# Patient Record
Sex: Female | Born: 1965
Health system: Southern US, Community
[De-identification: ages and names within clinical notes are randomized; demographics above are authoritative.]

## PROBLEM LIST (undated history)

## (undated) DIAGNOSIS — Z8719 Personal history of other diseases of the digestive system: Secondary | ICD-10-CM

## (undated) DIAGNOSIS — Z79891 Long term (current) use of opiate analgesic: Secondary | ICD-10-CM

## (undated) DIAGNOSIS — R490 Dysphonia: Secondary | ICD-10-CM

## (undated) DIAGNOSIS — N390 Urinary tract infection, site not specified: Secondary | ICD-10-CM

## (undated) DIAGNOSIS — F1090 Alcohol use, unspecified, uncomplicated: Secondary | ICD-10-CM

## (undated) DIAGNOSIS — N179 Acute kidney failure, unspecified: Secondary | ICD-10-CM

## (undated) DIAGNOSIS — J9601 Acute respiratory failure with hypoxia: Secondary | ICD-10-CM

## (undated) DIAGNOSIS — N952 Postmenopausal atrophic vaginitis: Secondary | ICD-10-CM

## (undated) DIAGNOSIS — Z789 Other specified health status: Secondary | ICD-10-CM

## (undated) DIAGNOSIS — M542 Cervicalgia: Secondary | ICD-10-CM

## (undated) DIAGNOSIS — M19011 Primary osteoarthritis, right shoulder: Secondary | ICD-10-CM

## (undated) DIAGNOSIS — Z7289 Other problems related to lifestyle: Secondary | ICD-10-CM

## (undated) DIAGNOSIS — F329 Major depressive disorder, single episode, unspecified: Secondary | ICD-10-CM

## (undated) DIAGNOSIS — F1920 Other psychoactive substance dependence, uncomplicated: Secondary | ICD-10-CM

## (undated) DIAGNOSIS — M25521 Pain in right elbow: Secondary | ICD-10-CM

## (undated) DIAGNOSIS — G8929 Other chronic pain: Secondary | ICD-10-CM

## (undated) DIAGNOSIS — M25511 Pain in right shoulder: Secondary | ICD-10-CM

## (undated) DIAGNOSIS — Z981 Arthrodesis status: Secondary | ICD-10-CM

## (undated) DIAGNOSIS — B373 Candidiasis of vulva and vagina: Secondary | ICD-10-CM

## (undated) DIAGNOSIS — R451 Restlessness and agitation: Secondary | ICD-10-CM

## (undated) DIAGNOSIS — I10 Essential (primary) hypertension: Secondary | ICD-10-CM

## (undated) DIAGNOSIS — K631 Perforation of intestine (nontraumatic): Secondary | ICD-10-CM

## (undated) DIAGNOSIS — D62 Acute posthemorrhagic anemia: Secondary | ICD-10-CM

## (undated) DIAGNOSIS — Z9889 Other specified postprocedural states: Secondary | ICD-10-CM

## (undated) DIAGNOSIS — K658 Other peritonitis: Secondary | ICD-10-CM

## (undated) DIAGNOSIS — K750 Abscess of liver: Secondary | ICD-10-CM

## (undated) DIAGNOSIS — Z8619 Personal history of other infectious and parasitic diseases: Secondary | ICD-10-CM

## (undated) DIAGNOSIS — M75121 Complete rotator cuff tear or rupture of right shoulder, not specified as traumatic: Secondary | ICD-10-CM

## (undated) DIAGNOSIS — J690 Pneumonitis due to inhalation of food and vomit: Secondary | ICD-10-CM

## (undated) DIAGNOSIS — R070 Pain in throat: Secondary | ICD-10-CM

## (undated) DIAGNOSIS — Z8709 Personal history of other diseases of the respiratory system: Secondary | ICD-10-CM

## (undated) DIAGNOSIS — M75101 Unspecified rotator cuff tear or rupture of right shoulder, not specified as traumatic: Secondary | ICD-10-CM

## (undated) DIAGNOSIS — R0989 Other specified symptoms and signs involving the circulatory and respiratory systems: Secondary | ICD-10-CM

## (undated) DIAGNOSIS — K219 Gastro-esophageal reflux disease without esophagitis: Secondary | ICD-10-CM

## (undated) DIAGNOSIS — M47812 Spondylosis without myelopathy or radiculopathy, cervical region: Secondary | ICD-10-CM

## (undated) DIAGNOSIS — D649 Anemia, unspecified: Secondary | ICD-10-CM

## (undated) DIAGNOSIS — Z86010 Personal history of colonic polyps: Secondary | ICD-10-CM

## (undated) DIAGNOSIS — R079 Chest pain, unspecified: Secondary | ICD-10-CM

## (undated) DIAGNOSIS — K5790 Diverticulosis of intestine, part unspecified, without perforation or abscess without bleeding: Secondary | ICD-10-CM

## (undated) DIAGNOSIS — M19019 Primary osteoarthritis, unspecified shoulder: Secondary | ICD-10-CM

## (undated) DIAGNOSIS — Z0000 Encounter for general adult medical examination without abnormal findings: Secondary | ICD-10-CM

## (undated) DIAGNOSIS — K626 Ulcer of anus and rectum: Secondary | ICD-10-CM

## (undated) DIAGNOSIS — F32A Depression, unspecified: Secondary | ICD-10-CM

## (undated) DIAGNOSIS — F17200 Nicotine dependence, unspecified, uncomplicated: Secondary | ICD-10-CM

## (undated) DIAGNOSIS — H9190 Unspecified hearing loss, unspecified ear: Secondary | ICD-10-CM

## (undated) HISTORY — DX: Unspecified hearing loss, unspecified ear: H91.90

## (undated) HISTORY — DX: Arthrodesis status: Z98.1

## (undated) HISTORY — DX: Nicotine dependence, unspecified, uncomplicated: F17.200

## (undated) HISTORY — DX: Alcohol use, unspecified, uncomplicated: F10.90

## (undated) HISTORY — DX: Other problems related to lifestyle: Z72.89

## (undated) HISTORY — DX: Complete rotator cuff tear or rupture of right shoulder, not specified as traumatic: M75.121

## (undated) HISTORY — DX: Essential (primary) hypertension: I10

## (undated) HISTORY — DX: Primary osteoarthritis, unspecified shoulder: M19.019

## (undated) HISTORY — DX: Depression, unspecified: F32.A

## (undated) HISTORY — DX: Cervicalgia: M54.2

## (undated) HISTORY — DX: Major depressive disorder, single episode, unspecified: F32.9

## (undated) HISTORY — PX: SHOULDER ARTHROSCOPY: SHX128

## (undated) HISTORY — DX: Unspecified rotator cuff tear or rupture of right shoulder, not specified as traumatic: M75.101

## (undated) HISTORY — DX: Restlessness and agitation: R45.1

## (undated) HISTORY — DX: Chest pain, unspecified: R07.9

## (undated) HISTORY — DX: Other psychoactive substance dependence, uncomplicated: F19.20

## (undated) HISTORY — DX: Gastro-esophageal reflux disease without esophagitis: K21.9

## (undated) HISTORY — DX: Other specified symptoms and signs involving the circulatory and respiratory systems: R09.89

## (undated) HISTORY — DX: Candidiasis of vulva and vagina: B37.3

## (undated) HISTORY — DX: Dysphonia: R49.0

## (undated) HISTORY — DX: Pain in throat: R07.0

## (undated) HISTORY — DX: Primary osteoarthritis, right shoulder: M19.011

## (undated) HISTORY — PX: TUBAL LIGATION: SHX77

## (undated) HISTORY — DX: Encounter for general adult medical examination without abnormal findings: Z00.00

## (undated) HISTORY — DX: Spondylosis without myelopathy or radiculopathy, cervical region: M47.812

## (undated) HISTORY — DX: Long term (current) use of opiate analgesic: Z79.891

## (undated) HISTORY — DX: Pain in right elbow: M25.521

## (undated) HISTORY — DX: Other specified health status: Z78.9

## (undated) HISTORY — DX: Personal history of colonic polyps: Z86.010

## (undated) HISTORY — DX: Urinary tract infection, site not specified: N39.0

## (undated) HISTORY — DX: Postmenopausal atrophic vaginitis: N95.2

## (undated) HISTORY — DX: Other specified postprocedural states: Z98.890

## (undated) HISTORY — DX: Other chronic pain: G89.29

## (undated) HISTORY — DX: Pain in right shoulder: M25.511

---
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
## 2002-04-01 ENCOUNTER — Encounter (INDEPENDENT_AMBULATORY_CARE_PROVIDER_SITE_OTHER): Payer: Self-pay | Admitting: *Deleted

## 2002-04-01 ENCOUNTER — Encounter: Admission: RE | Admit: 2002-04-01 | Discharge: 2002-04-01 | Payer: Self-pay | Admitting: Obstetrics and Gynecology

## 2005-01-24 ENCOUNTER — Encounter (INDEPENDENT_AMBULATORY_CARE_PROVIDER_SITE_OTHER): Payer: Self-pay | Admitting: *Deleted

## 2005-01-24 ENCOUNTER — Ambulatory Visit: Payer: Self-pay | Admitting: Family Medicine

## 2005-01-30 ENCOUNTER — Encounter (INDEPENDENT_AMBULATORY_CARE_PROVIDER_SITE_OTHER): Payer: Self-pay | Admitting: Internal Medicine

## 2005-01-30 LAB — CONVERTED CEMR LAB: Pap Smear: NORMAL

## 2005-05-30 ENCOUNTER — Ambulatory Visit: Payer: Self-pay | Admitting: Obstetrics and Gynecology

## 2005-07-22 ENCOUNTER — Ambulatory Visit: Payer: Self-pay | Admitting: Internal Medicine

## 2005-07-26 ENCOUNTER — Ambulatory Visit (HOSPITAL_COMMUNITY): Admission: RE | Admit: 2005-07-26 | Discharge: 2005-07-26 | Payer: Self-pay

## 2005-08-12 ENCOUNTER — Ambulatory Visit: Payer: Self-pay | Admitting: Internal Medicine

## 2005-10-03 ENCOUNTER — Encounter: Admission: RE | Admit: 2005-10-03 | Discharge: 2005-10-03 | Payer: Self-pay | Admitting: Urology

## 2005-12-16 ENCOUNTER — Ambulatory Visit: Payer: Self-pay | Admitting: Hospitalist

## 2005-12-16 ENCOUNTER — Ambulatory Visit (HOSPITAL_COMMUNITY): Admission: RE | Admit: 2005-12-16 | Discharge: 2005-12-16 | Payer: Self-pay | Admitting: Hospitalist

## 2005-12-27 ENCOUNTER — Ambulatory Visit: Payer: Self-pay | Admitting: Internal Medicine

## 2005-12-30 ENCOUNTER — Ambulatory Visit: Payer: Self-pay | Admitting: Internal Medicine

## 2006-01-02 ENCOUNTER — Ambulatory Visit: Payer: Self-pay | Admitting: Family Medicine

## 2006-01-02 ENCOUNTER — Encounter (INDEPENDENT_AMBULATORY_CARE_PROVIDER_SITE_OTHER): Payer: Self-pay | Admitting: Internal Medicine

## 2006-01-02 ENCOUNTER — Ambulatory Visit (HOSPITAL_COMMUNITY): Admission: RE | Admit: 2006-01-02 | Discharge: 2006-01-02 | Payer: Self-pay | Admitting: Internal Medicine

## 2006-05-30 LAB — HM MAMMOGRAPHY: HM Mammogram: NEGATIVE

## 2006-06-06 ENCOUNTER — Ambulatory Visit: Payer: Self-pay | Admitting: Gynecology

## 2006-06-06 ENCOUNTER — Encounter (INDEPENDENT_AMBULATORY_CARE_PROVIDER_SITE_OTHER): Payer: Self-pay | Admitting: Internal Medicine

## 2006-06-06 ENCOUNTER — Encounter (INDEPENDENT_AMBULATORY_CARE_PROVIDER_SITE_OTHER): Payer: Self-pay | Admitting: Specialist

## 2006-06-06 LAB — CONVERTED CEMR LAB: Pap Smear: NORMAL

## 2006-07-02 ENCOUNTER — Encounter (INDEPENDENT_AMBULATORY_CARE_PROVIDER_SITE_OTHER): Payer: Self-pay | Admitting: Internal Medicine

## 2006-07-02 DIAGNOSIS — F17200 Nicotine dependence, unspecified, uncomplicated: Secondary | ICD-10-CM

## 2008-08-30 ENCOUNTER — Emergency Department (HOSPITAL_COMMUNITY): Admission: EM | Admit: 2008-08-30 | Discharge: 2008-08-30 | Payer: Self-pay | Admitting: Emergency Medicine

## 2009-04-21 ENCOUNTER — Emergency Department (HOSPITAL_COMMUNITY): Admission: EM | Admit: 2009-04-21 | Discharge: 2009-04-21 | Payer: Self-pay | Admitting: Emergency Medicine

## 2009-04-24 ENCOUNTER — Emergency Department (HOSPITAL_COMMUNITY): Admission: EM | Admit: 2009-04-24 | Discharge: 2009-04-24 | Payer: Self-pay | Admitting: Emergency Medicine

## 2009-05-02 ENCOUNTER — Encounter: Payer: Self-pay | Admitting: Internal Medicine

## 2009-05-02 ENCOUNTER — Ambulatory Visit: Payer: Self-pay | Admitting: Infectious Diseases

## 2009-05-02 ENCOUNTER — Ambulatory Visit (HOSPITAL_COMMUNITY): Admission: RE | Admit: 2009-05-02 | Discharge: 2009-05-02 | Payer: Self-pay | Admitting: Infectious Diseases

## 2009-05-02 LAB — CONVERTED CEMR LAB
BUN: 15 mg/dL (ref 6–23)
CO2: 26 meq/L (ref 19–32)
Calcium: 9.5 mg/dL (ref 8.4–10.5)
Chloride: 107 meq/L (ref 96–112)
Hemoglobin: 14.8 g/dL (ref 12.0–15.0)
MCHC: 32.8 g/dL (ref 30.0–36.0)
MCV: 99.3 fL (ref >=78.0)
RBC: 4.54 M/uL (ref 3.87–5.11)
Sodium: 142 meq/L (ref 135–145)
WBC: 5.9 10*3/uL (ref 4.0–10.5)

## 2009-05-04 ENCOUNTER — Ambulatory Visit: Payer: Self-pay | Admitting: Sports Medicine

## 2009-05-09 ENCOUNTER — Telehealth: Payer: Self-pay | Admitting: Sports Medicine

## 2009-05-18 ENCOUNTER — Ambulatory Visit: Payer: Self-pay | Admitting: Internal Medicine

## 2009-05-18 ENCOUNTER — Encounter: Payer: Self-pay | Admitting: Sports Medicine

## 2009-05-31 ENCOUNTER — Encounter: Admission: RE | Admit: 2009-05-31 | Discharge: 2009-06-28 | Payer: Self-pay | Admitting: Sports Medicine

## 2009-06-14 ENCOUNTER — Encounter: Payer: Self-pay | Admitting: Sports Medicine

## 2009-06-28 ENCOUNTER — Encounter: Payer: Self-pay | Admitting: Sports Medicine

## 2009-06-28 ENCOUNTER — Ambulatory Visit: Payer: Self-pay | Admitting: Internal Medicine

## 2009-06-29 ENCOUNTER — Ambulatory Visit: Payer: Self-pay | Admitting: Sports Medicine

## 2009-06-29 DIAGNOSIS — M75101 Unspecified rotator cuff tear or rupture of right shoulder, not specified as traumatic: Secondary | ICD-10-CM | POA: Insufficient documentation

## 2009-06-29 HISTORY — DX: Unspecified rotator cuff tear or rupture of right shoulder, not specified as traumatic: M75.101

## 2009-07-17 ENCOUNTER — Ambulatory Visit: Payer: Self-pay | Admitting: Sports Medicine

## 2009-07-17 DIAGNOSIS — F329 Major depressive disorder, single episode, unspecified: Secondary | ICD-10-CM

## 2009-08-04 ENCOUNTER — Encounter: Payer: Self-pay | Admitting: Sports Medicine

## 2009-09-01 ENCOUNTER — Encounter: Payer: Self-pay | Admitting: Sports Medicine

## 2009-09-14 ENCOUNTER — Encounter
Admission: RE | Admit: 2009-09-14 | Discharge: 2009-09-18 | Payer: Self-pay | Admitting: Physical Medicine & Rehabilitation

## 2009-09-18 ENCOUNTER — Ambulatory Visit: Payer: Self-pay | Admitting: Physical Medicine & Rehabilitation

## 2009-09-26 ENCOUNTER — Ambulatory Visit: Payer: Self-pay | Admitting: Sports Medicine

## 2009-09-26 ENCOUNTER — Encounter (INDEPENDENT_AMBULATORY_CARE_PROVIDER_SITE_OTHER): Payer: Self-pay | Admitting: *Deleted

## 2009-10-23 ENCOUNTER — Ambulatory Visit: Payer: Self-pay | Admitting: Family Medicine

## 2009-11-07 ENCOUNTER — Ambulatory Visit: Payer: Self-pay | Admitting: Sports Medicine

## 2009-12-06 ENCOUNTER — Ambulatory Visit: Payer: Self-pay | Admitting: Sports Medicine

## 2010-01-09 ENCOUNTER — Ambulatory Visit: Payer: Self-pay | Admitting: Sports Medicine

## 2010-01-30 ENCOUNTER — Emergency Department (HOSPITAL_COMMUNITY): Admission: EM | Admit: 2010-01-30 | Discharge: 2010-01-30 | Payer: Self-pay | Admitting: Emergency Medicine

## 2010-02-20 ENCOUNTER — Ambulatory Visit: Payer: Self-pay | Admitting: Sports Medicine

## 2010-03-09 ENCOUNTER — Ambulatory Visit: Payer: Self-pay | Admitting: Internal Medicine

## 2010-03-12 ENCOUNTER — Ambulatory Visit: Payer: Self-pay | Admitting: Sports Medicine

## 2010-03-14 ENCOUNTER — Ambulatory Visit (HOSPITAL_COMMUNITY): Admission: RE | Admit: 2010-03-14 | Discharge: 2010-03-14 | Payer: Self-pay | Admitting: Internal Medicine

## 2010-03-16 ENCOUNTER — Telehealth: Payer: Self-pay | Admitting: *Deleted

## 2010-03-21 ENCOUNTER — Ambulatory Visit: Payer: Self-pay | Admitting: Internal Medicine

## 2010-03-23 ENCOUNTER — Telehealth: Payer: Self-pay | Admitting: *Deleted

## 2010-03-26 ENCOUNTER — Telehealth: Payer: Self-pay | Admitting: Internal Medicine

## 2010-04-03 ENCOUNTER — Telehealth: Payer: Self-pay | Admitting: *Deleted

## 2010-04-05 ENCOUNTER — Telehealth: Payer: Self-pay | Admitting: Internal Medicine

## 2010-04-09 ENCOUNTER — Ambulatory Visit: Payer: Self-pay | Admitting: Internal Medicine

## 2010-04-09 ENCOUNTER — Encounter: Payer: Self-pay | Admitting: Internal Medicine

## 2010-04-17 ENCOUNTER — Telehealth: Payer: Self-pay | Admitting: Internal Medicine

## 2010-04-18 ENCOUNTER — Ambulatory Visit: Payer: Self-pay | Admitting: Internal Medicine

## 2010-04-27 ENCOUNTER — Telehealth: Payer: Self-pay | Admitting: Internal Medicine

## 2010-05-01 ENCOUNTER — Ambulatory Visit: Payer: Self-pay | Admitting: Sports Medicine

## 2010-05-07 ENCOUNTER — Ambulatory Visit: Payer: Self-pay | Admitting: Internal Medicine

## 2010-05-11 ENCOUNTER — Ambulatory Visit: Payer: Self-pay | Admitting: Sports Medicine

## 2010-05-23 ENCOUNTER — Encounter (INDEPENDENT_AMBULATORY_CARE_PROVIDER_SITE_OTHER): Payer: Self-pay | Admitting: *Deleted

## 2010-05-28 ENCOUNTER — Encounter: Payer: Self-pay | Admitting: Internal Medicine

## 2010-05-31 ENCOUNTER — Ambulatory Visit: Payer: Self-pay | Admitting: Sports Medicine

## 2010-06-06 ENCOUNTER — Telehealth: Payer: Self-pay | Admitting: Internal Medicine

## 2010-06-11 ENCOUNTER — Ambulatory Visit: Payer: Self-pay | Admitting: Internal Medicine

## 2010-06-15 ENCOUNTER — Telehealth: Payer: Self-pay | Admitting: Licensed Clinical Social Worker

## 2010-06-26 ENCOUNTER — Encounter: Payer: Self-pay | Admitting: Internal Medicine

## 2010-07-04 ENCOUNTER — Telehealth: Payer: Self-pay | Admitting: Licensed Clinical Social Worker

## 2010-07-13 ENCOUNTER — Ambulatory Visit: Payer: Self-pay | Admitting: Internal Medicine

## 2010-07-13 ENCOUNTER — Encounter: Payer: Self-pay | Admitting: Sports Medicine

## 2010-07-13 ENCOUNTER — Encounter: Payer: Self-pay | Admitting: Internal Medicine

## 2010-07-13 LAB — CONVERTED CEMR LAB
AST: 21 units/L (ref 0–37)
BUN: 20 mg/dL (ref 6–23)
Basophils Relative: 0 % (ref 0–1)
CO2: 26 meq/L (ref 19–32)
Chloride: 105 meq/L (ref 96–112)
Cholesterol: 191 mg/dL (ref 0–200)
Creatinine, Ser: 1.04 mg/dL (ref 0.40–1.20)
HDL: 88 mg/dL (ref >39)
Hemoglobin: 13.2 g/dL (ref 12.0–15.0)
Lymphocytes Relative: 39 % (ref 12–46)
Lymphs Abs: 2.6 10*3/uL (ref 0.7–4.0)
Monocytes Absolute: 0.5 10*3/uL (ref 0.1–1.0)
Monocytes Relative: 8 % (ref 3–12)
Neutro Abs: 3.5 10*3/uL (ref 1.7–7.7)
Potassium: 4.6 meq/L (ref 3.5–5.3)
RBC: 3.94 M/uL (ref 3.87–5.11)
RDW: 14.5 % (ref 11.5–15.5)
TSH: 1.84 microintl units/mL (ref 0.350–4.5)
Total CHOL/HDL Ratio: 2.2
Triglycerides: 47 mg/dL (ref <150)

## 2010-07-16 ENCOUNTER — Encounter: Payer: Self-pay | Admitting: Internal Medicine

## 2010-07-20 ENCOUNTER — Emergency Department (HOSPITAL_COMMUNITY): Admission: EM | Admit: 2010-07-20 | Discharge: 2010-07-20 | Payer: Self-pay | Admitting: Emergency Medicine

## 2010-07-24 ENCOUNTER — Encounter: Payer: Self-pay | Admitting: Internal Medicine

## 2010-07-31 ENCOUNTER — Telehealth: Payer: Self-pay | Admitting: Internal Medicine

## 2010-08-02 ENCOUNTER — Encounter: Payer: Self-pay | Admitting: Internal Medicine

## 2010-08-06 ENCOUNTER — Encounter: Payer: Self-pay | Admitting: Internal Medicine

## 2010-08-06 ENCOUNTER — Ambulatory Visit: Payer: Self-pay | Admitting: Internal Medicine

## 2010-08-09 ENCOUNTER — Encounter: Payer: Self-pay | Admitting: Internal Medicine

## 2010-08-13 ENCOUNTER — Encounter: Payer: Self-pay | Admitting: Internal Medicine

## 2010-08-21 ENCOUNTER — Telehealth: Payer: Self-pay | Admitting: Internal Medicine

## 2010-08-26 HISTORY — PX: CERVICAL DISCECTOMY: SHX98

## 2010-08-27 ENCOUNTER — Ambulatory Visit: Payer: Self-pay | Admitting: Internal Medicine

## 2010-09-04 ENCOUNTER — Encounter: Payer: Self-pay | Admitting: Internal Medicine

## 2010-09-12 ENCOUNTER — Encounter: Payer: Self-pay | Admitting: Internal Medicine

## 2010-09-15 ENCOUNTER — Encounter: Payer: Self-pay | Admitting: Gastroenterology

## 2010-09-16 ENCOUNTER — Encounter: Payer: Self-pay | Admitting: Infectious Diseases

## 2010-09-16 ENCOUNTER — Encounter: Payer: Self-pay | Admitting: Internal Medicine

## 2010-09-16 ENCOUNTER — Encounter: Payer: Self-pay | Admitting: Urology

## 2010-09-25 NOTE — Assessment & Plan Note (Signed)
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  note pain down to mid portion of periscapulr is now absent and RT arm not really painful  Allergies: No Known Drug Allergies  Physical Exam  General:  looks stressed and somewhat depressed not comfortable in collar Msk:  hands are both slt puffy w mild chronic djd changes in DIP jts pressure on left carpal tunnel does cause tingling in mid fingers  MSK [place] LT median nerve is 13 cm2 area whereas that on RT is about 10 cm2 in same area  trans and long scan both suggest changes in left median nerve   Impression & Recommendations:  Problem # 1:  WRIST PAIN, LEFT (ICD-719.43)  Orders: Thumbkeeper/wrist brace (W1191) [place] LIMITED ([id number]) Trigger Point Injection Single Tendon Origin/Insertion (56213)  will start using night splint to lessen carp tunn sxs  Problem # 2:  CARPAL TUNNEL SYNDROME, LEFT (ICD-354.0)  cleanse with alcohol Topical analgesic spray : Ethyl choride Joint left wrist Approached in typical fashion with: median wrist crese and directed under palmar ligament Completed without difficulty Meds:0.4cc kenalog 10 and 0.4 cc lidocaine Needle: 25 g and 1.5 in Aftercare instructions and Red flags advised  she meets [place] criteria for diagnosis of carpal tunnel based on median n volume wil ck to see if this approach has benefit and provide pain relief  if so may wish to keep using nite splints for  several mos  reck pending MRI findings  Orders: [place] LIMITED ([id number]) Trigger Point Injection Single Tendon Origin/Insertion (84696) Kenalog 10 mg inj (J3301)  Complete Medication List: 1)  Tramadol Hcl 50 Mg Tabs (Tramadol hcl) .... Take 1 tablet every 6 hour as needed. 2)  Gabapentin 800 Mg Tabs (Gabapentin) .Marland Kitchen.. 1 tab by mouth three times a day 3)  Amitriptyline Hcl 50 Mg Tabs (Amitriptyline hcl) .... 2 by mouth at bedtime 4)  Tramadol Hcl 50 Mg Tabs (Tramadol hcl) .... One to two po qid 5)  Doxycycline Hyclate 100 Mg Caps (Doxycycline hyclate) .... Take 1 tablet by mouth two times a day 6)  Bupropion Hcl 100 Mg Tabs (Bupropion hcl) .... Start with 1 tab two times a day and after 3 days take 1 tab three times a day
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
## 2010-09-25 NOTE — Assessment & Plan Note (Signed)
Summary: F/U,MC   Vital Signs:  Patient profile:   45 year old female BP sitting:   130 / 91  Vitals Entered By: [name] CMA (December 06, 2009 8:48 AM)  Primary Provider:  Lars Mage MD   History of Present Illness: Pain pattern starts at Trap and moves around shoulder blade Tens unit does help wears most days and uses as needed  Morning takes tramadol and gabapentin Takes amitriptyline and diazepam at night during day you use tramadol and gabapentin about every 4 hours total of 3 gabapentin  feels strength difference to RT arm anything more than 10 lbs causes significant pain  Allergies: No Known Drug Allergies  Physical Exam  General:  Well-developed,well-nourished,in no acute distress; alert,appropriate and cooperative throughout examination  seems in less pain than usual Msk:  RT shoulder only mild trap spasm today good ROM in abductiona nd elevation although she feels this at extremes this is normal ER is normal but uncomfortable with elevation  IR is limited onbackscratch and cross over  strenth is actually good in all planes but limited in IR 2/2 creating pain  neg impingement   Impression & Recommendations:  Problem # 1:  SHOULDER PAIN, RIGHT (ICD-719.41) Assessment Improved  The following medications were removed from the medication list:    Mobic 15 Mg Tabs (Meloxicam) .Marland Kitchen... Take one qam with food Her updated medication list for this problem includes:    Tramadol Hcl 50 Mg Tabs (Tramadol hcl) .Marland Kitchen... Take 1 tablet every 6 hour as needed.    Tramadol Hcl 50 Mg Tabs (Tramadol hcl) ..... One to two po qid   pain still gets to 7/10 at times but overall level is 3 to 4 most time this is better but still problematic  Problem # 2:  INJURY TO BRACHIAL PLEXUS (ICD-953.4) Assessment: Unchanged no nerve deficits noted x scapular dysfunction on RT  cont to follow but wean gabapentin  Problem # 3:  DEPRESSIVE DISORDER NOT ELSEWHERE CLASSIFIED  (ICD-311)  Her updated medication list for this problem includes:    Amitriptyline Hcl 50 Mg Tabs (Amitriptyline hcl) .Marland Kitchen... 2 by mouth at bedtime    Diazepam 5 Mg Tabs (Diazepam) ..... One tab three times a day po  I think meds are helping make this some better as sleep pattern has improved  will use diazepam but primarily as muscle relaxant  Complete Medication List: 1)  Tramadol Hcl 50 Mg Tabs (Tramadol hcl) .... Take 1 tablet every 6 hour as needed. 2)  Gabapentin 600 Mg Tabs (Gabapentin) .... One by mouth three times a day 3)  Amitriptyline Hcl 50 Mg Tabs (Amitriptyline hcl) .... 2 by mouth at bedtime 4)  Tramadol Hcl 50 Mg Tabs (Tramadol hcl) .... One to two po qid 5)  Diazepam 5 Mg Tabs (Diazepam) .... One tab three times a day po  Patient Instructions: 1)  Week one take: 2)  Diazepam 5 mgm morning and night 3)  Gabapentin one in mid day and night 4)  Week two 5)  Diazepam three times a day 6)  gabapentin just at night 7)  Weeks 3 and 4 8)  Diazepam three times a day 9)  Gabapentin every other day for 3 doses then stop 10)  stay on this and see me in 6 weeks Prescriptions: DIAZEPAM 5 MG TABS (DIAZEPAM) One tab three times a day po  #90 x 2   Entered and Authorized by:   Enid Baas MD   Signed by:  Enid Baas MD on 12/06/2009   Method used:   Print then Give to Patient   RxID:   [id number]

## 2010-09-25 NOTE — Assessment & Plan Note (Signed)
Summary: F/U STUDY,MC   Vital Signs:  Patient profile:   45 year old female Pulse rate:   64 / minute BP sitting:   141 / 84  Vitals Entered By: [name] CMA (September 26, 2009 10:22 AM)  Primary Provider:  Lars Mage MD   History of Present Illness: Patient comes in with gradual reduction in pain For a long time pain severity was 8 to 9 of 10 now after therapy and medicine averages 6 of 10 sleepiong thru nite since increased dose of amitriptyline tramdol usually helpful but often needs 2 of 50 mg tabs gabapentin helpful but very expensive for her  now getting better movement of RT arm Evaluation by PMR did not show any nerve damage to brachial plexus However, mechanism of injury and persistence of sxs still make this a possibility with secondary mm spasm the result interstingly had bilat carpal tunnel suggested by median nerve results but does not have sxs of hand pain or numbness PMR suggested a myofascial origin of pain  Continues to work exercises given by PT and the motion exercises that I added  Allergies: No Known Drug Allergies  Physical Exam  General:  Well-developed,well-nourished,in no acute distress; alert,appropriate and cooperative throughout examination Msk:  Moderate trapezius and infraspinatus spasm on RT that is not as prominent RT shoulder abduciton and elevation now to 150 deg and I can passively get 165 vs 180 on left  RT backscratch now to about T8 level was at T 10   no atrophy good overall strength  augmented phalen's test on both wrists does not produce any numbness or tingling in hands   Impression & Recommendations:  Problem # 1:  SHOULDER PAIN, RIGHT (ICD-719.41)  The following medications were removed from the medication list:    Tramadol Hcl 100 Mg Xr24h-tab (Tramadol hcl) .Marland Kitchen... Take one tab three times a day Her updated medication list for this problem includes:        Tramadol Hcl 50 Mg Tabs (Tramadol hcl) ..... One to two po  qid   cleanse with alcohol Topical analgesic spray : Ethyl choride Local injection along infraspinatus MM and along lower trapezius Approached in typical fashion and injected 5 separate trigger areas Completed without difficulty Meds:1% xylocaine 5 ccs Needle:25 G and 3/4 in Aftercare instructions and Red flags advised   now able to lift 8 to 10 lbs  reck in 6 wks  Orders: Trigger Point Injection (1 or 2 muscles) (16109)  Problem # 2:  INJURY TO BRACHIAL PLEXUS (ICD-953.4) suspect this was a neurapraxia that has resolved by time of NCV Muscle spasm may be residual  keep up gabapentin 300 to 600 tid  Problem # 3:  DEPRESSIVE DISORDER NOT ELSEWHERE CLASSIFIED (ICD-311)  Her updated medication list for this problem includes:    Amitriptyline Hcl 50 Mg Tabs (Amitriptyline hcl) .Marland Kitchen... 1 by mouth at bedtime x 1 week then 2 by mouth at bedtime  cont amitriptyline she will prob feel better if she can return to light duty work and letter given to this effect  Complete Medication List: 1)  Tramadol Hcl 50 Mg Tabs (Tramadol hcl) .... Take 1 tablet every 6 hour as needed. 2)  Gabapentin 600 Mg Tabs (Gabapentin) .... One by mouth three times a day 3)  Mobic 15 Mg Tabs (Meloxicam) .... Take one qam with food 4)  Amitriptyline Hcl 50 Mg Tabs (Amitriptyline hcl) .Marland Kitchen.. 1 by mouth at bedtime x 1 week then 2 by mouth qhs  5)  Tramadol Hcl 50 Mg Tabs (Tramadol hcl) .... One to two po qid Prescriptions: GABAPENTIN 600 MG TABS (GABAPENTIN) ONE by mouth three times a day  #90 x 3   Entered by:   [name] CMA   Authorized by:   Enid Baas MD   Signed by:   [name] CMA on 09/26/2009   Method used:   Print then Give to Patient   RxID:   [id number] TRAMADOL HCL 50 MG TABS (TRAMADOL HCL) ONE TO TWO PO QID  #100 x 3   Entered by:   [name] CMA   Authorized by:   Enid Baas MD   Signed by:   [name] CMA on 09/26/2009   Method used:   Print then Give to Patient   RxID:    [id number] GABAPENTIN 600 MG TABS (GABAPENTIN) ONE by mouth three times a day  #90 x 3   Entered by:   [name] CMA   Authorized by:   Enid Baas MD   Signed by:   [name] CMA on 09/26/2009   Method used:   Electronically to        [facility] (retail)       [address]       [place], [place]  [address]       Ph: [phone]       Fax: [phone]   RxID:   [id number]
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
## 2010-09-25 NOTE — Progress Notes (Signed)
Summary: med refil/gp  Phone Note Refill Request Message from:  Fax from Pharmacy on June 06, 2010 9:58 AM  Refills Requested: Medication #1:  SERTRALINE HCL 25 MG TABS Take 1 tablet by mouth once a day at night.   Dosage confirmed as above?Dosage Confirmed   Brand Name Necessary? No   Supply Requested: 3 months   Last Refilled: 05/07/2010  Method Requested: Electronic Initial call taken by: Chinita Pester RN,  June 06, 2010 9:59 AM  Follow-up for Phone Call        Refill approved and sent electronically to her pharmacy.  Thank you Follow-up by: Lars Mage MD,  June 06, 2010 1:49 PM    Prescriptions: SERTRALINE HCL 25 MG TABS (SERTRALINE HCL) Take 1 tablet by mouth once a day at night  #30 x 3   Entered and Authorized by:   Lars Mage MD   Signed by:   Lars Mage MD on 06/06/2010   Method used:   Electronically to        [facility] (retail)       [address]       [place], [place]  [address]       Ph: [phone]       Fax: [phone]   RxID:   [id number]
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
## 2010-09-25 NOTE — Miscellaneous (Signed)
Summary: PROTECTED HEALTH INFORMATION  PROTECTED HEALTH INFORMATION   Imported By: Margie Billet 05/10/2010 12:28:51  _____________________________________________________________________  External Attachment:    Type:   Image     Comment:   External Document

## 2010-09-25 NOTE — Assessment & Plan Note (Signed)
Summary: FU APPT/MJD   Vital Signs:  Patient profile:   45 year old female BP sitting:   120 / 70  Vitals Entered By: [name] CMA (Jan 09, 2010 8:52 AM)  Primary Provider:  Lars Mage MD   History of Present Illness: MEDS We have adjusted to following and these have taken burning our of shoulder area Gabapentin and tramadol in morning and takes this every 4 hours during day diazepam 5 on same schedule  amitriptyline 1 at nite adn 2 on weekend 2 makes her too drowsy  keep problem is contintued problems into left arm sometimes just to thumb dangling forward or inversion table relieves this for about 1 hour  Allergies: No Known Drug Allergies  Physical Exam  General:  Well-developed,well-nourished,in no acute distress; alert,appropriate and cooperative throughout examination  note she has good sitting and standing posture Neck:  full ROM wiothout pain Msk:  has left trap spasm  Bilat Shoulder Inspection reveals no abnormalities or assymetry; no atrophy noted; palpation is unremarkable;  ROM is full in all planes. specific strength testing of Rotator cuff mm reveals good strength throughout; no signs of impingement; speeds and yergason's tests normal;  no labral pathology noted; norm scapular function observed.  negative painful arc and no drop arm sign.  Neurologic:  no neuro deficits noted although she feels tingling into left arm on testing   Impression & Recommendations:  Problem # 1:  INJURY TO BRACHIAL PLEXUS (ICD-953.4) Still feel that this is primary trigger for her sxs as we do not identify any cervical spine or intrinsic shoulder injury on exam  see isntrucitons  will work with her to find ways to relieve nerve impingement sxs  Problem # 2:  BACK PAIN, THORACIC REGION (ICD-724.1)  Her updated medication list for this problem includes:    Tramadol Hcl 50 Mg Tabs (Tramadol hcl) .Marland Kitchen... Take 1 tablet every 6 hour as needed.    Tramadol Hcl 50 Mg Tabs  (Tramadol hcl) ..... One to two po qid  This is improving and will cont on meds as now  Complete Medication List: 1)  Tramadol Hcl 50 Mg Tabs (Tramadol hcl) .... Take 1 tablet every 6 hour as needed. 2)  Gabapentin 600 Mg Tabs (Gabapentin) .... One by mouth three times a day 3)  Amitriptyline Hcl 50 Mg Tabs (Amitriptyline hcl) .... 2 by mouth at bedtime 4)  Tramadol Hcl 50 Mg Tabs (Tramadol hcl) .... One to two po qid 5)  Diazepam 5 Mg Tabs (Diazepam) .... One tab three times a day po  Other Orders: Cervical Foam Collar Non Adjustable ([id number])  Patient Instructions: 1)  Let's try putting collar on for 1 hour at a time when you first feel tingling starting in left arm 2)  every 3 to four hours try this exercise - lean forward and move arm quickly back and forth 3)  Rt arm keep pushing up behind back 4)  leave meds as is for now 5)  reck with me in 2 mos or sooner if needed
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
## 2010-09-25 NOTE — Letter (Signed)
Summary: Out of Work  [facility]  [address]   [place], [place] [address]   Phone: [phone]  Fax: [phone]    September 26, 2009   Employee:  Janann Colonel    To Whom It May Concern:   For Medical reasons, please excuse the above named employee from lifting over 10 lbs while at work. If you need additional information, please feel free to contact our office.         Sincerely,    Sibyl Parr. Darrick Penna, M.D.
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
## 2010-09-25 NOTE — Letter (Signed)
Summary: [facility] [employer]  [facility] [employer]   Imported By: Margie Billet 07/27/2010 15:43:41  _____________________________________________________________________  External Attachment:    Type:   Image     Comment:   External Document
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
## 2010-09-25 NOTE — Progress Notes (Signed)
  Phone Note Outgoing Call   Call placed by: Theotis Barrio NT II,  April 03, 2010 4:54 PM Call placed to: Specialist Details for Reason: PT REFERRAL Summary of Call: SPOKE WITH Bradly Bienenstock AT PT OFFICE, TO FOLLOW UP ON THE REFERRAL FAXED ON 7/20/011 . KISHA SAID THE OFFICE LEFT A VOICE MESSAGE FOR PATIENT TO CALL THE OFFICE TO SET UP THIS APPT. PATIEINT NEVER CALLED THEIR OFFICE TO MAKE THIS APPT.
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
## 2010-09-25 NOTE — Progress Notes (Signed)
Summary: referral/ hla  Phone Note Call from Patient   Summary of Call: pt called to check on progress of her referral, she states she was told she would hear from "them" today and has not, i spoke w/ spowers and [place] and informed pt again that it would be monday at the earliest Initial call taken by: Marin Roberts RN,  March 23, 2010 4:33 PM

## 2010-09-25 NOTE — Progress Notes (Signed)
Summary: call for results/ hla  Phone Note Call from Patient   Summary of Call: pt called for mri results, transferred to kaye/ dr garg Initial call taken by: Marin Roberts RN,  March 16, 2010 3:55 PM     Appended Document: call for results/ hla Talked to Julie Williams about her MRI results. Told her that we have already started working on the Neurosurgery referral and sent all her information to [facility] and are waiting to hear back from them. She has an appointment with [place] on coming wednesday and further plan of action will be discussed that day.  Appended Document: call for results/ hla I left message noting she may have to call you for some adjustment in pain medications and to help with the disk.  she may want to try using the cervical collar I gave her again to see if this gives her some pain relief.       KBF

## 2010-09-25 NOTE — Consult Note (Signed)
Summary: [facility]   Imported By: Louretta Parma 07/02/2010 14:17:55  _____________________________________________________________________  External Attachment:    Type:   Image     Comment:   External Document
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
## 2010-09-25 NOTE — Progress Notes (Signed)
Summary: Shoulder pain  Phone Note Call from Patient   Caller: Patient Call For: Lars Mage MD Summary of Call: Call from pt said that she had a different type of pain in her shoulder.  York Spaniel that it felt like a shocking pain that went from her left shoulder tot her left arm.  The pain lasted about 15 minutes.   Wanted Dr. Eben Burow to know about this.  RTC to pt message left that the Clinics had returned her call. Initial call taken by: Angelina Ok RN,  March 26, 2010 11:02 AM  Follow-up for Phone Call        RTC from pt still having numbness in her hand.  Was referred to [facility] for this.  Wants to know status of.  Ongoing numbness and tingling.  Coming from bolts in her neck.    Additional Follow-up for Phone Call Additional follow up Details #1::        Patient was seen on Thursday last week and di not have any danger signs s/o nerve compression. We are still waiting for reply from [facility]. I have asked Purnell Shoemaker to look into the referral and call Ms Bernard to update. If the pain is unbearable, we should make an appointment for assesment for pain control Vs Admission. Additional Follow-up by: Lars Mage MD,  March 26, 2010 1:39 PM    Additional Follow-up for Phone Call Additional follow up Details #2::    F/u call made to [facility]  [phone], no appt has been scheduled at time.  Pt's information is on Michelle's desk who is in the process of making pt an appt. Message left on pt's recorder.Cynda Familia [facility])  March 26, 2010 3:41 PM  Follow-up by: Lars Mage MD,  March 26, 2010 4:24 PM  Additional Follow-up for Phone Call Additional follow up Details #3:: Details for Additional Follow-up Action Taken: Thank you Purnell Shoemaker. Additional Follow-up by: Lars Mage MD,  March 26, 2010 4:24 PM

## 2010-09-25 NOTE — Miscellaneous (Signed)
Summary: Phone conversation with [facility] Neurosurgery  Called up Dr Cochrane who is chief neurosurgical resident at [facility]. He took care of Raysa Bosak in the clinic when she had her appointment with them. Discussing about the mental health of Ms Schlichting with him he mentioned several instances where ms Gupta would intensionally interfere with the medical work flow. He mentioned about she giving them hard time about her images from [facility] hospital even after being told several times that they would be required fro planning her surgery. He also mentioned about the fact that she emailed him a letter full of inappropriate commenst about the medical staff working at neurosurgery clinic. Finally he mentioned that she has this magical thinking that she is going to be compltely pain free and asymptomatic after the surgery which is complately untrue even after several discussions with her. Dr Luz Brazen and Dr Cochrane are both in agreement that patinet requires a psychiatric consult before they can decide whether to go ahead with the surgery. They fear that her magical expectations from the surgery are not going to be met and fear putting her into a risk of further psychiatric and medical complications if they proceed with it. He added that he would happy to see her back once psych consult is done. He also added that inpatient admission with neurosurgical consult would not be unreasonable.

## 2010-09-25 NOTE — Progress Notes (Signed)
  Phone Note Outgoing Call   Summary of Call: Left message for Ms. Barrantes to contact social work.   Follow-up for Phone Call        Thank you  Ankit  Left another message for Ms. Yetta Barre today to call SW Dorothe Pea  July 03, 2010 11:33 AM  Follow-up by: Lars Mage MD,  July 02, 2010 5:19 PM

## 2010-09-25 NOTE — Assessment & Plan Note (Signed)
Summary: ACUTE-LEG CRAMP/CFB(GARG)   Vital Signs:  Patient profile:   45 year old female Height:      66 inches (167.64 cm) Weight:      120.7 pounds (54.86 kg) BMI:     19.55 Temp:     97.9 degrees F (36.61 degrees C) oral Pulse rate:   65 / minute BP sitting:   134 / 94  (left arm)  Vitals Entered By: Stanton Kidney Ditzler RN (July 13, 2010 9:41 AM) Is Patient Diabetic? No Pain Assessment Patient in pain? yes     Location: right calf and foot Intensity: 8.5 Type: sharp Onset of pain  since 06/26/10 Nutritional Status BMI of 19 -24 = normal Nutritional Status Detail appetite better  Have you ever been in a relationship where you felt threatened, hurt or afraid?denies   Does patient need assistance? Functional Status Self care Ambulation Normal Comments Neck in soft brace. Since being seen at [facility] - meds changed severe pain right calf and foot.   Primary Care Provider:  Lars Mage MD   History of Present Illness: 45 y/o woman with PMH significant for depression,  cervical spondylosis comes to the clinic today for f/u visit.  She reports having cramps in her legs after she was started on new med( Baclofen) by the neurosurgeon at the [facility] , whom she saw on 1st Nov. She describes them occuring every 2-3 nights, lasts for about 30-40 mins. She sometimes takes aleve which makkes them better.  She is very frustated from our care. She appreciates Dr. Sumner Boast care and  referring her to Neurosurgeon but at the same time she is upset from [place], as it took [place] so long for that referral.   She also complains of right scapular pain for 15 months for which she has tried PT, hot/ cold compresses and is being f/b Dr. Darrick Penna for that, but nothing is helping her.  She did not allow me to examine her today.  Depression History:      The patient denies a depressed mood most of the day and a diminished interest in her usual daily activities.         Preventive Screening-Counseling  & Management  Alcohol-Tobacco     Smoking Status: current     Smoking Cessation Counseling: yes     Packs/Day: 1 pp week  Caffeine-Diet-Exercise     Does Patient Exercise: yes     Type of exercise: TREDMILL     Exercise (avg: min/session): 1 HOUR     Times/week:   7  Current Medications (verified): 1)  Tramadol Hcl 50 Mg Tabs (Tramadol Hcl) .... One To Two Po Qid 2)  Trazodone Hcl 150 Mg Tabs (Trazodone Hcl) .... Take 1/3 Tab By Mouth Q Hs X 1wk, Then 2/3 Tab Q Hs X 1 Wk, Then 1 Tab Q Hs Thereafter 3)  Sertraline Hcl 50 Mg Tabs (Sertraline Hcl) .... Take 1 Tablet By Mouth Once A Day At Night  Allergies (verified): No Known Drug Allergies  Social History: Packs/Day:  1 pp week  Review of Systems      See HPI  The patient denies anorexia, fever, chest pain, syncope, dyspnea on exertion, peripheral edema, prolonged cough, and abdominal pain.    Physical Exam  General:  alert, well-developed, and well-nourished, wearing a neck collar.  She did not let me examine her today.   Impression & Recommendations:  Problem # 1:  LEG CRAMPS, NOCTURNAL (ICD-729.82) Assessment New She reports leg cramps  occuring every 2nd-3 rd night lasting for about 30 -40 mins. She apparantly relates their onset to the new med( Baclofen) that she was started on. She was explained that they are less likely to be related to that med advere effect. Will rule out other possible causes like , hypokalemia, hypocalcemia, hypothyroidism and  low ferritin levels. Orders: T-Comprehensive Metabolic Panel [phone]) T-Lipid Profile [phone]) T-Ferritin [id number]) T-CBC w/Diff [id number]) T-TSH [phone])  Problem # 2:  Preventive Health Care (ICD-V70.0) She request her lipids to be checked as it has been a while she got them checked. Wil check her Lipid panel today.  Problem # 3:  HYPERTENSION, BENIGN ESSENTIAL (ICD-401.1) Assessment: Deteriorated Her BP today is 134/91, which is high from  previous visits. Could be related to her anxiety, apprehension and frustation. Will reasses with the next visit before making any changes in her regimen. Continue current regimen.  Complete Medication List: 1)  Tramadol Hcl 50 Mg Tabs (Tramadol hcl) .... One to two po qid 2)  Trazodone Hcl 150 Mg Tabs (Trazodone hcl) .... Take 1/3 tab by mouth q hs x 1wk, then 2/3 tab q hs x 1 wk, then 1 tab q hs thereafter 3)  Sertraline Hcl 50 Mg Tabs (Sertraline hcl) .... Take 1 tablet by mouth once a day at night  Patient Instructions: 1)  Please schedule a follow-up appointment in 2 months. 2)  Please take your medicines as prescribed. Prescriptions: SERTRALINE HCL 50 MG TABS (SERTRALINE HCL) Take 1 tablet by mouth once a day at night  #30 x 1   Entered and Authorized by:   Elyse Jarvis   Signed by:   Elyse Jarvis on 07/13/2010   Method used:   Print then Give to Patient   RxID:   [id number] TRAZODONE HCL 150 MG TABS (TRAZODONE HCL) take 1/3 tab by mouth q hs x 1wk, then 2/3 tab q hs x 1 wk, then 1 tab q hs thereafter  #30 x 3   Entered and Authorized by:   Elyse Jarvis   Signed by:   Elyse Jarvis on 07/13/2010   Method used:   Print then Give to Patient   RxID:   [id number] TRAMADOL HCL 50 MG TABS (TRAMADOL HCL) ONE TO TWO PO QID  #100 Each x 1   Entered and Authorized by:   Elyse Jarvis   Signed by:   Elyse Jarvis on 07/13/2010   Method used:   Print then Give to Patient   RxID:   [id number]    Orders Added: 1)  T-Comprehensive Metabolic Panel [[id number]] 2)  T-Lipid Profile [[id number]] 3)  T-Ferritin [[id number]] 4)  T-CBC w/Diff [[id number]] 5)  T-TSH [[id number]]    Prevention & Chronic Care Immunizations   Influenza vaccine: Fluvax Non-MCR  (06/11/2010)   Influenza vaccine deferral: Deferred  (03/09/2010)    Tetanus booster: Not documented   Td booster deferral: Deferred  (03/09/2010)    Pneumococcal vaccine: Not documented  Other Screening    Pap smear: Normal  (06/06/2006)   Pap smear action/deferral: Deferred  (06/11/2010)    Mammogram: Normal  (05/30/2006)   Mammogram action/deferral: Deferred  (06/11/2010)   Smoking status: current  (07/13/2010)   Smoking cessation counseling: yes  (07/13/2010)  Lipids   Total Cholesterol: Not documented   Lipid panel action/deferral: Deferred   LDL: Not documented   LDL Direct: Not documented   HDL: Not documented   Triglycerides: Not documented  Hypertension   Last Blood Pressure: 134 / 94  (07/13/2010)  Serum creatinine: 0.79  (05/02/2009)   BMP action: Deferred   Serum potassium 4.4  (05/02/2009) CMP ordered     Hypertension flowsheet reviewed?: Yes   Progress toward BP goal: Deteriorated  Self-Management Support :   Personal Goals (by the next clinic visit) :      Personal blood pressure goal: 130/80  (03/09/2010)   Patient will work on the following items until the next clinic visit to reach self-care goals:     Medications and monitoring: take my medicines every day, check my blood pressure, bring all of my medications to every visit, weigh myself weekly  (07/13/2010)     Eating: drink diet soda or water instead of juice or soda, eat more vegetables, use fresh or frozen vegetables, eat foods that are low in salt, eat baked foods instead of fried foods, eat fruit for snacks and desserts  (07/13/2010)     Activity: take a 30 minute walk every day, park at the far end of the parking lot  (07/13/2010)    Hypertension self-management support: Written self-care plan, Education handout, Resources for patients handout  (07/13/2010)   Hypertension self-care plan printed.   Hypertension education handout printed      Resource handout printed.  Process Orders Check Orders Results:     [facility]: ABN not required for this insurance Tests Sent for requisitioning (July 16, 2010 9:18 AM):     07/13/2010: [facility] -- T-Comprehensive  Metabolic Panel [[id number]] (signed)     07/13/2010: [facility] -- T-Lipid Profile [phone] (signed)     07/13/2010: [facility] -- T-Ferritin [phone] (signed)     07/13/2010: [facility] -- T-CBC w/Diff [[id number]] (signed)     07/13/2010: [facility] -- T-TSH [phone] (signed)

## 2010-09-25 NOTE — Miscellaneous (Signed)
  Clinical Lists Changes Attempted to return patient's call 05/22/10, no answer @ either phone number. Rochele Pages, RN.

## 2010-09-25 NOTE — Assessment & Plan Note (Signed)
Summary: ACUTE-REFILLS PER GLAYDS/CFB   Vital Signs:  Patient profile:   45 year old female Height:      66.5 inches (168.91 cm) Weight:      130.0 pounds (59.09 kg) BMI:     20.74 Temp:     97.0 degrees F (36.11 degrees C) oral Pulse rate:   73 / minute BP sitting:   98 / 67  (right arm) Cuff size:   regular  Vitals Entered By: Theotis Barrio NT II (March 09, 2010 3:27 PM) CC: PATIENT LAST SEEN SEPT.  2010 / BILATERAL ARM PAIN-PATIENT SEES DR FILEDS./ HAS SPOT ON TOP OF RIGHT FOOT Is Patient Diabetic? No Pain Assessment Patient in pain? yes     Location: ARMS Intensity:   10+ Type: sharp Onset of pain  SINCE WRECK  Have you ever been in a relationship where you felt threatened, hurt or afraid?No   Does patient need assistance? Functional Status Self care Ambulation Normal Comments PATIENT LAST SEEN SEPT 2010. / BILATERAL ARM PAIN - SEES DR HYQMVH   Primary Care Provider:  Lars Mage MD  CC:  PATIENT LAST SEEN SEPT.  2010 / BILATERAL ARM PAIN-PATIENT SEES DR FILEDS./ HAS SPOT ON TOP OF RIGHT FOOT.  History of Present Illness: Patient is 22 year ols women with PMH as described in EMR most notable for cervical pain and tigling in her left hand and arm.  She has had nerve conduction studies, xrays and mutiple evaluations in the past by Dr Darrick Penna but continues to have the symptoms are very disabling.  She asks me to refer her to a neurologist which is something which has also been suggested by Dr Pearletha Forge in his last note.  Discussed with Dr Aundria Rud and we will order an MRI of her neck before we will refer her to Neurology for further evaluation.  No other complaints at this time.  Still smoking 1ppd and wants to quit. Her sister quit smoking a while ago while she was on Wellbutrin and Ms Voisin also wants me to prescribe her the same.  Preventive Screening-Counseling & Management  Alcohol-Tobacco     Smoking Status: current     Smoking Cessation Counseling: yes  Packs/Day: 1 ppd  Caffeine-Diet-Exercise     Does Patient Exercise: yes     Type of exercise: TREDMILL     Exercise (avg: min/session): 1 HOUR     Times/week:   7  Problems Prior to Update: 1)  Wrist Pain, Left  (ICD-719.43) 2)  Depressive Disorder Not Elsewhere Classified  (ICD-311) 3)  Shoulder Pain, Right  (ICD-719.41) 4)  Injury To Brachial Plexus  (ICD-953.4) 5)  Arm Pain, Right  (ICD-729.5) 6)  Back Pain, Thoracic Region  (ICD-724.1) 7)  Neck Sprain and Strain  (ICD-847.0) 8)  Preventive Health Care  (ICD-V70.0) 9)  Hypertension, Benign Essential  (ICD-401.1) 10)  Colonic Polyps  (ICD-211.3) 11)  Tobacco User  (ICD-305.1) 12)  Alcohol Use  (ICD-305.00)  Medications Prior to Update: 1)  Tramadol Hcl 50 Mg Tabs (Tramadol Hcl) .... Take 1 Tablet Every 6 Hour As Needed. 2)  Gabapentin 800 Mg Tabs (Gabapentin) .Marland Kitchen.. 1 Tab By Mouth Three Times A Day 3)  Amitriptyline Hcl 50 Mg Tabs (Amitriptyline Hcl) .... 2 By Mouth At Bedtime 4)  Tramadol Hcl 50 Mg Tabs (Tramadol Hcl) .... One To Two Po Qid  Current Medications (verified): 1)  Gabapentin 800 Mg Tabs (Gabapentin) .Marland Kitchen.. 1 Tab By Mouth Three Times A Day 2)  Amitriptyline Hcl  50 Mg Tabs (Amitriptyline Hcl) .... 2 By Mouth At Bedtime 3)  Tramadol Hcl 50 Mg Tabs (Tramadol Hcl) .... One To Two Po Qid 4)  Doxycycline Hyclate 100 Mg Caps (Doxycycline Hyclate) .... Take 1 Tablet By Mouth Two Times A Day 5)  Bupropion Hcl 100 Mg Tabs (Bupropion Hcl) .... Start With 1 Tab Two Times A Day and After 3 Days Take 1 Tab Three Times A Day  Allergies (verified): No Known Drug Allergies  Past History:  Past Medical History: Last updated: 07/02/2006 Elbow pain,right Urinary tract infection (K. Pneumonia 04'07) Alcohol use  Past Surgical History: Last updated: 07/02/2006 Tubal ligation  Family History: Last updated: 07/02/2006 Family History Ovarian cancer (Sister) Family History Hypertension  Risk Factors: Exercise: yes  (03/09/2010)  Risk Factors: Smoking Status: current (03/09/2010) Packs/Day: 1 ppd (03/09/2010)  Review of Systems      See HPI  Physical Exam  Additional Exam:  Gen: AOx3, in some distress due to pain,  Eyes: PERRL, EOMI ENT:MMM, No erythema noted in posterior pharynx Neck: No JVD, No LAP Chest: CTAB with  good respiratory effort CVS: regular rhythmic rate, NO M/R/G, S1 S2 normal Abdo: soft,ND, BS+x4, Non tender and No hepatosplenomegaly EXT: No odema noted Neuro: Non focal, gait is normal Skin: no rashes noted.    Impression & Recommendations:  Problem # 1:  BACK PAIN, THORACIC REGION (ICD-724.1) Assessment Unchanged I discussed the case with Dr Aundria Rud and since we do not have an evaluation of her spine with an MRI, we agreed that before sending her to a specialist we should get an MRI done for her radiculopathy.   She does not have any danger signs like gait problems, incontinence, sensory loss or weakness. She also does not complain of fever, chills, nausea or vomiting which should rule out any impending cord compression or abscess. Will follow up MRI and refer accordingly. We will also refer her to PT.  The following medications were removed from the medication list:    Tramadol Hcl 50 Mg Tabs (Tramadol hcl) .Marland Kitchen... Take 1 tablet every 6 hour as needed. Her updated medication list for this problem includes:    Tramadol Hcl 50 Mg Tabs (Tramadol hcl) ..... One to two po qid  Discussed use of moist heat or ice, modified activities, medications, and stretching/strengthening exercises. Back care instructions given. To be seen in 2 weeks if no improvement; sooner if worsening of symptoms.   Problem # 2:  HYPERTENSION, BENIGN ESSENTIAL (ICD-401.1) Assessment: Comment Only No meds at this time required. BP today: 98/67 Prior BP: 103/73 (02/20/2010)  Labs Reviewed: K+: 4.4 (05/02/2009) Creat: : 0.79 (05/02/2009)     Problem # 3:  TOBACCO USER (ICD-305.1) Assessment:  Comment Only Patient was counseled on smoking cessation strategies including medications and behavior modification options. Patient agreed to try wellbutrin. Prescribed for 2 weeks and plan to see her back in 2 weeks for follow uip. Encouraged smoking cessation and discussed different methods for smoking cessation.   Problem # 4:  Preventive Health Care (ICD-V70.0) Assessment: Comment Only Mammogram and Gyn referral given today.  Complete Medication List: 1)  Gabapentin 800 Mg Tabs (Gabapentin) .Marland Kitchen.. 1 tab by mouth three times a day 2)  Amitriptyline Hcl 50 Mg Tabs (Amitriptyline hcl) .... 2 by mouth at bedtime 3)  Tramadol Hcl 50 Mg Tabs (Tramadol hcl) .... One to two po qid 4)  Doxycycline Hyclate 100 Mg Caps (Doxycycline hyclate) .... Take 1 tablet by mouth two times a day 5)  Bupropion Hcl 100 Mg Tabs (Bupropion hcl) .... Start with 1 tab two times a day and after 3 days take 1 tab three times a day  Other Orders: MRI without Contrast (MRI w/o Contrast) Physical Therapy Referral (PT) Gynecologic Referral (Gyn) Mammogram (Screening) (Mammo)  Patient Instructions: 1)  Please schedule a follow-up appointment in 2 weeks. 2)  Tobacco is very bad for your health and your loved ones! You Should stop smoking!. 3)  Stop Smoking Tips: Choose a Quit date. Cut down before the Quit date. decide what you will do as a substitute when you feel the urge to smoke(gum,toothpick,exercise). 4)  Check your Blood Pressure regularly. If it is above:160/100 you should make an appointment. Prescriptions: BUPROPION HCL 100 MG TABS (BUPROPION HCL) start with 1 tab two times a day and after 3 days take 1 tab three times a day  #90 x 0   Entered and Authorized by:   Lars Mage MD   Signed by:   Lars Mage MD on 03/09/2010   Method used:   Print then Give to Patient   RxID:   [id number] DOXYCYCLINE HYCLATE 100 MG CAPS (DOXYCYCLINE HYCLATE) Take 1 tablet by mouth two times a day  #14 x 0   Entered and  Authorized by:   Lars Mage MD   Signed by:   Lars Mage MD on 03/09/2010   Method used:   Print then Give to Patient   RxID:   [id number]    Prevention & Chronic Care Immunizations   Influenza vaccine: Fluvax Non-MCR  (05/18/2009)   Influenza vaccine deferral: Deferred  (03/09/2010)    Tetanus booster: Not documented   Td booster deferral: Deferred  (03/09/2010)    Pneumococcal vaccine: Not documented  Other Screening   Pap smear: Normal  (06/06/2006)   Pap smear action/deferral: GYN Referral  (03/09/2010)    Mammogram: Normal  (05/30/2006)   Mammogram action/deferral: Ordered  (03/09/2010)   Smoking status: current  (03/09/2010)   Smoking cessation counseling: yes  (03/09/2010)  Lipids   Total Cholesterol: Not documented   LDL: Not documented   LDL Direct: Not documented   HDL: Not documented   Triglycerides: Not documented  Hypertension   Last Blood Pressure: 98 / 67  (03/09/2010)   Serum creatinine: 0.79  (05/02/2009)   Serum potassium 4.4  (05/02/2009)  Self-Management Support :   Personal Goals (by the next clinic visit) :      Personal blood pressure goal: 130/80  (03/09/2010)   Patient will work on the following items until the next clinic visit to reach self-care goals:     Medications and monitoring: take my medicines every day, bring all of my medications to every visit  (03/09/2010)     Eating: drink diet soda or water instead of juice or soda, eat more vegetables, use fresh or frozen vegetables, eat foods that are low in salt, eat baked foods instead of fried foods, eat fruit for snacks and desserts, limit or avoid alcohol  (03/09/2010)     Activity: take a 30 minute walk every day  (03/09/2010)    Hypertension self-management support: Resources for patients handout  (03/09/2010)    Self-management comments: PATIENT HAS A TREAD MILL IN [employer] printed.   Nursing Instructions: Schedule screening mammogram (see order) Gyn  referral for screening Pap (see order)

## 2010-09-25 NOTE — Progress Notes (Signed)
Summary: Refill/gh  Phone Note Refill Request Message from:  Fax from Pharmacy on April 27, 2010 2:33 PM  Refills Requested: Medication #1:  BUPROPION HCL 100 MG TABS start with 1 tab two times a day and after 3 days take 1 tab three times a day   Last Refilled: 03/12/2010  Method Requested: Electronic Initial call taken by: Angelina Ok RN,  April 27, 2010 2:33 PM  Follow-up for Phone Call       Follow-up by: Blanch Media MD,  April 27, 2010 5:05 PM    New/Updated Medications: BUPROPION HCL 100 MG TABS (BUPROPION HCL) Take 1 tab three times a day Prescriptions: BUPROPION HCL 100 MG TABS (BUPROPION HCL) Take 1 tab three times a day  #90 x 1   Entered and Authorized by:   Blanch Media MD   Signed by:   Blanch Media MD on 04/27/2010   Method used:   Electronically to        [facility] (retail)       [address]       [place], [place]  [address]       Ph: [phone]       Fax: [phone]   RxID:   [id number]
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
## 2010-09-25 NOTE — Letter (Signed)
Summary: [facility] Refferal form  [facility] Refferal form   Imported By: Marily Memos 12/20/2009 15:33:05  _____________________________________________________________________  External Attachment:    Type:   Image     Comment:   External Document
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
## 2010-09-25 NOTE — Letter (Signed)
Summary: [facility] Missed Evaluation Notification  [facility] Missed Evaluation Notification   Imported By: Marily Memos 09/05/2009 11:00:42  _____________________________________________________________________  External Attachment:    Type:   Image     Comment:   External Document
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
## 2010-09-25 NOTE — Assessment & Plan Note (Signed)
Summary: Walk-in  Pt came to clinic thinking she had appointment today.  She has SM appointment this Thursday and appointment  with [place] on 10/17.  Pt is tearful and asked for refill on meds.  I checked and valium is  the med that needs refills.  Pt does not want valium as it does not help.    She is unhappy with Dr Darrick Penna as he did not order an MRI one year ago and she is wondering if the Doctor at [facility] will help her. I tried to reassure pt and told her the Doctors at [facility] will be able to address her neuro problems. I asked pt to keep appointments with Dr Darrick Penna and Dr Eben Burow.  Thank you for seeing the patient. Lars Mage MD  May 28, 2010 4:25 PM

## 2010-09-25 NOTE — Progress Notes (Signed)
Summary: Return call  Phone Note Call from Patient   Caller: Patient Call For: Lars Mage MD Summary of Call: Call from pt returning a call from Dr. Eben Burow.  Pt can be reached at [phone].Angelina Ok RN  July 31, 2010 9:11 AM  Initial call taken by: Angelina Ok RN,  July 31, 2010 9:11 AM  Follow-up for Phone Call        Talked to the patient. Explained her about the NSG plan to reschedule her. She will come and talk to me on Monday about everything. Please schedule an appointment with me on Monday afternooc. Overbook me if I am already full. Thanks. Follow-up by: Lars Mage MD,  August 01, 2010 11:54 AM
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
## 2010-09-27 NOTE — Consult Note (Signed)
Summary: Julie Williams/  Julie Williams/   Imported By: Margie Billet 08/15/2010 11:13:04  _____________________________________________________________________  External Attachment:    Type:   Image     Comment:   External Document
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
## 2010-09-27 NOTE — Consult Note (Signed)
[facility]   Imported By: Margie Billet 08/13/2010 14:43:32  _____________________________________________________________________  External Attachment:    Type:   Image     Comment:   External Document

## 2010-09-27 NOTE — Consult Note (Signed)
[facility]   Imported By: Margie Billet 08/31/2010 11:18:55  _____________________________________________________________________  External Attachment:    Type:   Image     Comment:   External Document
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
## 2010-09-27 NOTE — Miscellaneous (Signed)
Summary: HEALTH INFORMATION  HEALTH INFORMATION   Imported By: Margie Billet 08/14/2010 12:28:49  _____________________________________________________________________  External Attachment:    Type:   Image     Comment:   External Document

## 2010-09-27 NOTE — Progress Notes (Signed)
Summary: phone/gg   *  Phone Note Call from Patient   Caller: Patient Summary of Call: Pt is asking if you have received the phych. evaluation from 12/15 and if she needs to keep appointment with you on January 2.  If you have the report why does she need to come back in? Has her surgery been cancelled at [facility]?  She did not recieve notification from them about the cancelation and thinks it's scheduled for Jan. 20.  Please clarify. Initial call taken by: Merrie Roof RN,  August 21, 2010 11:24 AM  Follow-up for Phone Call        tried to call her up but the phone was on voicemail. The surgery has been cancelled at [facility] and we are trying to reschedule her surgery by sending them the psych evaluation back. I would encourage to keep up the appointment so that we can further work on all these issues.  thanks Follow-up by: Lars Mage MD,  August 22, 2010 11:23 AM  Additional Follow-up for Phone Call Additional follow up Details #1::        Tried to call pt, no answer Merrie Roof RN  August 22, 2010 12:43 PM     Additional Follow-up for Phone Call Additional follow up Details #2::    Received call from Crystal @ [facility]. nurse [phone]) from Dr Berna Bue, Neurosurgeon Pt is scheduled for surgery on January 19th and office visit on on January 12.  Dr Berna Bue  asked Dr Eben Burow to refer pt to ENT doctor.  Has this been done?  ( Nov 1 letter request this ) Follow-up by: Merrie Roof RN,  August 22, 2010 4:53 PM  Additional Follow-up for Phone Call Additional follow up Details #3:: Details for Additional Follow-up Action Taken: Patient hasnt had an ENT evaluation as yet but we will put in request on my next appointment.  thanks Additional Follow-up by: Lars Mage MD,  August 24, 2010 1:34 PM  Contacted [facility] ENT @ [phone] (per pt's request) appt scheduled for Feb 1st 10am for hearing test and   Appended Document: ENT REFERRAL  F/U//KG    Phone Note Outgoing Call   Call placed by: Cynda Familia Duncan Dull),  August 28, 2010 4:49 PM Summary of Call: [facility] ENT @ [phone] (per pt's request) appt scheduled for Feb 1st 10am for hearing test and to see the MD (DR Lyn Hollingshead) at 10am.  I contacted Cystal at [facility] Neurosurgical dept to inform her of pt's appt and she wanted to try to see if their department could get an earlier appt.  Crystal will contact me back tomorrow.Cynda Familia [facility])  August 29, 2010 4:55 PM   Follow-up for Phone Call        Call back from [facility] appt has been r/s'd to Jan 9th at 8:30am for hearing test and 10am to see the MD (Dr Lyn Hollingshead).  Appt info left on pt's recorder.Cynda Familia [facility])  August 30, 2010 2:51 PM  Follow-up by: Cynda Familia Duncan Dull),  August 30, 2010 2:51 PM  Additional Follow-up for Phone Call Additional follow up Details #1::        F/u call made to [facility] ENT to see if pt kept appt and it was confirmed that pt did make appt and they will send the note to our office once completed. Additional Follow-up by: Cynda Familia [facility]),  September 04, 2010 2:42 PM
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
## 2010-09-28 NOTE — Letter (Signed)
Summary: [facility] Orthopedic Referral  [facility] Orthopedic Referral   Imported By: Marily Memos 07/16/2010 09:44:49  _____________________________________________________________________  External Attachment:    Type:   Image     Comment:   External Document

## 2010-09-28 NOTE — Miscellaneous (Signed)
Summary: HEALTH INFORMATION  HEALTH INFORMATION   Imported By: Margie Billet 07/23/2010 09:14:47  _____________________________________________________________________  External Attachment:    Type:   Image     Comment:   External Document

## 2010-10-01 ENCOUNTER — Other Ambulatory Visit: Payer: Self-pay | Admitting: *Deleted

## 2010-10-01 MED ORDER — TRAMADOL HCL 50 MG PO TABS: 50.0000 mg | ORAL_TABLET | Freq: Four times a day (QID) | ORAL | Status: AC | PRN

## 2010-10-03 NOTE — Consult Note (Signed)
[facility]   Imported By: Margie Billet 09/14/2010 10:35:43  _____________________________________________________________________  External Attachment:    Type:   Image     Comment:   External Document

## 2010-10-03 NOTE — Consult Note (Signed)
[facility]   Imported By: Margie Billet 09/12/2010 11:21:38  _____________________________________________________________________  External Attachment:    Type:   Image     Comment:   External Document

## 2010-11-11 ENCOUNTER — Encounter: Payer: Self-pay | Admitting: Internal Medicine

## 2010-12-10 LAB — CBC
HCT: 44.9 % (ref 36.0–46.0)
MCV: 98.1 fL (ref 78.0–100.0)
RBC: 4.57 MIL/uL (ref 3.87–5.11)
WBC: 6.6 10*3/uL (ref 4.0–10.5)

## 2010-12-10 LAB — BASIC METABOLIC PANEL
CO2: 24 mEq/L (ref 19–32)
Calcium: 9.6 mg/dL (ref 8.4–10.5)
Chloride: 107 mEq/L (ref 96–112)
Potassium: 4 mEq/L (ref 3.5–5.1)

## 2010-12-10 LAB — POCT I-STAT, CHEM 8
Chloride: 109 mEq/L (ref 96–112)
Creatinine, Ser: 0.8 mg/dL (ref 0.4–1.2)
Hemoglobin: 15.6 g/dL — ABNORMAL HIGH (ref 12.0–15.0)
TCO2: 23 mmol/L (ref 0–100)

## 2010-12-31 ENCOUNTER — Ambulatory Visit: Payer: Self-pay | Admitting: Rehabilitative and Restorative Service Providers"

## 2011-01-11 NOTE — Group Therapy Note (Signed)
NAME:  Julie Williams, Julie Williams               ACCOUNT NO.:  [id number]   MEDICAL RECORD NO.:  [id number]          PATIENT TYPE:  WOC   LOCATION:  [facility]                   FACILITY:  [facility]   PHYSICIAN:  Kathlyn Sacramento, M.D.   DATE OF BIRTH:  08-17-1966   DATE OF SERVICE:  01/02/2006                                    CLINIC NOTE   CHIEF COMPLAINT:  Here for yearly exam.   HISTORY OF PRESENT ILLNESS:  The patient is a 45 year old white female, who  recently had a negative HPV done in October 2006 as well as a normal Pap  smear done in June 2006.  She has a remote history of abnormal Pap smears  with cryo in the past.  She had a mammogram done today.  She does state that  she is with a new partner and has noticed some discomfort with intercourse  secondary to penile size.  This is the only partner she has had since  October.  The patient recently quit smoking.   PHYSICAL EXAMINATION:  VITAL SIGNS:  Temp 97.8, pulse 51, blood pressure  124/79, weight 125.7.  GENERAL:  Well-developed, well-nourished female in no acute distress.  BREAST EXAM:  Normal skin.  No supraclavicular or axillary lymphadenopathy.  No masses found on breast exam.  No discharge expressed.  GU EXAM:  Deferred.   IMPRESSION:  Well female.   PLAN:  1.  Clinical breast exam and Pap smear done today.  2.  As the patient had a negative HPV done in October, she is not due for a      Pap smear until October.  Plan discussed with Dr. Shawnie Pons.           ______________________________  Kathlyn Sacramento, M.D.     AC/MEDQ  D:  01/02/2006  T:  01/03/2006  Job:  [id number]

## 2011-01-11 NOTE — Group Therapy Note (Signed)
NAME:  Julie Williams, PANOS NO.:  [id number]   MEDICAL RECORD NO.:  [id number]          PATIENT TYPE:  WOC   LOCATION:  [facility]                   FACILITY:  [facility]   PHYSICIAN:  Argentina Donovan, MD        DATE OF BIRTH:  [birth year]   DATE OF SERVICE:                                    CLINIC NOTE   HISTORY:  The patient is a 45 year old white female, gravida 1, para 1,  ___________ 1, who came in following a normal Pap smear because she had a  recent relationship with someone with condylomata acuminata.  She wanted to  be checked for HPV, so an HPV test was run today.  Mostly we had a long  consultation with the patient.  Her other chief complaint is painful rectal  bleeding for the last seven or eight months.   FAMILY HISTORY:  She has a family history on both sides of the family of  rectal colon polyps and cancer.   DISPOSITION:  We are going to refer her to a gastroenterologist.  We have  discussed in detail HPV, the process of immunization and the risks involved.   IMPRESSION:  1.  Human papilloma virus contact.  2.  Long-term rectal bleeding.           ______________________________  Argentina Donovan, MD     PR/MEDQ  D:  05/30/2005  T:  05/31/2005  Job:  [id number]

## 2011-01-11 NOTE — Group Therapy Note (Signed)
NAME:  Julie Williams, Julie Williams               ACCOUNT NO.:  [id number]   MEDICAL RECORD NO.:  [id number]          PATIENT TYPE:  WOC   LOCATION:  [facility]                   FACILITY[facility]  [facility]   PHYSICIAN:  Tinnie Gens, MD        DATE OF BIRTH:  23-Sep-1965   DATE OF SERVICE:  01/24/2005                                    CLINIC NOTE   CHIEF COMPLAINT:  Having trouble with urination.   HISTORY OF PRESENT ILLNESS:  The patient is a 45 year old gravida 1 para 1  who is self-referred in and it has been a long time since she had her last  Pap. She does have a history of abnormal Paps and received cryo previously.  She was last seen in our office in 2003 but she states that she is having  irregular cycle, hot flashes, mood swings, and weight gain. She has gained  26 pounds in the last 2 years. She is also having trouble with urination.  She states that she has a lack of urge to pee. She has no dysuria and  sometimes notices an odor but she had terrible abdominal pain and she has to  actually sit and finally once she urinates her abdominal pain subsides.   PAST MEDICAL HISTORY:  Negative.   PAST SURGICAL HISTORY:  C-section, BTL, arthroscopic knee.   MEDICATIONS:  None.   ALLERGIES:  None known.   OBSTETRICAL HISTORY:  She is a G1 P1, one C-section.   GYNECOLOGICAL HISTORY:  History of abnormal Paps with cryo.   FAMILY HISTORY:  Hypertension, colon cancer, ovarian cancer.   SOCIAL HISTORY:  Positive tobacco, one pack per day. Positive alcohol  socially. No drug use. She does work. She is self-employed, [employer]  and [employer].   REVIEW OF SYSTEMS:  A 14-point review of systems is reviewed. It is positive  for constipation with a change in the caliber of her stools, occasionally  blood noted in her stool, as well as her irregular cycles and this urinary  problem as described in the HPI. The patient also has significant  depression.   PHYSICAL EXAMINATION TODAY:  GENERAL:  She is a  well-developed, well-  nourished white female in no acute distress. Her vital signs are as noted in  the chart. She is tearful, sobbing, thinks that she is ready for death but  has no suicidal plan.  NECK:  Supple with normal thyroid.  BREASTS:  Symmetric with everted nipples. There is no supraclavicular or  axillary adenopathy.  ABDOMEN:  Soft, nontender, nondistended.  GENITOURINARY:  Normal external female genitalia. The vagina is pink and  rugated. The cervix is nulliparous and without lesions. The uterus is small,  anteverted, no adnexal mass or tenderness.  EXTREMITIES:  No cyanosis, clubbing, or edema.   IMPRESSION:  1.  Gynecologic examination with Pap smear.  2.  Irregular cycles.  3.  Depression.  4.  Urinary dysfunction.  5.  Change in the caliber of stool.   PLAN:  1.  Pap smear today.  2.  Check TSH and FSH.  3.  UA.  4.  Have discussed referral to GI. However, the patient is very scared about      how much money it will cost her to go there. She is instructed to call      them. I have given her there number so that she can find out how much      payment will be. I do think the patient would be a good candidate for      colonoscopy at this point.  5.  Also will given her mental health number referral so that she can at      least talk to someone and possibly get on medication for depression,      which sounds like it has been a longterm problem and she clearly needs      help in that department.  6.  The patient will follow up in 4 weeks for test results and revisiting      and discussion of these issues.      TP/MEDQ  D:  01/24/2005  T:  01/24/2005  Job:  [id number]

## 2011-06-04 DIAGNOSIS — F1920 Other psychoactive substance dependence, uncomplicated: Secondary | ICD-10-CM

## 2011-06-04 DIAGNOSIS — M542 Cervicalgia: Secondary | ICD-10-CM | POA: Insufficient documentation

## 2011-06-04 DIAGNOSIS — Z79891 Long term (current) use of opiate analgesic: Secondary | ICD-10-CM

## 2011-06-04 DIAGNOSIS — G8929 Other chronic pain: Secondary | ICD-10-CM | POA: Insufficient documentation

## 2011-06-04 DIAGNOSIS — M25511 Pain in right shoulder: Secondary | ICD-10-CM

## 2011-06-04 HISTORY — DX: Other psychoactive substance dependence, uncomplicated: F19.20

## 2011-06-04 HISTORY — DX: Long term (current) use of opiate analgesic: Z79.891

## 2011-06-04 HISTORY — DX: Other chronic pain: G89.29

## 2011-06-04 HISTORY — DX: Pain in right shoulder: M25.511

## 2011-07-10 DIAGNOSIS — F17200 Nicotine dependence, unspecified, uncomplicated: Secondary | ICD-10-CM

## 2011-07-10 HISTORY — DX: Nicotine dependence, unspecified, uncomplicated: F17.200

## 2011-07-29 ENCOUNTER — Other Ambulatory Visit: Payer: Self-pay | Admitting: Obstetrics and Gynecology

## 2011-07-29 DIAGNOSIS — Z1231 Encounter for screening mammogram for malignant neoplasm of breast: Secondary | ICD-10-CM

## 2011-07-30 ENCOUNTER — Ambulatory Visit (INDEPENDENT_AMBULATORY_CARE_PROVIDER_SITE_OTHER): Payer: Self-pay | Admitting: *Deleted

## 2011-07-30 ENCOUNTER — Ambulatory Visit (INDEPENDENT_AMBULATORY_CARE_PROVIDER_SITE_OTHER): Payer: Self-pay

## 2011-07-30 ENCOUNTER — Ambulatory Visit (HOSPITAL_COMMUNITY)
Admission: RE | Admit: 2011-07-30 | Discharge: 2011-07-30 | Disposition: A | Discharge status: Home / Self Care | Payer: Self-pay | Source: Ambulatory Visit | Attending: Obstetrics and Gynecology | Admitting: Obstetrics and Gynecology

## 2011-07-30 VITALS — BP 115/80 | HR 81 | Temp 98.6°F | Resp 16 | Ht 65.0 in | Wt 121.9 lb

## 2011-07-30 DIAGNOSIS — Z01419 Encounter for gynecological examination (general) (routine) without abnormal findings: Secondary | ICD-10-CM

## 2011-07-30 DIAGNOSIS — Z1231 Encounter for screening mammogram for malignant neoplasm of breast: Secondary | ICD-10-CM

## 2011-07-30 DIAGNOSIS — Z23 Encounter for immunization: Secondary | ICD-10-CM

## 2011-07-30 NOTE — Progress Notes (Signed)
No complaints today.  Pap Smear:    Pap smear completed today. Patients last Pap smear was 09/26/08 and was normal. Patient had a Pap in 06/06/06 and 01/24/05 and were both normal. Patient had an abnormal Pap smear 04/01/02 that was ASCUS and per patient a repeat Pap smear was completed and normal. If today's Pap smear is normal patient will need her next Pap smear 07/29/14. Pap smear results above are in EPIC.  Physical exam: Breasts Breasts symmetrical. No skin abnormalities bilateral breasts. No nipple retraction bilateral breasts. No nipple discharge bilateral breasts. No lymphadenopathy. No lumps palpated bilateral breasts.          Pelvic/Bimanual   Ext Genitalia No lesions, no swelling and no discharge observed on external genitalia.         Vagina Vagina pink and normal texture. No lesions or discharge observed in vagina.          Cervix Cervix is present. Cervix pink and of normal texture. No discharge observed.     Uterus Uterus is present and palpable. Uterus in normal position and normal size.        Adnexae Bilateral ovaries present and palpable. No tenderness on palpation.          Rectovaginal No rectal exam completed today since patient had no rectal complaints. No skin abnormalities observed on exam.

## 2011-07-30 NOTE — Patient Instructions (Signed)
Taught patient how to perform BSE and gave educational materials to take home. Informed patient that she will not need another Pap smear for 3 years if this Pap smear is normal since patient's last 3 Pap smears have been normal.  Let her know [facility] will cover Pap smears every 3 years unless has a history of abnormal Pap smears. Patient escorted to mammography for a mammogram. Let patient know will follow up with her within the next couple weeks with results by either letter or phone. Patient verbalized understanding.

## 2011-08-02 ENCOUNTER — Encounter: Payer: Self-pay | Admitting: Obstetrics and Gynecology

## 2011-08-26 DIAGNOSIS — M67919 Unspecified disorder of synovium and tendon, unspecified shoulder: Secondary | ICD-10-CM | POA: Insufficient documentation

## 2011-09-13 DIAGNOSIS — Z860100 Personal history of colon polyps, unspecified: Secondary | ICD-10-CM

## 2011-09-13 DIAGNOSIS — I10 Essential (primary) hypertension: Secondary | ICD-10-CM

## 2011-09-13 HISTORY — DX: Essential (primary) hypertension: I10

## 2011-09-13 HISTORY — DX: Personal history of colon polyps, unspecified: Z86.0100

## 2011-09-13 HISTORY — DX: Personal history of colonic polyps: Z86.010

## 2011-10-03 ENCOUNTER — Telehealth: Payer: Self-pay | Admitting: *Deleted

## 2011-10-03 ENCOUNTER — Other Ambulatory Visit: Payer: Self-pay | Admitting: Internal Medicine

## 2011-10-03 MED ORDER — BUPROPION HCL ER (SR) 150 MG PO TB12: 150.0000 mg | ORAL_TABLET | Freq: Two times a day (BID) | ORAL | Status: DC

## 2011-10-03 NOTE — Telephone Encounter (Signed)
Pt calls and states she would like zyban to help her quit smoking, she has not been seen in well over a year

## 2011-10-07 ENCOUNTER — Encounter: Payer: Self-pay | Admitting: Family

## 2011-10-07 ENCOUNTER — Ambulatory Visit (INDEPENDENT_AMBULATORY_CARE_PROVIDER_SITE_OTHER): Payer: Self-pay | Admitting: Family

## 2011-10-07 VITALS — BP 116/77 | HR 83 | Temp 98.4°F | Ht 65.0 in | Wt 128.4 lb

## 2011-10-07 DIAGNOSIS — N760 Acute vaginitis: Secondary | ICD-10-CM

## 2011-10-07 LAB — WET PREP, GENITAL: Yeast Wet Prep HPF POC: NONE SEEN

## 2011-10-07 MED ORDER — FLUCONAZOLE 150 MG PO TABS: 150.0000 mg | ORAL_TABLET | Freq: Once | ORAL | Status: AC

## 2011-10-07 NOTE — Patient Instructions (Signed)
Atrophic Vaginitis Atrophic vaginitis is a problem of low levels of estrogen in women. This problem can happen at any age. It is most common in women who have gone through menopause ("the change").  HOW WILL I KNOW IF I HAVE THIS PROBLEM? You may have:  Trouble with peeing (urinating), such as:   Going to the bathroom often.   A hard time holding your pee until you reach a bathroom.   Leaking pee.   Having pain when you pee.   Itching or a burning feeling.   Vaginal bleeding and spotting.   Pain during sex.   Dryness of the vagina.   A yellow, bad-smelling fluid (discharge) coming from the vagina.  HOW WILL MY DOCTOR CHECK FOR THIS PROBLEM?  During your exam, your doctor will likely find the problem.   If there is a vaginal fluid, it may be checked for infection.  HOW WILL THIS PROBLEM BE TREATED? Keep the vulvar skin as clean as possible. Moisturizers and lubricants can help with some of the symptoms. Estrogen replacement can help. There are 2 ways to take estrogen:  Systemic estrogen gets estrogen to your whole body. It takes many weeks or months before the symptoms get better.   You take an estrogen pill.   You use a skin patch. This is a patch that you put on your skin.   If you still have your uterus, your doctor may ask you to take a hormone. Talk to your doctor about the right medicine for you.   Estrogen cream.  This puts estrogen only at the part of your body where you apply it. The cream is put into the vagina or put on the vulvar skin. For some women, estrogen cream works faster than pills or the patch.  CAN ALL WOMEN WITH THIS PROBLEM USE ESTROGEN? No. Women with certain types of cancer, liver problems, or problems with blood clots should not take estrogen. Your doctor can help you decide the best treatment for your symptoms. Document Released: 01/29/2008 Document Revised: 04/25/2011 Document Reviewed: 01/29/2008 [employer] Patient Information 2012 [employer]. 

## 2011-10-07 NOTE — Progress Notes (Signed)
  Subjective:    Julie Williams is a 46 y.o. female who presents for clear, copious vaginal discharge since pap smear on 07/30/11. Sexual history reviewed with the patient. STI Exposure: denies knowledge of risky exposure. Previous history of STI n/a. Current symptoms vaginal irritation: copious, itching. Contraception: tubal ligation Menstrual History: OB History    Grav Para Term Preterm Abortions TAB SAB Ect Mult Living   1         1       No LMP recorded. Patient is postmenopausal.    The following portions of the patient's history were reviewed and updated as appropriate: allergies, current medications, past family history, past medical history, past social history, past surgical history and problem list.  Review of Systems Pertinent items are noted in HPI.    Objective:    BP 116/77  Pulse 83  Temp(Src) 98.4 F (36.9 C) (Oral)  Ht 5\' 5"  (1.651 m)  Wt 128 lb 6.4 oz (58.242 kg)  BMI 21.37 kg/m2 General:   alert, cooperative and appears older than stated age  Lymph Nodes:   Cervical, supraclavicular, and axillary nodes normal.  Pelvis:  External genitalia: slightly red Vaginal: discharge, clear Cervix: normal appearance  Cultures:  wet prep     Assessment:    Very low risk of STD exposure   Vaginitis Plan:    Will call pt with results RX for Diflucan 150mg  PO  F/U prn [facility]

## 2011-10-11 ENCOUNTER — Telehealth: Payer: Self-pay | Admitting: *Deleted

## 2011-10-11 ENCOUNTER — Other Ambulatory Visit: Payer: Self-pay | Admitting: Family

## 2011-10-11 MED ORDER — FLUCONAZOLE 150 MG PO TABS: 150.0000 mg | ORAL_TABLET | Freq: Once | ORAL | Status: AC

## 2011-10-11 NOTE — Telephone Encounter (Signed)
Pt left message that she still has the d/c after taking the prescribed medication. Rx refill given by Sid Falcon CNM. I called and left message for pt that she may pick up refill later today.

## 2011-10-28 ENCOUNTER — Telehealth: Payer: Self-pay | Admitting: *Deleted

## 2011-10-28 DIAGNOSIS — N898 Other specified noninflammatory disorders of vagina: Secondary | ICD-10-CM

## 2011-10-28 MED ORDER — METRONIDAZOLE 500 MG PO TABS: 500.0000 mg | ORAL_TABLET | Freq: Two times a day (BID) | ORAL | Status: AC

## 2011-10-28 NOTE — Telephone Encounter (Signed)
Patient left a message stating that she is still having vaginal discharge with odor. She has used diflucan twice and is getting no relief. Please call me back and let me know what else I can do.

## 2011-10-28 NOTE — Telephone Encounter (Signed)
Called patient back she states that she is having a thin discharge with odor. Advised that based on her symptoms it sounds like she has bacterial vaginosis and that I could call in medicine per standing orders. Pt will try this medicine and if she gets no relief she will call back and make appt.

## 2011-11-29 ENCOUNTER — Encounter: Payer: Self-pay | Admitting: Physician Assistant

## 2011-11-29 ENCOUNTER — Ambulatory Visit (INDEPENDENT_AMBULATORY_CARE_PROVIDER_SITE_OTHER): Payer: Self-pay | Admitting: Physician Assistant

## 2011-11-29 VITALS — BP 119/85 | HR 77 | Temp 97.4°F | Ht 65.0 in | Wt 129.1 lb

## 2011-11-29 DIAGNOSIS — N898 Other specified noninflammatory disorders of vagina: Secondary | ICD-10-CM

## 2011-11-29 DIAGNOSIS — N952 Postmenopausal atrophic vaginitis: Secondary | ICD-10-CM
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
## 2011-11-29 NOTE — Patient Instructions (Signed)
Patient information: Atrophic vaginitis (The Basics)View in SpanishWritten by the doctors and editors at UpToDate  What is atrophic vaginitis? -- Atrophic vaginitis is a condition that causes the vagina and tissues near the vagina to get dry, thin, and inflamed. This can be uncomfortable or make sex painful. Atrophic vaginitis happens when a woman does not make enough of a hormone called estrogen. This condition mainly affects women who have been through menopause (meaning they have stopped having a monthly period). It can also happen to women whose ovaries were removed, who are taking certain medicines, or who are nursing.  What are the symptoms of atrophic vaginitis? -- The symptoms include: Vaginal dryness  Vaginal burning or irritation  Making less lubrication during sex  Pain during sex  Bleeding from the vagina, for example after sex. (If you have this symptom, be sure to see a doctor.)  Vaginal discharge (leaking fluid from the vagina)  Urinary problems, such as having to urinate often, having pain with urination, or having blood in the urine. (If you have these symptoms, be sure to see a doctor.)  Some women never tell their doctor they are having symptoms of atrophic vaginitis. Often they are embarrassed or think the symptoms are a normal part of aging. If you have symptoms of this condition, and they bother you, mention it to your doctor or nurse. There are treatments that can help.  Is there a test for atrophic vaginitis? -- No. There is no test. But your doctor or nurse should be able to tell if you have it by learning about your symptoms and doing an exam.  Is there anything I can do on my own to feel better? -- Yes. Some women feel better if they use lubricants before sex and use a vaginal moisturizer, such as Replens or Lubrin, several times a week. Vaginal moisturizers are not the same as lubricants. They help keep the vagina moist all the time, not just during sex. Also, keep in mind  that having sex can help keep the vagina healthy. Should I see a doctor or nurse? -- See your doctor or nurse if you have symptoms of atrophic vaginitis and they bother you.  How is atrophic vaginitis treated? -- The most effective treatment for atrophic vaginitis is the hormone estrogen. When using estrogen to treat atrophic vaginitis, doctors recommend "vaginal estrogen." Vaginal estrogen any form of estrogen that goes directly into the vagina. It comes in creams, tablets, or a flexible ring. Vaginal estrogen comes in small doses that don't increase the levels of estrogen in other parts of the body very much. Estrogen also comes in a pill that you swallow or a skin patch, but vaginal estrogen is better for treating symptoms of atrophic vaginitis. Some women who take vaginal estrogen must also take another hormone, called progesterone.  If you want to take estrogen, ask your doctor or nurse what the possible risks and benefits are for you. If you have a history of breast cancer, ask whether hormones are safe for you. Atrophic Vaginitis Atrophic vaginitis is a problem of low levels of estrogen in women. This problem can happen at any age. It is most common in women who have gone through menopause ("the change").  HOW WILL I KNOW IF I HAVE THIS PROBLEM? You may have:  Trouble with peeing (urinating), such as:   Going to the bathroom often.   A hard time holding your pee until you reach a bathroom.   Leaking pee.   Having  pain when you pee.   Itching or a burning feeling.   Vaginal bleeding and spotting.   Pain during sex.   Dryness of the vagina.   A yellow, bad-smelling fluid (discharge) coming from the vagina.  HOW WILL MY DOCTOR CHECK FOR THIS PROBLEM?  During your exam, your doctor will likely find the problem.   If there is a vaginal fluid, it may be checked for infection.  HOW WILL THIS PROBLEM BE TREATED? Keep the vulvar skin as clean as possible. Moisturizers and lubricants  can help with some of the symptoms. Estrogen replacement can help. There are 2 ways to take estrogen:  Systemic estrogen gets estrogen to your whole body. It takes many weeks or months before the symptoms get better.   You take an estrogen pill.   You use a skin patch. This is a patch that you put on your skin.   If you still have your uterus, your doctor may ask you to take a hormone. Talk to your doctor about the right medicine for you.   Estrogen cream.  This puts estrogen only at the part of your body where you apply it. The cream is put into the vagina or put on the vulvar skin. For some women, estrogen cream works faster than pills or the patch.  CAN ALL WOMEN WITH THIS PROBLEM USE ESTROGEN? No. Women with certain types of cancer, liver problems, or problems with blood clots should not take estrogen. Your doctor can help you decide the best treatment for your symptoms. Document Released: 01/29/2008 Document Revised: 08/01/2011 Document Reviewed: 01/29/2008 [facility] Patient Information 2012 [place], [place].

## 2011-11-29 NOTE — Progress Notes (Signed)
Addended by: August Luz on: 11/29/2011 09:19 AM   Modules accepted: Orders

## 2011-11-30 LAB — GC/CHLAMYDIA PROBE AMP, GENITAL: Chlamydia, DNA Probe: NEGATIVE

## 2011-11-30 LAB — WET PREP, GENITAL: Clue Cells Wet Prep HPF POC: NONE SEEN

## 2012-03-11 ENCOUNTER — Encounter (HOSPITAL_COMMUNITY): Payer: Self-pay | Admitting: Emergency Medicine

## 2012-03-11 ENCOUNTER — Emergency Department (INDEPENDENT_AMBULATORY_CARE_PROVIDER_SITE_OTHER)
Admission: EM | Admit: 2012-03-11 | Discharge: 2012-03-11 | Disposition: A | Discharge status: Home / Self Care | Payer: Self-pay | Source: Home / Self Care | Attending: Emergency Medicine | Admitting: Emergency Medicine

## 2012-03-11 DIAGNOSIS — T148XXA Other injury of unspecified body region, initial encounter: Secondary | ICD-10-CM

## 2012-03-11 DIAGNOSIS — S80869A Insect bite (nonvenomous), unspecified lower leg, initial encounter: Secondary | ICD-10-CM

## 2012-03-11 DIAGNOSIS — S90569A Insect bite (nonvenomous), unspecified ankle, initial encounter: Secondary | ICD-10-CM

## 2012-03-11 DIAGNOSIS — L089 Local infection of the skin and subcutaneous tissue, unspecified: Secondary | ICD-10-CM

## 2012-03-11 DIAGNOSIS — T148 Other injury of unspecified body region: Secondary | ICD-10-CM

## 2012-03-11 MED ORDER — CEPHALEXIN 500 MG PO CAPS: 500.0000 mg | ORAL_CAPSULE | Freq: Four times a day (QID) | ORAL | Status: AC

## 2012-03-11 MED ORDER — EPINEPHRINE 0.3 MG/0.3ML IJ DEVI: 0.3000 mg | Freq: Once | INTRAMUSCULAR | Status: DC

## 2012-03-11 MED ORDER — DIPHENHYDRAMINE HCL 25 MG PO CAPS
ORAL_CAPSULE | ORAL | Status: AC
  Filled 2012-03-11: qty 2

## 2012-03-11 MED ORDER — FAMOTIDINE 20 MG PO TABS: 20.0000 mg | ORAL_TABLET | Freq: Two times a day (BID) | ORAL | Status: DC

## 2012-03-11 MED ORDER — METHYLPREDNISOLONE SODIUM SUCC 125 MG IJ SOLR
INTRAMUSCULAR | Status: AC
  Filled 2012-03-11: qty 2

## 2012-03-11 MED ORDER — LORATADINE 10 MG PO TABS: 10.0000 mg | ORAL_TABLET | Freq: Every day | ORAL | Status: DC

## 2012-03-11 MED ORDER — TETANUS-DIPHTH-ACELL PERTUSSIS 5-2.5-18.5 LF-MCG/0.5 IM SUSP
0.5000 mL | Freq: Once | INTRAMUSCULAR | Status: AC
  Administered 2012-03-11: 0.5 mL via INTRAMUSCULAR

## 2012-03-11 MED ORDER — PREDNISONE 20 MG PO TABS: 60.0000 mg | ORAL_TABLET | Freq: Every day | ORAL | Status: AC

## 2012-03-11 MED ORDER — DIPHENHYDRAMINE HCL 25 MG PO CAPS: 50.0000 mg | ORAL_CAPSULE | Freq: Once | ORAL | Status: DC

## 2012-03-11 MED ORDER — TETANUS-DIPHTH-ACELL PERTUSSIS 5-2.5-18.5 LF-MCG/0.5 IM SUSP
INTRAMUSCULAR | Status: AC
  Filled 2012-03-11: qty 0.5

## 2012-03-11 MED ORDER — METHYLPREDNISOLONE SODIUM SUCC 125 MG IJ SOLR
125.0000 mg | Freq: Once | INTRAMUSCULAR | Status: AC
  Administered 2012-03-11: 125 mg via INTRAMUSCULAR

## 2012-03-11 NOTE — ED Notes (Signed)
RN Selena Batten informed of Pt per EMT Clyda Hurdle

## 2012-03-11 NOTE — ED Notes (Signed)
Applied ice packs to left forearm
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
## 2012-03-11 NOTE — ED Notes (Signed)
Reports insect stings last night.  Reports one went up pant leg.  Site right lower leg.  This area is red, itching.  Patient then said several stung the back of left hand.  Today left forearm and hand is very swollen, red, painful to move.  Patient thinks insects were wasp, maybe.  Redness and swelling around fingers and 3/4 the distance around forearm and up to elbow.  Patient reports getting in the heat made her feel short of breath, but getting in cool air help breathing

## 2012-05-11 ENCOUNTER — Ambulatory Visit (INDEPENDENT_AMBULATORY_CARE_PROVIDER_SITE_OTHER): Payer: Self-pay | Admitting: Internal Medicine

## 2012-05-11 ENCOUNTER — Encounter: Payer: Self-pay | Admitting: Internal Medicine

## 2012-05-11 VITALS — BP 113/82 | HR 66 | Temp 96.8°F | Ht 65.0 in | Wt 129.8 lb

## 2012-05-11 DIAGNOSIS — M25519 Pain in unspecified shoulder: Secondary | ICD-10-CM

## 2012-05-11 NOTE — Patient Instructions (Signed)
1.  We will work to get you set up with sports medicine to help inject your shoulder.  - Please bring any copy of any images that you have from [facility] to your doctor's appointment at Sports medicine.  2.  In the meantime, Ice, and tylenol can help with the pain and any swelling that you are having.   3.  Follow up with [place] in about 1-2 months to see if there are anything else we can help you with.
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
## 2012-05-25 ENCOUNTER — Ambulatory Visit (INDEPENDENT_AMBULATORY_CARE_PROVIDER_SITE_OTHER): Payer: Self-pay | Admitting: Family Medicine

## 2012-05-25 VITALS — BP 128/80 | Ht 65.0 in | Wt 131.0 lb

## 2012-05-25 DIAGNOSIS — M25519 Pain in unspecified shoulder: Secondary | ICD-10-CM
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
## 2012-06-01 ENCOUNTER — Encounter: Payer: Self-pay | Admitting: Internal Medicine

## 2012-06-02 ENCOUNTER — Telehealth: Payer: Self-pay | Admitting: *Deleted

## 2012-06-02 NOTE — Telephone Encounter (Signed)
Pt calls and would like pain medicine called in for her shoulder pain. Please advise

## 2012-06-03 NOTE — Telephone Encounter (Signed)
Called and informed pt, she will call for appt

## 2012-06-03 NOTE — Telephone Encounter (Signed)
She has not been seen in clinic recently. She will need to be seen and evaluated before prescribing an meds. She can continue to take OTC pain meds in the meanwhile.

## 2012-06-10 ENCOUNTER — Encounter: Payer: Self-pay | Admitting: Internal Medicine

## 2012-06-10 ENCOUNTER — Ambulatory Visit (INDEPENDENT_AMBULATORY_CARE_PROVIDER_SITE_OTHER): Payer: Self-pay | Admitting: Internal Medicine

## 2012-06-10 VITALS — BP 127/89 | HR 92 | Temp 98.3°F | Wt 127.0 lb

## 2012-06-10 DIAGNOSIS — M67919 Unspecified disorder of synovium and tendon, unspecified shoulder: Secondary | ICD-10-CM

## 2012-06-10 DIAGNOSIS — M25519 Pain in unspecified shoulder: Secondary | ICD-10-CM

## 2012-06-10 MED ORDER — HYDROCODONE-ACETAMINOPHEN 5-325 MG PO TABS: 1.0000 | ORAL_TABLET | Freq: Every day | ORAL | Status: DC | PRN

## 2012-06-10 NOTE — Patient Instructions (Signed)
-I am prescribing Norco 5-325, 1-2 tablets daily before physical therapy.  It is extremely important that you do daily exercises to avoid a frozen shoulder (adhesive capsulitis, information below).  Therapy will be painful, and this medication is to help get you through it, because a frozen shoulder will be more painful.    Please be sure to bring all of your medications with you to every visit.  Should you have any new or worsening symptoms, please be sure to call the clinic at [phone].   Adhesive Capsulitis Sometimes the shoulder becomes stiff and is painful to move. Some people say it feels as if the shoulder is frozen in place. Because of this, the condition is called "frozen shoulder." Its medical name is adhesive capsulitis.  The shoulder joint is made up of strong connective tissue that attaches the ball of the humerus to the shallow shoulder socket. This strong connective tissue is called the joint capsule. This tissue can become stiff and swollen. That is when adhesive capsulitis sets in. CAUSES  It is not always clear just what the cause adhesive capsulitis. Possibilities include:  Injury to the shoulder joint.  Strain. This is a repetitive injury brought about by overuse.  Lack of use. Perhaps your arm or hand was otherwise injured. It might have been in a sling for awhile. Or perhaps you were not using it to avoid pain.  Referred pain. This is a sort of trick the body plays. You feel pain in the shoulder. But, the pain actually comes from an injury somewhere else in the body.  Long-standing health problems. Several diseases can cause adhesive capsulitis. They include diabetes, heart disease, stroke, thyroid problems, rheumatoid arthritis and lung disease.  Being a women older than 40. Anyone can develop adhesive capsulitis but it is most common in women in this age group. SYMPTOMS   Pain.  It occurs when the arm is moved.  Parts of the shoulder might hurt if they are  touched.  Pain is worse at night or when resting.  Soreness. It might not be strong enough to be called pain. But, the shoulder aches.  The shoulder does not move freely.  Muscle spasms.  Trouble sleeping because of shoulder ache or pain. DIAGNOSIS  To decide if you have adhesive capsulitis, your healthcare provider will probably:  Ask about symptoms you have noticed.  Ask about your history of joint pain and anything that might have caused the pain.  Ask about your overall health.  Use hands to feel your shoulder and neck.  Ask you to move your shoulder in specific directions. This may indicate the origin of the pain.  Order imaging tests; pictures of the shoulder. They help pinpoint the source of the problem. An X-ray might be used. For more detail, an MRI is often used. An MRI details the tendons, muscles and ligaments as well as the joint. TREATMENT  Adhesive capsulitis can be treated several ways. Most treatments can be done in a clinic or in your healthcare provider's office. Be sure to discuss the different options with your caregiver. They include:  Physical therapy. You will work on specific exercises to get your shoulder moving again. The exercises usually involve stretching. A physical therapist (a caregiver with special training) can show you what to do and what not to do. The exercises will need to be done daily.  Medication.  Over-the-counter medicines may relieve pain and inflammation (the body's way of reacting to injury or infection).  Corticosteroids. These  are stronger drugs to reduce pain and inflammation. They are given by injection (shots) into the shoulder joint. Frequent treatment is not recommended.  Muscle relaxants. Medication may be prescribed to ease muscle spasms.  Treatment of underlying conditions. This means treating another condition that is causing your shoulder problem. This might be a rotator cuff (tendon) problem  Shoulder manipulation.  The shoulder will be moved by your healthcare provider. You would be under general anesthesia (given a drug that puts you to sleep). You would not feel anything. Sometimes the joint will be injected with salt water (saline) at high pressure to break down internal scarring in the joint capsule.  Surgery. This is rarely needed. It may be suggested in advanced cases after all other treatment has failed. PROGNOSIS  In time, most people recover from adhesive capsulitis. Sometimes, however, the pain goes away but full movement of the shoulder does not return.  HOME CARE INSTRUCTIONS   Take any pain medications recommended by your healthcare provider. Follow the directions carefully.  If you have physical therapy, follow through with the therapist's suggestions. Be sure you understand the exercises you will be doing. You should understand:  How often the exercises should be done.  How many times each exercise should be repeated.  How long they should be done.  What other activities you should do, or not do.  That you should warm up before doing any exercise. Just 5 to 10 minutes will help. Small, gentle movements should get your shoulder ready for more.  Avoid high-demand exercise that involves your shoulder such as throwing. This type of exercise can make pain worse.  Consider using cold packs. Cold may ease swelling and pain. Ask your healthcare provider if a cold pack might help you. If so, get directions on how and when to use them. SEEK MEDICAL CARE IF:   You have any questions about your medications.  Your pain continues to increase. Document Released: 06/09/2009 Document Revised: 11/04/2011 Document Reviewed: 06/09/2009 [facility] Patient Information 2013 [place], [place].
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
## 2012-06-10 NOTE — Assessment & Plan Note (Addendum)
-  Norco 5-325, 1-2 tabs 1 hour prior to exercises, #60 for 1 month to be taken specifically before physical therapy daily.  PT (Rotator cuff program by sports medicine) can be painful and the pain medication should help her get through it.  I do not want her to be de-motivated by the pain, as then she may be at risk for adhesive capsulitis. This pain medication is specifically for use prior to home exercises.  At follow up, please try to perform full repeat shoulder exam, because after chart review, I am not certain that her limited abduction is as severe as  I initially suspected, and she did not cooperate for a full shoulder exam.  She will return to clinic in 1 month.

## 2012-07-03 ENCOUNTER — Ambulatory Visit (INDEPENDENT_AMBULATORY_CARE_PROVIDER_SITE_OTHER): Payer: Self-pay | Admitting: Family Medicine

## 2012-07-03 ENCOUNTER — Encounter: Payer: Self-pay | Admitting: Family Medicine

## 2012-07-03 VITALS — BP 136/89 | HR 71 | Ht 65.0 in | Wt 125.0 lb

## 2012-07-03 DIAGNOSIS — M25511 Pain in right shoulder: Secondary | ICD-10-CM

## 2012-07-03 DIAGNOSIS — M25519 Pain in unspecified shoulder: Secondary | ICD-10-CM

## 2012-07-03 DIAGNOSIS — M75101 Unspecified rotator cuff tear or rupture of right shoulder, not specified as traumatic: Secondary | ICD-10-CM

## 2012-07-03 DIAGNOSIS — M719 Bursopathy, unspecified: Secondary | ICD-10-CM

## 2012-07-03 NOTE — Assessment & Plan Note (Signed)
At this time and through patient's chart review it appears that patient has had significant amount of conservative therapy. Patient though has not had corticosteroid injections likely secondary to the partial thickness tear that was seen previously. At this point I think he would be good for patient to have a consult with an orthopedic surgeon. We will put the referral in the patient understands that this may take a while secondary to her not having any health insurance at the time. Patient was again asked for pain medications which we declined to give her. Patient can followup here as needed.

## 2012-07-03 NOTE — Patient Instructions (Signed)
I am sorry you are having so much pain still.  We will get you into an orthopedist here to see if they have anything else to offer.  Continue physical therapy.
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
## 2012-07-13 ENCOUNTER — Ambulatory Visit (INDEPENDENT_AMBULATORY_CARE_PROVIDER_SITE_OTHER): Payer: Self-pay | Admitting: Internal Medicine

## 2012-07-13 ENCOUNTER — Encounter: Payer: Self-pay | Admitting: Internal Medicine

## 2012-07-13 VITALS — BP 124/80 | HR 80 | Temp 98.4°F | Wt 124.3 lb

## 2012-07-13 DIAGNOSIS — M5412 Radiculopathy, cervical region: Secondary | ICD-10-CM

## 2012-07-13 DIAGNOSIS — M47812 Spondylosis without myelopathy or radiculopathy, cervical region: Secondary | ICD-10-CM

## 2012-07-13 DIAGNOSIS — M75101 Unspecified rotator cuff tear or rupture of right shoulder, not specified as traumatic: Secondary | ICD-10-CM

## 2012-07-13 DIAGNOSIS — T148XXA Other injury of unspecified body region, initial encounter: Secondary | ICD-10-CM

## 2012-07-13 DIAGNOSIS — M67919 Unspecified disorder of synovium and tendon, unspecified shoulder: Secondary | ICD-10-CM

## 2012-07-13 DIAGNOSIS — S90569A Insect bite (nonvenomous), unspecified ankle, initial encounter: Secondary | ICD-10-CM

## 2012-07-13 DIAGNOSIS — F329 Major depressive disorder, single episode, unspecified: Secondary | ICD-10-CM

## 2012-07-13 MED ORDER — AMITRIPTYLINE HCL 50 MG PO TABS: 50.0000 mg | ORAL_TABLET | Freq: Every day | ORAL | Status: DC

## 2012-07-13 MED ORDER — HYDROCODONE-ACETAMINOPHEN 10-500 MG PO TABS: 1.0000 | ORAL_TABLET | Freq: Four times a day (QID) | ORAL | Status: DC | PRN

## 2012-07-13 NOTE — Progress Notes (Signed)
  Subjective:    Patient ID: Julie Williams, female    DOB: 1966/04/28, 46 y.o.   MRN: [id number]  HPI Julie Williams is a 46 year old female who I have not seen since last >1 year. Patient is here today for a follow up of her right shoulder pain. She describes the pain is persistent, present with any movement of her shoulder, 10/10 at its worse, better with Vicodin and worse while doing therapy/any movement. She is unable to sleep at night due to pain. Patient has been seen by orthopedics Dr Darrick Penna in the past and found to be not a candidate for surgical correction. Patient was prescribed vicodin 5-325 at last office visit which relieved the pain a bit. She feels breakthrough pain and also pain is not controlled when she tries to exercise. She requests MRI of her shoulder.  Still has tingling and numbness in her left hand. She had a nerve conduction study done at [facility] but has not had a follow up.   Complains of poor sleep and feels depressed most of the time. No suicidal or homicidal ideation.  Still smoking but alternates with e cigs.  Inquires about DEXA scan and Pneumovax shot(refuses despite being eligible for it).    Review of Systems  Constitutional: Negative for fever, activity change and appetite change.  HENT: Negative for sore throat.   Respiratory: Negative for cough and shortness of breath.   Cardiovascular: Negative for chest pain and leg swelling.  Gastrointestinal: Negative for nausea, abdominal pain, diarrhea, constipation and abdominal distention.  Genitourinary: Negative for frequency, hematuria and difficulty urinating.  Musculoskeletal: Positive for arthralgias. Negative for myalgias, back pain, joint swelling and gait problem.  Neurological: Positive for weakness (left thumb) and numbness (left thumb). Negative for dizziness and headaches.  Psychiatric/Behavioral: Negative for suicidal ideas and behavioral problems.       Objective:   Physical Exam  Constitutional:  She is oriented to person, place, and time. She appears well-developed and well-nourished.  HENT:  Head: Normocephalic and atraumatic.  Eyes: Conjunctivae normal and EOM are normal. Pupils are equal, round, and reactive to light. No scleral icterus.  Neck: Normal range of motion. Neck supple. No JVD present. No thyromegaly present.  Cardiovascular: Normal rate, regular rhythm, normal heart sounds and intact distal pulses.  Exam reveals no gallop and no friction rub.   No murmur heard. Pulmonary/Chest: Effort normal and breath sounds normal. No respiratory distress. She has no wheezes. She has no rales.  Abdominal: Soft. Bowel sounds are normal. She exhibits no distension and no mass. There is no tenderness. There is no rebound and no guarding.  Musculoskeletal: She exhibits no edema and no tenderness.       Unable to examine right shoulder due to extreme pain with any movement. Left hand fingers including thumb with decreased sensations and restricted mobility.  Lymphadenopathy:    She has no cervical adenopathy.  Neurological: She is alert and oriented to person, place, and time.  Psychiatric: She has a normal mood and affect. Her behavior is normal.          Assessment & Plan:

## 2012-07-13 NOTE — Patient Instructions (Signed)

## 2012-07-13 NOTE — Assessment & Plan Note (Signed)
No suicidal or homicidal ideation at this time. Prescribed amitriptyline for help with sleep, nerve pain and depression. Start with 50 mg daily and go up at next office visit. Patient is agreeable to the plan.

## 2012-07-13 NOTE — Assessment & Plan Note (Signed)
Prescribed vicodin for pain control. Follow up in 1 month for review of meds and pain contract if current dose is effective. Amitriptyline for help with sleep, nerve pain and depression. Start with 50 mg daily and go up at next office visit.

## 2012-07-13 NOTE — Assessment & Plan Note (Signed)
Patient was advised to follow up at [facility] as they have been following up on it. Prescribed amitriptyline for help with sleep, nerve pain and depression. Start with 50 mg daily and go up at next office visit.

## 2012-08-13 ENCOUNTER — Encounter: Payer: Self-pay | Admitting: Internal Medicine

## 2012-08-13 ENCOUNTER — Ambulatory Visit (INDEPENDENT_AMBULATORY_CARE_PROVIDER_SITE_OTHER): Payer: Self-pay | Admitting: Internal Medicine

## 2012-08-13 VITALS — BP 131/87 | HR 75 | Temp 98.3°F | Wt 126.8 lb

## 2012-08-13 DIAGNOSIS — M75101 Unspecified rotator cuff tear or rupture of right shoulder, not specified as traumatic: Secondary | ICD-10-CM

## 2012-08-13 DIAGNOSIS — F329 Major depressive disorder, single episode, unspecified: Secondary | ICD-10-CM

## 2012-08-13 DIAGNOSIS — M5126 Other intervertebral disc displacement, lumbar region: Secondary | ICD-10-CM

## 2012-08-13 DIAGNOSIS — F17200 Nicotine dependence, unspecified, uncomplicated: Secondary | ICD-10-CM

## 2012-08-13 DIAGNOSIS — M719 Bursopathy, unspecified: Secondary | ICD-10-CM

## 2012-08-13 DIAGNOSIS — M5412 Radiculopathy, cervical region: Secondary | ICD-10-CM

## 2012-08-13 MED ORDER — AMITRIPTYLINE HCL 100 MG PO TABS: 100.0000 mg | ORAL_TABLET | Freq: Every day | ORAL | Status: DC

## 2012-08-13 MED ORDER — MORPHINE SULFATE 15 MG PO TABS: 15.0000 mg | ORAL_TABLET | ORAL | Status: DC | PRN

## 2012-08-13 NOTE — Assessment & Plan Note (Signed)
Counseled for smoking cessation 

## 2012-08-13 NOTE — Assessment & Plan Note (Signed)
Chronic and stable. Imaging reviewed. Patient does not want to go to her old neurosurgeon.

## 2012-08-13 NOTE — Assessment & Plan Note (Addendum)
Increased pain medicine today by changing to morphine 15 mg. I have given her 60 tabs today and will refill if she feels this dose is helping. She was on vicodin 10-325 which I discontinued today.  I gave her the options between 1. Pain medicine physician 2. Orthopedics for surgical correction of shoulder injury 3. Continued medications for pain with our clinic.  She will think about these options and come up with an answer for [place]. I also increased her amitriptyline to 100 mg at night for pain control, depression and sleep problems.

## 2012-08-13 NOTE — Progress Notes (Signed)
  Subjective:    Patient ID: Julie Williams, female    DOB: 1966/07/15, 46 y.o.   MRN: [id number]  HPI  Julie Williams is here today for follow up of her shoulder pain.  Pain is not better with increased dose of Vicodin 10-325 and she requests better pain medicine.  Her sleep is better but interrupted by right shoulder pain. She has been taking amitriptyline regularly.  No other complaints.  Review of Systems  All other systems reviewed and are negative.       Objective:   Physical Exam  Vitals reviewed. Constitutional: She is oriented to person, place, and time. She appears well-developed and well-nourished.  HENT:  Head: Normocephalic and atraumatic.  Eyes: Conjunctivae normal and EOM are normal. Pupils are equal, round, and reactive to light. No scleral icterus.  Neck: Neck supple. No JVD present. No thyromegaly present.       Unable to examine completely due to pain  Cardiovascular: Normal rate, regular rhythm, normal heart sounds and intact distal pulses.  Exam reveals no gallop and no friction rub.   No murmur heard. Pulmonary/Chest: Effort normal and breath sounds normal. No respiratory distress. She has no wheezes. She has no rales.  Abdominal: Soft. Bowel sounds are normal. She exhibits no distension and no mass. There is no tenderness. There is no rebound and no guarding.  Musculoskeletal: She exhibits tenderness. She exhibits no edema.       Right shoulder painful to move in all directions  Lymphadenopathy:    She has no cervical adenopathy.  Neurological: She is alert and oriented to person, place, and time.  Psychiatric: She has a normal mood and affect. Her behavior is normal.          Assessment & Plan:

## 2012-08-13 NOTE — Patient Instructions (Signed)

## 2012-08-13 NOTE — Assessment & Plan Note (Signed)
Increased amitriptyline.

## 2012-08-17 ENCOUNTER — Other Ambulatory Visit: Payer: Self-pay | Admitting: *Deleted

## 2012-08-17 NOTE — Telephone Encounter (Signed)
Pt called stating she took 2 MSIR 15 mg pills and it worked very well for her. She is asking you to  increase this med for her to MSIR 30 mg. Pt # [phone]

## 2012-08-21 ENCOUNTER — Telehealth: Payer: Self-pay | Admitting: *Deleted

## 2012-08-21 NOTE — Telephone Encounter (Signed)
Patient has enough until Monday 12/30. I will refill the new prescriptions on Monday. Ask her to come and pick it up that day as I am ii clinic all day.

## 2012-08-24 ENCOUNTER — Other Ambulatory Visit: Payer: Self-pay | Admitting: Internal Medicine

## 2012-08-24 MED ORDER — MORPHINE SULFATE 30 MG PO TABS: ORAL_TABLET | ORAL | Status: DC

## 2012-08-24 NOTE — Telephone Encounter (Signed)
review 

## 2012-08-24 NOTE — Progress Notes (Signed)
Patient has tried several different pain medications since last 4 years including ibuprofen, sulindac, tylenol, tramadol, naproxen, vicodin, percocet which are unable to control her shoulder pain and neck pain. Patient got operated at [facility] in 2012 for her cervical compression. She is in the process of deciding which way she want to move with her right shoulder. The options given to her at last appointment with me were 1. Pain clinic with Dr. Wynn Banker who can do nerve blocks. 2. Orthopedics surgical referral and get operated. 3. Continue pain medications and conservative therapy including joint injections with physical therapy and sports medicine. I will continue her on MSIR 30 mg 120 pills for a month for now as this seems to have helped her pain. Please sign a pain contract and follow up on above options at next office visit.  Sharaine Delange Eben Burow

## 2012-08-28 NOTE — Telephone Encounter (Signed)
meds refilled 

## 2012-09-16 ENCOUNTER — Other Ambulatory Visit: Payer: Self-pay | Admitting: *Deleted

## 2012-09-16 NOTE — Telephone Encounter (Signed)
Last filled 12/30

## 2012-09-21 MED ORDER — MORPHINE SULFATE 30 MG PO TABS: ORAL_TABLET | ORAL | Status: DC

## 2012-09-21 MED ORDER — MORPHINE SULFATE 30 MG PO TABS
ORAL_TABLET | ORAL | Status: DC
Start: 2012-09-16 — End: 2012-09-21

## 2012-09-21 NOTE — Telephone Encounter (Signed)
Saw Dr Eben Burow Dec 2013. March F/U. Will refill until then

## 2012-10-19 ENCOUNTER — Encounter: Payer: Self-pay | Admitting: Internal Medicine

## 2012-10-19 ENCOUNTER — Ambulatory Visit (INDEPENDENT_AMBULATORY_CARE_PROVIDER_SITE_OTHER): Payer: Self-pay | Admitting: Internal Medicine

## 2012-10-19 ENCOUNTER — Other Ambulatory Visit: Payer: Self-pay | Admitting: Internal Medicine

## 2012-10-19 VITALS — BP 132/82 | HR 70 | Temp 97.9°F | Ht 65.5 in | Wt 127.3 lb

## 2012-10-19 DIAGNOSIS — I10 Essential (primary) hypertension: Secondary | ICD-10-CM

## 2012-10-19 DIAGNOSIS — F3289 Other specified depressive episodes: Secondary | ICD-10-CM

## 2012-10-19 DIAGNOSIS — G894 Chronic pain syndrome: Secondary | ICD-10-CM

## 2012-10-19 DIAGNOSIS — Z860100 Personal history of colon polyps, unspecified: Secondary | ICD-10-CM

## 2012-10-19 DIAGNOSIS — F17200 Nicotine dependence, unspecified, uncomplicated: Secondary | ICD-10-CM

## 2012-10-19 MED ORDER — AMITRIPTYLINE HCL 100 MG PO TABS: 100.0000 mg | ORAL_TABLET | Freq: Every day | ORAL | Status: DC

## 2012-10-19 MED ORDER — MORPHINE SULFATE 30 MG PO TABS: ORAL_TABLET | ORAL | Status: DC

## 2012-10-19 MED ORDER — AMOXICILLIN 500 MG PO TABS: 500.0000 mg | ORAL_TABLET | Freq: Two times a day (BID) | ORAL | Status: DC

## 2012-10-19 NOTE — Progress Notes (Signed)
  Subjective:    Patient ID: Julie Williams, female    DOB: Jul 19, 1966, 47 y.o.   MRN: [id number]  HPI  Patient comes in today for follow up of her chronic neck pain and depression.  She complains that ever since she started the amitryptaline at 100 mg a night dose, she has been sleeping very well. She said that she probably sleeps for 12 hours which is too much and has to really force her to get up.  Her depression is much better now. The pain is well controlled.  No other complaints at this time.  Review of Systems  All other systems reviewed and are negative.       Objective:   Physical Exam  Vitals reviewed. Constitutional: She is oriented to person, place, and time. She appears well-developed and well-nourished.  HENT:  Head: Normocephalic and atraumatic.  Eyes: Conjunctivae and EOM are normal. Pupils are equal, round, and reactive to light. No scleral icterus.  Neck: Neck supple. No JVD present. No thyromegaly present.  Unable to examine completely due to pain  Cardiovascular: Normal rate, regular rhythm, normal heart sounds and intact distal pulses.  Exam reveals no gallop and no friction rub.   No murmur heard. Pulmonary/Chest: Effort normal and breath sounds normal. No respiratory distress. She has no wheezes. She has no rales.  Abdominal: Soft. Bowel sounds are normal. She exhibits no distension and no mass. There is no tenderness. There is no rebound and no guarding.  Musculoskeletal: She exhibits tenderness. She exhibits no edema.  Right shoulder painful to move in all directions  Lymphadenopathy:    She has no cervical adenopathy.  Neurological: She is alert and oriented to person, place, and time.  Psychiatric: She has a normal mood and affect. Her behavior is normal.          Assessment & Plan:

## 2012-10-19 NOTE — Assessment & Plan Note (Signed)
Patient inquired about virtual colonoscopy at this time.  To follow up with GI as recommended.

## 2012-10-19 NOTE — Assessment & Plan Note (Signed)
Well-controlled  at this time 

## 2012-10-19 NOTE — Assessment & Plan Note (Signed)
We signed a pain contract today for morphine 120 tabs of 30 mg a month. UDS today. Continue amitryptaline.

## 2012-10-19 NOTE — Assessment & Plan Note (Signed)
Patient trying to quit.

## 2012-10-19 NOTE — Patient Instructions (Addendum)
Steam inhalation twice a day. Warm water gargles 2-3 times a day. Please take your medications as advised.

## 2012-10-19 NOTE — Assessment & Plan Note (Signed)
I will refill amitriptyline for 3 months at this time as this seems to be working.

## 2012-10-27 ENCOUNTER — Other Ambulatory Visit: Payer: Self-pay | Admitting: *Deleted

## 2012-10-27 MED ORDER — AMITRIPTYLINE HCL 100 MG PO TABS: 100.0000 mg | ORAL_TABLET | Freq: Every day | ORAL | Status: DC

## 2012-11-02 ENCOUNTER — Ambulatory Visit: Payer: Self-pay

## 2012-11-12 ENCOUNTER — Encounter (HOSPITAL_COMMUNITY): Payer: Self-pay | Admitting: *Deleted

## 2012-11-12 ENCOUNTER — Emergency Department (INDEPENDENT_AMBULATORY_CARE_PROVIDER_SITE_OTHER): Payer: Self-pay

## 2012-11-12 ENCOUNTER — Emergency Department (INDEPENDENT_AMBULATORY_CARE_PROVIDER_SITE_OTHER)
Admission: EM | Admit: 2012-11-12 | Discharge: 2012-11-12 | Disposition: A | Discharge status: Home / Self Care | Payer: Self-pay | Source: Home / Self Care | Attending: Emergency Medicine | Admitting: Emergency Medicine

## 2012-11-12 DIAGNOSIS — J209 Acute bronchitis, unspecified: Secondary | ICD-10-CM

## 2012-11-12 MED ORDER — BENZONATATE 200 MG PO CAPS: 200.0000 mg | ORAL_CAPSULE | Freq: Three times a day (TID) | ORAL | Status: DC | PRN

## 2012-11-12 MED ORDER — AMOXICILLIN 500 MG PO CAPS: 1000.0000 mg | ORAL_CAPSULE | Freq: Three times a day (TID) | ORAL | Status: DC

## 2012-11-12 MED ORDER — PREDNISONE 20 MG PO TABS: 20.0000 mg | ORAL_TABLET | Freq: Two times a day (BID) | ORAL | Status: DC

## 2012-11-12 MED ORDER — ALBUTEROL SULFATE HFA 108 (90 BASE) MCG/ACT IN AERS: 2.0000 | INHALATION_SPRAY | Freq: Four times a day (QID) | RESPIRATORY_TRACT | Status: DC

## 2012-11-12 NOTE — ED Provider Notes (Signed)
Chief Complaint:   Chief Complaint  Patient presents with  . Cough    History of Present Illness:   Julie Williams is a 47 year old female with a 2-1/2 week history of fever of up to 102, chills, fatigue, cough productive yellow-green, bloody drainage, wheezing, nasal congestion with yellow-green drainage, sinus pressure, ear drainage, sore throat, and postnasal drainage. She smokes about 6 cigarettes per day.  Review of Systems:  Other than noted above, the patient denies any of the following symptoms: Systemic:  No fevers, chills, sweats, weight loss or gain, fatigue, or tiredness. Eye:  No redness or discharge. ENT:  No ear pain, drainage, headache, nasal congestion, drainage, sinus pressure, difficulty swallowing, or sore throat. Neck:  No neck pain or swollen glands. Lungs:  No cough, sputum production, hemoptysis, wheezing, chest tightness, shortness of breath or chest pain. GI:  No abdominal pain, nausea, vomiting or diarrhea.  PMFSH:  Past medical history, family history, social history, meds, and allergies were reviewed.  Physical Exam:   Vital signs:  BP 99/67  Pulse 65  Temp(Src) 98.4 F (36.9 C) (Oral)  Resp 16  SpO2 100% General:  Alert and oriented.  In no distress.  Skin warm and dry. Eye:  No conjunctival injection or drainage. Lids were normal. ENT:  TMs and canals were normal, without erythema or inflammation.  Nasal mucosa was clear and uncongested, without drainage.  Mucous membranes were moist.  Pharynx was clear with no exudate or drainage.  There were no oral ulcerations or lesions. Neck:  Supple, no adenopathy, tenderness or mass. Lungs:  No respiratory distress.  Lungs were clear to auscultation, without wheezes, rales or rhonchi.  Breath sounds were clear and equal bilaterally.  Heart:  Regular rhythm, without gallops, murmers or rubs. Skin:  Clear, warm, and dry, without rash or lesions.  Radiology:  Dg Chest 2 View  11/12/2012  *RADIOLOGY REPORT*   Clinical Data: Productive cough.  CHEST - 2 VIEW  Comparison: 05/02/2009.  Findings: The cardiac silhouette, mediastinal and hilar contours are normal.  The lungs are clear.  Mild hyper inflation.  No pleural effusion.  The bony thorax is intact.  IMPRESSION: Mild hyperinflation but no infiltrates, edema or effusions.   Original Report Authenticated By: Rudie Meyer, M.D.    Assessment:  The encounter diagnosis was Acute bronchitis.  Plan:   1.  The following meds were prescribed:   Discharge Medication List as of 11/12/2012  5:06 PM    START taking these medications   Details  albuterol (PROVENTIL HFA;VENTOLIN HFA) 108 (90 BASE) MCG/ACT inhaler Inhale 2 puffs into the lungs 4 (four) times daily., Starting 11/12/2012, Until Discontinued, Normal    amoxicillin (AMOXIL) 500 MG capsule Take 2 capsules (1,000 mg total) by mouth 3 (three) times daily., Starting 11/12/2012, Until Discontinued, Normal    benzonatate (TESSALON) 200 MG capsule Take 1 capsule (200 mg total) by mouth 3 (three) times daily as needed for cough., Starting 11/12/2012, Until Discontinued, Normal    predniSONE (DELTASONE) 20 MG tablet Take 1 tablet (20 mg total) by mouth 2 (two) times daily., Starting 11/12/2012, Until Discontinued, Normal       2.  The patient was instructed in symptomatic care and handouts were given. She was strongly encouraged to quit smoking. 3.  The patient was told to return if becoming worse in any way, if no better in 3 or 4 days, and given some red flag symptoms such as worsening fever, difficulty breathing, chest pain, or intractable  vomiting that would indicate earlier return.      Reuben Likes, MD 11/12/12 2104

## 2012-11-12 NOTE — ED Notes (Signed)
Pt  Reports    Productive      Cough           X  2  1/2  Weeks          With        Congestion    And  Fever  Earlier        She  Is  Masked           And      Is  In a  Private  Room           She  Is  Speaking  In   Complete  sentances

## 2012-12-16 NOTE — Addendum Note (Signed)
Addended by: Remus Blake on: 12/16/2012 11:34 AM   Modules accepted: Orders

## 2012-12-21 ENCOUNTER — Ambulatory Visit (INDEPENDENT_AMBULATORY_CARE_PROVIDER_SITE_OTHER): Payer: Self-pay | Admitting: Internal Medicine

## 2012-12-21 ENCOUNTER — Encounter: Payer: Self-pay | Admitting: Internal Medicine

## 2012-12-21 VITALS — BP 109/70 | HR 66 | Temp 97.0°F | Ht 64.75 in | Wt 131.9 lb

## 2012-12-21 DIAGNOSIS — G894 Chronic pain syndrome: Secondary | ICD-10-CM

## 2012-12-21 DIAGNOSIS — F17200 Nicotine dependence, unspecified, uncomplicated: Secondary | ICD-10-CM

## 2012-12-21 DIAGNOSIS — M5412 Radiculopathy, cervical region: Secondary | ICD-10-CM

## 2012-12-21 DIAGNOSIS — I10 Essential (primary) hypertension: Secondary | ICD-10-CM

## 2012-12-21 DIAGNOSIS — F3289 Other specified depressive episodes: Secondary | ICD-10-CM

## 2012-12-21 DIAGNOSIS — F329 Major depressive disorder, single episode, unspecified: Secondary | ICD-10-CM

## 2012-12-21 MED ORDER — MORPHINE SULFATE 30 MG PO TABS: ORAL_TABLET | ORAL | Status: DC

## 2012-12-21 MED ORDER — SENNA-DOCUSATE SODIUM 8.6-50 MG PO TABS: 1.0000 | ORAL_TABLET | Freq: Two times a day (BID) | ORAL | Status: DC | PRN

## 2012-12-21 NOTE — Patient Instructions (Addendum)

## 2012-12-21 NOTE — Progress Notes (Signed)
  Subjective:    Patient ID: Julie Williams, female    DOB: 1966/08/20, 46 y.o.   MRN: [id number]  HPI Patient is here today for a regular follow up.  She is complaining of constipation. Patient is having BM every 2-3 days and hard and painful. Patient has tried OTC stool softeners and fiber therapy which has not helped.  Depression is still the same. She does not want to be referred to a therapist as she feels that "nothing is going to change my situation". She refused to increase the dose of amitriptyline any further.  Patient refuses mammogram and is up to date on her other tests for screening.  She wants to have 3 refill prescriptions of morphine so that she does not have to call back the clinic and wait for the refills to be approved which usually delays the refills.     Review of Systems  Constitutional: Negative for fever, activity change and appetite change.  HENT: Negative for sore throat.   Respiratory: Negative for cough and shortness of breath.   Cardiovascular: Negative for chest pain and leg swelling.  Gastrointestinal: Negative for nausea, abdominal pain, diarrhea, constipation and abdominal distention.  Genitourinary: Negative for frequency, hematuria and difficulty urinating.  Musculoskeletal: Positive for arthralgias. Negative for myalgias, back pain, joint swelling and gait problem.  Neurological: Positive for weakness (left thumb) and numbness (left thumb). Negative for dizziness and headaches.  Psychiatric/Behavioral: Negative for suicidal ideas and behavioral problems.       Objective:   Physical Exam  Vitals reviewed. Constitutional: She is oriented to person, place, and time. She appears well-developed and well-nourished.  HENT:  Head: Normocephalic and atraumatic.  Eyes: Conjunctivae and EOM are normal. Pupils are equal, round, and reactive to light. No scleral icterus.  Neck: Neck supple. No JVD present. No thyromegaly present.  Unable to examine  completely due to pain  Cardiovascular: Normal rate, regular rhythm, normal heart sounds and intact distal pulses.  Exam reveals no gallop and no friction rub.   No murmur heard. Pulmonary/Chest: Effort normal and breath sounds normal. No respiratory distress. She has no wheezes. She has no rales.  Abdominal: Soft. Bowel sounds are normal. She exhibits no distension and no mass. There is no tenderness. There is no rebound and no guarding.  Musculoskeletal: She exhibits tenderness. She exhibits no edema.  Right shoulder painful to move in all directions  Lymphadenopathy:    She has no cervical adenopathy.  Neurological: She is alert and oriented to person, place, and time.  Psychiatric: She has a normal mood and affect. Her behavior is normal.          Assessment & Plan:

## 2012-12-22 NOTE — Assessment & Plan Note (Signed)
Patient is sleeping better than in the past. Still taking 100 mg amitryptaline daily. Refuses to see a therapist and refuses to increase the dose of medications.

## 2012-12-22 NOTE — Assessment & Plan Note (Signed)
Given 3 prescriptions of 120 tabs of morphine 30 mg as per pain contract good until July 30th 2014.

## 2012-12-22 NOTE — Assessment & Plan Note (Signed)
Patient on chronic pain regimen. Pain contract in chart. Low abuse potential.

## 2012-12-22 NOTE — Assessment & Plan Note (Signed)
Well controlled today.

## 2012-12-22 NOTE — Assessment & Plan Note (Signed)
Still smoking but electronic cigarettes only.  Counseled about the unknown side effects of e cigs.

## 2013-01-04 ENCOUNTER — Emergency Department (INDEPENDENT_AMBULATORY_CARE_PROVIDER_SITE_OTHER)
Admission: EM | Admit: 2013-01-04 | Discharge: 2013-01-04 | Disposition: A | Discharge status: Home / Self Care | Payer: Self-pay | Source: Home / Self Care

## 2013-01-04 ENCOUNTER — Encounter (HOSPITAL_COMMUNITY): Payer: Self-pay | Admitting: *Deleted

## 2013-01-04 DIAGNOSIS — J029 Acute pharyngitis, unspecified: Secondary | ICD-10-CM

## 2013-01-04 LAB — POCT RAPID STREP A: Streptococcus, Group A Screen (Direct): NEGATIVE

## 2013-01-04 MED ORDER — AMOXICILLIN 500 MG PO CAPS: 500.0000 mg | ORAL_CAPSULE | Freq: Three times a day (TID) | ORAL | Status: DC

## 2013-01-04 NOTE — ED Notes (Signed)
Pt  Reports  Symptoms  Of  sorethroat  With white   Spots  In    Mouth as  Well  As  A  Rash  On    l  Leg  And   l  Arm   Which  She   Reports  It is  Itching  -  She  Is  Sitting  Upright on exam table  Speaking in  Complete  sentances  And  Is  In no  Severe    Distress
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
## 2013-01-04 NOTE — ED Provider Notes (Signed)
Medical screening examination/treatment/procedure(s) were performed by non-physician practitioner and as supervising physician I was immediately available for consultation/collaboration.  Raynald Blend, MD 01/04/13 1645

## 2013-01-11 ENCOUNTER — Encounter: Payer: Self-pay | Admitting: Internal Medicine

## 2013-02-08 ENCOUNTER — Ambulatory Visit (INDEPENDENT_AMBULATORY_CARE_PROVIDER_SITE_OTHER): Payer: Self-pay | Admitting: Internal Medicine

## 2013-02-08 VITALS — BP 121/79 | HR 71 | Temp 99.3°F | Ht 65.5 in | Wt 127.9 lb

## 2013-02-08 DIAGNOSIS — R109 Unspecified abdominal pain: Secondary | ICD-10-CM | POA: Insufficient documentation

## 2013-02-08 LAB — CBC WITH DIFFERENTIAL/PLATELET
Hemoglobin: 14.6 g/dL (ref 12.0–15.0)
Lymphocytes Relative: 39 % (ref 12–46)
Lymphs Abs: 2.4 10*3/uL (ref 0.7–4.0)
Monocytes Relative: 8 % (ref 3–12)
Neutro Abs: 3.2 10*3/uL (ref 1.7–7.7)
Neutrophils Relative %: 52 % (ref 43–77)
Platelets: 228 10*3/uL (ref 150–400)
RBC: 4.65 MIL/uL (ref 3.87–5.11)
WBC: 6.1 10*3/uL (ref 4.0–10.5)
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
## 2013-02-08 NOTE — Progress Notes (Signed)
  Subjective:    Patient ID: Julie Williams, female    DOB: 06/25/1966, 47 y.o.   MRN: [id number]  HPI  Julie Williams is here for eval of her ABD pain and altered bowel movements. About 2 weeks ago on a Sat, she woke up early and had 8 hours of sever ABD pain that was squeezing / spasms. It was assoc with vomiting without blood. Her stools were white / clay coloured and watery. The pain was located epigastric and RUQ / LUQ. After 8 hours, her sxs resolved except for some aching in the ABD. Her stools have been solid except for the two Sat but remain pale coloured.   The next Sat (2 days ago) the same sxs returned and again lasted most of the day, resolved except some soreness.   She has decreased appetite bc fear of having the pain return but was able to eat a regular meal yesterday.   She has a h/o opioid induced constipation and was on Senna but she increased her intake of greens and was able to get off the Senna.  Of note, she stopped her Elavil about two months ago on her own.   Of note, she has had ABX monthly for the past 3 months. Most recently Amoxil in May.   She is on well water and recently had the piping worked on and her water was rust coloured for a while. She lives alone.   Review of Systems  Constitutional: Positive for appetite change. Negative for activity change and unexpected weight change.  HENT: Positive for sore throat.   Respiratory: Negative for chest tightness.   Cardiovascular: Negative for palpitations.  Gastrointestinal: Positive for vomiting, abdominal pain and diarrhea. Negative for constipation and blood in stool.  Endocrine: Negative for polyphagia.  Musculoskeletal: Positive for back pain.  Skin: Negative for rash.       Objective:   Physical Exam  Constitutional: She appears well-developed and well-nourished. She is active.  Non-toxic appearance. She does not have a sickly appearance. She appears ill. No distress.  Mild distress  HENT:  Head:  Normocephalic and atraumatic.  Right Ear: External ear normal.  Left Ear: External ear normal.  Eyes: Conjunctivae and EOM are normal. Right eye exhibits no discharge.  Neck: Normal range of motion. Neck supple. No thyromegaly present.  Cardiovascular: Normal rate, regular rhythm and normal heart sounds.   Pulmonary/Chest: Effort normal and breath sounds normal.  Abdominal: Soft. Bowel sounds are normal. She exhibits no distension. There is tenderness. There is no rebound.  Throughout but I examined after Julie Williams  Musculoskeletal: Normal range of motion.  Lymphadenopathy:    She has no cervical adenopathy.  Neurological: She is alert.  Skin: Skin is warm and dry. No rash noted. She is not diaphoretic. No erythema. No pallor.  Psychiatric: Her behavior is normal. Judgment and thought content normal.          Assessment & Plan:

## 2013-02-08 NOTE — Assessment & Plan Note (Addendum)
2 acute episodes of upper abdominal pain with nonbloody-nonbilious vomiting and loose stools over the past 2 weeks. Clay-colored stools present throughout the past 2 weeks. Increased belching and flatus. She has exposures to multiple courses of antibiotics over the past few months, well water with recent disturbance, and increased intake of green leafy vegetables. Abrupt discontinuation of amitriptyline appears to be 2 months ago. Discontinuation of senna is more recent. Diarrhea with belching and flatus in conjunction with well water concern for giardia or other parasites. Acholic stools with abdominal pain and diarrhea concerning for hepatitis. - Work up with labs to include CBC with diff, CMP, Lipase, and Hepatitis Acute Panel - Stool culture   Attending H&P: Dx not clear at this point but she is able to maintain hydration so can be addressed as an outpt. Differential includes 1. INFXN - C diff 2/2 ABX exposures but stools are formed so no C diff toxin indicated. Giardia / other parasite 2/2 well water and recent well manipulation so will check stool for cx and O&P. Increased greens so check acute hep panel. 2. Elavil withdrawal - but timing is not accurate 3. Gall bladder / liver abnl - no fever and not toxic so doubt acute cholecystitis. Pancreatitis is possible. Check LFT's, lipase, CBC. 4. Obstruction - less likely as having formed BM, just altered colour.   We will call her with results and arrange appropriate F/U.

## 2013-02-08 NOTE — Patient Instructions (Signed)
You were seen in clinic today for abdominal pain, vomiting, and diarrhea. We did some blood work in clinic today and sent you home with a stool sample kit. Please follow the instructions given to you in our lab for the stool sample, and we will contact you with the results of these labs when they become available. If you do not hear back from [place] in 3 days, please contact us.  If you are feeling worse or develop worsening pain, fever, or dark or bloody stools, please seek medical care immediately. Please keep your other scheduled appointments.    Abdominal Pain Abdominal pain can be caused by many things. Your caregiver decides the seriousness of your pain by an examination and possibly blood tests and X-rays. Many cases can be observed and treated at home. Most abdominal pain is not caused by a disease and will probably improve without treatment. However, in many cases, more time must pass before a clear cause of the pain can be found. Before that point, it may not be known if you need more testing, or if hospitalization or surgery is needed. HOME CARE INSTRUCTIONS   Do not take laxatives unless directed by your caregiver.  Take pain medicine only as directed by your caregiver.  Only take over-the-counter or prescription medicines for pain, discomfort, or fever as directed by your caregiver.  Try a clear liquid diet (broth, tea, or water) for as long as directed by your caregiver. Slowly move to a bland diet as tolerated. SEEK IMMEDIATE MEDICAL CARE IF:   The pain does not go away.  You have a fever.  You keep throwing up (vomiting).  The pain is felt only in portions of the abdomen. Pain in the right side could possibly be appendicitis. In an adult, pain in the left lower portion of the abdomen could be colitis or diverticulitis.  You pass bloody or black tarry stools. MAKE SURE YOU:   Understand these instructions.  Will watch your condition.  Will get help right away if you are  not doing well or get worse. Document Released: 05/22/2005 Document Revised: 11/04/2011 Document Reviewed: 03/30/2008 [facility] Patient Information 2014 [place], [place].

## 2013-02-09 LAB — COMPLETE METABOLIC PANEL WITH GFR
ALT: 12 U/L (ref 0–35)
Albumin: 4.6 g/dL (ref 3.5–5.2)
Alkaline Phosphatase: 81 U/L (ref 39–117)
CO2: 26 mEq/L (ref 19–32)
Chloride: 103 mEq/L (ref 96–112)
GFR, Est African American: >89 mL/min
GFR, Est Non African American: 78 mL/min
Glucose, Bld: 101 mg/dL — ABNORMAL HIGH (ref 70–99)
Potassium: 5.3 mEq/L (ref 3.5–5.3)
Sodium: 142 mEq/L (ref 135–145)
Total Bilirubin: 0.4 mg/dL (ref 0.3–1.2)
Total Protein: 6.8 g/dL (ref 6.0–8.3)

## 2013-02-09 LAB — HEPATITIS PANEL, ACUTE
HCV Ab: NEGATIVE
Hep A IgM: NEGATIVE
Hep B C IgM: NEGATIVE

## 2013-02-10 ENCOUNTER — Other Ambulatory Visit: Payer: Self-pay

## 2013-02-10 DIAGNOSIS — R109 Unspecified abdominal pain: Secondary | ICD-10-CM

## 2013-02-14 LAB — STOOL CULTURE

## 2013-02-17 NOTE — Assessment & Plan Note (Signed)
She has a rotator cuff syndrome of the right shoulder with a previous MRI showing a partial tear.  She has not been doing her home exercises.  We discussed shoulder injection today vs possible imaging and then shoulder injection and she states that she would rather go to sports medicine to get it done.  WE will make the referral today.

## 2013-02-22 ENCOUNTER — Ambulatory Visit: Payer: Self-pay | Admitting: Internal Medicine

## 2013-02-24 ENCOUNTER — Other Ambulatory Visit: Payer: Self-pay | Admitting: Internal Medicine

## 2013-02-25 ENCOUNTER — Ambulatory Visit (INDEPENDENT_AMBULATORY_CARE_PROVIDER_SITE_OTHER): Payer: Self-pay | Admitting: Internal Medicine

## 2013-02-25 ENCOUNTER — Encounter: Payer: Self-pay | Admitting: Internal Medicine

## 2013-02-25 VITALS — BP 101/65 | HR 62 | Temp 97.3°F | Ht 65.5 in | Wt 130.0 lb

## 2013-02-25 DIAGNOSIS — F17200 Nicotine dependence, unspecified, uncomplicated: Secondary | ICD-10-CM

## 2013-02-25 DIAGNOSIS — J392 Other diseases of pharynx: Secondary | ICD-10-CM

## 2013-02-25 DIAGNOSIS — R0989 Other specified symptoms and signs involving the circulatory and respiratory systems: Secondary | ICD-10-CM | POA: Insufficient documentation

## 2013-02-25 MED ORDER — MORPHINE SULFATE 30 MG PO TABS: ORAL_TABLET | ORAL | Status: DC

## 2013-02-25 NOTE — Patient Instructions (Addendum)
-   We will arrange a referral to [facility] for ENT - Please see me in 3 months for follow up - If you have problems or symptoms before, call the clinic for an appointment  Thank you for your visit.

## 2013-02-25 NOTE — Progress Notes (Signed)
Case discussed with Dr. Cater (at time of visit, soon after the resident saw the patient).  We reviewed the resident's history and exam and pertinent patient test results.  I agree with the assessment, diagnosis, and plan of care documented in the resident's note. 

## 2013-02-25 NOTE — Progress Notes (Signed)
  Subjective:    Patient ID: Julie Williams, female    DOB: 22-Jul-1966, 47 y.o.   MRN: [id number]  HPI [name] is a 47 y.o. female here for evaluation of throat pain.  Since February she has experienced pain in her right tonsil when she swallows. Sometimes she notes "pus pockets" and mucus. She has seen multiple urgent care providers who have prescribed amoxicillin (3 courses since February) which leads to temporary relief. She has tried gargling and neti pots which have only slightly helped. There are no associated symptoms such as rhinorrhea, cough, swollen lymph nodes.  She was advised to see an ENT, but could not find one in [place] that would see her without insurance.  She smokes every day, 6 tobacco cigarettes + an e-cigarette.   Review of Systems  Constitutional: Negative for fever and chills.  HENT: Positive for sore throat and postnasal drip. Negative for congestion and rhinorrhea.   Eyes: Negative for pain and redness.  Respiratory: Negative for cough and shortness of breath.   Cardiovascular: Negative for chest pain.  Gastrointestinal: Negative for nausea, vomiting, diarrhea, constipation and blood in stool.  Genitourinary: Negative for dysuria and hematuria.  Skin: Negative for rash.  Neurological: Negative for dizziness and headaches.        Medication List       This list is accurate as of: 02/25/13  4:25 PM.  Always use your most recent med list.               albuterol 108 (90 BASE) MCG/ACT inhaler  Commonly known as:  PROVENTIL HFA;VENTOLIN HFA  Inhale 2 puffs into the lungs 4 (four) times daily.     amitriptyline 100 MG tablet  Commonly known as:  ELAVIL  Take 1 tablet (100 mg total) by mouth at bedtime.     BIOTENE/CALCIUM MT  Use as directed in the mouth or throat.     morphine 30 MG tablet  Commonly known as:  MSIR  Take 1/2 to 1 tab every 4-6 hours for pain as needed.     sennosides-docusate sodium 8.6-50 MG tablet  Commonly known as:   SENOKOT-S  Take 1 tablet by mouth 2 (two) times daily as needed for constipation.         Objective:   Physical Exam  HENT:  Head: Normocephalic and atraumatic.  Mouth/Throat: Uvula is midline, oropharynx is clear and moist and mucous membranes are normal. No oropharyngeal exudate, posterior oropharyngeal edema, posterior oropharyngeal erythema or tonsillar abscesses.  Right tonsil slightly larger than left. No erythema, exudate, hemorrhage.  Neck: Neck supple.  Cardiovascular: Normal rate, regular rhythm and normal heart sounds.   Pulmonary/Chest: Effort normal and breath sounds normal.  Lymphadenopathy:    She has no cervical adenopathy.          Assessment & Plan:

## 2013-02-25 NOTE — Assessment & Plan Note (Signed)
  Assessment: Progress toward smoking cessation:  smoking less Barriers to progress toward smoking cessation:  lack of motivation to quit  Plan: Instruction/counseling given:  I counseled patient on the dangers of tobacco use, advised patient to stop smoking, and reviewed strategies to maximize success. Educational resources provided:  other (see comments) ("Substitutes & Distractions") Self management tools provided:   ("Substitutes & Distractions" worksheet) Medications to assist with smoking cessation:  None Patient agreed to the following self-care plans for smoking cessation: cut down the number of cigarettes smoked  Other plans: Patient is not ready to fully quit cigarettes. She quit before for 1 year using Chantix, but relapsed due to weight gain. Not interested in fully stopping today. Will continue to reassess willingness at future visits.

## 2013-02-25 NOTE — Assessment & Plan Note (Addendum)
Given the chronicity of her symptoms and her benign throat exam, I doubt this represents a bacterial infection (though she may have had one earlier in the course). Antibiotics are not indicated. Other possibilities are oral cancer (she is a tobacco user) or lymphoma of the tonsil, though she had no lymphadenopathy and a normal CBC last visit. - Given her insurance status she will likely not be able to see an ENT in private practice here in [place]. Referred her to [facility] ENT teaching service for evaluation, and she is amenable. - Encouraged to reduce tobacco use

## 2013-03-04 DIAGNOSIS — H9190 Unspecified hearing loss, unspecified ear: Secondary | ICD-10-CM | POA: Insufficient documentation

## 2013-03-04 DIAGNOSIS — R0989 Other specified symptoms and signs involving the circulatory and respiratory systems: Secondary | ICD-10-CM

## 2013-03-04 HISTORY — DX: Unspecified hearing loss, unspecified ear: H91.90

## 2013-03-04 HISTORY — DX: Other specified symptoms and signs involving the circulatory and respiratory systems: R09.89

## 2013-04-08 DIAGNOSIS — R490 Dysphonia: Secondary | ICD-10-CM

## 2013-04-08 HISTORY — DX: Dysphonia: R49.0

## 2013-04-09 ENCOUNTER — Emergency Department (INDEPENDENT_AMBULATORY_CARE_PROVIDER_SITE_OTHER)
Admission: EM | Admit: 2013-04-09 | Discharge: 2013-04-09 | Disposition: A | Discharge status: Home / Self Care | Payer: Self-pay | Source: Home / Self Care

## 2013-04-09 ENCOUNTER — Encounter (HOSPITAL_COMMUNITY): Payer: Self-pay | Admitting: Emergency Medicine

## 2013-04-09 DIAGNOSIS — T6391XA Toxic effect of contact with unspecified venomous animal, accidental (unintentional), initial encounter: Secondary | ICD-10-CM

## 2013-04-09 DIAGNOSIS — L0390 Cellulitis, unspecified: Secondary | ICD-10-CM

## 2013-04-09 DIAGNOSIS — T63461A Toxic effect of venom of wasps, accidental (unintentional), initial encounter: Secondary | ICD-10-CM

## 2013-04-09 DIAGNOSIS — L0291 Cutaneous abscess, unspecified: Secondary | ICD-10-CM

## 2013-04-09 DIAGNOSIS — B3781 Candidal esophagitis: Secondary | ICD-10-CM

## 2013-04-09 MED ORDER — DOXYCYCLINE HYCLATE 100 MG PO CAPS: 100.0000 mg | ORAL_CAPSULE | Freq: Two times a day (BID) | ORAL | Status: DC

## 2013-04-09 MED ORDER — FLUCONAZOLE 100 MG PO TABS: ORAL_TABLET | ORAL | Status: DC

## 2013-04-09 MED ORDER — CEFTRIAXONE SODIUM 1 G IJ SOLR
INTRAMUSCULAR | Status: AC
  Filled 2013-04-09: qty 10

## 2013-04-09 MED ORDER — CEFTRIAXONE SODIUM 1 G IJ SOLR
1.0000 g | Freq: Once | INTRAMUSCULAR | Status: AC
  Administered 2013-04-09: 1 g via INTRAMUSCULAR

## 2013-04-09 MED ORDER — LIDOCAINE HCL (PF) 1 % IJ SOLN
INTRAMUSCULAR | Status: AC
  Filled 2013-04-09: qty 5

## 2013-04-09 NOTE — ED Provider Notes (Signed)
Julie Williams is a 47 y.o. female who presents to Urgent Care today bee sting. Patient was stung on her right upper arm by a yellow jacket last night. She notes significant erythema and pain that has worsened over the last several hours. She denies any fevers or chills and feels well otherwise. She has not tried any medications yet.   Additionally she notes that she was recently diagnosed with esophageal candidiasis by her ear nose and throat doctor. She has just started a one-week fluconazole course. She is worried that her symptoms may return after the antibiotics. She has not yet started feeling better.   PMH reviewed.  Hypertension History  Substance Use Topics  . Smoking status: Current Every Day Smoker    Types: Cigarettes  . Smokeless tobacco: Not on file     Comment: trying to quit-using electronic cigarette .  cutting back  . Alcohol Use: Yes     Comment: Occasional, 1-2 drinks per week   ROS as above Medications reviewed. Current Facility-Administered Medications  Medication Dose Route Frequency Provider Last Rate Last Dose  . cefTRIAXone (ROCEPHIN) injection 1 g  1 g Intramuscular Once Rodolph Bong, MD       Current Outpatient Prescriptions  Medication Sig Dispense Refill  . albuterol (PROVENTIL HFA;VENTOLIN HFA) 108 (90 BASE) MCG/ACT inhaler Inhale 2 puffs into the lungs 4 (four) times daily.  1 Inhaler  0  . amitriptyline (ELAVIL) 100 MG tablet Take 1 tablet (100 mg total) by mouth at bedtime.  30 tablet  2  . doxycycline (VIBRAMYCIN) 100 MG capsule Take 1 capsule (100 mg total) by mouth 2 (two) times daily.  20 capsule  0  . fluconazole (DIFLUCAN) 100 MG tablet 2 pills by mouth day one. One pill by mouth days 2-14  15 tablet  0  . morphine (MSIR) 30 MG tablet Take 1/2 to 1 tab every 4-6 hours for pain as needed.  120 tablet  0  . Mouthwashes (BIOTENE/CALCIUM MT) Use as directed in the mouth or throat.      . sennosides-docusate sodium (SENOKOT-S) 8.6-50 MG tablet Take 1  tablet by mouth 2 (two) times daily as needed for constipation.  60 tablet  1  . [DISCONTINUED] buPROPion (WELLBUTRIN SR) 150 MG 12 hr tablet Take 1 tablet (150 mg total) by mouth 2 (two) times daily.  60 tablet  2  . [DISCONTINUED] diphenhydrAMINE (BENADRYL) 50 MG tablet Take 50 mg by mouth at bedtime as needed.      . [DISCONTINUED] topiramate (TOPAMAX) 25 MG tablet Take 25 mg by mouth 2 (two) times daily.          Exam:  BP 132/93  Pulse 70  Temp(Src) 98.3 F (36.8 C) (Oral)  Resp 18  SpO2 100% Gen: Well NAD HEENT: EOMI,  MMM Lungs: CTABL Nl WOB Heart: RRR no MRG Abd: NABS, NT, ND Exts: Non edematous BL  LE, warm and well perfused.  Skin: Large erythematous patch on the right upper arm. Approximately 20 x 15 cm.  No papules.  Distal capillary refill and sensation are intact.   No results found for this or any previous visit (from the past 24 hour(s)). No results found.  Assessment and Plan: 47 y.o. female with  1) yellow jacket sting. Possibly now with cellulitis.  Plan to treat with ceftriaxone injection and doxycycline.  I have drawn borders around the erythema. Patient will present to the emergency room if her erythema is rapidly spreading.  Additionally she'll  take Benadryl at home.  2) esophageal candidiasis: Printed a refill for fluconazole. If following her doxycycline her symptoms return she will fill and start taking the fluconazole and followup with ear nose and throat.   Discussed warning signs or symptoms. Please see discharge instructions. Patient expresses understanding.      Rodolph Bong, MD 04/09/13 [id number]

## 2013-04-09 NOTE — ED Notes (Signed)
Pt c/o bee sting last night Sting to right triceps... sxs include: redness, itchiness, swelling... Radiating to elbow Denies: difficulty breathing... Alert w/no signs of acute distress.

## 2013-04-22 DIAGNOSIS — J3489 Other specified disorders of nose and nasal sinuses: Secondary | ICD-10-CM | POA: Insufficient documentation

## 2013-04-22 DIAGNOSIS — R0981 Nasal congestion: Secondary | ICD-10-CM | POA: Insufficient documentation

## 2013-05-07 ENCOUNTER — Ambulatory Visit (INDEPENDENT_AMBULATORY_CARE_PROVIDER_SITE_OTHER): Payer: Self-pay | Admitting: Internal Medicine

## 2013-05-07 ENCOUNTER — Ambulatory Visit: Payer: Self-pay

## 2013-05-07 ENCOUNTER — Encounter: Payer: Self-pay | Admitting: Internal Medicine

## 2013-05-07 VITALS — BP 114/77 | HR 69 | Temp 98.3°F | Ht 65.5 in | Wt 125.2 lb

## 2013-05-07 DIAGNOSIS — G894 Chronic pain syndrome: Secondary | ICD-10-CM

## 2013-05-07 DIAGNOSIS — I10 Essential (primary) hypertension: Secondary | ICD-10-CM

## 2013-05-07 DIAGNOSIS — Z23 Encounter for immunization: Secondary | ICD-10-CM

## 2013-05-07 DIAGNOSIS — F17200 Nicotine dependence, unspecified, uncomplicated: Secondary | ICD-10-CM

## 2013-05-07 DIAGNOSIS — F329 Major depressive disorder, single episode, unspecified: Secondary | ICD-10-CM

## 2013-05-07 MED ORDER — MORPHINE SULFATE 30 MG PO TABS: ORAL_TABLET | ORAL | Status: DC

## 2013-05-07 MED ORDER — BUPROPION HCL ER (SR) 150 MG PO TB12: ORAL_TABLET | ORAL | Status: DC

## 2013-05-07 NOTE — Progress Notes (Signed)
  Subjective:    Patient ID: Julie Williams, female    DOB: Mar 31, 1966, 47 y.o.   MRN: [id number]  HPI Comments: Julie Williams is a 47 year old woman with lifestyle controlled HTN, chronic pain and tobacco use disorder.  She presents for medication refill.  She has been prescribed 30mg  morphine 0.5-1 pill q4-6h daily.  She uses four 0.5 pills (60mg ) total daily on an average day but sometimes needs to use a full pill up to 6 times on a bad day.  She is unable to quantify how many good and bad days she has but says she probably uses a full pill half of the time.  She says the pain medication is helping her pain.    She is also interested in smoking cessation.  She has quit before (7 years ago) and was able to do so with the help of Chantix.  She quit for 1 year.  She would like to try Chantix again however she is uninsured and worried about cost.     Review of Systems  Respiratory: Negative for shortness of breath.   Gastrointestinal: Positive for constipation.       Objective:   Physical Exam  Constitutional: She is oriented to person, place, and time. She appears well-developed and well-nourished. No distress.  HENT:  Mouth/Throat: Oropharynx is clear and moist. No oropharyngeal exudate.  Eyes: Pupils are equal, round, and reactive to light. No scleral icterus.  Cardiovascular: Normal rate, regular rhythm and normal heart sounds.   No murmur heard. Pulmonary/Chest: Breath sounds normal. No respiratory distress. She has no wheezes. She has no rales.  Abdominal: Soft. Bowel sounds are normal. She exhibits no distension. There is no tenderness.  Neurological: She is alert and oriented to person, place, and time.  Skin: She is not diaphoretic.  Psychiatric: She has a normal mood and affect. Her behavior is normal.          Assessment & Plan:  Please see problem based assessment and plan.

## 2013-05-07 NOTE — Assessment & Plan Note (Signed)
Well controlled.  No need for medication at this time.

## 2013-05-07 NOTE — Assessment & Plan Note (Addendum)
Assessment: morphine is helping her pain and she appears to be using appropriately.  Plan:  Two rx provided for 120 tabs of morpine 30mg  (0.5-1 tab q4-6h prn); 2nd rx with written instruction to be filled 30 days after printed date.

## 2013-05-07 NOTE — Assessment & Plan Note (Signed)
Assessment:  Patient has smoked for 34 years and has been successful at quitting in the past.   She is interested in quitting again and would like to try Chantix.  Since she is uninsured she is unable to afford Chantix.  She has a history of depression and has tried Wellbutrin in the past which will be more affordable.  Plan:   1) Wellbutrin 150mg  daily x 3 days; then 150mg  twice daily.  2) Given 1-800-Quit Now information  3) Will follow-up at clinic visit in November

## 2013-05-07 NOTE — Patient Instructions (Addendum)
1. You have been prescribed Wellbutrin.  Please take this medication as prescribed.  Return to clinic in 2 months for routine health exam and follow-up.   2. Please take all medications as prescribed.    3. If you have worsening of your symptoms or new symptoms arise, please call the clinic ([phone]), or go to the ER immediately if symptoms are severe.   Bupropion sustained-release tablets (smoking cessation) What is this medicine? BUPROPION (byoo PROE pee on) is used to help people quit smoking. This medicine may be used for other purposes; ask your health care provider or pharmacist if you have questions. What should I tell my health care provider before I take this medicine? They need to know if you have any of these conditions: -an eating disorder, such as anorexia or bulimia -bipolar disorder or psychosis -diabetes or high blood sugar, treated with medication -head injury or brain tumor -heart disease, previous heart attack, or irregular heart beat -high blood pressure -kidney or liver disease -seizures -suicidal thoughts or a previous suicide attempt -Tourette's syndrome -weight loss -an unusual or allergic reaction to bupropion, other medicines, foods, dyes, or preservatives -breast-feeding -pregnant or trying to become pregnant How should I use this medicine? Take this medicine by mouth with a glass of water. Follow the directions on the prescription label. You can take it with or without food. If it upsets your stomach, take it with food. Do not cut, crush or chew this medicine. Take your medicine at regular intervals. If you take this medicine more than once a day, take your second dose at least 8 hours after you take your first dose. To limit difficulty in sleeping, avoid taking this medicine at bedtime. Do not take your medicine more often than directed. If you have been taking this medicine for some time, do not suddenly stop taking it. If your doctor wants you to stop the  medicine, the dose will be slowly lowered over time to avoid any side effects. A special MedGuide will be given to you by the pharmacist with each prescription and refill. Be sure to read this information carefully each time. Talk to your pediatrician regarding the use of this medicine in children. Special care may be needed. Overdosage: If you think you have taken too much of this medicine contact a poison control center or emergency room at once. NOTE: This medicine is only for you. Do not share this medicine with others. What if I miss a dose? If you miss a dose, skip the missed dose and take your next tablet at the regular time. There should be at least 8 hours between doses. Do not take double or extra doses. What may interact with this medicine? Do not take this medicine with any of the following medications: -linezolid -medicines called MAO Inhibitors like Nardil, Parnate, Marplan, Eldepryl -methylene blue -other medicines that contain bupropion like Zyban -procarbazine This medicine may also interact with the following medications: -amantadine -carbamazepine -cimetidine -corticosteroids -cyclophosphamide -efavirenz -levodopa or combination drugs containing levodopa -medicines or herbal products for weight control or appetite -medicines for mental depression, emotional, or psychotic disturbances -nelfinavir -nicotine -orphenadrine -phenobarbital -phenytoin -ritonavir -some medicines for heart rhythm or blood pressure -theophylline -thiotepa -tramadol -warfarin This list may not describe all possible interactions. Give your health care provider a list of all the medicines, herbs, non-prescription drugs, or dietary supplements you use. Also tell them if you smoke, drink alcohol, or use illegal drugs. Some items may interact with your medicine. What should  I watch for while using this medicine? Visit your doctor or health care professional for regular checks on your progress.  You may have to take this medicine for several days before you start to feel better. Patients and their families should watch out for depression or thoughts of suicide that get worse. Also watch out for sudden or severe changes in feelings such as feeling anxious, agitated, panicky, irritable, hostile, aggressive, impulsive, severely restless, overly excited and hyperactive, or not being able to sleep. If this happens, especially at the beginning of treatment or after a change in dose, call your doctor. Alcohol may increase dizziness or drowsiness. Avoid alcoholic drinks while taking this medicine. Drinking excessive alcoholic beverages, using sleeping or anxiety medicines, or quickly stopping the use of these agents while taking this medicine may increase your risk for a seizure. You may get dizzy or have blurred vision. Do not drive, use machinery, or do anything that needs mental alertness until you know how this medicine affects you. Do not stand or sit up quickly, especially if you are an older patient. This reduces the risk of dizzy or fainting spells. Your mouth may get dry. Chewing sugarless gum or sucking hard candy, and drinking plenty of water may help. Contact your doctor if the problem does not go away or is severe. Do not treat yourself for coughs, colds, or allergies without asking your doctor or health care professional. Also do not take any herbal or non-prescription medicines for weight loss without the advice of your doctor or health care professional. Some ingredients may increase possible side effects. Do not use nicotine patches or chewing gum without the advice of your doctor or health care professional while taking this medicine. You may need to have your blood pressure taken regularly if your doctor recommends that you use both nicotine and this medicine together. If you take a urine drug screening test while receiving this medicine, it may make the test result positive for  amphetamines. Be sure to tell the person giving you the drug screening test that you are taking this medicine. What side effects may I notice from receiving this medicine? Side effects that you should report to your doctor or health care professional as soon as possible: -allergic reactions like skin rash, itching or hives, swelling of the face, lips, or tongue -breathing problems -changes in vision -confusion -fast or irregular heartbeat -hallucinations -increased blood pressure -redness, blistering, peeling or loosening of the skin, including inside the mouth -seizures -suicidal thoughts or other mood changes -unusually weak or tired -vomiting Side effects that usually do not require medical attention (report to your doctor or health care professional if they continue or are bothersome): -change in sex drive or performance -constipation -headache -loss of appetite -nausea -tremors -weight loss This list may not describe all possible side effects. Call your doctor for medical advice about side effects. You may report side effects to FDA at 1-800-FDA-1088. Where should I keep my medicine? Keep out of the reach of children. Store at room temperature between 20 and 25 degrees C (68 and 77 degrees F). Protect from light. Keep container tightly closed. Throw away any unused medicine after the expiration date. NOTE: This sheet is a summary. It may not cover all possible information. If you have questions about this medicine, talk to your doctor, pharmacist, or health care provider.  2012, [employer]vier/[employer] Standard. (04/02/2010 8:21:25 PM)

## 2013-05-07 NOTE — Addendum Note (Signed)
Addended by: Maura Crandall on: 05/07/2013 04:13 PM   Modules accepted: Orders

## 2013-05-07 NOTE — Assessment & Plan Note (Signed)
Patient discontinued her amitriptyline a few months ago.  She declines referral to social work for behavioral health referral.  Will start Wellbutrin for tobacco cessation and depression.

## 2013-05-09 NOTE — Progress Notes (Signed)
I saw and evaluated the patient.  I personally confirmed the key portions of the history and exam documented by Dr. Wilson and I reviewed pertinent patient test results.  The assessment, diagnosis, and plan were formulated together and I agree with the documentation in the resident's note. 

## 2013-07-07 ENCOUNTER — Encounter: Payer: Self-pay | Admitting: Internal Medicine

## 2013-07-07 ENCOUNTER — Ambulatory Visit (INDEPENDENT_AMBULATORY_CARE_PROVIDER_SITE_OTHER): Payer: Self-pay | Admitting: Internal Medicine

## 2013-07-07 ENCOUNTER — Other Ambulatory Visit: Payer: Self-pay | Admitting: Internal Medicine

## 2013-07-07 VITALS — BP 104/79 | HR 88 | Temp 99.4°F | Ht 65.5 in | Wt 126.6 lb

## 2013-07-07 DIAGNOSIS — J392 Other diseases of pharynx: Secondary | ICD-10-CM

## 2013-07-07 DIAGNOSIS — R0989 Other specified symptoms and signs involving the circulatory and respiratory systems: Secondary | ICD-10-CM

## 2013-07-07 DIAGNOSIS — F17200 Nicotine dependence, unspecified, uncomplicated: Secondary | ICD-10-CM

## 2013-07-07 DIAGNOSIS — F329 Major depressive disorder, single episode, unspecified: Secondary | ICD-10-CM

## 2013-07-07 DIAGNOSIS — G894 Chronic pain syndrome: Secondary | ICD-10-CM

## 2013-07-07 MED ORDER — MORPHINE SULFATE 30 MG PO TABS: ORAL_TABLET | ORAL | Status: DC

## 2013-07-07 MED ORDER — VARENICLINE TARTRATE 0.5 MG X 11 & 1 MG X 42 PO MISC: ORAL | Status: DC

## 2013-07-07 MED ORDER — BUPROPION HCL ER (SR) 200 MG PO TB12: 200.0000 mg | ORAL_TABLET | Freq: Two times a day (BID) | ORAL | Status: DC
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
## 2013-07-07 NOTE — Patient Instructions (Signed)
Thank you for your visit. - I have increased the dose of your Wellbutrin to 200 mg twice a day. The prescription has been sent to your pharmacy electronically. - I provided you with a paper prescription for Chantix for smoking cessation. Please talk your pharmacist at [facility] about any discount programs for this drug.  - You may also try to call the quit line again, though I hear and understand your concerns about them collecting too much personal information. - I provided you with 3 months worth of your morphine prescription. - Please return to see me in 3 months.

## 2013-07-07 NOTE — Assessment & Plan Note (Signed)
Her pain is well controlled with morphine, and there are no indications that she is using it inappropriately. - Three month's worth of prescriptions provided for morphine 30 mg #120

## 2013-07-07 NOTE — Assessment & Plan Note (Signed)
Patient is still contemplating surgery. She will followup with [facility] as needed.

## 2013-07-07 NOTE — Progress Notes (Signed)
I saw and evaluated the patient.  I personally confirmed the key portions of Dr. Cater's history and exam and reviewed pertinent patient test results.  The assessment, diagnosis, and plan were formulated together and I agree with the documentation in the resident's note. 

## 2013-07-07 NOTE — Assessment & Plan Note (Signed)
Patient reports improvement in mood with Wellbutrin 150 mg twice a day. She requests a higher dose to see if this will help with her smoking cessation as well. I don't see a problem with this as she reports no history of seizures.  - Increased Wellbutrin ER to 200 mg twice a day

## 2013-07-07 NOTE — Assessment & Plan Note (Signed)
Patient continues to smoke several cigarettes per day as well as use an e-cigarette. She has had success with quitting using Chantix in the past and would like to try this again, but she is uninsured and unable to afford it at this time. Last visit she was told to call 1-800-quit-now for information on any discounts on Chantix. She tried twice, but the system forced her to give out too much personal information prior to speaking with a representative, so she hung up. - Provided the patient with a prescription for Chantix, with the plan being to show this to her pharmacist and ask about any discounts programs they may know about. She always has the option to not fill it if it's too expensive.

## 2013-07-12 ENCOUNTER — Telehealth: Payer: Self-pay | Admitting: *Deleted

## 2013-07-12 NOTE — Telephone Encounter (Signed)
Call from pt - states Wellbutrin 200mg  BID is too expensive; the price has tripled. Wants to know go back to 150mg  which she states she can afford. Thanks

## 2013-07-14 ENCOUNTER — Other Ambulatory Visit: Payer: Self-pay | Admitting: Internal Medicine

## 2013-07-14 DIAGNOSIS — F17200 Nicotine dependence, unspecified, uncomplicated: Secondary | ICD-10-CM

## 2013-07-14 DIAGNOSIS — F329 Major depressive disorder, single episode, unspecified: Secondary | ICD-10-CM

## 2013-07-14 MED ORDER — BUPROPION HCL ER (SR) 150 MG PO TB12: 150.0000 mg | ORAL_TABLET | Freq: Two times a day (BID) | ORAL | Status: DC

## 2013-07-14 NOTE — Telephone Encounter (Signed)
Called pt - left message; rx for Wellbutrin 150mg  has been sent to the pharmacy.

## 2013-07-14 NOTE — Telephone Encounter (Signed)
Thanks, that's too bad, we did talk about the possibility that the new dose would be more expensive. I will re-order her old prescription Wellbutrin 150mg .  Vivi Barrack, MD  [email or web address] Pager # [phone] Office # [phone]

## 2013-09-07 ENCOUNTER — Telehealth: Payer: Self-pay | Admitting: *Deleted

## 2013-09-07 NOTE — Telephone Encounter (Signed)
Pt called has gotten assist on meds through [employer] out of [place] - discuss assist on morphine. Suggest to talk with [employer]  And [facility] in [facility]. Hilda Blades Jazzlyn Huizenga RN 09/07/13 12:45PM

## 2013-09-23 ENCOUNTER — Telehealth: Payer: Self-pay | Admitting: *Deleted

## 2013-09-23 NOTE — Telephone Encounter (Signed)
Pt needs a letter asking to have a hearing test at [facility] for hearing aid placement for both ears, needs to be on [place] letterhead addressed to Kirby Funk, audiologist This will need to be faxed ASAP to [phone]

## 2013-09-24 ENCOUNTER — Encounter: Payer: Self-pay | Admitting: Internal Medicine

## 2013-09-24 NOTE — Telephone Encounter (Signed)
  Reason for call:   I placed an outgoing call to [name] at 11:12  AM regarding her need for a hearing test.   Assessment/ Plan:   Julie Williams tells me she is scheduled to see an audiologist next Thursday. She would like me to send their office a letter on [place] requesting a hearing test for both ears. The audiologist told her she needs this in order to perform the test. She has hearing loss and a hearing aid in her left ear already, but needs reassessment of this ear and also has noted some hearing loss on the right so would like that ear to be evaluated as well.  I wrote a letter to Rance Muir, Au.D., at [facility], requesting a hearing test for both ears for Julie Williams and faxed it to the number she provided: [phone].  As always, pt is advised that if symptoms worsen or new symptoms arise, they should go to an urgent care facility or to to ER for further evaluation.   Lesly Dukes, MD   09/24/2013, 11:12 AM

## 2013-09-29 ENCOUNTER — Ambulatory Visit (INDEPENDENT_AMBULATORY_CARE_PROVIDER_SITE_OTHER): Payer: Self-pay | Admitting: Internal Medicine

## 2013-09-29 ENCOUNTER — Encounter: Payer: Self-pay | Admitting: Internal Medicine

## 2013-09-29 VITALS — BP 123/86 | HR 70 | Temp 97.0°F | Ht 65.5 in | Wt 134.5 lb

## 2013-09-29 DIAGNOSIS — F329 Major depressive disorder, single episode, unspecified: Secondary | ICD-10-CM

## 2013-09-29 DIAGNOSIS — F17200 Nicotine dependence, unspecified, uncomplicated: Secondary | ICD-10-CM

## 2013-09-29 DIAGNOSIS — R0989 Other specified symptoms and signs involving the circulatory and respiratory systems: Secondary | ICD-10-CM

## 2013-09-29 DIAGNOSIS — F3289 Other specified depressive episodes: Secondary | ICD-10-CM

## 2013-09-29 DIAGNOSIS — G894 Chronic pain syndrome: Secondary | ICD-10-CM

## 2013-09-29 DIAGNOSIS — J392 Other diseases of pharynx: Secondary | ICD-10-CM

## 2013-09-29 DIAGNOSIS — Z0000 Encounter for general adult medical examination without abnormal findings: Secondary | ICD-10-CM

## 2013-09-29 DIAGNOSIS — Z01419 Encounter for gynecological examination (general) (routine) without abnormal findings: Secondary | ICD-10-CM | POA: Insufficient documentation

## 2013-09-29 DIAGNOSIS — H9190 Unspecified hearing loss, unspecified ear: Secondary | ICD-10-CM

## 2013-09-29 DIAGNOSIS — I10 Essential (primary) hypertension: Secondary | ICD-10-CM

## 2013-09-29 MED ORDER — MORPHINE SULFATE 30 MG PO TABS: ORAL_TABLET | ORAL | Status: DC

## 2013-09-29 NOTE — Assessment & Plan Note (Signed)
Well controlled on Wellbutrin 150 mg twice daily. Denies mood issues.

## 2013-09-29 NOTE — Assessment & Plan Note (Addendum)
Patient would benefit from a hearing test. Letter requesting bilateral hearing test has been faxed to [facility]. She will follow up with her audiologist [facility] tomorrow, 09/30/13.  - I have provided her with a copy of the letter
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
## 2013-09-29 NOTE — Assessment & Plan Note (Signed)
Still present. She is still contemplating surgery and will followup with [facility] as needed. I am hopeful her symptoms will improve now that she has quit cigarettes.

## 2013-09-29 NOTE — Assessment & Plan Note (Signed)
Patient quit smoking on 08/26/2013 with the help of Chantix, which she was able to get through [facility]. - Patient congratulated on this big step and encouraged to keep up the good work - I told her to reach out to the office if she needs any help from [place] or any refills for her Chantix

## 2013-09-29 NOTE — Patient Instructions (Signed)
Thanks for your visit. - I have given you 3 month's worth of your morphine, to be refilled in March, April, May. - As you know, if you lose these prescriptions we cannot re place them. - Please return in 3 months for a refill. - I really congratulate you on quitting smoking and encourage you to keep up the good work. - We have placed a referral for you to get your yearly screening mammogram. - I provided you with the letter I sent to your audiologist just in case there is an issue tomorrow. - Please see me again in 3 months.

## 2013-09-29 NOTE — Assessment & Plan Note (Addendum)
Pain remains well controlled on 30mg  morphine 0.5-1 pill q4-6h daily. - Prescriptions provided with fill dates of: 10/24/13, 11/24/13, 12/24/13 - Patient reminded that if she loses these prescriptions, they cannot be replaced - She will return for refills in 3 months

## 2013-09-29 NOTE — Assessment & Plan Note (Signed)
Well controlled on no maintenance medications.

## 2013-09-29 NOTE — Assessment & Plan Note (Addendum)
Referral placed for screening mammogram. Apparently she needs to apply for a scholarship to cover this, as she has no insurance. Glenda did provide her with the phone number and information about this assistance program. We'll check in with her about it next visit. Her last mammogram in 2012 was BI-RADS 1.

## 2013-10-01 NOTE — Progress Notes (Signed)
Case discussed with Dr. Cater at time of visit.  We reviewed the resident's history and exam and pertinent patient test results.  I agree with the assessment, diagnosis, and plan of care documented in the resident's note. 

## 2013-10-13 ENCOUNTER — Encounter: Payer: Self-pay | Admitting: Internal Medicine

## 2013-11-02 ENCOUNTER — Ambulatory Visit: Payer: Self-pay

## 2013-11-23 ENCOUNTER — Ambulatory Visit (HOSPITAL_COMMUNITY): Payer: Self-pay

## 2013-12-23 ENCOUNTER — Other Ambulatory Visit: Payer: Self-pay | Admitting: Internal Medicine

## 2013-12-23 ENCOUNTER — Ambulatory Visit (HOSPITAL_COMMUNITY)
Admission: RE | Admit: 2013-12-23 | Discharge: 2013-12-23 | Disposition: A | Discharge status: Home / Self Care | Payer: Self-pay | Source: Ambulatory Visit | Attending: Internal Medicine | Admitting: Internal Medicine

## 2013-12-23 DIAGNOSIS — Z0000 Encounter for general adult medical examination without abnormal findings: Secondary | ICD-10-CM

## 2013-12-23 DIAGNOSIS — Z1231 Encounter for screening mammogram for malignant neoplasm of breast: Secondary | ICD-10-CM

## 2014-01-05 ENCOUNTER — Ambulatory Visit (HOSPITAL_COMMUNITY)
Admission: RE | Admit: 2014-01-05 | Discharge: 2014-01-05 | Disposition: A | Discharge status: Home / Self Care | Payer: Self-pay | Source: Ambulatory Visit | Attending: Oncology | Admitting: Oncology

## 2014-01-05 ENCOUNTER — Encounter: Payer: Self-pay | Admitting: Internal Medicine

## 2014-01-05 ENCOUNTER — Ambulatory Visit (INDEPENDENT_AMBULATORY_CARE_PROVIDER_SITE_OTHER): Payer: Self-pay | Admitting: Internal Medicine

## 2014-01-05 VITALS — BP 127/79 | HR 68 | Temp 97.0°F | Ht 65.5 in | Wt 138.0 lb

## 2014-01-05 DIAGNOSIS — F3289 Other specified depressive episodes: Secondary | ICD-10-CM

## 2014-01-05 DIAGNOSIS — J392 Other diseases of pharynx: Secondary | ICD-10-CM

## 2014-01-05 DIAGNOSIS — M25562 Pain in left knee: Secondary | ICD-10-CM

## 2014-01-05 DIAGNOSIS — M25569 Pain in unspecified knee: Secondary | ICD-10-CM

## 2014-01-05 DIAGNOSIS — G894 Chronic pain syndrome: Secondary | ICD-10-CM

## 2014-01-05 DIAGNOSIS — Z86010 Personal history of colonic polyps: Secondary | ICD-10-CM

## 2014-01-05 DIAGNOSIS — Z0000 Encounter for general adult medical examination without abnormal findings: Secondary | ICD-10-CM

## 2014-01-05 DIAGNOSIS — F329 Major depressive disorder, single episode, unspecified: Secondary | ICD-10-CM

## 2014-01-05 DIAGNOSIS — R0989 Other specified symptoms and signs involving the circulatory and respiratory systems: Secondary | ICD-10-CM

## 2014-01-05 DIAGNOSIS — I10 Essential (primary) hypertension: Secondary | ICD-10-CM

## 2014-01-05 DIAGNOSIS — F17200 Nicotine dependence, unspecified, uncomplicated: Secondary | ICD-10-CM

## 2014-01-05 MED ORDER — MORPHINE SULFATE 30 MG PO TABS: ORAL_TABLET | ORAL | Status: DC

## 2014-01-05 NOTE — Assessment & Plan Note (Addendum)
Exam is notable for crepitus. There is no bony tenderness or effusion. No pain on range of motion exam. I suspect osteoarthritis. She has seen Dr. Nori Riis with sports medicine in the past for shoulder pain, but at that time she had a yellow card. Currently she has no insurance and is not eligible for an orange card as she does not living [facility]  - Obtain plain film of the left knee  - I will ask Julie Williams, Education officer, museum, about options for referral to sports medicine  - Recommended continuing judicious NSAIDs, heat pads, massage   ADDENDUM: Plain film is normal. No joint space narrowing. Per Julie Williams she has 100% [place] discount, so if pain continues we can can proceed with the referral for Sports Medicine next visit.

## 2014-01-05 NOTE — Assessment & Plan Note (Signed)
BP Readings from Last 3 Encounters:  01/05/14 127/79  09/29/13 123/86  07/07/13 104/79    Lab Results  Component Value Date   NA 142 02/08/2013   K 5.3 02/08/2013   CREATININE 0.87 02/08/2013    Assessment: Blood pressure control: controlled Progress toward BP goal:  at goal  Plan: Medications:  Diet controlled

## 2014-01-05 NOTE — Patient Instructions (Signed)
Thank you for your visit. - I will talk to our social worker, Julie Williams, about [facility] and have her give you a call. - I have refilled your morphine for the next 3 months.  - Continue massage, heat pads, and Aleve as needed for your knee pain. If it continues, we can offer an x-ray to determine if there is significant arthritis, and try to refer you back to Sports medicine, though this may be a challenge due to your insurance status. I will also ask Julie Williams about this to see if there is anything we can do in terms of referral. - Continue the excellent work with your smoking cessation. I know it's frustrating to have gained weight, but it is a great thing for your health to have quit smoking and I really commend you. - I will get your colonoscopy report from [facility]. - Please return in 3 months.

## 2014-01-05 NOTE — Assessment & Plan Note (Signed)
She tells me her depression is well controlled on Wellbutrin 150mg  twice daily.

## 2014-01-05 NOTE — Assessment & Plan Note (Addendum)
Mammogram 12/24/13 was BI-RADS 1. I have consulted Shana about the [facility] issue.  ADDENDUM: Per Edwena Blow Wellbutrin SR is now available in generic form so Medication Assistance Programs are not available to assist.
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
## 2014-01-05 NOTE — Assessment & Plan Note (Addendum)
Patient has maintained smoking cessation. However she is quite distressed by her weight gain. Weight gain has been a barrier to her maintaining smoking cessation in the past.  - I encouraged her to keep it up as it is the best thing for her health. I told her to reach out to the office if she needs any help from [place].

## 2014-01-05 NOTE — Assessment & Plan Note (Signed)
Still with tonsil pain. Exam is benign. Denies fevers or chills. She is still contemplating surgery at [facility].

## 2014-01-05 NOTE — Assessment & Plan Note (Signed)
Pain remains well controlled on 30mg  morphine 0.5-1 pill q4-6h daily.  - Prescriptions provided with fill dates of: 01/24/14, 02/23/14, 03/26/14  - Patient reminded that if she loses these prescriptions, they cannot be replaced  - She will return for refills in 3 months

## 2014-01-05 NOTE — Assessment & Plan Note (Addendum)
Last colonoscopy was 02/03/2012 at [facility]. A single polyp was found in the sigmoid colon. The polyp measured 8 mm diameter. A polypectomy was performed. Pathology showed tubular adenoma. Recommended return in 5 years for repeat colonoscopy.  - I have updated her health maintenance to postpone the study until 01/2017

## 2014-01-06 ENCOUNTER — Telehealth: Payer: Self-pay | Admitting: Licensed Clinical Social Worker

## 2014-01-06 NOTE — Progress Notes (Signed)
Case discussed with Dr. Lucila Maine soon after the resident saw the patient.  We reviewed the resident's history and exam and pertinent patient test results.  I agree with the assessment, diagnosis, and plan of care documented in the resident's note.

## 2014-01-06 NOTE — Telephone Encounter (Signed)
Julie Williams is currently uninsured and has the [place] discount at 100%.  Pt is enrolled with [facility] and inquired with PCP regarding Wellbutrin SR not being covered through [place] currently has a generic available thus there is no manufacturer program available.  CSW placed called to pt.  CSW left message requesting return call. CSW provided contact hours and phone number.    CSW informed PCP pt's has [place] discount 100% and is able to be referred to a [place] provider accepting the discount program.

## 2014-01-07 ENCOUNTER — Telehealth: Payer: Self-pay | Admitting: *Deleted

## 2014-01-07 NOTE — Telephone Encounter (Signed)
Returned pt's call - pt requesting result of left knee x-ray result which was "Normal radiographs of the left knee". Result given to pt.

## 2014-01-07 NOTE — Telephone Encounter (Signed)
Ms. Lamy returned call to [place].  Discussed as to why some medication are not on the [place] formulary.  CSW explained [facility] [place] [facility] works along with manufacturers for discounts and availability.  Ms. Romano requesting if Wellbutrin can be on [facility] formulary.  At this time, Wellbutrin SR manufacturer does not offer a prescription assistance program.  Pt requesting anti-depressant that is on [place] [facility] formulary.  CSW will send to pt's PCP to review.  In addition, pt inquiring if there are any prescription assistance programs for pain medications.  Currently, cost of generic morphine is approx $50/month to fill pt's prescription.  Pt aware CSW will forward this information to PCP for review of appropriate changes.

## 2014-01-14 ENCOUNTER — Telehealth: Payer: Self-pay | Admitting: Licensed Clinical Social Worker

## 2014-01-14 ENCOUNTER — Other Ambulatory Visit: Payer: Self-pay | Admitting: Internal Medicine

## 2014-01-14 DIAGNOSIS — F32A Depression, unspecified: Secondary | ICD-10-CM

## 2014-01-14 DIAGNOSIS — F329 Major depressive disorder, single episode, unspecified: Secondary | ICD-10-CM

## 2014-01-14 MED ORDER — FLUOXETINE HCL 20 MG PO CAPS: 20.0000 mg | ORAL_CAPSULE | Freq: Every day | ORAL | Status: DC

## 2014-01-14 NOTE — Progress Notes (Signed)
  Reason for call:   I placed an outgoing call to [name] at 3:00  PM regarding changing her depression medication to a different drug on the [facility] formulary.   Assessment/ Plan:  Julie Williams is a 48yo F with depression. She has been on Wellbutrin for several years, and this has kept her depression under decent control. It was prescribed also for smoking cessation benefits.  She quit smoking on 08/26/13 with the help of Chantix and remains tobacco free. Unfortunately, the Wellbutrin costs her about $50/month which is becoming financially untenable.  She gets her Chantix through the [facility] program and wanted to know if we could prescribe her a depression medicine from their formulary.  Edwena Blow provided me with the formulary. Wellbutrin is not on it. Prozac and Cymbalta are. She tried Prozac a long time ago and remembers tolerating it. She wants to save the money but is hesitant to switch to a new depression medication if it means her mood issues with un surface. I counseled her that Wellbutrin and Prozac do work differently, and switching may cause mood issues, but we can work together to monitor her for this and up titrate the Prozac if needed. We usually start with a low dose, 20mg , and work up from there. She is amenable to starting Prozac 20mg  daily and following up in the clinic several weeks after starting it to determine if the dose needs to be increased. She will call to make the appointment. She plans to finish her supply of Wellbutrin this month before switching. Patient was counseled on the side effects including GI upset and decreased libido. Prescription has been sent electronically to [facility].  As always, pt is advised that if symptoms worsen or new symptoms arise, they should go to an urgent care facility or to to ER for further evaluation.   Lesly Dukes, MD   01/14/2014, 3:01 PM

## 2014-01-14 NOTE — Telephone Encounter (Signed)
CSW received call from Ms. Daum regarding response for medications changes to medications that have PAP or on the [facility] formulary.  CSW informed Ms. Nordahl, will forward to PCP and triage nurse for response.  PCP has [facility] formulary in [place].  CSW will sign off.

## 2014-04-28 ENCOUNTER — Encounter: Payer: Self-pay | Admitting: Internal Medicine

## 2014-04-28 ENCOUNTER — Ambulatory Visit (INDEPENDENT_AMBULATORY_CARE_PROVIDER_SITE_OTHER): Payer: Self-pay | Admitting: Internal Medicine

## 2014-04-28 VITALS — BP 137/91 | HR 62 | Temp 97.7°F | Ht 65.0 in | Wt 139.4 lb

## 2014-04-28 DIAGNOSIS — M25511 Pain in right shoulder: Secondary | ICD-10-CM | POA: Insufficient documentation

## 2014-04-28 DIAGNOSIS — G894 Chronic pain syndrome: Secondary | ICD-10-CM

## 2014-04-28 DIAGNOSIS — F329 Major depressive disorder, single episode, unspecified: Secondary | ICD-10-CM

## 2014-04-28 DIAGNOSIS — Z23 Encounter for immunization: Secondary | ICD-10-CM

## 2014-04-28 DIAGNOSIS — M75101 Unspecified rotator cuff tear or rupture of right shoulder, not specified as traumatic: Secondary | ICD-10-CM

## 2014-04-28 DIAGNOSIS — M719 Bursopathy, unspecified: Secondary | ICD-10-CM

## 2014-04-28 DIAGNOSIS — F3289 Other specified depressive episodes: Secondary | ICD-10-CM

## 2014-04-28 DIAGNOSIS — M67919 Unspecified disorder of synovium and tendon, unspecified shoulder: Secondary | ICD-10-CM

## 2014-04-28 DIAGNOSIS — K625 Hemorrhage of anus and rectum: Secondary | ICD-10-CM | POA: Insufficient documentation

## 2014-04-28 DIAGNOSIS — I10 Essential (primary) hypertension: Secondary | ICD-10-CM

## 2014-04-28 DIAGNOSIS — Z0000 Encounter for general adult medical examination without abnormal findings: Secondary | ICD-10-CM

## 2014-04-28 LAB — CBC WITH DIFFERENTIAL/PLATELET
BASOS ABS: 0 10*3/uL (ref 0.0–0.1)
Basophils Relative: 0 % (ref 0–1)
Eosinophils Absolute: 0.1 10*3/uL (ref 0.0–0.7)
Eosinophils Relative: 1 % (ref 0–5)
HEMATOCRIT: 40 % (ref 36.0–46.0)
HEMOGLOBIN: 13.3 g/dL (ref 12.0–15.0)
LYMPHS PCT: 41 % (ref 12–46)
Lymphs Abs: 2.1 10*3/uL (ref 0.7–4.0)
MCH: 31.7 pg (ref 26.0–34.0)
MCHC: 33.3 g/dL (ref 30.0–36.0)
MCV: 95.2 fL (ref 78.0–100.0)
MONO ABS: 0.5 10*3/uL (ref 0.1–1.0)
MONOS PCT: 9 % (ref 3–12)
Neutro Abs: 2.5 10*3/uL (ref 1.7–7.7)
Neutrophils Relative %: 49 % (ref 43–77)
Platelets: 242 10*3/uL (ref 150–400)
RBC: 4.2 MIL/uL (ref 3.87–5.11)
RDW: 14 % (ref 11.5–15.5)
WBC: 5.1 10*3/uL (ref 4.0–10.5)

## 2014-04-28 LAB — BASIC METABOLIC PANEL WITH GFR
BUN: 17 mg/dL (ref 6–23)
CO2: 28 mEq/L (ref 19–32)
CREATININE: 0.72 mg/dL (ref 0.50–1.10)
Calcium: 9.8 mg/dL (ref 8.4–10.5)
Chloride: 105 mEq/L (ref 96–112)
GFR, Est African American: >89 mL/min
Glucose, Bld: 100 mg/dL — ABNORMAL HIGH (ref 70–99)
Potassium: 5.3 mEq/L (ref 3.5–5.3)
Sodium: 140 mEq/L (ref 135–145)

## 2014-04-28 LAB — LIPID PANEL
Cholesterol: 197 mg/dL (ref 0–200)
HDL: 76 mg/dL (ref >39)
LDL CALC: 101 mg/dL — AB (ref 0–99)
Total CHOL/HDL Ratio: 2.6 Ratio
Triglycerides: 102 mg/dL (ref <150)
VLDL: 20 mg/dL (ref 0–40)

## 2014-04-28 MED ORDER — FLUOXETINE HCL 40 MG PO CAPS
40.0000 mg | ORAL_CAPSULE | Freq: Every day | ORAL | Status: DC
Start: 2014-04-28 — End: 2014-05-23

## 2014-04-28 MED ORDER — MORPHINE SULFATE 30 MG PO TABS: ORAL_TABLET | ORAL | Status: DC

## 2014-04-28 NOTE — Assessment & Plan Note (Signed)
Under good control with lifestyle control.  Filed Vitals:   04/28/14 0957  BP: 137/91  Pulse: 62  Temp: 97.7 F (36.5 C)

## 2014-04-28 NOTE — Patient Instructions (Signed)
Please call the office in 2 weeks and ask for Dr. Genene Churn. We need to you tell us whether the prozac is helping you.   We filled your pain meds for 3 months. You should give all the prescriptions to the pharmacist. We will not give you new one if you lose any or it gets stolen.  You should follow up with the gastroenterologist about your rectal bleeding.  We will check your labs today.

## 2014-04-28 NOTE — Assessment & Plan Note (Signed)
Has hx of polyps, last colonoscopy and polypectomy on 2013. Has painless rectal bleeding.She states she did self-exam and didn't see anything. Refusing rectal exam. Seen GI doctor recently and they offered stool softener and patient has tried preparation H without any relief. Explained to patient that we cannot do anything unless we can examine her. Only thing we offered was preparation H supp. since it could be internal hemorrhoid.   Patient reminded that this could be malignancy given her family hx of colon cancer (grand father). Asked to f/up with GI.

## 2014-04-28 NOTE — Assessment & Plan Note (Addendum)
Will give her flu shot today 04/28/2014.  Will also check lipid panel, BMP, CBC.

## 2014-04-28 NOTE — Assessment & Plan Note (Addendum)
Did lateral right shoulder 26ml steroid + 1 ml xylocaine injection 04/28/2014 with Dr. Eppie Gibson. Patient asked to come back every 3 months for repeat injections for this if it helps her pain.
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
## 2014-04-28 NOTE — Assessment & Plan Note (Signed)
Can't afford wellbutrin. Has been on prozac 20mg  for last 3 months without any effect.  Go up on prozac to 40mg  daily. Asked to call in 2 weeks to report how she is doing with it. May go up to max 80mg /daily on this or switch to another SSRI in the future if it doesn't help.

## 2014-04-28 NOTE — Assessment & Plan Note (Signed)
Has chronic pain on back, legs, both arms. Pain hasn't changed.   Three Prescriptions for 30mg  morphine 0.5-1 pill q4-6 hr daily provided for 3 months. Database lookup showed compliance with the pain contract.   Asked her to return in 3 months for refill.

## 2014-04-29 NOTE — Progress Notes (Signed)
I saw and evaluated the patient. I personally confirmed the key portions of Dr. Brandt Loosen history and exam and reviewed pertinent patient test results. The assessment, diagnosis, and plan were formulated together and I agree with the documentation in the resident's note. I was present in the room for the entire right shoulder injection.  Likelihood of colonic cancer as a cause of the rectal bleeding is very low given the colonoscopy evaluation she had just 2 years ago.  Without the ability to examin her (she is too embarrassed) we are limited in diagnosis and thus appropriate treatment.  We explained this to her, yet she still insists upon maintaining her modesty.  We offered preparation H suppository for a presumed internal hemorrhoid (most likely cause), but she was not interested secondary to it being ineffective previously.  With regard to the shoulder pain, she should present for PRN injections of the right shoulder no more frequently than Q3 months only if she received significant relief from the injection and she is symptomatic.  There is no indication for Q3 month shoulder injections if there is no pain or no relief from this injection.

## 2014-05-04 ENCOUNTER — Ambulatory Visit: Payer: Self-pay

## 2014-05-23 ENCOUNTER — Telehealth: Payer: Self-pay | Admitting: *Deleted

## 2014-05-23 DIAGNOSIS — F3289 Other specified depressive episodes: Secondary | ICD-10-CM

## 2014-05-23 DIAGNOSIS — F329 Major depressive disorder, single episode, unspecified: Secondary | ICD-10-CM

## 2014-05-23 MED ORDER — FLUOXETINE HCL 40 MG PO CAPS: 80.0000 mg | ORAL_CAPSULE | Freq: Every day | ORAL | Status: DC

## 2014-05-23 NOTE — Telephone Encounter (Signed)
Pt called - no change with depression since changing Prozac to 40mg . Message left for Dr Genene Churn to call pt. Hilda Blades Jhoselyn Ruffini RN 05/23/14 3PM

## 2014-05-23 NOTE — Telephone Encounter (Signed)
Patient stated her prozac 40mg  is not helping. She has only been on it for 4 weeks. I asked her to try 80mg  daily and report back in 2-4 weeks.

## 2014-06-27 ENCOUNTER — Encounter: Payer: Self-pay | Admitting: Internal Medicine

## 2014-07-28 ENCOUNTER — Ambulatory Visit (INDEPENDENT_AMBULATORY_CARE_PROVIDER_SITE_OTHER): Payer: Self-pay | Admitting: Internal Medicine

## 2014-07-28 ENCOUNTER — Encounter: Payer: Self-pay | Admitting: Internal Medicine

## 2014-07-28 ENCOUNTER — Encounter: Payer: Self-pay | Admitting: Licensed Clinical Social Worker

## 2014-07-28 VITALS — BP 152/78 | HR 56 | Temp 97.9°F | Ht 65.5 in | Wt 142.0 lb

## 2014-07-28 DIAGNOSIS — F329 Major depressive disorder, single episode, unspecified: Secondary | ICD-10-CM

## 2014-07-28 DIAGNOSIS — F32A Depression, unspecified: Secondary | ICD-10-CM

## 2014-07-28 DIAGNOSIS — G894 Chronic pain syndrome: Secondary | ICD-10-CM

## 2014-07-28 MED ORDER — MORPHINE SULFATE 30 MG PO TABS: ORAL_TABLET | ORAL | Status: DC

## 2014-07-28 MED ORDER — SERTRALINE HCL 50 MG PO TABS
50.0000 mg | ORAL_TABLET | Freq: Every day | ORAL | Status: DC
Start: 2014-07-28 — End: 2014-10-31

## 2014-07-28 NOTE — Progress Notes (Signed)
CSW met briefly with Ms. Brossman.  Pt is uninsured and utilizes the [employer] program for medications.  Pt requesting a different anti-depressant medication on the [facility] formulary other than what is currently prescribed for her.  Pt states she utilized Wellbutrin in the past with success, however this medications is not on formulary.  Offered information on [facility] for $20/180 day supply.  Pt states she does not have the funds.  See physician notes for additional information.  CSW encouraged Ms. Osoria to establish with psychiatry, as [facility] formulary changes without notice.  CSW attempted to provide Ms. Lacap with information to [place] and [place].  Pt declined.  Information available when pt is ready to accept.  December [facility] formulary provided to physician.

## 2014-07-28 NOTE — Assessment & Plan Note (Signed)
No change in pain. Refilled MSIR 30mg  1/2-1 tablet q4-6hrs PRN, disp #120, 0 refills. 3 months prescriptions were provided.
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
## 2014-07-28 NOTE — Assessment & Plan Note (Addendum)
Fluoxetine is not working per pt. She is at the max dose of 80mg  daily. She used to take Wellbutrin with good results but this is not covered in her medication plan. Ms. Plotner is very depressed about her chronic pain and inability to do the work that she wants 2/2 pain. She does desire die and has suicidal ideation without specific plans, but states that her animals keep her going and promises that she is not going to harm herself. She really wants something like Wellbutrin but cannot afford the $20 for 180 tablets at this time. Will try another SSRI that is covered by her insurance, and hopefully this will work better for her. She really needs to see a Psychiatrist; this was strongly encouraged but the patient strongly declined. CSW saw the patient in the clinic and offered information about community behavioral health programs but the patient declined. - Starting Sertraline 50mg  daily, which can be uptitrated q week to max of 200mg .  - Pt notified to take with food for maximal absorption  - Needs Psychiatry referral, pt declined

## 2014-07-28 NOTE — Patient Instructions (Signed)
Take sertraline 50mg  daily. This can be increased, but call the doctor before increasing your dose.  Return to the clinic in 1 month  General Instructions:   Please bring your medicines with you each time you come to clinic.  Medicines may include prescription medications, over-the-counter medications, herbal remedies, eye drops, vitamins, or other pills.   Progress Toward Treatment Goals:  Treatment Goal 01/05/2014  Blood pressure at goal  Stop smoking -    Self Care Goals & Plans:  Self Care Goal 07/28/2014  Manage my medications take my medicines as prescribed; bring my medications to every visit; refill my medications on time; follow the sick day instructions if I am sick  Monitor my health keep track of my blood pressure; keep track of my weight  Eat healthy foods eat more vegetables; eat fruit for snacks and desserts; eat baked foods instead of fried foods; eat foods that are low in salt; eat smaller portions  Be physically active find an activity I enjoy  Stop smoking -  Other -    No flowsheet data found.   Care Management & Community Referrals:  No flowsheet data found.

## 2014-07-28 NOTE — Progress Notes (Signed)
Internal Medicine Clinic Attending  Case discussed with Dr. Glenn soon after the resident saw the patient.  We reviewed the resident's history and exam and pertinent patient test results.  I agree with the assessment, diagnosis, and plan of care documented in the resident's note. 

## 2014-08-26 HISTORY — PX: ROTATOR CUFF REPAIR: SHX139

## 2014-10-24 ENCOUNTER — Telehealth: Payer: Self-pay | Admitting: Internal Medicine

## 2014-10-24 NOTE — Telephone Encounter (Signed)
Call to patient to confirm appointment for 10/25/14 at 9:00 lmtcb

## 2014-10-25 ENCOUNTER — Ambulatory Visit: Payer: Self-pay

## 2014-10-31 ENCOUNTER — Other Ambulatory Visit: Payer: Self-pay | Admitting: *Deleted

## 2014-10-31 DIAGNOSIS — F32A Depression, unspecified: Secondary | ICD-10-CM

## 2014-10-31 DIAGNOSIS — F329 Major depressive disorder, single episode, unspecified: Secondary | ICD-10-CM

## 2014-11-03 ENCOUNTER — Telehealth: Payer: Self-pay | Admitting: Internal Medicine

## 2014-11-03 ENCOUNTER — Encounter: Payer: Self-pay | Admitting: *Deleted

## 2014-11-03 MED ORDER — SERTRALINE HCL 50 MG PO TABS: 50.0000 mg | ORAL_TABLET | Freq: Every day | ORAL | Status: DC

## 2014-11-03 NOTE — Telephone Encounter (Signed)
Call to patient to confirm appointment for 11/07/14 at 3:45 lmtcb

## 2014-11-07 ENCOUNTER — Encounter: Payer: Self-pay | Admitting: Internal Medicine

## 2014-11-07 ENCOUNTER — Ambulatory Visit (INDEPENDENT_AMBULATORY_CARE_PROVIDER_SITE_OTHER): Payer: Self-pay | Admitting: Internal Medicine

## 2014-11-07 VITALS — BP 129/84 | HR 60 | Temp 98.1°F | Wt 145.3 lb

## 2014-11-07 DIAGNOSIS — F17200 Nicotine dependence, unspecified, uncomplicated: Secondary | ICD-10-CM

## 2014-11-07 DIAGNOSIS — Z87891 Personal history of nicotine dependence: Secondary | ICD-10-CM

## 2014-11-07 DIAGNOSIS — K219 Gastro-esophageal reflux disease without esophagitis: Secondary | ICD-10-CM

## 2014-11-07 DIAGNOSIS — F329 Major depressive disorder, single episode, unspecified: Secondary | ICD-10-CM

## 2014-11-07 DIAGNOSIS — M546 Pain in thoracic spine: Secondary | ICD-10-CM

## 2014-11-07 DIAGNOSIS — M75101 Unspecified rotator cuff tear or rupture of right shoulder, not specified as traumatic: Secondary | ICD-10-CM

## 2014-11-07 DIAGNOSIS — F32A Depression, unspecified: Secondary | ICD-10-CM

## 2014-11-07 DIAGNOSIS — J3489 Other specified disorders of nose and nasal sinuses: Secondary | ICD-10-CM

## 2014-11-07 DIAGNOSIS — Z0000 Encounter for general adult medical examination without abnormal findings: Secondary | ICD-10-CM

## 2014-11-07 DIAGNOSIS — G894 Chronic pain syndrome: Secondary | ICD-10-CM

## 2014-11-07 DIAGNOSIS — M542 Cervicalgia: Secondary | ICD-10-CM

## 2014-11-07 DIAGNOSIS — K625 Hemorrhage of anus and rectum: Secondary | ICD-10-CM

## 2014-11-07 MED ORDER — MORPHINE SULFATE 30 MG PO TABS: ORAL_TABLET | ORAL | Status: DC

## 2014-11-07 MED ORDER — SODIUM CHLORIDE 0.65 % NA SOLN: 1.0000 | NASAL | Status: DC | PRN

## 2014-11-07 MED ORDER — FLUTICASONE PROPIONATE 50 MCG/ACT NA SUSP: 1.0000 | Freq: Every day | NASAL | Status: DC

## 2014-11-07 MED ORDER — SERTRALINE HCL 100 MG PO TABS: 100.0000 mg | ORAL_TABLET | Freq: Every day | ORAL | Status: DC

## 2014-11-07 MED ORDER — PANTOPRAZOLE SODIUM 40 MG PO TBEC: 40.0000 mg | DELAYED_RELEASE_TABLET | Freq: Every day | ORAL | Status: DC

## 2014-11-07 NOTE — Progress Notes (Signed)
   Subjective:    Patient ID: Julie Williams, female    DOB: July 14, 1966, 49 y.o.   MRN: [id number]  HPI  49 yo female with chronic back pain, right shoulder pain, depression here for follow up.  Continues to have right shoulder pain and back pain. Depression is better since starting sertaline 50 mg daily. Wants to go up on it. Wants PAP done.   Saw GI recently at [place] for rectal bleeding. Started annusol for hemorrhoid. Offered sigmoidoscopy if not improving. Bleeding has not improved much yet but has follow up with them in 4 months.   Has quit smoking.  See problem based a&p.   Review of Systems  Constitutional: Negative for fever and chills.  Respiratory: Negative for cough, chest tightness and shortness of breath.   Cardiovascular: Negative for chest pain and leg swelling.  Gastrointestinal: Positive for anal bleeding. Negative for nausea and vomiting.  Genitourinary: Negative for dysuria, flank pain and vaginal discharge.       Has some trouble emptying bladder which she thinks is from her rectal bleeding.  Musculoskeletal: Positive for back pain, arthralgias and neck pain.  Neurological: Negative for dizziness and weakness.  Psychiatric/Behavioral:       Mood is better now but still depressed.       Objective:   Physical Exam  Constitutional: She is oriented to person, place, and time. She appears well-developed and well-nourished. No distress.  HENT:  Head: Normocephalic and atraumatic.  Left Ear: External ear normal.  Eyes: Conjunctivae and EOM are normal. Pupils are equal, round, and reactive to light.  Neck: Normal range of motion. No JVD present.  Cardiovascular: Normal rate and regular rhythm.  Exam reveals friction rub. Exam reveals no gallop.   No murmur heard. Pulmonary/Chest: Effort normal and breath sounds normal. No respiratory distress. She has no wheezes. She has no rales.  Abdominal: Soft. Bowel sounds are normal. She exhibits no mass. There is no  rebound.  Musculoskeletal: She exhibits tenderness. She exhibits no edema.  Has TTP on right shoulder. ROM is limited. Cannot do abduction past 90 degrees. Has pain on passive ROM as well on right shoulder.   Strength 5/5 on both hands and legs.  No TTP on spinous processes.  Neurological: She is alert and oriented to person, place, and time. No cranial nerve deficit.  Skin: She is not diaphoretic.       Assessment & Plan:  See problem based a&p.  Gave her info about Pap clinic on April.

## 2014-11-07 NOTE — Assessment & Plan Note (Signed)
Patient had MRI finding partial rotator cuff tear on 2012 at [facility]. Per patient, she has no insurance so everyone refused to see her for that. She states she saw ortho in [place] but they told her they cannot operate because of her insurance status. [facility] was seen by [facility] and was referred to PT. She said PT didn't help at all and she does not want to try PT. She also does not want to go back to sports medicine for it.   She lives outside of [place] so she will not be able to get the orange card either.   We sent another referral to [facility] since she has [facility] care at [facility]. She states she has barely any income to try to go see ortho here with any payment plan option.   She states steroid injection on Septmber 2015 that I did helped somewhat but she is afraid of it and will consider doing it next visit.  Refilled MS IR 30mg  #120 tabs for 3 months  for her today. Could not give [place] urine sample today. Will get urine sample next visit before refilling it.

## 2014-11-07 NOTE — Assessment & Plan Note (Signed)
Continues to have mid back/cervical back pain with tingling of both hands. No change in pain.   Refilled MS IR 30mg  #120 tabs for 3 months  for her today. Could not give [place] urine sample today. Will get urine sample next visit before refilling it.

## 2014-11-07 NOTE — Assessment & Plan Note (Signed)
Depression is finally better with sertraline. Is on 50mg  daily now. Will go up to 100mg  daily.

## 2014-11-07 NOTE — Assessment & Plan Note (Signed)
Has quit smoking.  

## 2014-11-07 NOTE — Patient Instructions (Signed)
Please follow up in 3 months Come to our free Pap clinic on April 18th, 2016 6-7 PM. Call [phone] to register for this before coming.  Take your other meds as prescribed.  Please see ortho at [facility] for shoulder pain.

## 2014-11-07 NOTE — Assessment & Plan Note (Addendum)
Saw GI recently at [place] for rectal bleeding. Started annusol for hemorrhoid. Offered sigmoidoscopy if not improving. Bleeding has not improved much yet but has follow up with them in 4 months.  Offered to check CBC today but patient refused.  Had cscope on 2013 with polypectomy. Supposed to have another one in 5 years. Patient worried about it even though I explained that's the guideline. I asked her to bring this up again when she has f/up in 4 months with GI.

## 2014-11-08 NOTE — Addendum Note (Signed)
Addended by: Dellia Nims on: 11/08/2014 01:55 PM   Modules accepted: Level of Service

## 2014-11-09 NOTE — Addendum Note (Signed)
Addended by: Dellia Nims on: 11/09/2014 01:02 PM   Modules accepted: Level of Service

## 2014-11-10 NOTE — Progress Notes (Signed)
Internal Medicine Clinic Attending Date of Visit: 11/07/2014  Case discussed with Dr. Genene Churn at the time of the visit.  We reviewed the resident's history and exam and pertinent patient test results.  I agree with the assessment, diagnosis, and plan of care documented in the resident's note.

## 2014-11-23 ENCOUNTER — Telehealth: Payer: Self-pay | Admitting: *Deleted

## 2014-11-23 NOTE — Telephone Encounter (Signed)
i have called pt and left a vmail for her to call and ask for me

## 2014-11-23 NOTE — Telephone Encounter (Signed)
Returned pt 's call yesterday afternoon - pt stated she has been having trouble refilling MC Contin rx at [facility]. Stated the last time [facility] had trouble w/the doctor's "number". And she was also told the clinic does not always have the correct address on the rxs . Pt sounded upset about this problem and wanted to be sure she would have a problem w/ the next refill.  I told her I would call [facility].  I did call [facility] on [address] and talked to the pharmacist yesterday; who was very nice. She stated they did have a problem w/Dr Glenn's DEA number when they ran the rx thru; but the problem was resolved and pt received her medication. And sometimes they do not know whether rxs are from the ED or doctor's office. And there's no guaranteed she will not have a problem w/her next refill.  All of this was explained to the pt.   I talked to [place], in Triage this morning; who will f/u.

## 2014-11-24 NOTE — Telephone Encounter (Signed)
Pt rtc and i explained that i could not guarantee that there would be no problems when she filled her scripts that i could only promise to resolve problems quickly when i can and that if the pharmacy calls for a problem we will try not to hang up until problem is resolved. She is agreeable

## 2014-12-06 ENCOUNTER — Other Ambulatory Visit: Payer: Self-pay | Admitting: Internal Medicine

## 2014-12-06 DIAGNOSIS — Z1231 Encounter for screening mammogram for malignant neoplasm of breast: Secondary | ICD-10-CM

## 2014-12-07 ENCOUNTER — Other Ambulatory Visit: Payer: Self-pay | Admitting: *Deleted

## 2014-12-07 DIAGNOSIS — F329 Major depressive disorder, single episode, unspecified: Secondary | ICD-10-CM

## 2014-12-07 DIAGNOSIS — F32A Depression, unspecified: Secondary | ICD-10-CM

## 2014-12-07 DIAGNOSIS — K219 Gastro-esophageal reflux disease without esophagitis: Secondary | ICD-10-CM

## 2014-12-07 MED ORDER — PANTOPRAZOLE SODIUM 40 MG PO TBEC: 40.0000 mg | DELAYED_RELEASE_TABLET | Freq: Every day | ORAL | Status: DC

## 2014-12-07 MED ORDER — SERTRALINE HCL 100 MG PO TABS: 100.0000 mg | ORAL_TABLET | Freq: Every day | ORAL | Status: DC

## 2014-12-26 ENCOUNTER — Ambulatory Visit (HOSPITAL_COMMUNITY)
Admission: RE | Admit: 2014-12-26 | Discharge: 2014-12-26 | Disposition: A | Discharge status: Home / Self Care | Payer: Self-pay | Source: Ambulatory Visit | Attending: Internal Medicine | Admitting: Internal Medicine

## 2014-12-26 DIAGNOSIS — Z1231 Encounter for screening mammogram for malignant neoplasm of breast: Secondary | ICD-10-CM | POA: Insufficient documentation

## 2015-01-02 ENCOUNTER — Other Ambulatory Visit: Payer: Self-pay | Admitting: *Deleted

## 2015-01-02 DIAGNOSIS — F32A Depression, unspecified: Secondary | ICD-10-CM

## 2015-01-02 DIAGNOSIS — F329 Major depressive disorder, single episode, unspecified: Secondary | ICD-10-CM

## 2015-01-03 MED ORDER — SERTRALINE HCL 100 MG PO TABS: 100.0000 mg | ORAL_TABLET | Freq: Every day | ORAL | Status: DC

## 2015-01-06 DIAGNOSIS — M75120 Complete rotator cuff tear or rupture of unspecified shoulder, not specified as traumatic: Secondary | ICD-10-CM | POA: Insufficient documentation

## 2015-01-06 DIAGNOSIS — Z981 Arthrodesis status: Secondary | ICD-10-CM

## 2015-01-06 DIAGNOSIS — M75121 Complete rotator cuff tear or rupture of right shoulder, not specified as traumatic: Secondary | ICD-10-CM

## 2015-01-06 HISTORY — DX: Complete rotator cuff tear or rupture of right shoulder, not specified as traumatic: M75.121

## 2015-01-06 HISTORY — DX: Arthrodesis status: Z98.1

## 2015-02-02 ENCOUNTER — Encounter: Payer: Self-pay | Admitting: Internal Medicine

## 2015-02-02 ENCOUNTER — Telehealth: Payer: Self-pay | Admitting: *Deleted

## 2015-02-02 NOTE — Telephone Encounter (Signed)
Pt has called this am and spoken to deborah h. And debbi d. R/t ortho surgery and pain management. Triage has now spoken to loriin dr graves office at [facility], she will be faxing the last visit note and states that dr graves will be doing rotator cuff surgery on tentatively 03/07/2015. Dr graves states in his note he will not be handling post op pain management, note is in Care Everywhere. Dr Berenice Primas will need a letter from dr Genene Churn very soon stating he will be willing to do postop pain management. This is because pt's charity status expires 03/09/2015. Could we possibly have this ready to fax to dr graves Monday 6/13? Dr Berenice Primas, pam rn [phone] Will place a [place] narc database in dr [employer]

## 2015-02-02 NOTE — Telephone Encounter (Signed)
Every month

## 2015-02-02 NOTE — Telephone Encounter (Signed)
I have sent the letter to Dr. Berenice Primas via fax on Epic. If you feel the need, you can also print it from the letter section and fax it to them.  Please notify the patient that she will need to follow up at the clinic very closely in order to get pain medication.  Thanks, Dr. Genene Churn.

## 2015-02-02 NOTE — Telephone Encounter (Signed)
How often do you want her to follow, she will want specific information

## 2015-02-02 NOTE — Telephone Encounter (Signed)
[name] from [facility] Dr Berenice Primas [phone] - left a message they need a letter from PCP that they will  do pain management for surgery in July  2016 for rotator cuff. Called pt - she states surgery is sch 03/07/15 - pending letter about pain med. Pt does not want to come in to clinic and talk with Dr Genene Churn. Pt states she has sign pain contract in past. Pt states current med for pain does not need to be changed concerning surgery. Pt states she call 3 weeks ago and talked to staff person about this problem. Pt is very upset with clinic. Talked with S. Powers and H.Buena Irish about this problem. Theresia Bough states she will take care of problem.  [name] RN 02/02/15 11:45AM

## 2015-02-05 ENCOUNTER — Encounter: Payer: Self-pay | Admitting: *Deleted

## 2015-02-07 ENCOUNTER — Other Ambulatory Visit: Payer: Self-pay | Admitting: *Deleted

## 2015-02-07 ENCOUNTER — Other Ambulatory Visit: Payer: Self-pay | Admitting: Internal Medicine

## 2015-02-07 DIAGNOSIS — F329 Major depressive disorder, single episode, unspecified: Secondary | ICD-10-CM

## 2015-02-07 DIAGNOSIS — G894 Chronic pain syndrome: Secondary | ICD-10-CM

## 2015-02-07 DIAGNOSIS — F32A Depression, unspecified: Secondary | ICD-10-CM

## 2015-02-07 MED ORDER — MORPHINE SULFATE 30 MG PO TABS: ORAL_TABLET | ORAL | Status: DC

## 2015-02-07 MED ORDER — SERTRALINE HCL 100 MG PO TABS: 100.0000 mg | ORAL_TABLET | Freq: Every day | ORAL | Status: DC

## 2015-02-07 NOTE — Progress Notes (Signed)
I have been put in a difficult position of managing this patient's post op pain without knowing which pain regimen she would require. Her ortho physician Dr. Berenice Primas at [place] as refused to give her ANY pain script after her surgery.  It's hard to predict what she would require after the surgery. But patient will have no other choice if we don't giver her script before she goes into the OR for pain medicine after she is discharged.  After discussing with Dr. Lynnae January, we have decided with the following plan:  Fill her regular morphine IR PO 30mg  tab 1/2 tab q4-6hr PRN #120 for 02/07/15  Give her 1 month supply of more morrphine IR 30mg  1 tab q4hr prn #200 for 03/07/15 (day of her surgery) to last 1 month. She can fill this on 03/05/15.  3rd script for her regular morphine IR PO 30mg  tab 1/2 tab q4-6hr prn #120 from 04/09/15.   If she needs more pain regimen in between, she MUST come to the clinic. It's ok to see other provider if I am not in clinic that time but I prefer that she sees me.

## 2015-02-09 ENCOUNTER — Telehealth: Payer: Self-pay | Admitting: *Deleted

## 2015-02-09 NOTE — Telephone Encounter (Signed)
Pt states that dr sarah Martinique at [facility] last year prescribed nexium for acid reflux, this was noted when pt saw dr Martinique for tonsil stones. i ask if she had been taking protonix and nexium, she said no, she has never gotten protonix, states she has been taking nexium 20 mg, has 2 pills left. Please advise, if willing to do nexium, send script to [place] [facility]. thanks

## 2015-02-10 MED ORDER — ESOMEPRAZOLE MAGNESIUM 20 MG PO CPDR: 20.0000 mg | DELAYED_RELEASE_CAPSULE | Freq: Every day | ORAL | Status: DC

## 2015-02-21 ENCOUNTER — Telehealth: Payer: Self-pay | Admitting: *Deleted

## 2015-02-21 NOTE — Telephone Encounter (Signed)
Call from patient stating she was just stung by 4 - 5 bees, on her breast.  She states she is allergic to Bees and has reacted to them in past.  She usually needs an injection. She states she has whelps on chest.  Denies any SOB Our clinic is 100 % full and we are not able to see her.  I advise UCC or ED for immediate evaluation.   She states she does not have insurance and will not to go ED and sit for 6 hours.  This has happened in the past.  Again I advised ED or another [facility] as ours does not open until 1:00, she states she will wait in the parking lot until 1:00

## 2015-02-21 NOTE — Telephone Encounter (Signed)
Agree would prefer patient to be seen in the ED, but should be seen today for her allergic reaction.  If she develops SOB or difficulty breathing, she should present to the ED.

## 2015-02-23 ENCOUNTER — Other Ambulatory Visit: Payer: Self-pay | Admitting: Internal Medicine

## 2015-02-23 NOTE — Telephone Encounter (Signed)
Ph[facility].

## 2015-02-24 NOTE — Telephone Encounter (Signed)
[facility] called about nexium refill.  They are closed today but I left message for them to send out to pt.

## 2015-02-28 ENCOUNTER — Other Ambulatory Visit: Payer: Self-pay | Admitting: *Deleted

## 2015-02-28 NOTE — Telephone Encounter (Signed)
[facility] called and they do not stock Nexium.  They do have omeprazole.  I will send MD request to change.  Pt also needs to re certify. Pt informed.

## 2015-02-28 NOTE — Telephone Encounter (Signed)
[facility] does not carry Nexium but they do have omeprazole.  Will you consider d/c the nexium and start omeprazole?

## 2015-03-01 MED ORDER — OMEPRAZOLE 40 MG PO CPDR: 40.0000 mg | DELAYED_RELEASE_CAPSULE | Freq: Every day | ORAL | Status: DC

## 2015-03-08 ENCOUNTER — Telehealth: Payer: Self-pay | Admitting: *Deleted

## 2015-03-08 NOTE — Telephone Encounter (Signed)
I called pt to inform her of message from attending and she is very unhappy and states she is in a lot of pain. Dr. Berenice Primas has said Dr Genene Churn would be in charge of pain management after surgery.   I suggested for her to call the Surgeon and she said no. I told her I would send a message to Dr Genene Churn to call her tonight.  Pt # [phone]

## 2015-03-08 NOTE — Telephone Encounter (Signed)
Pt called stating she had shoulder surgery yesterday and today the block has warn off and she is in extreme pain. Dr. Genene Churn gave her a Rx for MSIR 30 mg # 200 to cover pain after surgery.  Pt states this is not touching her pain and she is also using IBU 400 mg along with Tylenol 500 mg.  Please advise, Dr. Genene Churn is on night float   Note about this in [facility] 6/14 by Dr Genene Churn.  We have several open slots today. Pt # [phone]

## 2015-03-08 NOTE — Telephone Encounter (Signed)
Called patient. She stated she had her surgery yesterday and has been taking tylenol + ibuprofen + morphine 30. She has been taking the morphing 30 every 4 hours today. She stated she is not getting any relief from the pain.   I explained that I had no way of knowing how much pain med she would require after the surgery as I did not see her post-op. I told her to try taking oxy IR 30 2 tabs and see if it helps her. I asked her to only do this first few days out of surgery and then to take less. I told her if the pain is not controlled then she would come to the clinic and be seen (does not have to be me as I am on NF). I also explained the danger of over sedation with the medications. I asked her if her pain is not controlled with the morphine then she would need to come to ED for evaluation if she cannot be seen at the clinic.  She understood and stated she has friend who will watch over her.

## 2015-03-08 NOTE — Telephone Encounter (Signed)
Patient had a shoulder arthroscopy in May according to [place] at [facility] by Dr Berenice Primas. However the patient mentions that she had the surgery yesterday. I am not sure about this. Also, if she did have the surgery yesterday, I am unsure what should I give her over and above the increased dose of MSIR that she is already taking as prescribed by Dr Genene Churn. I think it is best that the patient calls her surgeon and ask him for advice. I am cautious of  prescribing her more pain medications without the surgeon's advice. I have checked her in the narcotics database and she appears to be refilling prescribed meds from [facility] only.  Madilyn Fireman MD MPH 03/08/2015 5:04 PM

## 2015-03-09 NOTE — Telephone Encounter (Signed)
Thank you Dr Genene Churn.

## 2015-03-20 ENCOUNTER — Encounter: Payer: Self-pay | Admitting: Internal Medicine

## 2015-03-27 ENCOUNTER — Emergency Department (HOSPITAL_COMMUNITY): Payer: Self-pay

## 2015-03-27 ENCOUNTER — Encounter (HOSPITAL_COMMUNITY): Payer: Self-pay | Admitting: *Deleted

## 2015-03-27 ENCOUNTER — Emergency Department (INDEPENDENT_AMBULATORY_CARE_PROVIDER_SITE_OTHER): Payer: Self-pay

## 2015-03-27 ENCOUNTER — Emergency Department (INDEPENDENT_AMBULATORY_CARE_PROVIDER_SITE_OTHER)
Admission: EM | Admit: 2015-03-27 | Discharge: 2015-03-27 | Disposition: A | Discharge status: Home / Self Care | Payer: Self-pay | Source: Home / Self Care | Attending: Family Medicine | Admitting: Family Medicine

## 2015-03-27 DIAGNOSIS — S92912A Unspecified fracture of left toe(s), initial encounter for closed fracture: Secondary | ICD-10-CM

## 2015-03-27 LAB — URIC ACID: URIC ACID, SERUM: 5.7 mg/dL (ref 2.3–6.6)
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
## 2015-03-27 NOTE — Discharge Instructions (Signed)
Use ice, advil and shoe as needed until pain resolves, activity as tolerated/

## 2015-03-27 NOTE — ED Notes (Addendum)
Pt  Reports  Symptoms  Of  An  Injured  L 4  Th  Toe           Swelling  Present     To  The  Affected  Foot     With  Bruising  Noted     Injury   Occurred last  Pm

## 2015-03-28 ENCOUNTER — Ambulatory Visit: Payer: No Typology Code available for payment source | Attending: Family Medicine | Admitting: Physical Therapy

## 2015-03-28 DIAGNOSIS — M75101 Unspecified rotator cuff tear or rupture of right shoulder, not specified as traumatic: Secondary | ICD-10-CM

## 2015-03-28 DIAGNOSIS — M6281 Muscle weakness (generalized): Secondary | ICD-10-CM | POA: Insufficient documentation

## 2015-03-28 DIAGNOSIS — M25611 Stiffness of right shoulder, not elsewhere classified: Secondary | ICD-10-CM

## 2015-03-28 DIAGNOSIS — M25511 Pain in right shoulder: Secondary | ICD-10-CM
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
## 2015-03-31 ENCOUNTER — Ambulatory Visit: Payer: No Typology Code available for payment source | Admitting: Rehabilitative and Restorative Service Providers"

## 2015-03-31 DIAGNOSIS — M75101 Unspecified rotator cuff tear or rupture of right shoulder, not specified as traumatic: Secondary | ICD-10-CM

## 2015-03-31 DIAGNOSIS — M25511 Pain in right shoulder: Secondary | ICD-10-CM

## 2015-03-31 DIAGNOSIS — M25611 Stiffness of right shoulder, not elsewhere classified: Secondary | ICD-10-CM
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
## 2015-03-31 NOTE — Patient Instructions (Signed)
Advised pt to stop sleeping in recliner with sling on and on R shoulder and to notify MD if shoulder pain/clicking increased. Advised her she may be sore from PT today but to perform HEP and use ice as needed.

## 2015-04-03 ENCOUNTER — Encounter: Payer: Self-pay | Admitting: Internal Medicine

## 2015-04-03 ENCOUNTER — Ambulatory Visit: Payer: No Typology Code available for payment source | Admitting: Physical Therapy

## 2015-04-03 DIAGNOSIS — M25611 Stiffness of right shoulder, not elsewhere classified: Secondary | ICD-10-CM

## 2015-04-03 DIAGNOSIS — M75101 Unspecified rotator cuff tear or rupture of right shoulder, not specified as traumatic: Secondary | ICD-10-CM

## 2015-04-03 DIAGNOSIS — M25511 Pain in right shoulder: Secondary | ICD-10-CM

## 2015-04-03 DIAGNOSIS — M6281 Muscle weakness (generalized): Secondary | ICD-10-CM
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
## 2015-04-11 ENCOUNTER — Ambulatory Visit: Payer: No Typology Code available for payment source

## 2015-04-11 ENCOUNTER — Ambulatory Visit: Payer: Self-pay | Admitting: Physical Therapy

## 2015-04-11 DIAGNOSIS — M25511 Pain in right shoulder: Secondary | ICD-10-CM

## 2015-04-11 DIAGNOSIS — M25611 Stiffness of right shoulder, not elsewhere classified: Secondary | ICD-10-CM

## 2015-04-11 DIAGNOSIS — M75101 Unspecified rotator cuff tear or rupture of right shoulder, not specified as traumatic: Secondary | ICD-10-CM

## 2015-04-11 DIAGNOSIS — M6281 Muscle weakness (generalized): Secondary | ICD-10-CM
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
## 2015-04-13 DIAGNOSIS — Z9889 Other specified postprocedural states: Secondary | ICD-10-CM

## 2015-04-13 HISTORY — DX: Other specified postprocedural states: Z98.890

## 2015-04-17 ENCOUNTER — Encounter: Payer: Self-pay | Admitting: Internal Medicine

## 2015-04-17 ENCOUNTER — Ambulatory Visit (INDEPENDENT_AMBULATORY_CARE_PROVIDER_SITE_OTHER): Payer: Self-pay | Admitting: Internal Medicine

## 2015-04-17 ENCOUNTER — Ambulatory Visit (HOSPITAL_COMMUNITY)
Admission: RE | Admit: 2015-04-17 | Discharge: 2015-04-17 | Disposition: A | Discharge status: Home / Self Care | Payer: Self-pay | Source: Ambulatory Visit | Attending: Internal Medicine | Admitting: Internal Medicine

## 2015-04-17 DIAGNOSIS — R0789 Other chest pain: Secondary | ICD-10-CM

## 2015-04-17 DIAGNOSIS — M75101 Unspecified rotator cuff tear or rupture of right shoulder, not specified as traumatic: Secondary | ICD-10-CM

## 2015-04-17 DIAGNOSIS — R079 Chest pain, unspecified: Secondary | ICD-10-CM | POA: Insufficient documentation

## 2015-04-17 DIAGNOSIS — G894 Chronic pain syndrome: Secondary | ICD-10-CM

## 2015-04-17 DIAGNOSIS — I200 Unstable angina: Secondary | ICD-10-CM | POA: Insufficient documentation

## 2015-04-17 DIAGNOSIS — R001 Bradycardia, unspecified: Secondary | ICD-10-CM | POA: Insufficient documentation

## 2015-04-17 LAB — BASIC METABOLIC PANEL
Anion gap: 5 (ref 5–15)
BUN: 14 mg/dL (ref 6–20)
CALCIUM: 9.7 mg/dL (ref 8.9–10.3)
CO2: 31 mmol/L (ref 22–32)
CREATININE: 1 mg/dL (ref 0.44–1.00)
Chloride: 107 mmol/L (ref 101–111)
GFR calc Af Amer: >60 mL/min (ref >60)
GFR calc non Af Amer: >60 mL/min (ref >60)
GLUCOSE: 110 mg/dL — AB (ref 65–99)
Potassium: 4 mmol/L (ref 3.5–5.1)
Sodium: 143 mmol/L (ref 135–145)

## 2015-04-17 MED ORDER — MORPHINE SULFATE 30 MG PO TABS: ORAL_TABLET | ORAL | Status: DC

## 2015-04-17 MED ORDER — MORPHINE SULFATE 30 MG PO TABS
ORAL_TABLET | ORAL | Status: DC
Start: 2015-04-17 — End: 2015-04-17

## 2015-04-17 NOTE — Assessment & Plan Note (Signed)
S/p surgery on 03/07/15. Doing well with PT. Saw ortho recently and has f/up in 2 months. Required higher amount of morphine IR for September (200 tabs instead of 120/monthly), now only taking her usual baseline morphine IR.   Refilled 120 tabs of morphine 30mg  IR x3 months.

## 2015-04-17 NOTE — Progress Notes (Signed)
   Subjective:    Patient ID: Julie Williams, female    DOB: 16-Nov-1965, 49 y.o.   MRN: [id number]  HPI  49 yo female with hx of chronic pain, s/p R rotator cuff tear surgery 03/07/2015 here for follow up chronic pain, also having intermittent chest pain.  Chest pain: wakes up sometimes with pain, also experience pain while just sitting. When she has the pain she feels that it's worse with moving around. Feels the pain is coming from her shoulder/neck area.She is afraid of cardiac etiology and takes aspirin when this happens. Pain lasts about 30 mins and then resolves. No pain with exertion. No sob or palpitation/dizziness.   Review of Systems  Constitutional: Negative for fever, chills and fatigue.  Cardiovascular: Positive for chest pain. Negative for palpitations and leg swelling.  Gastrointestinal: Negative for nausea, vomiting, diarrhea and abdominal distention.  Endocrine: Negative.   Genitourinary: Negative for dysuria and pelvic pain.  Musculoskeletal: Negative.   Skin: Negative.   Allergic/Immunologic: Negative.   Neurological: Negative for dizziness, seizures and numbness.       Objective:   Physical Exam  Constitutional: She is oriented to person, place, and time. She appears well-developed and well-nourished. No distress.  HENT:  Head: Normocephalic and atraumatic.  Mouth/Throat: No oropharyngeal exudate.  Eyes: Pupils are equal, round, and reactive to light.  Neck: Normal range of motion.  Cardiovascular: Exam reveals no gallop and no friction rub.   No murmur heard. Pulmonary/Chest: Effort normal and breath sounds normal. No respiratory distress. She has no wheezes. She exhibits no tenderness.  Abdominal: Soft. Bowel sounds are normal.  Musculoskeletal: Normal range of motion. She exhibits no edema or tenderness.  Neurological: She is alert and oriented to person, place, and time. No cranial nerve deficit.    Filed Vitals:   04/17/15 1325  BP: 116/65  Pulse: 62    Temp: 97.6 F (36.4 C)         Assessment & Plan:  See problem based a&p.

## 2015-04-17 NOTE — Assessment & Plan Note (Signed)
Refilled 120 tabs of morphine 30mg  IR x3 months.

## 2015-04-17 NOTE — Assessment & Plan Note (Addendum)
The pain seems atypical, likely referred from her neck/shoulder. Also could be GERD related. Asked her to take her PPI properly (takes it with food currently).   Her 10% cardiac risk is very low 0.7%, with her atypical description of pain it is very unlikely that this is cardiac in etiology. We obtained an EKG to make sure no gross abnormalities are seen - was normal. HR 57, no ST changes. T wave looks somewhat peaked. Will get a BMET to make sure her K+ is ok. Last was 5.3 1 year ago.  K was fine. Let patient go home. Asked to go to the ED if having chest pain again.

## 2015-04-18 ENCOUNTER — Ambulatory Visit: Payer: No Typology Code available for payment source | Admitting: Physical Therapy

## 2015-04-18 DIAGNOSIS — M25511 Pain in right shoulder: Secondary | ICD-10-CM

## 2015-04-18 DIAGNOSIS — M75101 Unspecified rotator cuff tear or rupture of right shoulder, not specified as traumatic: Secondary | ICD-10-CM

## 2015-04-18 DIAGNOSIS — M6281 Muscle weakness (generalized): Secondary | ICD-10-CM

## 2015-04-18 DIAGNOSIS — M25611 Stiffness of right shoulder, not elsewhere classified: Secondary | ICD-10-CM
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
## 2015-04-19 NOTE — Progress Notes (Signed)
Case discussed with Dr. Genene Churn at time of visit. We reviewed the resident's history and exam and pertinent patient test results. I agree with the assessment, diagnosis, and plan of care documented in the resident's note with the following exception:  s/p rotator cuff surgery, thus morphine should no longer be necessary for that indication if the surgery was successful and accomplished its goal.  Therefore, I would no longer prescribe morphine for the rotator cuff issue, which has been addressed.  If she is requiring narcotics, it would be for the chronic pain syndrome.  We will continue to monitor function on the narcotics to assure it is improved as well as continue to view the [place] database and periodic random urine studies to assure compliance, no other unprescribed controlled substances, or diversion.

## 2015-04-24 ENCOUNTER — Encounter: Payer: Self-pay | Admitting: Physical Therapy

## 2015-04-24 ENCOUNTER — Ambulatory Visit: Payer: No Typology Code available for payment source

## 2015-04-25 ENCOUNTER — Encounter: Payer: Self-pay | Admitting: Physical Therapy

## 2015-04-25 ENCOUNTER — Ambulatory Visit: Payer: No Typology Code available for payment source

## 2015-04-25 DIAGNOSIS — M25611 Stiffness of right shoulder, not elsewhere classified: Secondary | ICD-10-CM

## 2015-04-25 DIAGNOSIS — M25511 Pain in right shoulder: Secondary | ICD-10-CM

## 2015-04-25 DIAGNOSIS — M75101 Unspecified rotator cuff tear or rupture of right shoulder, not specified as traumatic: Secondary | ICD-10-CM
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
## 2015-05-02 ENCOUNTER — Ambulatory Visit: Payer: Self-pay | Attending: Family Medicine | Admitting: Physical Therapy

## 2015-05-02 ENCOUNTER — Encounter: Payer: Self-pay | Admitting: Physical Therapy

## 2015-05-02 DIAGNOSIS — M25511 Pain in right shoulder: Secondary | ICD-10-CM

## 2015-05-02 DIAGNOSIS — M6281 Muscle weakness (generalized): Secondary | ICD-10-CM

## 2015-05-02 DIAGNOSIS — M25611 Stiffness of right shoulder, not elsewhere classified: Secondary | ICD-10-CM

## 2015-05-02 DIAGNOSIS — M75101 Unspecified rotator cuff tear or rupture of right shoulder, not specified as traumatic: Secondary | ICD-10-CM
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
## 2015-05-04 ENCOUNTER — Ambulatory Visit: Payer: Self-pay | Admitting: Physical Therapy

## 2015-05-09 ENCOUNTER — Encounter: Payer: Self-pay | Admitting: Physical Therapy

## 2015-05-09 ENCOUNTER — Ambulatory Visit: Payer: Self-pay | Admitting: Physical Therapy

## 2015-05-09 DIAGNOSIS — M25611 Stiffness of right shoulder, not elsewhere classified: Secondary | ICD-10-CM

## 2015-05-09 DIAGNOSIS — M75101 Unspecified rotator cuff tear or rupture of right shoulder, not specified as traumatic: Secondary | ICD-10-CM

## 2015-05-09 DIAGNOSIS — M25511 Pain in right shoulder: Secondary | ICD-10-CM

## 2015-05-09 DIAGNOSIS — M6281 Muscle weakness (generalized): Secondary | ICD-10-CM
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
## 2015-05-11 ENCOUNTER — Ambulatory Visit: Payer: Self-pay | Admitting: Physical Therapy

## 2015-05-11 DIAGNOSIS — M25511 Pain in right shoulder: Secondary | ICD-10-CM

## 2015-05-11 DIAGNOSIS — M25611 Stiffness of right shoulder, not elsewhere classified: Secondary | ICD-10-CM

## 2015-05-11 DIAGNOSIS — M75101 Unspecified rotator cuff tear or rupture of right shoulder, not specified as traumatic: Secondary | ICD-10-CM

## 2015-05-11 DIAGNOSIS — M6281 Muscle weakness (generalized): Secondary | ICD-10-CM
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
## 2015-05-15 ENCOUNTER — Encounter: Payer: Self-pay | Admitting: Physical Therapy

## 2015-05-15 ENCOUNTER — Encounter: Payer: Self-pay | Admitting: Internal Medicine

## 2015-05-16 ENCOUNTER — Encounter: Payer: Self-pay | Admitting: Physical Therapy

## 2015-05-18 ENCOUNTER — Ambulatory Visit: Payer: Self-pay | Admitting: Physical Therapy

## 2015-05-18 DIAGNOSIS — M6281 Muscle weakness (generalized): Secondary | ICD-10-CM

## 2015-05-18 DIAGNOSIS — M25611 Stiffness of right shoulder, not elsewhere classified: Secondary | ICD-10-CM

## 2015-05-18 DIAGNOSIS — M25511 Pain in right shoulder: Secondary | ICD-10-CM

## 2015-05-18 DIAGNOSIS — M75101 Unspecified rotator cuff tear or rupture of right shoulder, not specified as traumatic: Secondary | ICD-10-CM
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
## 2015-05-19 ENCOUNTER — Encounter: Payer: Self-pay | Admitting: Physical Therapy

## 2015-05-23 ENCOUNTER — Ambulatory Visit: Payer: Self-pay | Admitting: Physical Therapy

## 2015-05-23 ENCOUNTER — Encounter: Payer: Self-pay | Admitting: Physical Therapy

## 2015-05-23 DIAGNOSIS — M6281 Muscle weakness (generalized): Secondary | ICD-10-CM

## 2015-05-23 DIAGNOSIS — M25511 Pain in right shoulder: Secondary | ICD-10-CM

## 2015-05-23 DIAGNOSIS — M75101 Unspecified rotator cuff tear or rupture of right shoulder, not specified as traumatic: Secondary | ICD-10-CM

## 2015-05-23 DIAGNOSIS — M25611 Stiffness of right shoulder, not elsewhere classified: Secondary | ICD-10-CM
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
## 2015-05-25 ENCOUNTER — Encounter: Payer: Self-pay | Admitting: Physical Therapy

## 2015-05-25 ENCOUNTER — Ambulatory Visit: Payer: Self-pay | Admitting: Physical Therapy

## 2015-05-30 ENCOUNTER — Ambulatory Visit: Payer: Self-pay | Attending: Family Medicine | Admitting: Physical Therapy

## 2015-05-30 DIAGNOSIS — M6281 Muscle weakness (generalized): Secondary | ICD-10-CM | POA: Insufficient documentation

## 2015-05-30 DIAGNOSIS — M25511 Pain in right shoulder: Secondary | ICD-10-CM | POA: Insufficient documentation

## 2015-05-30 DIAGNOSIS — M75101 Unspecified rotator cuff tear or rupture of right shoulder, not specified as traumatic: Secondary | ICD-10-CM | POA: Insufficient documentation

## 2015-05-30 DIAGNOSIS — M25611 Stiffness of right shoulder, not elsewhere classified: Secondary | ICD-10-CM | POA: Insufficient documentation
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
## 2015-06-01 ENCOUNTER — Encounter: Payer: Self-pay | Admitting: Physical Therapy

## 2015-06-06 ENCOUNTER — Ambulatory Visit: Payer: Self-pay | Admitting: Physical Therapy

## 2015-06-06 DIAGNOSIS — M75101 Unspecified rotator cuff tear or rupture of right shoulder, not specified as traumatic: Secondary | ICD-10-CM

## 2015-06-06 DIAGNOSIS — M25511 Pain in right shoulder: Secondary | ICD-10-CM

## 2015-06-06 DIAGNOSIS — M25611 Stiffness of right shoulder, not elsewhere classified: Secondary | ICD-10-CM

## 2015-06-06 DIAGNOSIS — M6281 Muscle weakness (generalized): Secondary | ICD-10-CM
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
## 2015-06-08 ENCOUNTER — Ambulatory Visit: Payer: Self-pay | Admitting: Physical Therapy

## 2015-06-19 ENCOUNTER — Encounter: Payer: Self-pay | Admitting: Internal Medicine

## 2015-06-19 DIAGNOSIS — M47812 Spondylosis without myelopathy or radiculopathy, cervical region: Secondary | ICD-10-CM

## 2015-06-19 HISTORY — DX: Spondylosis without myelopathy or radiculopathy, cervical region: M47.812

## 2015-06-20 ENCOUNTER — Ambulatory Visit: Payer: Self-pay | Admitting: Physical Therapy

## 2015-06-22 ENCOUNTER — Ambulatory Visit: Payer: Self-pay | Admitting: Physical Therapy

## 2015-06-22 DIAGNOSIS — M25611 Stiffness of right shoulder, not elsewhere classified: Secondary | ICD-10-CM

## 2015-06-22 DIAGNOSIS — M6281 Muscle weakness (generalized): Secondary | ICD-10-CM

## 2015-06-22 DIAGNOSIS — M75101 Unspecified rotator cuff tear or rupture of right shoulder, not specified as traumatic: Secondary | ICD-10-CM

## 2015-06-22 DIAGNOSIS — M25511 Pain in right shoulder: Secondary | ICD-10-CM
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
## 2015-06-27 ENCOUNTER — Ambulatory Visit: Payer: Self-pay | Attending: Family Medicine | Admitting: Physical Therapy

## 2015-06-27 DIAGNOSIS — M6281 Muscle weakness (generalized): Secondary | ICD-10-CM | POA: Insufficient documentation

## 2015-06-27 DIAGNOSIS — M25511 Pain in right shoulder: Secondary | ICD-10-CM | POA: Insufficient documentation

## 2015-06-27 DIAGNOSIS — M75101 Unspecified rotator cuff tear or rupture of right shoulder, not specified as traumatic: Secondary | ICD-10-CM | POA: Insufficient documentation

## 2015-06-27 DIAGNOSIS — M25611 Stiffness of right shoulder, not elsewhere classified: Secondary | ICD-10-CM | POA: Insufficient documentation

## 2015-06-29 ENCOUNTER — Ambulatory Visit: Payer: Self-pay | Admitting: Physical Therapy

## 2015-06-29 DIAGNOSIS — M6281 Muscle weakness (generalized): Secondary | ICD-10-CM

## 2015-06-29 DIAGNOSIS — M25611 Stiffness of right shoulder, not elsewhere classified: Secondary | ICD-10-CM

## 2015-06-29 DIAGNOSIS — M75101 Unspecified rotator cuff tear or rupture of right shoulder, not specified as traumatic: Secondary | ICD-10-CM

## 2015-06-29 DIAGNOSIS — M25511 Pain in right shoulder: Secondary | ICD-10-CM

## 2015-06-29 NOTE — Patient Instructions (Signed)
Over Head Pull: Narrow Grip       On back, knees bent, feet flat, band across thighs, elbows straight but relaxed. Pull hands apart (start). Keeping elbows straight, bring arms up and over head, hands toward floor. Keep pull steady on band. Hold momentarily. Return slowly, keeping pull steady, back to start. Repeat __8_ times. Band color ___yellow___   Side Pull: Double Arm   On back, knees bent, feet flat. Arms perpendicular to body, shoulder level, elbows straight but relaxed. Pull arms out to sides, elbows straight. Resistance band comes across collarbones, hands toward floor. Hold momentarily. Slowly return to starting position. Repeat __8_ times. Band color __yellow___   Elmer Picker  Modifications:  Hold Right hand down by side, pull left up and out;                                                                            OR both arms up, pull right arm down toward hip                                                                            OR hold right arm at 90 degrees, pull left arm overhead  On back, knees bent, feet flat, left hand on left hip, right hand above left. Pull right arm DIAGONALLY (hip to shoulder) across chest. Bring right arm along head toward floor. Hold momentarily. Slowly return to starting position. Repeat __8_ times. Do with left arm. Band color ___yellow___   Shoulder Rotation: Double Arm   On back, knees bent, feet flat, elbows tucked at sides, bent 90, hands palms up. Pull hands apart and down toward floor, keeping elbows near sides. Hold momentarily. Slowly return to starting position. Repeat __8_ times. Band color _yellow_____
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
## 2015-07-04 ENCOUNTER — Ambulatory Visit: Payer: Self-pay | Admitting: Physical Therapy

## 2015-07-10 ENCOUNTER — Encounter: Payer: Self-pay | Admitting: Internal Medicine

## 2015-07-10 ENCOUNTER — Ambulatory Visit (INDEPENDENT_AMBULATORY_CARE_PROVIDER_SITE_OTHER): Payer: Self-pay | Admitting: Internal Medicine

## 2015-07-10 ENCOUNTER — Ambulatory Visit (HOSPITAL_COMMUNITY)
Admission: RE | Admit: 2015-07-10 | Discharge: 2015-07-10 | Disposition: A | Discharge status: Home / Self Care | Payer: Self-pay | Source: Ambulatory Visit | Attending: Internal Medicine | Admitting: Internal Medicine

## 2015-07-10 VITALS — BP 156/96 | HR 56 | Temp 98.4°F | Wt 140.1 lb

## 2015-07-10 DIAGNOSIS — G894 Chronic pain syndrome: Secondary | ICD-10-CM

## 2015-07-10 DIAGNOSIS — M79671 Pain in right foot: Secondary | ICD-10-CM | POA: Insufficient documentation

## 2015-07-10 DIAGNOSIS — I10 Essential (primary) hypertension: Secondary | ICD-10-CM

## 2015-07-10 DIAGNOSIS — F32A Depression, unspecified: Secondary | ICD-10-CM

## 2015-07-10 DIAGNOSIS — M75101 Unspecified rotator cuff tear or rupture of right shoulder, not specified as traumatic: Secondary | ICD-10-CM

## 2015-07-10 DIAGNOSIS — F329 Major depressive disorder, single episode, unspecified: Secondary | ICD-10-CM

## 2015-07-10 DIAGNOSIS — K219 Gastro-esophageal reflux disease without esophagitis: Secondary | ICD-10-CM

## 2015-07-10 HISTORY — DX: Essential (primary) hypertension: I10

## 2015-07-10 MED ORDER — HYDROCHLOROTHIAZIDE 12.5 MG PO TABS: 12.5000 mg | ORAL_TABLET | Freq: Every day | ORAL | Status: DC

## 2015-07-10 MED ORDER — MORPHINE SULFATE 30 MG PO TABS: ORAL_TABLET | ORAL | Status: DC

## 2015-07-10 MED ORDER — SERTRALINE HCL 100 MG PO TABS: 200.0000 mg | ORAL_TABLET | Freq: Every day | ORAL | Status: DC

## 2015-07-10 MED ORDER — OMEPRAZOLE 40 MG PO CPDR: 40.0000 mg | DELAYED_RELEASE_CAPSULE | Freq: Every day | ORAL | Status: DC

## 2015-07-10 NOTE — Assessment & Plan Note (Signed)
Has chronic neck pain. Has TTP on Cspine processes. Cspine MRI showing C7-T1 small central disc herniation, T1-T2 tiny disc herniation, and C4-C5 mild left foraminal stenosis.   Also has chronic lower back and upper back pain. Has some numbness/tingling b/l on her hands which may be 2/2 to her DJD of Cspine.  Not interested in PT. Is already on pain medication. Next option is surgery as she has been suffering from his for many months now and has numbness on both hands already (not in the median nerve distribution, so less likely carpal tunnel).  Refilled her MS IR 30mg  tablets 0.5-1 tab q4-6hr prn 120 tabs for 3 months. This should last until 10/09/2014.   Asked her to give [place] the Spine Surgery # at [facility] so we can facilitate her appt with them for surgical evaluation.

## 2015-07-10 NOTE — Assessment & Plan Note (Addendum)
Filed Vitals:   07/10/15 1421  BP: 156/96  Pulse: 56  Temp: 98.4 F (36.9 C)   BP is elevated today but has been normal in the past. I thought about starting HCTZ but I decided not to. I will see what her BP is next visit and if abnormal will start HCTZ in the future.

## 2015-07-10 NOTE — Patient Instructions (Addendum)
Follow up in 3 months.  Will get xray of your foot today.  Give [place] neuro surgery's number for [facility].   Result Impression   1.  New small central disc herniation at C7-T1 with only mild amount of mass effect on the ventral thecal sac. No foraminal stenosis. 2.  Additional new tiny central disc herniation at T1-T2 without stenosis. 3.  Mild left foraminal stenosis at C4-C5 as a result of uncovertebral degenerative changes. 4.  No acute fracture or ligamentous injury.   Result Narrative  MRI CERVICAL SPINE WITHOUT CONTRAST, 06/17/2015 10:04 AM   INDICATION:  FOLLOW UP CERVICAL SPINE FUSIONM54.2 Chronic neck pain G89.29 Chronic neck pain Z98.1 S/P cervical spinal fusion   COMPARISON: MR dated 10/06/2011   TECHNIQUE: Multiplanar, multi-sequence surface-coil MR imaging of the cervical spine was performed without contrast.   LEVELS IMAGED: Foramen magnum to upper thoracic region.   FINDINGS:  Alignment: Within normal limits. Vertebrae: No marrow signal abnormalities to suggest neoplasm. Postsurgical changes related to C3-C6 ACDF with anterior plate and screws and intervening interbody grafts. Spinal cord: Normal signal and contour. Cervicocranial junction: No focal abnormality. C1-C2: No focal abnormality. C2-C3: No focal abnormality. C3-C4: No focal abnormality. C4-C5: Uncovertebral hypertrophy results in mild left foraminal stenosis. No central stenosis. C5-C6: No focal abnormality. C6-C7: No focal abnormality. C7-T1: New/increased small central disc herniation. Mild mass effect on the ventral thecal sac without canal stenosis.Marland Kitchen Upper Thoracic Spine: New small central disc herniation at T1-T2 without central or foraminal stenosis.

## 2015-07-10 NOTE — Progress Notes (Signed)
Subjective:    Patient ID: Julie Williams, female    DOB: 1965/10/18, 49 y.o.   MRN: [id number]  HPI  49 yo female with chronic neck pain and shoulder pain.   Had C3-C6 ACDF. MRI done 06/17/15 shows new increased central disc herniation C7-T1, no canal stenosis. New small disc hernia T1-T2 without central or foraminal stenosis.   Dr. Berenice Primas performed R rotator cuff tear surgery 03/07/2015. Still has pain on the right shoulder but improved. Finished PT because she felt she is not progressing at all. Benefited from PT somewhat.   Has chronic neck pain and upper back pain, along with b/l hand numbness on both entire hands (not just median or ulner region). No elbow pain. No radiculopathic pain from neck to arms. States numbness improves if she extends her Cspine. Did NCT at [place]. I can't find the result in [place].  MRI of Cspine showed the following:  On 06/17/2015 Cspine MRI 1. New small central disc herniation at C7-T1 with only mild amount of mass effect on the ventral thecal sac. No foraminal stenosis. 2. Additional new tiny central disc herniation at T1-T2 without stenosis. 3. Mild left foraminal stenosis at C4-C5 as a result of uncovertebral degenerative changes. 4. No acute fracture or ligamentous injury.    At [facility] Dr. Berenice Primas referred her to Spine Surgery for her Cspine DJD. Still waiting for them to give her an appt. I told her to give [place] their number to facilitate the appt.   Depression is better on zoloft but not 100% controlled. Denies SI/HI.   GERD controlled with PPI.   Review of Systems  Constitutional: Negative for fever and chills.  HENT: Negative for congestion.   Eyes: Negative for redness and visual disturbance.  Respiratory: Negative for chest tightness, shortness of breath and wheezing.   Cardiovascular: Negative for chest pain and palpitations.  Gastrointestinal: Negative for nausea, vomiting, abdominal pain and diarrhea.  Genitourinary:  Negative for dysuria and difficulty urinating.  Musculoskeletal: Positive for back pain and neck pain.  Neurological: Negative for dizziness, weakness, numbness and headaches.  Psychiatric/Behavioral: Positive for dysphoric mood. Negative for suicidal ideas and agitation.       Objective:   Physical Exam  Constitutional: She is oriented to person, place, and time. She appears well-developed and well-nourished. No distress.  HENT:  Head: Normocephalic and atraumatic.  Mouth/Throat: Oropharynx is clear and moist. No oropharyngeal exudate.  Eyes: Conjunctivae and EOM are normal. Pupils are equal, round, and reactive to light. Right eye exhibits no discharge. Left eye exhibits no discharge. No scleral icterus.  Neck: Neck supple. Spinous process tenderness present. No edema and no erythema present. No thyromegaly present.  Has tenderness to palpation over Cspine processes. No redness or erythema. Good ROM of Cspine.   Cardiovascular: Normal rate, regular rhythm, S1 normal, S2 normal and normal heart sounds.  Exam reveals no gallop and no friction rub.   No murmur heard. Pulmonary/Chest: Effort normal and breath sounds normal. No respiratory distress. She has no wheezes. She has no rales. She exhibits no tenderness.  Abdominal: Soft. Bowel sounds are normal. She exhibits no distension and no mass. There is no tenderness. There is no rebound and no guarding.  Musculoskeletal: Normal range of motion. She exhibits no edema or tenderness.       Feet:  Has bony prominence just medial to right first MTP. No erythema or swelling noted.   Lymphadenopathy:    She has no cervical adenopathy.  Neurological: She is alert and oriented to person, place, and time. She has normal strength and normal reflexes. No cranial nerve deficit or sensory deficit.  Skin: No rash noted. She is not diaphoretic. No erythema. No pallor.  Psychiatric: She has a normal mood and affect.    Filed Vitals:   07/10/15 1421    BP: 156/96  Pulse: 56  Temp: 98.4 F (36.9 C)        Assessment & Plan:  See problem based A&P.

## 2015-07-10 NOTE — Assessment & Plan Note (Signed)
Doing well. Refilled omeprazole.

## 2015-07-10 NOTE — Assessment & Plan Note (Addendum)
Has bony prominence on the medial aspect of the right 1st MTP joint. No erythema or swelling/warmth. I think this is bony spur 2/2 to osteoarthritis. Less likely inflammatory process such as gout without signs of inflammation. Last Uric acid was 5.7. No fever,chills.  Will get Xray of R foot to evaluate this further and rule out fracture.   >>>xray showed just degenerative changes as I thought. This was relayed to the patient by clinic staff.

## 2015-07-10 NOTE — Assessment & Plan Note (Signed)
Still having pain on the shoulder that's worse with activities. Improving compared to prior encounter. Asked her to continue usual activities and exercises.

## 2015-07-10 NOTE — Assessment & Plan Note (Signed)
States that she still feels depressed and tearful on some days. zoloft is helping but not completely treating her mood. Denies SI/HI.  This may be 2/2 to her pain as well.  Will increase zoloft to 200mg  daily. Asked to take 150mg  for 3 days then take 200mg  daily.

## 2015-07-11 ENCOUNTER — Telehealth: Payer: Self-pay | Admitting: Internal Medicine

## 2015-07-11 NOTE — Telephone Encounter (Signed)
Sending to dr Genene Churn

## 2015-07-11 NOTE — Telephone Encounter (Signed)
Pt called requesting x ray result.

## 2015-07-12 NOTE — Telephone Encounter (Signed)
Pt calling again requesting xray results-Pt informed that xrays were negative for fractures or dislocations.  Films did show degenerative changes which was explained to patient as age-related arthritic changes-pt wants know what to take, she's already on morphine, recommended she can try otc tylenol.  Phone call complete.Despina Hidden Cassady11/16/20163:45 PM

## 2015-07-19 NOTE — Progress Notes (Signed)
Internal Medicine Clinic Attending  Case discussed with Dr. Ahmed soon after the resident saw the patient.  We reviewed the resident's history and exam and pertinent patient test results.  I agree with the assessment, diagnosis, and plan of care documented in the resident's note. 

## 2015-07-28 ENCOUNTER — Other Ambulatory Visit: Payer: Self-pay | Admitting: Internal Medicine

## 2015-07-28 DIAGNOSIS — F329 Major depressive disorder, single episode, unspecified: Secondary | ICD-10-CM

## 2015-07-28 DIAGNOSIS — F32A Depression, unspecified: Secondary | ICD-10-CM

## 2015-07-28 DIAGNOSIS — K219 Gastro-esophageal reflux disease without esophagitis: Secondary | ICD-10-CM

## 2015-07-28 MED ORDER — SERTRALINE HCL 100 MG PO TABS: 200.0000 mg | ORAL_TABLET | Freq: Every day | ORAL | Status: DC

## 2015-07-28 MED ORDER — OMEPRAZOLE 40 MG PO CPDR: 40.0000 mg | DELAYED_RELEASE_CAPSULE | Freq: Every day | ORAL | Status: DC

## 2015-07-28 NOTE — Telephone Encounter (Signed)
Pt requesting omeprazole and sertraline to be filled @ [facility] on [address].

## 2015-07-28 NOTE — Telephone Encounter (Signed)
Pt unable to get refills on these 2 medications through [facility] until she returns paperwork, then she will need to wait on delivery.  Unable to reach [facility] today to confirm this information but pt stating that she will be out of these 2 in a few days.  Requesting [place] send 30 days to [facility] in order to give time to get things straight.

## 2015-08-02 ENCOUNTER — Telehealth: Payer: Self-pay | Admitting: Internal Medicine

## 2015-08-02 NOTE — Telephone Encounter (Signed)
Pt requesting the nurse to call back. Will not tell me what she want.

## 2015-08-02 NOTE — Telephone Encounter (Signed)
Pt calls and states she needs a letter stating that she is competent and capable of handling her affairs, this should be addressed to social security disability adm. , she states that it is due to her being on morphine, that they have told her that she is approved for disability but that they are going to appoint someone to pay her bills and handle her money because she is on pain meds, she needs a letter from her md stating that is not needed

## 2015-08-03 NOTE — Telephone Encounter (Signed)
My wife my just had her cesction yesterday. I will not be able to come sign the paper until end of December. If it's urgent please have someone else fill it up for me.  Thanks, Trayonna Bachmeier.

## 2015-08-03 NOTE — Telephone Encounter (Signed)
Dr. Genene Churn, I have information directly from social security about Representative Payee, there is a form too.  I will place in your mailbox.  Edwena Blow

## 2015-08-03 NOTE — Telephone Encounter (Signed)
Information and form placed in Dr. Brandt Loosen box.  CSW will sign off.

## 2015-08-04 ENCOUNTER — Telehealth: Payer: Self-pay | Admitting: Internal Medicine

## 2015-08-04 ENCOUNTER — Ambulatory Visit (INDEPENDENT_AMBULATORY_CARE_PROVIDER_SITE_OTHER): Payer: Self-pay | Admitting: Internal Medicine

## 2015-08-04 ENCOUNTER — Encounter: Payer: Self-pay | Admitting: Internal Medicine

## 2015-08-04 VITALS — BP 133/93 | HR 71 | Temp 98.3°F | Ht 65.0 in | Wt 142.2 lb

## 2015-08-04 DIAGNOSIS — F1920 Other psychoactive substance dependence, uncomplicated: Secondary | ICD-10-CM

## 2015-08-04 DIAGNOSIS — Z23 Encounter for immunization: Secondary | ICD-10-CM

## 2015-08-04 NOTE — Assessment & Plan Note (Signed)
Because of dependency on chronic narcotic medications, she presented for evaluation of cognition and decision making. Dr. Juleen China performed a MMSE that showed normal cognition. She has a good functional status, independent in all areas of her life. To this point she has not had significant cognitive deficits apparent to [place] in the clinic and does not seem to have any adverse cognitive side effects from chronic morphine use. Plan is to continue morphine tablets as prescribed with regular close follow up.

## 2015-08-04 NOTE — Patient Instructions (Signed)
It was nice meeting you today.  Please let us know if you have any future questions regarding the forms we filled out today for your disability.

## 2015-08-04 NOTE — Telephone Encounter (Signed)
i have attempted to call pt, lm for rtc She has called back, appt set for 1545 dr wallace

## 2015-08-04 NOTE — Telephone Encounter (Signed)
  After talking with Golden Hurter, I think we are going to need to have the patient come in to see [place] to fill out this form. We will need to more clearly evaluate her mental status, and probably do a MOCHA or MMSE exam for objective data. Please call her and set her up with one of the covering residents as Dr. Genene Churn is out on parental leave. I will put this form back in Dr. Brandt Loosen box and they can fill it out when she comes in for a visit. Thanks.

## 2015-08-04 NOTE — Progress Notes (Signed)
Internal Medicine Clinic Attending  I saw and evaluated the patient.  I personally confirmed the key portions of the history and exam documented by Dr. Wallace and I reviewed pertinent patient test results.  The assessment, diagnosis, and plan were formulated together and I agree with the documentation in the resident's note. 
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
## 2015-08-04 NOTE — Telephone Encounter (Signed)
Pt requesting the nurse to call back regarding letter. Please call pt back.

## 2015-08-04 NOTE — Assessment & Plan Note (Signed)
Assessment: Patient successfully completed MMSE with score of 30/30.  She appears capable of making decisions and handling her affairs.  I can not see any reason why her current diagnosis' and medications would preclude her from being able to do so  Plan: - Representative Payee Social Security Form completed with letter provided to patient stating she is capable - also given flu shot today

## 2015-08-04 NOTE — Addendum Note (Signed)
Addended by: Lalla Brothers T on: 08/04/2015 04:59 PM   Modules accepted: Level of Service

## 2015-09-01 ENCOUNTER — Telehealth: Payer: Self-pay | Admitting: Internal Medicine

## 2015-09-01 NOTE — Telephone Encounter (Signed)
Pt requesting morphine 30 mg to be filled.

## 2015-09-01 NOTE — Telephone Encounter (Signed)
Spoke with patient, she has a refill on file at [facility] but wants to pick it up 1 day early because of the upcoming bad weather.  Are you okay with this Dr. Genene Churn? Confirmed with pharmacy that she is within 1 day of fill

## 2015-09-01 NOTE — Telephone Encounter (Signed)
Pharmacy aware
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
## 2015-09-01 NOTE — Telephone Encounter (Signed)
I know this is not urgent, but with upcoming weather pt being insistent.  Is 1 day early OK?

## 2015-10-09 DIAGNOSIS — Z9889 Other specified postprocedural states: Secondary | ICD-10-CM | POA: Diagnosis not present

## 2015-10-09 DIAGNOSIS — M542 Cervicalgia: Secondary | ICD-10-CM | POA: Diagnosis not present

## 2015-10-09 DIAGNOSIS — Z981 Arthrodesis status: Secondary | ICD-10-CM | POA: Diagnosis not present

## 2015-10-09 DIAGNOSIS — M1288 Other specific arthropathies, not elsewhere classified, other specified site: Secondary | ICD-10-CM | POA: Diagnosis not present

## 2015-10-09 DIAGNOSIS — G8929 Other chronic pain: Secondary | ICD-10-CM | POA: Diagnosis not present

## 2015-10-09 DIAGNOSIS — M25511 Pain in right shoulder: Secondary | ICD-10-CM | POA: Diagnosis not present

## 2015-10-13 ENCOUNTER — Telehealth: Payer: Self-pay | Admitting: Internal Medicine

## 2015-10-13 NOTE — Telephone Encounter (Signed)
APPT REMINDER CALL, LMTCB IF SHE NEEDS TO CANCEL °

## 2015-10-16 ENCOUNTER — Ambulatory Visit (INDEPENDENT_AMBULATORY_CARE_PROVIDER_SITE_OTHER): Payer: PPO | Admitting: Internal Medicine

## 2015-10-16 VITALS — BP 137/88 | HR 72 | Temp 98.0°F | Wt 142.3 lb

## 2015-10-16 DIAGNOSIS — F1920 Other psychoactive substance dependence, uncomplicated: Secondary | ICD-10-CM | POA: Diagnosis not present

## 2015-10-16 DIAGNOSIS — M5489 Other dorsalgia: Secondary | ICD-10-CM

## 2015-10-16 DIAGNOSIS — M545 Low back pain: Secondary | ICD-10-CM | POA: Diagnosis not present

## 2015-10-16 DIAGNOSIS — M25511 Pain in right shoulder: Secondary | ICD-10-CM | POA: Diagnosis not present

## 2015-10-16 DIAGNOSIS — G894 Chronic pain syndrome: Secondary | ICD-10-CM

## 2015-10-16 DIAGNOSIS — G8929 Other chronic pain: Secondary | ICD-10-CM

## 2015-10-16 DIAGNOSIS — M75101 Unspecified rotator cuff tear or rupture of right shoulder, not specified as traumatic: Secondary | ICD-10-CM

## 2015-10-16 DIAGNOSIS — I10 Essential (primary) hypertension: Secondary | ICD-10-CM

## 2015-10-16 DIAGNOSIS — F32A Depression, unspecified: Secondary | ICD-10-CM

## 2015-10-16 DIAGNOSIS — F329 Major depressive disorder, single episode, unspecified: Secondary | ICD-10-CM

## 2015-10-16 DIAGNOSIS — Z79891 Long term (current) use of opiate analgesic: Secondary | ICD-10-CM

## 2015-10-16 MED ORDER — BUPROPION HCL ER (XL) 150 MG PO TB24: 300.0000 mg | ORAL_TABLET | ORAL | Status: DC

## 2015-10-16 MED ORDER — MORPHINE SULFATE 30 MG PO TABS: ORAL_TABLET | ORAL | Status: DC

## 2015-10-16 MED ORDER — SERTRALINE HCL 50 MG PO TABS: 50.0000 mg | ORAL_TABLET | Freq: Every day | ORAL | Status: DC

## 2015-10-16 NOTE — Progress Notes (Signed)
Copy of Pain Contract given to pt. 

## 2015-10-16 NOTE — Assessment & Plan Note (Signed)
States she cannot function without her pain medication. She is able to get up from bed, do household chores such as cooking/preparing food, doing her laundry because of her pain medication. Without it she is not able to function at all. We discuss need for tapering in the future from 120 tablets slowly by 10%. She feels she tried other medication including meloxicam, gabapentin, nsaids without any help.  Updated pain contract. Checked UDS.  Refilled morphine 30mg  120 tablets for 3 months.

## 2015-10-16 NOTE — Patient Instructions (Addendum)
Filled out your disability form.  Refilled your pain medication. We will need to continue discussing ways to titrate your medication down.  For your depression, prescribed wellbutrin extended release. Take 150mg  tablet (1 tablet) for 4 days. If tolerated, then start taking 2 tablets daily.   Titrate your zoloft down: 100mg  total (50mg  x2 tabs) for 1 week, then 50mg  (one tablet for 1 week), then off.

## 2015-10-16 NOTE — Progress Notes (Signed)
   Subjective:    Patient ID: Julie Williams, female    DOB: [birth year], 50 y.o.   MRN: [id number]  HPI  50 yo female with chronic pain on chronic narcotic medication, HTN, here for follow up of HTN and chronic pain.    Chronic pain: chronic lower and upper back pain with numbness/tingling on both hands. Has right shoulder pain. Recently had steroid injection by Dr. Erlinda Hong at [facility] on right shoulder. On chronic MS IR. Currently on MS IR 30mg  tablets 0.5-1 tablet q4-6hr PRN 120 tabs per month. We had long discussion about the expectation from pain medicine and the goals of being on it. She states she need the pain medicine in order to do anything. She is unable to get up from bed, do any household activities (cooking, laundry, etc) without the pain med. It allows her to have these functions. She is still unable to do any social activities because of the pain despite being on medications. We discussed the need to taper down her medication slowly and talked about alternative meds. She already tried gabapentin, amitriptyline, and maloxicam without any improvement. We updated the pain contract. Goal is to continue to function and do the things as listed above. Discussed she is on >90 MME (120 mg for her) and this is risky with all the side effects. Told her that we will need to discuss tapering on every visit.   HTN; doing well. Taking her meds.   Depression: had been on wellbutrin in the past but that was d/ced due to insurance coverage change. I had her on zoloft which worked great for her. However, she has gained about 30+ lb with this and does not want to be on it any more. She has new insurance now and wants to go back to wellbutrin. Wants to taper off Zoloft.   Review of Systems  Constitutional: Negative for fever and chills.  HENT: Negative for congestion and sore throat.   Respiratory: Negative for chest tightness and shortness of breath.   Cardiovascular: Negative for chest pain,  palpitations and leg swelling.  Gastrointestinal: Negative.   Endocrine: Negative.   Musculoskeletal: Positive for back pain, arthralgias and neck pain. Negative for joint swelling, gait problem and neck stiffness.  Skin: Negative.   Neurological: Negative for facial asymmetry.  Psychiatric/Behavioral: Negative for suicidal ideas and self-injury.       Objective:   Physical Exam  Constitutional: She is oriented to person, place, and time. She appears well-developed and well-nourished. No distress.  Overall flat affect but smiling more than last few encounters.   HENT:  Head: Normocephalic and atraumatic.  Eyes: EOM are normal. Pupils are equal, round, and reactive to light.  Neck: Normal range of motion.  Cardiovascular: Normal rate and regular rhythm.  Exam reveals no friction rub.   No murmur heard. Pulmonary/Chest: Effort normal and breath sounds normal. No respiratory distress. She has no rales.  Abdominal: Soft. Bowel sounds are normal. She exhibits no distension. There is no tenderness.  Musculoskeletal: Normal range of motion. She exhibits no edema or tenderness.  Neurological: She is alert and oriented to person, place, and time.  Psychiatric: Her speech is normal and behavior is normal. Thought content normal. She exhibits a depressed mood.    Filed Vitals:   10/16/15 1527  BP: 137/88  Pulse: 72  Temp: 98 F (36.7 C)        Assessment & Plan:  See problem based a&p.

## 2015-10-17 ENCOUNTER — Encounter: Payer: Self-pay | Admitting: Internal Medicine

## 2015-10-17 NOTE — Progress Notes (Signed)
Internal Medicine Clinic Attending  Case discussed with Dr. Ahmed at the time of the visit.  We reviewed the resident's history and exam and pertinent patient test results.  I agree with the assessment, diagnosis, and plan of care documented in the resident's note. 

## 2015-10-21 LAB — TOXASSURE SELECT,+ANTIDEPR,UR: PDF: 0

## 2015-11-21 ENCOUNTER — Telehealth: Payer: Self-pay | Admitting: Internal Medicine

## 2015-11-21 ENCOUNTER — Other Ambulatory Visit: Payer: Self-pay | Admitting: *Deleted

## 2015-11-21 NOTE — Telephone Encounter (Signed)
Requesting epi pen to be filled. Also please call pt back.

## 2015-11-22 ENCOUNTER — Other Ambulatory Visit: Payer: Self-pay | Admitting: Internal Medicine

## 2015-11-22 MED ORDER — EPINEPHRINE 0.3 MG/0.3ML IJ SOAJ: 0.3000 mg | Freq: Once | INTRAMUSCULAR | Status: DC

## 2015-11-23 ENCOUNTER — Emergency Department (HOSPITAL_COMMUNITY)
Admission: EM | Admit: 2015-11-23 | Discharge: 2015-11-23 | Disposition: A | Discharge status: Home / Self Care | Payer: PPO | Attending: Emergency Medicine | Admitting: Emergency Medicine

## 2015-11-23 ENCOUNTER — Encounter (HOSPITAL_COMMUNITY): Payer: Self-pay

## 2015-11-23 DIAGNOSIS — M25511 Pain in right shoulder: Secondary | ICD-10-CM | POA: Diagnosis not present

## 2015-11-23 DIAGNOSIS — I10 Essential (primary) hypertension: Secondary | ICD-10-CM | POA: Diagnosis not present

## 2015-11-23 DIAGNOSIS — R079 Chest pain, unspecified: Secondary | ICD-10-CM | POA: Diagnosis not present

## 2015-11-23 LAB — CBC
HEMATOCRIT: 43.8 % (ref 36.0–46.0)
Hemoglobin: 14.1 g/dL (ref 12.0–15.0)
MCH: 31.5 pg (ref 26.0–34.0)
MCHC: 32.2 g/dL (ref 30.0–36.0)
MCV: 98 fL (ref 78.0–100.0)
Platelets: 209 10*3/uL (ref 150–400)
RBC: 4.47 MIL/uL (ref 3.87–5.11)
RDW: 12.9 % (ref 11.5–15.5)
WBC: 6 10*3/uL (ref 4.0–10.5)

## 2015-11-23 LAB — COMPREHENSIVE METABOLIC PANEL
ALT: 40 U/L (ref 14–54)
ANION GAP: 12 (ref 5–15)
AST: 39 U/L (ref 15–41)
Albumin: 4.5 g/dL (ref 3.5–5.0)
Alkaline Phosphatase: 78 U/L (ref 38–126)
BILIRUBIN TOTAL: 1.2 mg/dL (ref 0.3–1.2)
BUN: 13 mg/dL (ref 6–20)
CO2: 25 mmol/L (ref 22–32)
Calcium: 9.9 mg/dL (ref 8.9–10.3)
Chloride: 104 mmol/L (ref 101–111)
Creatinine, Ser: 0.89 mg/dL (ref 0.44–1.00)
GFR calc Af Amer: >60 mL/min (ref >60)
Glucose, Bld: 128 mg/dL — ABNORMAL HIGH (ref 65–99)
POTASSIUM: 4.1 mmol/L (ref 3.5–5.1)
Sodium: 141 mmol/L (ref 135–145)
TOTAL PROTEIN: 7.5 g/dL (ref 6.5–8.1)

## 2015-11-23 LAB — I-STAT TROPONIN, ED: TROPONIN I, POC: 0.01 ng/mL (ref 0.00–0.08)
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
## 2015-11-23 NOTE — Telephone Encounter (Signed)
This has been taken care of, refill sent

## 2015-11-23 NOTE — ED Notes (Signed)
Patient came to nurse first states will leave and spoke with registration to pay her copay. Nurse asked patient to say and see a doctor. Stated does not want to wait and will be leaving.

## 2015-12-01 ENCOUNTER — Telehealth: Payer: Self-pay | Admitting: Internal Medicine

## 2015-12-04 ENCOUNTER — Ambulatory Visit (HOSPITAL_COMMUNITY)
Admission: RE | Admit: 2015-12-04 | Discharge: 2015-12-04 | Disposition: A | Discharge status: Home / Self Care | Payer: PPO | Source: Ambulatory Visit | Attending: Internal Medicine | Admitting: Internal Medicine

## 2015-12-04 ENCOUNTER — Encounter: Payer: Self-pay | Admitting: Internal Medicine

## 2015-12-04 ENCOUNTER — Ambulatory Visit (INDEPENDENT_AMBULATORY_CARE_PROVIDER_SITE_OTHER): Payer: PPO | Admitting: Internal Medicine

## 2015-12-04 VITALS — BP 112/73 | HR 76 | Temp 98.1°F | Wt 143.4 lb

## 2015-12-04 DIAGNOSIS — I10 Essential (primary) hypertension: Secondary | ICD-10-CM

## 2015-12-04 DIAGNOSIS — Z79891 Long term (current) use of opiate analgesic: Secondary | ICD-10-CM

## 2015-12-04 DIAGNOSIS — F1920 Other psychoactive substance dependence, uncomplicated: Secondary | ICD-10-CM

## 2015-12-04 DIAGNOSIS — I200 Unstable angina: Secondary | ICD-10-CM

## 2015-12-04 DIAGNOSIS — M5030 Other cervical disc degeneration, unspecified cervical region: Secondary | ICD-10-CM | POA: Diagnosis not present

## 2015-12-04 DIAGNOSIS — K219 Gastro-esophageal reflux disease without esophagitis: Secondary | ICD-10-CM

## 2015-12-04 DIAGNOSIS — M47812 Spondylosis without myelopathy or radiculopathy, cervical region: Secondary | ICD-10-CM

## 2015-12-04 DIAGNOSIS — F32A Depression, unspecified: Secondary | ICD-10-CM

## 2015-12-04 DIAGNOSIS — F329 Major depressive disorder, single episode, unspecified: Secondary | ICD-10-CM

## 2015-12-04 MED ORDER — ASPIRIN 325 MG PO TABS
325.0000 mg | ORAL_TABLET | Freq: Once | ORAL | Status: AC
  Administered 2015-12-04: 325 mg via ORAL

## 2015-12-04 MED ORDER — OMEPRAZOLE 40 MG PO CPDR: 40.0000 mg | DELAYED_RELEASE_CAPSULE | Freq: Every day | ORAL | Status: DC

## 2015-12-04 MED ORDER — MORPHINE SULFATE 30 MG PO TABS: ORAL_TABLET | ORAL | Status: DC

## 2015-12-04 MED ORDER — ASPIRIN 325 MG PO TABS: 325.0000 mg | ORAL_TABLET | Freq: Every day | ORAL | Status: DC

## 2015-12-04 MED ORDER — NITROGLYCERIN 0.4 MG SL SUBL: 0.4000 mg | SUBLINGUAL_TABLET | SUBLINGUAL | Status: DC | PRN

## 2015-12-04 MED ORDER — NITROGLYCERIN 0.4 MG SL SUBL
0.4000 mg | SUBLINGUAL_TABLET | Freq: Once | SUBLINGUAL | Status: AC
  Administered 2015-12-04: 0.4 mg via SUBLINGUAL

## 2015-12-04 MED ORDER — BUPROPION HCL ER (XL) 300 MG PO TB24: 300.0000 mg | ORAL_TABLET | ORAL | Status: DC

## 2015-12-04 NOTE — Assessment & Plan Note (Signed)
Continues to have neck pain, radicular pain on both arms with numbness/tingling, upper and lower back pain and right shoulder pain. Using MS 30mg   1/2 to 1 tablets  q4hr prn - #120 monthly. Has one more script left. Refilled her for 3 month more so this should last until August 10th, 2017.

## 2015-12-04 NOTE — Patient Instructions (Signed)
You are leaving against our medical advice. Our recommendation was going to the Emergency deparment or even direct admission under our team for chest pain evaluation.   Refilled your pain medication and other meds.  Please come back to the emergency room if you are having chest pain again.   Follow up in 3 months.

## 2015-12-04 NOTE — Assessment & Plan Note (Signed)
Refilled omeprazole 40 mg daily. 

## 2015-12-04 NOTE — Assessment & Plan Note (Addendum)
Patient having active chest pain today started while we are discussing her ED visit for previous chest pain episode on 11/23/15. Normal EKG and troponin (1x POC) at that time. She did not stay for full evaluation, left AMA.  She had pain recurring again when she was talking to me today. It was substernal. Gave her full dose aspirin and SL nitro with some relief of the pain. Obtained EKG in the clinic which did not show any signs of ischemia. Told patient that she will need to go to the ED for active chest pain for further evaluation including serial troponins. Called the ED to notify them. However, patient decided to leave AMA and not be admitted or go to the ED. She stated she will come back if this happens again despite having long discussion explaining why it's important to observe her for chest pain.   This could be related to her right shoulder where she had rotator cuff tear s/p surgery, also could be from her cervical disc disease, or GERD. However, ACS needs to be ruled out as she is 50 years old.  Patient left AMA.

## 2015-12-04 NOTE — Assessment & Plan Note (Signed)
Doing well on wellbutrin 300mg  daily. Refilled this.

## 2015-12-04 NOTE — Progress Notes (Signed)
Subjective:    Patient ID: Julie Williams, female    DOB: 1966/03/13, 50 y.o.   MRN: [id number]  HPI  50 yo female with chronic pain on chronic narcotic medication, depression, HTN, here for ED follow up. Also requesting refill for her GERD meds and pain medications.    Went to the ED for chest pain on 11/23/15. EKG was unremarkable and POC troponin was negative. Patient left before she could be evaluated fully as she did not want to wait so left. That time pain started at the [facility] parking lot when she closed her car door with her right arm. Pain was on the right scapula radiating to her chest.  She did not have chest pain since then. As we were discussing the visit, she started having chest pain again. This time it was substernal, sharp pain. Gave her full dose aspirin and SL nitro with some relief of the pain. Obtained EKG in the clinic. Told patient that she will need to go to the ED for active chest pain for further evaluation including serial troponins. Called the ED to notify them. After EKG obtained, it was normal. She stated she does not want to go to the ED or even be directly admitted by our team for chest pain rule out. She stated if the pain recurs then she will come back. I had long discussion with her in terms of this can leading towards even death but patient still refused admission or to be sent to the ED.    Chronic pain - continues to have neck pain, right shoulder pain, and upper and lower back pain. Has been waiting to see Dr. Arnoldo Morale neurosurgery but their office has not been able to get her an appt yet.  currently on morphine 30mg  tablet   GERD - on omeprazole 40mg  daily. Wants refill.   Depression - on bupropion 300mg  daily. States she is doing well on this. Denies SI/HI.   Review of Systems  Constitutional: Negative for fever and chills.  HENT: Negative for congestion and sore throat.   Respiratory: Negative for chest tightness and shortness of breath.     Cardiovascular: Positive for chest pain. Negative for palpitations and leg swelling.  Gastrointestinal: Negative.   Endocrine: Negative.   Musculoskeletal: Positive for back pain, arthralgias and neck pain. Negative for joint swelling, gait problem and neck stiffness.  Skin: Negative.   Neurological: Negative for facial asymmetry.  Psychiatric/Behavioral: Negative for suicidal ideas and self-injury.       Objective:   Physical Exam  Constitutional: She is oriented to person, place, and time. No distress.  Initially she was in NAD but then started to appear anxious when she started having chest pain.   HENT:  Head: Normocephalic and atraumatic.  Eyes: EOM are normal.  Neck: Normal range of motion.  Cardiovascular: Normal rate and regular rhythm.  Exam reveals no friction rub.   No murmur heard. Pulmonary/Chest: Effort normal and breath sounds normal. No respiratory distress. She has no rales.  Abdominal: Soft. Bowel sounds are normal. She exhibits no distension. There is no tenderness.  Musculoskeletal: Normal range of motion. She exhibits no edema or tenderness.  Neurological: She is alert and oriented to person, place, and time.  Psychiatric: Her speech is normal and behavior is normal. Thought content normal. She exhibits a depressed mood.    Filed Vitals:   12/04/15 1436  BP: 112/73  Pulse: 76  Temp: 98.1 F (36.7 C)  Assessment & Plan:  See problem based a&p.

## 2015-12-04 NOTE — Assessment & Plan Note (Signed)
Filed Vitals:   12/04/15 1436  BP: 112/73  Pulse: 76  Temp: 98.1 F (36.7 C)   BP was elevated last visit but normal today. Not any meds currently. Continue to monitor.

## 2015-12-04 NOTE — Assessment & Plan Note (Addendum)
She had known DJD of cervical spine causing neck pain and radicular pain to both arms with numbness/tingling. Was referred to Dr. Arnoldo Morale by [facility]. She has not been able to see them.  -we will put referral again for her to see Dr. Arnoldo Morale.

## 2015-12-20 NOTE — Progress Notes (Signed)
Internal Medicine Clinic Attending  Case discussed with Dr. Ahmed at the time of the visit.  We reviewed the resident's history and exam and pertinent patient test results.  I agree with the assessment, diagnosis, and plan of care documented in the resident's note. 

## 2015-12-25 DIAGNOSIS — Z9889 Other specified postprocedural states: Secondary | ICD-10-CM | POA: Diagnosis not present

## 2015-12-25 DIAGNOSIS — M19019 Primary osteoarthritis, unspecified shoulder: Secondary | ICD-10-CM

## 2015-12-25 DIAGNOSIS — M19011 Primary osteoarthritis, right shoulder: Secondary | ICD-10-CM | POA: Diagnosis not present

## 2015-12-25 HISTORY — DX: Primary osteoarthritis, unspecified shoulder: M19.019

## 2015-12-26 ENCOUNTER — Other Ambulatory Visit: Payer: Self-pay | Admitting: Internal Medicine

## 2015-12-26 NOTE — Telephone Encounter (Signed)
Last appointment 12/04/2015. Dosage requested 150 mg. Previous dose ordered 300 mg.

## 2016-01-18 ENCOUNTER — Telehealth: Payer: Self-pay | Admitting: *Deleted

## 2016-01-18 DIAGNOSIS — F1920 Other psychoactive substance dependence, uncomplicated: Secondary | ICD-10-CM

## 2016-01-18 MED ORDER — POLYETHYLENE GLYCOL 3350 17 GM/SCOOP PO POWD: 1.0000 | Freq: Once | ORAL | Status: DC

## 2016-01-18 NOTE — Telephone Encounter (Signed)
Contacted pt after receiving message from clinic staff that she was having some difficulty getting her pain medicine filled.  Pt answered but asked that I call back as she was on the other line with her insurance company.  Will call pt back later.Despina Hidden Cassady5/25/201711:58 AM

## 2016-01-18 NOTE — Telephone Encounter (Signed)
Call back to patient-she states issue is now resolved.  Pharmacy was incorrectly entering morphine rx as an extended release.  Pt is now requesting a rx for her opioid induced-constipation to be sent to [facility].  Will send to pcp for review. Please advise.Despina Hidden Cassady5/25/20173:16 PM

## 2016-01-18 NOTE — Telephone Encounter (Signed)
Sent miralax, has senokot already.

## 2016-01-19 ENCOUNTER — Other Ambulatory Visit: Payer: Self-pay

## 2016-01-19 ENCOUNTER — Telehealth: Payer: Self-pay

## 2016-01-19 DIAGNOSIS — F1920 Other psychoactive substance dependence, uncomplicated: Secondary | ICD-10-CM

## 2016-01-19 NOTE — Telephone Encounter (Signed)
Pt requesting the nurse to call back regarding constipation. Pt does not want the miralax. Please call pt back.

## 2016-01-19 NOTE — Telephone Encounter (Signed)
Jose from [place] requesting PA on Morphine.

## 2016-01-23 MED ORDER — NALOXEGOL OXALATE 25 MG PO TABS: 25.0000 mg | ORAL_TABLET | Freq: Every day | ORAL | Status: DC

## 2016-01-23 NOTE — Telephone Encounter (Signed)
Called patient to inform it was sent. Patient had called insurance company and verified that it was going to be too expensive.

## 2016-01-23 NOTE — Telephone Encounter (Signed)
LVM to return call.

## 2016-01-23 NOTE — Telephone Encounter (Signed)
I can put it in but not sure if she will be able afford that.

## 2016-01-23 NOTE — Telephone Encounter (Signed)
Spoke with patient who states that the Miralax prescription is not what she was asking for and that she no longer takes the Senokot. She continues to take the Miralax reportedly 3 times per day some days. Patient is requesting to try Movantik for opioid induced constipation. Please advise. Thanks!

## 2016-01-23 NOTE — Telephone Encounter (Signed)
Pt requesting Julie Williams to call back.

## 2016-02-20 NOTE — Addendum Note (Signed)
Addended by: Dellia Nims on: 02/20/2016 02:18 PM   Modules accepted: Orders

## 2016-02-23 ENCOUNTER — Other Ambulatory Visit: Payer: Self-pay | Admitting: Internal Medicine

## 2016-02-29 ENCOUNTER — Other Ambulatory Visit: Payer: Self-pay | Admitting: Internal Medicine

## 2016-02-29 DIAGNOSIS — Z1231 Encounter for screening mammogram for malignant neoplasm of breast: Secondary | ICD-10-CM

## 2016-03-05 ENCOUNTER — Telehealth: Payer: Self-pay | Admitting: Internal Medicine

## 2016-03-05 NOTE — Telephone Encounter (Signed)
Spoke with patient about the F/u concerning her [facility] Ref.  Office did not receive all the pages of the the Referral faxed.  Reassured patient that it has now been refax again today with confirmation.  Also gave patient the phone number to [facility] of [phone] to f/u if she had not heard back from them in 1 -2 weeks.  Patient understood and says Thank You.

## 2016-03-05 NOTE — Telephone Encounter (Signed)
Patient called in reference to her Ref that was place on 02/20/2016 to [facility].  Patient states it has been delayed and wants an update.  Fraser Din is aware records have been faxed and we are waiting on an appt with their office.  Will F/u with Office on Appt.

## 2016-03-11 DIAGNOSIS — Z9889 Other specified postprocedural states: Secondary | ICD-10-CM | POA: Diagnosis not present

## 2016-03-14 ENCOUNTER — Ambulatory Visit
Admission: RE | Admit: 2016-03-14 | Discharge: 2016-03-14 | Disposition: A | Discharge status: Home / Self Care | Payer: PPO | Source: Ambulatory Visit | Attending: Cardiology | Admitting: Cardiology

## 2016-03-14 ENCOUNTER — Ambulatory Visit: Payer: PPO

## 2016-03-14 DIAGNOSIS — Z1231 Encounter for screening mammogram for malignant neoplasm of breast: Secondary | ICD-10-CM

## 2016-03-18 ENCOUNTER — Telehealth: Payer: Self-pay | Admitting: *Deleted

## 2016-03-18 NOTE — Telephone Encounter (Signed)
Pt calls and is very upset, she says she must see dr Genene Churn before 8/26 because she needs new narcotic prescriptions, she states he told her he would see her by that date, in last visit note it states a July visit is desired by dr Genene Churn but he had no appts available in July per front office, his next available appt is end of sept and pt has been scheduled for that pm, I have ask pt to call 1 week before her medication ends- 8/18 and ask for triage nurse, tell them to refer to this day's note and request her scripts, also told her that this note would be sent to dr Genene Churn Please advise

## 2016-03-18 NOTE — Telephone Encounter (Signed)
It's my fault that I gave her the 3 month supply without an appt available before running out. I agree with your plan to call before she runs out and I can refill it from [facility]. Please ask her to make appt with my earliest available appt time.

## 2016-03-19 NOTE — Telephone Encounter (Signed)
appt is 9/25 dr Genene Churn

## 2016-03-26 DIAGNOSIS — M542 Cervicalgia: Secondary | ICD-10-CM | POA: Diagnosis not present

## 2016-04-12 ENCOUNTER — Other Ambulatory Visit: Payer: PPO

## 2016-04-12 ENCOUNTER — Other Ambulatory Visit: Payer: Self-pay

## 2016-04-12 ENCOUNTER — Other Ambulatory Visit: Payer: Self-pay | Admitting: Internal Medicine

## 2016-04-12 DIAGNOSIS — F1920 Other psychoactive substance dependence, uncomplicated: Secondary | ICD-10-CM

## 2016-04-12 MED ORDER — MORPHINE SULFATE 30 MG PO TABS: ORAL_TABLET | ORAL | 0 refills | Status: DC

## 2016-04-12 NOTE — Telephone Encounter (Signed)
Patient came to clinic to discuss refill.

## 2016-04-12 NOTE — Telephone Encounter (Signed)
Needs to speak with a nurse.  

## 2016-04-12 NOTE — Progress Notes (Signed)
Checked last note, UDS, future appt, and med dose

## 2016-04-17 ENCOUNTER — Other Ambulatory Visit: Payer: Self-pay | Admitting: Internal Medicine

## 2016-04-17 DIAGNOSIS — K219 Gastro-esophageal reflux disease without esophagitis: Secondary | ICD-10-CM

## 2016-05-10 ENCOUNTER — Other Ambulatory Visit: Payer: Self-pay | Admitting: Internal Medicine

## 2016-05-10 DIAGNOSIS — F32A Depression, unspecified: Secondary | ICD-10-CM

## 2016-05-10 DIAGNOSIS — F329 Major depressive disorder, single episode, unspecified: Secondary | ICD-10-CM

## 2016-05-17 ENCOUNTER — Telehealth: Payer: Self-pay | Admitting: Internal Medicine

## 2016-05-17 NOTE — Telephone Encounter (Signed)
APT. REMINDER CALL, LMTCB °

## 2016-05-20 ENCOUNTER — Ambulatory Visit (INDEPENDENT_AMBULATORY_CARE_PROVIDER_SITE_OTHER): Payer: PPO | Admitting: Internal Medicine

## 2016-05-20 ENCOUNTER — Encounter: Payer: Self-pay | Admitting: Internal Medicine

## 2016-05-20 VITALS — BP 134/97 | HR 62 | Temp 98.2°F | Ht 65.0 in | Wt 145.7 lb

## 2016-05-20 DIAGNOSIS — Z23 Encounter for immunization: Secondary | ICD-10-CM

## 2016-05-20 DIAGNOSIS — Z0000 Encounter for general adult medical examination without abnormal findings: Secondary | ICD-10-CM

## 2016-05-20 DIAGNOSIS — F1920 Other psychoactive substance dependence, uncomplicated: Secondary | ICD-10-CM

## 2016-05-20 DIAGNOSIS — M542 Cervicalgia: Secondary | ICD-10-CM | POA: Diagnosis not present

## 2016-05-20 DIAGNOSIS — F17210 Nicotine dependence, cigarettes, uncomplicated: Secondary | ICD-10-CM

## 2016-05-20 DIAGNOSIS — I10 Essential (primary) hypertension: Secondary | ICD-10-CM

## 2016-05-20 DIAGNOSIS — Z79891 Long term (current) use of opiate analgesic: Secondary | ICD-10-CM

## 2016-05-20 DIAGNOSIS — Z114 Encounter for screening for human immunodeficiency virus [HIV]: Secondary | ICD-10-CM | POA: Diagnosis not present

## 2016-05-20 DIAGNOSIS — R221 Localized swelling, mass and lump, neck: Secondary | ICD-10-CM

## 2016-05-20 DIAGNOSIS — G8929 Other chronic pain: Secondary | ICD-10-CM | POA: Diagnosis not present

## 2016-05-20 DIAGNOSIS — R220 Localized swelling, mass and lump, head: Secondary | ICD-10-CM | POA: Insufficient documentation

## 2016-05-20 DIAGNOSIS — Z01810 Encounter for preprocedural cardiovascular examination: Secondary | ICD-10-CM | POA: Insufficient documentation

## 2016-05-20 HISTORY — DX: Encounter for general adult medical examination without abnormal findings: Z00.00

## 2016-05-20 MED ORDER — MORPHINE SULFATE 30 MG PO TABS: ORAL_TABLET | ORAL | 0 refills | Status: DC

## 2016-05-20 NOTE — Assessment & Plan Note (Signed)
Ordered HIV ab.

## 2016-05-20 NOTE — Progress Notes (Signed)
Internal Medicine Clinic Attending  Case discussed with Dr. Ahmed at the time of the visit.  We reviewed the resident's history and exam and pertinent patient test results.  I agree with the assessment, diagnosis, and plan of care documented in the resident's note. 

## 2016-05-20 NOTE — Assessment & Plan Note (Signed)
Vitals:   05/20/16 1436  BP: (!) 134/97  Pulse: 62  Temp: 98.2 F (Q000111Q C)   BP diastolic is higher today. Not on meds. Will monitor without meds for now.

## 2016-05-20 NOTE — Patient Instructions (Addendum)
Ordered neck ultrasound.  Refilled prescription for 3 month total (should last you until Jan 25th, 2018).   Referred you for the colonoscopy and pap smear.  Follow up in 3 months.
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
## 2016-05-20 NOTE — Assessment & Plan Note (Signed)
Refilled 3 more months of MS IR. Should last until Jan 26th, 2018 (already had 1 script which she filled today). Will likely have to do pain management with [facility] long term as she is not a surgical candidate. No red flags.

## 2016-05-20 NOTE — Assessment & Plan Note (Signed)
Patient is concerned about throat cancer. I don't feel any mass on my exam or any thyroid enlargement. However, she is very bothered by it. Since she is a smoker, I think it is not unreasonable to get a soft tissue neck u/s to evaluate further. I will order this and do further workup based on the finding.

## 2016-05-20 NOTE — Assessment & Plan Note (Addendum)
Due for cscope. Wants to go to [facility] (not [place] [place]). Will refer her there.  Due for pap smear, wants to go the gyn for this. Will refer her.  Gave flu shot.

## 2016-05-21 LAB — HIV ANTIBODY (ROUTINE TESTING W REFLEX): HIV SCREEN 4TH GENERATION: NONREACTIVE

## 2016-05-24 ENCOUNTER — Ambulatory Visit (HOSPITAL_COMMUNITY)
Admission: RE | Admit: 2016-05-24 | Discharge: 2016-05-24 | Disposition: A | Discharge status: Home / Self Care | Payer: PPO | Source: Ambulatory Visit | Attending: Internal Medicine | Admitting: Internal Medicine

## 2016-05-24 ENCOUNTER — Other Ambulatory Visit: Payer: Self-pay | Admitting: Internal Medicine

## 2016-05-24 DIAGNOSIS — R221 Localized swelling, mass and lump, neck: Secondary | ICD-10-CM | POA: Insufficient documentation

## 2016-05-24 DIAGNOSIS — R220 Localized swelling, mass and lump, head: Secondary | ICD-10-CM

## 2016-05-28 ENCOUNTER — Telehealth: Payer: Self-pay | Admitting: Internal Medicine

## 2016-05-28 NOTE — Telephone Encounter (Signed)
I returned a call to Ms Shero informing her of the normal results of her neck ultrasound.  She is still very concerned about this neck pain and does not feel that it has been adequately addressed, she is especially concerned about her smoking history.  I informed her that Dr Genene Churn is currently out of the office and I would like her to be seen in the Acute Care clinic for this issue so that we may decide if she needs further imaging or referral.

## 2016-05-28 NOTE — Telephone Encounter (Signed)
Pt requesting U/S results

## 2016-05-29 NOTE — Telephone Encounter (Signed)
[place], could you please call pt and make appt in acc for her?

## 2016-05-30 ENCOUNTER — Ambulatory Visit (INDEPENDENT_AMBULATORY_CARE_PROVIDER_SITE_OTHER): Payer: PPO | Admitting: Internal Medicine

## 2016-05-30 DIAGNOSIS — Z79899 Other long term (current) drug therapy: Secondary | ICD-10-CM | POA: Diagnosis not present

## 2016-05-30 DIAGNOSIS — K210 Gastro-esophageal reflux disease with esophagitis: Secondary | ICD-10-CM | POA: Diagnosis not present

## 2016-05-30 DIAGNOSIS — R221 Localized swelling, mass and lump, neck: Secondary | ICD-10-CM | POA: Diagnosis not present

## 2016-05-30 DIAGNOSIS — R070 Pain in throat: Secondary | ICD-10-CM

## 2016-05-30 DIAGNOSIS — Z87891 Personal history of nicotine dependence: Secondary | ICD-10-CM | POA: Diagnosis not present

## 2016-05-30 HISTORY — DX: Pain in throat: R07.0

## 2016-05-30 MED ORDER — POLYETHYLENE GLYCOL 3350 17 GM/SCOOP PO POWD: ORAL | 2 refills | Status: DC
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
## 2016-05-30 NOTE — Patient Instructions (Signed)
It was a pleasure to meet you [name].  The U/S of your neck was normal. I will refer you to an Ear, Nose, Throat specialist to help [place] evaluate what is causing your symptoms.  I have refilled your miralax.

## 2016-05-30 NOTE — Progress Notes (Signed)
Medicine attending: Medical history, presenting problems, physical findings, and medications, reviewed with resident physician Dr Vishal Patel on the day of the patient visit and I concur with his evaluation and management plan. 

## 2016-05-30 NOTE — Assessment & Plan Note (Signed)
Patient states she has felt a midline neck mass beginning about 1.5 years ago. She says it causes her to have frequent throat clearing bringing up clear/white sputum. She does not have any difficulty swallowing solids or liquids. She has a history of heartburn which is controlled with Omeprazole and not similar to her current symptoms. She has seen ENT in 2014 who diagnosed her reflux esophagitis and saw tonsil stones. She states that she occasionally does see small white flecks in her tonsils which she is able to remove on her own. She is a former smoker of ~0.5 PPD for 36 years. She quit smoking 3 years ago.  An U/S of soft tissue head/neck was normal on 05/24/16. Has tried antihistamines and flonase without relief.  No observable or palpable mass on exam and U/S negative as well. Will refer back to ENT for possible laryngoscopy. -ENT referral

## 2016-05-31 LAB — TOXASSURE SELECT,+ANTIDEPR,UR

## 2016-06-04 ENCOUNTER — Telehealth: Payer: Self-pay | Admitting: Gastroenterology

## 2016-06-04 NOTE — Telephone Encounter (Signed)
Received colon/path reports from [facility] and placed on Dr. Doyne Keel desk for review. Dr. Havery Moros is Doc of the Day.

## 2016-06-13 DIAGNOSIS — J305 Allergic rhinitis due to food: Secondary | ICD-10-CM | POA: Diagnosis not present

## 2016-06-13 DIAGNOSIS — J3081 Allergic rhinitis due to animal (cat) (dog) hair and dander: Secondary | ICD-10-CM | POA: Diagnosis not present

## 2016-06-13 DIAGNOSIS — J320 Chronic maxillary sinusitis: Secondary | ICD-10-CM | POA: Diagnosis not present

## 2016-06-13 DIAGNOSIS — J039 Acute tonsillitis, unspecified: Secondary | ICD-10-CM | POA: Diagnosis not present

## 2016-06-13 DIAGNOSIS — J301 Allergic rhinitis due to pollen: Secondary | ICD-10-CM | POA: Diagnosis not present

## 2016-06-13 DIAGNOSIS — J322 Chronic ethmoidal sinusitis: Secondary | ICD-10-CM | POA: Diagnosis not present

## 2016-06-13 DIAGNOSIS — J370 Chronic laryngitis: Secondary | ICD-10-CM | POA: Diagnosis not present

## 2016-06-13 DIAGNOSIS — J342 Deviated nasal septum: Secondary | ICD-10-CM | POA: Diagnosis not present

## 2016-06-17 ENCOUNTER — Encounter: Payer: Self-pay | Admitting: Gastroenterology

## 2016-06-17 NOTE — Telephone Encounter (Signed)
Dr. Havery Moros reviewed records and has accepted patient. Ok to schedule Direct Colonoscopy. Left message for patient to return my call.

## 2016-06-20 ENCOUNTER — Other Ambulatory Visit: Payer: Self-pay | Admitting: Otolaryngology

## 2016-06-20 ENCOUNTER — Ambulatory Visit
Admission: RE | Admit: 2016-06-20 | Discharge: 2016-06-20 | Disposition: A | Discharge status: Home / Self Care | Payer: PPO | Source: Ambulatory Visit | Attending: Otolaryngology | Admitting: Otolaryngology

## 2016-06-20 DIAGNOSIS — J410 Simple chronic bronchitis: Secondary | ICD-10-CM | POA: Diagnosis not present

## 2016-06-20 DIAGNOSIS — R059 Cough, unspecified: Secondary | ICD-10-CM

## 2016-06-20 DIAGNOSIS — J40 Bronchitis, not specified as acute or chronic: Secondary | ICD-10-CM

## 2016-06-20 DIAGNOSIS — J040 Acute laryngitis: Secondary | ICD-10-CM | POA: Diagnosis not present

## 2016-06-20 DIAGNOSIS — R05 Cough: Secondary | ICD-10-CM

## 2016-06-20 DIAGNOSIS — R490 Dysphonia: Secondary | ICD-10-CM | POA: Diagnosis not present

## 2016-06-20 DIAGNOSIS — J301 Allergic rhinitis due to pollen: Secondary | ICD-10-CM | POA: Diagnosis not present

## 2016-07-05 ENCOUNTER — Other Ambulatory Visit (HOSPITAL_COMMUNITY)
Admission: RE | Admit: 2016-07-05 | Discharge: 2016-07-05 | Disposition: A | Discharge status: Home / Self Care | Payer: PPO | Source: Ambulatory Visit | Attending: Obstetrics and Gynecology | Admitting: Obstetrics and Gynecology

## 2016-07-05 ENCOUNTER — Encounter: Payer: Self-pay | Admitting: Obstetrics and Gynecology

## 2016-07-05 ENCOUNTER — Ambulatory Visit (INDEPENDENT_AMBULATORY_CARE_PROVIDER_SITE_OTHER): Payer: PPO | Admitting: Obstetrics and Gynecology

## 2016-07-05 VITALS — BP 140/97 | HR 80 | Wt 147.9 lb

## 2016-07-05 DIAGNOSIS — Z01411 Encounter for gynecological examination (general) (routine) with abnormal findings: Secondary | ICD-10-CM

## 2016-07-05 DIAGNOSIS — Z1151 Encounter for screening for human papillomavirus (HPV): Secondary | ICD-10-CM | POA: Insufficient documentation

## 2016-07-05 DIAGNOSIS — Z01419 Encounter for gynecological examination (general) (routine) without abnormal findings: Secondary | ICD-10-CM | POA: Insufficient documentation

## 2016-07-05 DIAGNOSIS — N952 Postmenopausal atrophic vaginitis: Secondary | ICD-10-CM | POA: Insufficient documentation

## 2016-07-05 DIAGNOSIS — Z124 Encounter for screening for malignant neoplasm of cervix: Secondary | ICD-10-CM | POA: Diagnosis not present

## 2016-07-05 DIAGNOSIS — N951 Menopausal and female climacteric states: Secondary | ICD-10-CM | POA: Insufficient documentation

## 2016-07-05 HISTORY — DX: Postmenopausal atrophic vaginitis: N95.2

## 2016-07-05 NOTE — Progress Notes (Signed)
Subjective:     Julie Williams is a 50 y.o. female here for a routine exam. She reports problems with menopausal Sx. She is not sexual active. PCP managing medical problems. Mammogram this past July, was normal. LMP was several years ago.   Gynecologic History No LMP recorded. Patient is postmenopausal. Contraception: abstinence Last Pap: 2012. Results were: normal Last mammogram: 02/2016. Results were: normal  Obstetric History OB History  Gravida Para Term Preterm AB Living  1         1  SAB TAB Ectopic Multiple Live Births               # Outcome Date GA Lbr Len/2nd Weight Sex Delivery Anes PTL Lv  1 Gravida                The following portions of the patient's history were reviewed and updated as appropriate: allergies, current medications, past family history, past medical history, past social history and past surgical history.  Review of Systems Pertinent items are noted in HPI.    Objective:    BP (!) 140/97   Pulse 80   Wt 147 lb 14.4 oz (67.1 kg)   BMI 24.61 kg/m   General Appearance:    Alert, cooperative, no distress, appears stated age  Head:    Normocephalic, without obvious abnormality, atraumatic  Eyes:    PERRL, conjunctiva/corneas clear, EOM's intact, fundi    benign, both eyes  Ears:    Normal TM's and external ear canals, both ears  Nose:   Nares normal, septum midline, mucosa normal, no drainage    or sinus tenderness  Throat:   Lips, mucosa, and tongue normal; teeth and gums normal  Neck:   Supple, symmetrical, trachea midline, no adenopathy;    thyroid:  no enlargement/tenderness/nodules; no carotid   bruit or JVD  Back:     Symmetric, no curvature, ROM normal, no CVA tenderness  Lungs:     Clear to auscultation bilaterally, respirations unlabored  Chest Wall:    No tenderness or deformity   Heart:    Regular rate and rhythm, S1 and S2 normal, no murmur, rub   or gallop  Breast Exam:    Pt declined  Abdomen:     Soft, non-tender, bowel sounds  active all four quadrants,    no masses, no organomegaly  Genitalia:    NL EGBUS, vaginal atrophy, cervix no lesions, pt declined bimanual exam d/t to discomfort  Rectal:    Deferred  Extremities:   Extremities normal, atraumatic, no cyanosis or edema  Pulses:   2+ and symmetric all extremities  Skin:   Skin color, texture, turgor normal, no rashes or lesions  Lymph nodes:   Cervical, supraclavicular, and axillary nodes normal  Neurologic:   CNII-XII intact, normal strength, sensation and reflexes    throughout       Assessment:    Healthy female exam.   Vaginal atrophy  Menopausal Sx Plan:   Discussed treatment options for pt's menopausal and vaginal atrophy Sx. Pt declines at present. Pt instructed on Calcium and Vitamin D supplement. Advised to follow up with PCP in regards to increased BP reading as well.

## 2016-07-08 ENCOUNTER — Encounter: Payer: Self-pay | Admitting: Internal Medicine

## 2016-07-08 ENCOUNTER — Ambulatory Visit (INDEPENDENT_AMBULATORY_CARE_PROVIDER_SITE_OTHER): Payer: PPO | Admitting: Internal Medicine

## 2016-07-08 ENCOUNTER — Other Ambulatory Visit (INDEPENDENT_AMBULATORY_CARE_PROVIDER_SITE_OTHER): Payer: PPO

## 2016-07-08 VITALS — BP 126/84 | HR 75 | Ht 65.0 in | Wt 146.0 lb

## 2016-07-08 DIAGNOSIS — R058 Other specified cough: Secondary | ICD-10-CM

## 2016-07-08 DIAGNOSIS — R05 Cough: Secondary | ICD-10-CM | POA: Insufficient documentation

## 2016-07-08 LAB — CBC WITH DIFFERENTIAL/PLATELET
BASOS ABS: 0 10*3/uL (ref 0.0–0.1)
BASOS PCT: 0.5 % (ref 0.0–3.0)
EOS PCT: 1.7 % (ref 0.0–5.0)
Eosinophils Absolute: 0.1 10*3/uL (ref 0.0–0.7)
HEMATOCRIT: 41.2 % (ref 36.0–46.0)
Hemoglobin: 13.9 g/dL (ref 12.0–15.0)
LYMPHS ABS: 3.5 10*3/uL (ref 0.7–4.0)
LYMPHS PCT: 44 % (ref 12.0–46.0)
MCHC: 33.9 g/dL (ref 30.0–36.0)
MCV: 94.1 fl (ref 78.0–100.0)
MONOS PCT: 8 % (ref 3.0–12.0)
Monocytes Absolute: 0.6 10*3/uL (ref 0.1–1.0)
NEUTROS ABS: 3.6 10*3/uL (ref 1.4–7.7)
NEUTROS PCT: 45.8 % (ref 43.0–77.0)
PLATELETS: 211 10*3/uL (ref 150.0–400.0)
RBC: 4.38 Mil/uL (ref 3.87–5.11)
RDW: 12 % (ref 11.5–15.5)
WBC: 7.9 10*3/uL (ref 4.0–10.5)

## 2016-07-08 MED ORDER — PANTOPRAZOLE SODIUM 40 MG PO TBEC: DELAYED_RELEASE_TABLET | ORAL | 2 refills | Status: DC

## 2016-07-08 MED ORDER — GABAPENTIN 100 MG PO CAPS: 100.0000 mg | ORAL_CAPSULE | Freq: Three times a day (TID) | ORAL | 2 refills | Status: DC

## 2016-07-08 NOTE — Patient Instructions (Addendum)
Upper airway cough syndrome - irritable larynx syndrome - [name]   Pantoprazole (protonix) 40 mg    Take 30- 60 min before your first and last meals of the day   GERD (REFLUX)  is an extremely common cause of respiratory symptoms just like yours , many times with no obvious heartburn at all.    It can be treated with medication, but also with lifestyle changes including elevation of the head of your bed (ideally with 6 inch  bed blocks),  Smoking cessation, avoidance of late meals, excessive alcohol, and avoid fatty foods, chocolate, peppermint, colas, red wine, and acidic juices such as orange juice.  NO MINT OR MENTHOL PRODUCTS SO NO COUGH DROPS   USE SUGARLESS CANDY INSTEAD (Jolley ranchers or Stover's or Life Savers) or even ice chips will also do - the key is to swallow to prevent all throat clearing. NO OIL BASED VITAMINS - use powdered substitutes.  For drainage / throat tickle try take CHLORPHENIRAMINE  4 mg - take one every 4 hours as needed - available over the counter- may cause drowsiness so start with just a bedtime dose or two and see how you tolerate it before trying in daytime         If not satisfied after 2 weeks  the next stepis to Add on(in addition to above)  gabapentin 100 mg three times a day with meals before returning here.    Please schedule a follow up office visit in 4 weeks, sooner if needed

## 2016-07-08 NOTE — Progress Notes (Addendum)
Subjective:    Patient ID: Julie Williams, female    DOB: Oct 02, 1965,    MRN: [id number]  HPI  50 yowf quit smoking 2014 s/p 3 cervical disc at [facility] in 2012 and new sensation of globus x early 2014 referred to pulmonary clinic 07/08/2016 by Dr   Julie Williams.   07/08/2016 1st [facility] office visit/ Julie Williams   Chief Complaint  Patient presents with  . Pulmonary Consult    Referred by Dr. Ernesto Williams. Pt c/o throat clearing for the past 1 to 2 yrs. She states she will occ produce some sputum that is clear. She states "nothing helps and nothing makes it worse".     onset was abrupt x 09/2012 typically worse around 6pm with constant urge while awake to clear her throat with no excess/ purulent sputum or mucus plugs  And only present while awake with 2 neg ENT evals Julie Williams at [facility] and Dr Julie Williams  Neg resp to allegra, uses lots of cough drops and drinks a lot of tea and this helps more than anything else to date   No obvious other patterns in day to day or daytime variabilty or assoc sob  or cp or chest tightness, subjective wheeze overt sinus or hb symptoms. No unusual exp hx or h/o childhood pna/ asthma or knowledge of premature birth.  Sleeping ok without nocturnal  or early am exacerbation  of respiratory  c/o's or need for noct saba. Also denies any obvious fluctuation of symptoms with weather or environmental changes or other aggravating or alleviating factors except as outlined above   Current Medications, Allergies, Complete Past Medical History, Past Surgical History, Family History, and Social History were reviewed in [employer] record.            Review of Systems  Constitutional: Negative for chills, fever and unexpected weight change.  HENT: Negative for congestion, dental problem, ear pain, nosebleeds, postnasal drip, rhinorrhea, sinus pressure, sneezing, sore throat, trouble swallowing and voice change.   Eyes: Negative for visual disturbance.    Respiratory: Positive for cough. Negative for choking and shortness of breath.   Cardiovascular: Negative for chest pain and leg swelling.  Gastrointestinal: Negative for abdominal pain, diarrhea and vomiting.  Genitourinary: Negative for difficulty urinating.  Musculoskeletal: Negative for arthralgias.  Skin: Negative for rash.  Neurological: Negative for tremors, syncope and headaches.  Hematological: Does not bruise/bleed easily.       Objective:   Physical Exam  Hoarse amb wf who failed to answer a   questions  asked in a straightforward manner, tending to go off on tangents or answer questions with ambiguous medical terms or diagnoses and seemed aggravated  when asked the same question more than once for clarification.   Wt Readings from Last 3 Encounters:  07/08/16 146 lb (66.2 kg)  07/05/16 147 lb 14.4 oz (67.1 kg)  05/30/16 146 lb (66.2 kg)    Vital signs reviewed   HEENT: nl dentition, turbinates, and oropharynx. Nl external ear canals without cough reflex   NECK :  without JVD/Nodes/TM/ nl carotid upstrokes bilaterally   LUNGS: no acc muscle use,  Nl contour chest which is clear to A and P bilaterally without cough on insp or exp maneuvers   CV:  RRR  no s3 or murmur or increase in P2, no edema   ABD:  soft and nontender with nl inspiratory excursion in the supine position. No bruits or organomegaly, bowel sounds nl  MS:  Nl gait/  ext warm without deformities, calf tenderness, cyanosis or clubbing No obvious joint restrictions   SKIN: warm and dry without lesions  Other than scar from cervical neck surgery  NEURO:  alert, approp, nl sensorium with  no motor deficits       I personally reviewed images and agree with radiology impression as follows:  CXR:   06/20/16  Mediastinum hilar structures normal the lungs are clear. No pleural effusion pneumothorax. Heart size normal. Prior cervical spine fusion. Metallic density noted over the proximal right  humerus.      Assessment & Plan:

## 2016-07-09 ENCOUNTER — Encounter: Payer: Self-pay | Admitting: Internal Medicine

## 2016-07-09 LAB — RESPIRATORY ALLERGY PROFILE REGION II ~~LOC~~
Allergen, A. alternata, m6: <0.1 kU/L
Allergen, C. Herbarum, M2: <0.1 kU/L
Allergen, Comm Silver Birch, t9: <0.1 kU/L
Allergen, Cottonwood, t14: <0.1 kU/L
Allergen, Mulberry, t76: <0.1 kU/L
Allergen, P. notatum, m1: <0.1 kU/L
Aspergillus fumigatus, m3: <0.1 kU/L
Bermuda Grass: <0.1 kU/L
Cockroach: 1.03 kU/L — ABNORMAL HIGH
Common Ragweed: <0.1 kU/L
IgE (Immunoglobulin E), Serum: 187 kU/L — ABNORMAL HIGH (ref <115)
Timothy Grass: <0.1 kU/L

## 2016-07-09 LAB — CYTOLOGY - PAP
DIAGNOSIS: NEGATIVE
HPV (WINDOPATH): NOT DETECTED

## 2016-07-09 NOTE — Assessment & Plan Note (Signed)
Allergy profile 07/08/2016 >  Eos 0.1 /  IgE  >> - trial of H1 and max gerd rx x 2 weeks then add gabapentin   Interestingly onset was 2 years p cervical neck surgery and about the same time she quit smoking with almost entirely daytime (rare nocturnal) "constant" urge to clear throat.   This is Upper airway cough syndrome (previously labeled PNDS) , is  so named because it's frequently impossible to sort out how much is  CR/sinusitis with freq throat clearing (which can be related to primary GERD)   vs  causing  secondary (" extra esophageal")  GERD from wide swings in gastric pressure that occur with throat clearing, often  promoting self use of mint and menthol lozenges that reduce the lower esophageal sphincter tone and exacerbate the problem further in a cyclical fashion.   These are the same pts (now being labeled as having "irritable larynx syndrome" by some cough centers) who not infrequently have a history of having failed to tolerate ace inhibitors,  dry powder inhalers or biphosphonates or report having atypical/extraesophageal reflux symptoms that don't respond to standard doses of PPI  and are easily confused as having aecopd or asthma flares by even experienced allergists/ pulmonologists (myself included).   So rec she first try the simple measure of keeping hard rock candy in her mouth during the day and eliminating dietary contribution to non-acid gerd while maint max acid suppression and if not improving > start gabapentin then gradually increase to 300 qid if needed  Total time devoted to counseling  = 35/85m review case with pt(including extensive notes in care everywhere and Dr Berle Mull faxed records)  discussion of options/alternatives/ personally creating written instructions  in presence of pt  then going over those specific  Instructions directly with the pt including how to use all of the meds but in particular covering each new medication in detail and the difference between the  maintenance/automatic meds and the prns using an action plan format for the latter.

## 2016-07-10 ENCOUNTER — Telehealth: Payer: Self-pay | Admitting: Internal Medicine

## 2016-07-10 NOTE — Telephone Encounter (Signed)
Notes Recorded by Rosana Berger, CMA on 07/10/2016 at 12:15 PM EST LMTCB ------  Notes Recorded by Tanda Rockers, MD on 07/10/2016 at 11:16 AM EST Call patient : Studies are c/w moderate allergies but only identified trigger = cockroaches > obviously should min exposure, be sure has f/u ov   Pt aware of results and has appt 08-05-16 for follow up. Nothing more needed at this time.

## 2016-07-10 NOTE — Progress Notes (Signed)
LMTCB

## 2016-08-05 ENCOUNTER — Encounter: Payer: Self-pay | Admitting: Internal Medicine

## 2016-08-05 ENCOUNTER — Ambulatory Visit (INDEPENDENT_AMBULATORY_CARE_PROVIDER_SITE_OTHER): Payer: PPO | Admitting: Internal Medicine

## 2016-08-05 VITALS — BP 110/76 | HR 78 | Ht 65.0 in | Wt 149.4 lb

## 2016-08-05 DIAGNOSIS — R058 Other specified cough: Secondary | ICD-10-CM

## 2016-08-05 DIAGNOSIS — R05 Cough: Secondary | ICD-10-CM | POA: Diagnosis not present

## 2016-08-05 LAB — NITRIC OXIDE: Nitric Oxide: 19

## 2016-08-05 MED ORDER — PANTOPRAZOLE SODIUM 40 MG PO TBEC: DELAYED_RELEASE_TABLET | ORAL | 2 refills | Status: DC

## 2016-08-05 MED ORDER — PREDNISONE 10 MG PO TABS: ORAL_TABLET | ORAL | 0 refills | Status: DC

## 2016-08-05 MED ORDER — GABAPENTIN 300 MG PO CAPS: 300.0000 mg | ORAL_CAPSULE | Freq: Three times a day (TID) | ORAL | 2 refills | Status: DC

## 2016-08-05 NOTE — Patient Instructions (Addendum)
Increase neurontin to 300 mg three times a day  Protonix 40 mg Take 30- 60 min before your first and last meals of the day   Prednisone 10 mg take  4 each am x 2 days,   2 each am x 2 days,  1 each am x 2 days and stop   Please schedule a follow up office visit in 4 weeks, sooner if needed with all medications in hand

## 2016-08-05 NOTE — Progress Notes (Signed)
Subjective:    Patient ID: Julie Williams, female    DOB: 04-27-1966,    MRN: [id number]    Brief patient profile: f 51 yowf quit smoking 2014 s/p 3 cervical disc at [facility] in 2012 and new sensation of globus x early 2014 referred to pulmonary clinic 07/08/2016 by Dr   Julie Williams.   History of Present Illness  07/08/2016 1st [name] office visit/ Julie Williams   Chief Complaint  Patient presents with  . Pulmonary Consult    Referred by Dr. Ernesto Williams. Pt c/o throat clearing for the past 1 to 2 yrs. She states she will occ produce some sputum that is clear. She states "nothing helps and nothing makes it worse".     onset was abrupt x 09/2012 typically worse around 6pm with constant urge while awake to clear her throat with no excess/ purulent sputum or mucus plugs  And only present while awake with 2 neg ENT evals Julie Williams at [facility] and Dr Julie Williams  Neg resp to allegra, uses lots of cough drops and drinks a lot of tea and this helps more than anything else to date  rec Upper airway cough syndrome - irritable larynx syndrome - Julie Williams  Pantoprazole (protonix) 40 mg    Take 30- 60 min before your first and last meals of the day  GERD For drainage / throat tickle try take CHLORPHENIRAMINE  4 mg - take one every 4 hours as needed - Add on(in addition to above)  gabapentin 100 mg three times a day with meals before returning here.  Please schedule a follow up office visit in 4 weeks, sooner if needed      08/05/2016  f/u ov/Julie Williams re:  Irritable larynx /uacs on 100 mg tid on ppi one daily (instructions say bid ac)  Chief Complaint  Patient presents with  . Follow-up    Cough has improved slightly. No new co's.        Julie Williams Reflux v Neurogenic Cough Differentiator Reflux Comments  Do you awaken from a sound sleep coughing violently?                            With trouble breathing? No    Do you have choking episodes when you cannot  Get enough air, gasping for air ?              no     Do you usually cough when you lie down into  The bed, or when you just lie down to rest ?                          no   Do you usually cough after meals or eating?         no   Do you cough when (or after) you bend over?    no   GERD SCORE     Julie Williams Reflux v Neurogenic Cough Differentiator Neurogenic   Do you more-or-less cough all day long? Yes    Does change of temperature make you cough? no   Does laughing or chuckling cause you to cough? no   Do fumes (perfume, automobile fumes, burned  Toast, etc.,) cause you to cough ?      no   Does speaking, singing, or talking on the phone cause you to cough   ?  No    Neurogenic/Airway score       Not limited by breathing from desired activities    No obvious day to day or daytime variability or assoc excess/ purulent sputum or mucus plugs or hemoptysis or cp or chest tightness, subjective wheeze or overt sinus or hb symptoms. No unusual exp hx or h/o childhood pna/ asthma or knowledge of premature birth.  Sleeping ok without nocturnal  or early am exacerbation  of respiratory  c/o's or need for noct saba. Also denies any obvious fluctuation of symptoms with weather or environmental changes or other aggravating or alleviating factors except as outlined above   Current Medications, Allergies, Complete Past Medical History, Past Surgical History, Family History, and Social History were reviewed in [employer] record.  ROS  The following are not active complaints unless bolded sore throat, dysphagia, dental problems, itching, sneezing,  nasal congestion or excess/ purulent secretions, ear ache,   fever, chills, sweats, unintended wt loss, classically pleuritic or exertional cp,  orthopnea pnd or leg swelling, presyncope, palpitations, abdominal pain, anorexia, nausea, vomiting, diarrhea  or change in bowel or bladder habits, change in stools or urine, dysuria,hematuria,  rash, arthralgias, visual complaints,  headache, numbness, weakness or ataxia or problems with walking or coordination,  change in mood/affect or memory.              Objective:   Physical Exam  Hoarse amb wf  freq throat clearing during interview and exam   08/05/2016        07/08/16 146 lb (66.2 kg)  07/05/16 147 lb 14.4 oz (67.1 kg)  05/30/16 146 lb (66.2 kg)    Vital signs reviewed   HEENT: nl dentition, turbinates, and oropharynx. Nl external ear canals without cough reflex   NECK :  without JVD/Nodes/TM/ nl carotid upstrokes bilaterally   LUNGS: no acc muscle use,  Nl contour chest which is clear to A and P bilaterally without cough on insp or exp maneuvers   CV:  RRR  no s3 or murmur or increase in P2, no edema   ABD:  soft and nontender with nl inspiratory excursion in the supine position. No bruits or organomegaly, bowel sounds nl  MS:  Nl gait/ ext warm without deformities, calf tenderness, cyanosis or clubbing No obvious joint restrictions   SKIN: warm and dry without lesions  Other than scar from cervical neck surgery  NEURO:  alert, approp, nl sensorium with  no motor deficits       I personally reviewed images and agree with radiology impression as follows:  CXR:   06/20/16  Mediastinum hilar structures normal the lungs are clear. No pleural effusion pneumothorax. Heart size normal. Prior cervical spine fusion. Metallic density noted over the proximal right humerus.      Assessment & Plan:

## 2016-08-06 NOTE — Assessment & Plan Note (Signed)
Allergy profile 07/08/2016 >  Eos 0.1 /  IgE  187 pos RAST cockroach   - 07/08/16 rec  trial of H1 and max gerd rx x 2 weeks then added gabapentin 100 tid late nov 2017 - cough min improved 08/05/2016 on 100 tid gabapentin and ppi qd so rec bid ppi and increase to 300 tid  - FENO 08/05/2016  =   17 on no steroids   In terms of exp to cockroach, pt not aware of any but states has not spent a week away from home since onset of cough.  Still strongly favor Upper airway cough syndrome (previously labeled PNDS) , is  so named because it's frequently impossible to sort out how much is  CR/sinusitis with freq throat clearing (which can be related to primary GERD)   vs  causing  secondary (" extra esophageal")  GERD from wide swings in gastric pressure that occur with throat clearing, often  promoting self use of mint and menthol lozenges that reduce the lower esophageal sphincter tone and exacerbate the problem further in a cyclical fashion.   These are the same pts (now being labeled as having "irritable larynx syndrome" by some cough centers) who not infrequently have a history of having failed to tolerate ace inhibitors,  dry powder inhalers or biphosphonates or report having atypical/extraesophageal reflux symptoms that don't respond to standard doses of PPI  and are easily confused as having aecopd or asthma flares by even experienced allergists/ pulmonologists (myself included).   rec max gerd/ gabapentin next then regroup in 4 weeks emphasizing with each step 100% compliance. The principal here is that until we are certain that the  patients are doing what we've asked  Them  to do, it makes no sense to ask them to do more - ie the trust but verify approach will work best here.    I had an extended discussion with the patient reviewing all relevant studies completed to date and  lasting 15 to 20 minutes of a 25 minute visit    Each maintenance medication was reviewed in detail including most  importantly the difference between maintenance and prns and under what circumstances the prns are to be triggered using an action plan format that is not reflected in the computer generated alphabetically organized AVS.    Please see AVS for unique instructions that I personally wrote and verbalized to the the pt in detail and then reviewed with pt  by my nurse highlighting any  changes in therapy recommended at today's visit to their plan of care.

## 2016-08-07 ENCOUNTER — Telehealth: Payer: Self-pay | Admitting: *Deleted

## 2016-08-07 ENCOUNTER — Ambulatory Visit (AMBULATORY_SURGERY_CENTER): Payer: Self-pay | Admitting: *Deleted

## 2016-08-07 VITALS — Ht 65.5 in | Wt 149.4 lb

## 2016-08-07 DIAGNOSIS — Z86010 Personal history of colonic polyps: Secondary | ICD-10-CM

## 2016-08-07 MED ORDER — NA SULFATE-K SULFATE-MG SULF 17.5-3.13-1.6 GM/177ML PO SOLN: 1.0000 | Freq: Once | ORAL | 0 refills | Status: AC

## 2016-08-07 NOTE — Telephone Encounter (Signed)
Dr Havery Moros: pt is scheduled for colonoscopy 12/27.  I saw her in PV today.  Pt does not want sedation for procedure.  She says that she had bad experience with a previous colonoscopy and is adamant about being awake for the upcoming colonoscopy. She said that she had colonoscopy at [facility] and was awake and able to drive herself home.  I reviewed that report (in EPIC) and MD suggested Propofol for next colonoscopy. She had 2 other colonoscopies at [facility].  You have reviewed those reports and given ok to schedule recall colon for hx of polyps.   Pt instructed that she would need an IV and a driver even if she did not have sedation.  I spent 1 hour with pt talking with her about moderate sedation vs deep sedation vs no sedation.  Pt was very belligerent and argumentative during PV about not having sedation.  Are you ok with pt coming in as direct colonoscopy or do you want to see her in office before scheduling colonoscopy?  Thanks, Juliann Pulse in [facility]

## 2016-08-07 NOTE — Progress Notes (Signed)
No allergies to eggs or soy. No problems with anesthesia.  Pt given Emmi instructions for colonoscopy  No oxygen use  No diet drug use  

## 2016-08-08 NOTE — Telephone Encounter (Signed)
Spoke with patient about sedation and what  Dr Havery Moros recommended. While discussing with pt I found in care everywhere a sedation report from 02-03-2012 that states pt received 150 mg propofol for her colon with robinol and lidocaine,   Informed  pt of this.   She insisted I print this out and place it on her chart for her colon, which I did. Pt insists she was awake during her colon even with propofol, but will have her report sent back to admitting with her chart for the 12-27 colon.    Explained again about the need for a care partner to which pt stated she knew as she was told 20-30 times at her PV on Wednesday and she would just  get someone off the street.  Encouraged pt to call with further concerns or questions, Marijean Niemann

## 2016-08-08 NOTE — Telephone Encounter (Signed)
LMTRC 1008 am Julie Williams

## 2016-08-08 NOTE — Telephone Encounter (Signed)
If she doesn't want any sedation I understand that, but I can't guarantee that she won't have discomfort and it could prolong her procedure due to that issue. That being said if she is adamant about it we can do it without sedation if she tolerates it. I agree she will need an IV and should have plans for a ride home in case she does need medication. I'm okay to have her directly booked for her procedure

## 2016-08-09 ENCOUNTER — Encounter: Payer: Self-pay | Admitting: Gastroenterology

## 2016-08-21 ENCOUNTER — Ambulatory Visit (AMBULATORY_SURGERY_CENTER): Payer: PPO | Admitting: Gastroenterology

## 2016-08-21 ENCOUNTER — Encounter: Payer: Self-pay | Admitting: Gastroenterology

## 2016-08-21 VITALS — BP 130/84 | HR 65 | Temp 99.3°F | Resp 12 | Ht 65.0 in | Wt 149.0 lb

## 2016-08-21 DIAGNOSIS — F329 Major depressive disorder, single episode, unspecified: Secondary | ICD-10-CM | POA: Diagnosis not present

## 2016-08-21 DIAGNOSIS — Z86010 Personal history of colonic polyps: Secondary | ICD-10-CM | POA: Diagnosis not present

## 2016-08-21 DIAGNOSIS — I209 Angina pectoris, unspecified: Secondary | ICD-10-CM | POA: Diagnosis not present

## 2016-08-21 DIAGNOSIS — D127 Benign neoplasm of rectosigmoid junction: Secondary | ICD-10-CM

## 2016-08-21 DIAGNOSIS — K635 Polyp of colon: Secondary | ICD-10-CM

## 2016-08-21 DIAGNOSIS — I10 Essential (primary) hypertension: Secondary | ICD-10-CM | POA: Diagnosis not present

## 2016-08-21 DIAGNOSIS — D123 Benign neoplasm of transverse colon: Secondary | ICD-10-CM

## 2016-08-21 DIAGNOSIS — D120 Benign neoplasm of cecum: Secondary | ICD-10-CM

## 2016-08-21 DIAGNOSIS — K219 Gastro-esophageal reflux disease without esophagitis: Secondary | ICD-10-CM | POA: Diagnosis not present

## 2016-08-21 MED ORDER — SODIUM CHLORIDE 0.9 % IV SOLN: 500.0000 mL | INTRAVENOUS | Status: DC

## 2016-08-21 NOTE — Progress Notes (Signed)
Light sedation attempted, patient extremely uncomfortable.  Progressed to deeper sedation

## 2016-08-21 NOTE — Op Note (Signed)
[place] Patient Name: Julie Williams Procedure Date: 08/21/2016 10:34 AM MRN: [id number] Endoscopist: Remo Lipps P. Armbruster MD, MD Age: 50 Referring MD:  Date of Birth: September 15, 1965 Gender: Female Account #: [id number] Procedure:                Colonoscopy Indications:              High risk colon cancer surveillance: Personal                            history of adenomatous polyps Medicines:                Monitored Anesthesia Care Procedure:                Pre-Anesthesia Assessment:                           - Prior to the procedure, a History and Physical                            was performed, and patient medications and                            allergies were reviewed. The patient's tolerance of                            previous anesthesia was also reviewed. The risks                            and benefits of the procedure and the sedation                            options and risks were discussed with the patient.                            All questions were answered, and informed consent                            was obtained. Prior Anticoagulants: The patient has                            taken aspirin, last dose was 1 day prior to                            procedure. ASA Grade Assessment: III - A patient                            with severe systemic disease. After reviewing the                            risks and benefits, the patient was deemed in                            satisfactory condition to undergo the procedure.  After obtaining informed consent, the colonoscope                            was passed under direct vision. Throughout the                            procedure, the patient's blood pressure, pulse, and                            oxygen saturations were monitored continuously. The                            Model PCF-H190L [id number]) scope was introduced                            through the anus and  advanced to the the cecum,                            identified by appendiceal orifice and ileocecal                            valve. The colonoscopy was performed without                            difficulty. The patient tolerated the procedure                            well. The quality of the bowel preparation was                            good. The ileocecal valve, appendiceal orifice, and                            rectum were photographed. Scope In: 11:09:36 AM Scope Out: 11:29:27 AM Scope Withdrawal Time: 0 hours 16 minutes 19 seconds  Total Procedure Duration: 0 hours 19 minutes 51 seconds  Findings:                 The perianal and digital rectal examinations were                            normal.                           Multiple medium-mouthed diverticula were found in                            the left colon. There was some restricted mobility                            of the colon in this area with some areas of                            narrowed lumen.  A diminutive polyp was found in the cecum. The                            polyp was sessile. The polyp was removed with a                            cold biopsy forceps. Resection and retrieval were                            complete.                           A 3 mm polyp was found in the transverse colon. The                            polyp was sessile. The polyp was removed with a                            cold biopsy forceps. Resection and retrieval were                            complete.                           A 3 mm polyp was found in the recto-sigmoid colon.                            The polyp was sessile. The polyp was removed with a                            cold biopsy forceps. Resection and retrieval were                            complete.                           A post polypectomy scar was found in the                            recto-sigmoid colon. The scar  tissue was healthy in                            appearance.                           The exam was otherwise without abnormality on                            direct and retroflexion views. Complications:            No immediate complications. Estimated blood loss:                            Minimal. Estimated Blood Loss:     Estimated blood loss was minimal. Impression:               -  Diverticulosis in the left colon.                           - One diminutive polyp in the cecum, removed with a                            cold biopsy forceps. Resected and retrieved.                           - One 3 mm polyp in the transverse colon, removed                            with a cold biopsy forceps. Resected and retrieved.                           - One 3 mm polyp at the recto-sigmoid colon,                            removed with a cold biopsy forceps. Resected and                            retrieved.                           - Post-polypectomy scar in the recto-sigmoid colon.                           - The examination was otherwise normal on direct                            and retroflexion views. Recommendation:           - Patient has a contact number available for                            emergencies. The signs and symptoms of potential                            delayed complications were discussed with the                            patient. Return to normal activities tomorrow.                            Written discharge instructions were provided to the                            patient.                           - Resume previous diet.                           - Continue present medications.                           -  Await pathology results.                           - Repeat colonoscopy is recommended for                            surveillance. The colonoscopy date will be                            determined after pathology results from today's                             exam become available for review. Remo Lipps P. Armbruster MD, MD 08/21/2016 11:34:31 AM This report has been signed electronically.

## 2016-08-21 NOTE — Patient Instructions (Signed)
YOU HAD AN ENDOSCOPIC PROCEDURE TODAY AT [facility]:   Refer to the procedure report that was given to you for any specific questions about what was found during the examination.  If the procedure report does not answer your questions, please call your gastroenterologist to clarify.  If you requested that your care partner not be given the details of your procedure findings, then the procedure report has been included in a sealed envelope for you to review at your convenience later.  YOU SHOULD EXPECT: Some feelings of bloating in the abdomen. Passage of more gas than usual.  Walking can help get rid of the air that was put into your GI tract during the procedure and reduce the bloating. If you had a lower endoscopy (such as a colonoscopy or flexible sigmoidoscopy) you may notice spotting of blood in your stool or on the toilet paper. If you underwent a bowel prep for your procedure, you may not have a normal bowel movement for a few days.  Please Note:  You might notice some irritation and congestion in your nose or some drainage.  This is from the oxygen used during your procedure.  There is no need for concern and it should clear up in a day or so.  SYMPTOMS TO REPORT IMMEDIATELY:   Following lower endoscopy (colonoscopy or flexible sigmoidoscopy):  Excessive amounts of blood in the stool  Significant tenderness or worsening of abdominal pains  Swelling of the abdomen that is new, acute  Fever of 100F or higher  For urgent or emergent issues, a gastroenterologist can be reached at any hour by calling [phone].   DIET:  We do recommend a small meal at first, but then you may proceed to your regular diet.  Drink plenty of fluids but you should avoid alcoholic beverages for 24 hours.  ACTIVITY:  You should plan to take it easy for the rest of today and you should NOT DRIVE or use heavy machinery until tomorrow (because of the sedation medicines used during the test).     FOLLOW UP: Our staff will call the number listed on your records the next business day following your procedure to check on you and address any questions or concerns that you may have regarding the information given to you following your procedure. If we do not reach you, we will leave a message.  However, if you are feeling well and you are not experiencing any problems, there is no need to return our call.  We will assume that you have returned to your regular daily activities without incident.  Polyps (handout given) High Fiber Diet (handout given) Diverticulosis (handout given) Await biopsy results to determined next repeat Colonoscopy   If any biopsies were taken you will be contacted by phone or by letter within the next 1-3 weeks.  Please call us at [phone] if you have not heard about the biopsies in 3 weeks.    SIGNATURES/CONFIDENTIALITY: You and/or your care partner have signed paperwork which will be entered into your electronic medical record.  These signatures attest to the fact that that the information above on your After Visit Summary has been reviewed and is understood.  Full responsibility of the confidentiality of this discharge information lies with you and/or your care-partner.

## 2016-08-21 NOTE — Progress Notes (Signed)
Called to room to assist during endoscopic procedure.  Patient ID and intended procedure confirmed with present staff. Received instructions for my participation in the procedure from the performing physician.  

## 2016-08-21 NOTE — Progress Notes (Signed)
To PACU, vss patent aw report to rn 

## 2016-08-22 ENCOUNTER — Telehealth: Payer: Self-pay

## 2016-08-22 NOTE — Telephone Encounter (Signed)
  Follow up Call-  Call back number 08/21/2016  Post procedure Call Back phone  # [phone]  Permission to leave phone message Yes  Some recent data might be hidden     Patient questions:  Do you have a fever, pain , or abdominal swelling? No. Pain Score  0 *  Have you tolerated food without any problems? Yes.    Have you been able to return to your normal activities? Yes.    Do you have any questions about your discharge instructions: Diet   No. Medications  No. Follow up visit  No.  Do you have questions or concerns about your Care? No.  Actions: * If pain score is 4 or above: No action needed, pain <4.

## 2016-08-28 ENCOUNTER — Other Ambulatory Visit: Payer: Self-pay | Admitting: Internal Medicine

## 2016-08-28 DIAGNOSIS — K219 Gastro-esophageal reflux disease without esophagitis: Secondary | ICD-10-CM

## 2016-08-28 DIAGNOSIS — F329 Major depressive disorder, single episode, unspecified: Secondary | ICD-10-CM

## 2016-08-28 DIAGNOSIS — F32A Depression, unspecified: Secondary | ICD-10-CM

## 2016-08-29 ENCOUNTER — Encounter: Payer: Self-pay | Admitting: Gastroenterology

## 2016-08-29 ENCOUNTER — Encounter: Payer: Self-pay | Admitting: Internal Medicine

## 2016-09-05 ENCOUNTER — Other Ambulatory Visit: Payer: Self-pay | Admitting: Internal Medicine

## 2016-09-05 MED ORDER — GABAPENTIN 300 MG PO CAPS: 300.0000 mg | ORAL_CAPSULE | Freq: Three times a day (TID) | ORAL | 0 refills | Status: DC

## 2016-09-09 ENCOUNTER — Encounter: Payer: Self-pay | Admitting: Internal Medicine

## 2016-09-09 ENCOUNTER — Ambulatory Visit (INDEPENDENT_AMBULATORY_CARE_PROVIDER_SITE_OTHER): Payer: PPO | Admitting: Internal Medicine

## 2016-09-09 ENCOUNTER — Telehealth: Payer: Self-pay | Admitting: Internal Medicine

## 2016-09-09 VITALS — BP 108/72 | HR 70 | Ht 65.5 in | Wt 149.0 lb

## 2016-09-09 DIAGNOSIS — R05 Cough: Secondary | ICD-10-CM

## 2016-09-09 DIAGNOSIS — R058 Other specified cough: Secondary | ICD-10-CM

## 2016-09-09 MED ORDER — AMITRIPTYLINE HCL 10 MG PO TABS: 10.0000 mg | ORAL_TABLET | Freq: Every day | ORAL | 2 refills | Status: DC

## 2016-09-09 NOTE — Patient Instructions (Addendum)
Elavil 10 mg at bedtime added to your present regimen which you should not change   GERD (REFLUX)  is an extremely common cause of respiratory symptoms just like yours , many times with no obvious heartburn at all.    It can be treated with medication, but also with lifestyle changes including elevation of the head of your bed (ideally with 6 inch  bed blocks),  Smoking cessation, avoidance of late meals, excessive alcohol, and avoid fatty foods, chocolate, peppermint, colas, red wine, and acidic juices such as orange juice.  NO MINT OR MENTHOL PRODUCTS SO NO COUGH DROPS  USE SUGARLESS CANDY INSTEAD (Jolley ranchers or Stover's or Life Savers) or even ice chips will also do - the key is to swallow to prevent all throat clearing. NO OIL BASED VITAMINS - use powdered substitutes.    Keep the candy handy and stop the tea and just sip water   Please see patient coordinator before you leave today  to schedule Dg Es   Please schedule a follow up office visit in 4 weeks, sooner if needed with all medications in hand

## 2016-09-09 NOTE — Telephone Encounter (Signed)
Noted - I did already discuss with her that as we progress thru the standard 12 step cough algorithm  that each step is more complicated and expenive

## 2016-09-09 NOTE — Progress Notes (Signed)
Subjective:    Patient ID: Julie Williams, female    DOB: [birth year],    MRN: [id number]    Brief patient profile: f 30 yowf quit smoking 2014 s/p  3rd cervical disc  at [facility] in 2012 and new sensation of globus x early 2014 referred to pulmonary clinic 07/08/2016 by Dr [name].   History of Present Illness  07/08/2016 1st [facility] office visit/ Kahlen Boyde   Chief Complaint  Patient presents with  . Pulmonary Consult    Referred by Dr. [name]. Pt c/o throat clearing for the past 1 to 2 yrs. She states she will occ produce some sputum that is clear. She states "nothing helps and nothing makes it worse".     onset was abrupt x 09/2012 typically worse around 6pm with constant urge while awake to clear her throat with no excess/ purulent sputum or mucus plugs  And only present while awake with 2 neg ENT evals [name] at [facility] and Dr [name]  Neg resp to allegra, uses lots of cough drops and drinks a lot of tea and this helps more than anything else to date  rec Upper airway cough syndrome - irritable larynx syndrome - [name]  Pantoprazole (protonix) 40 mg    Take 30- 60 min before your first and last meals of the day  GERD diet  For drainage / throat tickle try take CHLORPHENIRAMINE  4 mg - take one every 4 hours as needed - Add on(in addition to above)  gabapentin 100 mg three times a day with meals before returning here.        08/05/2016  f/u ov/Tully Burgo re:  Irritable larynx /uacs on 100 mg tid on ppi one daily (instructions say bid ac)  Chief Complaint  Patient presents with  . Follow-up    Cough has improved slightly. No new co's.        [name] Reflux v Neurogenic Cough Differentiator Reflux Comments  Do you awaken from a sound sleep coughing violently?                            With trouble breathing? No    Do you have choking episodes when you cannot  Get enough air, gasping for air ?              no   Do you usually cough when you lie down into  The bed, or  when you just lie down to rest ?                          no   Do you usually cough after meals or eating?         no   Do you cough when (or after) you bend over?    no   GERD SCORE     [name] Reflux v Neurogenic Cough Differentiator Neurogenic   Do you more-or-less cough all day long? Yes    Does change of temperature make you cough? no   Does laughing or chuckling cause you to cough? no   Do fumes (perfume, automobile fumes, burned  Toast, etc.,) cause you to cough ?      no   Does speaking, singing, or talking on the phone cause you to cough   ?               No    Neurogenic/Airway score  rec Increase neurontin to 300 mg three times a day Protonix 40 mg Take 30- 60 min before your first and last meals of the day  Prednisone 10 mg take  4 each am x 2 days,   2 each am x 2 days,  1 each am x 2 days and stop  Please schedule a follow up office visit in 4 weeks, sooner if needed with all medications in hand     09/09/2016  f/u ov/[facility] re:  UACs - did not bring meds/ did not try h1  Chief Complaint  Patient presents with  . Follow-up    Cough is unchanged. No new co's today.   the only change since prev ov is sensation of obvious reflux - all 3 x times were after supper  Cough continues just during the day "all day" never noct and no assoc sob No dysphagia and denies any particular meal content triggering obvious reflux and has no assoc HB on ppi bid ac   No obvious day to day or daytime variability or assoc excess/ purulent sputum or mucus plugs or hemoptysis or cp or chest tightness, subjective wheeze or overt sinus or hb symptoms. No unusual exp hx or h/o childhood pna/ asthma or knowledge of premature birth.  Sleeping ok without nocturnal  or early am exacerbation  of respiratory  c/o's or need for noct saba. Also denies any obvious fluctuation of symptoms with weather or environmental changes or other aggravating or alleviating factors except as outlined above   Current  Medications, Allergies, Complete Past Medical History, Past Surgical History, Family History, and Social History were reviewed in [employer] record.  ROS  The following are not active complaints unless bolded sore throat, dysphagia, dental problems, itching, sneezing,  nasal congestion or excess/ purulent secretions, ear ache,   fever, chills, sweats, unintended wt loss, classically pleuritic or exertional cp,  orthopnea pnd or leg swelling, presyncope, palpitations, abdominal pain, anorexia, nausea, vomiting, diarrhea  or change in bowel or bladder habits, change in stools or urine, dysuria,hematuria,  rash, arthralgias, visual complaints, headache, numbness, weakness or ataxia or problems with walking or coordination,  change in mood/affect or memory.                Objective:   Physical Exam  Hoarse amb wf  freq throat clearing during interview and exam / no candy handy     09/09/2016        149      07/08/16 146 lb (66.2 kg)  07/05/16 147 lb 14.4 oz (67.1 kg)  05/30/16 146 lb (66.2 kg)    Vital signs reviewed  - Note on arrival 02 sats  99% on RA     HEENT: nl dentition, turbinates, and oropharynx which is pristine . Nl external ear canals without cough reflex   NECK :  without JVD/Nodes/TM/ nl carotid upstrokes bilaterally   LUNGS: no acc muscle use,  Nl contour chest which is clear to A and P bilaterally without cough on insp or exp maneuvers   CV:  RRR  no s3 or murmur or increase in P2, no edema   ABD:  soft and nontender with nl inspiratory excursion in the supine position. No bruits or organomegaly, bowel sounds nl  MS:  Nl gait/ ext warm without deformities, calf tenderness, cyanosis or clubbing No obvious joint restrictions   SKIN: warm and dry without lesions  Other than scar from cervical neck surgery  NEURO:  alert, approp,  nl sensorium with  no motor deficits       I personally reviewed images and agree with radiology impression  as follows:  CXR:   06/20/16  Mediastinum hilar structures normal the lungs are clear. No pleural effusion pneumothorax. Heart size normal. Prior cervical spine fusion. Metallic density noted over the proximal right humerus.      Assessment & Plan:

## 2016-09-09 NOTE — Telephone Encounter (Signed)
I scheduled this patient's DG Esophagus on 09/20/2016 after I had it scheduled the patient wanted to check with the insurance to see what her copay would be for this test. I called for her and found out the copay would be $190.00 and the patient stated that she couldn't afford that right now so I had to call and cancel this test for now. When she can afford it she will call us back to reschedule

## 2016-09-10 ENCOUNTER — Encounter: Payer: Self-pay | Admitting: Internal Medicine

## 2016-09-10 NOTE — Assessment & Plan Note (Signed)
Allergy profile 07/08/2016 >  Eos 0.1 /  IgE  187 pos RAST cockroach   - 07/08/16 rec  trial of H1 and max gerd rx x 2 weeks then added gabapentin 100 tid late nov 2017 - cough min improved 08/05/2016 on 100 tid gabapentin and ppi qd so rec bid ppi and increase to 300 tid  - FENO 08/05/2016  =   17 on no steroids  - Dg Es rec 09/09/2016 > refused  - Trial of elavil  10 mg qhs 09/09/2016 >>>   I had an extended discussion with the patient reviewing all relevant studies completed to date and  lasting 25 minutes of a 40 minute office visit re refractory chronic cough focusing on the following ongoing concerns:   The standardized cough guidelines published in Chest by [name] in 2006 are still the best available and consist of a multiple step process (up to 12!) , not a single office visit,  and are intended  to address this problem logically,  with an alogrithm dependent on response to empiric treatment at  each progressive step  to determine a specific diagnosis with  minimal addtional testing needed. Therefore if adherence is an issue or can't be accurately verified,  it's very unlikely the standard evaluation and treatment will be successful here.    Furthermore, response to therapy (other than acute cough suppression, which should only be used short term with avoidance of narcotic containing cough syrups if possible), can be a gradual process for which the patient is not likely to  perceive immediate benefit.  Unlike going to an eye doctor where the best perscription is almost always the first one and is immediately effective, this is almost never the case in the management of chronic cough syndromes. Therefore the patient needs to commit up front to consistently adhere to recommendations  for up to 6 weeks of therapy directed at the likely underlying problem(s) before the response can be reasonably evaluated.   She has refused referral to [facility] due to insurance concerns but this is clearly an  upper airway problem based on absence of cough with insp/ exp and absence noct or exac in am and so the only option is to add Elavil and consider  Next CT sinus/neck chest then fob  (at her insistence as she's convinced there's something "sticking in" there and many studies of coughers show this to be effective in helping convince pts otherwise).  Before we would consider any of that though we need her to complete the above trial and re consider Dg Es based on presence of obvious GERD pc or let me refer her to GI.  Finally, I will inisist if she wants to continue to work with me she meet me half way by return  with all meds in hand using a trust but verify approach to confirm accurate Medication  Reconciliation The principal here is that until we are certain that the  patients are doing what we've asked, it makes no sense to ask them to do more.

## 2016-09-16 ENCOUNTER — Ambulatory Visit (INDEPENDENT_AMBULATORY_CARE_PROVIDER_SITE_OTHER): Payer: PPO | Admitting: Internal Medicine

## 2016-09-16 ENCOUNTER — Encounter: Payer: Self-pay | Admitting: Internal Medicine

## 2016-09-16 VITALS — BP 121/80 | HR 88 | Temp 98.4°F | Wt 147.2 lb

## 2016-09-16 DIAGNOSIS — S40022A Contusion of left upper arm, initial encounter: Secondary | ICD-10-CM

## 2016-09-16 DIAGNOSIS — M25511 Pain in right shoulder: Secondary | ICD-10-CM

## 2016-09-16 DIAGNOSIS — M25512 Pain in left shoulder: Secondary | ICD-10-CM | POA: Diagnosis not present

## 2016-09-16 DIAGNOSIS — H9193 Unspecified hearing loss, bilateral: Secondary | ICD-10-CM

## 2016-09-16 DIAGNOSIS — F1120 Opioid dependence, uncomplicated: Secondary | ICD-10-CM

## 2016-09-16 DIAGNOSIS — Z79891 Long term (current) use of opiate analgesic: Secondary | ICD-10-CM

## 2016-09-16 DIAGNOSIS — G8929 Other chronic pain: Secondary | ICD-10-CM

## 2016-09-16 DIAGNOSIS — M542 Cervicalgia: Secondary | ICD-10-CM

## 2016-09-16 DIAGNOSIS — S40029A Contusion of unspecified upper arm, initial encounter: Secondary | ICD-10-CM | POA: Insufficient documentation

## 2016-09-16 DIAGNOSIS — R2232 Localized swelling, mass and lump, left upper limb: Secondary | ICD-10-CM

## 2016-09-16 DIAGNOSIS — Z79899 Other long term (current) drug therapy: Secondary | ICD-10-CM

## 2016-09-16 DIAGNOSIS — F329 Major depressive disorder, single episode, unspecified: Secondary | ICD-10-CM

## 2016-09-16 DIAGNOSIS — F331 Major depressive disorder, recurrent, moderate: Secondary | ICD-10-CM

## 2016-09-16 MED ORDER — MORPHINE SULFATE 30 MG PO TABS: ORAL_TABLET | ORAL | 0 refills | Status: DC

## 2016-09-16 MED ORDER — BUPROPION HCL ER (XL) 300 MG PO TB24: 300.0000 mg | ORAL_TABLET | Freq: Every morning | ORAL | 3 refills | Status: DC

## 2016-09-16 MED ORDER — PANTOPRAZOLE SODIUM 40 MG PO TBEC: DELAYED_RELEASE_TABLET | ORAL | 2 refills | Status: DC

## 2016-09-16 NOTE — Patient Instructions (Addendum)
Refilled your pain prescription. This should last you 3 months. Make appt to see me before you run out.   Referred you to Audiology.   You have a hematoma on your left arm. Keep putting ice and heat and it should resolve.
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
## 2016-09-16 NOTE — Assessment & Plan Note (Addendum)
Has small area on left arm which appears to be likely a small hematoma. Recommended ice and heat, should resolve spontaneously.

## 2016-09-16 NOTE — Assessment & Plan Note (Signed)
Julie Williams is on chronic opioid therapy for chronic neck, shoulder pain. The date of the controlled substances contract is referenced in the [place] and / or the overview. Date of pain contract was 10/17/15 As part of the treatment plan, the [place] controlled substance database is checked at least twice yearly and the database results are appropriate. I have last reviewed the results on 05/20/2016   The last UDS was on 05/20/2016 and results are as expected. Patient needs at least a yearly UDS.   The patient is on morphine 30 mg 120 per 30 days. Adjunctive treatment includes neurontin. This regimen allows Julie Williams to function and does not cause excessive sedation or other side effects.  "The benefits of continuing opioid therapy outweigh the risks and chronic opioids will be continued. Ongoing education about safe opioid treatment is provided  Interventions today include: Refills - 3 paper Rx printed

## 2016-09-17 NOTE — Assessment & Plan Note (Signed)
Depression is under control with Wellbutrin. She was also started on Amitriptyline low dose by Pulm for her globus sensation. Refilled Wellbutrin.

## 2016-09-17 NOTE — Addendum Note (Signed)
Addended by: Lalla Brothers T on: 09/17/2016 01:32 PM   Modules accepted: Level of Service

## 2016-09-17 NOTE — Progress Notes (Signed)
Internal Medicine Clinic Attending  I saw and evaluated the patient.  I personally confirmed the key portions of the history and exam documented by Dr. Ahmed and I reviewed pertinent patient test results.  The assessment, diagnosis, and plan were formulated together and I agree with the documentation in the resident's note.   

## 2016-09-17 NOTE — Assessment & Plan Note (Signed)
Has b/l hearing loss. Wears hearing aid on right ear. Rquesting hearing evaluation referral to get new hearing aid. Hearing aid currently is 53 years old.   Will refer to Audiology.

## 2016-09-20 ENCOUNTER — Encounter (HOSPITAL_COMMUNITY): Payer: Self-pay

## 2016-09-20 ENCOUNTER — Ambulatory Visit (HOSPITAL_COMMUNITY)
Admission: EM | Admit: 2016-09-20 | Discharge: 2016-09-20 | Disposition: A | Discharge status: Home / Self Care | Payer: PPO | Attending: Family Medicine | Admitting: Family Medicine

## 2016-09-20 ENCOUNTER — Ambulatory Visit (HOSPITAL_COMMUNITY): Payer: PPO

## 2016-09-20 DIAGNOSIS — M25512 Pain in left shoulder: Secondary | ICD-10-CM

## 2016-09-20 DIAGNOSIS — M62838 Other muscle spasm: Secondary | ICD-10-CM

## 2016-09-20 MED ORDER — DICLOFENAC SODIUM 75 MG PO TBEC: 75.0000 mg | DELAYED_RELEASE_TABLET | Freq: Two times a day (BID) | ORAL | 0 refills | Status: DC

## 2016-09-20 MED ORDER — PREDNISONE 10 MG PO TABS: ORAL_TABLET | ORAL | 0 refills | Status: DC

## 2016-09-20 NOTE — Discharge Instructions (Signed)
You are being treated for musculoskeletal pain. I have prescribed two medicines. Prednisone, take 2 tablets twice a day for 6 days, and Diclofenac, take 1 tablet twice a day. Should your pain worsen or fail to improve, follow up with your primary care provider or return to clinic.
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
## 2016-09-20 NOTE — ED Triage Notes (Signed)
Extremely painful to move her left shoulder. Seen her pcp this week who said it was a hematoma and originally presented as a knot. Said the only relief is a hot bath. No injuries to it has been going on since Monday.

## 2016-10-07 ENCOUNTER — Ambulatory Visit (INDEPENDENT_AMBULATORY_CARE_PROVIDER_SITE_OTHER): Payer: PPO | Admitting: Internal Medicine

## 2016-10-07 ENCOUNTER — Encounter: Payer: Self-pay | Admitting: Internal Medicine

## 2016-10-07 VITALS — BP 118/70 | HR 79 | Ht 65.5 in | Wt 151.0 lb

## 2016-10-07 DIAGNOSIS — R05 Cough: Secondary | ICD-10-CM

## 2016-10-07 DIAGNOSIS — R058 Other specified cough: Secondary | ICD-10-CM

## 2016-10-07 NOTE — Assessment & Plan Note (Signed)
Onset was 2014 Allergy profile 07/08/2016 >  Eos 0.1 /  IgE  187 pos RAST cockroach   - 07/08/16 rec  trial of H1 and max gerd rx x 2 weeks then added gabapentin 100 tid late nov 2017 - cough min improved 08/05/2016 on 100 tid gabapentin and ppi qd so rec bid ppi and increased to 300 tid  - FENO 08/05/2016  =   17 on no steroids  - Dg Es rec 09/09/2016 > refused due to cost - Trial of elavil  10 mg qhs 09/09/2016 > cough resolved but gave meds no credit    I had an extended final summary discussion with the patient reviewing all relevant studies completed to date and  lasting 15 to 20 minutes of a 25 minute visit on the following issues:    No evidence of any pulmonary source for cough. Best rx has been addition of elavil and 1st gen H1 but still convinced something is stuck in throat so rec Sinus/Chest CT > refuses   Since says cough is better p added subtherapeutic doses of mucinex rec start tapering her UACS meds starting with gabapentin and then elavil and if flares will know which was the more important  F/u ent planned  Each maintenance medication was reviewed in detail including most importantly the difference between maintenance and as needed and under what circumstances the prns are to be used.  Please see AVS for specific  Instructions which are unique to this visit and I personally typed out  which were reviewed in detail in writing with the patient and a copy provided.

## 2016-10-07 NOTE — Progress Notes (Signed)
Subjective:    Patient ID: Julie Williams, female    DOB: 02-06-1966,    MRN: [id number]    Brief patient profile: f 41 yowf quit smoking 2014 s/p  3rd cervical disc  at [facility] in 2012 and new sensation of globus x early 2014 referred to pulmonary clinic 07/08/2016 by Dr [name].   History of Present Illness  07/08/2016 1st [facility] office visit/ [facility]   Chief Complaint  Patient presents with  . Pulmonary Consult    Referred by Dr. [name]. Pt c/o throat clearing for the past 1 to 2 yrs. She states she will occ produce some sputum that is clear. She states "nothing helps and nothing makes it worse".     onset was abrupt x 09/2012 typically worse around 6pm with constant urge while awake to clear her throat with no excess/ purulent sputum or mucus plugs  And only present while awake with 2 neg ENT evals [name] at [facility] and Dr [name]  Neg resp to allegra, uses lots of cough drops and drinks a lot of tea and this helps more than anything else to date  rec Upper airway cough syndrome - irritable larynx syndrome - [name]  Pantoprazole (protonix) 40 mg   Take 30- 60 min before your first and last meals of the day  GERD diet  For drainage / throat tickle try take CHLORPHENIRAMINE  4 mg - take one every 4 hours as needed - Add on(in addition to above)  gabapentin 100 mg three times a day with meals before returning here.        08/05/2016  f/u ov/Wert re:  Irritable larynx /uacs on 100 mg tid on ppi one daily (instructions say bid ac)  Chief Complaint  Patient presents with  . Follow-up    Cough has improved slightly. No new co's.        [name] Reflux v Neurogenic Cough Differentiator Reflux Comments  Do you awaken from a sound sleep coughing violently?                            With trouble breathing? No    Do you have choking episodes when you cannot  Get enough air, gasping for air ?              no   Do you usually cough when you lie down into  The bed, or  when you just lie down to rest ?                          no   Do you usually cough after meals or eating?         no   Do you cough when (or after) you bend over?    no   GERD SCORE     [name] Reflux v Neurogenic Cough Differentiator Neurogenic   Do you more-or-less cough all day long? Yes    Does change of temperature make you cough? no   Does laughing or chuckling cause you to cough? no   Do fumes (perfume, automobile fumes, burned  Toast, etc.,) cause you to cough ?      no   Does speaking, singing, or talking on the phone cause you to cough   ?               No    Neurogenic/Airway score  rec Increase neurontin to 300 mg three times a day Protonix 40 mg Take 30- 60 min before your first and last meals of the day  Prednisone 10 mg take  4 each am x 2 days,   2 each am x 2 days,  1 each am x 2 days and stop  Please schedule a follow up office visit in 4 weeks, sooner if needed with all medications in hand     09/09/2016  f/u ov/Wert re:  UACs - did not bring meds/ did not try h1  Chief Complaint  Patient presents with  . Follow-up    Cough is unchanged. No new co's today.   the only change since prev ov is sensation of obvious reflux - all 3 x times were after supper  Cough continues just during the day "all day" never noct and no assoc sob No dysphagia and denies any particular meal content triggering obvious reflux and has no assoc HB on ppi bid ac  rec Elavil 10 mg at bedtime added to your present regimen which you should not change  GERD diet  Keep the candy handy and stop the tea and just sip water  Please see patient coordinator before you leave today  to schedule Dg Es > refused due to cost Please schedule a follow up office visit in 4 weeks, sooner if needed with all medications in hand     10/07/2016  f/u ov/Wert re: UACS since 2014 convinced something is stuck in her throat but no more coughing  Chief Complaint  Patient presents with  . Follow-up    Cough  has resolved.   mucinex did more than anything else taking one 600mg  each am seems to work (gives not credit to anything offered here Most mornings wakes up s symptoms but w/in 30 min feels the need to clear her throat  Takes chlorpheniramine twice daily seems to help some with sense of pnds   No obvious day to day or daytime variability or assoc excess/ purulent sputum or mucus plugs or hemoptysis or cp or chest tightness, subjective wheeze or overt sinus or hb symptoms. No unusual exp hx or h/o childhood pna/ asthma or knowledge of premature birth.  Sleeping ok without nocturnal  or early am exacerbation  of respiratory  c/o's or need for noct saba. Also denies any obvious fluctuation of symptoms with weather or environmental changes or other aggravating or alleviating factors except as outlined above   Current Medications, Allergies, Complete Past Medical History, Past Surgical History, Family History, and Social History were reviewed in [employer] record.  ROS  The following are not active complaints unless bolded sore throat, dysphagia, dental problems, itching, sneezing,  nasal congestion or excess/ purulent secretions, ear ache,   fever, chills, sweats, unintended wt loss, classically pleuritic or exertional cp,  orthopnea pnd or leg swelling, presyncope, palpitations, abdominal pain, anorexia, nausea, vomiting, diarrhea  or change in bowel or bladder habits, change in stools or urine, dysuria,hematuria,  rash, arthralgias, visual complaints, headache, numbness, weakness or ataxia or problems with walking or coordination,  change in mood/affect or memory.                Objective:   Physical Exam  Somber  amb wf  Bizarre affect, would not let nursing look at her meds though she brought them     10/07/2016       151  09/09/2016        149  07/08/16 146 lb (66.2 kg)  07/05/16 147 lb 14.4 oz (67.1 kg)  05/30/16 146 lb (66.2 kg)    Vital signs reviewed  -  Note on arrival 02 sats  99% on RA     HEENT: nl dentition, turbinates, and oropharynx which is pristine . Nl external ear canals without cough reflex   NECK :  without JVD/Nodes/TM/ nl carotid upstrokes bilaterally   LUNGS: no acc muscle use,  Nl contour chest which is clear to A and P bilaterally without cough on insp or exp maneuvers   CV:  RRR  no s3 or murmur or increase in P2, no edema   ABD:  soft and nontender with nl inspiratory excursion in the supine position. No bruits or organomegaly, bowel sounds nl  MS:  Nl gait/ ext warm without deformities, calf tenderness, cyanosis or clubbing No obvious joint restrictions   SKIN: warm and dry without lesions  Other than scar from cervical neck surgery  NEURO:  alert, approp, nl sensorium with  no motor deficits       I personally reviewed images and agree with radiology impression as follows:  CXR:   06/20/16  Mediastinum hilar structures normal the lungs are clear. No pleural effusion pneumothorax. Heart size normal. Prior cervical spine fusion. Metallic density noted over the proximal right humerus.      Assessment & Plan:   Outpatient Encounter Prescriptions as of 10/07/2016  Medication Sig  . amitriptyline (ELAVIL) 10 MG tablet Take 1 tablet (10 mg total) by mouth at bedtime.  Marland Kitchen aspirin EC 81 MG tablet Take 81 mg by mouth daily.  Marland Kitchen buPROPion (WELLBUTRIN XL) 300 MG 24 hr tablet Take 1 tablet (300 mg total) by mouth every morning.  . chlorpheniramine (CHLOR-TRIMETON) 4 MG tablet Take 4 mg by mouth every 4 (four) hours as needed for allergies.  Marland Kitchen EPINEPHrine 0.3 mg/0.3 mL IJ SOAJ injection Inject 0.3 mLs (0.3 mg total) into the muscle once.  . gabapentin (NEURONTIN) 300 MG capsule Take 1 capsule (300 mg total) by mouth 3 (three) times daily.  Marland Kitchen guaiFENesin (MUCINEX) 600 MG 12 hr tablet Take 600 mg by mouth 2 (two) times daily as needed.  Marland Kitchen morphine (MSIR) 30 MG tablet Take 1/2 to 1 tab every 4-6 hours for pain as  needed.  . pantoprazole (PROTONIX) 40 MG tablet Take 30- 60 min before your first and last meals of the day  . [DISCONTINUED] diclofenac (VOLTAREN) 75 MG EC tablet Take 1 tablet (75 mg total) by mouth 2 (two) times daily.  . [DISCONTINUED] predniSONE (DELTASONE) 10 MG tablet Take two tablets by mouth twice a day for 6 days.   No facility-administered encounter medications on file as of 10/07/2016.

## 2016-10-07 NOTE — Patient Instructions (Addendum)
First stop gapentin and then a couple weeks later stop the amitryptilline to see if you can tell which one if either helped you and if you note you are worse then restart the one you stopped   Mucinex can be up to 1200mg  every 12 hours as needed for cough   For drainage / throat tickle try take CHLORPHENIRAMINE  4 mg - take one every 4 hours as needed - available over the counter- may cause drowsiness so start with just a bedtime dose or two and see how you tolerate it before trying in daytime    Next steps in the work up are ct sinus and Chest - call if you want me to schedule this or refer you to another provider

## 2016-10-29 ENCOUNTER — Encounter: Payer: Self-pay | Admitting: Internal Medicine

## 2016-11-20 ENCOUNTER — Telehealth: Payer: Self-pay | Admitting: Internal Medicine

## 2016-11-20 NOTE — Telephone Encounter (Signed)
APT. REMINDER CALL, LMTCB °

## 2016-11-21 ENCOUNTER — Ambulatory Visit (INDEPENDENT_AMBULATORY_CARE_PROVIDER_SITE_OTHER): Payer: PPO | Admitting: Internal Medicine

## 2016-11-21 ENCOUNTER — Encounter (INDEPENDENT_AMBULATORY_CARE_PROVIDER_SITE_OTHER): Payer: Self-pay

## 2016-11-21 ENCOUNTER — Encounter: Payer: Self-pay | Admitting: Internal Medicine

## 2016-11-21 VITALS — BP 130/92 | HR 70 | Temp 98.0°F | Ht 65.0 in | Wt 153.5 lb

## 2016-11-21 DIAGNOSIS — Z79891 Long term (current) use of opiate analgesic: Secondary | ICD-10-CM

## 2016-11-21 DIAGNOSIS — G8929 Other chronic pain: Secondary | ICD-10-CM | POA: Diagnosis not present

## 2016-11-21 DIAGNOSIS — F329 Major depressive disorder, single episode, unspecified: Secondary | ICD-10-CM | POA: Diagnosis not present

## 2016-11-21 DIAGNOSIS — R070 Pain in throat: Secondary | ICD-10-CM | POA: Diagnosis not present

## 2016-11-21 DIAGNOSIS — Z87891 Personal history of nicotine dependence: Secondary | ICD-10-CM | POA: Diagnosis not present

## 2016-11-21 DIAGNOSIS — M47812 Spondylosis without myelopathy or radiculopathy, cervical region: Secondary | ICD-10-CM

## 2016-11-21 DIAGNOSIS — Z79899 Other long term (current) drug therapy: Secondary | ICD-10-CM

## 2016-11-21 DIAGNOSIS — K219 Gastro-esophageal reflux disease without esophagitis: Secondary | ICD-10-CM

## 2016-11-21 DIAGNOSIS — F32A Depression, unspecified: Secondary | ICD-10-CM

## 2016-11-21 DIAGNOSIS — F1120 Opioid dependence, uncomplicated: Secondary | ICD-10-CM | POA: Diagnosis not present

## 2016-11-21 MED ORDER — PANTOPRAZOLE SODIUM 40 MG PO TBEC: DELAYED_RELEASE_TABLET | ORAL | 2 refills | Status: DC

## 2016-11-21 MED ORDER — MORPHINE SULFATE 30 MG PO TABS: ORAL_TABLET | ORAL | 0 refills | Status: DC

## 2016-11-21 MED ORDER — RANITIDINE HCL 75 MG PO TABS: 75.0000 mg | ORAL_TABLET | Freq: Two times a day (BID) | ORAL | 0 refills | Status: DC

## 2016-11-21 MED ORDER — CITALOPRAM HYDROBROMIDE 20 MG PO TABS: 20.0000 mg | ORAL_TABLET | Freq: Every day | ORAL | 1 refills | Status: DC

## 2016-11-21 NOTE — Patient Instructions (Signed)
Ms. Streed,  For your acid reflux he has been referred to GI for an upper endoscopy. In the meantime continue to take pantoprazole 40 mg twice a day and you can add ranitidine before meals to help with symptoms.  For depression symptoms please start taking Celexa 20 mg (1 pill) once a day for one week after one week you can increase the dose to 40 mg (2 pills)  Please make an appointment to be seen in 3 months. My schedule will be set up in the next few months on your next visit in June we can try and set up the next several future appointments.
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
## 2016-11-24 DIAGNOSIS — F1120 Opioid dependence, uncomplicated: Secondary | ICD-10-CM | POA: Insufficient documentation

## 2016-11-24 NOTE — Assessment & Plan Note (Signed)
Assessment: Throat discomfort Patient reports feeling a mass near her sternal notch that she reports is growing in size. She denies pain, hoarseness, difficulty swallowing solids or liquids, or weight loss. On exam no mass was visualized or palpated. Patient has had similar complaint on previous clinic visits and an ultrasound was ordered.  An ultrasound on 05/24/2016 of soft tissue head/neck noted a normal thyroid exam with no suspicious solid or cystic lesions at the area of concern.  Patient was also referred to ENT on 05/30/2016.  She was diagnosed with irritable larynx syndrome.  Plan -continue to monitor

## 2016-11-24 NOTE — Assessment & Plan Note (Signed)
Assessment:  GERD Patient states she takes omeprazole 40 mg in the morning and pantoprazole 40 mg in the evening to help with her current symptoms. Despite this medication she continues to have belching and acid reflux.  Plan -Referral to GI for EGD - Added ranitidine before meals

## 2016-11-24 NOTE — Assessment & Plan Note (Addendum)
Assessment:  Chronic pain from Cervical osteoarthritis on opioid medication Patient had a MR cervical spine without contrast 02/2010 that showed cervical spondylosis most pronounced on C4-C5 disc osteophyte complex contacting the cervical cord.   She had an ACDF performed at [facility] in 2012.  Pain contract with Dr. Genene Churn on 10/17/15. Patient is on morphine 30 mg #120 per month. Her pain contract goals are to be able to carry out household chores and get up from bed in the morning.  She states she is able to accomplish these goals with her current medication regimen.      Plan -UDS - prescription of morphine 30 mg #120, 3 prescriptions were given in the office

## 2016-11-24 NOTE — Assessment & Plan Note (Signed)
Assessment: Depression Patient states that since her ACDF surgery in 2012 she has not been able to be as active as she once was. She states that this has led her to be depressed. She reports a loss in interest in activities and decrease in energy. She has tried antidepressants in the past including Wellbutrin, fluoxetine and Paxil.  She states that these have not helped her.  Plan -Start Celexa at 20 mg and increase in one week to 40 mg and continue at that dose

## 2016-11-25 ENCOUNTER — Telehealth: Payer: Self-pay

## 2016-11-25 NOTE — Progress Notes (Signed)
Medicine attending: Medical history, presenting problems, physical findings, and medications, reviewed with resident physician Dr Jessica Hoffman on the day of the patient visit and I concur with her evaluation and management plan. 

## 2016-11-25 NOTE — Telephone Encounter (Signed)
Patient called to report Protonix is not covered by her insurance she can not afford it requesting it to be changed to something that is covered by her insurance

## 2016-11-26 ENCOUNTER — Other Ambulatory Visit: Payer: Self-pay | Admitting: Internal Medicine

## 2016-11-26 MED ORDER — LANSOPRAZOLE 30 MG PO TBDP: 30.0000 mg | ORAL_TABLET | Freq: Two times a day (BID) | ORAL | 2 refills | Status: DC

## 2016-11-26 NOTE — Telephone Encounter (Signed)
Omeprazole will interact with her Celexa, therefore I sent Prevacid to her pharmacy.  It is also over the counter.

## 2016-11-27 LAB — TOXASSURE SELECT,+ANTIDEPR,UR

## 2016-12-19 ENCOUNTER — Encounter: Payer: PPO | Admitting: Internal Medicine

## 2016-12-24 HISTORY — PX: UPPER GI ENDOSCOPY: SHX6162

## 2017-01-02 ENCOUNTER — Ambulatory Visit (INDEPENDENT_AMBULATORY_CARE_PROVIDER_SITE_OTHER): Payer: PPO | Admitting: Gastroenterology

## 2017-01-02 ENCOUNTER — Encounter: Payer: Self-pay | Admitting: Gastroenterology

## 2017-01-02 ENCOUNTER — Encounter (INDEPENDENT_AMBULATORY_CARE_PROVIDER_SITE_OTHER): Payer: Self-pay

## 2017-01-02 VITALS — BP 100/66 | HR 66 | Ht 65.0 in | Wt 144.0 lb

## 2017-01-02 DIAGNOSIS — F458 Other somatoform disorders: Secondary | ICD-10-CM

## 2017-01-02 DIAGNOSIS — K219 Gastro-esophageal reflux disease without esophagitis: Secondary | ICD-10-CM

## 2017-01-02 DIAGNOSIS — R0989 Other specified symptoms and signs involving the circulatory and respiratory systems: Secondary | ICD-10-CM

## 2017-01-02 MED ORDER — OMEPRAZOLE 40 MG PO CPDR: 40.0000 mg | DELAYED_RELEASE_CAPSULE | Freq: Every day | ORAL | 3 refills | Status: DC
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
## 2017-01-02 NOTE — Patient Instructions (Signed)
If you are age 53 or older, your body mass index should be between 23-30. Your Body mass index is 23.96 kg/m. If this is out of the aforementioned range listed, please consider follow up with your Primary Care Provider.  If you are age 70 or younger, your body mass index should be between 19-25. Your Body mass index is 23.96 kg/m. If this is out of the aformentioned range listed, please consider follow up with your Primary Care Provider.   You have been scheduled for an endoscopy. Please follow written instructions given to you at your visit today. If you use inhalers (even only as needed), please bring them with you on the day of your procedure. Your physician has requested that you go to www.startemmi.com and enter the access code given to you at your visit today. This web site gives a general overview about your procedure. However, you should still follow specific instructions given to you by our office regarding your preparation for the procedure.  We have sent the following medications to your pharmacy for you to pick up at your convenience:  Omeprazole  Thank you.

## 2017-01-03 ENCOUNTER — Encounter: Payer: Self-pay | Admitting: Gastroenterology

## 2017-01-03 ENCOUNTER — Ambulatory Visit (AMBULATORY_SURGERY_CENTER): Payer: PPO | Admitting: Gastroenterology

## 2017-01-03 VITALS — BP 102/69 | HR 80 | Temp 99.3°F | Resp 11 | Ht 65.0 in | Wt 144.0 lb

## 2017-01-03 DIAGNOSIS — R0989 Other specified symptoms and signs involving the circulatory and respiratory systems: Secondary | ICD-10-CM

## 2017-01-03 DIAGNOSIS — G458 Other transient cerebral ischemic attacks and related syndromes: Secondary | ICD-10-CM | POA: Diagnosis not present

## 2017-01-03 DIAGNOSIS — F458 Other somatoform disorders: Secondary | ICD-10-CM

## 2017-01-03 MED ORDER — SODIUM CHLORIDE 0.9 % IV SOLN: 500.0000 mL | INTRAVENOUS | Status: DC

## 2017-01-03 NOTE — Patient Instructions (Signed)
Discharge instructions given. Normal exam. Resume previous medications. YOU HAD AN ENDOSCOPIC PROCEDURE TODAY AT THE [facility]:   Refer to the procedure report that was given to you for any specific questions about what was found during the examination.  If the procedure report does not answer your questions, please call your gastroenterologist to clarify.  If you requested that your care partner not be given the details of your procedure findings, then the procedure report has been included in a sealed envelope for you to review at your convenience later.  YOU SHOULD EXPECT: Some feelings of bloating in the abdomen. Passage of more gas than usual.  Walking can help get rid of the air that was put into your GI tract during the procedure and reduce the bloating. If you had a lower endoscopy (such as a colonoscopy or flexible sigmoidoscopy) you may notice spotting of blood in your stool or on the toilet paper. If you underwent a bowel prep for your procedure, you may not have a normal bowel movement for a few days.  Please Note:  You might notice some irritation and congestion in your nose or some drainage.  This is from the oxygen used during your procedure.  There is no need for concern and it should clear up in a day or so.  SYMPTOMS TO REPORT IMMEDIATELY:   Following upper endoscopy (EGD)  Vomiting of blood or coffee ground material  New chest pain or pain under the shoulder blades  Painful or persistently difficult swallowing  New shortness of breath  Fever of 100F or higher  Black, tarry-looking stools  For urgent or emergent issues, a gastroenterologist can be reached at any hour by calling [phone].   DIET:  We do recommend a small meal at first, but then you may proceed to your regular diet.  Drink plenty of fluids but you should avoid alcoholic beverages for 24 hours.  ACTIVITY:  You should plan to take it easy for the rest of today and you should NOT DRIVE or  use heavy machinery until tomorrow (because of the sedation medicines used during the test).    FOLLOW UP: Our staff will call the number listed on your records the next business day following your procedure to check on you and address any questions or concerns that you may have regarding the information given to you following your procedure. If we do not reach you, we will leave a message.  However, if you are feeling well and you are not experiencing any problems, there is no need to return our call.  We will assume that you have returned to your regular daily activities without incident.  If any biopsies were taken you will be contacted by phone or by letter within the next 1-3 weeks.  Please call us at [phone] if you have not heard about the biopsies in 3 weeks.    SIGNATURES/CONFIDENTIALITY: You and/or your care partner have signed paperwork which will be entered into your electronic medical record.  These signatures attest to the fact that that the information above on your After Visit Summary has been reviewed and is understood.  Full responsibility of the confidentiality of this discharge information lies with you and/or your care-partner. 

## 2017-01-03 NOTE — Op Note (Signed)
[facility] Patient Name: Julie Williams Procedure Date: 01/03/2017 9:46 AM MRN: [id number] Endoscopist: Remo Lipps P. Armbruster MD, MD Age: 51 Referring MD:  Date of Birth: 11-Jan-1966 Gender: Female Account #: [id number] Procedure:                Upper GI endoscopy Indications:              Heartburn, Globus sensation - prior workup per ENT                            and pulmonary for globus negative Medicines:                Monitored Anesthesia Care Procedure:                Pre-Anesthesia Assessment:                           - Prior to the procedure, a History and Physical                            was performed, and patient medications and                            allergies were reviewed. The patient's tolerance of                            previous anesthesia was also reviewed. The risks                            and benefits of the procedure and the sedation                            options and risks were discussed with the patient.                            All questions were answered, and informed consent                            was obtained. Prior Anticoagulants: The patient has                            taken no previous anticoagulant or antiplatelet                            agents. ASA Grade Assessment: II - A patient with                            mild systemic disease. After reviewing the risks                            and benefits, the patient was deemed in                            satisfactory condition to undergo the procedure.  After obtaining informed consent, the endoscope was                            passed under direct vision. Throughout the                            procedure, the patient's blood pressure, pulse, and                            oxygen saturations were monitored continuously. The                            Endoscope was introduced through the mouth, and                            advanced to the  second part of duodenum. The upper                            GI endoscopy was accomplished without difficulty.                            The patient tolerated the procedure well. Scope In: Scope Out: Findings:                 Esophagogastric landmarks were identified: the                            Z-line was found at 39 cm, the gastroesophageal                            junction was found at 39 cm and the upper extent of                            the gastric folds was found at 39 cm from the                            incisors.                           The exam of the esophagus was otherwise normal. No                            evidence of esophagitis or gastric inlet patch.                           The entire examined stomach was normal.                           The duodenal bulb and second portion of the                            duodenum were normal. Complications:            No immediate complications. Estimated blood loss:  Minimal. Estimated Blood Loss:     Estimated blood loss was minimal. Impression:               - Esophagogastric landmarks identified.                           - Normal esophagus - no evidence of pathology to                            cause symptoms                           - Normal stomach.                           - Normal duodenal bulb and second portion of the                            duodenum. Recommendation:           - Patient has a contact number available for                            emergencies. The signs and symptoms of potential                            delayed complications were discussed with the                            patient. Return to normal activities tomorrow.                            Written discharge instructions were provided to the                            patient.                           - Resume previous diet.                           - Continue present medications.                            - Consideration for 24 HR pH Impedance study and                            esophageal manometry (on omeprazole) to further                            evaluate symptoms, more objectively evaluate for                            reflux and rule out dysmotility / hypertensive UES Remo Lipps P. Armbruster MD, MD 01/03/2017 10:06:09 AM This report has been signed electronically.

## 2017-01-03 NOTE — Progress Notes (Signed)
Dental advisory given to patient 

## 2017-01-03 NOTE — Progress Notes (Signed)
Pt's states no medical or surgical changes since previsit or office visit. 

## 2017-01-06 ENCOUNTER — Telehealth: Payer: Self-pay

## 2017-01-06 NOTE — Telephone Encounter (Signed)
  Follow up Call-  Call back number 01/03/2017 08/21/2016  Post procedure Call Back phone  # [phone]  Permission to leave phone message Yes Yes  Some recent data might be hidden     Patient questions:  Do you have a fever, pain , or abdominal swelling? No. Pain Score  0 *  Have you tolerated food without any problems? Yes.    Have you been able to return to your normal activities? Yes.    Do you have any questions about your discharge instructions: Diet   No. Medications  No. Follow up visit  No.  Do you have questions or concerns about your Care? No.  Actions: * If pain score is 4 or above: No action needed, pain <4.

## 2017-01-07 ENCOUNTER — Other Ambulatory Visit: Payer: Self-pay

## 2017-01-07 ENCOUNTER — Telehealth: Payer: Self-pay

## 2017-01-07 DIAGNOSIS — K219 Gastro-esophageal reflux disease without esophagitis: Secondary | ICD-10-CM

## 2017-01-07 NOTE — Telephone Encounter (Signed)
Patient advised of tests scheduled for 01/22/17 at [facility] arrive at 10:00 am for 10:30 am. Instructions mailed to patient.

## 2017-01-07 NOTE — Telephone Encounter (Signed)
-----   Message from Manus Gunning, MD sent at 01/03/2017  5:41 PM EDT ----- Regarding: 24 hr ph / [facility], Can you please help coordinate 24 HR pH and manometry for this patient, done on omeprazole please?  Thanks much

## 2017-01-13 ENCOUNTER — Telehealth: Payer: Self-pay | Admitting: Gastroenterology

## 2017-01-13 NOTE — Telephone Encounter (Signed)
Went over instructions with patient about medications. She needs to take her morphine and aspirin in the am, asked that she take these by 6:00 am with a small sip of water. The rest of her medications she can take after the test.

## 2017-01-22 ENCOUNTER — Ambulatory Visit (HOSPITAL_COMMUNITY)
Admission: RE | Admit: 2017-01-22 | Discharge: 2017-01-22 | Disposition: A | Discharge status: Home / Self Care | Payer: PPO | Source: Ambulatory Visit | Attending: Gastroenterology | Admitting: Gastroenterology

## 2017-01-22 ENCOUNTER — Encounter (HOSPITAL_COMMUNITY)
Admission: RE | Disposition: A | Discharge status: Home / Self Care | Payer: Self-pay | Source: Ambulatory Visit | Attending: Gastroenterology

## 2017-01-22 DIAGNOSIS — R0989 Other specified symptoms and signs involving the circulatory and respiratory systems: Secondary | ICD-10-CM

## 2017-01-22 DIAGNOSIS — K219 Gastro-esophageal reflux disease without esophagitis: Secondary | ICD-10-CM | POA: Diagnosis not present

## 2017-01-22 DIAGNOSIS — R1319 Other dysphagia: Secondary | ICD-10-CM | POA: Diagnosis not present

## 2017-01-22 DIAGNOSIS — F458 Other somatoform disorders: Secondary | ICD-10-CM | POA: Diagnosis not present

## 2017-01-22 DIAGNOSIS — R09A2 Foreign body sensation, throat: Secondary | ICD-10-CM

## 2017-01-22 HISTORY — PX: PH IMPEDANCE STUDY: SHX5565

## 2017-01-22 HISTORY — PX: ESOPHAGEAL MANOMETRY: SHX5429

## 2017-01-22 SURGERY — MANOMETRY, ESOPHAGUS

## 2017-01-22 MED ORDER — LIDOCAINE VISCOUS 2 % MT SOLN
OROMUCOSAL | Status: AC
  Filled 2017-01-22: qty 15

## 2017-01-22 SURGICAL SUPPLY — 2 items
FACESHIELD LNG OPTICON STERILE (SAFETY) IMPLANT
GLOVE BIO SURGEON STRL SZ8 (GLOVE) ×6 IMPLANT

## 2017-01-22 NOTE — Progress Notes (Signed)
Esophageal manometry and Ph study done per protocol.  Pt tolerated both procedures well without complications.  Pt on medication.  Pt educated and verbalized understanding of instruction regarding Ph study and equipment.  Dr. Silverio Decamp to be notified today of manometry.  Laverta Baltimore, RN

## 2017-01-23 ENCOUNTER — Encounter (HOSPITAL_COMMUNITY): Payer: Self-pay | Admitting: Gastroenterology

## 2017-01-23 DIAGNOSIS — R1319 Other dysphagia: Secondary | ICD-10-CM

## 2017-01-24 ENCOUNTER — Telehealth: Payer: Self-pay | Admitting: Gastroenterology

## 2017-01-24 DIAGNOSIS — R0989 Other specified symptoms and signs involving the circulatory and respiratory systems: Secondary | ICD-10-CM

## 2017-01-24 NOTE — Telephone Encounter (Signed)
Patient underwent esophageal manometry after there was no evidence of abnormality on EGD to account for her symptoms of globus.   Manometry interpreted by Dr. Silverio Decamp. There was evidence of EGJ outflow obstruction, with an IRP of 23, but NO evidence for achalasia. There was also normal basal UES pressure but the UES did not completely relax with residual pressure of 14.7. There was no pathology on EGD to account for these findings.  She is on chronic narcotics which can cause this finding on manometry, and without dysphagia I'm not convinced it is causing her symptoms, which I suspect may more likely be neuropathic following her neck surgery. She can consider resuming a TCA, while ENT could consider Botox injection to the UES, not sure if this would help her symptoms or not.   She will be contacted with these results and recommendations.

## 2017-01-24 NOTE — Telephone Encounter (Signed)
I called the patient. Unable to leave a voice mail. Will call back again, no answer

## 2017-01-28 NOTE — Telephone Encounter (Signed)
I called the patient and relayed the results.  She did perform a pH study which I have yet to receive from Endo at [facility]. I will contact them and get the pH study report, and notify the patient of final recommendations. She agreed

## 2017-01-30 DIAGNOSIS — R0989 Other specified symptoms and signs involving the circulatory and respiratory systems: Secondary | ICD-10-CM

## 2017-01-30 MED ORDER — AMITRIPTYLINE HCL 25 MG PO TABS: 25.0000 mg | ORAL_TABLET | Freq: Every day | ORAL | 3 refills | Status: DC

## 2017-01-30 NOTE — Telephone Encounter (Signed)
Called patient and relayed results. Her pH study shows she does not have acid reflux driving her symptoms.  Her manometry test shows some nonspecific elevated pressures at the UES and GEJ but this could be due to chronic narcotics and hard to say if related to her symptoms at all.  Prior EGD did not show any pathology there. Her symptoms are chronic dating back to her 2012 neck surgery - it's possible she has a neuropathic injury causing these issues. She was reported to have had some benefit from Elavil per Dr. Gustavus Bryant note, I think a trial of this again is reasonable. Will try 25mg  qHS and titrate up as needed. If no benefit she could consider another ENT evaluation with consideration for botox injection to the UES, but again it is unclear if this finding on manometry is related to narcotics, and not sure if this would provide any benefit to her symptoms. She agreed with the plan as outlined. She can let me know how she is doing in the upcoming weeks.

## 2017-01-30 NOTE — Telephone Encounter (Signed)
PH study obtained and interpreted.   Results: - Study time of 24 hours: adequate study time - Demeester score of 11.8 (14.72 = upper limit of normal)  - Symptom index of only 25% (7 of 28 reported symptoms) for acid reflux, 20% for nonacid reflux - symptoms do not appear to correlate with reflux episodes - 89% of reflux episodes did reach the proximal esophagus, and majority of reflux episodes were weakly acidic  Summary and Interpretation: - study consistent with controlled acid reflux disease on current dosing of omeprazole - reported symptoms do NOT appear to correlate with reflux episodes, although the majority of reflux episodes do reach proximal esophagus

## 2017-01-31 ENCOUNTER — Telehealth: Payer: Self-pay | Admitting: Gastroenterology

## 2017-01-31 NOTE — Telephone Encounter (Signed)
Called patient. She had additional questions about Elavil. She has been on this in the past, had concerns about it interacting with Citalopram 20mg . I did not realize she was on Citalopram at that dose, was not listed in her medications at clinic visit. I think it is okay to still use Elavil in this setting but would not increase dose of Citalopram.   We discussed for several minutes why I am recommending it again. If she is anxious about it or concerned about it, she does not have to try it. She is not concerned about her tolerance of it as she has done well with it in the past. I gave her a 25mg  tablet, she will cut it in half and take 12.5mg  q HS for one week, then increase to 25mg  q HS after that. If she has any side effects she will call me. Otherwise would like a 4 week trial and she will let me know how she is doing. Using this to treat globus which has persisted and causing her significant distress. Prior PH study negative, manometry findings relayed to her, she does not want to try Botox injection of UES which I would agree with.

## 2017-01-31 NOTE — Telephone Encounter (Signed)
Patient has further questions. Thanks.

## 2017-02-06 ENCOUNTER — Encounter: Payer: Self-pay | Admitting: Internal Medicine

## 2017-02-06 ENCOUNTER — Ambulatory Visit (INDEPENDENT_AMBULATORY_CARE_PROVIDER_SITE_OTHER): Payer: PPO | Admitting: Internal Medicine

## 2017-02-06 DIAGNOSIS — M47812 Spondylosis without myelopathy or radiculopathy, cervical region: Secondary | ICD-10-CM

## 2017-02-06 DIAGNOSIS — F329 Major depressive disorder, single episode, unspecified: Secondary | ICD-10-CM | POA: Diagnosis not present

## 2017-02-06 DIAGNOSIS — Z79891 Long term (current) use of opiate analgesic: Secondary | ICD-10-CM

## 2017-02-06 DIAGNOSIS — F330 Major depressive disorder, recurrent, mild: Secondary | ICD-10-CM

## 2017-02-06 MED ORDER — CITALOPRAM HYDROBROMIDE 20 MG PO TABS: 20.0000 mg | ORAL_TABLET | Freq: Every day | ORAL | 2 refills | Status: DC

## 2017-02-06 MED ORDER — MORPHINE SULFATE 30 MG PO TABS: ORAL_TABLET | ORAL | 0 refills | Status: DC

## 2017-02-06 MED ORDER — NALOXONE HCL 0.4 MG/ML IJ SOLN: 0.4000 mg | INTRAMUSCULAR | 1 refills | Status: DC | PRN

## 2017-02-06 NOTE — Patient Instructions (Addendum)
Ms. Oxendine,  It was a pleasure seeing you today. Please continue taking Celexa 20 mg daily and please discontinue your amitriptyline. Please follow-up in 3 months

## 2017-02-07 ENCOUNTER — Other Ambulatory Visit: Payer: Self-pay | Admitting: *Deleted

## 2017-02-07 ENCOUNTER — Other Ambulatory Visit: Payer: Self-pay | Admitting: Internal Medicine

## 2017-02-07 MED ORDER — NALOXONE HCL 4 MG/0.1ML NA LIQD: 1.0000 | Freq: Once | NASAL | 0 refills | Status: AC
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
## 2017-02-07 NOTE — Assessment & Plan Note (Signed)
Assessment: Major Depressive Disorder Patient was started on Celexa 20 mg on previous clinic visit. PHQ-9 today was 9 out of 22.  Patient appears in better spirits today as on previous clinic visit she was tearful when discussing her depression.  Will continue Celexa at 20mg .  Patient was started on amitriptyline by her GI doctor recently to help with chronic sense of globus and throat clearing.  Patient states she feels groggy on the medication. Due to improvement with depressive symptoms with Celexa I recommended staying on Celexa and discontinuing amitriptyline as the combination is not advised.  Patient agreed with the plan.  Plan: -Continue Celexa 20 mg - Discontinue amitriptyline - Reassess PHQ-9 on next visit

## 2017-02-07 NOTE — Progress Notes (Signed)
Medicine attending: Medical history, presenting problems, physical findings, and medications, reviewed with resident physician Dr Jessica Hoffman on the day of the patient visit and I concur with her evaluation and management plan. 

## 2017-02-07 NOTE — Assessment & Plan Note (Signed)
Assessment: Chronic pain from Cervical osteoarthritis on opioid medication Patient is on morphine 30 mg #120 per month. Her pain contract goals are to be able to carry out household chores and get up from bed in the morning.  She states she is able to accomplish these goals with her current medication regimen.     UDS at previous clinic visit reported ethyl glucuronide and ethyl sulfate.  I told this to the patient and she reported she had a drink of alcohol at dinner with her friend the night before seeing me.  I advised not combining alcohol when taking her narcotic medications due to potential harmful side effects.  She expressed understanding.  We discussed the use of narcan and patient agreed to prescription.    Plan - 3 separate prescriptions of MSIR 30mg  #120, good through May 21, 2017 - narcan prescription

## 2017-02-11 ENCOUNTER — Telehealth: Payer: Self-pay | Admitting: *Deleted

## 2017-02-11 NOTE — Telephone Encounter (Signed)
Call to Medicare RX D at [phone] for PA for Narcan 4 MG/0.1 ML.  Spoke with Edmonia Lynch pt will need to have a Need  exceptionnto be able to get 4 spyas per month.  Forms are being faxed to the Clinics to be repeated by Dr. Kalman Shan and completed within 72 hours. Sander Nephew, RN 02/11/2017 4:52 PM

## 2017-02-12 ENCOUNTER — Other Ambulatory Visit: Payer: Self-pay | Admitting: Internal Medicine

## 2017-02-12 MED ORDER — NALOXONE HCL 0.4 MG/0.4ML IJ SOAJ: 1.0000 "application " | Freq: Once | INTRAMUSCULAR | 1 refills | Status: AC

## 2017-02-12 NOTE — Telephone Encounter (Signed)
Sent to pharmacy. Thanks

## 2017-02-13 MED ORDER — NALOXONE HCL 0.4 MG/0.4ML IJ SOAJ: 1.0000 | Freq: Once | INTRAMUSCULAR | 1 refills | Status: AC

## 2017-02-20 ENCOUNTER — Other Ambulatory Visit: Payer: Self-pay | Admitting: Internal Medicine

## 2017-02-25 ENCOUNTER — Other Ambulatory Visit: Payer: Self-pay | Admitting: Internal Medicine

## 2017-02-25 NOTE — Telephone Encounter (Signed)
citalopram (CELEXA) 20 MG tablet, refill request @ [facility] on [address].

## 2017-03-05 ENCOUNTER — Other Ambulatory Visit: Payer: Self-pay | Admitting: Internal Medicine

## 2017-03-05 NOTE — Telephone Encounter (Signed)
Please call pt back about med.  

## 2017-03-06 ENCOUNTER — Ambulatory Visit (HOSPITAL_COMMUNITY)
Admission: RE | Admit: 2017-03-06 | Discharge: 2017-03-06 | Disposition: A | Discharge status: Home / Self Care | Payer: PPO | Source: Ambulatory Visit | Attending: Internal Medicine | Admitting: Internal Medicine

## 2017-03-06 ENCOUNTER — Ambulatory Visit (INDEPENDENT_AMBULATORY_CARE_PROVIDER_SITE_OTHER): Payer: PPO | Admitting: Internal Medicine

## 2017-03-06 ENCOUNTER — Encounter: Payer: Self-pay | Admitting: Internal Medicine

## 2017-03-06 ENCOUNTER — Other Ambulatory Visit: Payer: Self-pay | Admitting: Internal Medicine

## 2017-03-06 VITALS — BP 107/66 | HR 64 | Temp 97.9°F | Ht 65.0 in | Wt 142.8 lb

## 2017-03-06 DIAGNOSIS — M79671 Pain in right foot: Secondary | ICD-10-CM | POA: Diagnosis not present

## 2017-03-06 DIAGNOSIS — S99921A Unspecified injury of right foot, initial encounter: Secondary | ICD-10-CM | POA: Diagnosis not present

## 2017-03-06 DIAGNOSIS — W57XXXA Bitten or stung by nonvenomous insect and other nonvenomous arthropods, initial encounter: Secondary | ICD-10-CM | POA: Insufficient documentation

## 2017-03-06 DIAGNOSIS — M19071 Primary osteoarthritis, right ankle and foot: Secondary | ICD-10-CM | POA: Insufficient documentation

## 2017-03-06 DIAGNOSIS — S40861A Insect bite (nonvenomous) of right upper arm, initial encounter: Secondary | ICD-10-CM

## 2017-03-06 NOTE — Patient Instructions (Addendum)
Julie Williams,  Please get your x-ray of your right foot today. I will call you with the results. Please continue to monitor your insect bite on your left arm. If symptoms change or If you develop fever/chills, abscess or tenderness to the area please call the clinic.

## 2017-03-06 NOTE — Progress Notes (Signed)
Provided replacement post op shoe for right foot.  pt tolerated application very well.  Right foot.

## 2017-03-06 NOTE — Assessment & Plan Note (Signed)
Assessment: Right foot pain Patient presents with injury to her the dorsum of her right foot after a fall.  Will get an x-ray of her right foot to assess for fracture as she has point tenderness to the dorsum of foot and pain is worse with weight bearing.  Also asked ortho tech to help with boot to help with ambulation and pain relief.  Plan -right foot x-ray - ortho tech for boot
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
## 2017-03-06 NOTE — Assessment & Plan Note (Signed)
Assessment:  Insect bite on left upper arm Patient presents with recent insect bite.  Patient has taken oral benadryl and used hydrocortisone cream and alcohol on area.  The area has a solid urticaria wheal that is about 4in in diameter.  No signs of infection.  No abscess formation.  No bull's eye appearance.  She denies headaches, rash, fever/chills or tenderness to area.  Told patient to monitor and if developed symptoms noted above to call the clinic  Plan -continue to monitor - return precautions given

## 2017-03-07 NOTE — Progress Notes (Signed)
Internal Medicine Clinic Attending  Case discussed with Dr. Hoffman at the time of the visit.  We reviewed the resident's history and exam and pertinent patient test results.  I agree with the assessment, diagnosis, and plan of care documented in the resident's note.  

## 2017-04-21 ENCOUNTER — Ambulatory Visit (INDEPENDENT_AMBULATORY_CARE_PROVIDER_SITE_OTHER): Payer: PPO | Admitting: Internal Medicine

## 2017-04-21 VITALS — BP 129/77 | HR 61 | Temp 97.7°F | Ht 65.0 in | Wt 142.8 lb

## 2017-04-21 DIAGNOSIS — Z87891 Personal history of nicotine dependence: Secondary | ICD-10-CM

## 2017-04-21 DIAGNOSIS — H00014 Hordeolum externum left upper eyelid: Secondary | ICD-10-CM

## 2017-04-21 NOTE — Patient Instructions (Signed)
Julie Williams it was nice meeting you today.   Do warm compresses 4 times a day; 15 minutes each time.   You may use over-the-counter lubricant eyedrops if needed for comfort.  Do not wear any eye makeup for the next 7 days.   The pain and swelling should resolve within the next 7 days. Please return to the clinic if there is no improvement or if the pain and/ or swelling become worse.     Stye A stye is a bump on your eyelid caused by a bacterial infection. A stye can form inside the eyelid (internal stye) or outside the eyelid (external stye). An internal stye may be caused by an infected oil-producing gland inside your eyelid. An external stye may be caused by an infection at the base of your eyelash (hair follicle). Styes are very common. Anyone can get them at any age. They usually occur in just one eye, but you may have more than one in either eye. What are the causes? The infection is almost always caused by bacteria called Staphylococcus aureus. This is a common type of bacteria that lives on your skin. What increases the risk? You may be at higher risk for a stye if you have had one before. You may also be at higher risk if you have:  Diabetes.  Long-term illness.  Long-term eye redness.  A skin condition called seborrhea.  High fat levels in your blood (lipids).  What are the signs or symptoms? Eyelid pain is the most common symptom of a stye. Internal styes are more painful than external styes. Other signs and symptoms may include:  Painful swelling of your eyelid.  A scratchy feeling in your eye.  Tearing and redness of your eye.  Pus draining from the stye.  How is this diagnosed? Your health care provider may be able to diagnose a stye just by examining your eye. The health care provider may also check to make sure:  You do not have a fever or other signs of a more serious infection.  The infection has not spread to other parts of your eye or areas around your  eye.  How is this treated? Most styes will clear up in a few days without treatment. In some cases, you may need to use antibiotic drops or ointment to prevent infection. Your health care provider may have to drain the stye surgically if your stye is:  Large.  Causing a lot of pain.  Interfering with your vision.  This can be done using a thin blade or a needle. Follow these instructions at home:  Take medicines only as directed by your health care provider.  Apply a clean, warm compress to your eye for 10 minutes, 4 times a day.  Do not wear contact lenses or eye makeup until your stye has healed.  Do not try to pop or drain the stye. Contact a health care provider if:  You have chills or a fever.  Your stye does not go away after several days.  Your stye affects your vision.  Your eyeball becomes swollen, red, or painful. This information is not intended to replace advice given to you by your health care provider. Make sure you discuss any questions you have with your health care provider. Document Released: 05/22/2005 Document Revised: 04/07/2016 Document Reviewed: 11/26/2013 Elsevier Interactive Patient Education  [employer].

## 2017-04-22 ENCOUNTER — Telehealth: Payer: Self-pay

## 2017-04-22 DIAGNOSIS — H00014 Hordeolum externum left upper eyelid: Secondary | ICD-10-CM | POA: Insufficient documentation

## 2017-04-22 DIAGNOSIS — H00024 Hordeolum internum left upper eyelid: Secondary | ICD-10-CM | POA: Diagnosis not present

## 2017-04-22 NOTE — Telephone Encounter (Signed)
Pt was angry when I rtc, stated she's tired of nobody being concerned and the doctors not knowing what they are doing, she states that nobody does what they are supposed to in the clinic, states she is on her way to an opthamologist to find out whats really wrong, states her eye is more swollen and red today Ask her to call back and let triage know what the opthamologist said

## 2017-04-22 NOTE — Assessment & Plan Note (Signed)
History of present illness Patient is presenting with a one-day history of pain and swelling of her left upper eyelid. States she experiences pain when blinking or bending forward. She first noticed thus problem when waking up in the morning. She wears eye makeup twice a week on average. Denies having any trauma to her eyes. Denies having any itching. She has not noticed any drainage from the area and her vision is not affected. Denies any prior history of eye problems. Denies having any headaches, nausea, or vomiting.  Assessment Hordeolum of the left upper eyelid noted. Vision and extraocular movements intact. No conjunctival injection or discharge. No signs of orbital or preseptal cellulitis.  Plan -Advised her to do warm compresses 4 times a day; 15 minutes each time -Lubricant eyedrops if needed for discomfort -Avoid wearing eye makeup for the next 1 week -Advised her to return to the clinic if there's no resolution or worsening of symptoms in the next 7 days.

## 2017-04-22 NOTE — Telephone Encounter (Signed)
Patient was seen in [facility] yesterday and advised to return to the clinic if there was no improvement in her symptoms in the next 7 days. If she believes her eyelid is significantly more swollen despite following recommendations, she should return to the clinic sooner. Thanks.

## 2017-04-22 NOTE — Progress Notes (Signed)
Internal Medicine Clinic Attending  I saw and evaluated the patient.  I personally confirmed the key portions of the history and exam documented by Dr. Rathore and I reviewed pertinent patient test results.  The assessment, diagnosis, and plan were formulated together and I agree with the documentation in the resident's note.  
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
## 2017-04-22 NOTE — Telephone Encounter (Signed)
Pt states her eyes are not getting better, requesting to speak with a nurse. Please call pt back.

## 2017-05-02 DIAGNOSIS — H00024 Hordeolum internum left upper eyelid: Secondary | ICD-10-CM | POA: Diagnosis not present

## 2017-05-08 ENCOUNTER — Encounter: Payer: PPO | Admitting: Internal Medicine

## 2017-05-08 ENCOUNTER — Other Ambulatory Visit: Payer: Self-pay | Admitting: Internal Medicine

## 2017-05-08 DIAGNOSIS — Z79891 Long term (current) use of opiate analgesic: Secondary | ICD-10-CM

## 2017-05-08 NOTE — Telephone Encounter (Signed)
Patient called to cancel today's appointment with Dr. Kalman Shan and was rescheduled for next available date on 05/22/17 at 3:45 pm.  Will run out of her morphine (MSIR) 30 MG tablet on 05/19/2017.  Informed patient would send message to triage for refill and make note if any one cancels for Dr. Heber Elgin on 05/15/17 would give her a call.

## 2017-05-16 MED ORDER — MORPHINE SULFATE 30 MG PO TABS: ORAL_TABLET | ORAL | 0 refills | Status: DC

## 2017-05-19 ENCOUNTER — Telehealth: Payer: Self-pay | Admitting: Internal Medicine

## 2017-05-19 NOTE — Telephone Encounter (Signed)
Pls called after 215pm regarding medicine

## 2017-05-19 NOTE — Telephone Encounter (Signed)
Pt picked up script

## 2017-05-19 NOTE — Telephone Encounter (Signed)
Called, no answer.

## 2017-05-22 ENCOUNTER — Encounter: Payer: PPO | Admitting: Internal Medicine

## 2017-06-12 ENCOUNTER — Encounter: Payer: Self-pay | Admitting: Internal Medicine

## 2017-06-12 ENCOUNTER — Other Ambulatory Visit (HOSPITAL_COMMUNITY)
Admission: RE | Admit: 2017-06-12 | Discharge: 2017-06-12 | Disposition: A | Discharge status: Home / Self Care | Payer: PPO | Source: Ambulatory Visit | Attending: Internal Medicine | Admitting: Internal Medicine

## 2017-06-12 ENCOUNTER — Ambulatory Visit (INDEPENDENT_AMBULATORY_CARE_PROVIDER_SITE_OTHER): Payer: PPO | Admitting: Internal Medicine

## 2017-06-12 VITALS — BP 132/89 | HR 74 | Temp 98.5°F | Wt 141.9 lb

## 2017-06-12 DIAGNOSIS — Z0000 Encounter for general adult medical examination without abnormal findings: Secondary | ICD-10-CM | POA: Insufficient documentation

## 2017-06-12 DIAGNOSIS — Z23 Encounter for immunization: Secondary | ICD-10-CM

## 2017-06-12 DIAGNOSIS — G8929 Other chronic pain: Secondary | ICD-10-CM | POA: Diagnosis not present

## 2017-06-12 DIAGNOSIS — M47812 Spondylosis without myelopathy or radiculopathy, cervical region: Secondary | ICD-10-CM

## 2017-06-12 DIAGNOSIS — Z79891 Long term (current) use of opiate analgesic: Secondary | ICD-10-CM | POA: Diagnosis not present

## 2017-06-12 DIAGNOSIS — Z79899 Other long term (current) drug therapy: Secondary | ICD-10-CM

## 2017-06-12 DIAGNOSIS — F339 Major depressive disorder, recurrent, unspecified: Secondary | ICD-10-CM

## 2017-06-12 DIAGNOSIS — F330 Major depressive disorder, recurrent, mild: Secondary | ICD-10-CM

## 2017-06-12 DIAGNOSIS — Z114 Encounter for screening for human immunodeficiency virus [HIV]: Secondary | ICD-10-CM | POA: Diagnosis not present

## 2017-06-12 MED ORDER — MORPHINE SULFATE 30 MG PO TABS: ORAL_TABLET | ORAL | 0 refills | Status: DC

## 2017-06-12 MED ORDER — CITALOPRAM HYDROBROMIDE 20 MG PO TABS: 20.0000 mg | ORAL_TABLET | Freq: Every day | ORAL | 11 refills | Status: DC

## 2017-06-12 MED ORDER — OMEPRAZOLE 40 MG PO CPDR: 40.0000 mg | DELAYED_RELEASE_CAPSULE | Freq: Every day | ORAL | 3 refills | Status: DC

## 2017-06-12 MED ORDER — POLYETHYLENE GLYCOL 3350 17 GM/SCOOP PO POWD: ORAL | 2 refills | Status: DC

## 2017-06-12 NOTE — Patient Instructions (Signed)
Julie Williams,  It was a pleasure seeing you today. Please follow-up in 3 months. In the meantime please was at the acute care clinic for any acute medical needs.
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
## 2017-06-13 LAB — HIV ANTIBODY (ROUTINE TESTING W REFLEX): HIV Screen 4th Generation wRfx: NONREACTIVE

## 2017-06-13 LAB — URINE CYTOLOGY ANCILLARY ONLY
Chlamydia: NEGATIVE
NEISSERIA GONORRHEA: NEGATIVE

## 2017-06-14 NOTE — Assessment & Plan Note (Signed)
Assessment: Major Depressive Disorder Patient reports compliance with Celexa 20 mg. Her PHQ-2 was 0.  She states that she feels well and believes the current dose is adequate in helping with her depressive symptoms.    Plan: -Continue Celexa 20 mg - Reassess PHQ-9 on next visit

## 2017-06-14 NOTE — Assessment & Plan Note (Addendum)
Assessment: Healthcare maintenance Patient reports starting a new relationship and wants to be tested for STDs and HIV. Will order gonorrhea/chlamydia urine and HIV antibody Patient also requested influenza vaccine  Plan -Gonorrhea/chlamydia urine -HIV antibody -Influenza vaccine in office

## 2017-06-14 NOTE — Assessment & Plan Note (Signed)
Assessment: Chronic pain from Cervical osteoarthritis on opioid medication Patient is on morphine 30 mg #120 per month. She states she is able to accomplish her IDLs with her current medication regimen.  Plan - 2 paper prescriptions given, good through  ~08/15/17

## 2017-06-16 ENCOUNTER — Encounter: Payer: Self-pay | Admitting: Internal Medicine

## 2017-06-22 NOTE — Progress Notes (Signed)
Internal Medicine Clinic Attending  Case discussed with Dr. Hoffman  at the time of the visit.  We reviewed the resident's history and exam and pertinent patient test results.  I agree with the assessment, diagnosis, and plan of care documented in the resident's note.  Alexander N Raines, MD   

## 2017-06-30 ENCOUNTER — Encounter (HOSPITAL_COMMUNITY): Payer: Self-pay

## 2017-08-13 ENCOUNTER — Telehealth: Payer: Self-pay

## 2017-08-13 NOTE — Telephone Encounter (Signed)
Would like to speak with a nurse. Per patient she would like a call back after 3:30. Please call back.

## 2017-08-14 NOTE — Telephone Encounter (Signed)
Pt is calling about being double billed, I ask her to call the ph# listed on the bill , she states she has numerous times and they were rude and told her she just had to pay, states she has had several calls 3 way with insurance co and billing with no luck, states she spoke w/ someone els and the person told her to send the original bill to her and she would look at it, she told the person she would not send the original bill. She is tired of this she states she is being charged a double copay, I told her repeatedly that I cannot help with billing, will ask sharon powers Soil scientist of internal med clinic to address

## 2017-08-15 NOTE — Telephone Encounter (Signed)
I called pt today to f/u on her complaint calls to our financial counselor, Ms Berdine Addison, then our Engineer, building services (PA), Ms. Elisabeth Cara, and then our triage nurse, Ms Buena Irish. The calls to Ms Berdine Addison and Ms Elisabeth Cara did not get documented when they occurred yesteraday; however both staff have attested to the fact that when the pt called to complain about getting a bill for 2 co-pays for the 3rd time this year when she came in October, they both told her to call the Accounting Dept. The pt states she did call accounting and "no one would do anything." When she gave this accounting to Ms Stafford, Ms Berdine Addison stated she asked pt to call Accounting again as no employees here in the clinic could see her bill or make any adjustments. Ms Berdine Addison stated the pt asked for her director, at which point Ms Berdine Addison gave the PA"s phone number. Ms Elisabeth Cara, [place], spoke to the pt in my presence yesterday and told her she would forward her complaint to the manager of the pt accounting dept to see what could be done about her duplicate $97 co-pay bill but that the holidays might delay a response and it might be the first week or 2nd week in January before she could get a response from a pt Biochemist, clinical.  The pt told me today that she had just heard from her insurance company and that the matter had been resolved but she wanted to know what went wrong and how to stop a recurrence since, in her perspective, this was the 3rd time this year she had been "double billed." I apologized for the frustration the billing situation had caused her and for the apparent recurrence of a billing matter she thought had already been resolved. I checked w/ Ms Elisabeth Cara and she had already emailed this pt's concerns and contact info to the pt Biochemist, clinical. The pt reiterated her story approximately 3-4 times and each time I apologized the matter took so long to be corrected this time and that the only recourse we had was to contact the pt Biochemist, clinical as Ms  Elisabeth Cara had already done. I explained we were doing all we knew to do to help her f/u with an accounting/billing issue and apologized that the accounting dept had "threatened to turn her in to a bill collector", offering that the pt Biochemist, clinical might have an opportunity to coach some of the staff about pt. Perspectives and needs. After repeating her concerns r/t preventing recurrence and repeating she might have to leave our practice if the matter was not permanently resolved, the pt stated "ok" when I said we would hope we could resolve the issue to her satisfaction thru pt accounting and she certainly always had the option to move to another practice if she felt it was better for her to do so.

## 2017-08-18 ENCOUNTER — Telehealth: Payer: Self-pay | Admitting: Internal Medicine

## 2017-08-18 NOTE — Telephone Encounter (Signed)
   Reason for call:   I received [facility] from [facility] in regards to Ms. [name] at 2:35 PM indicating need for refill on chronic morphine.   Pertinent Data:   Per pharmacist, she needs prior authorization and authorization and visit from primary prescriber with attending DEA number.   Assessment / Plan / Recommendations:    Patient was still in pharmacy at time of call; I advised he ask her to come to clinic 12/27 when we would be open as these issues cannot be addressed at this time due to clinic closure for holiday. He agreed and stated he has already discussed this with her but will relay the message again.  Will send message to front desk for scheduling.   Alphonzo Grieve, MD   08/18/2017, 2:49 PM

## 2017-08-21 ENCOUNTER — Telehealth: Payer: Self-pay | Admitting: Internal Medicine

## 2017-08-21 NOTE — Telephone Encounter (Signed)
Pt called again, stating the doctor needs to call the [facility] to authorize morphine (MSIR) 30 MG tablet. Please call pt back.

## 2017-08-21 NOTE — Telephone Encounter (Signed)
Talked to Fallon at [facility] - stated when pt picked up Morphine rx she had to pay $100.00 instead of $8.00 ; her insurance would not pay. Needs prior authorization for qty of 120. She stated this is still new some ins co are doing. I will message to Sander Nephew for PA also her doctor.

## 2017-08-21 NOTE — Telephone Encounter (Signed)
Patient called this morning stating her doctor needs to call [id number] [address], [place]. [phone] (Phone) [phone] (Fax)   to authorize her pain medication. Please get in touch with the patient or the pharmacy.

## 2017-08-22 NOTE — Telephone Encounter (Signed)
Requesting to speak with a nurse. Please call pt back.  

## 2017-08-27 ENCOUNTER — Telehealth: Payer: Self-pay | Admitting: *Deleted

## 2017-08-27 NOTE — Telephone Encounter (Signed)
Call to [facility] for PA for MSIR.  Patient's claim was denied on 08/18/2017 and had called the clinics for PA and return of $100.00 that she had paid out.  Spoke wit Shirlean Mylar who said that claim was denied due to [place] number.  Corrections have been made to problem and claim was then resubmitted on 08/22/2017 and patient was charged $8.35 and the deference was refunded to the patient.  Test claim was run today to make sure patient's medication will now be covered.  [facility] who stated that patient has indeed been refunded her monies.  Sander Nephew, RN 08/27/2017 9:36 AM.

## 2017-09-04 ENCOUNTER — Encounter: Payer: Self-pay | Admitting: Internal Medicine

## 2017-09-04 ENCOUNTER — Ambulatory Visit (INDEPENDENT_AMBULATORY_CARE_PROVIDER_SITE_OTHER): Payer: PPO | Admitting: Internal Medicine

## 2017-09-04 VITALS — BP 117/90 | HR 63 | Temp 97.8°F | Ht 65.0 in | Wt 145.1 lb

## 2017-09-04 DIAGNOSIS — Z79891 Long term (current) use of opiate analgesic: Secondary | ICD-10-CM

## 2017-09-04 DIAGNOSIS — G8929 Other chronic pain: Secondary | ICD-10-CM

## 2017-09-04 DIAGNOSIS — M47892 Other spondylosis, cervical region: Secondary | ICD-10-CM

## 2017-09-04 DIAGNOSIS — F339 Major depressive disorder, recurrent, unspecified: Secondary | ICD-10-CM

## 2017-09-04 DIAGNOSIS — Z5329 Procedure and treatment not carried out because of patient's decision for other reasons: Secondary | ICD-10-CM

## 2017-09-04 DIAGNOSIS — Z79899 Other long term (current) drug therapy: Secondary | ICD-10-CM | POA: Diagnosis not present

## 2017-09-04 DIAGNOSIS — F330 Major depressive disorder, recurrent, mild: Secondary | ICD-10-CM

## 2017-09-04 DIAGNOSIS — M75111 Incomplete rotator cuff tear or rupture of right shoulder, not specified as traumatic: Secondary | ICD-10-CM

## 2017-09-04 DIAGNOSIS — R451 Restlessness and agitation: Secondary | ICD-10-CM

## 2017-09-04 DIAGNOSIS — M75101 Unspecified rotator cuff tear or rupture of right shoulder, not specified as traumatic: Secondary | ICD-10-CM

## 2017-09-04 MED ORDER — MORPHINE SULFATE 30 MG PO TABS: ORAL_TABLET | ORAL | 0 refills | Status: DC
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
## 2017-09-04 NOTE — Progress Notes (Signed)
Upon asking clinical intake questions that are required, patient became frustrated & in a raised voice said " do we have to do this over & over again? Why do you all have to ask these questions everytime I come here"   I explained that it is a part of the required questions & mainly for patient safety.  Then she said " I've had it with this place, I don't know why it's so hard for me to get my pills, why I have to come here & get tested like I'm some kind of druggie. And why do I have to pay double copay"   She said she has been in the parking lot since 1 pm & has been waiting in the front office since 2 pm. I explained her appt is not due til 2:45 & the doctor is with the patient that is scheduled before her.

## 2017-09-04 NOTE — Patient Instructions (Signed)
Julie Williams,  Please follow up in 3 months.

## 2017-09-07 DIAGNOSIS — R451 Restlessness and agitation: Secondary | ICD-10-CM

## 2017-09-07 HISTORY — DX: Restlessness and agitation: R45.1

## 2017-09-07 NOTE — Assessment & Plan Note (Signed)
Assessment: Chronic pain from Cervical osteoarthritis on opioid medication Patient is on morphine 30 mg #120 per month. She states she is able to accomplish her IDLs with her current medication regimen.  Plan - UDS - 3 paper prescriptions good through 12/17/17 (first prescription starts 09/18/17)

## 2017-09-07 NOTE — Assessment & Plan Note (Signed)
Assessment:  Agitation  Patient was visibly upset throughout the interview.  Patient's main concern today was how she was being billed by [facility].  I recommended she talk to the billing department.  At one point she thought she was being videotaped and asked what a red light in the room was for.  I walked over to the red light and noted it was an electrical outlet in the room.  Patient was also upset she had to give a urine sample and I explained it was part of the pain contract she had signed.     Plan -assess if patient would like to follow with a pain clinic at next visit

## 2017-09-07 NOTE — Assessment & Plan Note (Signed)
Assessment: Major Depressive Disorder Patient reports compliance with Celexa 20 mg. Attempted PHq-9 questions however patient states she was not answering truthfully and told me to read her body language in order to make a decision on her answers.  Will continue current medications since unable to get accurate results.    Plan - Continue Celexa 20 mg - Reassess PHQ-9 on next visit

## 2017-09-07 NOTE — Assessment & Plan Note (Signed)
Assessment:  Right Shoulder pain 2.5 months ago patient stated she started noticing increasing pain on abduction of her right arm.  She Denies trauma to the area.  Patient refused focused physical exam to right shoulder and arm.  Patient has a history of partial thickness tear of infraspinatus muscle in 2013.  Will refer to orthopedics.  Plan -orthopedic surgery referral

## 2017-09-08 NOTE — Progress Notes (Signed)
Internal Medicine Clinic Attending  Case discussed with Dr. Hoffman at the time of the visit.  We reviewed the resident's history and exam and pertinent patient test results.  I agree with the assessment, diagnosis, and plan of care documented in the resident's note.  

## 2017-09-13 LAB — TOXASSURE SELECT,+ANTIDEPR,UR

## 2017-09-16 ENCOUNTER — Other Ambulatory Visit: Payer: Self-pay | Admitting: Internal Medicine

## 2017-09-16 ENCOUNTER — Encounter: Payer: Self-pay | Admitting: *Deleted

## 2017-09-16 DIAGNOSIS — Z139 Encounter for screening, unspecified: Secondary | ICD-10-CM

## 2017-09-18 ENCOUNTER — Telehealth: Payer: Self-pay | Admitting: Internal Medicine

## 2017-09-18 ENCOUNTER — Telehealth: Payer: Self-pay | Admitting: *Deleted

## 2017-09-18 ENCOUNTER — Other Ambulatory Visit: Payer: Self-pay | Admitting: Internal Medicine

## 2017-09-18 MED ORDER — POLYETHYLENE GLYCOL 3350 17 GM/SCOOP PO POWD: ORAL | 6 refills | Status: DC

## 2017-09-18 NOTE — Telephone Encounter (Signed)
Patient said her prescription has a physician has the id number for the hospital not from internal medicine, the pharmacy isn't filing her prescription. Pls call patient

## 2017-09-18 NOTE — Telephone Encounter (Signed)
Fax from [facility] - states Miralax [phone] is no longer available but is now available in 119 gm bottles. And pt requesting new rx so her insurance can pay for it. Thanks

## 2017-09-18 NOTE — Telephone Encounter (Signed)
Ok done

## 2017-09-18 NOTE — Telephone Encounter (Signed)
Call from patient has had a problem getting her Miralax per prescription.  Patient wanted to know if their was a generic for or other substitute for that would be covered by her insurance. Patient also said that she has a problem with her prescriptions when they are done on the inpatient side and not in the Clinics.  Clarified with Dr. Magdalene River the prescriptions are the same.  Patient also stated that she had a problem getting her MSIR again this month.  Wanted to get the number to the [facility] to check on the price of her medications.   Was given the number for.  [facility] her MSIR today with no problem with her insurance. [place] was also told that her insurance will no longer pat for Miralex or medications like it.  As they do the same as OTC.  Sander Nephew, RN 09/18/2017 5:03 PM.

## 2017-09-18 NOTE — Telephone Encounter (Signed)
Gladysh. Has addressed this

## 2017-10-06 ENCOUNTER — Ambulatory Visit
Admission: RE | Admit: 2017-10-06 | Discharge: 2017-10-06 | Disposition: A | Discharge status: Home / Self Care | Payer: PPO | Source: Ambulatory Visit | Attending: Internal Medicine | Admitting: Internal Medicine

## 2017-10-06 DIAGNOSIS — Z1231 Encounter for screening mammogram for malignant neoplasm of breast: Secondary | ICD-10-CM | POA: Diagnosis not present

## 2017-10-06 DIAGNOSIS — Z139 Encounter for screening, unspecified: Secondary | ICD-10-CM

## 2017-10-24 DIAGNOSIS — M25519 Pain in unspecified shoulder: Secondary | ICD-10-CM | POA: Diagnosis not present

## 2017-10-24 DIAGNOSIS — M67439 Ganglion, unspecified wrist: Secondary | ICD-10-CM | POA: Diagnosis not present

## 2017-10-24 DIAGNOSIS — M67431 Ganglion, right wrist: Secondary | ICD-10-CM | POA: Diagnosis not present

## 2017-10-24 DIAGNOSIS — M25511 Pain in right shoulder: Secondary | ICD-10-CM | POA: Diagnosis not present

## 2017-11-01 DIAGNOSIS — M7541 Impingement syndrome of right shoulder: Secondary | ICD-10-CM | POA: Diagnosis not present

## 2017-11-06 ENCOUNTER — Telehealth: Payer: Self-pay | Admitting: Internal Medicine

## 2017-11-06 DIAGNOSIS — M7541 Impingement syndrome of right shoulder: Secondary | ICD-10-CM | POA: Diagnosis not present

## 2017-11-06 DIAGNOSIS — M19011 Primary osteoarthritis, right shoulder: Secondary | ICD-10-CM | POA: Diagnosis not present

## 2017-11-06 DIAGNOSIS — M75121 Complete rotator cuff tear or rupture of right shoulder, not specified as traumatic: Secondary | ICD-10-CM | POA: Diagnosis not present

## 2017-11-06 NOTE — Telephone Encounter (Signed)
Patient is wanting to increase her morphine, she said that Dr Victorino December said that she is in a lot of pain and need it increase, Dr . Stann Mainland is faxing over a letter to prove that.

## 2017-11-07 NOTE — Telephone Encounter (Signed)
OK.  I will look over the letter.

## 2017-11-10 DIAGNOSIS — M19011 Primary osteoarthritis, right shoulder: Secondary | ICD-10-CM

## 2017-11-10 DIAGNOSIS — M7541 Impingement syndrome of right shoulder: Secondary | ICD-10-CM | POA: Insufficient documentation

## 2017-11-10 HISTORY — DX: Primary osteoarthritis, right shoulder: M19.011

## 2017-11-10 NOTE — Telephone Encounter (Signed)
There is no letter in my mail box stating that the patient needs an increase in morphine.  I did receive a form from orthopedic surgery asking for surgical clearance for [name].  At this time I do not feel comfortable increasing her pain regimen for this chronic issue.  I can refer to a pain clinic if Julie Williams would like further evaluation.

## 2017-11-10 NOTE — Telephone Encounter (Signed)
Requesting increase in Morphine dosage due to recent dx from ortho. Form from [facility] in your mailbox, thank you! appt w/ pcp 12/11/17

## 2017-11-12 NOTE — Telephone Encounter (Signed)
Received call from patient regarding her request to increase Morphine. Pt informed that request has been denied, but her pcp has offered to refer her to a pain clinic for further evaluation.  Per pt's request-will have attending review, as pt feels this decision is unfair.  When asked about her pain- she has pain in her neck, shoulder, and wrist. States she has a 1.5" rotator cuff tear and bone spur that she will be having surgery on.  She also states that she has a penny-size ganglion cyst on her right wrist. At times, pt states she has to take about 2 tabs (instead of the 1/2 to 1 tab prescribed) every 4-6 hours to control her pain.  She also believes that her body has become "immune" to the current morphine dose as it does not work as well as it used to. States her next refill is due on 3/24 and she has offered to bring hard copy back to office to switch out for a new one.  When asked about pain clinic option-pt declines referral.  Pt states that she has been to a pain clinic in the past and does not care to go back.  Will send info to both attending pool and pcp for additional review.  Please advise Julie Hidden Cassady3/20/201911:12 AM

## 2017-11-12 NOTE — Telephone Encounter (Signed)
I have reviewed the request and I agree with Dr. Heber [place]. She has seen the patient recently (in January) and has assessed her at that time and believes that this is the appropriate regimen for the patient. I would not recommend an increase in pain medication at this time.

## 2017-11-20 DIAGNOSIS — M67439 Ganglion, unspecified wrist: Secondary | ICD-10-CM | POA: Diagnosis not present

## 2017-11-20 DIAGNOSIS — M67431 Ganglion, right wrist: Secondary | ICD-10-CM | POA: Diagnosis not present

## 2017-12-11 ENCOUNTER — Encounter (INDEPENDENT_AMBULATORY_CARE_PROVIDER_SITE_OTHER): Payer: Self-pay

## 2017-12-11 ENCOUNTER — Ambulatory Visit (INDEPENDENT_AMBULATORY_CARE_PROVIDER_SITE_OTHER): Payer: PPO | Admitting: Internal Medicine

## 2017-12-11 ENCOUNTER — Encounter: Payer: Self-pay | Admitting: Internal Medicine

## 2017-12-11 DIAGNOSIS — M479 Spondylosis, unspecified: Secondary | ICD-10-CM

## 2017-12-11 DIAGNOSIS — Z79891 Long term (current) use of opiate analgesic: Secondary | ICD-10-CM | POA: Diagnosis not present

## 2017-12-11 DIAGNOSIS — G8929 Other chronic pain: Secondary | ICD-10-CM | POA: Diagnosis not present

## 2017-12-11 DIAGNOSIS — F330 Major depressive disorder, recurrent, mild: Secondary | ICD-10-CM

## 2017-12-11 DIAGNOSIS — R079 Chest pain, unspecified: Secondary | ICD-10-CM

## 2017-12-11 DIAGNOSIS — Z87891 Personal history of nicotine dependence: Secondary | ICD-10-CM

## 2017-12-11 DIAGNOSIS — Z79899 Other long term (current) drug therapy: Secondary | ICD-10-CM | POA: Diagnosis not present

## 2017-12-11 DIAGNOSIS — F339 Major depressive disorder, recurrent, unspecified: Secondary | ICD-10-CM

## 2017-12-11 MED ORDER — MORPHINE SULFATE 30 MG PO TABS: ORAL_TABLET | ORAL | 0 refills | Status: DC

## 2017-12-11 NOTE — Patient Instructions (Signed)
Julie Williams,  Please follow up in 3 months.  A referral to Cardiology has been made.
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
## 2017-12-14 DIAGNOSIS — R079 Chest pain, unspecified: Secondary | ICD-10-CM | POA: Insufficient documentation

## 2017-12-14 HISTORY — DX: Chest pain, unspecified: R07.9

## 2017-12-14 NOTE — Assessment & Plan Note (Signed)
Assessment: Chronic pain from Cervical osteoarthritis on opioid medication Patient is on morphine 30 mg #120 per month. She states she is able to accomplish her IDLs with her current medication regimen.  Plan - refill morphine 30mg  # 120 per month

## 2017-12-14 NOTE — Assessment & Plan Note (Signed)
Assessment:  Chest pain Patient reports a history of chest pain 2 times for the past week.  She states that the episodes last for 15-20 minutes and resolve with the use of aspirin.  She states these episodes happen suddenly without exertion and have also occurred at night, waking her from sleep.  Story suspicious for GERD, patient already on omeprazole daily.  She states her father has a history of open heart surgery.  She reports a 30-35 pack year smoking history.  She is intermediate risk and think she would benefit from stress test or Cardiac CT for calcium scoring.  Will refer to cardiology  Plan -Cardiology consult

## 2017-12-14 NOTE — Assessment & Plan Note (Addendum)
Assessment:  Major Depressive Disorder Does not complain of depressive symptoms.  States she feels medication is working.  She is currently taking celexa 20mg  daily.  Will continue  Plan -continue celexa 20mg  daily

## 2017-12-15 NOTE — Progress Notes (Signed)
Internal Medicine Clinic Attending  Case discussed with Dr. Hoffman at the time of the visit.  We reviewed the resident's history and exam and pertinent patient test results.  I agree with the assessment, diagnosis, and plan of care documented in the resident's note.  

## 2018-01-17 ENCOUNTER — Telehealth: Payer: Self-pay | Admitting: Internal Medicine

## 2018-01-17 DIAGNOSIS — Z79891 Long term (current) use of opiate analgesic: Secondary | ICD-10-CM

## 2018-01-17 MED ORDER — MORPHINE SULFATE 30 MG PO TABS: ORAL_TABLET | ORAL | 0 refills | Status: DC

## 2018-01-17 NOTE — Telephone Encounter (Signed)
   Reason for call:   I received a call from Ms. Sabrin L Caspers at 9:15 AM indicating she is due for a refill of her morphine that was refilled last month, but the pharmacy only received prescription for 1 month and does not have refills on file for her.   Pertinent Data:   See by Dr. Kalman Shan on 4/21. She is on chronic morphine for cervical osteomyelitis.   Checked [facility] [place] database, last filled 4/25.   Assessment / Plan / Recommendations:   Sent [facility] Morphine 30 mg (#120 per month[facility] [address]  As always, pt is advised that if symptoms worsen or new symptoms arise, they should go to an urgent care facility or to to ER for further evaluation.   Velna Ochs, MD   01/17/2018, 9:14 AM

## 2018-02-03 ENCOUNTER — Telehealth: Payer: Self-pay | Admitting: Internal Medicine

## 2018-02-03 NOTE — Telephone Encounter (Signed)
Pt would like a call back in reference to her Ortho Surgery that was cancelled due to not being cleared per her last OV with [facility].  Pt states, "She has has an appointment with Dr.Golan in [place] ,and she does not need a consult> Pt wants to have an order placed for a Stress Test instead."  Please advise if she still needs this appt on 02/23/2018 with their office, as she does not won't to keep it.

## 2018-02-04 NOTE — Telephone Encounter (Signed)
I had cleared the patient for her surgery and sent form to ortho.  Not sure why surgery was canceled.  I am unaware of this.  I referred to cardiology at last visit for atypical chest pain.  I will let them decide what they would like to order.
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
## 2018-02-05 NOTE — Telephone Encounter (Signed)
Patient reported history of chest pain at last visit 4/18 and we discussed a cardiology referral for which patient was agreeable at the time.  At this point patient will need to be cleared by cardiology for surgery.  Thank you for moving up her cardiology appointment.

## 2018-02-05 NOTE — Telephone Encounter (Signed)
I spoke with the patient this morning and she was unhappy and very upset that her surgery was postponed and wanted to know why she needs a Engineer, mining.  [facility] the [facility] and the [facility].  Correspondence should be faxed in reference to the delay of this procedure. Also was able to move this pt's appt up from 02/23/2018 to this Monday 02/09/2018 with [facility] in [place] with Dr. Bettina Gavia @ 3:00pm.  Relayed information to the patient and she verbalized understanding of this information and appointment.

## 2018-02-09 ENCOUNTER — Encounter: Payer: Self-pay | Admitting: Cardiology

## 2018-02-09 ENCOUNTER — Ambulatory Visit (INDEPENDENT_AMBULATORY_CARE_PROVIDER_SITE_OTHER): Payer: PPO | Admitting: Cardiology

## 2018-02-09 VITALS — BP 124/78 | HR 62 | Ht 65.0 in | Wt 144.0 lb

## 2018-02-09 DIAGNOSIS — I10 Essential (primary) hypertension: Secondary | ICD-10-CM | POA: Diagnosis not present

## 2018-02-09 DIAGNOSIS — Z01810 Encounter for preprocedural cardiovascular examination: Secondary | ICD-10-CM | POA: Diagnosis not present

## 2018-02-09 DIAGNOSIS — R079 Chest pain, unspecified: Secondary | ICD-10-CM

## 2018-02-09 NOTE — Patient Instructions (Signed)
Medication Instructions:  Your physician recommends that you continue on your current medications as directed. Please refer to the Current Medication list given to you today.   Labwork: NONE  Testing/Procedures: You had an EKG today  Your physician has requested that you have a stress echocardiogram. For further information please visit [email or web address]. Please follow instruction sheet as given.    Follow-Up: Your physician recommends that you schedule a follow-up appointment as needed or if symptoms worsen or fail to improve   Any Other Special Instructions Will Be Listed Below (If Applicable).     If you need a refill on your cardiac medications before your next appointment, please call your pharmacy.

## 2018-02-10 ENCOUNTER — Telehealth (HOSPITAL_COMMUNITY): Payer: Self-pay | Admitting: *Deleted

## 2018-02-10 NOTE — Telephone Encounter (Signed)
Left message on voicemail in reference to upcoming appointment scheduled for 02/12/18. Phone number given for a call back so details instructions can be given. Julie Williams W   

## 2018-02-10 NOTE — Telephone Encounter (Signed)
Patient given detailed instructions per Stress Test Requisition Sheet for test on 02/12/18 at 7:30.Patient Notified to arrive 30 minutes early, and that it is imperative to arrive on time for appointment to keep from having the test rescheduled.  Patient verbalized understanding. Julie Williams

## 2018-02-12 ENCOUNTER — Ambulatory Visit (HOSPITAL_COMMUNITY): Payer: PPO

## 2018-02-16 ENCOUNTER — Ambulatory Visit (HOSPITAL_COMMUNITY): Payer: PPO | Attending: Cardiology

## 2018-02-16 ENCOUNTER — Ambulatory Visit (HOSPITAL_BASED_OUTPATIENT_CLINIC_OR_DEPARTMENT_OTHER): Payer: PPO

## 2018-02-16 ENCOUNTER — Other Ambulatory Visit: Payer: Self-pay

## 2018-02-16 DIAGNOSIS — R079 Chest pain, unspecified: Secondary | ICD-10-CM

## 2018-02-16 DIAGNOSIS — Z01810 Encounter for preprocedural cardiovascular examination: Secondary | ICD-10-CM

## 2018-02-16 DIAGNOSIS — I10 Essential (primary) hypertension: Secondary | ICD-10-CM | POA: Diagnosis not present

## 2018-02-16 DIAGNOSIS — F17200 Nicotine dependence, unspecified, uncomplicated: Secondary | ICD-10-CM | POA: Insufficient documentation

## 2018-02-16 DIAGNOSIS — Z79891 Long term (current) use of opiate analgesic: Secondary | ICD-10-CM

## 2018-02-16 MED ORDER — MORPHINE SULFATE 30 MG PO TABS: ORAL_TABLET | ORAL | 0 refills | Status: DC

## 2018-02-16 NOTE — Telephone Encounter (Signed)
morphine (MSIR) 30 MG tablet, refill request for July 25. Pt states she needs three refill, please call pt back.

## 2018-02-19 ENCOUNTER — Ambulatory Visit (INDEPENDENT_AMBULATORY_CARE_PROVIDER_SITE_OTHER): Payer: PPO

## 2018-02-19 ENCOUNTER — Encounter: Payer: Self-pay | Admitting: Podiatry

## 2018-02-19 ENCOUNTER — Ambulatory Visit (INDEPENDENT_AMBULATORY_CARE_PROVIDER_SITE_OTHER): Payer: PPO | Admitting: Podiatry

## 2018-02-19 DIAGNOSIS — M779 Enthesopathy, unspecified: Secondary | ICD-10-CM | POA: Diagnosis not present

## 2018-02-19 DIAGNOSIS — M205X1 Other deformities of toe(s) (acquired), right foot: Secondary | ICD-10-CM | POA: Diagnosis not present

## 2018-02-19 DIAGNOSIS — M21611 Bunion of right foot: Secondary | ICD-10-CM

## 2018-02-19 DIAGNOSIS — M21619 Bunion of unspecified foot: Secondary | ICD-10-CM

## 2018-02-19 MED ORDER — TRIAMCINOLONE ACETONIDE 10 MG/ML IJ SUSP
10.0000 mg | Freq: Once | INTRAMUSCULAR | Status: AC
  Administered 2018-02-19: 10 mg
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
## 2018-02-19 NOTE — Patient Instructions (Signed)
The implant that we talked about doing if you do surgery is called "cartiva".

## 2018-02-23 ENCOUNTER — Ambulatory Visit: Payer: PPO | Admitting: Cardiovascular Disease

## 2018-03-30 DIAGNOSIS — G8918 Other acute postprocedural pain: Secondary | ICD-10-CM | POA: Diagnosis not present

## 2018-03-30 DIAGNOSIS — M19011 Primary osteoarthritis, right shoulder: Secondary | ICD-10-CM | POA: Diagnosis not present

## 2018-03-30 DIAGNOSIS — M7581 Other shoulder lesions, right shoulder: Secondary | ICD-10-CM | POA: Diagnosis not present

## 2018-03-30 DIAGNOSIS — M75101 Unspecified rotator cuff tear or rupture of right shoulder, not specified as traumatic: Secondary | ICD-10-CM | POA: Diagnosis not present

## 2018-03-30 DIAGNOSIS — M7541 Impingement syndrome of right shoulder: Secondary | ICD-10-CM | POA: Diagnosis not present

## 2018-03-30 DIAGNOSIS — M75121 Complete rotator cuff tear or rupture of right shoulder, not specified as traumatic: Secondary | ICD-10-CM | POA: Diagnosis not present

## 2018-03-30 DIAGNOSIS — S46191A Other injury of muscle, fascia and tendon of long head of biceps, right arm, initial encounter: Secondary | ICD-10-CM | POA: Diagnosis not present

## 2018-03-31 ENCOUNTER — Telehealth: Payer: Self-pay | Admitting: Internal Medicine

## 2018-03-31 NOTE — Telephone Encounter (Signed)
Called pt - no answer; left message to call the office pertaining to Dr Hoffman's response.

## 2018-03-31 NOTE — Telephone Encounter (Signed)
Pt called back, gave her dr hoffman's response, she is having a very bad cough with wheezing audibly, very tight cough that she states hurts and "takes her breath", she is advised to call 911 and come to ED or have someone drive her, she refuses. She also states that due to the stress of "you all not doing anything this morning I have a migraine and need something for it" "can I take tylenol" she is once again advised to call dr Stann Mainland office or 911 for ED visit. She is advised that she may be seen in [facility] if she would like and refuses. She states "you all just keep throwing me back and forth and dont care" she is advised that yes [facility] cares and is offered the treatment options again, 911, dr rogers or clinic appt and refuses.she states "im going to die and its going to be your fault", she is advised there is nothing that can be done on the ph and offered 911 or clinic appt again. Call ended

## 2018-03-31 NOTE — Telephone Encounter (Signed)
Talked to pt- stated she's having a lot of pain d/t shoulder surgetryand the 1/2 to 1 tab of Morphine is not helping . I asked if the surgeon knows about her pain; stated his office is aware. Stated the pharmacy would not filled the rx given to her by Dr Stann Mainland' office ( unsure of name ?Oxycodone-stated it was called in to the pharmacy) b/c she just refilled her Morphine.  Also c/o sore throat/ coughing up mucous -informed to let his office know about her problems. Stated she had already talked to them and was instructed to call our office to see if her doctor would increase the Morphine.

## 2018-03-31 NOTE — Telephone Encounter (Signed)
Patient will need to follow up with her orthopedic surgeon to be evaluated for her post surgery pain.  She will also need to follow up with Dr. Dennie Maizes office for rx that was declined by pharmacy.  For her sore throat/cough she can be seen in the [facility] or wait until next week when she sees me.  Thanks.

## 2018-03-31 NOTE — Telephone Encounter (Signed)
Agree  Thank you

## 2018-03-31 NOTE — Telephone Encounter (Signed)
Pt is requesting pain medicine, she hasn't been able to breath with pain shoulder. Pt contact# [phone]

## 2018-04-02 ENCOUNTER — Ambulatory Visit: Payer: PPO | Admitting: Podiatry

## 2018-04-07 ENCOUNTER — Encounter (INDEPENDENT_AMBULATORY_CARE_PROVIDER_SITE_OTHER): Payer: Self-pay

## 2018-04-07 ENCOUNTER — Ambulatory Visit (INDEPENDENT_AMBULATORY_CARE_PROVIDER_SITE_OTHER): Payer: PPO | Admitting: Internal Medicine

## 2018-04-07 ENCOUNTER — Encounter: Payer: Self-pay | Admitting: Internal Medicine

## 2018-04-07 DIAGNOSIS — F330 Major depressive disorder, recurrent, mild: Secondary | ICD-10-CM

## 2018-04-07 DIAGNOSIS — Z79899 Other long term (current) drug therapy: Secondary | ICD-10-CM | POA: Diagnosis not present

## 2018-04-07 DIAGNOSIS — G8929 Other chronic pain: Secondary | ICD-10-CM | POA: Diagnosis not present

## 2018-04-07 DIAGNOSIS — Z87891 Personal history of nicotine dependence: Secondary | ICD-10-CM | POA: Diagnosis not present

## 2018-04-07 DIAGNOSIS — M47812 Spondylosis without myelopathy or radiculopathy, cervical region: Secondary | ICD-10-CM

## 2018-04-07 DIAGNOSIS — R05 Cough: Secondary | ICD-10-CM

## 2018-04-07 DIAGNOSIS — R062 Wheezing: Secondary | ICD-10-CM | POA: Diagnosis not present

## 2018-04-07 DIAGNOSIS — Z8739 Personal history of other diseases of the musculoskeletal system and connective tissue: Secondary | ICD-10-CM | POA: Diagnosis not present

## 2018-04-07 DIAGNOSIS — Z79891 Long term (current) use of opiate analgesic: Secondary | ICD-10-CM | POA: Diagnosis not present

## 2018-04-07 MED ORDER — MORPHINE SULFATE 30 MG PO TABS: 30.0000 mg | ORAL_TABLET | ORAL | 0 refills | Status: DC | PRN

## 2018-04-07 MED ORDER — CITALOPRAM HYDROBROMIDE 20 MG PO TABS: 40.0000 mg | ORAL_TABLET | Freq: Every day | ORAL | 11 refills | Status: DC

## 2018-04-07 MED ORDER — ALBUTEROL SULFATE HFA 108 (90 BASE) MCG/ACT IN AERS: 1.0000 | INHALATION_SPRAY | Freq: Four times a day (QID) | RESPIRATORY_TRACT | 1 refills | Status: DC | PRN

## 2018-04-07 MED ORDER — BENZONATATE 100 MG PO CAPS: 100.0000 mg | ORAL_CAPSULE | Freq: Three times a day (TID) | ORAL | 0 refills | Status: DC | PRN
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
## 2018-04-08 ENCOUNTER — Telehealth: Payer: Self-pay | Admitting: Internal Medicine

## 2018-04-08 DIAGNOSIS — R062 Wheezing: Secondary | ICD-10-CM | POA: Insufficient documentation

## 2018-04-08 NOTE — Telephone Encounter (Signed)
Pt insurance would like nurse to call back [phone], option 0, 3 & 3 and reference# [id number]

## 2018-04-08 NOTE — Assessment & Plan Note (Signed)
HPI:  Patient states she developed a cough and wheezing after her surgery for rotator cuff tear.  Patient thinks it is a result of having a breathing tube in place during surgery.  She denies shortness of breath, fever/chills.  States her symptoms are improving slightly.  Denies hx of COPD or asthma.  She does have a 30-35 pack year smoking history. She has never used an inhaler.  Assessment:  Wheezing Wheezing noted on exam along with productive cough with non purulent sputum.  Appears viral. Since symptoms are not worsening will treat with supportive care at this time.  Told patient to call if symptoms worsen.  May need pulmonary evaluation for possible COPD in the future.    Plan     Albuterol inhaler, tessalon pearls

## 2018-04-08 NOTE — Assessment & Plan Note (Signed)
HPI: Patient is on morphine 30 mg #120 per month. She states she is able to accomplish her IDLs with her current medication regimen.  Assessment: Chronic pain from Cervical osteoarthritis on long term use of opioid medication Pain is controlled with current medication regimen  Plan - refill morphine 30mg  # 120 per month, good through ~approx nov 26,2019

## 2018-04-08 NOTE — Assessment & Plan Note (Signed)
HPI:  Today PHQ-9 is 13.  She is currently taking celexa 20mg  daily.    Assessment:  Major Depressive Disorder Symptoms suggesting mild depression with PHQ-9 of 13 today.  Discussed referral to therapist for counseling. Patient declined referral at this time.  Patient with no plan to harm self.  Discussed increasing anti-depressant and patient agreeable at this time.  Plan -increase celexa from 20mg  to 40mg  daily

## 2018-04-10 ENCOUNTER — Other Ambulatory Visit: Payer: Self-pay | Admitting: Internal Medicine

## 2018-04-10 DIAGNOSIS — M25511 Pain in right shoulder: Secondary | ICD-10-CM | POA: Diagnosis not present

## 2018-04-10 NOTE — Progress Notes (Signed)
Internal Medicine Clinic Attending  Case discussed with Dr. Hoffman at the time of the visit.  We reviewed the resident's history and exam and pertinent patient test results.  I agree with the assessment, diagnosis, and plan of care documented in the resident's note.  

## 2018-04-13 ENCOUNTER — Telehealth: Payer: Self-pay | Admitting: Internal Medicine

## 2018-04-13 NOTE — Telephone Encounter (Signed)
[facility] prior authorizations [id number] options 0, 3 & 3  Ref# [id number]

## 2018-04-14 NOTE — Telephone Encounter (Signed)
Saw patient and increased dose and sent prescription to the pharmacy at last visit.  Not sure why there is a refill request.  Any issues?

## 2018-04-14 NOTE — Telephone Encounter (Signed)
[facility] prior authorizations [id number] options 0,3&3  TRR#[id number]   Pls contact back

## 2018-04-15 ENCOUNTER — Telehealth: Payer: Self-pay | Admitting: Internal Medicine

## 2018-04-15 NOTE — Telephone Encounter (Signed)
Debra from [place] calling about prior authorization, have not heard anything. pls call ASAP [phone]

## 2018-04-19 ENCOUNTER — Other Ambulatory Visit: Payer: Self-pay | Admitting: Internal Medicine

## 2018-04-23 ENCOUNTER — Other Ambulatory Visit: Payer: Self-pay | Admitting: Internal Medicine

## 2018-04-23 DIAGNOSIS — M25511 Pain in right shoulder: Secondary | ICD-10-CM | POA: Diagnosis not present

## 2018-04-30 DIAGNOSIS — M25511 Pain in right shoulder: Secondary | ICD-10-CM | POA: Diagnosis not present

## 2018-05-02 ENCOUNTER — Other Ambulatory Visit: Payer: Self-pay | Admitting: Internal Medicine

## 2018-05-08 DIAGNOSIS — M25511 Pain in right shoulder: Secondary | ICD-10-CM | POA: Diagnosis not present

## 2018-05-13 ENCOUNTER — Other Ambulatory Visit: Payer: Self-pay | Admitting: Internal Medicine

## 2018-05-13 ENCOUNTER — Telehealth: Payer: Self-pay | Admitting: *Deleted

## 2018-05-13 DIAGNOSIS — M25512 Pain in left shoulder: Secondary | ICD-10-CM | POA: Diagnosis not present

## 2018-05-13 DIAGNOSIS — M25511 Pain in right shoulder: Secondary | ICD-10-CM | POA: Diagnosis not present

## 2018-05-13 DIAGNOSIS — Z4789 Encounter for other orthopedic aftercare: Secondary | ICD-10-CM | POA: Diagnosis not present

## 2018-05-13 NOTE — Telephone Encounter (Signed)
Called pharmacy, they will fill med on 9/20, they have script and will notify pt

## 2018-05-13 NOTE — Telephone Encounter (Signed)
Refill Request    morphine (MSIR) 30 MG tablet    morphine (MSIR) 30 MG tablet    [id number] - [place], [place] - [address]

## 2018-05-13 NOTE — Telephone Encounter (Signed)
Pt called for refill of pain med Review medlist, called pharm, informed yes they have a script for #120 that can be filled on 9/20, the pharmacist called pt informed her Pt then called triage c/o that her schedule would be "messed up" if she got the med on 9/20 because her schedule is 9/25, she states she has had to take more than she is directed to take because of her shoulder pain from surgery. She states that neither the surgeon nor dr Heber [place] instructed her to take more than directed. She would like a script to hold her over til the 25th. She was informed that her insurance will only pay for 1 script per 30 days so she would be responsible for the 2nd script cost, she states that dr Heber [place] could write the script for something else to hold her over.  Pt stated "all of you distort the truth, I dont want you putting this in my chart" informed pt that a note must be made of her call Dr Heber Hassell would you like to prescribe something else? Sending to dr's hoffman and Software engineer

## 2018-05-14 ENCOUNTER — Telehealth: Payer: Self-pay | Admitting: Internal Medicine

## 2018-05-15 NOTE — Telephone Encounter (Signed)
Per narcotic database she has received a prescription of oxycodone on 03/30/18 and 05/13/18 (50 tablets) from her orthopedic doctor.  I prescribe morphine for her chronic neck pain.  I will not be prescribing her anything else at this time.

## 2018-06-11 DIAGNOSIS — M25511 Pain in right shoulder: Secondary | ICD-10-CM | POA: Diagnosis not present
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
## 2018-06-25 ENCOUNTER — Ambulatory Visit (INDEPENDENT_AMBULATORY_CARE_PROVIDER_SITE_OTHER): Payer: PPO | Admitting: Internal Medicine

## 2018-06-25 ENCOUNTER — Encounter: Payer: Self-pay | Admitting: Internal Medicine

## 2018-06-25 ENCOUNTER — Encounter (INDEPENDENT_AMBULATORY_CARE_PROVIDER_SITE_OTHER): Payer: Self-pay

## 2018-06-25 VITALS — BP 137/94 | HR 68 | Wt 150.2 lb

## 2018-06-25 DIAGNOSIS — Z720 Tobacco use: Secondary | ICD-10-CM

## 2018-06-25 DIAGNOSIS — M47812 Spondylosis without myelopathy or radiculopathy, cervical region: Secondary | ICD-10-CM | POA: Diagnosis not present

## 2018-06-25 DIAGNOSIS — R110 Nausea: Secondary | ICD-10-CM

## 2018-06-25 DIAGNOSIS — Z79891 Long term (current) use of opiate analgesic: Secondary | ICD-10-CM | POA: Diagnosis not present

## 2018-06-25 DIAGNOSIS — R079 Chest pain, unspecified: Secondary | ICD-10-CM

## 2018-06-25 DIAGNOSIS — F17200 Nicotine dependence, unspecified, uncomplicated: Secondary | ICD-10-CM

## 2018-06-25 DIAGNOSIS — T43225A Adverse effect of selective serotonin reuptake inhibitors, initial encounter: Secondary | ICD-10-CM | POA: Diagnosis not present

## 2018-06-25 DIAGNOSIS — F339 Major depressive disorder, recurrent, unspecified: Secondary | ICD-10-CM

## 2018-06-25 DIAGNOSIS — Z79899 Other long term (current) drug therapy: Secondary | ICD-10-CM | POA: Diagnosis not present

## 2018-06-25 DIAGNOSIS — F330 Major depressive disorder, recurrent, mild: Secondary | ICD-10-CM

## 2018-06-25 DIAGNOSIS — R0789 Other chest pain: Secondary | ICD-10-CM

## 2018-06-25 DIAGNOSIS — G8929 Other chronic pain: Secondary | ICD-10-CM | POA: Diagnosis not present

## 2018-06-25 MED ORDER — ASPIRIN 325 MG PO TABS
325.0000 mg | ORAL_TABLET | Freq: Once | ORAL | Status: AC
  Administered 2018-06-25: 325 mg via ORAL

## 2018-06-25 MED ORDER — ASPIRIN 325 MG PO TABS: 325.0000 mg | ORAL_TABLET | Freq: Every day | ORAL | Status: DC

## 2018-06-25 NOTE — Patient Instructions (Addendum)
Ms. Cornia,  It was a pleasure seeing you today.  Please follow up in 2 months.

## 2018-06-26 ENCOUNTER — Other Ambulatory Visit: Payer: Self-pay | Admitting: Internal Medicine

## 2018-06-26 MED ORDER — MORPHINE SULFATE 30 MG PO TABS: 30.0000 mg | ORAL_TABLET | ORAL | 0 refills | Status: DC | PRN

## 2018-06-26 MED ORDER — VARENICLINE TARTRATE 1 MG PO TABS: 1.0000 mg | ORAL_TABLET | Freq: Two times a day (BID) | ORAL | 0 refills | Status: DC

## 2018-06-26 MED ORDER — VARENICLINE TARTRATE 0.5 MG PO TABS: 0.5000 mg | ORAL_TABLET | Freq: Every day | ORAL | 1 refills | Status: DC

## 2018-06-26 MED ORDER — VENLAFAXINE HCL ER 37.5 MG PO CP24: 37.5000 mg | ORAL_CAPSULE | Freq: Every day | ORAL | 0 refills | Status: DC

## 2018-06-26 MED ORDER — MORPHINE SULFATE 30 MG PO TABS: ORAL_TABLET | ORAL | 0 refills | Status: DC

## 2018-06-30 NOTE — Assessment & Plan Note (Signed)
HPI: Patient is on morphine 30 mg #120 per month. She states she is able to accomplish her IDLs with her current medication regimen.  Assessment: Chronic pain from Cervical osteoarthritis on long term use of opioid medication Pain is controlled with current medication regimen.  Narcotic database checked and appropriate.  Plan - refill morphine 30mg  # 120 per month, good through ~approx Jan 24,2020

## 2018-06-30 NOTE — Assessment & Plan Note (Signed)
HPI:  Patient states that she has quit smoking for the past 5 years but is now having cravings to smoke and is smoking about 5 cigarettes a day.  She states that she has tried chantix in the past and this worked well for her.  She states she stopped using it because it worsened her depression.  Despite this she would like to re-start it.  Assessment:  Smoking cessation Patient would like to restart chantix.  Will need to monitor depression and consider adding wellbutrin if depression symptoms are not controlled with SNRI.  Plan -Chantix

## 2018-06-30 NOTE — Assessment & Plan Note (Signed)
HPI:  Patient presented to the clinic for chronic opioid follow up when she started experiencing sudden sharp substernal chest pain when nursing was obtaining vitals.  The pain resolved soon after the administration of aspirin.  Patient declined EKG or further cardiac workup  Assessment:  Atypical chest pain Unclear etiology.  Resolved with aspirin.  Patient had a stress test 01/2018 that was low risk.  No further work up needed  Plan -monitor

## 2018-06-30 NOTE — Assessment & Plan Note (Signed)
HPI:  Patient states that since her increase in celexa from 20mg  to 40mg  last clinic visit she has been experiencing nausea, vomiting and diarrhea on a weekly basis.    Assessment:  Major Depressive Disorder PHQ-9 is 8 today.  Unfortunately due to medication side effects the discontinuation of celexa was discussed.  Patient was agreeable to trying venlafaxine  Plan -stop celexa -start venlafaxine

## 2018-07-03 NOTE — Progress Notes (Signed)
Patient ID: Julie Williams, female   DOB: 12/24/1965, 52 y.o.   MRN: [id number]  I saw and evaluated the patient.  I personally confirmed the key portions of Dr. Jodene Nam history and exam and reviewed pertinent patient test results.  The assessment, diagnosis, and plan were formulated together and I agree with the documentation in the resident's note.  The develop of the nausea coincides with the change in the Celexa dose and is a known adverse reaction.  We will therefore change to an alternate SSRI and reassess for nausea at follow-up.

## 2018-07-09 ENCOUNTER — Telehealth: Payer: Self-pay | Admitting: Internal Medicine

## 2018-07-26 DIAGNOSIS — Z8719 Personal history of other diseases of the digestive system: Secondary | ICD-10-CM

## 2018-07-26 DIAGNOSIS — K631 Perforation of intestine (nontraumatic): Secondary | ICD-10-CM

## 2018-07-26 DIAGNOSIS — Z8619 Personal history of other infectious and parasitic diseases: Secondary | ICD-10-CM

## 2018-07-26 DIAGNOSIS — Z8709 Personal history of other diseases of the respiratory system: Secondary | ICD-10-CM

## 2018-07-26 HISTORY — DX: Personal history of other diseases of the digestive system: Z87.19

## 2018-07-26 HISTORY — DX: Personal history of other diseases of the respiratory system: Z87.09

## 2018-07-26 HISTORY — DX: Perforation of intestine (nontraumatic): K63.1

## 2018-07-26 HISTORY — DX: Personal history of other infectious and parasitic diseases: Z86.19

## 2018-07-27 ENCOUNTER — Other Ambulatory Visit: Payer: Self-pay | Admitting: Internal Medicine

## 2018-08-02 DIAGNOSIS — K658 Other peritonitis: Secondary | ICD-10-CM

## 2018-08-02 HISTORY — DX: Other peritonitis: K65.8

## 2018-08-14 ENCOUNTER — Encounter: Payer: Self-pay | Admitting: Internal Medicine

## 2018-08-14 ENCOUNTER — Ambulatory Visit (INDEPENDENT_AMBULATORY_CARE_PROVIDER_SITE_OTHER): Payer: PPO | Admitting: Internal Medicine

## 2018-08-14 ENCOUNTER — Encounter (INDEPENDENT_AMBULATORY_CARE_PROVIDER_SITE_OTHER): Payer: Self-pay

## 2018-08-14 DIAGNOSIS — M542 Cervicalgia: Secondary | ICD-10-CM

## 2018-08-14 DIAGNOSIS — Z79891 Long term (current) use of opiate analgesic: Secondary | ICD-10-CM | POA: Diagnosis not present

## 2018-08-14 DIAGNOSIS — G8929 Other chronic pain: Secondary | ICD-10-CM

## 2018-08-14 DIAGNOSIS — R030 Elevated blood-pressure reading, without diagnosis of hypertension: Secondary | ICD-10-CM

## 2018-08-14 MED ORDER — VENLAFAXINE HCL ER 37.5 MG PO CP24: 37.5000 mg | ORAL_CAPSULE | Freq: Every day | ORAL | 0 refills | Status: DC

## 2018-08-14 MED ORDER — VENLAFAXINE HCL ER 37.5 MG PO CP24: 37.5000 mg | ORAL_CAPSULE | Freq: Every day | ORAL | 1 refills | Status: DC

## 2018-08-14 MED ORDER — OMEPRAZOLE 40 MG PO CPDR: 40.0000 mg | DELAYED_RELEASE_CAPSULE | Freq: Every day | ORAL | 3 refills | Status: DC

## 2018-08-14 MED ORDER — MORPHINE SULFATE 30 MG PO TABS: 30.0000 mg | ORAL_TABLET | ORAL | 0 refills | Status: DC | PRN

## 2018-08-14 NOTE — Patient Instructions (Signed)
Julie Williams,  It was a pleasure seeing you today. Please follow up in March and bring log of your blood pressures.
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
## 2018-08-16 DIAGNOSIS — R030 Elevated blood-pressure reading, without diagnosis of hypertension: Secondary | ICD-10-CM | POA: Insufficient documentation

## 2018-08-16 NOTE — Assessment & Plan Note (Addendum)
HPI:  Patient reports ability to due her activities of daily living when using morphine  Assessment:  Long term use of chronic opioids due to chronic neck pain Patient currently has prescriptions good through 1/24.  Will give 2 month supply to start after 1/24 for 2 months (good through 3/24).  She will see me again in march for re-evaluation and refills at that time.  Plan -prescriptions of MSIR 30mg  #120, good through 3/24

## 2018-08-16 NOTE — Assessment & Plan Note (Signed)
Assessment:  Elevated blood pressure  Patient is not currently on anti-hypertensives.  Her blood pressure is elevated today 151/94 and similar on repeat.  Readings in the past have been controlled.  At this time asked patient to keep a log of blood pressure readings to go over at next visit.  Plan -monitor with blood pressure log readings

## 2018-08-16 NOTE — Assessment & Plan Note (Deleted)
Assessment:  Elevated blood pressure  Patient is not currently on anti-hypertensives.  Her blood pressure is elevated today 151/94 and similar on repeat.  Readings in the past have been controlled.  At this time asked patient to keep a log of blood pressure readings to go over at next visit.  Plan -monitor with blood pressure log readings

## 2018-08-17 NOTE — Progress Notes (Signed)
Medicine attending: Medical history, presenting problems, physical findings, and medications, reviewed with resident physician Dr Jessica Hoffman on the day of the patient visit and I concur with her evaluation and management plan. 

## 2018-08-19 DIAGNOSIS — K631 Perforation of intestine (nontraumatic): Secondary | ICD-10-CM | POA: Diagnosis not present

## 2018-08-19 DIAGNOSIS — Z9289 Personal history of other medical treatment: Secondary | ICD-10-CM | POA: Diagnosis not present

## 2018-08-19 DIAGNOSIS — D649 Anemia, unspecified: Secondary | ICD-10-CM | POA: Diagnosis not present

## 2018-08-19 DIAGNOSIS — K6389 Other specified diseases of intestine: Secondary | ICD-10-CM | POA: Diagnosis not present

## 2018-08-19 DIAGNOSIS — R112 Nausea with vomiting, unspecified: Secondary | ICD-10-CM | POA: Diagnosis not present

## 2018-08-19 DIAGNOSIS — N179 Acute kidney failure, unspecified: Secondary | ICD-10-CM | POA: Diagnosis not present

## 2018-08-19 DIAGNOSIS — J690 Pneumonitis due to inhalation of food and vomit: Secondary | ICD-10-CM | POA: Diagnosis not present

## 2018-08-19 DIAGNOSIS — I313 Pericardial effusion (noninflammatory): Secondary | ICD-10-CM | POA: Diagnosis not present

## 2018-08-19 DIAGNOSIS — K5900 Constipation, unspecified: Secondary | ICD-10-CM | POA: Diagnosis not present

## 2018-08-19 DIAGNOSIS — E876 Hypokalemia: Secondary | ICD-10-CM | POA: Diagnosis not present

## 2018-08-19 DIAGNOSIS — R52 Pain, unspecified: Secondary | ICD-10-CM | POA: Diagnosis not present

## 2018-08-19 DIAGNOSIS — Z79899 Other long term (current) drug therapy: Secondary | ICD-10-CM | POA: Diagnosis not present

## 2018-08-19 DIAGNOSIS — Z4682 Encounter for fitting and adjustment of non-vascular catheter: Secondary | ICD-10-CM | POA: Diagnosis not present

## 2018-08-19 DIAGNOSIS — K219 Gastro-esophageal reflux disease without esophagitis: Secondary | ICD-10-CM | POA: Diagnosis not present

## 2018-08-19 DIAGNOSIS — Z451 Encounter for adjustment and management of infusion pump: Secondary | ICD-10-CM | POA: Diagnosis not present

## 2018-08-19 DIAGNOSIS — J9811 Atelectasis: Secondary | ICD-10-CM | POA: Diagnosis not present

## 2018-08-19 DIAGNOSIS — J9690 Respiratory failure, unspecified, unspecified whether with hypoxia or hypercapnia: Secondary | ICD-10-CM | POA: Diagnosis not present

## 2018-08-19 DIAGNOSIS — K659 Peritonitis, unspecified: Secondary | ICD-10-CM | POA: Diagnosis not present

## 2018-08-19 DIAGNOSIS — R1032 Left lower quadrant pain: Secondary | ICD-10-CM | POA: Diagnosis not present

## 2018-08-19 DIAGNOSIS — D696 Thrombocytopenia, unspecified: Secondary | ICD-10-CM | POA: Diagnosis not present

## 2018-08-19 DIAGNOSIS — A4181 Sepsis due to Enterococcus: Secondary | ICD-10-CM | POA: Diagnosis not present

## 2018-08-19 DIAGNOSIS — A419 Sepsis, unspecified organism: Secondary | ICD-10-CM | POA: Diagnosis not present

## 2018-08-19 DIAGNOSIS — R0902 Hypoxemia: Secondary | ICD-10-CM | POA: Diagnosis not present

## 2018-08-19 DIAGNOSIS — F329 Major depressive disorder, single episode, unspecified: Secondary | ICD-10-CM | POA: Diagnosis not present

## 2018-08-19 DIAGNOSIS — K56609 Unspecified intestinal obstruction, unspecified as to partial versus complete obstruction: Secondary | ICD-10-CM | POA: Diagnosis not present

## 2018-08-19 DIAGNOSIS — R000 Tachycardia, unspecified: Secondary | ICD-10-CM | POA: Diagnosis not present

## 2018-08-19 DIAGNOSIS — I4891 Unspecified atrial fibrillation: Secondary | ICD-10-CM | POA: Diagnosis not present

## 2018-08-19 DIAGNOSIS — M5412 Radiculopathy, cervical region: Secondary | ICD-10-CM | POA: Diagnosis not present

## 2018-08-19 DIAGNOSIS — J9601 Acute respiratory failure with hypoxia: Secondary | ICD-10-CM | POA: Diagnosis not present

## 2018-08-19 DIAGNOSIS — G5602 Carpal tunnel syndrome, left upper limb: Secondary | ICD-10-CM | POA: Diagnosis not present

## 2018-08-19 DIAGNOSIS — M1990 Unspecified osteoarthritis, unspecified site: Secondary | ICD-10-CM | POA: Diagnosis not present

## 2018-08-19 DIAGNOSIS — R110 Nausea: Secondary | ICD-10-CM | POA: Diagnosis not present

## 2018-08-19 DIAGNOSIS — J90 Pleural effusion, not elsewhere classified: Secondary | ICD-10-CM | POA: Diagnosis not present

## 2018-08-19 DIAGNOSIS — I959 Hypotension, unspecified: Secondary | ICD-10-CM | POA: Diagnosis not present

## 2018-08-19 DIAGNOSIS — R1084 Generalized abdominal pain: Secondary | ICD-10-CM | POA: Diagnosis not present

## 2018-08-19 DIAGNOSIS — J80 Acute respiratory distress syndrome: Secondary | ICD-10-CM | POA: Diagnosis not present

## 2018-08-19 DIAGNOSIS — Z87891 Personal history of nicotine dependence: Secondary | ICD-10-CM | POA: Diagnosis not present

## 2018-08-19 DIAGNOSIS — E872 Acidosis: Secondary | ICD-10-CM | POA: Diagnosis not present

## 2018-08-19 DIAGNOSIS — R0689 Other abnormalities of breathing: Secondary | ICD-10-CM | POA: Diagnosis not present

## 2018-08-19 DIAGNOSIS — G5601 Carpal tunnel syndrome, right upper limb: Secondary | ICD-10-CM | POA: Diagnosis not present

## 2018-08-19 DIAGNOSIS — F1120 Opioid dependence, uncomplicated: Secondary | ICD-10-CM | POA: Diagnosis not present

## 2018-08-19 DIAGNOSIS — Z01818 Encounter for other preprocedural examination: Secondary | ICD-10-CM | POA: Diagnosis not present

## 2018-08-19 DIAGNOSIS — G8929 Other chronic pain: Secondary | ICD-10-CM | POA: Diagnosis not present

## 2018-08-19 DIAGNOSIS — R6521 Severe sepsis with septic shock: Secondary | ICD-10-CM | POA: Diagnosis not present

## 2018-08-19 DIAGNOSIS — Z978 Presence of other specified devices: Secondary | ICD-10-CM | POA: Diagnosis not present

## 2018-08-19 DIAGNOSIS — K567 Ileus, unspecified: Secondary | ICD-10-CM | POA: Diagnosis not present

## 2018-08-19 DIAGNOSIS — E875 Hyperkalemia: Secondary | ICD-10-CM | POA: Diagnosis not present

## 2018-08-19 DIAGNOSIS — A499 Bacterial infection, unspecified: Secondary | ICD-10-CM | POA: Diagnosis not present

## 2018-08-19 DIAGNOSIS — J9809 Other diseases of bronchus, not elsewhere classified: Secondary | ICD-10-CM | POA: Diagnosis not present

## 2018-08-19 DIAGNOSIS — G894 Chronic pain syndrome: Secondary | ICD-10-CM | POA: Diagnosis not present

## 2018-08-19 DIAGNOSIS — M542 Cervicalgia: Secondary | ICD-10-CM | POA: Diagnosis not present

## 2018-08-19 DIAGNOSIS — G9340 Encephalopathy, unspecified: Secondary | ICD-10-CM | POA: Diagnosis not present

## 2018-08-19 DIAGNOSIS — J984 Other disorders of lung: Secondary | ICD-10-CM | POA: Diagnosis not present

## 2018-08-19 DIAGNOSIS — I10 Essential (primary) hypertension: Secondary | ICD-10-CM | POA: Diagnosis not present

## 2018-08-19 HISTORY — PX: COLOSTOMY: SHX63

## 2018-08-21 DIAGNOSIS — I313 Pericardial effusion (noninflammatory): Secondary | ICD-10-CM

## 2018-08-21 DIAGNOSIS — Z01818 Encounter for other preprocedural examination: Secondary | ICD-10-CM

## 2018-08-23 ENCOUNTER — Inpatient Hospital Stay (HOSPITAL_COMMUNITY): Payer: PPO

## 2018-08-23 ENCOUNTER — Telehealth: Payer: Self-pay | Admitting: Internal Medicine

## 2018-08-23 ENCOUNTER — Inpatient Hospital Stay (HOSPITAL_COMMUNITY)
Admission: AD | Admit: 2018-08-23 | Discharge: 2018-09-13 | DRG: 870 | Disposition: A | Discharge status: Skilled Nursing Facility | Payer: PPO | Source: Other Acute Inpatient Hospital | Attending: Oncology | Admitting: Oncology

## 2018-08-23 DIAGNOSIS — F329 Major depressive disorder, single episode, unspecified: Secondary | ICD-10-CM | POA: Diagnosis not present

## 2018-08-23 DIAGNOSIS — T402X5S Adverse effect of other opioids, sequela: Secondary | ICD-10-CM | POA: Diagnosis not present

## 2018-08-23 DIAGNOSIS — Z87891 Personal history of nicotine dependence: Secondary | ICD-10-CM

## 2018-08-23 DIAGNOSIS — Z9289 Personal history of other medical treatment: Secondary | ICD-10-CM

## 2018-08-23 DIAGNOSIS — J158 Pneumonia due to other specified bacteria: Secondary | ICD-10-CM | POA: Diagnosis not present

## 2018-08-23 DIAGNOSIS — I10 Essential (primary) hypertension: Secondary | ICD-10-CM | POA: Diagnosis present

## 2018-08-23 DIAGNOSIS — F1010 Alcohol abuse, uncomplicated: Secondary | ICD-10-CM | POA: Diagnosis present

## 2018-08-23 DIAGNOSIS — Z452 Encounter for adjustment and management of vascular access device: Secondary | ICD-10-CM

## 2018-08-23 DIAGNOSIS — R188 Other ascites: Secondary | ICD-10-CM | POA: Diagnosis not present

## 2018-08-23 DIAGNOSIS — E870 Hyperosmolality and hypernatremia: Secondary | ICD-10-CM | POA: Diagnosis not present

## 2018-08-23 DIAGNOSIS — R1310 Dysphagia, unspecified: Secondary | ICD-10-CM | POA: Diagnosis not present

## 2018-08-23 DIAGNOSIS — R279 Unspecified lack of coordination: Secondary | ICD-10-CM | POA: Diagnosis not present

## 2018-08-23 DIAGNOSIS — Z91030 Bee allergy status: Secondary | ICD-10-CM | POA: Diagnosis not present

## 2018-08-23 DIAGNOSIS — I4891 Unspecified atrial fibrillation: Secondary | ICD-10-CM | POA: Diagnosis not present

## 2018-08-23 DIAGNOSIS — D62 Acute posthemorrhagic anemia: Secondary | ICD-10-CM | POA: Diagnosis not present

## 2018-08-23 DIAGNOSIS — K760 Fatty (change of) liver, not elsewhere classified: Secondary | ICD-10-CM | POA: Diagnosis present

## 2018-08-23 DIAGNOSIS — R109 Unspecified abdominal pain: Secondary | ICD-10-CM | POA: Diagnosis not present

## 2018-08-23 DIAGNOSIS — T8149XA Infection following a procedure, other surgical site, initial encounter: Secondary | ICD-10-CM | POA: Diagnosis not present

## 2018-08-23 DIAGNOSIS — N179 Acute kidney failure, unspecified: Secondary | ICD-10-CM | POA: Diagnosis not present

## 2018-08-23 DIAGNOSIS — K750 Abscess of liver: Secondary | ICD-10-CM | POA: Diagnosis not present

## 2018-08-23 DIAGNOSIS — R0603 Acute respiratory distress: Secondary | ICD-10-CM | POA: Diagnosis not present

## 2018-08-23 DIAGNOSIS — G9340 Encephalopathy, unspecified: Secondary | ICD-10-CM | POA: Diagnosis not present

## 2018-08-23 DIAGNOSIS — D649 Anemia, unspecified: Secondary | ICD-10-CM | POA: Diagnosis not present

## 2018-08-23 DIAGNOSIS — Z930 Tracheostomy status: Secondary | ICD-10-CM | POA: Diagnosis not present

## 2018-08-23 DIAGNOSIS — R509 Fever, unspecified: Secondary | ICD-10-CM

## 2018-08-23 DIAGNOSIS — K658 Other peritonitis: Secondary | ICD-10-CM | POA: Diagnosis not present

## 2018-08-23 DIAGNOSIS — A4181 Sepsis due to Enterococcus: Principal | ICD-10-CM | POA: Diagnosis present

## 2018-08-23 DIAGNOSIS — N170 Acute kidney failure with tubular necrosis: Secondary | ICD-10-CM | POA: Diagnosis not present

## 2018-08-23 DIAGNOSIS — T8131XA Disruption of external operation (surgical) wound, not elsewhere classified, initial encounter: Secondary | ICD-10-CM | POA: Diagnosis not present

## 2018-08-23 DIAGNOSIS — T402X5A Adverse effect of other opioids, initial encounter: Secondary | ICD-10-CM | POA: Diagnosis not present

## 2018-08-23 DIAGNOSIS — Z800 Family history of malignant neoplasm of digestive organs: Secondary | ICD-10-CM

## 2018-08-23 DIAGNOSIS — A499 Bacterial infection, unspecified: Secondary | ICD-10-CM | POA: Diagnosis not present

## 2018-08-23 DIAGNOSIS — A394 Meningococcemia, unspecified: Secondary | ICD-10-CM | POA: Diagnosis present

## 2018-08-23 DIAGNOSIS — R451 Restlessness and agitation: Secondary | ICD-10-CM | POA: Diagnosis not present

## 2018-08-23 DIAGNOSIS — J9690 Respiratory failure, unspecified, unspecified whether with hypoxia or hypercapnia: Secondary | ICD-10-CM

## 2018-08-23 DIAGNOSIS — Z95828 Presence of other vascular implants and grafts: Secondary | ICD-10-CM | POA: Diagnosis not present

## 2018-08-23 DIAGNOSIS — E43 Unspecified severe protein-calorie malnutrition: Secondary | ICD-10-CM | POA: Diagnosis not present

## 2018-08-23 DIAGNOSIS — Z8701 Personal history of pneumonia (recurrent): Secondary | ICD-10-CM | POA: Diagnosis not present

## 2018-08-23 DIAGNOSIS — I87303 Chronic venous hypertension (idiopathic) without complications of bilateral lower extremity: Secondary | ICD-10-CM | POA: Diagnosis not present

## 2018-08-23 DIAGNOSIS — G8929 Other chronic pain: Secondary | ICD-10-CM | POA: Diagnosis not present

## 2018-08-23 DIAGNOSIS — K5903 Drug induced constipation: Secondary | ICD-10-CM | POA: Diagnosis not present

## 2018-08-23 DIAGNOSIS — Z6830 Body mass index (BMI) 30.0-30.9, adult: Secondary | ICD-10-CM | POA: Diagnosis not present

## 2018-08-23 DIAGNOSIS — Z9049 Acquired absence of other specified parts of digestive tract: Secondary | ICD-10-CM

## 2018-08-23 DIAGNOSIS — Z433 Encounter for attention to colostomy: Secondary | ICD-10-CM | POA: Diagnosis not present

## 2018-08-23 DIAGNOSIS — R140 Abdominal distension (gaseous): Secondary | ICD-10-CM | POA: Diagnosis not present

## 2018-08-23 DIAGNOSIS — R7881 Bacteremia: Secondary | ICD-10-CM

## 2018-08-23 DIAGNOSIS — Z7189 Other specified counseling: Secondary | ICD-10-CM | POA: Diagnosis not present

## 2018-08-23 DIAGNOSIS — E872 Acidosis: Secondary | ICD-10-CM | POA: Diagnosis not present

## 2018-08-23 DIAGNOSIS — G9341 Metabolic encephalopathy: Secondary | ICD-10-CM | POA: Diagnosis not present

## 2018-08-23 DIAGNOSIS — B9620 Unspecified Escherichia coli [E. coli] as the cause of diseases classified elsewhere: Secondary | ICD-10-CM | POA: Diagnosis not present

## 2018-08-23 DIAGNOSIS — G894 Chronic pain syndrome: Secondary | ICD-10-CM | POA: Diagnosis not present

## 2018-08-23 DIAGNOSIS — R6521 Severe sepsis with septic shock: Secondary | ICD-10-CM | POA: Diagnosis present

## 2018-08-23 DIAGNOSIS — B952 Enterococcus as the cause of diseases classified elsewhere: Secondary | ICD-10-CM | POA: Diagnosis not present

## 2018-08-23 DIAGNOSIS — J80 Acute respiratory distress syndrome: Secondary | ICD-10-CM | POA: Diagnosis not present

## 2018-08-23 DIAGNOSIS — R410 Disorientation, unspecified: Secondary | ICD-10-CM | POA: Diagnosis not present

## 2018-08-23 DIAGNOSIS — J849 Interstitial pulmonary disease, unspecified: Secondary | ICD-10-CM | POA: Diagnosis not present

## 2018-08-23 DIAGNOSIS — K626 Ulcer of anus and rectum: Secondary | ICD-10-CM | POA: Diagnosis present

## 2018-08-23 DIAGNOSIS — K56609 Unspecified intestinal obstruction, unspecified as to partial versus complete obstruction: Secondary | ICD-10-CM | POA: Diagnosis present

## 2018-08-23 DIAGNOSIS — M47812 Spondylosis without myelopathy or radiculopathy, cervical region: Secondary | ICD-10-CM | POA: Diagnosis present

## 2018-08-23 DIAGNOSIS — K659 Peritonitis, unspecified: Secondary | ICD-10-CM | POA: Diagnosis not present

## 2018-08-23 DIAGNOSIS — J690 Pneumonitis due to inhalation of food and vomit: Secondary | ICD-10-CM | POA: Diagnosis not present

## 2018-08-23 DIAGNOSIS — H9190 Unspecified hearing loss, unspecified ear: Secondary | ICD-10-CM | POA: Diagnosis not present

## 2018-08-23 DIAGNOSIS — Z981 Arthrodesis status: Secondary | ICD-10-CM

## 2018-08-23 DIAGNOSIS — F419 Anxiety disorder, unspecified: Secondary | ICD-10-CM | POA: Diagnosis not present

## 2018-08-23 DIAGNOSIS — K651 Peritoneal abscess: Secondary | ICD-10-CM | POA: Diagnosis not present

## 2018-08-23 DIAGNOSIS — R000 Tachycardia, unspecified: Secondary | ICD-10-CM

## 2018-08-23 DIAGNOSIS — Z5329 Procedure and treatment not carried out because of patient's decision for other reasons: Secondary | ICD-10-CM | POA: Diagnosis not present

## 2018-08-23 DIAGNOSIS — J9601 Acute respiratory failure with hypoxia: Secondary | ICD-10-CM | POA: Diagnosis present

## 2018-08-23 DIAGNOSIS — Z4682 Encounter for fitting and adjustment of non-vascular catheter: Secondary | ICD-10-CM | POA: Diagnosis not present

## 2018-08-23 DIAGNOSIS — R404 Transient alteration of awareness: Secondary | ICD-10-CM | POA: Diagnosis not present

## 2018-08-23 DIAGNOSIS — K219 Gastro-esophageal reflux disease without esophagitis: Secondary | ICD-10-CM | POA: Diagnosis present

## 2018-08-23 DIAGNOSIS — K7689 Other specified diseases of liver: Secondary | ICD-10-CM | POA: Diagnosis not present

## 2018-08-23 DIAGNOSIS — B961 Klebsiella pneumoniae [K. pneumoniae] as the cause of diseases classified elsewhere: Secondary | ICD-10-CM | POA: Diagnosis not present

## 2018-08-23 DIAGNOSIS — K631 Perforation of intestine (nontraumatic): Secondary | ICD-10-CM | POA: Diagnosis present

## 2018-08-23 DIAGNOSIS — R160 Hepatomegaly, not elsewhere classified: Secondary | ICD-10-CM | POA: Diagnosis not present

## 2018-08-23 DIAGNOSIS — Z9989 Dependence on other enabling machines and devices: Secondary | ICD-10-CM | POA: Diagnosis not present

## 2018-08-23 DIAGNOSIS — Z5320 Procedure and treatment not carried out because of patient's decision for unspecified reasons: Secondary | ICD-10-CM | POA: Diagnosis not present

## 2018-08-23 DIAGNOSIS — J189 Pneumonia, unspecified organism: Secondary | ICD-10-CM | POA: Diagnosis not present

## 2018-08-23 DIAGNOSIS — Z933 Colostomy status: Secondary | ICD-10-CM | POA: Diagnosis not present

## 2018-08-23 DIAGNOSIS — Z9911 Dependence on respirator [ventilator] status: Secondary | ICD-10-CM | POA: Diagnosis not present

## 2018-08-23 DIAGNOSIS — K567 Ileus, unspecified: Secondary | ICD-10-CM | POA: Diagnosis not present

## 2018-08-23 DIAGNOSIS — J9809 Other diseases of bronchus, not elsewhere classified: Secondary | ICD-10-CM | POA: Diagnosis not present

## 2018-08-23 DIAGNOSIS — K9409 Other complications of colostomy: Secondary | ICD-10-CM | POA: Diagnosis not present

## 2018-08-23 DIAGNOSIS — Z79891 Long term (current) use of opiate analgesic: Secondary | ICD-10-CM

## 2018-08-23 DIAGNOSIS — Z79899 Other long term (current) drug therapy: Secondary | ICD-10-CM

## 2018-08-23 DIAGNOSIS — R112 Nausea with vomiting, unspecified: Secondary | ICD-10-CM | POA: Diagnosis not present

## 2018-08-23 DIAGNOSIS — R0902 Hypoxemia: Secondary | ICD-10-CM | POA: Diagnosis not present

## 2018-08-23 DIAGNOSIS — I1311 Hypertensive heart and chronic kidney disease without heart failure, with stage 5 chronic kidney disease, or end stage renal disease: Secondary | ICD-10-CM | POA: Diagnosis not present

## 2018-08-23 DIAGNOSIS — Z7982 Long term (current) use of aspirin: Secondary | ICD-10-CM

## 2018-08-23 DIAGNOSIS — D6489 Other specified anemias: Secondary | ICD-10-CM | POA: Diagnosis present

## 2018-08-23 DIAGNOSIS — D500 Iron deficiency anemia secondary to blood loss (chronic): Secondary | ICD-10-CM | POA: Diagnosis not present

## 2018-08-23 DIAGNOSIS — Z515 Encounter for palliative care: Secondary | ICD-10-CM | POA: Diagnosis not present

## 2018-08-23 DIAGNOSIS — Z8249 Family history of ischemic heart disease and other diseases of the circulatory system: Secondary | ICD-10-CM

## 2018-08-23 DIAGNOSIS — Z978 Presence of other specified devices: Secondary | ICD-10-CM | POA: Diagnosis not present

## 2018-08-23 DIAGNOSIS — R1110 Vomiting, unspecified: Secondary | ICD-10-CM | POA: Diagnosis not present

## 2018-08-23 DIAGNOSIS — J90 Pleural effusion, not elsewhere classified: Secondary | ICD-10-CM | POA: Diagnosis present

## 2018-08-23 DIAGNOSIS — K56600 Partial intestinal obstruction, unspecified as to cause: Secondary | ICD-10-CM | POA: Diagnosis not present

## 2018-08-23 DIAGNOSIS — A419 Sepsis, unspecified organism: Secondary | ICD-10-CM | POA: Diagnosis not present

## 2018-08-23 DIAGNOSIS — J9811 Atelectasis: Secondary | ICD-10-CM | POA: Diagnosis not present

## 2018-08-23 DIAGNOSIS — D72829 Elevated white blood cell count, unspecified: Secondary | ICD-10-CM | POA: Diagnosis not present

## 2018-08-23 DIAGNOSIS — J180 Bronchopneumonia, unspecified organism: Secondary | ICD-10-CM | POA: Diagnosis not present

## 2018-08-23 DIAGNOSIS — J9600 Acute respiratory failure, unspecified whether with hypoxia or hypercapnia: Secondary | ICD-10-CM | POA: Diagnosis not present

## 2018-08-23 DIAGNOSIS — Z8719 Personal history of other diseases of the digestive system: Secondary | ICD-10-CM

## 2018-08-23 DIAGNOSIS — R918 Other nonspecific abnormal finding of lung field: Secondary | ICD-10-CM | POA: Diagnosis not present

## 2018-08-23 DIAGNOSIS — B9689 Other specified bacterial agents as the cause of diseases classified elsewhere: Secondary | ICD-10-CM | POA: Diagnosis not present

## 2018-08-23 DIAGNOSIS — Z743 Need for continuous supervision: Secondary | ICD-10-CM | POA: Diagnosis not present

## 2018-08-23 DIAGNOSIS — R4182 Altered mental status, unspecified: Secondary | ICD-10-CM | POA: Diagnosis not present

## 2018-08-23 DIAGNOSIS — R5381 Other malaise: Secondary | ICD-10-CM | POA: Diagnosis present

## 2018-08-23 DIAGNOSIS — E876 Hypokalemia: Secondary | ICD-10-CM | POA: Diagnosis not present

## 2018-08-23 LAB — GLUCOSE, CAPILLARY
GLUCOSE-CAPILLARY: 61 mg/dL — AB (ref 70–99)
Glucose-Capillary: 32 mg/dL — CL (ref 70–99)
Glucose-Capillary: 153 mg/dL — ABNORMAL HIGH (ref 70–99)

## 2018-08-23 LAB — COMPREHENSIVE METABOLIC PANEL
ALBUMIN: 1.6 g/dL — AB (ref 3.5–5.0)
ALT: 89 U/L — ABNORMAL HIGH (ref 0–44)
AST: 135 U/L — ABNORMAL HIGH (ref 15–41)
Alkaline Phosphatase: 43 U/L (ref 38–126)
Anion gap: 14 (ref 5–15)
BILIRUBIN TOTAL: 1 mg/dL (ref 0.3–1.2)
BUN: 33 mg/dL — AB (ref 6–20)
CO2: 21 mmol/L — ABNORMAL LOW (ref 22–32)
Calcium: 7.5 mg/dL — ABNORMAL LOW (ref 8.9–10.3)
Chloride: 101 mmol/L (ref 98–111)
Creatinine, Ser: 3.4 mg/dL — ABNORMAL HIGH (ref 0.44–1.00)
GFR calc Af Amer: 17 mL/min — ABNORMAL LOW (ref >60)
GFR, EST NON AFRICAN AMERICAN: 15 mL/min — AB (ref >60)
GLUCOSE: 69 mg/dL — AB (ref 70–99)
Potassium: 3.1 mmol/L — ABNORMAL LOW (ref 3.5–5.1)
Sodium: 136 mmol/L (ref 135–145)
Total Protein: 5.3 g/dL — ABNORMAL LOW (ref 6.5–8.1)

## 2018-08-23 LAB — CBC WITH DIFFERENTIAL/PLATELET
BAND NEUTROPHILS: 0 %
Basophils Absolute: 0 10*3/uL (ref 0.0–0.1)
Basophils Relative: 0 %
Blasts: 0 %
Eosinophils Absolute: 0.2 10*3/uL (ref 0.0–0.5)
Eosinophils Relative: 3 %
HCT: 29.3 % — ABNORMAL LOW (ref 36.0–46.0)
Hemoglobin: 9.4 g/dL — ABNORMAL LOW (ref 12.0–15.0)
LYMPHS ABS: 0.9 10*3/uL (ref 0.7–4.0)
Lymphocytes Relative: 12 %
MCH: 31.2 pg (ref 26.0–34.0)
MCHC: 32.1 g/dL (ref 30.0–36.0)
MCV: 97.3 fL (ref 80.0–100.0)
Metamyelocytes Relative: 0 %
Monocytes Absolute: 0.1 10*3/uL (ref 0.1–1.0)
Monocytes Relative: 2 %
Myelocytes: 0 %
NEUTROS ABS: 6 10*3/uL (ref 1.7–7.7)
Neutrophils Relative %: 80 %
Other: 0 %
Platelets: UNDETERMINED 10*3/uL (ref 150–400)
Promyelocytes Relative: 3 %
RBC: 3.01 MIL/uL — ABNORMAL LOW (ref 3.87–5.11)
RDW: 13.9 % (ref 11.5–15.5)
WBC: 7.2 10*3/uL (ref 4.0–10.5)
nRBC: 0 /100 WBC
nRBC: 0.4 % — ABNORMAL HIGH (ref 0.0–0.2)

## 2018-08-23 LAB — PROTIME-INR
INR: 1.15
Prothrombin Time: 14.6 seconds (ref 11.4–15.2)

## 2018-08-23 LAB — POCT I-STAT 3, ART BLOOD GAS (G3+)
Acid-base deficit: 2 mmol/L (ref 0.0–2.0)
Bicarbonate: 21 mmol/L (ref 20.0–28.0)
O2 Saturation: 99 %
PCO2 ART: 30.6 mmHg — AB (ref 32.0–48.0)
Patient temperature: 99.1
TCO2: 22 mmol/L (ref 22–32)
pH, Arterial: 7.445 (ref 7.350–7.450)
pO2, Arterial: 122 mmHg — ABNORMAL HIGH (ref 83.0–108.0)

## 2018-08-23 LAB — CORTISOL: Cortisol, Plasma: 28.8 ug/dL

## 2018-08-23 LAB — APTT: aPTT: 31 seconds (ref 24–36)

## 2018-08-23 LAB — BRAIN NATRIURETIC PEPTIDE: B Natriuretic Peptide: 225.8 pg/mL — ABNORMAL HIGH (ref 0.0–100.0)

## 2018-08-23 LAB — LACTIC ACID, PLASMA: Lactic Acid, Venous: 2.9 mmol/L (ref 0.5–1.9)

## 2018-08-23 LAB — LIPASE, BLOOD: Lipase: 87 U/L — ABNORMAL HIGH (ref 11–51)

## 2018-08-23 LAB — AMYLASE: Amylase: 234 U/L — ABNORMAL HIGH (ref 28–100)

## 2018-08-23 LAB — MAGNESIUM: MAGNESIUM: 1.9 mg/dL (ref 1.7–2.4)

## 2018-08-23 LAB — PHOSPHORUS: Phosphorus: 4.8 mg/dL — ABNORMAL HIGH (ref 2.5–4.6)

## 2018-08-23 LAB — PROCALCITONIN: Procalcitonin: 44.96 ng/mL

## 2018-08-23 MED ORDER — FENTANYL BOLUS VIA INFUSION
50.0000 ug | INTRAVENOUS | Status: DC | PRN
  Filled 2018-08-23: qty 50

## 2018-08-23 MED ORDER — FENTANYL CITRATE (PF) 100 MCG/2ML IJ SOLN
50.0000 ug | Freq: Once | INTRAMUSCULAR | Status: AC
  Administered 2018-08-23: 50 ug via INTRAVENOUS

## 2018-08-23 MED ORDER — DEXTROSE 50 % IV SOLN
25.0000 g | INTRAVENOUS | Status: AC
  Administered 2018-08-23: 25 g via INTRAVENOUS

## 2018-08-23 MED ORDER — POTASSIUM CHLORIDE 10 MEQ/100ML IV SOLN
10.0000 meq | INTRAVENOUS | Status: AC
  Administered 2018-08-23 (×2): 10 meq via INTRAVENOUS
  Filled 2018-08-23 (×2): qty 100

## 2018-08-23 MED ORDER — CHLORHEXIDINE GLUCONATE 0.12% ORAL RINSE (MEDLINE KIT)
15.0000 mL | Freq: Two times a day (BID) | OROMUCOSAL | Status: DC
  Administered 2018-08-23 – 2018-09-04 (×23): 15 mL via OROMUCOSAL

## 2018-08-23 MED ORDER — SODIUM CHLORIDE 0.9 % IV BOLUS
500.0000 mL | Freq: Once | INTRAVENOUS | Status: AC
  Administered 2018-08-23: 500 mL via INTRAVENOUS

## 2018-08-23 MED ORDER — FENTANYL 2500MCG IN NS 250ML (10MCG/ML) PREMIX INFUSION
25.0000 ug/h | INTRAVENOUS | Status: DC
  Administered 2018-08-23: 150 ug/h via INTRAVENOUS
  Administered 2018-08-24: 325 ug/h via INTRAVENOUS
  Administered 2018-08-24: 175 ug/h via INTRAVENOUS
  Administered 2018-08-25: 200 ug/h via INTRAVENOUS
  Administered 2018-08-25: 325 ug/h via INTRAVENOUS
  Administered 2018-08-25: 250 ug/h via INTRAVENOUS
  Administered 2018-08-26: 200 ug/h via INTRAVENOUS
  Administered 2018-08-26: 300 ug/h via INTRAVENOUS
  Administered 2018-08-27: 200 ug/h via INTRAVENOUS
  Administered 2018-08-27: 375 ug/h via INTRAVENOUS
  Administered 2018-08-28: 275 ug/h via INTRAVENOUS
  Administered 2018-08-28: 300 ug/h via INTRAVENOUS
  Administered 2018-08-29 – 2018-08-31 (×3): 100 ug/h via INTRAVENOUS
  Administered 2018-08-31: 75 ug/h via INTRAVENOUS
  Administered 2018-09-01: 100 ug/h via INTRAVENOUS
  Administered 2018-09-02: 75 ug/h via INTRAVENOUS
  Administered 2018-09-03: 150 ug/h via INTRAVENOUS
  Administered 2018-09-03: 75 ug/h via INTRAVENOUS
  Filled 2018-08-23 (×19): qty 250

## 2018-08-23 MED ORDER — DEXMEDETOMIDINE HCL IN NACL 400 MCG/100ML IV SOLN
0.0000 ug/kg/h | INTRAVENOUS | Status: AC
  Administered 2018-08-23 – 2018-08-25 (×2): 1 ug/kg/h via INTRAVENOUS
  Administered 2018-08-25: 0.4 ug/kg/h via INTRAVENOUS
  Administered 2018-08-26: 0.8 ug/kg/h via INTRAVENOUS
  Filled 2018-08-23 (×3): qty 100

## 2018-08-23 MED ORDER — NOREPINEPHRINE BITARTRATE 1 MG/ML IV SOLN
0.0000 ug/min | INTRAVENOUS | Status: DC
  Administered 2018-08-23: 10 ug/min via INTRAVENOUS
  Administered 2018-08-24: 18 ug/min via INTRAVENOUS
  Filled 2018-08-23 (×2): qty 4

## 2018-08-23 MED ORDER — ORAL CARE MOUTH RINSE
15.0000 mL | OROMUCOSAL | Status: DC
  Administered 2018-08-23 – 2018-08-24 (×9): 15 mL via OROMUCOSAL
  Administered 2018-08-24: 07:00:00 via OROMUCOSAL
  Administered 2018-08-24 – 2018-09-04 (×104): 15 mL via OROMUCOSAL

## 2018-08-23 MED ORDER — HEPARIN SODIUM (PORCINE) 5000 UNIT/ML IJ SOLN
5000.0000 [IU] | Freq: Three times a day (TID) | INTRAMUSCULAR | Status: DC
  Administered 2018-08-24 – 2018-09-13 (×60): 5000 [IU] via SUBCUTANEOUS
  Filled 2018-08-23 (×61): qty 1

## 2018-08-23 NOTE — Progress Notes (Signed)
[facility] Progress Note Patient Name: Julie Williams DOB: 11/30/1965 MRN: [id number]   Date of Service  08/23/2018  HPI/Events of Note  Clarification regarding need for abdominal CT. Patient went down for head CT and bedside thought she was down for abdominal CT.  Patient does have history of recent rectal surgery and note of SBO.  eICU Interventions  Ordered CT of abdomen and pelvis without contrast. Surgery already consulted.     Intervention Category Minor Interventions: Other:  Judd Lien 08/23/2018, 9:40 PM

## 2018-08-23 NOTE — Telephone Encounter (Signed)
Call from [facility] - Dr Gerlean Ren 9:29 AM 08/23/2018   Rectal perforation - s/p surgery 08/19/18  and then aspirated and ARDS intubated yesterday. Now 100% fio2/peep 10 with po2 55-60. AKI with lasix and creat 1.7. Has CVL Left IJ. Not on pressors. Has foley. Accepted as cCM primary for ARDS mgmt. Needs inhouse ccs consult on arrival. Dr Reesa Chew will care link to facilitate     SIGNATURE    Dr. Brand Males, M.D., F.C.C.P,  Pulmonary and Critical Care Medicine Staff Physician, [place] Director - Interstitial Lung Disease  Program  Pulmonary [place] at [place], [place], [address]  Pager: [phone], If no answer or between  15:00h - 7:00h: call [phone] Telephone: [phone]  9:30 AM 08/23/2018

## 2018-08-23 NOTE — Progress Notes (Signed)
[facility] Progress Note Patient Name: Julie Williams DOB: 10/27/1965 MRN: [id number]   Date of Service  08/23/2018  HPI/Events of Note  Transfer from [place] for ARDS secondary to aspiration SBO. Now hypotensive 62/48 HR 68. Creatinine 3.4, K 3.1. Sedated with Fentanyl 150, Precedex 1  eICU Interventions   Hold sedation for now and if needed to resume Fentanyl at lower dose. Patient with history of opiate dependence.  Bolus 500 cc NS.  Replace K 20 meqs only and monitor BMET  Decreased PEEP to 10  Hypoglycemic at 32, correction ordered     Intervention Category Major Interventions: Hypotension - evaluation and management;Hypoxemia - evaluation and management  Judd Lien 08/23/2018, 7:32 PM
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
## 2018-08-24 ENCOUNTER — Inpatient Hospital Stay (HOSPITAL_COMMUNITY): Payer: PPO

## 2018-08-24 DIAGNOSIS — Z9911 Dependence on respirator [ventilator] status: Secondary | ICD-10-CM

## 2018-08-24 DIAGNOSIS — A419 Sepsis, unspecified organism: Secondary | ICD-10-CM

## 2018-08-24 DIAGNOSIS — Z9049 Acquired absence of other specified parts of digestive tract: Secondary | ICD-10-CM

## 2018-08-24 DIAGNOSIS — N179 Acute kidney failure, unspecified: Secondary | ICD-10-CM

## 2018-08-24 DIAGNOSIS — R7881 Bacteremia: Secondary | ICD-10-CM

## 2018-08-24 DIAGNOSIS — Z978 Presence of other specified devices: Secondary | ICD-10-CM

## 2018-08-24 DIAGNOSIS — Z933 Colostomy status: Secondary | ICD-10-CM

## 2018-08-24 DIAGNOSIS — Z87891 Personal history of nicotine dependence: Secondary | ICD-10-CM

## 2018-08-24 DIAGNOSIS — K760 Fatty (change of) liver, not elsewhere classified: Secondary | ICD-10-CM

## 2018-08-24 DIAGNOSIS — B952 Enterococcus as the cause of diseases classified elsewhere: Secondary | ICD-10-CM

## 2018-08-24 DIAGNOSIS — R6521 Severe sepsis with septic shock: Secondary | ICD-10-CM

## 2018-08-24 DIAGNOSIS — K631 Perforation of intestine (nontraumatic): Secondary | ICD-10-CM

## 2018-08-24 DIAGNOSIS — Z9989 Dependence on other enabling machines and devices: Secondary | ICD-10-CM

## 2018-08-24 LAB — POCT I-STAT 3, ART BLOOD GAS (G3+)
Acid-base deficit: 3 mmol/L — ABNORMAL HIGH (ref 0.0–2.0)
Bicarbonate: 20.9 mmol/L (ref 20.0–28.0)
O2 Saturation: 98 %
PCO2 ART: 30.6 mmHg — AB (ref 32.0–48.0)
Patient temperature: 98.2
TCO2: 22 mmol/L (ref 22–32)
pH, Arterial: 7.443 (ref 7.350–7.450)
pO2, Arterial: 91 mmHg (ref 83.0–108.0)

## 2018-08-24 LAB — COMPREHENSIVE METABOLIC PANEL
ALBUMIN: 1.7 g/dL — AB (ref 3.5–5.0)
ALT: 81 U/L — ABNORMAL HIGH (ref 0–44)
AST: 123 U/L — ABNORMAL HIGH (ref 15–41)
Alkaline Phosphatase: 48 U/L (ref 38–126)
Anion gap: 16 — ABNORMAL HIGH (ref 5–15)
BUN: 37 mg/dL — ABNORMAL HIGH (ref 6–20)
CO2: 19 mmol/L — ABNORMAL LOW (ref 22–32)
Calcium: 7.4 mg/dL — ABNORMAL LOW (ref 8.9–10.3)
Chloride: 101 mmol/L (ref 98–111)
Creatinine, Ser: 3.61 mg/dL — ABNORMAL HIGH (ref 0.44–1.00)
GFR calc Af Amer: 16 mL/min — ABNORMAL LOW (ref >60)
GFR calc non Af Amer: 14 mL/min — ABNORMAL LOW (ref >60)
Glucose, Bld: 103 mg/dL — ABNORMAL HIGH (ref 70–99)
Potassium: 3.5 mmol/L (ref 3.5–5.1)
Sodium: 136 mmol/L (ref 135–145)
Total Bilirubin: 1.2 mg/dL (ref 0.3–1.2)
Total Protein: 5.3 g/dL — ABNORMAL LOW (ref 6.5–8.1)

## 2018-08-24 LAB — CBC WITH DIFFERENTIAL/PLATELET
Band Neutrophils: 0 %
Basophils Absolute: 0.1 10*3/uL (ref 0.0–0.1)
Basophils Relative: 1 %
Blasts: 0 %
Eosinophils Absolute: 0.1 10*3/uL (ref 0.0–0.5)
Eosinophils Relative: 1 %
HCT: 27.8 % — ABNORMAL LOW (ref 36.0–46.0)
Hemoglobin: 9 g/dL — ABNORMAL LOW (ref 12.0–15.0)
LYMPHS ABS: 0.8 10*3/uL (ref 0.7–4.0)
Lymphocytes Relative: 9 %
MCH: 30.9 pg (ref 26.0–34.0)
MCHC: 32.4 g/dL (ref 30.0–36.0)
MCV: 95.5 fL (ref 80.0–100.0)
MYELOCYTES: 0 %
Metamyelocytes Relative: 0 %
Monocytes Absolute: 0.3 10*3/uL (ref 0.1–1.0)
Monocytes Relative: 4 %
NEUTROS PCT: 85 %
Neutro Abs: 7.3 10*3/uL (ref 1.7–7.7)
Other: 0 %
PLATELETS: 128 10*3/uL — AB (ref 150–400)
Promyelocytes Relative: 0 %
RBC: 2.91 MIL/uL — ABNORMAL LOW (ref 3.87–5.11)
RDW: 13.8 % (ref 11.5–15.5)
WBC: 8.6 10*3/uL (ref 4.0–10.5)
nRBC: 0 % (ref 0.0–0.2)
nRBC: 0 /100 WBC

## 2018-08-24 LAB — GLUCOSE, CAPILLARY
GLUCOSE-CAPILLARY: 105 mg/dL — AB (ref 70–99)
Glucose-Capillary: 109 mg/dL — ABNORMAL HIGH (ref 70–99)
Glucose-Capillary: 86 mg/dL (ref 70–99)
Glucose-Capillary: 83 mg/dL (ref 70–99)
Glucose-Capillary: 88 mg/dL (ref 70–99)
Glucose-Capillary: 112 mg/dL — ABNORMAL HIGH (ref 70–99)
Glucose-Capillary: 95 mg/dL (ref 70–99)

## 2018-08-24 LAB — DIFFERENTIAL
Basophils Absolute: 0 10*3/uL (ref 0.0–0.1)
Basophils Relative: 0 %
EOS PCT: 1 %
Eosinophils Absolute: 0.1 10*3/uL (ref 0.0–0.5)
Lymphocytes Relative: 10 %
Lymphs Abs: 1 10*3/uL (ref 0.7–4.0)
MONO ABS: 0.3 10*3/uL (ref 0.1–1.0)
Monocytes Relative: 3 %
Neutro Abs: 8.8 10*3/uL — ABNORMAL HIGH (ref 1.7–7.7)
Neutrophils Relative %: 86 %

## 2018-08-24 LAB — LACTIC ACID, PLASMA: Lactic Acid, Venous: 1.8 mmol/L (ref 0.5–1.9)

## 2018-08-24 LAB — URINE CULTURE: Culture: NO GROWTH

## 2018-08-24 LAB — CBC
HCT: 29.2 % — ABNORMAL LOW (ref 36.0–46.0)
Hemoglobin: 9.3 g/dL — ABNORMAL LOW (ref 12.0–15.0)
MCH: 30.8 pg (ref 26.0–34.0)
MCHC: 31.8 g/dL (ref 30.0–36.0)
MCV: 96.7 fL (ref 80.0–100.0)
Platelets: 126 10*3/uL — ABNORMAL LOW (ref 150–400)
RBC: 3.02 MIL/uL — ABNORMAL LOW (ref 3.87–5.11)
RDW: 13.9 % (ref 11.5–15.5)
WBC: 10.2 10*3/uL (ref 4.0–10.5)
nRBC: 0.4 % — ABNORMAL HIGH (ref 0.0–0.2)

## 2018-08-24 LAB — TRIGLYCERIDES: Triglycerides: 682 mg/dL — ABNORMAL HIGH (ref <150)

## 2018-08-24 LAB — PHOSPHORUS: Phosphorus: 4.4 mg/dL (ref 2.5–4.6)

## 2018-08-24 LAB — PREALBUMIN: Prealbumin: <5 mg/dL — ABNORMAL LOW (ref 18–38)

## 2018-08-24 LAB — MAGNESIUM: Magnesium: 1.9 mg/dL (ref 1.7–2.4)

## 2018-08-24 MED ORDER — DEXTROSE-NACL 5-0.9 % IV SOLN
INTRAVENOUS | Status: AC
  Administered 2018-08-24: 02:00:00 via INTRAVENOUS

## 2018-08-24 MED ORDER — VANCOMYCIN HCL IN DEXTROSE 1-5 GM/200ML-% IV SOLN: 1000.0000 mg | Freq: Once | INTRAVENOUS | Status: DC

## 2018-08-24 MED ORDER — TRAVASOL 10 % IV SOLN
INTRAVENOUS | Status: AC
  Administered 2018-08-24: 18:00:00 via INTRAVENOUS
  Filled 2018-08-24: qty 576

## 2018-08-24 MED ORDER — NOREPINEPHRINE 16 MG/250ML-% IV SOLN
0.0000 ug/min | INTRAVENOUS | Status: DC
  Administered 2018-08-24: 14 ug/min via INTRAVENOUS
  Filled 2018-08-24: qty 250

## 2018-08-24 MED ORDER — INSULIN ASPART 100 UNIT/ML ~~LOC~~ SOLN: 0.0000 [IU] | Freq: Four times a day (QID) | SUBCUTANEOUS | Status: DC

## 2018-08-24 MED ORDER — VANCOMYCIN HCL IN DEXTROSE 1-5 GM/200ML-% IV SOLN: 1000.0000 mg | Freq: Two times a day (BID) | INTRAVENOUS | Status: DC

## 2018-08-24 MED ORDER — DEXTROSE 50 % IV SOLN
INTRAVENOUS | Status: AC
  Filled 2018-08-24: qty 50

## 2018-08-24 MED ORDER — PIPERACILLIN-TAZOBACTAM IN DEX 2-0.25 GM/50ML IV SOLN
2.2500 g | Freq: Four times a day (QID) | INTRAVENOUS | Status: DC
  Administered 2018-08-24 – 2018-08-28 (×19): 2.25 g via INTRAVENOUS
  Filled 2018-08-24 (×20): qty 50

## 2018-08-24 MED ORDER — PANTOPRAZOLE SODIUM 40 MG IV SOLR
40.0000 mg | INTRAVENOUS | Status: DC
  Administered 2018-08-24 – 2018-08-25 (×2): 40 mg via INTRAVENOUS
  Filled 2018-08-24 (×2): qty 40

## 2018-08-24 NOTE — Progress Notes (Signed)
Pharmacy Antibiotic Note  Julie Williams is a 52 y.o. female admitted on 08/23/2018 with sepsis.  Pharmacy has been consulted for vancomycin dosing. Her last dose was 2gm IV on 12/29 @ 03:07 at [place]: Continue vancomycin 1gm IV q48 hours. Zosyn 2.25 gm IV q6 hours F/u renal function, cultures and clinical course  Weight: 165 lb 2 oz (74.9 kg)  Temp (24hrs), Avg:98.7 F (37.1 C), Min:98.2 F (36.8 C), Max:99.1 F (37.3 C)  Recent Labs  Lab 08/23/18 1834 08/23/18 2214  WBC 7.2  --   CREATININE 3.40*  --   LATICACIDVEN  --  2.9*    Estimated Creatinine Clearance: 19.2 mL/min (A) (by C-G formula based on SCr of 3.4 mg/dL (H)).    Allergies  Allergen Reactions  . Bee Venom Swelling     Thank you for allowing pharmacy to be a part of this patient's care.  Excell Seltzer P[place]et 08/24/2018 1:17 AM
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
## 2018-08-24 NOTE — Consult Note (Signed)
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 Signed   By: Ulyses Jarred M.D.   On: 08/23/2018 22:15  Dg Chest Port 1 View  Result Date: 08/24/2018 CLINICAL DATA:  Verify ETT/central line placement. EXAM: PORTABLE CHEST 1 VIEW COMPARISON:  08/23/2018. FINDINGS: Endotracheal tube, left IJ line, NG tube in stable position. Heart size normal. Diffuse left lung and right base pulmonary infiltrates are again noted. Similar findings noted on prior exam. Bibasilar  atelectasis. No prominent pleural effusions noted. No pneumothorax. Prior cervical spine fusion. IMPRESSION: 1.  Lines and tubes in stable position. 2. Bilateral pulmonary infiltrates and bibasilar atelectasis again noted. Similar findings noted on prior exam. Electronically Signed   By: Eden   On: 08/24/2018 05:57   Recent Results (from the past 240 hour(s))  Urine culture     Status: None   Collection Time: 08/23/18  5:36 PM  Result Value Ref Range Status   Specimen Description URINE, RANDOM  Final   Special Requests   Final    NONE Performed at [facility], [address] [place], [place] [address]    Culture NO GROWTH  Final   Report Status 08/24/2018 FINAL  Final  Culture, respiratory (tracheal aspirate)     Status: None (Preliminary result)   Collection Time: 08/23/18  5:36 PM  Result Value Ref Range Status   Specimen Description TRACHEAL ASPIRATE  Final   Special Requests NONE  Final   Gram Stain   Final    FEW WBC PRESENT,BOTH PMN AND MONONUCLEAR FEW YEAST WITH PSEUDOHYPHAE Performed at [facility], [place] [address], [place], [place] [address]    Culture FEW YEAST  Final   Report Status PENDING  Incomplete    Microbiology: Recent Results (from the past 240 hour(s))  Urine culture     Status: None   Collection Time: 08/23/18  5:36 PM  Result Value Ref Range Status   Specimen Description URINE, RANDOM  Final   Special Requests   Final    NONE Performed at [facility], [place] [address], [place], [place] [address]    Culture NO GROWTH  Final   Report Status 08/24/2018 FINAL  Final  Culture, respiratory (tracheal aspirate)     Status: None (Preliminary result)   Collection Time: 08/23/18  5:36 PM  Result Value Ref Range Status   Specimen Description TRACHEAL ASPIRATE  Final   Special Requests NONE  Final   Gram Stain   Final    FEW WBC PRESENT,BOTH PMN AND MONONUCLEAR FEW YEAST WITH PSEUDOHYPHAE Performed at [facility], [place] [address] [place], [place] [address]    Culture FEW YEAST  Final   Report Status PENDING  Incomplete    Radiographs and labs were personally reviewed by me.   Bobby Rumpf, MD [facility] [phone] 08/24/2018, 2:19 PM
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
## 2018-08-24 NOTE — Progress Notes (Signed)
PHARMACY - ADULT TOTAL PARENTERAL NUTRITION CONSULT NOTE   Pharmacy Consult for TPN Indication: Prolonged Ileus  Patient Measurements: Weight: 165 lb 9.1 oz (75.1 kg)   Body mass index is 27.55 kg/m.  Assessment:  52 yo F presents on 12/29 from [facility] with rectal perforation. S/p surgery on 12/25 and then aspirated and intubated due to ARDS. Surgery will be consulted for SBO/perforation. Pharmacy consulted to start TPN.  GI: Would consider reason for TPN prolonged NPO as imaging shows no signs of ileus. S/p partial rectal resection, LL quadrant colostomy, and placement of perc drain. Albumin low at 1.7, prealbumin < 5. Last BM was 12/29. Endo: CBGs controlled < 150. No SSI Insulin requirements in the past 24 hours: 0 units Lytes: wnl on 12/29, K up to 3.5. CoCa 9.4 Renal: AKI, SCr up to 3.61, BUN up to 33. UOP is good. D5NS at 26m/hr Pulm: Intubated Cards:  BP wnl on Levophed at 8 Hepatobil: AST and ALT elevated, Alk phos ok. Tbili 1. Trig elevated at 682. Neuro: On fentanyl gtt. Precedex weaned off. ID: On Zosyn and vancomycin for peritonitis. Tmax of 99.1, WBC wnl.  TPN Access: CVC Triple Lumen >> TPN start date: 12/30 >> Nutritional Goals (per RD recommendation on *): KCal: 2,000-2,150  Protein: 110-120 g Fluid: < 2 L per day  Adjust goals per RD recs  Current Nutrition:  NPO  Plan:  Start TPN at 451mhr. Hold 20% lipid emulsion for first 7 days for ICU patients per ASPEN guidelines (Start date by 1/6) This TPN provides 58 g of protein, 144 g of dextrose, and 0 g of lipids which provides 720 kCals per day, meeting ~40% of patient needs. Will titrate up to goal as tolerated Electrolytes in TPN: standard concentration exc lower K and Phos. Max Acetate Add MVI and trace elements to TPN Add sensitive SSI and adjust as needed Stop D5NS tonight when TPN starts Monitor TPN labs, Bmet, Mg, and Phos tomorrow F/U surgery recs, RD recs  NaElenor QuinonesPharmD,  BCPS, [facility] [employer] Pharmacist Phone number #[phone]/30/2019 9:51 AM

## 2018-08-24 NOTE — Consult Note (Signed)
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 Vascular: No abnormal hyperdensity of the major intracranial arteries or dural venous sinuses. No intracranial atherosclerosis. Skull: The visualized skull base, calvarium and extracranial soft tissues are normal. Sinuses/Orbits: No fluid levels or advanced mucosal thickening of the visualized paranasal sinuses. No mastoid or middle ear effusion. The orbits are normal. IMPRESSION: Normal head CT. Electronically Signed   By: Ulyses Jarred M.D.   On: 08/23/2018 22:15   Dg Chest Port 1 View  Result Date: 08/24/2018 CLINICAL DATA:  Verify ETT/central line placement.  EXAM: PORTABLE CHEST 1 VIEW COMPARISON:  08/23/2018. FINDINGS: Endotracheal tube, left IJ line, NG tube in stable position. Heart size normal. Diffuse left lung and right base pulmonary infiltrates are again noted. Similar findings noted on prior exam. Bibasilar atelectasis. No prominent pleural effusions noted. No pneumothorax. Prior cervical spine fusion. IMPRESSION: 1.  Lines and tubes in stable position. 2. Bilateral pulmonary infiltrates and bibasilar atelectasis again noted. Similar findings noted on prior exam. Electronically Signed  By: [place]   On: 08/24/2018 05:57   Anti-infectives (From admission, onward)   Start     Dose/Rate Route Frequency Ordered Stop   08/25/18 0300  vancomycin (VANCOCIN) IVPB 1000 mg/200 mL premix  Status:  Discontinued     1,000 mg 200 mL/hr over 60 Minutes Intravenous  Once 08/24/18 0122 08/24/18 1049   08/24/18 0100  vancomycin (VANCOCIN) IVPB 1000 mg/200 mL premix  Status:  Discontinued     1,000 mg 200 mL/hr over 60 Minutes Intravenous Every 12 hours 08/24/18 0045 08/24/18 0122   08/24/18 0100  piperacillin-tazobactam (ZOSYN) IVPB 2.25 g     2.25 g 100 mL/hr over 30 Minutes Intravenous Every 6 hours 08/24/18 0045          Assessment/Plan HTN GERD Depression Alcohol abuse Chronic pain Former smoker VDRF/ARDS   Perforated colon/rectum S/p exploratory laparotomy, resection distal sigmoid colon and proximal rectum, diverting colostomy, wound vac placement 12/25 Dr. Noberto Retort - POD 5 - surgical path?  - wound vac changes MWF - continue JP drain and monitor output - no output from colostomy yet  ID - zosyn 12/30>>, vancomycin 12/30>>12/30 VTE - SCDs, sq heparin FEN - IVF, NPO/OGT, TPN Foley - in place  Plan - Continue NPO/OG tube to LIWS and await return in bowel function prior to starting enteral nutrition. Agree with starting TPN for nutritional support. [place] RN to see for wound vac changes (please page me during vac change today so  I can assess the wound) and ostomy care. Would not recommend any intervention for residual material in the anterior and posterior pelvis at this time, continue IV zosyn and JP drain.  Wellington Hampshire, [facility] Surgery 08/24/2018, 11:24 AM Pager: [phone] Mon 7:00 am -11:30 AM Tues-Fri 7:00 am-4:30 pm Sat-Sun 7:00 am-11:30 am

## 2018-08-25 ENCOUNTER — Inpatient Hospital Stay (HOSPITAL_COMMUNITY): Payer: PPO

## 2018-08-25 DIAGNOSIS — E43 Unspecified severe protein-calorie malnutrition: Secondary | ICD-10-CM

## 2018-08-25 DIAGNOSIS — N179 Acute kidney failure, unspecified: Secondary | ICD-10-CM

## 2018-08-25 DIAGNOSIS — J9809 Other diseases of bronchus, not elsewhere classified: Secondary | ICD-10-CM

## 2018-08-25 LAB — BASIC METABOLIC PANEL
Anion gap: 10 (ref 5–15)
BUN: 47 mg/dL — ABNORMAL HIGH (ref 6–20)
CHLORIDE: 105 mmol/L (ref 98–111)
CO2: 22 mmol/L (ref 22–32)
Calcium: 7.5 mg/dL — ABNORMAL LOW (ref 8.9–10.3)
Creatinine, Ser: 4.36 mg/dL — ABNORMAL HIGH (ref 0.44–1.00)
GFR calc Af Amer: 12 mL/min — ABNORMAL LOW (ref >60)
GFR calc non Af Amer: 11 mL/min — ABNORMAL LOW (ref >60)
Glucose, Bld: 118 mg/dL — ABNORMAL HIGH (ref 70–99)
POTASSIUM: 3.3 mmol/L — AB (ref 3.5–5.1)
Sodium: 137 mmol/L (ref 135–145)

## 2018-08-25 LAB — GLUCOSE, CAPILLARY
GLUCOSE-CAPILLARY: 108 mg/dL — AB (ref 70–99)
Glucose-Capillary: 103 mg/dL — ABNORMAL HIGH (ref 70–99)
Glucose-Capillary: 110 mg/dL — ABNORMAL HIGH (ref 70–99)
Glucose-Capillary: 80 mg/dL (ref 70–99)
Glucose-Capillary: 90 mg/dL (ref 70–99)
Glucose-Capillary: 96 mg/dL (ref 70–99)

## 2018-08-25 LAB — CBC WITH DIFFERENTIAL/PLATELET
Abs Immature Granulocytes: 0.48 10*3/uL — ABNORMAL HIGH (ref 0.00–0.07)
Basophils Absolute: 0 10*3/uL (ref 0.0–0.1)
Basophils Relative: 0 %
Eosinophils Absolute: 0.2 10*3/uL (ref 0.0–0.5)
Eosinophils Relative: 2 %
HCT: 26.5 % — ABNORMAL LOW (ref 36.0–46.0)
Hemoglobin: 8.4 g/dL — ABNORMAL LOW (ref 12.0–15.0)
Immature Granulocytes: 5 %
Lymphocytes Relative: 12 %
Lymphs Abs: 1.2 10*3/uL (ref 0.7–4.0)
MCH: 31.1 pg (ref 26.0–34.0)
MCHC: 31.7 g/dL (ref 30.0–36.0)
MCV: 98.1 fL (ref 80.0–100.0)
Monocytes Absolute: 0.3 10*3/uL (ref 0.1–1.0)
Monocytes Relative: 3 %
Neutro Abs: 7.5 10*3/uL (ref 1.7–7.7)
Neutrophils Relative %: 78 %
Platelets: 140 10*3/uL — ABNORMAL LOW (ref 150–400)
RBC: 2.7 MIL/uL — ABNORMAL LOW (ref 3.87–5.11)
RDW: 14.6 % (ref 11.5–15.5)
WBC Morphology: INCREASED
WBC: 9.7 10*3/uL (ref 4.0–10.5)
nRBC: 0 % (ref 0.0–0.2)

## 2018-08-25 LAB — CULTURE, RESPIRATORY W GRAM STAIN

## 2018-08-25 LAB — PHOSPHORUS: Phosphorus: 3.9 mg/dL (ref 2.5–4.6)

## 2018-08-25 LAB — MAGNESIUM: Magnesium: 2.1 mg/dL (ref 1.7–2.4)

## 2018-08-25 LAB — LACTIC ACID, PLASMA: Lactic Acid, Venous: 1.3 mmol/L (ref 0.5–1.9)

## 2018-08-25 LAB — HIV ANTIBODY (ROUTINE TESTING W REFLEX): HIV Screen 4th Generation wRfx: NONREACTIVE

## 2018-08-25 MED ORDER — VITAL HIGH PROTEIN PO LIQD
1000.0000 mL | ORAL | Status: DC
  Administered 2018-08-25 – 2018-08-27 (×3): 1000 mL

## 2018-08-25 MED ORDER — SODIUM CHLORIDE 0.9 % IV SOLN
INTRAVENOUS | Status: DC | PRN
  Administered 2018-08-25: 1000 mL via INTRAVENOUS
  Administered 2018-08-27 – 2018-09-03 (×2): 500 mL via INTRAVENOUS
  Administered 2018-09-05: 1000 mL via INTRAVENOUS

## 2018-08-25 MED ORDER — MIDAZOLAM HCL 2 MG/2ML IJ SOLN
2.0000 mg | Freq: Once | INTRAMUSCULAR | Status: AC
  Administered 2018-08-25: 2 mg via INTRAVENOUS
  Filled 2018-08-25: qty 2

## 2018-08-25 MED ORDER — POTASSIUM CHLORIDE 10 MEQ/50ML IV SOLN
10.0000 meq | INTRAVENOUS | Status: AC
  Administered 2018-08-25 (×4): 10 meq via INTRAVENOUS
  Filled 2018-08-25 (×4): qty 50

## 2018-08-25 MED ORDER — LACTATED RINGERS IV SOLN
INTRAVENOUS | Status: DC
  Administered 2018-08-25 – 2018-08-27 (×4): via INTRAVENOUS

## 2018-08-25 MED ORDER — TRAVASOL 10 % IV SOLN
INTRAVENOUS | Status: DC
  Filled 2018-08-25: qty 897.6

## 2018-08-25 MED ORDER — CHLORHEXIDINE GLUCONATE CLOTH 2 % EX PADS
6.0000 | MEDICATED_PAD | Freq: Every day | CUTANEOUS | Status: DC
  Administered 2018-08-26: 6 via TOPICAL

## 2018-08-25 MED ORDER — SODIUM CHLORIDE 0.9 % IV BOLUS
500.0000 mL | Freq: Once | INTRAVENOUS | Status: AC
  Administered 2018-08-25: 500 mL via INTRAVENOUS

## 2018-08-25 MED ORDER — SODIUM CHLORIDE 0.9% FLUSH
10.0000 mL | Freq: Two times a day (BID) | INTRAVENOUS | Status: DC
  Administered 2018-08-26 – 2018-09-12 (×24): 10 mL
  Administered 2018-09-12: 20 mL
  Administered 2018-09-13: 10 mL

## 2018-08-25 MED ORDER — PANTOPRAZOLE SODIUM 40 MG PO PACK
40.0000 mg | PACK | Freq: Every day | ORAL | Status: DC
  Administered 2018-08-25 – 2018-09-01 (×8): 40 mg
  Filled 2018-08-25 (×9): qty 20

## 2018-08-25 MED ORDER — MIDAZOLAM HCL 2 MG/2ML IJ SOLN
2.0000 mg | INTRAMUSCULAR | Status: DC | PRN
  Administered 2018-08-25 – 2018-09-02 (×37): 2 mg via INTRAVENOUS
  Administered 2018-09-03: 1 mg via INTRAVENOUS
  Administered 2018-09-03: 2 mg via INTRAVENOUS
  Filled 2018-08-25 (×40): qty 2

## 2018-08-25 MED ORDER — SODIUM CHLORIDE 0.9% FLUSH
10.0000 mL | INTRAVENOUS | Status: DC | PRN
  Administered 2018-09-10: 10 mL
  Filled 2018-08-25: qty 40

## 2018-08-25 NOTE — Progress Notes (Signed)
Patient became very agitated and trashing in the bed, SpO2 in the 60's, respiratory therapist bedside started bagging patient, BP 149/90, HR 140's.  Provider paged to make aware.

## 2018-08-25 NOTE — Consult Note (Signed)
[place] Nurse wound consult note Reason for Consult: NPWT dressing change, patient transferred from [place]. Last reported NPWT dressing change 08/22/18. Met CCS PA Saverio Danker to assess wound and stoma Wound type: surgical  Pressure Injury POA: NA Measurement: 14cm x 3cm x 4cm  Wound bed:80% pink, subcutaneous tissue, 20% fat necrosis centrally Drainage (amount, consistency, odor) minimal in cannister Periwound: intact some redness noted along the bilateral hips and groin, but reported to be improved today, blanches Dressing procedure/placement/frequency: Removed old NPWT dressing Filled wound with ___1___ piece black foam. Sealed NPWT dressing at 173m HG/1024mG    [place] continue to provide NPWT dressing changed due to the complexity of the dressing change. Will plan next dressing change 08/28/18.  Orders updated for NPWT dressing per VO KeSaverio DankerA.  W[place]urse ostomy consult note Stoma type/location: LLQ, end colostomy, surgery performed at outside hospital Stomal assessment/size: 2" round, budded, pink, moist Reported to be more dusky today, but stoma appears viable with no concerns for viability per CCS PA or WOC today Peristomal assessment: NA, new pouch in place  Treatment options for stomal/peristomal skin: NA Output none at the time of my assessment, only some bloody drainage.  Ostomy pouching: 1pc./2pc.  Education provided: patient sedated on the vent, no family in the room.  En[place] patient in [place]: No   WOC Nurse will follow along with you for continued support with ostomy teaching and care [place]SN, RNHintonCWNavarre BeachCNSwall MeadowsC[place]oll[place]

## 2018-08-25 NOTE — Progress Notes (Signed)
Pharmacy is unable to confirm the medications the patient was taking at home. All options have been exhausted and a resolution to the situation is not expected.   She brought a bag of medications to the hospital with here and they are currently stored in the pharmacy.  Where possible, their outpatient pharmacy(s) have been contacted for the last time prescriptions were filled and that information has been added to each medication in an Order Note (highlighted yellow below the medication).  Please contact pharmacy if further assistance is needed.   Romeo Rabon, PharmD. Mobile: [phone]. 08/25/2018,12:42 PM.

## 2018-08-25 NOTE — Progress Notes (Signed)
Subjective/Chief Complaint: on vent Pt intubated sedated    Objective: Vital signs in last 24 hours: Temp:  [97.9 F (36.6 C)-99 F (37.2 C)] 97.9 F (36.6 C) (12/31 0742) Pulse Rate:  [92-98] 92 (12/31 0757) Resp:  [0-29] 18 (12/31 0757) BP: (95-130)/(63-82) 110/73 (12/31 0600) SpO2:  [92 %-99 %] 99 % (12/31 0757) FiO2 (%):  [40 %] 40 % (12/31 0757) Weight:  [75.9 kg] 75.9 kg (12/31 0401) Last BM Date: 08/23/18  Intake/Output from previous day: 12/30 0701 - 12/31 0700 In: 1530.3 [I.V.:1396.8; IV Piggyback:133.6] Out: 1710 [Urine:1605; Emesis/NG output:30; Drains:75] Intake/Output this shift: No intake/output data recorded.  GI: wound vac in place  ostomy viable with minimal duskiness   Lab Results:  Recent Labs    08/24/18 1130 08/25/18 0407  WBC 8.6 9.7  HGB 9.0* 8.4*  HCT 27.8* 26.5*  PLT 128* 140*   BMET Recent Labs    08/24/18 0240 08/25/18 0407  NA 136 137  K 3.5 3.3*  CL 101 105  CO2 19* 22  GLUCOSE 103* 118*  BUN 37* 47*  CREATININE 3.61* 4.36*  CALCIUM 7.4* 7.5*   PT/INR Recent Labs    08/23/18 1834  LABPROT 14.6  INR 1.15   ABG Recent Labs    08/23/18 1745 08/24/18 0256  PHART 7.445 7.443  HCO3 21.0 20.9    Studies/Results: Ct Abdomen Pelvis Wo Contrast  Result Date: 08/23/2018 CLINICAL DATA:  Rectal perforation EXAM: CT ABDOMEN AND PELVIS WITHOUT CONTRAST TECHNIQUE: Multidetector CT imaging of the abdomen and pelvis was performed following the standard protocol without IV contrast. COMPARISON:  08/19/2018 FINDINGS: LOWER CHEST: There is bibasilar consolidation within both lower lobes and the right middle lobe. HEPATOBILIARY: Diffuse hypoattenuation of the liver relative to the spleen suggests hepatic steatosis. No focal liver lesion or biliary dilatation. The gallbladder is normal. PANCREAS: The pancreatic parenchymal contours are normal and there is no ductal dilatation. There is no peripancreatic fluid collection. SPLEEN:  Spleen is normal. Small amount of perisplenic ascites. ADRENALS/URINARY TRACT: --Adrenal glands: Normal. --Right kidney/ureter: No hydronephrosis, nephroureterolithiasis, perinephric stranding or solid renal mass. --Left kidney/ureter: No hydronephrosis, nephroureterolithiasis, perinephric stranding or solid renal mass. --Urinary bladder: Normal for degree of distention STOMACH/BOWEL: --Stomach/Duodenum: Enteric tube tip in the stomach. Normal duodenal course. --Small bowel: No dilatation or inflammation. --Colon: Status post partial resection of the rectum. There is a percutaneous drain in the low pelvis via a right lower quadrant approach. There is a small amount of residual gas and fluid in the low pelvis, both anteriorly and posteriorly, but markedly reduced from the prior study. There is a left lower quadrant colostomy. There is fluid throughout the colon. --Appendix: Not visualized. No right lower quadrant inflammation or free fluid. VASCULAR/LYMPHATIC: Normal course and caliber of the major abdominal vessels. No abdominal or pelvic lymphadenopathy. REPRODUCTIVE: Normal uterus and ovaries. MUSCULOSKELETAL. No bony spinal canal stenosis or focal osseous abnormality. OTHER: Midline anterior abdominal wall surgical defect. IMPRESSION: 1. Status post partial rectal resection, left lower quadrant colostomy, and placement of percutaneous drainage catheter with marked reduction in the amount of fluid, debris and gas in the pelvis. Small amount of residual material in the anterior and posterior pelvis. 2. Bibasilar pulmonary consolidation. 3. Hepatic steatosis. Electronically Signed   By: Ulyses Jarred M.D.   On: 08/23/2018 22:28   Dg Chest 1 View  Result Date: 08/23/2018 CLINICAL DATA:  Endotracheal tube. EXAM: CHEST  1 VIEW COMPARISON:  Radiographs of same day. FINDINGS: Stable cardiomediastinal silhouette. Endotracheal  and nasogastric tubes are unchanged in position. Left internal jugular catheter is noted  with distal tip in expected position of right atrium. This is unchanged compared to prior exam. No pneumothorax or significant pleural effusion is noted. Stable diffuse left lung opacity is noted as well as right middle lobe opacity concerning for pneumonia. IMPRESSION: Stable support apparatus.  Stable bilateral lung opacities. Electronically Signed   By: Marijo Conception, M.D.   On: 08/23/2018 18:24   Dg Abd 1 View  Result Date: 08/23/2018 CLINICAL DATA:  Ileus. EXAM: ABDOMEN - 1 VIEW COMPARISON:  Radiograph of same day. FINDINGS: The bowel gas pattern is normal. No radio-opaque calculi or other significant radiographic abnormality are seen. Distal tip of nasogastric tube is seen in proximal stomach. Stable position of pelvic drainage catheter. IMPRESSION: No evidence of bowel obstruction or ileus. Ostomy seen in left lower quadrant. Nasogastric tube tip seen in proximal stomach. Electronically Signed   By: Marijo Conception, M.D.   On: 08/23/2018 18:25   Ct Head Wo Contrast  Result Date: 08/23/2018 CLINICAL DATA:  Altered mental status EXAM: CT HEAD WITHOUT CONTRAST TECHNIQUE: Contiguous axial images were obtained from the base of the skull through the vertex without intravenous contrast. COMPARISON:  Head CT 04/24/2009 FINDINGS: Brain: There is no mass, hemorrhage or extra-axial collection. The size and configuration of the ventricles and extra-axial CSF spaces are normal. The brain parenchyma is normal, without evidence of acute or chronic infarction. Vascular: No abnormal hyperdensity of the major intracranial arteries or dural venous sinuses. No intracranial atherosclerosis. Skull: The visualized skull base, calvarium and extracranial soft tissues are normal. Sinuses/Orbits: No fluid levels or advanced mucosal thickening of the visualized paranasal sinuses. No mastoid or middle ear effusion. The orbits are normal. IMPRESSION: Normal head CT. Electronically Signed   By: Ulyses Jarred M.D.   On:  08/23/2018 22:15   Dg Chest Port 1 View  Result Date: 08/25/2018 CLINICAL DATA:  ARDS. EXAM: PORTABLE CHEST 1 VIEW COMPARISON:  08/24/2018. FINDINGS: Endotracheal tube, left IJ line, NG tube in stable position. Heart size stable. Bilateral atelectasis/infiltrates, left side greater than right again noted without interim change. No pleural effusion or pneumothorax. Heart size stable. Prior cervical spine fusion. IMPRESSION: 1.  Lines and tubes in stable position. 2. Bilateral atelectasis/infiltrates, left side greater than right again noted. No interim change. Electronically Signed   By: Marcello Moores  Register   On: 08/25/2018 05:21   Dg Chest Port 1 View  Result Date: 08/24/2018 CLINICAL DATA:  Verify ETT/central line placement. EXAM: PORTABLE CHEST 1 VIEW COMPARISON:  08/23/2018. FINDINGS: Endotracheal tube, left IJ line, NG tube in stable position. Heart size normal. Diffuse left lung and right base pulmonary infiltrates are again noted. Similar findings noted on prior exam. Bibasilar atelectasis. No prominent pleural effusions noted. No pneumothorax. Prior cervical spine fusion. IMPRESSION: 1.  Lines and tubes in stable position. 2. Bilateral pulmonary infiltrates and bibasilar atelectasis again noted. Similar findings noted on prior exam. Electronically Signed   By: [place]   On: 08/24/2018 05:57    Anti-infectives: Anti-infectives (From admission, onward)   Start     Dose/Rate Route Frequency Ordered Stop   08/25/18 0300  vancomycin (VANCOCIN) IVPB 1000 mg/200 mL premix  Status:  Discontinued     1,000 mg 200 mL/hr over 60 Minutes Intravenous  Once 08/24/18 0122 08/24/18 1049   08/24/18 0100  vancomycin (VANCOCIN) IVPB 1000 mg/200 mL premix  Status:  Discontinued     1,000  mg 200 mL/hr over 60 Minutes Intravenous Every 12 hours 08/24/18 0045 08/24/18 0122   08/24/18 0100  piperacillin-tazobactam (ZOSYN) IVPB 2.25 g     2.25 g 100 mL/hr over 30 Minutes Intravenous Every 6 hours  08/24/18 0045        Assessment/Plan: Patient Active Problem List   Diagnosis Date Noted  . ARDS (adult respiratory distress syndrome) ([place]) 08/23/2018  . Elevated blood pressure reading 08/16/2018  . Wheezing 04/08/2018  . Chest pain 12/14/2017  . Osteoarthritis of right acromioclavicular joint 11/10/2017  . Agitation 09/07/2017  . Vaginal atrophy 07/05/2016  . Throat discomfort 05/30/2016  . Preop cardiovascular exam 05/20/2016  . Acromioclavicular joint arthritis 12/25/2015  . Essential hypertension 07/10/2015  . Cervical spine degeneration 06/19/2015  . S/P complete repair of rotator cuff 04/13/2015  . Complete tear of right rotator cuff 01/06/2015  . S/P cervical spinal fusion 01/06/2015  . GERD (gastroesophageal reflux disease) 11/07/2014  . Hoarseness 04/08/2013  . Hearing loss 03/04/2013  . Tonsil pain 03/04/2013  . History of colonic polyps 09/13/2011  . Hypertension with goal to be determined 09/13/2011  . Tobacco use disorder 07/10/2011  . Long term current use of opiate analgesic 06/04/2011  . Chronic neck pain 06/04/2011  . Pain in right shoulder 06/04/2011  . Depression 07/17/2009  . Rotator cuff syndrome of right shoulder 06/29/2009       6 days S/P colectomy / colostomy from [facility] for rectal ulcer    Ok to start tube feeds if stable  No evidence of obstruction or ileus  Wound care with vac  WOCN     LOS: 2 days    Joyice Faster Douglas Smolinsky 08/25/2018

## 2018-08-25 NOTE — Progress Notes (Addendum)
Addendum: Decision made to stop TPN after today's bag and utilize tube feeds.  TPN order cancelled for 12/31.   PHARMACY - ADULT TOTAL PARENTERAL NUTRITION CONSULT NOTE   Pharmacy Consult for TPN Indication: Prolonged Ileus  Patient Measurements: Weight: 167 lb 5.3 oz (75.9 kg)   Body mass index is 27.85 kg/m.  Assessment:  52 yo F presents on 12/29 from [facility] with rectal perforation. S/p surgery on 12/25 and then aspirated and intubated due to ARDS. Surgery will be consulted for SBO/perforation. Pharmacy consulted to start TPN. Patient has been NPO x7 days and is at risk for refeeding.   GI: Would consider reason for TPN prolonged NPO as imaging shows no signs of ileus. S/p partial rectal resection, LL quadrant colostomy, and placement of perc drain. JP drain output 50 mL. Abd wound vac output 25 mL. NG output 30 mL. Plan start tube feeds today.  Albumin low at 1.7, prealbumin < 5. Last BM was 12/29. IV PPI.  Endo: No history of DM. CBGs controlled < 150. SSI. Insulin requirements in the past 24 hours: 0 units Lytes: K down to 3.3 -giving 4 runs this AM. CoCa 9.3. Others wnl. Will watch Phos as trending down.  Renal: AKI, SCr up to 4.36, BUN up to 47. UOP is good. D5NS at 77m/hr Pulm: Intubated Cards:  BP wnl, off pressors.  Hepatobil: AST and ALT elevated, Alk phos ok. Tbili 1. Trig elevated at 682. Neuro: On fentanyl gtt- weaning. Precedex off. ID: On Zosyn for peritonitis. Vanc stopped 12/30. Afebrile, WBC wnl. [place] blood culture with enterococcus faecalis (sens vanc/pcn/amp) +/- GNR. Cultures here pending.   TPN Access: CVC Triple Lumen >> TPN start date: 12/30 >> Nutritional Goals (per RD recommendation on 12/30): KCal: 1620 Protein: 100-125 g Fluid: 1.8 L per day  Current Nutrition:  NPO  Plan:  Increase TPN to 55 mL/hr. Increasing slowly due to risk of refeeding.  Hold 20% lipid emulsion for first 7 days for ICU patients per ASPEN guidelines (Start  date by 1/6) This TPN provides 90 g of protein, 238 g of dextrose, and 0 g of lipids which provides 1167 kCals per day, meeting ~40% of patient needs. Will titrate up to goal as tolerated Electrolytes in TPN: standard concentration exc lower K and Phos. Max Acetate. - No changes today.  Add MVI and trace elements to TPN Continue sensitive SSI and adjust as needed Monitor TPN labs, repeat Bmet, Mg, and Phos tomorrow F/U toleration of tube feeds and ability to wean TPN  JSloan Leiter PharmD, BCPS, BCCCP Clinical Pharmacist  Please refer to ABald Mountain Surgical Centerfor MTropicnumbers 08/25/2018 7:13 AM

## 2018-08-25 NOTE — Progress Notes (Signed)
INFECTIOUS DISEASE PROGRESS NOTE  ID: Julie Williams is a 52 y.o. female with  Active Problems:   ARDS (adult respiratory distress syndrome) (HCC)  Subjective: Awakens during exam, responds to voice.   Abtx:  Anti-infectives (From admission, onward)   Start     Dose/Rate Route Frequency Ordered Stop   08/25/18 0300  vancomycin (VANCOCIN) IVPB 1000 mg/200 mL premix  Status:  Discontinued     1,000 mg 200 mL/hr over 60 Minutes Intravenous  Once 08/24/18 0122 08/24/18 1049   08/24/18 0100  vancomycin (VANCOCIN) IVPB 1000 mg/200 mL premix  Status:  Discontinued     1,000 mg 200 mL/hr over 60 Minutes Intravenous Every 12 hours 08/24/18 0045 08/24/18 0122   08/24/18 0100  piperacillin-tazobactam (ZOSYN) IVPB 2.25 g     2.25 g 100 mL/hr over 30 Minutes Intravenous Every 6 hours 08/24/18 0045        Medications:  Scheduled: . chlorhexidine gluconate (MEDLINE KIT)  15 mL Mouth Rinse BID  . feeding supplement (VITAL HIGH PROTEIN)  1,000 mL Per Tube Q24H  . heparin  5,000 Units Subcutaneous Q8H  . insulin aspart  0-9 Units Subcutaneous Q6H  . mouth rinse  15 mL Mouth Rinse 10 times per day  . pantoprazole sodium  40 mg Per Tube Daily    Objective: Vital signs in last 24 hours: Temp:  [97.9 F (36.6 C)-99 F (37.2 C)] 97.9 F (36.6 C) (12/31 0742) Pulse Rate:  [92-98] 92 (12/31 0757) Resp:  [0-29] 18 (12/31 0845) BP: (95-110)/(63-79) 96/70 (12/31 0830) SpO2:  [92 %-99 %] 96 % (12/31 0845) FiO2 (%):  [40 %] 40 % (12/31 0757) Weight:  [75.9 kg] 75.9 kg (12/31 0401)   General appearance: no distress Resp: clear to auscultation bilaterally Cardio: regular rate and rhythm GI: normal findings: bowel sounds normal and abnormal findings:  tender. gaurding. wound vac in place.   Lab Results Recent Labs    08/24/18 0240 08/24/18 1130 08/25/18 0407  WBC 10.2 8.6 9.7  HGB 9.3* 9.0* 8.4*  HCT 29.2* 27.8* 26.5*  NA 136  --  137  K 3.5  --  3.3*  CL 101  --  105  CO2 19*   --  22  BUN 37*  --  47*  CREATININE 3.61*  --  4.36*   Liver Panel Recent Labs    08/23/18 1834 08/24/18 0240  PROT 5.3* 5.3*  ALBUMIN 1.6* 1.7*  AST 135* 123*  ALT 89* 81*  ALKPHOS 43 48  BILITOT 1.0 1.2   Sedimentation Rate No results for input(s): ESRSEDRATE in the last 72 hours. C-Reactive Protein No results for input(s): CRP in the last 72 hours.  Microbiology: Recent Results (from the past 240 hour(s))  Urine culture     Status: None   Collection Time: 08/23/18  5:36 PM  Result Value Ref Range Status   Specimen Description URINE, RANDOM  Final   Special Requests   Final    NONE Performed at [facility], [address], [place], [place] [address]    Culture NO GROWTH  Final   Report Status 08/24/2018 FINAL  Final  Culture, respiratory (tracheal aspirate)     Status: None (Preliminary result)   Collection Time: 08/23/18  5:36 PM  Result Value Ref Range Status   Specimen Description TRACHEAL ASPIRATE  Final   Special Requests NONE  Final   Gram Stain   Final    FEW WBC PRESENT,BOTH PMN AND MONONUCLEAR  FEW YEAST WITH PSEUDOHYPHAE Performed at [facility], [place] [address] [place], [place] [address]    Culture FEW YEAST  Final   Report Status PENDING  Incomplete  Culture, blood (routine x 2)     Status: None (Preliminary result)   Collection Time: 08/23/18  7:50 PM  Result Value Ref Range Status   Specimen Description BLOOD LEFT ANTECUBITAL  Final   Special Requests   Final    BOTTLES DRAWN AEROBIC AND ANAEROBIC Blood Culture adequate volume   Culture   Final    NO GROWTH 2 DAYS Performed at [facility], [place] [address] [place], [place] [address]    Report Status PENDING  Incomplete  Culture, blood (routine x 2)     Status: None (Preliminary result)   Collection Time: 08/23/18 10:14 PM  Result Value Ref Range Status   Specimen Description BLOOD RIGHT ANTECUBITAL  Final   Special Requests   Final    BOTTLES DRAWN AEROBIC AND ANAEROBIC  Blood Culture adequate volume   Culture   Final    NO GROWTH 2 DAYS Performed at [facility], [place] [address], [place], [place] [address]    Report Status PENDING  Incomplete    Studies/Results: Ct Abdomen Pelvis Wo Contrast  Result Date: 08/23/2018 CLINICAL DATA:  Rectal perforation EXAM: CT ABDOMEN AND PELVIS WITHOUT CONTRAST TECHNIQUE: Multidetector CT imaging of the abdomen and pelvis was performed following the standard protocol without IV contrast. COMPARISON:  08/19/2018 FINDINGS: LOWER CHEST: There is bibasilar consolidation within both lower lobes and the right middle lobe. HEPATOBILIARY: Diffuse hypoattenuation of the liver relative to the spleen suggests hepatic steatosis. No focal liver lesion or biliary dilatation. The gallbladder is normal. PANCREAS: The pancreatic parenchymal contours are normal and there is no ductal dilatation. There is no peripancreatic fluid collection. SPLEEN: Spleen is normal. Small amount of perisplenic ascites. ADRENALS/URINARY TRACT: --Adrenal glands: Normal. --Right kidney/ureter: No hydronephrosis, nephroureterolithiasis, perinephric stranding or solid renal mass. --Left kidney/ureter: No hydronephrosis, nephroureterolithiasis, perinephric stranding or solid renal mass. --Urinary bladder: Normal for degree of distention STOMACH/BOWEL: --Stomach/Duodenum: Enteric tube tip in the stomach. Normal duodenal course. --Small bowel: No dilatation or inflammation. --Colon: Status post partial resection of the rectum. There is a percutaneous drain in the low pelvis via a right lower quadrant approach. There is a small amount of residual gas and fluid in the low pelvis, both anteriorly and posteriorly, but markedly reduced from the prior study. There is a left lower quadrant colostomy. There is fluid throughout the colon. --Appendix: Not visualized. No right lower quadrant inflammation or free fluid. VASCULAR/LYMPHATIC: Normal course and caliber of the major  abdominal vessels. No abdominal or pelvic lymphadenopathy. REPRODUCTIVE: Normal uterus and ovaries. MUSCULOSKELETAL. No bony spinal canal stenosis or focal osseous abnormality. OTHER: Midline anterior abdominal wall surgical defect. IMPRESSION: 1. Status post partial rectal resection, left lower quadrant colostomy, and placement of percutaneous drainage catheter with marked reduction in the amount of fluid, debris and gas in the pelvis. Small amount of residual material in the anterior and posterior pelvis. 2. Bibasilar pulmonary consolidation. 3. Hepatic steatosis. Electronically Signed   By: Ulyses Jarred M.D.   On: 08/23/2018 22:28   Dg Chest 1 View  Result Date: 08/23/2018 CLINICAL DATA:  Endotracheal tube. EXAM: CHEST  1 VIEW COMPARISON:  Radiographs of same day. FINDINGS: Stable cardiomediastinal silhouette. Endotracheal and nasogastric tubes are unchanged in position. Left internal jugular catheter is noted with distal tip in expected position  of right atrium. This is unchanged compared to prior exam. No pneumothorax or significant pleural effusion is noted. Stable diffuse left lung opacity is noted as well as right middle lobe opacity concerning for pneumonia. IMPRESSION: Stable support apparatus.  Stable bilateral lung opacities. Electronically Signed   By: Marijo Conception, M.D.   On: 08/23/2018 18:24   Dg Abd 1 View  Result Date: 08/23/2018 CLINICAL DATA:  Ileus. EXAM: ABDOMEN - 1 VIEW COMPARISON:  Radiograph of same day. FINDINGS: The bowel gas pattern is normal. No radio-opaque calculi or other significant radiographic abnormality are seen. Distal tip of nasogastric tube is seen in proximal stomach. Stable position of pelvic drainage catheter. IMPRESSION: No evidence of bowel obstruction or ileus. Ostomy seen in left lower quadrant. Nasogastric tube tip seen in proximal stomach. Electronically Signed   By: Marijo Conception, M.D.   On: 08/23/2018 18:25   Ct Head Wo Contrast  Result Date:  08/23/2018 CLINICAL DATA:  Altered mental status EXAM: CT HEAD WITHOUT CONTRAST TECHNIQUE: Contiguous axial images were obtained from the base of the skull through the vertex without intravenous contrast. COMPARISON:  Head CT 04/24/2009 FINDINGS: Brain: There is no mass, hemorrhage or extra-axial collection. The size and configuration of the ventricles and extra-axial CSF spaces are normal. The brain parenchyma is normal, without evidence of acute or chronic infarction. Vascular: No abnormal hyperdensity of the major intracranial arteries or dural venous sinuses. No intracranial atherosclerosis. Skull: The visualized skull base, calvarium and extracranial soft tissues are normal. Sinuses/Orbits: No fluid levels or advanced mucosal thickening of the visualized paranasal sinuses. No mastoid or middle ear effusion. The orbits are normal. IMPRESSION: Normal head CT. Electronically Signed   By: Ulyses Jarred M.D.   On: 08/23/2018 22:15   Dg Chest Port 1 View  Result Date: 08/25/2018 CLINICAL DATA:  ARDS. EXAM: PORTABLE CHEST 1 VIEW COMPARISON:  08/24/2018. FINDINGS: Endotracheal tube, left IJ line, NG tube in stable position. Heart size stable. Bilateral atelectasis/infiltrates, left side greater than right again noted without interim change. No pleural effusion or pneumothorax. Heart size stable. Prior cervical spine fusion. IMPRESSION: 1.  Lines and tubes in stable position. 2. Bilateral atelectasis/infiltrates, left side greater than right again noted. No interim change. Electronically Signed   By: Marcello Moores  Register   On: 08/25/2018 05:21   Dg Chest Port 1 View  Result Date: 08/24/2018 CLINICAL DATA:  Verify ETT/central line placement. EXAM: PORTABLE CHEST 1 VIEW COMPARISON:  08/23/2018. FINDINGS: Endotracheal tube, left IJ line, NG tube in stable position. Heart size normal. Diffuse left lung and right base pulmonary infiltrates are again noted. Similar findings noted on prior exam. Bibasilar atelectasis.  No prominent pleural effusions noted. No pneumothorax. Prior cervical spine fusion. IMPRESSION: 1.  Lines and tubes in stable position. 2. Bilateral pulmonary infiltrates and bibasilar atelectasis again noted. Similar findings noted on prior exam. Electronically Signed   By: Artemus   On: 08/24/2018 05:57     Assessment/Plan: Enterococcal bacteremia Perforated colon/rectum ARF Protein calorie malnutrition, severe  Total days of antibiotics: 3 zosyn  Her TTE is negative at [facility], would not repeat. Would not check TEE with clear source of her bacteremia  Await ID of her GNR from [place] (pharm spoke with lab this AM) Cr worsening Her repeat BCx here at [facility] are ngtd Would continue zosyn, appreciate pharm assistance with dosing.           Bobby Rumpf MD, FACP Infectious Diseases (pager) [phone] [email or web address] 08/25/2018,  10:30 AM  LOS: 2 days

## 2018-08-25 NOTE — Progress Notes (Signed)
CSW received consult regarding patient's family contacts since she is listed as a Careers adviser. CSW staffed case with CSW AD. If patient declines and a decision maker is needed, patient's son is only known next of kin and therefore is legally able to make decisions if patient does not have an HCPOA/Advanced Directive on file. If patient is improving and does not need emergent decision making, wait until she is able to communicate her wishes and identify another family member.   Percell Locus Brayson Livesey LCSW [phone]
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
## 2018-08-25 NOTE — Procedures (Signed)
PCCM Video Bronchoscopy Procedure Note  The patient was informed of the risks (including but not limited to bleeding, infection, respiratory failure, lung injury, tooth/oral injury) and benefits of the procedure and gave consent, see chart.  Performed emergently given severe respiratory failure, sudden desaturation  Indication: respiratory distress  Post Procedure Diagnosis: thick mucus plug of distal endotracheal tube  Location: 66M ICU  Condition pre procedure: critically ill on vent  Medications for procedure: fentanyl infusion, versed injection 2mg  IV  Procedure description: The bronchoscope was introduced through the endotracheal tube and passed to the bilateral lungs to the level of the subsegmental bronchi throughout the tracheobronchial tree.  Airway exam revealed copious thick mucus in the distal endotracheal tube which was suctioned with some difficulty.  There was thick mucus plugging the right lower lobe completely occluding the airway, this was suctioned with some difficulty.  The airway and ET tube were patent at the completion of the procedure.  The remainder of the airway was unremarkable.  Procedures performed: therapeutic aspiration of the airway   Specimens sent: none  Condition post procedure: critically ill, on vent  EBL: none  Complications: none  Roselie Awkward, MD [place] PCCM Pager: [phone] Cell: [phone] If no response, call [phone]

## 2018-08-25 NOTE — Progress Notes (Addendum)
[facility] PCCM PROGRESS NOTE  S: 52 year old female who suffered rectal perforation requiring sigmoid colectomy resection and colostomy.  Course complicated by aspiration>ARDS, and bacteremia. [facility].   I was called to bedside at 0015 this morning for a change in her stoma. RN reports that at the beginning of the shift her stoma was circumferentially beefy red, but now has changed as of 2330 and is partially "dar" and "dusky".   O: BP 102/70   Pulse 98   Temp 99 F (37.2 C) (Oral)   Resp 18   Wt 75.1 kg   SpO2 98%   BMI 27.55 kg/m   General:  Middle aged female on vent Neuro:  Sedated, but does occasionally arouse and is agitated.  HEENT:  [place]/AT, PERRL, no JVD Cardiovascular:  RRR, no MRG Lungs:  Coarse bilaterally.  Abdomen:  Soft, non-distended. Longitudinal midline wound vac. Stoma is dusky/purple in color from the 2 o'clock position to the 5 o'clock position. As pictured below. Musculoskeletal:  No acute deformity Skin:  Intact, MMM       A/P:  Rectal perforation s/p colectomy and colostomy:  Stoma has changed over the course of several hours from beefy red throughout, to partially dusky.  - awaiting lactic acid - I have discussed with general surgery, who feels there is nothing to add at this time.   Georgann Housekeeper, AGACNP-BC [facility] Pulmonology/Critical Care Pager [phone] or [phone]

## 2018-08-25 NOTE — Progress Notes (Signed)
[place] Progress Note Patient Name: Julie Williams DOB: 17-Nov-1965 MRN: [id number]   Date of Service  08/25/2018  HPI/Events of Note  Hypokalemia  eICU Interventions  Potassium replaced     Intervention Category Minor Interventions: Electrolytes abnormality - evaluation and management  Karrigan Messamore 08/25/2018, 5:02 AM

## 2018-08-26 ENCOUNTER — Inpatient Hospital Stay (HOSPITAL_COMMUNITY): Payer: PPO

## 2018-08-26 DIAGNOSIS — J690 Pneumonitis due to inhalation of food and vomit: Secondary | ICD-10-CM

## 2018-08-26 DIAGNOSIS — J9601 Acute respiratory failure with hypoxia: Secondary | ICD-10-CM

## 2018-08-26 DIAGNOSIS — E43 Unspecified severe protein-calorie malnutrition: Secondary | ICD-10-CM

## 2018-08-26 HISTORY — DX: Acute respiratory failure with hypoxia: J96.01

## 2018-08-26 HISTORY — DX: Pneumonitis due to inhalation of food and vomit: J69.0

## 2018-08-26 LAB — CBC WITH DIFFERENTIAL/PLATELET
Basophils Absolute: 0 10*3/uL (ref 0.0–0.1)
Basophils Relative: 0 %
Eosinophils Absolute: 0.1 10*3/uL (ref 0.0–0.5)
Eosinophils Relative: 1 %
HCT: 25.9 % — ABNORMAL LOW (ref 36.0–46.0)
HEMOGLOBIN: 8 g/dL — AB (ref 12.0–15.0)
Lymphocytes Relative: 14 %
Lymphs Abs: 1.7 10*3/uL (ref 0.7–4.0)
MCH: 30.5 pg (ref 26.0–34.0)
MCHC: 30.9 g/dL (ref 30.0–36.0)
MCV: 98.9 fL (ref 80.0–100.0)
Monocytes Absolute: 0.4 10*3/uL (ref 0.1–1.0)
Monocytes Relative: 3 %
NEUTROS ABS: 9.8 10*3/uL — AB (ref 1.7–7.7)
Neutrophils Relative %: 82 %
Platelets: 204 10*3/uL (ref 150–400)
RBC: 2.62 MIL/uL — ABNORMAL LOW (ref 3.87–5.11)
RDW: 14.8 % (ref 11.5–15.5)
WBC Morphology: ABNORMAL
WBC: 12 10*3/uL — ABNORMAL HIGH (ref 4.0–10.5)
nRBC: 0.2 % (ref 0.0–0.2)

## 2018-08-26 LAB — BASIC METABOLIC PANEL
Anion gap: 12 (ref 5–15)
BUN: 64 mg/dL — ABNORMAL HIGH (ref 6–20)
CALCIUM: 8 mg/dL — AB (ref 8.9–10.3)
CO2: 21 mmol/L — ABNORMAL LOW (ref 22–32)
Chloride: 107 mmol/L (ref 98–111)
Creatinine, Ser: 4.68 mg/dL — ABNORMAL HIGH (ref 0.44–1.00)
GFR calc Af Amer: 11 mL/min — ABNORMAL LOW (ref >60)
GFR calc non Af Amer: 10 mL/min — ABNORMAL LOW (ref >60)
Glucose, Bld: 92 mg/dL (ref 70–99)
Potassium: 3.7 mmol/L (ref 3.5–5.1)
Sodium: 140 mmol/L (ref 135–145)

## 2018-08-26 LAB — URINALYSIS, ROUTINE W REFLEX MICROSCOPIC
Bacteria, UA: NONE SEEN
Bilirubin Urine: NEGATIVE
Glucose, UA: NEGATIVE mg/dL
Ketones, ur: NEGATIVE mg/dL
Leukocytes, UA: NEGATIVE
Nitrite: NEGATIVE
Protein, ur: 30 mg/dL — AB
Specific Gravity, Urine: 1.011 (ref 1.005–1.030)
pH: 5 (ref 5.0–8.0)

## 2018-08-26 LAB — HEPATITIS PANEL, ACUTE
HCV Ab: <0.1 s/co ratio (ref 0.0–0.9)
Hep A IgM: NEGATIVE
Hep B C IgM: NEGATIVE
Hepatitis B Surface Ag: NEGATIVE

## 2018-08-26 LAB — GLUCOSE, CAPILLARY
GLUCOSE-CAPILLARY: 79 mg/dL (ref 70–99)
Glucose-Capillary: 79 mg/dL (ref 70–99)
Glucose-Capillary: 67 mg/dL — ABNORMAL LOW (ref 70–99)
Glucose-Capillary: 79 mg/dL (ref 70–99)
Glucose-Capillary: 82 mg/dL (ref 70–99)
Glucose-Capillary: 78 mg/dL (ref 70–99)

## 2018-08-26 LAB — PHOSPHORUS: Phosphorus: 3.9 mg/dL (ref 2.5–4.6)

## 2018-08-26 LAB — MAGNESIUM: Magnesium: 2.1 mg/dL (ref 1.7–2.4)

## 2018-08-26 MED ORDER — ETOMIDATE 2 MG/ML IV SOLN
20.0000 mg | Freq: Once | INTRAVENOUS | Status: AC
  Administered 2018-08-26: 20 mg via INTRAVENOUS

## 2018-08-26 MED ORDER — CHLORHEXIDINE GLUCONATE CLOTH 2 % EX PADS
6.0000 | MEDICATED_PAD | Freq: Every day | CUTANEOUS | Status: DC
  Administered 2018-08-27 – 2018-08-29 (×3): 6 via TOPICAL

## 2018-08-26 MED ORDER — SODIUM CHLORIDE 0.9 % IV SOLN
8.0000 mg/kg | INTRAVENOUS | Status: DC
  Filled 2018-08-26: qty 12.37

## 2018-08-26 MED ORDER — MUPIROCIN 2 % EX OINT
1.0000 "application " | TOPICAL_OINTMENT | Freq: Two times a day (BID) | CUTANEOUS | Status: AC
  Administered 2018-08-26 – 2018-08-30 (×10): 1 via NASAL
  Filled 2018-08-26 (×3): qty 22

## 2018-08-26 MED ORDER — SODIUM CHLORIDE 0.9 % IV SOLN
620.0000 mg | INTRAVENOUS | Status: DC
  Administered 2018-08-26: 620 mg via INTRAVENOUS
  Filled 2018-08-26: qty 12.4

## 2018-08-26 MED ORDER — ACETAMINOPHEN 160 MG/5ML PO SOLN
650.0000 mg | Freq: Four times a day (QID) | ORAL | Status: DC | PRN
  Administered 2018-08-26 – 2018-09-02 (×10): 650 mg
  Filled 2018-08-26 (×10): qty 20.3

## 2018-08-26 NOTE — Progress Notes (Signed)
RT NOTES: Patient transported to CT on vent and back to room without incident.

## 2018-08-26 NOTE — Procedures (Signed)
Intubation procedure note.  Patient developed large cuff leak. ETT at 25cm, which is where it had been. Unable to keep cuff inflated. Mrs Moosman was sedated with 42m etomidate. Size 3 mac glidescope was placed. Cuff not visualized above cords. Laryngoscope removed. Tube changed inserted into ETT, ETT removed over tube changer, and new 7.5 ETT inserted over tube changer. Tube changer removed. ETCO2 device with positive color change. Bilateral breath sounds. Complicated by transient hypoxia which improved when new ETT placed.   PGeorgann Housekeeper AGACNP-BC [place] BeachPager [phone] [phone] 08/26/2018 8:14 PM

## 2018-08-26 NOTE — Progress Notes (Signed)
eLink Physician-Brief Progress Note Patient Name: Julie Williams DOB: 06-04-1966 MRN: [id number]   Date of Service  08/26/2018  HPI/Events of Note  Patient has developed cuff leak.   eICU Interventions  Ground team notified to evaluate at bedside.      Intervention Category Major Interventions: Other:  Lysle Dingwall 08/26/2018, 7:42 PM

## 2018-08-26 NOTE — Progress Notes (Addendum)
INFECTIOUS DISEASE PROGRESS NOTE  ID: Julie Williams is a 53 y.o. female with  Active Problems:   ARDS (adult respiratory distress syndrome) (HCC)  Subjective: uncomfortable  Abtx:  Anti-infectives (From admission, onward)   Start     Dose/Rate Route Frequency Ordered Stop   08/25/18 0300  vancomycin (VANCOCIN) IVPB 1000 mg/200 mL premix  Status:  Discontinued     1,000 mg 200 mL/hr over 60 Minutes Intravenous  Once 08/24/18 0122 08/24/18 1049   08/24/18 0100  vancomycin (VANCOCIN) IVPB 1000 mg/200 mL premix  Status:  Discontinued     1,000 mg 200 mL/hr over 60 Minutes Intravenous Every 12 hours 08/24/18 0045 08/24/18 0122   08/24/18 0100  piperacillin-tazobactam (ZOSYN) IVPB 2.25 g     2.25 g 100 mL/hr over 30 Minutes Intravenous Every 6 hours 08/24/18 0045        Medications:  Scheduled: . chlorhexidine gluconate (MEDLINE KIT)  15 mL Mouth Rinse BID  . Chlorhexidine Gluconate Cloth  6 each Topical Q0600  . Chlorhexidine Gluconate Cloth  6 each Topical Q0600  . feeding supplement (VITAL HIGH PROTEIN)  1,000 mL Per Tube Q24H  . heparin  5,000 Units Subcutaneous Q8H  . insulin aspart  0-9 Units Subcutaneous Q6H  . mouth rinse  15 mL Mouth Rinse 10 times per day  . mupirocin ointment  1 application Nasal BID  . pantoprazole sodium  40 mg Per Tube Daily  . sodium chloride flush  10-40 mL Intracatheter Q12H    Objective: Vital signs in last 24 hours: Temp:  [98.2 F (36.8 C)-101 F (38.3 C)] 101 F (38.3 C) (01/01 0723) Pulse Rate:  [81-115] 115 (01/01 0813) Resp:  [12-30] 18 (01/01 0813) BP: (73-152)/(47-99) 115/61 (01/01 0813) SpO2:  [75 %-99 %] 98 % (01/01 0813) FiO2 (%):  [40 %] 40 % (01/01 0813) Weight:  [77.3 kg] 77.3 kg (01/01 0315)   General appearance: alert and moderate distress Resp: rhonchi anterior - bilateral Cardio: tachycardia GI: abnormal findings:  hypoactive bowel sounds and beefy wound, gaurding. serous drainage from wound.  Extremities:  edema anasarca, mild.   Lab Results Recent Labs    08/25/18 0407 08/26/18 0317  WBC 9.7 12.0*  HGB 8.4* 8.0*  HCT 26.5* 25.9*  NA 137 140  K 3.3* 3.7  CL 105 107  CO2 22 21*  BUN 47* 64*  CREATININE 4.36* 4.68*   Liver Panel Recent Labs    08/23/18 1834 08/24/18 0240  PROT 5.3* 5.3*  ALBUMIN 1.6* 1.7*  AST 135* 123*  ALT 89* 81*  ALKPHOS 43 48  BILITOT 1.0 1.2   Sedimentation Rate No results for input(s): ESRSEDRATE in the last 72 hours. C-Reactive Protein No results for input(s): CRP in the last 72 hours.  Microbiology: Recent Results (from the past 240 hour(s))  Urine culture     Status: None   Collection Time: 08/23/18  5:36 PM  Result Value Ref Range Status   Specimen Description URINE, RANDOM  Final   Special Requests   Final    NONE Performed at [facility], [address] [place], [place] [address]    Culture NO GROWTH  Final   Report Status 08/24/2018 FINAL  Final  Culture, respiratory (tracheal aspirate)     Status: None   Collection Time: 08/23/18  5:36 PM  Result Value Ref Range Status   Specimen Description TRACHEAL ASPIRATE  Final   Special Requests NONE  Final   Gram Stain  Final    FEW WBC PRESENT,BOTH PMN AND MONONUCLEAR FEW YEAST WITH PSEUDOHYPHAE Performed at [facility] [address] [place], [place] [address]    Culture FEW CANDIDA ALBICANS  Final   Report Status 08/25/2018 FINAL  Final  Culture, blood (routine x 2)     Status: None (Preliminary result)   Collection Time: 08/23/18  7:50 PM  Result Value Ref Range Status   Specimen Description BLOOD LEFT ANTECUBITAL  Final   Special Requests   Final    BOTTLES DRAWN AEROBIC AND ANAEROBIC Blood Culture adequate volume   Culture   Final    NO GROWTH 2 DAYS Performed at [facility], [place] [address], [place], [place] [address]    Report Status PENDING  Incomplete  Culture, blood (routine x 2)     Status: None (Preliminary result)   Collection Time: 08/23/18  10:14 PM  Result Value Ref Range Status   Specimen Description BLOOD RIGHT ANTECUBITAL  Final   Special Requests   Final    BOTTLES DRAWN AEROBIC AND ANAEROBIC Blood Culture adequate volume   Culture   Final    NO GROWTH 2 DAYS Performed at [facility], [place] [address] [place], [place] [address]    Report Status PENDING  Incomplete    Studies/Results: Dg Chest Port 1 View  Result Date: 08/26/2018 CLINICAL DATA:  Acute respiratory failure. EXAM: PORTABLE CHEST 1 VIEW COMPARISON:  08/25/2018 FINDINGS: Endotracheal tube terminates 3.5 cm above the carina. Enteric tube terminates in the left upper abdomen. Left jugular catheter terminates over the high right atrium. The cardiac silhouette is normal in size. Extensive airspace opacities throughout the left lung and basilar right lung have not significantly changed. There is a small right pleural effusion. No pneumothorax is identified. IMPRESSION: Unchanged left greater than right lung airspace disease. Small right pleural effusion. Electronically Signed   By: Logan Bores M.D.   On: 08/26/2018 08:00   Dg Chest Port 1 View  Result Date: 08/25/2018 CLINICAL DATA:  ARDS. EXAM: PORTABLE CHEST 1 VIEW COMPARISON:  08/24/2018. FINDINGS: Endotracheal tube, left IJ line, NG tube in stable position. Heart size stable. Bilateral atelectasis/infiltrates, left side greater than right again noted without interim change. No pleural effusion or pneumothorax. Heart size stable. Prior cervical spine fusion. IMPRESSION: 1.  Lines and tubes in stable position. 2. Bilateral atelectasis/infiltrates, left side greater than right again noted. No interim change. Electronically Signed   By: Marcello [place]  Register   On: 08/25/2018 05:21     Assessment/Plan: Enterococcal bacteremia Perforated colon/rectum ARF Protein calorie malnutrition, severe  Total days of antibiotics: 4 zosyn  WBC up and having fever this AM Would consider repeat CT of abd.  I spoke with  [facility]- they are preliminarily suspecting Francisella tularensis in her BCx. This would be a very discordant and unusual finding.  CDC rec standard precautions for this.  Will add gent (for ? Of tularemia) and cubicin (for suspected worsening abd and ? Of prior red man syndrome).          Bobby Rumpf MD, FACP Infectious Diseases (pager) [phone] [email or web address] 08/26/2018, 8:45 AM  LOS: 3 days    Addendum Spoke with with [facility] lab- they have ruled out tularemia.  Will stop gent.  RN advised.  [facility] advised IP re-notified

## 2018-08-26 NOTE — Progress Notes (Signed)
Pharmacy Antibiotic Note  Julie Williams is a 53 y.o. female admitted on 08/23/2018 s/p rectal perf and resection of distal sigmoid colon and proximal rectum with diverting colostomy. Now has enterococcal bacteremia and pneumonia. Also in ARF with still increasing SCr up to 4.6, from baseline 0.8.   Plan: -Daptomycin 8 mg/kg IV q48h -Zosyn 2.25 g IV q6h -Monitor renal fx, cultures, CK qweek   Weight: 170 lb 6.7 oz (77.3 kg)  Temp (24hrs), Avg:99.1 F (37.3 C), Min:98.2 F (36.8 C), Max:101 F (38.3 C)  Recent Labs  Lab 08/23/18 1834 08/23/18 2214 08/24/18 0240 08/24/18 1130 08/25/18 0013 08/25/18 0407 08/26/18 0317  WBC 7.2  --  10.2 8.6  --  9.7 12.0*  CREATININE 3.40*  --  3.61*  --   --  4.36* 4.68*  LATICACIDVEN  --  2.9* 1.8  --  1.3  --   --      Antimicrobials this admission: 12/30 zosyn > 1/1 daptomycin >  Dose adjustments this admission: NA  Microbiology results: 12/29 blood cx: ngtd 12/29 urine cx: neg 12/29 resp cx: yeast  [place] cultures: 12/25 blood cx 4/4: enterococcus faecalis +/- GNR Sensitive to vanc, pen, amp  Harvel Quale 08/26/2018 9:30 AM
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
## 2018-08-27 ENCOUNTER — Inpatient Hospital Stay (HOSPITAL_COMMUNITY): Payer: PPO

## 2018-08-27 ENCOUNTER — Inpatient Hospital Stay: Payer: Self-pay

## 2018-08-27 DIAGNOSIS — K631 Perforation of intestine (nontraumatic): Secondary | ICD-10-CM | POA: Diagnosis present

## 2018-08-27 DIAGNOSIS — R7881 Bacteremia: Secondary | ICD-10-CM

## 2018-08-27 DIAGNOSIS — G894 Chronic pain syndrome: Secondary | ICD-10-CM

## 2018-08-27 DIAGNOSIS — Z933 Colostomy status: Secondary | ICD-10-CM

## 2018-08-27 DIAGNOSIS — B952 Enterococcus as the cause of diseases classified elsewhere: Secondary | ICD-10-CM

## 2018-08-27 DIAGNOSIS — K5903 Drug induced constipation: Secondary | ICD-10-CM

## 2018-08-27 DIAGNOSIS — A499 Bacterial infection, unspecified: Secondary | ICD-10-CM | POA: Diagnosis present

## 2018-08-27 DIAGNOSIS — K626 Ulcer of anus and rectum: Secondary | ICD-10-CM

## 2018-08-27 DIAGNOSIS — Z9911 Dependence on respirator [ventilator] status: Secondary | ICD-10-CM

## 2018-08-27 DIAGNOSIS — Z978 Presence of other specified devices: Secondary | ICD-10-CM

## 2018-08-27 DIAGNOSIS — Z79891 Long term (current) use of opiate analgesic: Secondary | ICD-10-CM

## 2018-08-27 DIAGNOSIS — T402X5S Adverse effect of other opioids, sequela: Secondary | ICD-10-CM

## 2018-08-27 DIAGNOSIS — K659 Peritonitis, unspecified: Secondary | ICD-10-CM | POA: Diagnosis present

## 2018-08-27 DIAGNOSIS — Z91030 Bee allergy status: Secondary | ICD-10-CM

## 2018-08-27 HISTORY — DX: Ulcer of anus and rectum: K62.6

## 2018-08-27 LAB — COMPREHENSIVE METABOLIC PANEL
ALT: 31 U/L (ref 0–44)
AST: 38 U/L (ref 15–41)
Albumin: 1.3 g/dL — ABNORMAL LOW (ref 3.5–5.0)
Alkaline Phosphatase: 54 U/L (ref 38–126)
Anion gap: 13 (ref 5–15)
BUN: 70 mg/dL — ABNORMAL HIGH (ref 6–20)
CO2: 20 mmol/L — ABNORMAL LOW (ref 22–32)
Calcium: 7.8 mg/dL — ABNORMAL LOW (ref 8.9–10.3)
Chloride: 106 mmol/L (ref 98–111)
Creatinine, Ser: 4.64 mg/dL — ABNORMAL HIGH (ref 0.44–1.00)
GFR calc Af Amer: 12 mL/min — ABNORMAL LOW (ref >60)
GFR calc non Af Amer: 10 mL/min — ABNORMAL LOW (ref >60)
Glucose, Bld: 90 mg/dL (ref 70–99)
Potassium: 3.5 mmol/L (ref 3.5–5.1)
Sodium: 139 mmol/L (ref 135–145)
Total Bilirubin: 0.6 mg/dL (ref 0.3–1.2)
Total Protein: 5.7 g/dL — ABNORMAL LOW (ref 6.5–8.1)

## 2018-08-27 LAB — PHOSPHORUS: Phosphorus: 4.5 mg/dL (ref 2.5–4.6)

## 2018-08-27 LAB — CK: Total CK: 52 U/L (ref 38–234)

## 2018-08-27 LAB — CBC
HCT: 27.3 % — ABNORMAL LOW (ref 36.0–46.0)
Hemoglobin: 8.8 g/dL — ABNORMAL LOW (ref 12.0–15.0)
MCH: 31.8 pg (ref 26.0–34.0)
MCHC: 32.2 g/dL (ref 30.0–36.0)
MCV: 98.6 fL (ref 80.0–100.0)
NRBC: 0.1 % (ref 0.0–0.2)
Platelets: 401 10*3/uL — ABNORMAL HIGH (ref 150–400)
RBC: 2.77 MIL/uL — ABNORMAL LOW (ref 3.87–5.11)
RDW: 14.8 % (ref 11.5–15.5)
WBC: 16.8 10*3/uL — ABNORMAL HIGH (ref 4.0–10.5)

## 2018-08-27 LAB — GLUCOSE, CAPILLARY
GLUCOSE-CAPILLARY: 77 mg/dL (ref 70–99)
Glucose-Capillary: 71 mg/dL (ref 70–99)
Glucose-Capillary: 71 mg/dL (ref 70–99)
Glucose-Capillary: 84 mg/dL (ref 70–99)
Glucose-Capillary: 83 mg/dL (ref 70–99)

## 2018-08-27 LAB — MAGNESIUM: Magnesium: 2 mg/dL (ref 1.7–2.4)

## 2018-08-27 MED ORDER — FUROSEMIDE 10 MG/ML IJ SOLN
120.0000 mg | Freq: Once | INTRAVENOUS | Status: AC
  Administered 2018-08-27: 120 mg via INTRAVENOUS
  Filled 2018-08-27: qty 10

## 2018-08-27 MED ORDER — AMIODARONE HCL IN DEXTROSE 360-4.14 MG/200ML-% IV SOLN
30.0000 mg/h | INTRAVENOUS | Status: DC
  Filled 2018-08-27: qty 200

## 2018-08-27 MED ORDER — DILTIAZEM HCL-DEXTROSE 100-5 MG/100ML-% IV SOLN (PREMIX): 5.0000 mg/h | INTRAVENOUS | Status: DC

## 2018-08-27 MED ORDER — AMIODARONE HCL IN DEXTROSE 360-4.14 MG/200ML-% IV SOLN
60.0000 mg/h | INTRAVENOUS | Status: AC
  Administered 2018-08-27 (×2): 60 mg/h via INTRAVENOUS

## 2018-08-27 MED ORDER — AMIODARONE HCL IN DEXTROSE 360-4.14 MG/200ML-% IV SOLN
30.0000 mg/h | INTRAVENOUS | Status: DC
  Administered 2018-08-27 – 2018-08-28 (×2): 30 mg/h via INTRAVENOUS
  Filled 2018-08-27 (×2): qty 200

## 2018-08-27 MED ORDER — AMIODARONE LOAD VIA INFUSION
150.0000 mg | Freq: Once | INTRAVENOUS | Status: AC
  Administered 2018-08-27: 150 mg via INTRAVENOUS
  Filled 2018-08-27: qty 83.34

## 2018-08-27 MED ORDER — METOPROLOL TARTRATE 5 MG/5ML IV SOLN
5.0000 mg | Freq: Once | INTRAVENOUS | Status: AC
  Administered 2018-08-27: 5 mg via INTRAVENOUS
  Filled 2018-08-27: qty 5
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
## 2018-08-27 NOTE — Progress Notes (Addendum)
CSW acknowledges consult in assisting staff in locating family for pt. CSW is aware that through handoff report, pt has a son but while at [facility] asked that her son NOT be contacted.  Per previous note of LCSW, it states  "CSW received consult regarding patient's family contacts since she is listed as a XXX. CSW staffed case with CSW AD. If patient declines and a decision maker is needed, patient's son is only known next of kin and therefore is legally able to make decisions if patient does not have an HCPOA/Advanced Directive on file. If patient is improving and does not need emergent decision making, wait until she is able to communicate her wishes and identify another family member".  CSW spoke with staff on unit to give this update and CSW was informed by RN that on yesterday pt appeared to be mouthing " I want my son now". CSW is actively reviewing pt's chart to see if CSW can locate a number for pt's son -however no information found at this time.     Julie Williams, MSW, [facility] Clinical Social Worker [phone]

## 2018-08-27 NOTE — Progress Notes (Signed)
CC: Acute respiratory failure after Hartman's procedure  Subjective: Pt sedated on the Vent, but wakes up some during exam.  She has tube feedings started via OG, at 20 ml/hr.  She has stool in the ostomy bag, mostly liquid.  The JP is draining clear serous fluid.  Midline incision with wound vac in place.    Objective: Vital signs in last 24 hours: Temp:  [98.4 F (36.9 C)-102.2 F (39 C)] 102.2 F (39 C) (01/02 0721) Pulse Rate:  [79-117] 117 (01/02 0759) Resp:  [14-23] 21 (01/02 0800) BP: (83-161)/(56-104) 119/72 (01/02 0800) SpO2:  [94 %-99 %] 99 % (01/02 0800) FiO2 (%):  [40 %] 40 % (01/02 0800) Weight:  [81.5 kg] 81.5 kg (01/02 0408) Last BM Date: 08/26/18 3807 IV 970 urine 85 drain 550 from NG T-max 102.2 -0 700 this a.m.; 101 @0700  yesterday Potassium 3.5 Creatinine 4.64 Acute hepatitis panel was negative. HIV  - negative CXR this a.m.: Bilateral airspace opacification left greater than right concerning for multifocal pneumonia small bilateral pleural effusions CT of the abdomen pelvis 1/1: Full amount of intra-abdominal pelvic ascites, mild thickening involving the cecum and proximal ascending colon, possible colitis.  Bilateral lower lobe, right middle lobe and lingula pneumonia associated with small bilateral pleural effusions.  Intake/Output from previous day: 01/01 0701 - 01/02 0700 In: 3807.2 [I.V.:2045.3; NG/GT:1450; IV Piggyback:311.9] Out: 7654 [Urine:970; Drains:85; Stool:550] Intake/Output this shift: No intake/output data recorded.  General appearance: sedated but wakes up.   Resp: clear to auscultation bilaterally and anterior exam, full Vent support GI: soft, midline wound vac, JP with serous fluid, Ostomy is working, mostly liquid stool, Ostomy looks OK, some edema and an area that is ? white in color.    Lab Results:  Recent Labs    08/25/18 0407 08/26/18 0317  WBC 9.7 12.0*  HGB 8.4* 8.0*  HCT 26.5* 25.9*  PLT 140* 204     BMET Recent Labs    08/26/18 0317 08/27/18 0415  NA 140 139  K 3.7 3.5  CL 107 106  CO2 21* 20*  GLUCOSE 92 90  BUN 64* 70*  CREATININE 4.68* 4.64*  CALCIUM 8.0* 7.8*   PT/INR No results for input(s): LABPROT, INR in the last 72 hours.  Recent Labs  Lab 08/23/18 1834 08/24/18 0240 08/27/18 0415  AST 135* 123* 38  ALT 89* 81* 31  ALKPHOS 43 48 54  BILITOT 1.0 1.2 0.6  PROT 5.3* 5.3* 5.7*  ALBUMIN 1.6* 1.7* 1.3*     Lipase     Component Value Date/Time   LIPASE 87 (H) 08/23/2018 1834   Prior to Admission medications   Medication Sig Start Date End Date Taking? Authorizing Provider  albuterol (PROVENTIL HFA;VENTOLIN HFA) 108 (90 Base) MCG/ACT inhaler Inhale 1-2 puffs into the lungs every 6 (six) hours as needed for wheezing or shortness of breath. 04/07/18   Kalman Shan Ratliff, DO  aspirin EC 81 MG tablet Take 81 mg by mouth daily.    [provider]  diphenhydrAMINE (BENADRYL) 25 MG tablet Take 25 mg by mouth every 6 (six) hours as needed for itching, allergies or sleep.    [provider]  EPINEPHrine 0.3 mg/0.3 mL IJ SOAJ injection Inject 0.3 mLs (0.3 mg total) into the muscle once. 11/22/15   Dellia Nims, MD  ibuprofen (ADVIL,MOTRIN) 200 MG tablet Take 400 mg by mouth every 6 (six) hours as needed for headache or mild pain.    [provider]  morphine (  MSIR) 30 MG tablet Take 1/2 to 1 tab every 4-6 hours for pain as needed. Patient taking differently: Take 15-30 mg by mouth every 4 (four) hours as needed for severe pain.  06/26/18 09/18/18  Kalman Shan Ratliff, DO  naproxen sodium (ALEVE) 220 MG tablet Take 220 mg by mouth 2 (two) times daily as needed (pain).    [provider]  omeprazole (PRILOSEC) 40 MG capsule Take 1 capsule (40 mg total) by mouth daily. 08/14/18   Hoffman, Janett Billow Ratliff, DO  polyethylene glycol powder (GLYCOLAX/MIRALAX) powder DISSOLVE 255 GRAMS INTO LIQUID AND TAKE BY MOUTH ONCE. 09/18/17    Valinda Party, DO  varenicline (CHANTIX) 0.5 MG tablet Take 1 tablet (0.5 mg total) by mouth daily. Days 1-3 (0.5 mg daily)  Days 4-7 (0.5 mg twice daily) Day 8 and later (1 mg twice daily) 06/26/18   Hoffman, Elza Rafter, DO  varenicline (CHANTIX) 1 MG tablet Take 1 tablet (1 mg total) by mouth 2 (two) times daily. Day 1-3 0.5 mg daily Day 4-7 0.5 mg twice daily Day 8 and later 1 mg twice daily 06/26/18 03/23/19  Kalman Shan Ratliff, DO  venlafaxine XR (EFFEXOR-XR) 37.5 MG 24 hr capsule Take 1 capsule (37.5 mg total) by mouth daily with breakfast. 08/14/18   Valinda Party, DO      Medications: . chlorhexidine gluconate (MEDLINE KIT)  15 mL Mouth Rinse BID  . Chlorhexidine Gluconate Cloth  6 each Topical Q0600  . Chlorhexidine Gluconate Cloth  6 each Topical Q0600  . feeding supplement (VITAL HIGH PROTEIN)  1,000 mL Per Tube Q24H  . heparin  5,000 Units Subcutaneous Q8H  . insulin aspart  0-9 Units Subcutaneous Q6H  . mouth rinse  15 mL Mouth Rinse 10 times per day  . mupirocin ointment  1 application Nasal BID  . pantoprazole sodium  40 mg Per Tube Daily  . sodium chloride flush  10-40 mL Intracatheter Q12H   . sodium chloride 10 mL/hr at 08/26/18 1900  . DAPTOmycin (CUBICIN)  IV Stopped (08/26/18 1154)  . fentaNYL infusion INTRAVENOUS 400 mcg/hr (08/27/18 0700)  . lactated ringers 50 mL/hr at 08/27/18 0700  . piperacillin-tazobactam (ZOSYN)  IV Stopped (08/27/18 0640)   Anti-infectives (From admission, onward)   Start     Dose/Rate Route Frequency Ordered Stop   08/26/18 1030  DAPTOmycin (CUBICIN) 618.5 mg in sodium chloride 0.9 % IVPB  Status:  Discontinued     8 mg/kg  77.3 kg 224.7 mL/hr over 30 Minutes Intravenous Every 48 hours 08/26/18 0934 08/26/18 0935   08/26/18 1030  DAPTOmycin (CUBICIN) 620 mg in sodium chloride 0.9 % IVPB     620 mg 224.8 mL/hr over 30 Minutes Intravenous Every 48 hours 08/26/18 0935     08/25/18 0300  vancomycin (VANCOCIN)  IVPB 1000 mg/200 mL premix  Status:  Discontinued     1,000 mg 200 mL/hr over 60 Minutes Intravenous  Once 08/24/18 0122 08/24/18 1049   08/24/18 0100  vancomycin (VANCOCIN) IVPB 1000 mg/200 mL premix  Status:  Discontinued     1,000 mg 200 mL/hr over 60 Minutes Intravenous Every 12 hours 08/24/18 0045 08/24/18 0122   08/24/18 0100  piperacillin-tazobactam (ZOSYN) IVPB 2.25 g     2.25 g 100 mL/hr over 30 Minutes Intravenous Every 6 hours 08/24/18 0045       Assessment/Plan Enterococcal bacteremia -ID following HTN GERD Depression Alcohol abuse Chronic pain/narcotic dependence - On Morphine at home Former smoker VDRF/ARDS - aspiration pneumonia -  CCM  Acute renal failure -creatinine 4.64 Anemia -H/H: 8.0/25.9   Perforated colon/rectum S/p exploratory laparotomy, resection distal sigmoid colon and proximal rectum, diverting colostomy, wound vac placement 12/25 Dr. Noberto Retort - POD8 - surgical path?  - wound vac changes MWF - continue JP drain and monitor output - no output from colostomy yet  ID -vancomycin 12/30>>12/30;  zosyn 12/30>> day 4,  daptomycin 1/1>>  Day 2 VTE - SCDs, sq heparin FEN - IVF, NPO/OGT, TPN Foley - in place  Plan: Rx per CCM, we will look at the wound vac and the ostomy tomorrow with dressing change.        LOS: 4 days    Julie Williams 08/27/2018 [phone]

## 2018-08-27 NOTE — Progress Notes (Signed)
Patient ID: Julie Williams, female   DOB: Nov 29, 1965, 53 y.o.   MRN: [id number]         [place] for Infectious Disease  Date of Admission:  08/23/2018   Total days of antibiotics 9        Day 5 piperacillin tazobactam        Day 2 daptomycin  ASSESSMENT: She has chronic pain syndrome and is on chronic narcotics causing severe constipation and a stercoral perforation of the rectum.  She was admitted to [facility] on 08/18/2018 where a CT scan revealed a perforation and stool in the pelvis.  Blood cultures there have grown a sensitive enterococcus and (probable) Fusobacterium.  Repeat blood cultures here have been negative.  She may also have some basilar pneumonia.  Continue piperacillin tazobactam for peritonitis and polymicrobial bacteremia.  MRSA screen at [facility] was positive but I do not feel that she needs the MRSA coverage of daptomycin and will stop it now.  PLAN: 1. Continue piperacillin tazobactam 2. Discontinue daptomycin  Principal Problem:   Polymicrobial bacterial infection Active Problems:   Stercoral ulcer of rectum   Rectal perforation (HCC)   Peritonitis (HCC)   ARDS (adult respiratory distress syndrome) (HCC)   Acute respiratory failure with hypoxemia (HCC)   Scheduled Meds: . chlorhexidine gluconate (MEDLINE KIT)  15 mL Mouth Rinse BID  . Chlorhexidine Gluconate Cloth  6 each Topical Q0600  . Chlorhexidine Gluconate Cloth  6 each Topical Q0600  . feeding supplement (VITAL HIGH PROTEIN)  1,000 mL Per Tube Q24H  . heparin  5,000 Units Subcutaneous Q8H  . insulin aspart  0-9 Units Subcutaneous Q6H  . mouth rinse  15 mL Mouth Rinse 10 times per day  . mupirocin ointment  1 application Nasal BID  . pantoprazole sodium  40 mg Per Tube Daily  . sodium chloride flush  10-40 mL Intracatheter Q12H   Continuous Infusions: . sodium chloride 500 mL (08/27/18 0955)  . DAPTOmycin (CUBICIN)  IV Stopped (08/26/18 1154)  . fentaNYL infusion  INTRAVENOUS 200 mcg/hr (08/27/18 0949)  . lactated ringers 50 mL/hr at 08/27/18 0951  . piperacillin-tazobactam (ZOSYN)  IV Stopped (08/27/18 0640)   PRN Meds:.sodium chloride, acetaminophen (TYLENOL) oral liquid 160 mg/5 mL, midazolam, sodium chloride flush   Review of Systems: Review of Systems  Unable to perform ROS: Intubated    Allergies  Allergen Reactions  . Bee Venom Swelling    OBJECTIVE: Vitals:   08/27/18 0700 08/27/18 0721 08/27/18 0759 08/27/18 0800  BP: 132/75  135/78 119/72  Pulse:   (!) 117   Resp: (!) 21  (!) 21 (!) 21  Temp:  (!) 102.2 F (39 C)    TempSrc:  Axillary    SpO2: 97%  99% 99%  Weight:       Body mass index is 29.9 kg/m.  Physical Exam Constitutional:      Comments: She is intubated, alert and mildly agitated.  Cardiovascular:     Rate and Rhythm: Normal rate and regular rhythm.     Heart sounds: No murmur.  Pulmonary:     Breath sounds: Rhonchi present.  Abdominal:     General: There is distension.     Palpations: Abdomen is soft.     Tenderness: There is abdominal tenderness.     Comments: She has a left-sided colostomy.  There is a VAC dressing over her incision and a right lower drain in place.     Lab Results Lab Results  Component Value Date   WBC 12.0 (H) 08/26/2018   HGB 8.0 (L) 08/26/2018   HCT 25.9 (L) 08/26/2018   MCV 98.9 08/26/2018   PLT 204 08/26/2018    Lab Results  Component Value Date   CREATININE 4.64 (H) 08/27/2018   BUN 70 (H) 08/27/2018   NA 139 08/27/2018   K 3.5 08/27/2018   CL 106 08/27/2018   CO2 20 (L) 08/27/2018    Lab Results  Component Value Date   ALT 31 08/27/2018   AST 38 08/27/2018   ALKPHOS 54 08/27/2018   BILITOT 0.6 08/27/2018     Microbiology: Recent Results (from the past 240 hour(s))  Urine culture     Status: None   Collection Time: 08/23/18  5:36 PM  Result Value Ref Range Status   Specimen Description URINE, RANDOM  Final   Special Requests   Final     NONE Performed at [facility], [place] [address] [place], [place] [address]    Culture NO GROWTH  Final   Report Status 08/24/2018 FINAL  Final  Culture, respiratory (tracheal aspirate)     Status: None   Collection Time: 08/23/18  5:36 PM  Result Value Ref Range Status   Specimen Description TRACHEAL ASPIRATE  Final   Special Requests NONE  Final   Gram Stain   Final    FEW WBC PRESENT,BOTH PMN AND MONONUCLEAR FEW YEAST WITH PSEUDOHYPHAE Performed at [facility], [place] [address] [place], [place] [address]    Culture FEW CANDIDA ALBICANS  Final   Report Status 08/25/2018 FINAL  Final  Culture, blood (routine x 2)     Status: None (Preliminary result)   Collection Time: 08/23/18  7:50 PM  Result Value Ref Range Status   Specimen Description BLOOD LEFT ANTECUBITAL  Final   Special Requests   Final    BOTTLES DRAWN AEROBIC AND ANAEROBIC Blood Culture adequate volume   Culture   Final    NO GROWTH 3 DAYS Performed at [facility], [place] [address], [place], [place] [address]    Report Status PENDING  Incomplete  Culture, blood (routine x 2)     Status: None (Preliminary result)   Collection Time: 08/23/18 10:14 PM  Result Value Ref Range Status   Specimen Description BLOOD RIGHT ANTECUBITAL  Final   Special Requests   Final    BOTTLES DRAWN AEROBIC AND ANAEROBIC Blood Culture adequate volume   Culture   Final    NO GROWTH 3 DAYS Performed at [facility], [place] [address] [place], [place] [address]    Report Status PENDING  Incomplete  Culture, respiratory (non-expectorated)     Status: None (Preliminary result)   Collection Time: 08/26/18  1:33 PM  Result Value Ref Range Status   Specimen Description TRACHEAL ASPIRATE  Final   Special Requests NONE  Final   Gram Stain RARE WBC PRESENT, PREDOMINANTLY PMN RARE YEAST   Final   Culture   Final    CULTURE REINCUBATED FOR BETTER GROWTH Performed at [facility], [place] [address], [place],   [address]    Report Status PENDING  Incomplete    Michel Bickers, MD [facility] [phone] pager   [phone] cell 08/27/2018, 10:33 AM

## 2018-08-27 NOTE — Progress Notes (Signed)
Attending:    Subjective: Fevers> CT abdomen without new abscess, some pneumonia noted ETT exchanged yesterday Afib off and on overnight, given metoprolol  Objective: Vitals:   08/27/18 0759 08/27/18 0800 08/27/18 0900 08/27/18 1000  BP: 135/78 119/72 140/80 113/75  Pulse: (!) 117     Resp: (!) 21 (!) 21 20 19   Temp:      TempSrc:      SpO2: 99% 99% 96% 96%  Weight:       Vent Mode: PRVC FiO2 (%):  [40 %] 40 % Set Rate:  [18 bmp] 18 bmp Vt Set:  [450 mL] 450 mL PEEP:  [5 cmH20-8 cmH20] 5 cmH20 Pressure Support:  [8 cmH20] 8 cmH20 Plateau Pressure:  [18 cmH20-23 cmH20] 23 cmH20  Intake/Output Summary (Last 24 hours) at 08/27/2018 1053 Last data filed at 08/27/2018 1000 Gross per 24 hour  Intake 3793.31 ml  Output 1400 ml  Net 2393.31 ml    General:  In bed on vent HENT: NCAT ETT in place PULM: Rhonchi bilaterally, vent supported breathing CV: RRR, no mgr GI: midline wound vac OK, ostomy OK, some breath sounds MSK: normal bulk and tone Derm: edema noted in arms/legs bilaterally Neuro: sedated on vent     CBC    Component Value Date/Time   WBC 16.8 (H) 08/27/2018 1003   RBC 2.77 (L) 08/27/2018 1003   HGB 8.8 (L) 08/27/2018 1003   HCT 27.3 (L) 08/27/2018 1003   PLT 401 (H) 08/27/2018 1003   MCV 98.6 08/27/2018 1003   MCH 31.8 08/27/2018 1003   MCHC 32.2 08/27/2018 1003   RDW 14.8 08/27/2018 1003   LYMPHSABS 1.7 08/26/2018 0317   MONOABS 0.4 08/26/2018 0317   EOSABS 0.1 08/26/2018 0317   BASOSABS 0.0 08/26/2018 0317    BMET    Component Value Date/Time   NA 139 08/27/2018 0415   K 3.5 08/27/2018 0415   CL 106 08/27/2018 0415   CO2 20 (L) 08/27/2018 0415   GLUCOSE 90 08/27/2018 0415   BUN 70 (H) 08/27/2018 0415   CREATININE 4.64 (H) 08/27/2018 0415   CREATININE 0.72 04/28/2014 1201   CALCIUM 7.8 (L) 08/27/2018 0415   GFRNONAA 10 (L) 08/27/2018 0415   GFRNONAA >89 04/28/2014 1201   GFRAA 12 (L) 08/27/2018 0415   GFRAA >89 04/28/2014 1201       Impression/Plan:  Ongoing fevers: multiple possible sources including enterococcal, follow cultures, give central line holiday, antibitoics per ID Acute respiratory failure with hypoxemia: continue SBT daily, pulmonary toilette Sinus tachycardia: rate up to 180, given metoprolol today, sinus vs afib, check EKG today AKI, non-oliguric, continue to monitor BMET, give lasix today as she is volume overloaded, kvo fluids, may need renal if no improvement  My cc time 31 minutes  Roselie Awkward, MD [place] [facility] Pager: [phone] Cell: [phone] After 3pm or if no response, call [phone]

## 2018-08-28 DIAGNOSIS — T8149XA Infection following a procedure, other surgical site, initial encounter: Secondary | ICD-10-CM

## 2018-08-28 DIAGNOSIS — K658 Other peritonitis: Secondary | ICD-10-CM

## 2018-08-28 LAB — BASIC METABOLIC PANEL
Anion gap: 14 (ref 5–15)
BUN: 70 mg/dL — ABNORMAL HIGH (ref 6–20)
CHLORIDE: 105 mmol/L (ref 98–111)
CO2: 22 mmol/L (ref 22–32)
Calcium: 7.5 mg/dL — ABNORMAL LOW (ref 8.9–10.3)
Creatinine, Ser: 5.47 mg/dL — ABNORMAL HIGH (ref 0.44–1.00)
GFR calc non Af Amer: 8 mL/min — ABNORMAL LOW (ref >60)
GFR, EST AFRICAN AMERICAN: 9 mL/min — AB (ref >60)
GLUCOSE: 110 mg/dL — AB (ref 70–99)
Potassium: 3.2 mmol/L — ABNORMAL LOW (ref 3.5–5.1)
Sodium: 141 mmol/L (ref 135–145)

## 2018-08-28 LAB — GLUCOSE, CAPILLARY
Glucose-Capillary: 100 mg/dL — ABNORMAL HIGH (ref 70–99)
Glucose-Capillary: 82 mg/dL (ref 70–99)
Glucose-Capillary: 85 mg/dL (ref 70–99)
Glucose-Capillary: 91 mg/dL (ref 70–99)
Glucose-Capillary: 93 mg/dL (ref 70–99)
Glucose-Capillary: 94 mg/dL (ref 70–99)
Glucose-Capillary: 97 mg/dL (ref 70–99)

## 2018-08-28 LAB — CULTURE, BLOOD (ROUTINE X 2)
CULTURE: NO GROWTH
Culture: NO GROWTH
Special Requests: ADEQUATE
Special Requests: ADEQUATE

## 2018-08-28 LAB — CBC
HCT: 24.2 % — ABNORMAL LOW (ref 36.0–46.0)
Hemoglobin: 8 g/dL — ABNORMAL LOW (ref 12.0–15.0)
MCH: 31.9 pg (ref 26.0–34.0)
MCHC: 33.1 g/dL (ref 30.0–36.0)
MCV: 96.4 fL (ref 80.0–100.0)
NRBC: 0.1 % (ref 0.0–0.2)
Platelets: 403 10*3/uL — ABNORMAL HIGH (ref 150–400)
RBC: 2.51 MIL/uL — ABNORMAL LOW (ref 3.87–5.11)
RDW: 14.8 % (ref 11.5–15.5)
WBC: 15 10*3/uL — AB (ref 4.0–10.5)

## 2018-08-28 LAB — MRSA PCR SCREENING: MRSA by PCR: POSITIVE — AB

## 2018-08-28 MED ORDER — POTASSIUM CHLORIDE CRYS ER 20 MEQ PO TBCR: 40.0000 meq | EXTENDED_RELEASE_TABLET | Freq: Once | ORAL | Status: DC

## 2018-08-28 MED ORDER — SODIUM CHLORIDE 0.9% FLUSH: 10.0000 mL | INTRAVENOUS | Status: DC | PRN

## 2018-08-28 MED ORDER — DILTIAZEM HCL-DEXTROSE 100-5 MG/100ML-% IV SOLN (PREMIX)
5.0000 mg/h | INTRAVENOUS | Status: DC
  Filled 2018-08-28: qty 100

## 2018-08-28 MED ORDER — METOPROLOL TARTRATE 25 MG PO TABS
25.0000 mg | ORAL_TABLET | Freq: Two times a day (BID) | ORAL | Status: DC
  Administered 2018-08-28 – 2018-09-02 (×11): 25 mg via ORAL
  Filled 2018-08-28 (×7): qty 1
  Filled 2018-08-28: qty 2
  Filled 2018-08-28 (×4): qty 1

## 2018-08-28 MED ORDER — DILTIAZEM HCL 25 MG/5ML IV SOLN
10.0000 mg | Freq: Once | INTRAVENOUS | Status: AC
  Administered 2018-08-28: 10 mg via INTRAVENOUS
  Filled 2018-08-28: qty 5

## 2018-08-28 MED ORDER — VITAL AF 1.2 CAL PO LIQD
1000.0000 mL | ORAL | Status: DC
  Administered 2018-08-28 – 2018-09-01 (×5): 1000 mL

## 2018-08-28 MED ORDER — POTASSIUM CHLORIDE 10 MEQ/100ML IV SOLN
10.0000 meq | INTRAVENOUS | Status: AC
  Administered 2018-08-28 (×2): 10 meq via INTRAVENOUS
  Filled 2018-08-28 (×2): qty 100

## 2018-08-28 MED ORDER — FENTANYL CITRATE (PF) 100 MCG/2ML IJ SOLN
100.0000 ug | INTRAMUSCULAR | Status: DC | PRN
  Administered 2018-08-28 – 2018-09-03 (×20): 100 ug via INTRAVENOUS
  Filled 2018-08-28 (×2): qty 2

## 2018-08-28 MED ORDER — SODIUM CHLORIDE 0.9% FLUSH
10.0000 mL | Freq: Two times a day (BID) | INTRAVENOUS | Status: DC
  Administered 2018-08-28 – 2018-08-29 (×3): 10 mL
  Administered 2018-08-29: 20 mL
  Administered 2018-08-30: 10 mL

## 2018-08-28 MED ORDER — SODIUM CHLORIDE 0.9 % IV SOLN
1.0000 g | Freq: Two times a day (BID) | INTRAVENOUS | Status: DC
  Administered 2018-08-28 – 2018-09-03 (×13): 1 g via INTRAVENOUS
  Filled 2018-08-28 (×14): qty 1

## 2018-08-28 MED ORDER — AMIODARONE HCL 200 MG PO TABS
200.0000 mg | ORAL_TABLET | Freq: Two times a day (BID) | ORAL | Status: DC
  Administered 2018-08-28 – 2018-09-04 (×13): 200 mg via ORAL
  Filled 2018-08-28 (×16): qty 1

## 2018-08-28 MED ORDER — CHLORHEXIDINE GLUCONATE CLOTH 2 % EX PADS
6.0000 | MEDICATED_PAD | Freq: Every day | CUTANEOUS | Status: DC
  Administered 2018-08-28: 6 via TOPICAL

## 2018-08-28 MED ORDER — SODIUM CHLORIDE 0.9 % IV SOLN
640.0000 mg | INTRAVENOUS | Status: DC
  Administered 2018-08-28: 640 mg via INTRAVENOUS
  Filled 2018-08-28: qty 12.8

## 2018-08-28 MED ORDER — FENTANYL 75 MCG/HR TD PT72
75.0000 ug | MEDICATED_PATCH | TRANSDERMAL | Status: DC
  Administered 2018-08-28 – 2018-08-31 (×2): 75 ug via TRANSDERMAL
  Filled 2018-08-28 (×2): qty 1
  Filled 2018-08-28: qty 6

## 2018-08-28 MED ORDER — CLONAZEPAM 1 MG PO TABS
1.0000 mg | ORAL_TABLET | Freq: Two times a day (BID) | ORAL | Status: DC
  Administered 2018-08-28 – 2018-09-02 (×11): 1 mg
  Filled 2018-08-28 (×12): qty 1

## 2018-08-28 MED ORDER — METOPROLOL TARTRATE 5 MG/5ML IV SOLN
5.0000 mg | Freq: Four times a day (QID) | INTRAVENOUS | Status: DC | PRN
  Administered 2018-08-31 – 2018-09-03 (×2): 5 mg via INTRAVENOUS
  Filled 2018-08-28 (×3): qty 5
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
## 2018-08-28 NOTE — Progress Notes (Signed)
Peripherally Inserted Central Catheter/Midline Placement  The IV Nurse has discussed with the patient and/or persons authorized to consent for the patient, the purpose of this procedure and the potential benefits and risks involved with this procedure.  The benefits include less needle sticks, lab draws from the catheter, and the patient may be discharged home with the catheter. Risks include, but not limited to, infection, bleeding, blood clot (thrombus formation), and puncture of an artery; nerve damage and irregular heartbeat and possibility to perform a PICC exchange if needed/ordered by physician.  Alternatives to this procedure were also discussed.  Bard Power PICC patient education guide, fact sheet on infection prevention and patient information card has been provided to patient /or left at bedside.    PICC/Midline Placement Documentation  PICC Triple Lumen 08/28/18 PICC Right Brachial 35 cm 0 cm (Active)  Indication for Insertion or Continuance of Line Prolonged intravenous therapies 08/28/2018  8:00 AM  Exposed Catheter (cm) 0 cm 08/28/2018  8:00 AM  Site Assessment Clean;Dry;Intact 08/28/2018  8:00 AM  Lumen #1 Status Flushed;Blood return noted 08/28/2018  8:00 AM  Lumen #2 Status Flushed;Blood return noted 08/28/2018  8:00 AM  Lumen #3 Status Flushed;Blood return noted 08/28/2018  8:00 AM  Dressing Type Transparent 08/28/2018  8:00 AM  Dressing Status Clean;Dry;Intact;Antimicrobial disc in place 08/28/2018  8:00 AM  Dressing Change Due 09/04/18 08/28/2018  8:00 AM       Jule Economy Horton 08/28/2018, 8:45 AM

## 2018-08-28 NOTE — Progress Notes (Signed)
[place] Progress Note Patient Name: AVIAH SORCI DOB: Nov 09, 1965 MRN: [id number]   Date of Service  08/28/2018  HPI/Events of Note  K  3.2. Discussed with bed side RN. Cr > 5.  eICU Interventions  DC Pot SA 40 meq oral for 6 Risk Evaluation and Mitigation Strategies (REMS)  The medication you are ordering is associated with Risk Evaluation and Mitigation Strategies (REMS) and has Elements to [employer] (ETASU).   Please use the link below to the FDA website to assure all ETASU are addressed for this medication.  [email or web address] ( bed side not aware of that order and who ordered it). Kcl 20 meq IV over 2 hrs for now.      Intervention Category Minor Interventions: Electrolytes abnormality - evaluation and management  Elmer Sow 08/28/2018, 6:33 AM

## 2018-08-28 NOTE — Progress Notes (Signed)
Dressings changed, the midline is pretty soupy and some fat necrosis.   Going to change to wet to dry for now and see if we can clean it up some.    About 30% white non nonviable tissue. The lower portion of this picture is her left side.  Hopefully this will slough in time.

## 2018-08-28 NOTE — Progress Notes (Signed)
Called by [employer] lab at [facility]- BCx Fusobacterium nucleatum

## 2018-08-28 NOTE — Progress Notes (Signed)
Pharmacy Antibiotic Note  Julie Williams is a 53 y.o. female admitted on 08/23/2018 with peritonitis uremia enterococcus and fusobacterium.  Pharmacy has been consulted for daptomycin and meropenem dosing. WBC 15, 102.3 fever. Patient has been on zosyn since 12/30   Plan: Stop zosyn Daptomycin 640mg  (8mg /kg) IV every 48 hours Meropenem 1000mg  IV every 12 hours Follow-up clinical improvement, signs/symptoms of infection, wbc count, serum creatinine.  Weight: 177 lb 14.6 oz (80.7 kg)  Temp (24hrs), Avg:100.5 F (38.1 C), Min:98.6 F (37 C), Max:102.3 F (39.1 C)  Recent Labs  Lab 08/23/18 2214 08/24/18 0240 08/24/18 1130 08/25/18 0013 08/25/18 0407 08/26/18 0317 08/27/18 0415 08/27/18 1003 08/28/18 0420  WBC  --  10.2 8.6  --  9.7 12.0*  --  16.8* 15.0*  CREATININE  --  3.61*  --   --  4.36* 4.68* 4.64*  --  5.47*  LATICACIDVEN 2.9* 1.8  --  1.3  --   --   --   --   --     Estimated Creatinine Clearance: 12.3 mL/min (A) (by C-G formula based on SCr of 5.47 mg/dL (H)).    Allergies  Allergen Reactions  . Bee Venom Swelling    Antimicrobials this admission: Zosyn 12/30 >> 1/3 Daptomycin 1/1 x1, 1/3 >> Vancomycin 12/30 >> 12/30 Meropenem 1/3 >>   Microbiology results: 12/29 blood cx >> NGTD 12/29 urine cx >> NEG 12/29 resp cx >> yeast 1/3 MRSA PCR >> positive  [facility]) cultures: 12/25 blood cx 4/4: enterococcus faecalis, fusobacterium nucleatum    Thank you for allowing pharmacy to be a part of this patient's care.  Tamela Gammon, PharmD 08/28/2018 1:34 PM PGY-1 Pharmacy Resident Direct Phone: [phone] Please check [email or web address] for unit-specific pharmacist phone numbers

## 2018-08-28 NOTE — Progress Notes (Signed)
Pt biting tube and continuously alarming. RT afraid pt will bite through ETT, pt has a history of biting through ETTs in the past. RT placed bite block in on right side and will rotate location during assessments. RT will continue to monitor.

## 2018-08-28 NOTE — Progress Notes (Signed)
eLink Physician-Brief Progress Note Patient Name: JAKERA BEAUPRE DOB: 11-24-1965 MRN: [id number]   Date of Service  08/28/2018  HPI/Events of Note  On and off RVR. On amiodarone. AM labs just drawn. Making urine. HR now > 130. MAP fine. Cr > 4, last K 3.5  eICU Interventions  -Cardizem 10 mg IV once - follow electrolytes.      Intervention Category Major Interventions: Arrhythmia - evaluation and management Minor Interventions: Electrolytes abnormality - evaluation and management;Communication with other healthcare providers and/or family  Elmer Sow 08/28/2018, 4:31 AM

## 2018-08-28 NOTE — Progress Notes (Signed)
Subjective/Chief Complaint: Intubated, sedated, tol tf at 20 per hour   Objective: Vital signs in last 24 hours: Temp:  [98.6 F (37 C)-101.7 F (38.7 C)] 101.6 F (38.7 C) (01/03 0727) Pulse Rate:  [108-149] 139 (01/03 0728) Resp:  [15-31] 24 (01/03 0728) BP: (89-149)/(55-111) 122/94 (01/03 0728) SpO2:  [91 %-99 %] 93 % (01/03 0728) FiO2 (%):  [40 %] 40 % (01/03 0728) Weight:  [80.7 kg] 80.7 kg (01/03 0420) Last BM Date: 08/27/18  Intake/Output from previous day: 01/02 0701 - 01/03 0700 In: 2089.6 [I.V.:1507.8; NG/GT:370; IV Piggyback:211.9] Out: 2475 [Urine:2025; Drains:75; Stool:375] Intake/Output this shift: No intake/output data recorded.  GI: vac in place draining some murky fluid, stoma pink and functional, jp with serous fluid, few bs  Lab Results:  Recent Labs    08/27/18 1003 08/28/18 0420  WBC 16.8* 15.0*  HGB 8.8* 8.0*  HCT 27.3* 24.2*  PLT 401* 403*   BMET Recent Labs    08/27/18 0415 08/28/18 0420  NA 139 141  K 3.5 3.2*  CL 106 105  CO2 20* 22  GLUCOSE 90 110*  BUN 70* 70*  CREATININE 4.64* 5.47*  CALCIUM 7.8* 7.5*   PT/INR No results for input(s): LABPROT, INR in the last 72 hours. ABG No results for input(s): PHART, HCO3 in the last 72 hours.  Invalid input(s): PCO2, PO2  Studies/Results: Ct Abdomen Pelvis Wo Contrast  Result Date: 08/26/2018 CLINICAL DATA:  Postop day 7 from emergent proximal rectum and sigmoid colectomy with descending colostomy due to perforation. Patient developed septic shock and aspiration pneumonia the day after surgery. Patient has increasing fever and leukocytosis, and there is concern for possible abscess. EXAM: CT ABDOMEN AND PELVIS WITHOUT CONTRAST TECHNIQUE: Multidetector CT imaging of the abdomen and pelvis was performed following the standard protocol without IV contrast. Oral contrast was administered via the nasogastric tube. COMPARISON:  08/23/2018, 08/19/2018. FINDINGS: Lower chest: Dense airspace  consolidation with air bronchograms involving the lower lobes and RIGHT MIDDLE LOBE. Airspace opacities in the lingula. Small BILATERAL pleural effusions. Upper normal heart size. Central venous catheter tip at the cavoatrial junction. Hepatobiliary: Hepatomegaly and geographic areas of hepatic steatosis as noted previously. No up attic parenchymal masses, allowing for the unenhanced technique. Gallbladder normal in appearance without calcified gallstones. No biliary ductal dilation. Pancreas: Normal unenhanced appearance. Spleen: Normal unenhanced appearance. Adrenals/Urinary Tract: Normal appearing adrenal glands. No evidence of urinary tract calculi. Within the limits of the unenhanced technique, no focal parenchymal abnormality involving either kidney. No evidence of hydronephrosis involving either kidney. Urinary bladder decompressed by Foley catheter. Stomach/Bowel: Nasogastric tube tip in the fundus of the normal appearing stomach. Normal-appearing small bowel. Mild wall thickening involving the cecum and proximal ascending colon. Remainder of the colon normal in appearance. Normal-appearing descending colostomy in the LEFT mid abdomen. Vascular/Lymphatic: Mild aortoiliac atherosclerosis without evidence of aneurysm. Scattered normal size reactive lymph nodes in the abdomen and pelvis without evidence of pathologic lymphadenopathy. Reproductive: Normal-appearing retroflexed uterus. No adnexal masses. Other: Surgical drainage catheter in place in the pelvis. Small amount of intra-abdominal and pelvic ascites, not significantly changed since the CT 3 days ago. No visible isolated fluid collection to suggest abscess. Diffuse body wall edema. Musculoskeletal: Mild degenerative disc disease and spondylosis at L5-S1. No acute findings. IMPRESSION: 1. Small amount of intra-abdominal and pelvic ascites, not significantly changed since the CT 3 days ago. No evidence of abscess. 2. Mild wall thickening involving the  cecum and proximal ascending colon, query colitis. 3.  BILATERAL lower lobe, RIGHT MIDDLE LOBE and lingular pneumonia associated with small BILATERAL pleural effusions. 4. Hepatomegaly with geographic hepatic steatosis. Electronically Signed   By: Evangeline Dakin M.D.   On: 08/26/2018 19:29   Dg Chest 1 View  Result Date: 08/26/2018 CLINICAL DATA:  Intubated EXAM: CHEST  1 VIEW COMPARISON:  08/26/2017, 08/25/2018, 08/24/2018 FINDINGS: Endotracheal tube tip is about 3.2 cm superior to the carina. Esophageal tube tip is in the left upper quadrant. Left IJ central venous catheter tip over the proximal right atrium. Worsened bilateral airspace disease. Continued pleural effusions. Stable mild cardiomegaly. No pneumothorax. IMPRESSION: 1. Endotracheal tube tip about 3.2 cm superior to carina. Left IJ central venous catheter tip over the proximal right atrium 2. Extensive airspace disease appears slightly worse in the interim. Continued pleural effusions. Stable mild cardiomegaly. Electronically Signed   By: Donavan Foil M.D.   On: 08/26/2018 19:55   Dg Chest Port 1 View  Result Date: 08/27/2018 CLINICAL DATA:  Acute onset of respiratory failure. Hypoxia. EXAM: PORTABLE CHEST 1 VIEW COMPARISON:  Chest radiograph performed 08/26/2018 FINDINGS: The patient's endotracheal tube is seen ending 2-3 cm above the carina. An enteric tube is noted extending below the diaphragm. The left IJ line is noted ending about the cavoatrial junction. Persistent bilateral airspace opacification, left greater than right, is relatively similar in appearance. Small bilateral pleural effusions are suspected. No pneumothorax is seen. The cardiomediastinal silhouette is normal in size. No acute osseous abnormalities are identified. IMPRESSION: 1. Endotracheal tube seen ending 2-3 cm above the carina. 2. Persistent bilateral airspace opacification, left greater than right, is relatively similar in appearance, concerning for multifocal  pneumonia. Small bilateral pleural effusions suspected. Electronically Signed   By: Garald Balding M.D.   On: 08/27/2018 07:02   Dg Chest Port 1 View  Result Date: 08/26/2018 CLINICAL DATA:  Intubated EXAM: PORTABLE CHEST 1 VIEW COMPARISON:  08/26/2017, 08/25/2018, 08/24/2018, 08/23/2018 FINDINGS: Endotracheal tube tip is about 3 cm superior to the carina. Esophageal tube tip below the diaphragm. Left IJ central venous catheter tip over the proximal right atrium. Slightly improved aeration at the right base. Continued bilateral left greater than right airspace disease. Probable small effusions. Stable enlarged cardiomediastinal silhouette. IMPRESSION: 1. Endotracheal tube tip about 3 cm superior to carina 2. Slight improvement in aeration of right base. Left greater than right pulmonary airspace disease and probable pleural effusions are otherwise unchanged. Electronically Signed   By: Donavan Foil M.D.   On: 08/26/2018 21:04   [place] Ekg Site Rite  Result Date: 08/27/2018 If Site Rite image not attached, placement could not be confirmed due to current cardiac rhythm.   Anti-infectives: Anti-infectives (From admission, onward)   Start     Dose/Rate Route Frequency Ordered Stop   08/26/18 1030  DAPTOmycin (CUBICIN) 618.5 mg in sodium chloride 0.9 % IVPB  Status:  Discontinued     8 mg/kg  77.3 kg 224.7 mL/hr over 30 Minutes Intravenous Every 48 hours 08/26/18 0934 08/26/18 0935   08/26/18 1030  DAPTOmycin (CUBICIN) 620 mg in sodium chloride 0.9 % IVPB  Status:  Discontinued     620 mg 224.8 mL/hr over 30 Minutes Intravenous Every 48 hours 08/26/18 0935 08/27/18 1038   08/25/18 0300  vancomycin (VANCOCIN) IVPB 1000 mg/200 mL premix  Status:  Discontinued     1,000 mg 200 mL/hr over 60 Minutes Intravenous  Once 08/24/18 0122 08/24/18 1049   08/24/18 0100  vancomycin (VANCOCIN) IVPB 1000 mg/200 mL premix  Status:  Discontinued     1,000 mg 200 mL/hr over 60 Minutes Intravenous Every 12 hours  08/24/18 0045 08/24/18 0122   08/24/18 0100  piperacillin-tazobactam (ZOSYN) IVPB 2.25 g     2.25 g 100 mL/hr over 30 Minutes Intravenous Every 6 hours 08/24/18 0045        Assessment/Plan: Perforated colon/rectum POD 9 S/p exploratory laparotomy, resection distal sigmoid colon and proximal rectum, diverting colostomy, wound vac placement 12/25 Dr. Noberto Retort -surgical path will need follow up -wound vac changes MWF, please call us today when changing vac - continue JP drain - can advance tube feeds to goal as tolerated -ct other day did not show anything that was amenable to drainage although she is certainly high risk VTE -SCDs, sq heparin FEN -IVF, tube feeds TPN Foley -in place Enterococcal bacteremia -ID following Depression Alcohol abuse Chronic pain/narcotic dependence - On Morphine at home VDRF/ARDS - aspiration pneumonia -CCM  Acute renal failure -creatinine 5.47 Anemia -H/H: 8.0/24.2   Rolm Bookbinder 08/28/2018
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
## 2018-08-28 NOTE — Consult Note (Signed)
[facility] Nurse wound follow up Wound type:surgical  Measurement: see previous note Wound ZSM:[id number] fat necrosis, wound bed more soupy Drainage (amount, consistency, odor) reddish brown, no odor noted by WOC nurse Periwound: intact  Dressing procedure/placement/frequency: Removed old NPWT dressing with Will Jennings at bedside. Repacked with saline dampened gauze. Will covered with dry dressing, secured with tape. Will CCS PA reported to enter new orders for saline moist dressing TID.   [facility] Nurse ostomy follow up Stoma type/location: LLQ, end colostomy Stomal assessment/size:  1 3/4" round, less edematous, some mucosal sloughing along the left lateral edge of the stoma  Peristomal assessment: intact  Treatment options for stomal/peristomal skin: skin barrier ring Output liquid green/brown, staff report high volumes Ostomy pouching: 2pc. 2 3/4" with 2" skin barrier Education provided: none, patient sedated on the vent, no family in the room Enrolled patient in [place] [facility] Discharge program: No  WOC team will follow along for support with ostomy and wound care  Extra ostomy supplies in the patient's room should staff need to change the pouch.   [place], [place], [place]

## 2018-08-28 NOTE — Progress Notes (Signed)
Patient ID: Julie Williams, female   DOB: 10/24/1965, 53 y.o.   MRN: [id number]         [facility]  Date of Admission:  08/23/2018   Total days of antibiotics 10        Day 6 piperacillin tazobactam         ASSESSMENT: Julie Williams has peritonitis uremia enterococcus and Fusobacterium complicating recent rectal perforation.  Her condition is further complicated by superimposed pneumonia and abdominal wound infection.  She has persistent high fevers and worsening renal dysfunction.  Broaden her antibiotic therapy to meropenem and restart daptomycin.  She is at risk for fungal infection but I would not add empiric antifungal therapy yet.  We will follow with you.  PLAN: 1. Change piperacillin tazobactam to meropenem 2. Restart daptomycin  Principal Problem:   Polymicrobial bacterial infection Active Problems:   Stercoral ulcer of rectum   Rectal perforation (HCC)   Peritonitis (HCC)   ARDS (adult respiratory distress syndrome) (HCC)   Acute respiratory failure with hypoxemia (HCC)   Endotracheal tube present   Scheduled Meds: . amiodarone  200 mg Oral BID  . chlorhexidine gluconate (MEDLINE KIT)  15 mL Mouth Rinse BID  . Chlorhexidine Gluconate Cloth  6 each Topical Q0600  . Chlorhexidine Gluconate Cloth  6 each Topical Q0600  . Chlorhexidine Gluconate Cloth  6 each Topical Daily  . clonazePAM  1 mg Per Tube BID  . feeding supplement (VITAL HIGH PROTEIN)  1,000 mL Per Tube Q24H  . fentaNYL  75 mcg Transdermal Q72H  . heparin  5,000 Units Subcutaneous Q8H  . insulin aspart  0-9 Units Subcutaneous Q6H  . mouth rinse  15 mL Mouth Rinse 10 times per day  . metoprolol tartrate  25 mg Oral BID  . mupirocin ointment  1 application Nasal BID  . pantoprazole sodium  40 mg Per Tube Daily  . sodium chloride flush  10-40 mL Intracatheter Q12H  . sodium chloride flush  10-40 mL Intracatheter Q12H   Continuous Infusions: . sodium chloride Stopped (08/28/18  0945)  . fentaNYL infusion INTRAVENOUS Stopped (08/28/18 0924)  . piperacillin-tazobactam (ZOSYN)  IV 2.25 g (08/28/18 1126)   PRN Meds:.sodium chloride, acetaminophen (TYLENOL) oral liquid 160 mg/5 mL, fentaNYL (SUBLIMAZE) injection, metoprolol tartrate, midazolam, sodium chloride flush, sodium chloride flush   Review of Systems: Review of Systems  Unable to perform ROS: Intubated    Allergies  Allergen Reactions  . Bee Venom Swelling    OBJECTIVE: Vitals:   08/28/18 1100 08/28/18 1113 08/28/18 1129 08/28/18 1200  BP: 135/81  134/83 118/80  Pulse:   (!) 114   Resp: (!) 28  (!) 29   Temp:  (!) 102.3 F (39.1 C)    TempSrc:  Axillary    SpO2: 97%  96% 94%  Weight:       Body mass index is 29.61 kg/m.  Physical Exam Constitutional:      Comments: She is intubated, alert but not responding to commands.  She has been weaning from the ventilator.  Cardiovascular:     Rate and Rhythm: Normal rate and regular rhythm.     Heart sounds: No murmur.  Pulmonary:     Breath sounds: Rhonchi present.  Abdominal:     General: There is distension.     Palpations: Abdomen is soft.     Tenderness: There is abdominal tenderness.     Comments: She has a left-sided colostomy with some sloughing skin around  her stoma.  Her nurse reports that she has a increased, purulent drainage midline abdominal wound.       Lab Results Lab Results  Component Value Date   WBC 15.0 (H) 08/28/2018   HGB 8.0 (L) 08/28/2018   HCT 24.2 (L) 08/28/2018   MCV 96.4 08/28/2018   PLT 403 (H) 08/28/2018    Lab Results  Component Value Date   CREATININE 5.47 (H) 08/28/2018   BUN 70 (H) 08/28/2018   NA 141 08/28/2018   K 3.2 (L) 08/28/2018   CL 105 08/28/2018   CO2 22 08/28/2018    Lab Results  Component Value Date   ALT 31 08/27/2018   AST 38 08/27/2018   ALKPHOS 54 08/27/2018   BILITOT 0.6 08/27/2018     Microbiology: Recent Results (from the past 240 hour(s))  Urine culture     Status:  None   Collection Time: 08/23/18  5:36 PM  Result Value Ref Range Status   Specimen Description URINE, RANDOM  Final   Special Requests   Final    NONE Performed at [facility], [place] [address], [place], [place] [address]    Culture NO GROWTH  Final   Report Status 08/24/2018 FINAL  Final  Culture, respiratory (tracheal aspirate)     Status: None   Collection Time: 08/23/18  5:36 PM  Result Value Ref Range Status   Specimen Description TRACHEAL ASPIRATE  Final   Special Requests NONE  Final   Gram Stain   Final    FEW WBC PRESENT,BOTH PMN AND MONONUCLEAR FEW YEAST WITH PSEUDOHYPHAE Performed at [facility], [place] [address], [place], [place] [address]    Culture FEW CANDIDA ALBICANS  Final   Report Status 08/25/2018 FINAL  Final  Culture, blood (routine x 2)     Status: None   Collection Time: 08/23/18  7:50 PM  Result Value Ref Range Status   Specimen Description BLOOD LEFT ANTECUBITAL  Final   Special Requests   Final    BOTTLES DRAWN AEROBIC AND ANAEROBIC Blood Culture adequate volume   Culture   Final    NO GROWTH 5 DAYS Performed at [facility], [place] [address] [place], [place] [address]    Report Status 08/28/2018 FINAL  Final  Culture, blood (routine x 2)     Status: None   Collection Time: 08/23/18 10:14 PM  Result Value Ref Range Status   Specimen Description BLOOD RIGHT ANTECUBITAL  Final   Special Requests   Final    BOTTLES DRAWN AEROBIC AND ANAEROBIC Blood Culture adequate volume   Culture   Final    NO GROWTH 5 DAYS Performed at [facility], [place] [address] [place], [place] [address]    Report Status 08/28/2018 FINAL  Final  Culture, blood (Routine X 2) w Reflex to ID Panel     Status: None (Preliminary result)   Collection Time: 08/26/18  1:20 PM  Result Value Ref Range Status   Specimen Description BLOOD BLOOD RIGHT HAND  Final   Special Requests AEROBIC BOTTLE ONLY Blood Culture adequate volume  Final   Culture   Final    NO  GROWTH 2 DAYS Performed at [facility], [place] [address] [place], [place] [address]    Report Status PENDING  Incomplete  Culture, blood (Routine X 2) w Reflex to ID Panel     Status: None (Preliminary result)   Collection Time: 08/26/18  1:20 PM  Result Value Ref Range Status   Specimen Description BLOOD BLOOD LEFT HAND  Final   Special Requests AEROBIC BOTTLE ONLY Blood Culture adequate volume  Final   Culture   Final    NO GROWTH 2 DAYS Performed at [facility], [address], [place], [place] [address]    Report Status PENDING  Incomplete  Culture, respiratory (non-expectorated)     Status: None (Preliminary result)   Collection Time: 08/26/18  1:33 PM  Result Value Ref Range Status   Specimen Description TRACHEAL ASPIRATE  Final   Special Requests NONE  Final   Gram Stain RARE WBC PRESENT, PREDOMINANTLY PMN RARE YEAST   Final   Culture   Final    FEW YEAST IDENTIFICATION TO FOLLOW Performed at [facility], [place] [address] [place], [place] [address]    Report Status PENDING  Incomplete  MRSA PCR Screening     Status: Abnormal   Collection Time: 08/28/18  8:21 AM  Result Value Ref Range Status   MRSA by PCR POSITIVE (A) NEGATIVE Final    Comment:        The GeneXpert MRSA Assay (FDA approved for NASAL specimens only), is one component of a comprehensive MRSA colonization surveillance program. It is not intended to diagnose MRSA infection nor to guide or monitor treatment for MRSA infections. RESULT CALLED TO, READ BACK BY AND VERIFIED WITH: Annette Stable RN 10:50 08/28/17 ([email or web address]) Performed at [facility], [place] [address], [place], [place] [address]     Michel Bickers, [place] [facility] [phone] pager   [phone] cell 08/28/2018, 12:55 PM

## 2018-08-29 ENCOUNTER — Other Ambulatory Visit: Payer: Self-pay

## 2018-08-29 ENCOUNTER — Encounter (HOSPITAL_COMMUNITY): Payer: Self-pay

## 2018-08-29 DIAGNOSIS — K659 Peritonitis, unspecified: Secondary | ICD-10-CM

## 2018-08-29 DIAGNOSIS — Z930 Tracheostomy status: Secondary | ICD-10-CM

## 2018-08-29 DIAGNOSIS — J158 Pneumonia due to other specified bacteria: Secondary | ICD-10-CM

## 2018-08-29 DIAGNOSIS — B9689 Other specified bacterial agents as the cause of diseases classified elsewhere: Secondary | ICD-10-CM

## 2018-08-29 LAB — CBC WITH DIFFERENTIAL/PLATELET
ABS IMMATURE GRANULOCYTES: 0.31 10*3/uL — AB (ref 0.00–0.07)
Basophils Absolute: 0 10*3/uL (ref 0.0–0.1)
Basophils Relative: 0 %
Eosinophils Absolute: 0.1 10*3/uL (ref 0.0–0.5)
Eosinophils Relative: 1 %
HCT: 22.1 % — ABNORMAL LOW (ref 36.0–46.0)
Hemoglobin: 7.2 g/dL — ABNORMAL LOW (ref 12.0–15.0)
Immature Granulocytes: 3 %
Lymphocytes Relative: 10 %
Lymphs Abs: 1.1 10*3/uL (ref 0.7–4.0)
MCH: 30.9 pg (ref 26.0–34.0)
MCHC: 32.6 g/dL (ref 30.0–36.0)
MCV: 94.8 fL (ref 80.0–100.0)
Monocytes Absolute: 0.3 10*3/uL (ref 0.1–1.0)
Monocytes Relative: 3 %
Neutro Abs: 9.3 10*3/uL — ABNORMAL HIGH (ref 1.7–7.7)
Neutrophils Relative %: 83 %
Platelets: 383 10*3/uL (ref 150–400)
RBC: 2.33 MIL/uL — ABNORMAL LOW (ref 3.87–5.11)
RDW: 15.4 % (ref 11.5–15.5)
WBC: 11.1 10*3/uL — ABNORMAL HIGH (ref 4.0–10.5)
nRBC: 0 % (ref 0.0–0.2)

## 2018-08-29 LAB — BASIC METABOLIC PANEL
Anion gap: 16 — ABNORMAL HIGH (ref 5–15)
BUN: 75 mg/dL — ABNORMAL HIGH (ref 6–20)
CO2: 19 mmol/L — AB (ref 22–32)
Calcium: 7.5 mg/dL — ABNORMAL LOW (ref 8.9–10.3)
Chloride: 106 mmol/L (ref 98–111)
Creatinine, Ser: 5.86 mg/dL — ABNORMAL HIGH (ref 0.44–1.00)
GFR calc Af Amer: 9 mL/min — ABNORMAL LOW (ref >60)
GFR calc non Af Amer: 8 mL/min — ABNORMAL LOW (ref >60)
Glucose, Bld: 94 mg/dL (ref 70–99)
Potassium: 3.2 mmol/L — ABNORMAL LOW (ref 3.5–5.1)
SODIUM: 141 mmol/L (ref 135–145)

## 2018-08-29 LAB — TRIGLYCERIDES: Triglycerides: 636 mg/dL — ABNORMAL HIGH (ref <150)

## 2018-08-29 LAB — GLUCOSE, CAPILLARY
Glucose-Capillary: 81 mg/dL (ref 70–99)
Glucose-Capillary: 93 mg/dL (ref 70–99)
Glucose-Capillary: 92 mg/dL (ref 70–99)
Glucose-Capillary: 91 mg/dL (ref 70–99)
Glucose-Capillary: 95 mg/dL (ref 70–99)

## 2018-08-29 LAB — MAGNESIUM: Magnesium: 2.1 mg/dL (ref 1.7–2.4)

## 2018-08-29 LAB — CULTURE, RESPIRATORY

## 2018-08-29 LAB — CULTURE, RESPIRATORY W GRAM STAIN

## 2018-08-29 MED ORDER — POTASSIUM CHLORIDE 10 MEQ/100ML IV SOLN
10.0000 meq | INTRAVENOUS | Status: AC
  Administered 2018-08-29 (×2): 10 meq via INTRAVENOUS
  Filled 2018-08-29 (×2): qty 100

## 2018-08-29 MED ORDER — PROPOFOL 1000 MG/100ML IV EMUL
5.0000 ug/kg/min | INTRAVENOUS | Status: DC
  Administered 2018-08-29 – 2018-08-30 (×3): 20 ug/kg/min via INTRAVENOUS
  Administered 2018-08-31: 10 ug/kg/min via INTRAVENOUS
  Filled 2018-08-29 (×4): qty 100

## 2018-08-29 MED ORDER — CHLORHEXIDINE GLUCONATE CLOTH 2 % EX PADS
6.0000 | MEDICATED_PAD | Freq: Every day | CUTANEOUS | Status: DC
  Administered 2018-08-30 – 2018-09-13 (×12): 6 via TOPICAL

## 2018-08-29 NOTE — Progress Notes (Signed)
[facility] Progress Note Patient Name: Julie Williams DOB: July 17, 1966 MRN: [id number]   Date of Service  08/29/2018  HPI/Events of Note  K 3.2, Cr > 5  eICU Interventions  Kcl  Replaced.     Intervention Category Minor Interventions: Electrolytes abnormality - evaluation and management  Elmer Sow 08/29/2018, 6:15 AM

## 2018-08-29 NOTE — Progress Notes (Signed)
[facility]    NEPHROLOGY PROGRESS NOTE  SUBJECTIVE: Sedated, unable to provide review of systems.  No new events.    OBJECTIVE:  Vitals:   08/29/18 0900 08/29/18 1000  BP: 125/85 127/88  Pulse:    Resp: (!) 26 (!) 22  Temp: 99 F (37.2 C)   SpO2: 96% 98%   I/O last 3 completed shifts: In: 2583.6 [I.V.:1124.4; NG/GT:841.2; IV [id number]:250] Out: 5397 [Urine:3550; Drains:105; Stool:1000]   Genearl:  sedated NAD HEENT: MMM [place] AT anicteric sclera Neck:  No JVD, no adenopathy CV:  Heart RRR  Lungs:  L/S CTA bilaterally Abd:  abd mildly distended GU:  Bladder non-palpable Extremities: (+)1 bilateral LE edema. Skin:  No skin rash  MEDICATIONS:   Current Facility-Administered Medications:  .  0.9 %  sodium chloride infusion, , Intravenous, PRN, Juanito Doom, MD, Stopped at 08/29/18 [phone] .  acetaminophen (TYLENOL) solution 650 mg, 650 mg, Per Tube, Q6H PRN, Simonne Maffucci B, MD, 650 mg at 08/29/18 0509 .  amiodarone (PACERONE) tablet 200 mg, 200 mg, Oral, BID, Velna Ochs, MD, 200 mg at 08/29/18 1937 .  chlorhexidine gluconate (MEDLINE KIT) (PERIDEX) 0.12 % solution 15 mL, 15 mL, Mouth Rinse, BID, Rush Farmer, MD, 15 mL at 08/29/18 0738 .  [START ON 08/30/2018] Chlorhexidine Gluconate Cloth 2 % PADS 6 each, 6 each, Topical, Daily, McQuaid, Douglas B, MD .  clonazePAM (KLONOPIN) tablet 1 mg, 1 mg, Per Tube, BID, Velna Ochs, MD, 1 mg at 08/29/18 0939 .  DAPTOmycin (CUBICIN) 640 mg in sodium chloride 0.9 % IVPB, 640 mg, Intravenous, Q48H, Michel Bickers, MD, Last Rate: 226 mL/hr at 08/28/18 1800 .  feeding supplement (VITAL AF 1.2 CAL) liquid 1,000 mL, 1,000 mL, Per Tube, Continuous, McQuaid, Douglas B, MD, Last Rate: 20 mL/hr at 08/29/18 0700 .  fentaNYL (DURAGESIC - dosed mcg/hr) 75 mcg, 75 mcg, Transdermal, Q72H, Simonne Maffucci B, MD, 75 mcg at 08/28/18 1116 .  fentaNYL (SUBLIMAZE) injection 100 mcg, 100 mcg, Intravenous, Q2H PRN,  Simonne Maffucci B, MD, 100 mcg at 08/29/18 [phone] .  fentaNYL 2529mg in NS 2520m(1092mml) infusion-PREMIX, 25-100 mcg/hr, Intravenous, Continuous, McQuaid, Douglas B, MD, Last Rate: 10 mL/hr at 08/29/18 1000, 100 mcg/hr at 08/29/18 1000 .  heparin injection 5,000 Units, 5,000 Units, Subcutaneous, Q8H, YacRush FarmerD, 5,000 Units at 08/29/18 0509 .  insulin aspart (novoLOG) injection 0-9 Units, 0-9 Units, Subcutaneous, Q6H, Batchelder, NatCecilio AsperPH .  MEDLINE mouth rinse, 15 mL, Mouth Rinse, 10 times per day, YacRush FarmerD, 15 mL at 08/29/18 0943 .  meropenem (MERREM) 1 g in sodium chloride 0.9 % 100 mL IVPB, 1 g, Intravenous, Q12H, CamMichel BickersD, Last Rate: 200 mL/hr at 08/29/18 1000 .  metoprolol tartrate (LOPRESSOR) injection 5 mg, 5 mg, Intravenous, Q6H PRN, GuiVelna OchsD .  metoprolol tartrate (LOPRESSOR) tablet 25 mg, 25 mg, Oral, BID, GuiVelna OchsD, 25 mg at 08/29/18 [id number] midazolam (VERSED) injection 2 mg, 2 mg, Intravenous, Q2H PRN, McQSimonne Maffucci MD, 2 mg at 08/29/18 0509 .  mupirocin ointment (BACTROBAN) 2 % 1 application, 1 application, Nasal, BID, McQJuanito DoomD, 1 application at 01/53/29/[phone] pantoprazole sodium (PROTONIX) 40 mg/20 mL oral suspension 40 mg, 40 mg, Per Tube, Daily, Masters, AliJake ChurchPH, 40 mg at 08/28/18 2106 .  sodium chloride flush (NS) 0.9 % injection 10-40 mL, 10-40 mL, Intracatheter, Q12H, McQuaid, Douglas B, MD, 10 mL at 08/28/18 2111 .  sodium chloride flush (NS) 0.9 % injection 10-40 mL, 10-40 mL, Intracatheter, PRN, McQuaid, Douglas B, MD .  sodium chloride flush (NS) 0.9 % injection 10-40 mL, 10-40 mL, Intracatheter, Q12H, McQuaid, Douglas B, MD, 10 mL at 08/29/18 0943 .  sodium chloride flush (NS) 0.9 % injection 10-40 mL, 10-40 mL, Intracatheter, PRN, Juanito Doom, MD     LABS:  CBC Latest Ref Rng & Units 08/28/2018 08/27/2018 08/26/2018  WBC 4.0 - 10.5 K/uL 15.0(H) 16.8(H) 12.0(H)  Hemoglobin 12.0 -  15.0 g/dL 8.0(L) 8.8(L) 8.0(L)  Hematocrit 36.0 - 46.0 % 24.2(L) 27.3(L) 25.9(L)  Platelets 150 - 400 K/uL 403(H) 401(H) 204    CMP Latest Ref Rng & Units 08/29/2018 08/28/2018 08/27/2018  Glucose 70 - 99 mg/dL 94 110(H) 90  BUN 6 - 20 mg/dL 75(H) 70(H) 70(H)  Creatinine 0.44 - 1.00 mg/dL 5.86(H) 5.47(H) 4.64(H)  Sodium 135 - 145 mmol/L 141 141 139  Potassium 3.5 - 5.1 mmol/L 3.2(L) 3.2(L) 3.5  Chloride 98 - 111 mmol/L 106 105 106  CO2 22 - 32 mmol/L 19(L) 22 20(L)  Calcium 8.9 - 10.3 mg/dL 7.5(L) 7.5(L) 7.8(L)  Total Protein 6.5 - 8.1 g/dL - - 5.7(L)  Total Bilirubin 0.3 - 1.2 mg/dL - - 0.6  Alkaline Phos 38 - 126 U/L - - 54  AST 15 - 41 U/L - - 38  ALT 0 - 44 U/L - - 31    Lab Results  Component Value Date   CALCIUM 7.5 (L) 08/29/2018   CAION 1.19 08/30/2008   PHOS 4.5 08/27/2018       Component Value Date/Time   COLORURINE YELLOW 08/26/2018 1255   APPEARANCEUR HAZY (A) 08/26/2018 1255   LABSPEC 1.011 08/26/2018 1255   PHURINE 5.0 08/26/2018 1255   GLUCOSEU NEGATIVE 08/26/2018 1255   HGBUR MODERATE (A) 08/26/2018 1255   BILIRUBINUR NEGATIVE 08/26/2018 [place] 08/26/2018 1255   PROTEINUR 30 (A) 08/26/2018 1255   NITRITE NEGATIVE 08/26/2018 1255   LEUKOCYTESUR NEGATIVE 08/26/2018 1255      Component Value Date/Time   PHART 7.443 08/24/2018 0256   PCO2ART 30.6 (L) 08/24/2018 0256   PO2ART 91.0 08/24/2018 0256   HCO3 20.9 08/24/2018 0256   TCO2 22 08/24/2018 0256   ACIDBASEDEF 3.0 (H) 08/24/2018 0256   O2SAT 98.0 08/24/2018 0256       Component Value Date/Time   FERRITIN 235 07/13/2010 2032       ASSESSMENT/PLAN:     Problem List Items Addressed This Visit      Respiratory   ARDS (adult respiratory distress syndrome) ([place])   Relevant Orders   DG Chest Port 1 View (Completed)   Acute respiratory failure with hypoxemia (HCC)   Relevant Orders   DG Chest Port 1 View (Completed)   DG Chest Port 1 View (Completed)   DG Chest Port 1 View      Other   Endotracheal tube present   Relevant Orders   DG Chest 1 View (Completed)    Other Visit Diagnoses    History of ETT       Relevant Orders   DG Chest 1 View (Completed)   DG CHEST PORT 1 VIEW (Completed)   Ileus ([place])       Relevant Orders   DG Abd 1 View (Completed)       1.  Baseline creatinine 0.89 10/2015 2.  AKI.  Likely due to ATN in the setting of vanco/zosyn, shock, and MOSF. Non-oliguric with excellent, and  no evidence of obstruction from CT on 08/26/18.  No need for HD.  Would continue supportive care. 3.  ARDS. Continue vent management.  Is oxygenating well.  Continue negative fluid balance. 4.  Acute Encephalopathy.  Minimize sedation. 5.  Peritonitis.  Continue antibiotics.  Monitor fevers    [employer], DO, FACP

## 2018-08-29 NOTE — Progress Notes (Signed)
NAME:  Julie Williams, MRN:  [id number], DOB:  11-27-1965, LOS: 6 ADMISSION DATE:  08/23/2018, CONSULTATION DATE:  12/29 REFERRING MD:  Luetta Nutting, CHIEF COMPLAINT:  ARDS and peritonitis   Brief History   53 year old woman post rectal perforation following SBO with aspiration and ARDS requiring intubation. CCM was consulted for management of ARDS.  She was initially admit to [facility] on 12/24 with abdominal pain and was noted to have a bowel obstruction with rectal perforation requiring sigmoid colectomy and colostomy.   Past Medical History  Cervical spine osteoarthritis, depression, GERD, hypertension,former cigaretteuse, alcohol use  Significant Hospital Events   12/24 admission to [facility]  12/25 rectal perforation repair, colostomy, NG placed, aspiraiton 12/29 transfer to [facility] for management of ARDS 12/31 mucus plugging, required emergent bronchoscopy 1/2 Afib RVR developed during SBT/WUA    Consults:  PCCM ID General Surgery  Procedures:  ETT 12/28 >> OG tube 12/25 >> L IJ TLC 12/25 >>08/27/17 Wound vac 12/25 >> RLQ drain 12/25 >> Urethral catheter 12/25 >>  12/31 - video bronchoscopy for airway aspiration  Significant Diagnostic Tests:  12/27 - TTE - negative for endocarditis, LVEF 71%, small pericardial fluid, left pleural effusion  12/29 - CT head - NAICP  12/29 CT abdomen pelvis - s/p Partial rectal resection, left lower quadrant colostomy, perc drainage catheter in peritoneal fluid, debris and gas in the pelvis, bibasilar pulmonary consolidation, hepatic steatosis  1/1 -CT abdomen pelvis - small intraabdominal and pelvic ascites, no abscess, mild wall thickening of the cecum and proximal ascending colon, bilateral lower lobe and right middle lobe lingular pneumonia with small bilateral pleural effusions  Micro Data:  12/25 blood cultures ( ) - enterococcus faecalis +/- GNR sensitive to vanc, pen, amp 12/29 - blood cultures >  ngtd  1/1 blood cultures > ngtd  1/1 resp > yeast  12/29 - urine cultures neg  12/29 - respiratory cultures candida  Antimicrobials:  Zosyn 12/30 >> Daptomycin 1/1 >> Gentamycin- 1 dose 08/26/2017  Interim history/subjective:  Some purulent drainage from wound vac yesterday Changed to wet to dry dressing changes  Objective   Blood pressure 95/64, pulse 93, temperature (!) 100.5 F (38.1 C), temperature source Axillary, resp. rate 19, weight 77.8 kg, SpO2 95 %.    Vent Mode: PRVC FiO2 (%):  [40 %] 40 % Set Rate:  [18 bmp] 18 bmp Vt Set:  [450 mL] 450 mL PEEP:  [5 cmH20] 5 cmH20 Pressure Support:  [10 cmH20] 10 cmH20 Plateau Pressure:  [16 cmH20-25 cmH20] 16 cmH20   Intake/Output Summary (Last 24 hours) at 08/29/2018 0758 Last data filed at 08/29/2018 0700 Gross per 24 hour  Intake 1623.94 ml  Output 2670 ml  Net -1046.06 ml   Filed Weights   08/27/18 0408 08/28/18 0420 08/29/18 0418  Weight: 81.5 kg 80.7 kg 77.8 kg    Examination:  General:  In bed on vent HENT: NCAT ETT in place PULM: Rhonchi bilaterally, vent supported breathing CV: RRR, no mgr GI: Wound dressing in place, ostomy bag in place, BS+, nontender MSK: normal bulk and tone Neuro: sedated on vent   Resolved Hospital Problem list     Assessment & Plan:  ARDS: still requires high vent support when on pressure support > continue pressure support wean as able > pulmonary toilette > likely needs tracheostomy  Peritonitis/s/p sigmoid colectomy for rectal perforation Ongoing fevers: broad ddx: resolving ARDS, wound, consider disseminated candidaisis > Antibiotics per ID > Consider antifungal treatment > monitor  blood cultures > continue wound care per general surgery  AKI > hold diuretics > appreciate renal, follow up their recommendations  Acute encephalopathy Narcotic dependence baseline Anxiety > continue fentanyl patch, fentanyl infusion, continue clonazepam   Best practice:  Diet:  Tube feeding Pain/Anxiety/Delirium protocol (if indicated): yes VAP protocol (if indicated): yes DVT prophylaxis: sub q heparin GI prophylaxis: PPI Glucose control: monitor glucose Mobility: bed rest Code Status: full Family Communication: have attempted to contact the patient's son multiple times,  social work has been involved, at this time we still have not been able to reach him Disposition: remain in ICU  Labs   CBC: Recent Labs  Lab 08/23/18 1834 08/24/18 0240 08/24/18 1130 08/25/18 0407 08/26/18 0317 08/27/18 1003 08/28/18 0420  WBC 7.2 10.2 8.6 9.7 12.0* 16.8* 15.0*  NEUTROABS 6.0 8.8* 7.3 7.5 9.8*  --   --   HGB 9.4* 9.3* 9.0* 8.4* 8.0* 8.8* 8.0*  HCT 29.3* 29.2* 27.8* 26.5* 25.9* 27.3* 24.2*  MCV 97.3 96.7 95.5 98.1 98.9 98.6 96.4  PLT PLATELET CLUMPS NOTED ON SMEAR, UNABLE TO ESTIMATE 126* 128* 140* 204 401* 403*    Basic Metabolic Panel: Recent Labs  Lab 08/23/18 1834 08/24/18 0240 08/25/18 0407 08/26/18 0317 08/27/18 0415 08/28/18 0420 08/29/18 0445  NA 136 136 137 140 139 141 141  K 3.1* 3.5 3.3* 3.7 3.5 3.2* 3.2*  CL 101 101 105 107 106 105 106  CO2 21* 19* 22 21* 20* 22 19*  GLUCOSE 69* 103* 118* 92 90 110* 94  BUN 33* 37* 47* 64* 70* 70* 75*  CREATININE 3.40* 3.61* 4.36* 4.68* 4.64* 5.47* 5.86*  CALCIUM 7.5* 7.4* 7.5* 8.0* 7.8* 7.5* 7.5*  MG 1.9 1.9 2.1 2.1 2.0  --  2.1  PHOS 4.8* 4.4 3.9 3.9 4.5  --   --    GFR: Estimated Creatinine Clearance: 11.3 mL/min (A) (by C-G formula based on SCr of 5.86 mg/dL (H)). Recent Labs  Lab 08/23/18 1834 08/23/18 2214 08/24/18 0240  08/25/18 0013 08/25/18 0407 08/26/18 0317 08/27/18 1003 08/28/18 0420  PROCALCITON 44.96  --   --   --   --   --   --   --   --   WBC 7.2  --  10.2   < >  --  9.7 12.0* 16.8* 15.0*  LATICACIDVEN  --  2.9* 1.8  --  1.3  --   --   --   --    < > = values in this interval not displayed.    Liver Function Tests: Recent Labs  Lab 08/23/18 1834 08/24/18 0240  08/27/18 0415  AST 135* 123* 38  ALT 89* 81* 31  ALKPHOS 43 48 54  BILITOT 1.0 1.2 0.6  PROT 5.3* 5.3* 5.7*  ALBUMIN 1.6* 1.7* 1.3*   Recent Labs  Lab 08/23/18 1834  LIPASE 87*  AMYLASE 234*   No results for input(s): AMMONIA in the last 168 hours.  ABG    Component Value Date/Time   PHART 7.443 08/24/2018 0256   PCO2ART 30.6 (L) 08/24/2018 0256   PO2ART 91.0 08/24/2018 0256   HCO3 20.9 08/24/2018 0256   TCO2 22 08/24/2018 0256   ACIDBASEDEF 3.0 (H) 08/24/2018 0256   O2SAT 98.0 08/24/2018 0256     Coagulation Profile: Recent Labs  Lab 08/23/18 1834  INR 1.15    Cardiac Enzymes: Recent Labs  Lab 08/27/18 0415  CKTOTAL 52    HbA1C: No results found for: HGBA1C  CBG: Recent Labs  Lab 08/28/18 1522 08/28/18 1936 08/28/18 2330 08/29/18 0446 08/29/18 0729  GLUCAP 97 93 85 81 93     Critical care time: 35 minutes    Roselie Awkward, MD [place] [facility] Pager: [phone] Cell: [phone] If no response, call [phone]

## 2018-08-29 NOTE — Progress Notes (Signed)
INFECTIOUS DISEASE PROGRESS NOTE  ID: Julie Williams is a 53 y.o. female with  Principal Problem:   Polymicrobial bacterial infection Active Problems:   ARDS (adult respiratory distress syndrome) (HCC)   Acute respiratory failure with hypoxemia (HCC)   Stercoral ulcer of rectum   Rectal perforation (HCC)   Peritonitis (HCC)   Endotracheal tube present  Subjective: Awakens on exam  Abtx:  Anti-infectives (From admission, onward)   Start     Dose/Rate Route Frequency Ordered Stop   08/28/18 2000  DAPTOmycin (CUBICIN) 640 mg in sodium chloride 0.9 % IVPB     640 mg 225.6 mL/hr over 30 Minutes Intravenous Every 48 hours 08/28/18 1330     08/28/18 1400  meropenem (MERREM) 1 g in sodium chloride 0.9 % 100 mL IVPB     1 g 200 mL/hr over 30 Minutes Intravenous Every 12 hours 08/28/18 1330     08/26/18 1030  DAPTOmycin (CUBICIN) 618.5 mg in sodium chloride 0.9 % IVPB  Status:  Discontinued     8 mg/kg  77.3 kg 224.7 mL/hr over 30 Minutes Intravenous Every 48 hours 08/26/18 0934 08/26/18 0935   08/26/18 1030  DAPTOmycin (CUBICIN) 620 mg in sodium chloride 0.9 % IVPB  Status:  Discontinued     620 mg 224.8 mL/hr over 30 Minutes Intravenous Every 48 hours 08/26/18 0935 08/27/18 1038   08/25/18 0300  vancomycin (VANCOCIN) IVPB 1000 mg/200 mL premix  Status:  Discontinued     1,000 mg 200 mL/hr over 60 Minutes Intravenous  Once 08/24/18 0122 08/24/18 1049   08/24/18 0100  vancomycin (VANCOCIN) IVPB 1000 mg/200 mL premix  Status:  Discontinued     1,000 mg 200 mL/hr over 60 Minutes Intravenous Every 12 hours 08/24/18 0045 08/24/18 0122   08/24/18 0100  piperacillin-tazobactam (ZOSYN) IVPB 2.25 g  Status:  Discontinued     2.25 g 100 mL/hr over 30 Minutes Intravenous Every 6 hours 08/24/18 0045 08/28/18 1304      Medications:  Scheduled: . amiodarone  200 mg Oral BID  . chlorhexidine gluconate (MEDLINE KIT)  15 mL Mouth Rinse BID  . [START ON 08/30/2018] Chlorhexidine Gluconate  Cloth  6 each Topical Daily  . clonazePAM  1 mg Per Tube BID  . fentaNYL  75 mcg Transdermal Q72H  . heparin  5,000 Units Subcutaneous Q8H  . insulin aspart  0-9 Units Subcutaneous Q6H  . mouth rinse  15 mL Mouth Rinse 10 times per day  . metoprolol tartrate  25 mg Oral BID  . mupirocin ointment  1 application Nasal BID  . pantoprazole sodium  40 mg Per Tube Daily  . sodium chloride flush  10-40 mL Intracatheter Q12H  . sodium chloride flush  10-40 mL Intracatheter Q12H    Objective: Vital signs in last 24 hours: Temp:  [99 F (37.2 C)-102.3 F (39.1 C)] 99 F (37.2 C) (01/04 0900) Pulse Rate:  [80-114] 88 (01/04 0801) Resp:  [18-31] 22 (01/04 1000) BP: (93-158)/(60-99) 127/88 (01/04 1000) SpO2:  [94 %-100 %] 98 % (01/04 1000) FiO2 (%):  [40 %] 40 % (01/04 1000) Weight:  [77.8 kg] 77.8 kg (01/04 0418)   General appearance: no distress Resp: clear to auscultation bilaterally Cardio: regular rate and rhythm GI: abnormal findings:  hypoactive bowel sounds and ostomy tender.  Extremities: edema mild anasarca  Lab Results Recent Labs    08/27/18 1003 08/28/18 0420 08/29/18 0445  WBC 16.8* 15.0*  --   HGB 8.8* 8.0*  --  HCT 27.3* 24.2*  --   NA  --  141 141  K  --  3.2* 3.2*  CL  --  105 106  CO2  --  22 19*  BUN  --  70* 75*  CREATININE  --  5.47* 5.86*   Liver Panel Recent Labs    08/27/18 0415  PROT 5.7*  ALBUMIN 1.3*  AST 38  ALT 31  ALKPHOS 54  BILITOT 0.6   Sedimentation Rate No results for input(s): ESRSEDRATE in the last 72 hours. C-Reactive Protein No results for input(s): CRP in the last 72 hours.  Microbiology: Recent Results (from the past 240 hour(s))  Urine culture     Status: None   Collection Time: 08/23/18  5:36 PM  Result Value Ref Range Status   Specimen Description URINE, RANDOM  Final   Special Requests   Final    NONE Performed at [facility], [address], [place], [place] [address]    Culture NO GROWTH  Final    Report Status 08/24/2018 FINAL  Final  Culture, respiratory (tracheal aspirate)     Status: None   Collection Time: 08/23/18  5:36 PM  Result Value Ref Range Status   Specimen Description TRACHEAL ASPIRATE  Final   Special Requests NONE  Final   Gram Stain   Final    FEW WBC PRESENT,BOTH PMN AND MONONUCLEAR FEW YEAST WITH PSEUDOHYPHAE Performed at [facility], [place] [address] [place], [place] [address]    Culture FEW CANDIDA ALBICANS  Final   Report Status 08/25/2018 FINAL  Final  Culture, blood (routine x 2)     Status: None   Collection Time: 08/23/18  7:50 PM  Result Value Ref Range Status   Specimen Description BLOOD LEFT ANTECUBITAL  Final   Special Requests   Final    BOTTLES DRAWN AEROBIC AND ANAEROBIC Blood Culture adequate volume   Culture   Final    NO GROWTH 5 DAYS Performed at [facility], [place] [address] [place], [place] [address]    Report Status 08/28/2018 FINAL  Final  Culture, blood (routine x 2)     Status: None   Collection Time: 08/23/18 10:14 PM  Result Value Ref Range Status   Specimen Description BLOOD RIGHT ANTECUBITAL  Final   Special Requests   Final    BOTTLES DRAWN AEROBIC AND ANAEROBIC Blood Culture adequate volume   Culture   Final    NO GROWTH 5 DAYS Performed at [facility], [place] [address] [place], [place] [address]    Report Status 08/28/2018 FINAL  Final  Culture, blood (Routine X 2) w Reflex to ID Panel     Status: None (Preliminary result)   Collection Time: 08/26/18  1:20 PM  Result Value Ref Range Status   Specimen Description BLOOD BLOOD RIGHT HAND  Final   Special Requests AEROBIC BOTTLE ONLY Blood Culture adequate volume  Final   Culture NO GROWTH 3 DAYS  Final   Report Status PENDING  Incomplete  Culture, blood (Routine X 2) w Reflex to ID Panel     Status: None (Preliminary result)   Collection Time: 08/26/18  1:20 PM  Result Value Ref Range Status   Specimen Description BLOOD BLOOD LEFT HAND  Final    Special Requests AEROBIC BOTTLE ONLY Blood Culture adequate volume  Final   Culture NO GROWTH 3 DAYS  Final   Report Status PENDING  Incomplete  Culture, respiratory (non-expectorated)  Status: None   Collection Time: 08/26/18  1:33 PM  Result Value Ref Range Status   Specimen Description TRACHEAL ASPIRATE  Final   Special Requests NONE  Final   Gram Stain   Final    RARE WBC PRESENT, PREDOMINANTLY PMN RARE YEAST Performed at [facility], [place] [address] [place], [place] [address]    Culture FEW CANDIDA ALBICANS  Final   Report Status 08/29/2018 FINAL  Final  MRSA PCR Screening     Status: Abnormal   Collection Time: 08/28/18  8:21 AM  Result Value Ref Range Status   MRSA by PCR POSITIVE (A) NEGATIVE Final    Comment:        The GeneXpert MRSA Assay (FDA approved for NASAL specimens only), is one component of a comprehensive MRSA colonization surveillance program. It is not intended to diagnose MRSA infection nor to guide or monitor treatment for MRSA infections. RESULT CALLED TO, READ BACK BY AND VERIFIED WITH: Annette Stable RN 10:50 08/28/17 ([email or web address]) Performed at [facility], [place] [address], [place], [place] [address]     Studies/Results: [place] Ekg Site Rite  Result Date: 08/27/2018 If [facility] image not attached, placement could not be confirmed due to current cardiac rhythm.    Assessment/Plan: Rectal perforation Fusobacterium bacteremia Pneumonia  C albicans in trach aspirate AKI  Total days of antibiotics: 11 (dapto/merrem day 1)  She has no bowel sounds and mild distension. There was previously a question about the health of her stoma.  Grateful for surgical f/u.  Her last CT (1-1) showed no abscess, possible colitis, her CXR (1-2) was felt to be similar to prior.  Temp was up to 101.6 o/n. Better so far today Will continue to watch on her broadened coverage.  Hold diflucan for now.  Cr is slightly worse today, WBC pending.  She is on sq  heparin, will defer dopplers of LE for now.          Bobby Rumpf MD, FACP Infectious Diseases (pager) [phone] [email or web address] 08/29/2018, 10:53 AM  LOS: 6 days

## 2018-08-29 NOTE — Progress Notes (Signed)
CC:  Perforated colon/rectum  Subjective: On Vent, more awake today.  She moves away with almost any touch.  Wound is still soupy, now on wet to dry, Ostomy bag empty, but has been working well started on some TF.    Objective: Vital signs in last 24 hours: Temp:  [99.9 F (37.7 C)-102.3 F (39.1 C)] 100.5 F (38.1 C) (01/04 0516) Pulse Rate:  [80-114] 93 (01/04 0401) Resp:  [18-31] 19 (01/04 0700) BP: (93-158)/(60-99) 95/64 (01/04 0700) SpO2:  [94 %-100 %] 95 % (01/04 0700) FiO2 (%):  [40 %] 40 % (01/04 0401) Weight:  [77.8 kg] 77.8 kg (01/04 0418) Last BM Date: 08/29/18 361 FT @ 20 ml/hr 1300 IV Urine 2100 Drain 70 Stool 500 TM 101.6 WBC yesterday 15.0 Creatinine up to 5.86 MRSA positive Intake/Output from previous day: 01/03 0701 - 01/04 0700 In: 1623.9 [I.V.:495.8; NG/GT:621.2; IV POIPPGFQM:210] Out: 2670 [Urine:2100; Drains:70; Stool:500] Intake/Output this shift: No intake/output data recorded.  General appearance: alert and on the Vent GI: she is tender to any touch, but I think this is her baseline.  she has a functional ostomy, drain is empty but has been serous drainage.    Lab Results:  Recent Labs    08/27/18 1003 08/28/18 0420  WBC 16.8* 15.0*  HGB 8.8* 8.0*  HCT 27.3* 24.2*  PLT 401* 403*    BMET Recent Labs    08/28/18 0420 08/29/18 0445  NA 141 141  K 3.2* 3.2*  CL 105 106  CO2 22 19*  GLUCOSE 110* 94  BUN 70* 75*  CREATININE 5.47* 5.86*  CALCIUM 7.5* 7.5*   PT/INR No results for input(s): LABPROT, INR in the last 72 hours.  Recent Labs  Lab 08/23/18 1834 08/24/18 0240 08/27/18 0415  AST 135* 123* 38  ALT 89* 81* 31  ALKPHOS 43 48 54  BILITOT 1.0 1.2 0.6  PROT 5.3* 5.3* 5.7*  ALBUMIN 1.6* 1.7* 1.3*     Lipase     Component Value Date/Time   LIPASE 87 (H) 08/23/2018 1834     Medications: . amiodarone  200 mg Oral BID  . chlorhexidine gluconate (MEDLINE KIT)  15 mL Mouth Rinse BID  . [START ON 08/30/2018]  Chlorhexidine Gluconate Cloth  6 each Topical Daily  . clonazePAM  1 mg Per Tube BID  . fentaNYL  75 mcg Transdermal Q72H  . heparin  5,000 Units Subcutaneous Q8H  . insulin aspart  0-9 Units Subcutaneous Q6H  . mouth rinse  15 mL Mouth Rinse 10 times per day  . metoprolol tartrate  25 mg Oral BID  . mupirocin ointment  1 application Nasal BID  . pantoprazole sodium  40 mg Per Tube Daily  . sodium chloride flush  10-40 mL Intracatheter Q12H  . sodium chloride flush  10-40 mL Intracatheter Q12H   . sodium chloride Stopped (08/29/18 0626)  . DAPTOmycin (CUBICIN)  IV 226 mL/hr at 08/28/18 1800  . feeding supplement (VITAL AF 1.2 CAL) 20 mL/hr at 08/29/18 0700  . fentaNYL infusion INTRAVENOUS 100 mcg/hr (08/29/18 0700)  . meropenem (MERREM) IV 1 g (08/28/18 2105)  . potassium chloride 10 mEq (08/29/18 0626)   Anti-infectives (From admission, onward)   Start     Dose/Rate Route Frequency Ordered Stop   08/28/18 2000  DAPTOmycin (CUBICIN) 640 mg in sodium chloride 0.9 % IVPB     640 mg 225.6 mL/hr over 30 Minutes Intravenous Every 48 hours 08/28/18 1330     08/28/18 1400  meropenem (MERREM) 1 g in sodium chloride 0.9 % 100 mL IVPB     1 g 200 mL/hr over 30 Minutes Intravenous Every 12 hours 08/28/18 1330     08/26/18 1030  DAPTOmycin (CUBICIN) 618.5 mg in sodium chloride 0.9 % IVPB  Status:  Discontinued     8 mg/kg  77.3 kg 224.7 mL/hr over 30 Minutes Intravenous Every 48 hours 08/26/18 0934 08/26/18 0935   08/26/18 1030  DAPTOmycin (CUBICIN) 620 mg in sodium chloride 0.9 % IVPB  Status:  Discontinued     620 mg 224.8 mL/hr over 30 Minutes Intravenous Every 48 hours 08/26/18 0935 08/27/18 1038   08/25/18 0300  vancomycin (VANCOCIN) IVPB 1000 mg/200 mL premix  Status:  Discontinued     1,000 mg 200 mL/hr over 60 Minutes Intravenous  Once 08/24/18 0122 08/24/18 1049   08/24/18 0100  vancomycin (VANCOCIN) IVPB 1000 mg/200 mL premix  Status:  Discontinued     1,000 mg 200 mL/hr over 60  Minutes Intravenous Every 12 hours 08/24/18 0045 08/24/18 0122   08/24/18 0100  piperacillin-tazobactam (ZOSYN) IVPB 2.25 g  Status:  Discontinued     2.25 g 100 mL/hr over 30 Minutes Intravenous Every 6 hours 08/24/18 0045 08/28/18 1304      Assessment/Plan Enterococcal bacteremia -ID following HTN GERD Depression Alcohol abuse Chronic pain/narcotic dependence - On Morphine at home Former smoker VDRF/ARDS - aspiration pneumonia -CCM  Acute renal failure -creatinine 4.64 >>5.86 Anemia -H/H: 8.0/25.9   Perforated colon/rectum S/p exploratory laparotomy, resection distal sigmoid colon and proximal rectum, diverting colostomy, wound vac placement 12/25 Dr. Noberto Retort - POD10 -surgical path?  -wound vac changed to wet to dry 08/28/18 - continue JP drain and monitor output - no output from colostomy yet  ID -vancomycin 12/30>>12/30; zosyn 12/30>> 08/28/18,  daptomycin 1/1 >> day 3 Meropenem 1/3>> day 2 VTE -SCDs, sq heparin FEN -IVF, NPO/OGT, TPN Foley -in place    Plan:  Fever is worrisome, but she had last Abd CT 08/26/18, with no new significant findings related to her abdomen.  Will continue current Rx.    LOS: 6 days    Shneur [place] 08/29/2018 [phone]

## 2018-08-30 ENCOUNTER — Inpatient Hospital Stay (HOSPITAL_COMMUNITY): Payer: PPO

## 2018-08-30 DIAGNOSIS — K567 Ileus, unspecified: Secondary | ICD-10-CM

## 2018-08-30 DIAGNOSIS — Z95828 Presence of other vascular implants and grafts: Secondary | ICD-10-CM

## 2018-08-30 DIAGNOSIS — Z9289 Personal history of other medical treatment: Secondary | ICD-10-CM

## 2018-08-30 DIAGNOSIS — R451 Restlessness and agitation: Secondary | ICD-10-CM

## 2018-08-30 LAB — CBC WITH DIFFERENTIAL/PLATELET
Abs Immature Granulocytes: 0.44 10*3/uL — ABNORMAL HIGH (ref 0.00–0.07)
Basophils Absolute: 0 10*3/uL (ref 0.0–0.1)
Basophils Relative: 0 %
Eosinophils Absolute: 0.1 10*3/uL (ref 0.0–0.5)
Eosinophils Relative: 1 %
HCT: 22.8 % — ABNORMAL LOW (ref 36.0–46.0)
Hemoglobin: 7.3 g/dL — ABNORMAL LOW (ref 12.0–15.0)
Immature Granulocytes: 4 %
Lymphocytes Relative: 11 %
Lymphs Abs: 1.2 10*3/uL (ref 0.7–4.0)
MCH: 30.4 pg (ref 26.0–34.0)
MCHC: 32 g/dL (ref 30.0–36.0)
MCV: 95 fL (ref 80.0–100.0)
Monocytes Absolute: 0.3 10*3/uL (ref 0.1–1.0)
Monocytes Relative: 3 %
NEUTROS PCT: 81 %
Neutro Abs: 9 10*3/uL — ABNORMAL HIGH (ref 1.7–7.7)
Platelets: 438 10*3/uL — ABNORMAL HIGH (ref 150–400)
RBC: 2.4 MIL/uL — ABNORMAL LOW (ref 3.87–5.11)
RDW: 15.5 % (ref 11.5–15.5)
Smear Review: ADEQUATE
WBC: 11.1 10*3/uL — ABNORMAL HIGH (ref 4.0–10.5)
nRBC: 0.2 % (ref 0.0–0.2)

## 2018-08-30 LAB — GLUCOSE, CAPILLARY
GLUCOSE-CAPILLARY: 101 mg/dL — AB (ref 70–99)
GLUCOSE-CAPILLARY: 102 mg/dL — AB (ref 70–99)
Glucose-Capillary: 106 mg/dL — ABNORMAL HIGH (ref 70–99)
Glucose-Capillary: 99 mg/dL (ref 70–99)
Glucose-Capillary: 120 mg/dL — ABNORMAL HIGH (ref 70–99)
Glucose-Capillary: 109 mg/dL — ABNORMAL HIGH (ref 70–99)
Glucose-Capillary: 104 mg/dL — ABNORMAL HIGH (ref 70–99)

## 2018-08-30 LAB — BASIC METABOLIC PANEL
ANION GAP: 13 (ref 5–15)
BUN: 75 mg/dL — ABNORMAL HIGH (ref 6–20)
CALCIUM: 7.5 mg/dL — AB (ref 8.9–10.3)
CO2: 18 mmol/L — AB (ref 22–32)
Chloride: 111 mmol/L (ref 98–111)
Creatinine, Ser: 5.73 mg/dL — ABNORMAL HIGH (ref 0.44–1.00)
GFR calc Af Amer: 9 mL/min — ABNORMAL LOW (ref >60)
GFR calc non Af Amer: 8 mL/min — ABNORMAL LOW (ref >60)
Glucose, Bld: 122 mg/dL — ABNORMAL HIGH (ref 70–99)
Potassium: 3 mmol/L — ABNORMAL LOW (ref 3.5–5.1)
Sodium: 142 mmol/L (ref 135–145)

## 2018-08-30 LAB — MAGNESIUM: Magnesium: 2.2 mg/dL (ref 1.7–2.4)

## 2018-08-30 MED ORDER — POTASSIUM CHLORIDE 10 MEQ/50ML IV SOLN
10.0000 meq | INTRAVENOUS | Status: AC
  Administered 2018-08-30 (×3): 10 meq via INTRAVENOUS
  Filled 2018-08-30 (×3): qty 50

## 2018-08-30 MED ORDER — INSULIN ASPART 100 UNIT/ML ~~LOC~~ SOLN: 0.0000 [IU] | SUBCUTANEOUS | Status: DC

## 2018-08-30 MED ORDER — QUETIAPINE FUMARATE 50 MG PO TABS
50.0000 mg | ORAL_TABLET | Freq: Every day | ORAL | Status: DC
  Administered 2018-08-30 – 2018-08-31 (×2): 50 mg via ORAL
  Filled 2018-08-30 (×2): qty 1

## 2018-08-30 MED ORDER — LINEZOLID 600 MG/300ML IV SOLN
600.0000 mg | Freq: Two times a day (BID) | INTRAVENOUS | Status: DC
  Administered 2018-08-30 – 2018-09-04 (×11): 600 mg via INTRAVENOUS
  Filled 2018-08-30 (×11): qty 300

## 2018-08-30 NOTE — Progress Notes (Signed)
[facility]    NEPHROLOGY PROGRESS NOTE  SUBJECTIVE: Sedated, unable to provide review of systems.  Fever noted.  Urine output has stabilized.  OBJECTIVE:  Vitals:   08/30/18 1200 08/30/18 1610  BP: 114/79 122/80  Pulse:  (!) 102  Resp: (!) 21 (!) 24  Temp: (!) 100.4 F (38 C)   SpO2: 100% 98%   I/O last 3 completed shifts: In: 2879.3 [I.V.:913.2; NG/GT:1423.2; IV Piggyback:542.9] Out: 0998 [Urine:2425; Emesis/NG output:75; Drains:20; PJASN:0539]   Gen[place]l:  sedated NAD HEENT: MMM [place] AT anicteric sclera Neck:  No JVD, no adenopathy CV:  Heart RRR  Lungs:  L/S coarse bilaterally. Abd:  abd mildly distended GU:  Bladder non-palpable Extremities: (+)1 bilateral LE edema. Skin:  No skin rash  MEDICATIONS:   Current Facility-Administered Medications:  .  0.9 %  sodium chloride infusion, , Intravenous, PRN, Simonne Maffucci B, MD, Stopped at 08/30/18 1017 .  acetaminophen (TYLENOL) solution 650 mg, 650 mg, Per Tube, Q6H PRN, Simonne Maffucci B, MD, 650 mg at 08/29/18 1607 .  amiodarone (PACERONE) tablet 200 mg, 200 mg, Oral, BID, Velna Ochs, MD, 200 mg at 08/30/18 1017 .  chlorhexidine gluconate (MEDLINE KIT) (PERIDEX) 0.12 % solution 15 mL, 15 mL, Mouth Rinse, BID, Rush Farmer, MD, 15 mL at 08/30/18 0750 .  Chlorhexidine Gluconate Cloth 2 % PADS 6 each, 6 each, Topical, Daily, Simonne Maffucci B, MD, 6 each at 08/30/18 0230 .  clonazePAM (KLONOPIN) tablet 1 mg, 1 mg, Per Tube, BID, Velna Ochs, MD, 1 mg at 08/30/18 1017 .  feeding supplement (VITAL AF 1.2 CAL) liquid 1,000 mL, 1,000 mL, Per Tube, Continuous, McQuaid, Douglas B, MD, Last Rate: 70 mL/hr at 08/30/18 1649, 1,000 mL at 08/30/18 1649 .  fentaNYL (DURAGESIC - dosed mcg/hr) 75 mcg, 75 mcg, Transdermal, Q72H, Simonne Maffucci B, MD, 75 mcg at 08/28/18 1116 .  fentaNYL (SUBLIMAZE) injection 100 mcg, 100 mcg, Intravenous, Q2H PRN, Simonne Maffucci B, MD, 100 mcg at 08/30/18 0422 .   fentaNYL 2511mg in NS 2568m(1023mml) infusion-PREMIX, 25-100 mcg/hr, Intravenous, Continuous, McQuaid, Douglas B, MD, Last Rate: 10 mL/hr at 08/30/18 1100, 100 mcg/hr at 08/30/18 1100 .  heparin injection 5,000 Units, 5,000 Units, Subcutaneous, Q8H, YacRush FarmerD, 5,000 Units at 08/30/18 1630 .  insulin aspart (novoLOG) injection 0-9 Units, 0-9 Units, Subcutaneous, Q6H, Batchelder, NatCecilio AsperPH .  linezolid (ZYVOX) IVPB 600 mg, 600 mg, Intravenous, Q12H, HatCampbell RichesD, Last Rate: 300 mL/hr at 08/30/18 1154, 600 mg at 08/30/18 1154 .  MEDLINE mouth rinse, 15 mL, Mouth Rinse, 10 times per day, YacRush FarmerD, 15 mL at 08/30/18 1632 .  meropenem (MERREM) 1 g in sodium chloride 0.9 % 100 mL IVPB, 1 g, Intravenous, Q12H, CamMichel BickersD, Last Rate: 200 mL/hr at 08/30/18 1100 .  metoprolol tartrate (LOPRESSOR) injection 5 mg, 5 mg, Intravenous, Q6H PRN, GuiVelna OchsD .  metoprolol tartrate (LOPRESSOR) tablet 25 mg, 25 mg, Oral, BID, GuiVelna OchsD, 25 mg at 08/30/18 1017 .  midazolam (VERSED) injection 2 mg, 2 mg, Intravenous, Q2H PRN, McQSimonne Maffucci MD, 2 mg at 08/30/18 0606 .  mupirocin ointment (BACTROBAN) 2 % 1 application, 1 application, Nasal, BID, McQJuanito DoomD, 1 application at 01/76/73/4129 .  pantoprazole sodium (PROTONIX) 40 mg/20 mL oral suspension 40 mg, 40 mg, Per Tube, Daily, Masters, AliJake ChurchPH, 40 mg at 08/29/18 1926 .  propofol (DIPRIVAN) 1000 MG/100ML infusion, 5-80 mcg/kg/min, Intravenous, Titrated,  Juanito Doom, MD, Last Rate: 9.34 mL/hr at 08/30/18 1155, 20 mcg/kg/min at 08/30/18 1155 .  QUEtiapine (SEROQUEL) tablet 50 mg, 50 mg, Oral, QHS, Ledell Noss, MD .  sodium chloride flush (NS) 0.9 % injection 10-40 mL, 10-40 mL, Intracatheter, Q12H, McQuaid, Douglas B, MD, 10 mL at 08/29/18 2141 .  sodium chloride flush (NS) 0.9 % injection 10-40 mL, 10-40 mL, Intracatheter, PRN, McQuaid, Douglas B, MD .  sodium chloride flush  (NS) 0.9 % injection 10-40 mL, 10-40 mL, Intracatheter, Q12H, McQuaid, Douglas B, MD, 20 mL at 08/29/18 2141 .  sodium chloride flush (NS) 0.9 % injection 10-40 mL, 10-40 mL, Intracatheter, PRN, Juanito Doom, MD     LABS:  CBC Latest Ref Rng & Units 08/30/2018 08/29/2018 08/28/2018  WBC 4.0 - 10.5 K/uL 11.1(H) 11.1(H) 15.0(H)  Hemoglobin 12.0 - 15.0 g/dL 7.3(L) 7.2(L) 8.0(L)  Hematocrit 36.0 - 46.0 % 22.8(L) 22.1(L) 24.2(L)  Platelets 150 - 400 K/uL 438(H) 383 403(H)    CMP Latest Ref Rng & Units 08/30/2018 08/29/2018 08/28/2018  Glucose 70 - 99 mg/dL 122(H) 94 110(H)  BUN 6 - 20 mg/dL 75(H) 75(H) 70(H)  Creatinine 0.44 - 1.00 mg/dL 5.73(H) 5.86(H) 5.47(H)  Sodium 135 - 145 mmol/L 142 141 141  Potassium 3.5 - 5.1 mmol/L 3.0(L) 3.2(L) 3.2(L)  Chloride 98 - 111 mmol/L 111 106 105  CO2 22 - 32 mmol/L 18(L) 19(L) 22  Calcium 8.9 - 10.3 mg/dL 7.5(L) 7.5(L) 7.5(L)  Total Protein 6.5 - 8.1 g/dL - - -  Total Bilirubin 0.3 - 1.2 mg/dL - - -  Alkaline Phos 38 - 126 U/L - - -  AST 15 - 41 U/L - - -  ALT 0 - 44 U/L - - -    Lab Results  Component Value Date   CALCIUM 7.5 (L) 08/30/2018   CAION 1.19 08/30/2008   PHOS 4.5 08/27/2018       Component Value Date/Time   COLORURINE YELLOW 08/26/2018 1255   APPEARANCEUR HAZY (A) 08/26/2018 1255   LABSPEC 1.011 08/26/2018 1255   PHURINE 5.0 08/26/2018 1255   GLUCOSEU NEGATIVE 08/26/2018 1255   HGBUR MODERATE (A) 08/26/2018 1255   BILIRUBINUR NEGATIVE 08/26/2018 [place] 08/26/2018 1255   PROTEINUR 30 (A) 08/26/2018 1255   NITRITE NEGATIVE 08/26/2018 1255   LEUKOCYTESUR NEGATIVE 08/26/2018 1255      Component Value Date/Time   PHART 7.443 08/24/2018 0256   PCO2ART 30.6 (L) 08/24/2018 0256   PO2ART 91.0 08/24/2018 0256   HCO3 20.9 08/24/2018 0256   TCO2 22 08/24/2018 0256   ACIDBASEDEF 3.0 (H) 08/24/2018 0256   O2SAT 98.0 08/24/2018 0256       Component Value Date/Time   FERRITIN 235 07/13/2010 2032        ASSESSMENT/PLAN:     Problem List Items Addressed This Visit      Respiratory   ARDS (adult respiratory distress syndrome) ([place])   Relevant Orders   DG Chest Port 1 View (Completed)   Acute respiratory failure with hypoxemia (HCC)   Relevant Orders   DG Chest Port 1 View (Completed)   DG Chest Port 1 View (Completed)   DG Chest Port 1 View (Completed)     Digestive   Ileus ([place])   Relevant Orders   DG Abd 1 View (Completed)     Other   Endotracheal tube present   Relevant Orders   DG Chest 1 View (Completed)   History of ETT   Relevant Orders  DG Chest 1 View (Completed)   DG CHEST PORT 1 VIEW (Completed)       1.  Baseline creatinine 0.89 10/2015 2.  AKI.  Likely due to ATN in the setting of vanco/zosyn, shock, and MOSF. Non-oliguric with good urine output, and no evidence of obstruction from CT on 08/26/18.  No need for HD.  Would continue supportive care.  We will hold off on diuresis today in the setting of increased fever, heart rate, and worsening pneumonia. 3.  ARDS. Continue vent management.    Oxygenation is stable.  Continue negative fluid balance. 4.  Acute Encephalopathy.  Minimize sedation. 5.  Peritonitis.    Antibiotics per ID. Monitor fevers    [employer], DO, FACP

## 2018-08-30 NOTE — Progress Notes (Addendum)
CC:  Perforated colon/rectum  Subjective: Ongoing fever, pneumonia is worse on film.  Midline is showing more fat necrosis at the base, but is still intact. Ostomy working and she is on full tube feeds now.   Objective: Vital signs in last 24 hours: Temp:  [98.5 F (36.9 C)-102.9 F (39.4 C)] 98.5 F (36.9 C) (01/05 0341) Pulse Rate:  [79-105] 92 (01/05 0400) Resp:  [17-32] 23 (01/05 0700) BP: (98-149)/(61-131) 111/71 (01/05 0700) SpO2:  [92 %-99 %] 98 % (01/05 0700) FiO2 (%):  [40 %] 40 % (01/05 0400) Weight:  [76.1 kg-77.8 kg] 76.1 kg (01/05 0230) Last BM Date: 08/30/18 900 IV 1032 Tube feedings (TF up to 70/hr) 1150 urine Drain 10 Ostomy 575 Tm 102 again last PM , she also had fever 08/28/18 BP/HR stable Sats good on 40% FiO2/Vent K+ 3.0 -CCM replacing Creatinine 5.73 CXR 1/5:  Persistent bilateral bronchopneumonia. Worsening infiltrate in the right lung.  Intake/Output from previous day: 01/04 0701 - 01/05 0700 In: 1955.9 [I.V.:607.4; NG/GT:1032; IV Piggyback:316.5] Out: 1810 [Urine:1150; Emesis/NG output:75; Drains:10; Stool:575] Intake/Output this shift: Total I/O In: -  Out: 175 [Urine:175]  General appearance: alert and sedated on the Vent. Resp: full Vent support GI: Open wound shows more necrosis at the base.  Good bowel function via the ostomy.    Lab Results:  Recent Labs    08/29/18 1101 08/30/18 0425  WBC 11.1* 11.1*  HGB 7.2* 7.3*  HCT 22.1* 22.8*  PLT 383 438*    BMET Recent Labs    08/29/18 0445 08/30/18 0425  NA 141 142  K 3.2* 3.0*  CL 106 111  CO2 19* 18*  GLUCOSE 94 122*  BUN 75* 75*  CREATININE 5.86* 5.73*  CALCIUM 7.5* 7.5*   PT/INR No results for input(s): LABPROT, INR in the last 72 hours.  Recent Labs  Lab 08/23/18 1834 08/24/18 0240 08/27/18 0415  AST 135* 123* 38  ALT 89* 81* 31  ALKPHOS 43 48 54  BILITOT 1.0 1.2 0.6  PROT 5.3* 5.3* 5.7*  ALBUMIN 1.6* 1.7* 1.3*     Lipase     Component Value  Date/Time   LIPASE 87 (H) 08/23/2018 1834     Medications: . amiodarone  200 mg Oral BID  . chlorhexidine gluconate (MEDLINE KIT)  15 mL Mouth Rinse BID  . Chlorhexidine Gluconate Cloth  6 each Topical Daily  . clonazePAM  1 mg Per Tube BID  . fentaNYL  75 mcg Transdermal Q72H  . heparin  5,000 Units Subcutaneous Q8H  . insulin aspart  0-9 Units Subcutaneous Q6H  . mouth rinse  15 mL Mouth Rinse 10 times per day  . metoprolol tartrate  25 mg Oral BID  . mupirocin ointment  1 application Nasal BID  . pantoprazole sodium  40 mg Per Tube Daily  . sodium chloride flush  10-40 mL Intracatheter Q12H  . sodium chloride flush  10-40 mL Intracatheter Q12H   . sodium chloride Stopped (08/30/18 0615)  . DAPTOmycin (CUBICIN)  IV 226 mL/hr at 08/28/18 1800  . feeding supplement (VITAL AF 1.2 CAL) 70 mL/hr at 08/30/18 0700  . fentaNYL infusion INTRAVENOUS 75 mcg/hr (08/30/18 0738)  . meropenem (MERREM) IV 1 g (08/29/18 2142)  . potassium chloride 10 mEq (08/30/18 0734)  . propofol (DIPRIVAN) infusion 10 mcg/kg/min (08/30/18 0738)   Anti-infectives (From admission, onward)   Start     Dose/Rate Route Frequency Ordered Stop   08/28/18 2000  DAPTOmycin (CUBICIN) 640 mg  in sodium chloride 0.9 % IVPB     640 mg 225.6 mL/hr over 30 Minutes Intravenous Every 48 hours 08/28/18 1330     08/28/18 1400  meropenem (MERREM) 1 g in sodium chloride 0.9 % 100 mL IVPB     1 g 200 mL/hr over 30 Minutes Intravenous Every 12 hours 08/28/18 1330     08/26/18 1030  DAPTOmycin (CUBICIN) 618.5 mg in sodium chloride 0.9 % IVPB  Status:  Discontinued     8 mg/kg  77.3 kg 224.7 mL/hr over 30 Minutes Intravenous Every 48 hours 08/26/18 0934 08/26/18 0935   08/26/18 1030  DAPTOmycin (CUBICIN) 620 mg in sodium chloride 0.9 % IVPB  Status:  Discontinued     620 mg 224.8 mL/hr over 30 Minutes Intravenous Every 48 hours 08/26/18 0935 08/27/18 1038   08/25/18 0300  vancomycin (VANCOCIN) IVPB 1000 mg/200 mL premix  Status:   Discontinued     1,000 mg 200 mL/hr over 60 Minutes Intravenous  Once 08/24/18 0122 08/24/18 1049   08/24/18 0100  vancomycin (VANCOCIN) IVPB 1000 mg/200 mL premix  Status:  Discontinued     1,000 mg 200 mL/hr over 60 Minutes Intravenous Every 12 hours 08/24/18 0045 08/24/18 0122   08/24/18 0100  piperacillin-tazobactam (ZOSYN) IVPB 2.25 g  Status:  Discontinued     2.25 g 100 mL/hr over 30 Minutes Intravenous Every 6 hours 08/24/18 0045 08/28/18 1304     Assessment/Plan  Enterococcal bacteremia-ID following HTN GERD Depression Alcohol abuse Chronic pain/narcotic dependence -On Morphine at home Former smoker VDRF/ARDS- aspiration pneumonia -CCM Acute renal failure-creatinine 4.64 >>5.86>>5.73 Anemia-H/H:8.0/25.9(12/31)>>7.3/22.8(today) Hypokalemia - being replaced: Mag pending  Perforated colon/rectum S/p exploratory laparotomy, resection distal sigmoid colon and proximal rectum, diverting colostomy, wound vac placement 12/25 Dr. Noberto Retort - POD10 -surgical path?  -wound vac changed to wet to dry 08/28/18 - continue JP drain and monitor output - no output from colostomy yet  ID -vancomycin 12/30>>12/30;zosyn 12/30>>08/28/18,daptomycin 1/1 >> day 5 Meropenem 1/3>> day 3 VTE -SCDs, sq heparin FEN -IVF, NPO/OGT, TPN Foley -in place  Plan:  Continue wet to dry dressing, CCM support, with abx, Vent and TF.    LOS: 7 days    Caycee Wanat 08/30/2018 [phone]
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
## 2018-08-30 NOTE — Progress Notes (Signed)
[facility] Progress Note Patient Name: Julie Williams DOB: 12/17/1965 MRN: [id number]   Date of Service  08/30/2018  HPI/Events of Note  K+ = 3.0 and Creatinine = 5.73.   eICU Interventions  Will cautiously replace K+.      Intervention Category Major Interventions: Electrolyte abnormality - evaluation and management  Sommer,Steven Eugene 08/30/2018, 6:08 AM

## 2018-08-30 NOTE — Progress Notes (Signed)
Fentanyl and propofol drips on hold d/t patient being hypotensive, 85/57 (68). Will continue to monitor patient closely.

## 2018-08-30 NOTE — Progress Notes (Signed)
INFECTIOUS DISEASE PROGRESS NOTE  ID: Julie Williams is a 53 y.o. female with  Principal Problem:   Polymicrobial bacterial infection Active Problems:   ARDS (adult respiratory distress syndrome) (HCC)   Acute respiratory failure with hypoxemia (HCC)   Stercoral ulcer of rectum   Rectal perforation (HCC)   Peritonitis (HCC)   Endotracheal tube present   History of ETT   Ileus (HCC)  Subjective: On vent, agitated.   Abtx:  Anti-infectives (From admission, onward)   Start     Dose/Rate Route Frequency Ordered Stop   08/28/18 2000  DAPTOmycin (CUBICIN) 640 mg in sodium chloride 0.9 % IVPB     640 mg 225.6 mL/hr over 30 Minutes Intravenous Every 48 hours 08/28/18 1330     08/28/18 1400  meropenem (MERREM) 1 g in sodium chloride 0.9 % 100 mL IVPB     1 g 200 mL/hr over 30 Minutes Intravenous Every 12 hours 08/28/18 1330     08/26/18 1030  DAPTOmycin (CUBICIN) 618.5 mg in sodium chloride 0.9 % IVPB  Status:  Discontinued     8 mg/kg  77.3 kg 224.7 mL/hr over 30 Minutes Intravenous Every 48 hours 08/26/18 0934 08/26/18 0935   08/26/18 1030  DAPTOmycin (CUBICIN) 620 mg in sodium chloride 0.9 % IVPB  Status:  Discontinued     620 mg 224.8 mL/hr over 30 Minutes Intravenous Every 48 hours 08/26/18 0935 08/27/18 1038   08/25/18 0300  vancomycin (VANCOCIN) IVPB 1000 mg/200 mL premix  Status:  Discontinued     1,000 mg 200 mL/hr over 60 Minutes Intravenous  Once 08/24/18 0122 08/24/18 1049   08/24/18 0100  vancomycin (VANCOCIN) IVPB 1000 mg/200 mL premix  Status:  Discontinued     1,000 mg 200 mL/hr over 60 Minutes Intravenous Every 12 hours 08/24/18 0045 08/24/18 0122   08/24/18 0100  piperacillin-tazobactam (ZOSYN) IVPB 2.25 g  Status:  Discontinued     2.25 g 100 mL/hr over 30 Minutes Intravenous Every 6 hours 08/24/18 0045 08/28/18 1304      Medications:  Scheduled: . amiodarone  200 mg Oral BID  . chlorhexidine gluconate (MEDLINE KIT)  15 mL Mouth Rinse BID  .  Chlorhexidine Gluconate Cloth  6 each Topical Daily  . clonazePAM  1 mg Per Tube BID  . fentaNYL  75 mcg Transdermal Q72H  . heparin  5,000 Units Subcutaneous Q8H  . insulin aspart  0-9 Units Subcutaneous Q6H  . mouth rinse  15 mL Mouth Rinse 10 times per day  . metoprolol tartrate  25 mg Oral BID  . mupirocin ointment  1 application Nasal BID  . pantoprazole sodium  40 mg Per Tube Daily  . sodium chloride flush  10-40 mL Intracatheter Q12H  . sodium chloride flush  10-40 mL Intracatheter Q12H    Objective: Vital signs in last 24 hours: Temp:  [98.5 F (36.9 C)-102.9 F (39.4 C)] 98.5 F (36.9 C) (01/05 0341) Pulse Rate:  [79-110] 110 (01/05 0910) Resp:  [17-32] 27 (01/05 0910) BP: (98-149)/(61-131) 133/86 (01/05 0910) SpO2:  [92 %-99 %] 96 % (01/05 0910) FiO2 (%):  [40 %] 40 % (01/05 0910) Weight:  [76.1 kg-77.8 kg] 76.1 kg (01/05 0230)   General appearance: alert and moderate distress Resp: rhonchi anterior - bilateral Cardio: tachycardia GI: abnormal findings:  hypoactive bowel sounds and tender stoma. midline wound partially undressed- clean inferiorly.  Extremities: edema mild anasarca. RUE PIC.   Lab Results Recent Labs    08/29/18 0445 08/29/18  1101 08/30/18 0425  WBC  --  11.1* 11.1*  HGB  --  7.2* 7.3*  HCT  --  22.1* 22.8*  NA 141  --  142  K 3.2*  --  3.0*  CL 106  --  111  CO2 19*  --  18*  BUN 75*  --  75*  CREATININE 5.86*  --  5.73*   Liver Panel No results for input(s): PROT, ALBUMIN, AST, ALT, ALKPHOS, BILITOT, BILIDIR, IBILI in the last 72 hours. Sedimentation Rate No results for input(s): ESRSEDRATE in the last 72 hours. C-Reactive Protein No results for input(s): CRP in the last 72 hours.  Microbiology: Recent Results (from the past 240 hour(s))  Urine culture     Status: None   Collection Time: 08/23/18  5:36 PM  Result Value Ref Range Status   Specimen Description URINE, RANDOM  Final   Special Requests   Final    NONE Performed at  [facility], [address], [place], [place] [address]    Culture NO GROWTH  Final   Report Status 08/24/2018 FINAL  Final  Culture, respiratory (tracheal aspirate)     Status: None   Collection Time: 08/23/18  5:36 PM  Result Value Ref Range Status   Specimen Description TRACHEAL ASPIRATE  Final   Special Requests NONE  Final   Gram Stain   Final    FEW WBC PRESENT,BOTH PMN AND MONONUCLEAR FEW YEAST WITH PSEUDOHYPHAE Performed at [facility], [place] [address], [place], [place] [address]    Culture FEW CANDIDA ALBICANS  Final   Report Status 08/25/2018 FINAL  Final  Culture, blood (routine x 2)     Status: None   Collection Time: 08/23/18  7:50 PM  Result Value Ref Range Status   Specimen Description BLOOD LEFT ANTECUBITAL  Final   Special Requests   Final    BOTTLES DRAWN AEROBIC AND ANAEROBIC Blood Culture adequate volume   Culture   Final    NO GROWTH 5 DAYS Performed at [facility], [place] [address] [place], [place] [address]    Report Status 08/28/2018 FINAL  Final  Culture, blood (routine x 2)     Status: None   Collection Time: 08/23/18 10:14 PM  Result Value Ref Range Status   Specimen Description BLOOD RIGHT ANTECUBITAL  Final   Special Requests   Final    BOTTLES DRAWN AEROBIC AND ANAEROBIC Blood Culture adequate volume   Culture   Final    NO GROWTH 5 DAYS Performed at [facility], [place] [address] [place], [place] [address]    Report Status 08/28/2018 FINAL  Final  Culture, blood (Routine X 2) w Reflex to ID Panel     Status: None (Preliminary result)   Collection Time: 08/26/18  1:20 PM  Result Value Ref Range Status   Specimen Description BLOOD BLOOD RIGHT HAND  Final   Special Requests AEROBIC BOTTLE ONLY Blood Culture adequate volume  Final   Culture NO GROWTH 4 DAYS  Final   Report Status PENDING  Incomplete  Culture, blood (Routine X 2) w Reflex to ID Panel     Status: None (Preliminary result)   Collection Time: 08/26/18  1:20  PM  Result Value Ref Range Status   Specimen Description BLOOD BLOOD LEFT HAND  Final   Special Requests AEROBIC BOTTLE ONLY Blood Culture adequate volume  Final   Culture NO GROWTH 4 DAYS  Final   Report  Status PENDING  Incomplete  Culture, respiratory (non-expectorated)     Status: None   Collection Time: 08/26/18  1:33 PM  Result Value Ref Range Status   Specimen Description TRACHEAL ASPIRATE  Final   Special Requests NONE  Final   Gram Stain   Final    RARE WBC PRESENT, PREDOMINANTLY PMN RARE YEAST Performed at [facility], [address] [place], [place] [address]    Culture FEW CANDIDA ALBICANS  Final   Report Status 08/29/2018 FINAL  Final  MRSA PCR Screening     Status: Abnormal   Collection Time: 08/28/18  8:21 AM  Result Value Ref Range Status   MRSA by PCR POSITIVE (A) NEGATIVE Final    Comment:        The GeneXpert MRSA Assay (FDA approved for NASAL specimens only), is one component of a comprehensive MRSA colonization surveillance program. It is not intended to diagnose MRSA infection nor to guide or monitor treatment for MRSA infections. RESULT CALLED TO, READ BACK BY AND VERIFIED WITH: Annette Stable RN 10:50 08/28/17 ([email or web address]) Performed at [facility], [place] [address], [place], [place] [address]     Studies/Results: Dg Chest Port 1 View  Result Date: 08/30/2018 CLINICAL DATA:  Followup ventilator support EXAM: PORTABLE CHEST 1 VIEW COMPARISON:  08/27/2018 FINDINGS: Endotracheal tube tip is 2 cm above the carina. Nasogastric tube enters the stomach. Right arm PICC tip is at the SVC RA junction. Patchy bilateral bronchopneumonia persists, similar to the study of 3 days ago. Infiltrates in the right lung have worsened compared to older films including 08/25/2018. IMPRESSION: Lines and tubes well positioned. Persistent bilateral bronchopneumonia. Worsening infiltrate in the right lung. Electronically Signed   By: Nelson Chimes M.D.   On: 08/30/2018 06:40      Assessment/Plan: Rectal perforation Fusobacterium bacteremia Pneumonia             C albicans in trach aspirate AKI  Total days of antibiotics: 12 (dapto/merrem day 2)  Discussed her stoma with CCM- appears to be healthy.  Her CXR is worsened and she has continued fever. Will change her dapto to zyvox.  Grateful for surgical f/u of her wounds.  Lines changed yesterday Her last CT (1-1) showed no abscess, possible colitis. Holding on repeat.  Hopefully Cr has plateaued.  Hold diflucan for now.         Bobby Rumpf MD, FACP Infectious Diseases (pager) [phone] [email or web address] 08/30/2018, 9:33 AM  LOS: 7 days

## 2018-08-30 NOTE — Progress Notes (Deleted)
Patient having difficulty swallowing coughing after chewing on meat. This same happened at lunch. O2 sats are 100% no resp distress noted. Will follow up in am with speech therapy. Patient daughter states that he eats at home on the vent without issues. Patient states that he will eat slower

## 2018-08-31 ENCOUNTER — Inpatient Hospital Stay (HOSPITAL_COMMUNITY): Payer: PPO

## 2018-08-31 DIAGNOSIS — D649 Anemia, unspecified: Secondary | ICD-10-CM

## 2018-08-31 DIAGNOSIS — Z8701 Personal history of pneumonia (recurrent): Secondary | ICD-10-CM

## 2018-08-31 DIAGNOSIS — N179 Acute kidney failure, unspecified: Secondary | ICD-10-CM

## 2018-08-31 DIAGNOSIS — I4891 Unspecified atrial fibrillation: Secondary | ICD-10-CM

## 2018-08-31 DIAGNOSIS — D72829 Elevated white blood cell count, unspecified: Secondary | ICD-10-CM

## 2018-08-31 DIAGNOSIS — R509 Fever, unspecified: Secondary | ICD-10-CM

## 2018-08-31 HISTORY — DX: Acute kidney failure, unspecified: N17.9

## 2018-08-31 LAB — GLUCOSE, CAPILLARY
Glucose-Capillary: 88 mg/dL (ref 70–99)
Glucose-Capillary: 99 mg/dL (ref 70–99)
Glucose-Capillary: 124 mg/dL — ABNORMAL HIGH (ref 70–99)
Glucose-Capillary: 113 mg/dL — ABNORMAL HIGH (ref 70–99)
Glucose-Capillary: 106 mg/dL — ABNORMAL HIGH (ref 70–99)
Glucose-Capillary: 111 mg/dL — ABNORMAL HIGH (ref 70–99)

## 2018-08-31 LAB — CBC WITH DIFFERENTIAL/PLATELET
Abs Immature Granulocytes: 0.1 10*3/uL — ABNORMAL HIGH (ref 0.00–0.07)
BASOS PCT: 3 %
Basophils Absolute: 0.4 10*3/uL — ABNORMAL HIGH (ref 0.0–0.1)
Eosinophils Absolute: 0.1 10*3/uL (ref 0.0–0.5)
Eosinophils Relative: 1 %
HEMATOCRIT: 21.3 % — AB (ref 36.0–46.0)
Hemoglobin: 6.8 g/dL — CL (ref 12.0–15.0)
Lymphocytes Relative: 12 %
Lymphs Abs: 1.7 10*3/uL (ref 0.7–4.0)
MCH: 32.1 pg (ref 26.0–34.0)
MCHC: 31.9 g/dL (ref 30.0–36.0)
MCV: 100.5 fL — ABNORMAL HIGH (ref 80.0–100.0)
Monocytes Absolute: 0.1 10*3/uL (ref 0.1–1.0)
Monocytes Relative: 1 %
Myelocytes: 1 %
Neutro Abs: 11.4 10*3/uL — ABNORMAL HIGH (ref 1.7–7.7)
Neutrophils Relative %: 82 %
Platelets: 406 10*3/uL — ABNORMAL HIGH (ref 150–400)
RBC: 2.12 MIL/uL — ABNORMAL LOW (ref 3.87–5.11)
RDW: 15.9 % — AB (ref 11.5–15.5)
WBC: 13.9 10*3/uL — ABNORMAL HIGH (ref 4.0–10.5)
nRBC: 0 /100 WBC
nRBC: 0.1 % (ref 0.0–0.2)

## 2018-08-31 LAB — MAGNESIUM: Magnesium: 2.1 mg/dL (ref 1.7–2.4)

## 2018-08-31 LAB — PREPARE RBC (CROSSMATCH)

## 2018-08-31 LAB — COMPREHENSIVE METABOLIC PANEL
ALT: 13 U/L (ref 0–44)
AST: 21 U/L (ref 15–41)
Albumin: 1.1 g/dL — ABNORMAL LOW (ref 3.5–5.0)
Alkaline Phosphatase: 60 U/L (ref 38–126)
Anion gap: 12 (ref 5–15)
BUN: 77 mg/dL — ABNORMAL HIGH (ref 6–20)
CO2: 16 mmol/L — ABNORMAL LOW (ref 22–32)
Calcium: 7 mg/dL — ABNORMAL LOW (ref 8.9–10.3)
Chloride: 112 mmol/L — ABNORMAL HIGH (ref 98–111)
Creatinine, Ser: 5.74 mg/dL — ABNORMAL HIGH (ref 0.44–1.00)
GFR calc Af Amer: 9 mL/min — ABNORMAL LOW (ref >60)
GFR calc non Af Amer: 8 mL/min — ABNORMAL LOW (ref >60)
Glucose, Bld: 108 mg/dL — ABNORMAL HIGH (ref 70–99)
Potassium: 3.4 mmol/L — ABNORMAL LOW (ref 3.5–5.1)
Sodium: 140 mmol/L (ref 135–145)
TOTAL PROTEIN: 5.9 g/dL — AB (ref 6.5–8.1)
Total Bilirubin: 0.7 mg/dL (ref 0.3–1.2)

## 2018-08-31 LAB — CULTURE, BLOOD (ROUTINE X 2)
Culture: NO GROWTH
Culture: NO GROWTH
Special Requests: ADEQUATE
Special Requests: ADEQUATE

## 2018-08-31 LAB — PROCALCITONIN: Procalcitonin: 15.27 ng/mL

## 2018-08-31 LAB — PATHOLOGIST SMEAR REVIEW

## 2018-08-31 LAB — ABO/RH: ABO/RH(D): B POS

## 2018-08-31 LAB — TRIGLYCERIDES: TRIGLYCERIDES: 583 mg/dL — AB (ref <150)

## 2018-08-31 MED ORDER — DEXMEDETOMIDINE HCL IN NACL 400 MCG/100ML IV SOLN
0.0000 ug/kg/h | INTRAVENOUS | Status: AC
  Administered 2018-08-31 (×2): 0.8 ug/kg/h via INTRAVENOUS
  Administered 2018-09-01: 0.4 ug/kg/h via INTRAVENOUS
  Administered 2018-09-01: 0.8 ug/kg/h via INTRAVENOUS
  Administered 2018-09-01: 1.2 ug/kg/h via INTRAVENOUS
  Administered 2018-09-02: 0.8 ug/kg/h via INTRAVENOUS
  Administered 2018-09-02: 1.2 ug/kg/h via INTRAVENOUS
  Administered 2018-09-02 (×2): 1 ug/kg/h via INTRAVENOUS
  Administered 2018-09-03: 0.8 ug/kg/h via INTRAVENOUS
  Filled 2018-08-31 (×13): qty 100

## 2018-08-31 MED ORDER — DILTIAZEM HCL 30 MG PO TABS
30.0000 mg | ORAL_TABLET | Freq: Four times a day (QID) | ORAL | Status: DC
  Administered 2018-08-31 – 2018-09-02 (×6): 30 mg via ORAL
  Filled 2018-08-31 (×9): qty 1

## 2018-08-31 MED ORDER — SODIUM CHLORIDE 0.9% IV SOLUTION: Freq: Once | INTRAVENOUS | Status: DC

## 2018-08-31 MED ORDER — DILTIAZEM HCL-DEXTROSE 100-5 MG/100ML-% IV SOLN (PREMIX)
5.0000 mg/h | INTRAVENOUS | Status: DC
  Administered 2018-08-31 (×2): 5 mg/h via INTRAVENOUS
  Filled 2018-08-31 (×2): qty 100

## 2018-08-31 NOTE — Progress Notes (Signed)
53 year old woman admitted to [facility] 12/24 with colonic perforation requiring colectomy, course complicated by aspiration pneumonia requiring mechanical ventilation 12/28 and bacteremia with fusobacterium and enterococcus, transferred to [facility] on 12/29 for management of ARDS. She has a history of chronic pain on opiates  She remains febrile, agitated in spite of propofol and fentanyl, rhythm irregular on monitor, atrial fibrillation, decreased breath sounds bilateral, mild secretions, soft abdomen with tenderness right lower quadrant, feces in colostomy bag, JP drain with serosanguineous   Chest x-ray personally reviewed which shows ET tube in position and bilateral patchy infiltrates unchanged from prior  Labs show mild hypokalemia, creatinine plateaued at 5.7, mild leukocytosis and slight high procalcitonin, hemoglobin dropped to 6.8.  Impression/plan   Persistent fever despite treatment for peritonitis and bacteremia with fusobacterium and enterococcus/aspiration pneumonia -continue meropenem and Zyvox, repeat blood cultures, follow GPC and respiratory culture, low threshold to add antifungal , last CT abdomen 1/1 does not show abdominal abscess Septic shock resolved  ARDS resolved Acute hypoxic respiratory failure-spontaneous breathing trials, currently limited by agitation  Acute encephalopathy/chronic opiate use -change to Precedex from propofol, continue Seroquel clonazepam and fentanyl patch infusion  AKI -creatinine seems to have plateaued at 5.7, nonoliguric, hopeful for renal recovery here now that shock is resolved  Atrial fibrillation/RVR-reverted to sinus rhythm, discontinue Cardizem continue amiodarone and low-dose metoprolol  Trying to contact son so that we can make decisions over the next few days in case tracheostomy required  My critical care time x 57m  Kara Mead MD. Shade Flood. [facility] Pager [phone] If no response call [phone]    08/31/2018

## 2018-08-31 NOTE — Progress Notes (Signed)
Fentanyl and propofol drips restarted. BP is now 109/68. Will continue to monitor.

## 2018-08-31 NOTE — Progress Notes (Signed)
[facility]    NEPHROLOGY PROGRESS NOTE  SUBJECTIVE: Sedated, unable to provide review of systems.  Fever resolved.  Urine output has stabilized.  OBJECTIVE:  Vitals:   08/31/18 1500 08/31/18 1523  BP:  (!) 101/57  Pulse:  88  Resp:  (!) 27  Temp: 98.5 F (36.9 C)   SpO2:  100%   I/O last 3 completed shifts: In: 9292 [I.V.:940.7; NG/GT:2307.2; IV Piggyback:1080.1] Out: 2770 [Urine:1850; Drains:20; Stool:900]   Genearl:  sedated NAD HEENT: MMM [place] AT anicteric sclera Neck:  No JVD, no adenopathy CV:  Heart RRR  Lungs:  L/S coarse bilaterally. Abd:  abd mildly distended GU:  Bladder non-palpable Extremities: (+)1 bilateral LE edema. Skin:  No skin rash  MEDICATIONS:   Current Facility-Administered Medications:  .  0.9 %  sodium chloride infusion (Manually program via Guardrails IV Fluids), , Intravenous, Once, Anders Simmonds, MD .  0.9 %  sodium chloride infusion, , Intravenous, PRN, Simonne Maffucci B, MD, Last Rate: 10 mL/hr at 08/31/18 0600 .  acetaminophen (TYLENOL) solution 650 mg, 650 mg, Per Tube, Q6H PRN, Juanito Doom, MD, 650 mg at 08/30/18 1951 .  amiodarone (PACERONE) tablet 200 mg, 200 mg, Oral, BID, Velna Ochs, MD, 200 mg at 08/31/18 0928 .  chlorhexidine gluconate (MEDLINE KIT) (PERIDEX) 0.12 % solution 15 mL, 15 mL, Mouth Rinse, BID, Rush Farmer, MD, 15 mL at 08/31/18 0835 .  Chlorhexidine Gluconate Cloth 2 % PADS 6 each, 6 each, Topical, Daily, Simonne Maffucci B, MD, 6 each at 08/30/18 2200 .  clonazePAM (KLONOPIN) tablet 1 mg, 1 mg, Per Tube, BID, Velna Ochs, MD, 1 mg at 08/31/18 0928 .  dexmedetomidine (PRECEDEX) 400 MCG/100ML (4 mcg/mL) infusion, 0-1.2 mcg/kg/hr, Intravenous, Continuous, Kara Mead V, MD, Last Rate: 19.63 mL/hr at 08/31/18 1100, 1 mcg/kg/hr at 08/31/18 1100 .  diltiazem (CARDIZEM) tablet 30 mg, 30 mg, Oral, Q6H, Ledell Noss, MD .  feeding supplement (VITAL AF 1.2 CAL) liquid 1,000 mL, 1,000 mL,  Per Tube, Continuous, McQuaid, Douglas B, MD, Last Rate: 70 mL/hr at 08/31/18 0600 .  fentaNYL (DURAGESIC - dosed mcg/hr) 75 mcg, 75 mcg, Transdermal, Q72H, Simonne Maffucci B, MD, 75 mcg at 08/31/18 1157 .  fentaNYL (SUBLIMAZE) injection 100 mcg, 100 mcg, Intravenous, Q2H PRN, Simonne Maffucci B, MD, 100 mcg at 08/31/18 1553 .  fentaNYL 2571mg in NS 2559m(102mml) infusion-PREMIX, 25-100 mcg/hr, Intravenous, Continuous, McQuaid, Douglas B, MD, Last Rate: 7 mL/hr at 08/31/18 1100, 70 mcg/hr at 08/31/18 1100 .  heparin injection 5,000 Units, 5,000 Units, Subcutaneous, Q8H, YacRush FarmerD, 5,000 Units at 08/31/18 1310 .  linezolid (ZYVOX) IVPB 600 mg, 600 mg, Intravenous, Q12H, HatCampbell RichesD, Stopped at 08/31/18 1026 .  MEDLINE mouth rinse, 15 mL, Mouth Rinse, 10 times per day, YacRush FarmerD, 15 mL at 08/31/18 1553 .  meropenem (MERREM) 1 g in sodium chloride 0.9 % 100 mL IVPB, 1 g, Intravenous, Q12H, CamMichel BickersD, Last Rate: 200 mL/hr at 08/31/18 1100 .  metoprolol tartrate (LOPRESSOR) injection 5 mg, 5 mg, Intravenous, Q6H PRN, GuiVelna OchsD, 5 mg at 08/31/18 0452 .  metoprolol tartrate (LOPRESSOR) tablet 25 mg, 25 mg, Oral, BID, GuiVelna OchsD, 25 mg at 08/31/18 [id number] midazolam (VERSED) injection 2 mg, 2 mg, Intravenous, Q2H PRN, McQSimonne Maffucci MD, 2 mg at 08/30/18 0606 .  pantoprazole sodium (PROTONIX) 40 mg/20 mL oral suspension 40 mg, 40 mg, Per Tube, Daily, Masters, AliJake Church  RPH, 40 mg at 08/30/18 1951 .  QUEtiapine (SEROQUEL) tablet 50 mg, 50 mg, Oral, QHS, Ledell Noss, MD, 50 mg at 08/30/18 2056 .  sodium chloride flush (NS) 0.9 % injection 10-40 mL, 10-40 mL, Intracatheter, Q12H, McQuaid, Douglas B, MD, 10 mL at 08/30/18 2100 .  sodium chloride flush (NS) 0.9 % injection 10-40 mL, 10-40 mL, Intracatheter, PRN, McQuaid, Douglas B, MD .  sodium chloride flush (NS) 0.9 % injection 10-40 mL, 10-40 mL, Intracatheter, Q12H, McQuaid, Douglas B, MD,  10 mL at 08/30/18 2100 .  sodium chloride flush (NS) 0.9 % injection 10-40 mL, 10-40 mL, Intracatheter, PRN, Juanito Doom, MD     LABS:  CBC Latest Ref Rng & Units 08/31/2018 08/30/2018 08/29/2018  WBC 4.0 - 10.5 K/uL 13.9(H) 11.1(H) 11.1(H)  Hemoglobin 12.0 - 15.0 g/dL 6.8(LL) 7.3(L) 7.2(L)  Hematocrit 36.0 - 46.0 % 21.3(L) 22.8(L) 22.1(L)  Platelets 150 - 400 K/uL 406(H) 438(H) 383    CMP Latest Ref Rng & Units 08/31/2018 08/30/2018 08/29/2018  Glucose 70 - 99 mg/dL 108(H) 122(H) 94  BUN 6 - 20 mg/dL 77(H) 75(H) 75(H)  Creatinine 0.44 - 1.00 mg/dL 5.74(H) 5.73(H) 5.86(H)  Sodium 135 - 145 mmol/L 140 142 141  Potassium 3.5 - 5.1 mmol/L 3.4(L) 3.0(L) 3.2(L)  Chloride 98 - 111 mmol/L 112(H) 111 106  CO2 22 - 32 mmol/L 16(L) 18(L) 19(L)  Calcium 8.9 - 10.3 mg/dL 7.0(L) 7.5(L) 7.5(L)  Total Protein 6.5 - 8.1 g/dL 5.9(L) - -  Total Bilirubin 0.3 - 1.2 mg/dL 0.7 - -  Alkaline Phos 38 - 126 U/L 60 - -  AST 15 - 41 U/L 21 - -  ALT 0 - 44 U/L 13 - -    Lab Results  Component Value Date   CALCIUM 7.0 (L) 08/31/2018   CAION 1.19 08/30/2008   PHOS 4.5 08/27/2018       Component Value Date/Time   COLORURINE YELLOW 08/26/2018 1255   APPEARANCEUR HAZY (A) 08/26/2018 1255   LABSPEC 1.011 08/26/2018 1255   PHURINE 5.0 08/26/2018 1255   GLUCOSEU NEGATIVE 08/26/2018 1255   HGBUR MODERATE (A) 08/26/2018 1255   BILIRUBINUR NEGATIVE 08/26/2018 [place] 08/26/2018 1255   PROTEINUR 30 (A) 08/26/2018 1255   NITRITE NEGATIVE 08/26/2018 1255   LEUKOCYTESUR NEGATIVE 08/26/2018 1255      Component Value Date/Time   PHART 7.443 08/24/2018 0256   PCO2ART 30.6 (L) 08/24/2018 0256   PO2ART 91.0 08/24/2018 0256   HCO3 20.9 08/24/2018 0256   TCO2 22 08/24/2018 0256   ACIDBASEDEF 3.0 (H) 08/24/2018 0256   O2SAT 98.0 08/24/2018 0256       Component Value Date/Time   FERRITIN 235 07/13/2010 2032       ASSESSMENT/PLAN:     Problem List Items Addressed This Visit       Respiratory   ARDS (adult respiratory distress syndrome) ([place])   Relevant Orders   DG Chest Port 1 View (Completed)   Acute respiratory failure with hypoxemia (HCC)   Relevant Orders   DG Chest Port 1 View (Completed)   DG Chest Port 1 View (Completed)   DG Chest Port 1 View (Completed)     Digestive   Ileus ([place])   Relevant Orders   DG Abd 1 View (Completed)     Other   Endotracheal tube present   Relevant Orders   DG Chest 1 View (Completed)   History of ETT   Relevant Orders   DG Chest 1  View (Completed)   DG CHEST PORT 1 VIEW (Completed)    Other Visit Diagnoses    Pneumonia       Relevant Medications   diphenhydrAMINE (BENADRYL) 25 MG tablet   meropenem (MERREM) 1 g in sodium chloride 0.9 % 100 mL IVPB   linezolid (ZYVOX) IVPB 600 mg   Other Relevant Orders   DG Chest 1 View (Completed)       1.  Baseline creatinine 0.89 10/2015 2.  AKI.  Likely due to ATN in the setting of vanco/zosyn, shock, and MOSF. Non-oliguric with good urine output, and no evidence of obstruction from CT on 08/26/18.  No need for HD.  Would continue supportive care.  Creatinine appears to have plateaued.   3.  ARDS. Continue vent management.    Oxygenation is stable.  Continue negative fluid balance. 4.  Acute Encephalopathy.  Minimize sedation. 5.  Peritonitis.    Antibiotics per ID. Monitor fevers    [employer], DO, FACP

## 2018-08-31 NOTE — Progress Notes (Signed)
[facility] notified of patients afib with rvr. Awaiting new orders.

## 2018-08-31 NOTE — Progress Notes (Signed)
Patient in Afib >145. PRN metoprolol given IV. Monitoring.

## 2018-08-31 NOTE — Progress Notes (Signed)
CRITICAL VALUE ALERT  Critical Value:  HGB 6.8  Date & Time Notied:  08/31/2018 0603  Provider Notified: Warren Lacy  Orders Received/Actions taken: Awaiting new orders.

## 2018-08-31 NOTE — Progress Notes (Signed)
[facility] Progress Note     Subjective: CC-  Patient went into Afib RVR earlier this morning and was started on cardizem. She is getting 1 unit PRBCs for hg 6.8. Abx changed yesterday for worsening pneumonia/fever, dapto changed to zyvox. Ostomy functioning and patient tolerating tube feeds at goal.  Objective: Vital signs in last 24 hours: Temp:  [98.5 F (36.9 C)-102.2 F (39 C)] 98.5 F (36.9 C) (01/06 0700) Pulse Rate:  [81-110] 109 (01/06 0746) Resp:  [19-30] 24 (01/06 0800) BP: (85-146)/(57-102) 137/67 (01/06 0800) SpO2:  [95 %-100 %] 95 % (01/06 0800) FiO2 (%):  [40 %] 40 % (01/06 0746) Weight:  [78.5 kg] 78.5 kg (01/06 0434) Last BM Date: 08/31/18  Intake/Output from previous day: 01/05 0701 - 01/06 0700 In: 3246.6 [I.V.:601; NG/GT:1702.7; IV Piggyback:942.9] Out: 1710 [Urine:1275; Drains:10; Stool:425] Intake/Output this shift: No intake/output data recorded.  PE: Gen:  Alert, NAD, on vent, opens eyes to verbal stimulus HEENT: EOM's intact, pupils equal and round Ext:  Calves soft and nontender Abd: Soft, mild distension, JP drain with minimal serous output, ostomy pink with area of slough at 3 o'clock position, yellow/brown stool in ostomy pouch, open midline incision with slough/necrosis at base/few sutures visible     Lab Results:  Recent Labs    08/30/18 0425 08/31/18 0500  WBC 11.1* 13.9*  HGB 7.3* 6.8*  HCT 22.8* 21.3*  PLT 438* 406*   BMET Recent Labs    08/30/18 0425 08/31/18 0500  NA 142 140  K 3.0* 3.4*  CL 111 112*  CO2 18* PENDING  GLUCOSE 122* 108*  BUN 75* 77*  CREATININE 5.73* 5.74*  CALCIUM 7.5* 7.0*   PT/INR No results for input(s): LABPROT, INR in the last 72 hours. CMP     Component Value Date/Time   NA 140 08/31/2018 0500   K 3.4 (L) 08/31/2018 0500   CL 112 (H) 08/31/2018 0500   CO2 PENDING 08/31/2018 0500   GLUCOSE 108 (H) 08/31/2018 0500   BUN 77 (H) 08/31/2018 0500   CREATININE 5.74 (H)  08/31/2018 0500   CREATININE 0.72 04/28/2014 1201   CALCIUM 7.0 (L) 08/31/2018 0500   PROT 5.9 (L) 08/31/2018 0500   ALBUMIN 1.1 (L) 08/31/2018 0500   AST 21 08/31/2018 0500   ALT 13 08/31/2018 0500   ALKPHOS 60 08/31/2018 0500   BILITOT 0.7 08/31/2018 0500   GFRNONAA 8 (L) 08/31/2018 0500   GFRNONAA >89 04/28/2014 1201   GFRAA 9 (L) 08/31/2018 0500   GFRAA >89 04/28/2014 1201   Lipase     Component Value Date/Time   LIPASE 87 (H) 08/23/2018 1834       Studies/Results: Dg Chest Port 1 View  Result Date: 08/30/2018 CLINICAL DATA:  Followup ventilator support EXAM: PORTABLE CHEST 1 VIEW COMPARISON:  08/27/2018 FINDINGS: Endotracheal tube tip is 2 cm above the carina. Nasogastric tube enters the stomach. Right arm PICC tip is at the SVC RA junction. Patchy bilateral bronchopneumonia persists, similar to the study of 3 days ago. Infiltrates in the right lung have worsened compared to older films including 08/25/2018. IMPRESSION: Lines and tubes well positioned. Persistent bilateral bronchopneumonia. Worsening infiltrate in the right lung. Electronically Signed   By: Nelson Chimes M.D.   On: 08/30/2018 06:40    Anti-infectives: Anti-infectives (From admission, onward)   Start     Dose/Rate Route Frequency Ordered Stop   08/30/18 1030  linezolid (ZYVOX) IVPB 600 mg     600 mg 300 mL/hr over  60 Minutes Intravenous Every 12 hours 08/30/18 0941     08/28/18 2000  DAPTOmycin (CUBICIN) 640 mg in sodium chloride 0.9 % IVPB  Status:  Discontinued     640 mg 225.6 mL/hr over 30 Minutes Intravenous Every 48 hours 08/28/18 1330 08/30/18 0941   08/28/18 1400  meropenem (MERREM) 1 g in sodium chloride 0.9 % 100 mL IVPB     1 g 200 mL/hr over 30 Minutes Intravenous Every 12 hours 08/28/18 1330     08/26/18 1030  DAPTOmycin (CUBICIN) 618.5 mg in sodium chloride 0.9 % IVPB  Status:  Discontinued     8 mg/kg  77.3 kg 224.7 mL/hr over 30 Minutes Intravenous Every 48 hours 08/26/18 0934 08/26/18  0935   08/26/18 1030  DAPTOmycin (CUBICIN) 620 mg in sodium chloride 0.9 % IVPB  Status:  Discontinued     620 mg 224.8 mL/hr over 30 Minutes Intravenous Every 48 hours 08/26/18 0935 08/27/18 1038   08/25/18 0300  vancomycin (VANCOCIN) IVPB 1000 mg/200 mL premix  Status:  Discontinued     1,000 mg 200 mL/hr over 60 Minutes Intravenous  Once 08/24/18 0122 08/24/18 1049   08/24/18 0100  vancomycin (VANCOCIN) IVPB 1000 mg/200 mL premix  Status:  Discontinued     1,000 mg 200 mL/hr over 60 Minutes Intravenous Every 12 hours 08/24/18 0045 08/24/18 0122   08/24/18 0100  piperacillin-tazobactam (ZOSYN) IVPB 2.25 g  Status:  Discontinued     2.25 g 100 mL/hr over 30 Minutes Intravenous Every 6 hours 08/24/18 0045 08/28/18 1304       Assessment/Plan Enterococcal bacteremia-ID following HTN GERD Depression Alcohol abuse Chronic pain/narcotic dependence -On Morphine at home Former smoker VDRF/ARDS- aspiration pneumonia -CCM Acute renal failure-creatinine 4.64>>5.86>>5.73>>5.74 Anemia-H/H:6.8/21.3 today and getting 1 unit PRBCs Hypokalemia - being replaced: Mag ok  Perforated colon/rectum S/p exploratory laparotomy, resection distal sigmoid colon and proximal rectum, diverting colostomy, wound vac placement 12/25 Dr. Noberto Retort - POD12 -surgical path?  -wound vac changed to wet to dry 08/28/18 - continue JP drain and monitor output - ostomy functioning, tolerating TF at goal  ID -vancomycin 12/30>>12/30;zosyn 12/30>>08/28/18,daptomycin 1/1>>1/5, Meropenem 1/3>> day#4, linezolid 1/5>>day#2 VTE -SCDs, sq heparin FEN -IVF, NPO/OGT, TPN Foley -in place  Plan:  Continue TID wet to dry dressing changes. Please page [place] when ostomy pouch needs to be changed. Tolerating TF at goal. Patient with rising WBC and persistent fevers, likely respiratory source. Last abdominal CT scan 1/1, could consider repeat scan in the next 1-2 days if not improving.   LOS: 8 days    Wellington Hampshire , [facility] 08/31/2018, 8:08 AM Pager: [phone] Mon 7:00 am -11:30 AM Tues-Fri 7:00 am-4:30 pm Sat-Sun 7:00 am-11:30 am

## 2018-08-31 NOTE — Progress Notes (Signed)
[facility] Progress Note Patient Name: Julie Williams DOB: Dec 12, 1965 MRN: [id number]   Date of Service  08/31/2018  HPI/Events of Note  AFIB with RVR - Ventricular rate = 122. BP = 119/71. CMP pending from 5 AM.  eICU Interventions  Will order: 1. Cardizem IV infusion. Titrate to HR 65-105.  2. Mg++ level STAT.  3. Await CMP results.      Intervention Category Major Interventions: Arrhythmia - evaluation and management  Sommer,Steven Eugene 08/31/2018, 5:47 AM
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
## 2018-08-31 NOTE — Progress Notes (Addendum)
9:20am-CSW spoke with Otila Kluver from [facility] and was informed that they also have no further contacts listed for pt at this time. CSW still continuing to follow for further needs.   8:20am-CSW spoke with Delsa Sale from [facility] in [place] and was informed that pt didn't list any contacts in their records.   7:59am-CSW received call from Deputy [place] with [facility] [employer]. CSW was informed that no one was located at the home and per record history no other person has been listed at this address aside from pt.   7:24am-CSW received call from NP on unit. CSW gave update to her regarding CSW's inability to locate pt's son at this time. CSW advised that CSW would follow up with [facility] [employer] to see if they are able to give CSW any information on pt or pt's son whereabouts. CSW spoke with someone from [facility] [employer] and was informed that per their system, pt is the only one listed at that address. CSW was advised that they would still send an office to this address to see if anyone else could be located. CSW awaits call back at this time.    CSW still following pt for further needs and to assess when appropriate. CSW aware that pt still remains on vent at this time. CSW previously received  verabl permission from [place] to reach out to son if located. CSW has been unable to locate pt's son or any leads to locate pt's son at this time. CSW will remained involved for further needs.    Virgie Dad Tylon Kemmerling, MSW, [facility] Clinical Social Worker [phone]

## 2018-08-31 NOTE — Progress Notes (Addendum)
[facility] for Infectious Disease  Date of Admission:  08/23/2018     Total days of antibiotics 14         ASSESSMENT/PLAN  Ms. Pitt has continued fevers with her treatment for peritonitis and Fusobacterium bacterium resulting from recent rectal perforation with continued open abdomen. This has further been complicated by development of pneumonia and abdominal wound infection. She is on Day 14 of antimicrobial therapy currently with linezolid and meropenem.  Did have candida albicans in recent tracheal aspirate, however most recent respiratory culture with no growth to date. Fever curve may be trending down with change to linezolid. Chest x-ray remains significantly unchanged. Would recommend continuing current course and observing fever curve.   1. Continue current doses of linezolid and meropenem.  2. Monitor fevers, WBC count and cultures. 3. Continue to hold on anti-fungal coverage at present.    Principal Problem:   Polymicrobial bacterial infection Active Problems:   ARDS (adult respiratory distress syndrome) (HCC)   Acute respiratory failure with hypoxemia (HCC)   Stercoral ulcer of rectum   Rectal perforation (HCC)   Peritonitis ([place])   Endotracheal tube present   History of ETT   Ileus ([place])   . sodium chloride   Intravenous Once  . amiodarone  200 mg Oral BID  . chlorhexidine gluconate (MEDLINE KIT)  15 mL Mouth Rinse BID  . Chlorhexidine Gluconate Cloth  6 each Topical Daily  . clonazePAM  1 mg Per Tube BID  . fentaNYL  75 mcg Transdermal Q72H  . heparin  5,000 Units Subcutaneous Q8H  . mouth rinse  15 mL Mouth Rinse 10 times per day  . metoprolol tartrate  25 mg Oral BID  . pantoprazole sodium  40 mg Per Tube Daily  . QUEtiapine  50 mg Oral QHS  . sodium chloride flush  10-40 mL Intracatheter Q12H  . sodium chloride flush  10-40 mL Intracatheter Q12H    SUBJECTIVE:  Febrile overnight with temperature max of 102.2. Slight increase in leukocytosis to  13.9. Atrial fibrillation overnight and started on cardizem infusion. Anemic with hemoglobin of 6.8 with 1 unit of PRBC ordered.   Sedated and intubated.  Allergies  Allergen Reactions  . Bee Venom Swelling     Review of Systems: Review of Systems  Unable to perform ROS: Intubated      OBJECTIVE: Vitals:   08/31/18 0900 08/31/18 1000 08/31/18 1100 08/31/18 1122  BP: (!) 156/82 106/64 90/62 (!) 88/57  Pulse:    80  Resp: (!) 27 (!) 23 (!) 28 (!) 29  Temp:   98.7 F (37.1 C)   TempSrc:   Oral   SpO2: 94% 92% 94% 95%  Weight:      Height:       Body mass index is 28.8 kg/m.  Physical Exam Constitutional:      General: She is not in acute distress.    Appearance: She is well-developed. She is ill-appearing.     Interventions: She is sedated, intubated and restrained.  Cardiovascular:     Rate and Rhythm: Normal rate. Rhythm irregularly irregular.     Heart sounds: Normal heart sounds.     Comments: Right upper extremity PICC appears with clean dressing and no evidence of infection.  Pulmonary:     Effort: Pulmonary effort is normal. She is intubated.     Breath sounds: Rhonchi present.     Comments: On vent; PRVC Abdominal:     Comments: JP drain is charged  with scant serous drainage. Abdominal dressing are clean and dry. There is mild distention throughout the abdomen.   Skin:    General: Skin is warm and dry.     Lab Results Lab Results  Component Value Date   WBC 13.9 (H) 08/31/2018   HGB 6.8 (LL) 08/31/2018   HCT 21.3 (L) 08/31/2018   MCV 100.5 (H) 08/31/2018   PLT 406 (H) 08/31/2018    Lab Results  Component Value Date   CREATININE 5.74 (H) 08/31/2018   BUN 77 (H) 08/31/2018   NA 140 08/31/2018   K 3.4 (L) 08/31/2018   CL 112 (H) 08/31/2018   CO2 16 (L) 08/31/2018    Lab Results  Component Value Date   ALT 13 08/31/2018   AST 21 08/31/2018   ALKPHOS 60 08/31/2018   BILITOT 0.7 08/31/2018     Microbiology: Recent Results (from the past  240 hour(s))  Urine culture     Status: None   Collection Time: 08/23/18  5:36 PM  Result Value Ref Range Status   Specimen Description URINE, RANDOM  Final   Special Requests   Final    NONE Performed at [facility], [place] [address] [place], [place] [address]    Culture NO GROWTH  Final   Report Status 08/24/2018 FINAL  Final  Culture, respiratory (tracheal aspirate)     Status: None   Collection Time: 08/23/18  5:36 PM  Result Value Ref Range Status   Specimen Description TRACHEAL ASPIRATE  Final   Special Requests NONE  Final   Gram Stain   Final    FEW WBC PRESENT,BOTH PMN AND MONONUCLEAR FEW YEAST WITH PSEUDOHYPHAE Performed at [facility], [place] [address], [place], [place] [address]    Culture FEW CANDIDA ALBICANS  Final   Report Status 08/25/2018 FINAL  Final  Culture, blood (routine x 2)     Status: None   Collection Time: 08/23/18  7:50 PM  Result Value Ref Range Status   Specimen Description BLOOD LEFT ANTECUBITAL  Final   Special Requests   Final    BOTTLES DRAWN AEROBIC AND ANAEROBIC Blood Culture adequate volume   Culture   Final    NO GROWTH 5 DAYS Performed at [facility], [place] [address], [place], [place] [address]    Report Status 08/28/2018 FINAL  Final  Culture, blood (routine x 2)     Status: None   Collection Time: 08/23/18 10:14 PM  Result Value Ref Range Status   Specimen Description BLOOD RIGHT ANTECUBITAL  Final   Special Requests   Final    BOTTLES DRAWN AEROBIC AND ANAEROBIC Blood Culture adequate volume   Culture   Final    NO GROWTH 5 DAYS Performed at [facility], [place] [address] [place], [place] [address]    Report Status 08/28/2018 FINAL  Final  Culture, blood (Routine X 2) w Reflex to ID Panel     Status: None (Preliminary result)   Collection Time: 08/26/18  1:20 PM  Result Value Ref Range Status   Specimen Description BLOOD BLOOD RIGHT HAND  Final   Special Requests AEROBIC BOTTLE ONLY Blood Culture adequate  volume  Final   Culture NO GROWTH 4 DAYS  Final   Report Status PENDING  Incomplete  Culture, blood (Routine X 2) w Reflex to ID Panel     Status: None (Preliminary result)   Collection Time: 08/26/18  1:20 PM  Result Value Ref Range  Status   Specimen Description BLOOD BLOOD LEFT HAND  Final   Special Requests AEROBIC BOTTLE ONLY Blood Culture adequate volume  Final   Culture NO GROWTH 4 DAYS  Final   Report Status PENDING  Incomplete  Culture, respiratory (non-expectorated)     Status: None   Collection Time: 08/26/18  1:33 PM  Result Value Ref Range Status   Specimen Description TRACHEAL ASPIRATE  Final   Special Requests NONE  Final   Gram Stain   Final    RARE WBC PRESENT, PREDOMINANTLY PMN RARE YEAST Performed at [facility], [place] [address], [place], [place] [address]    Culture FEW CANDIDA ALBICANS  Final   Report Status 08/29/2018 FINAL  Final  MRSA PCR Screening     Status: Abnormal   Collection Time: 08/28/18  8:21 AM  Result Value Ref Range Status   MRSA by PCR POSITIVE (A) NEGATIVE Final    Comment:        The GeneXpert MRSA Assay (FDA approved for NASAL specimens only), is one component of a comprehensive MRSA colonization surveillance program. It is not intended to diagnose MRSA infection nor to guide or monitor treatment for MRSA infections. RESULT CALLED TO, READ BACK BY AND VERIFIED WITH: Annette Stable RN 10:50 08/28/17 ([email or web address]) Performed at [facility], [place] [address], [place], [place] [address]   Culture, respiratory (non-expectorated)     Status: None (Preliminary result)   Collection Time: 08/30/18  9:21 AM  Result Value Ref Range Status   Specimen Description TRACHEAL ASPIRATE  Final   Special Requests NONE  Final   Gram Stain   Final    RARE WBC PRESENT, PREDOMINANTLY PMN RARE GRAM POSITIVE COCCI    Culture   Final    NO GROWTH 1 DAY Performed at [facility], [place] [address], [place], [place] [address]    Report Status PENDING   Incomplete     Terri Piedra, [place] for [place] Pager  08/31/2018  11:46 AM

## 2018-08-31 NOTE — Progress Notes (Signed)
[facility] Progress Note Patient Name: Julie Williams DOB: 02-27-1966 MRN: [id number]   Date of Service  08/31/2018  HPI/Events of Note  Anemia - Hgb = 6.8.  eICU Interventions  Will transfuse 1 unit PRBC.      Intervention Category Major Interventions: Other:  Hassan Blackshire Cornelia Copa 08/31/2018, 6:05 AM

## 2018-09-01 ENCOUNTER — Inpatient Hospital Stay (HOSPITAL_COMMUNITY): Payer: PPO

## 2018-09-01 DIAGNOSIS — J189 Pneumonia, unspecified organism: Secondary | ICD-10-CM

## 2018-09-01 DIAGNOSIS — R509 Fever, unspecified: Secondary | ICD-10-CM

## 2018-09-01 LAB — CBC
HEMATOCRIT: 23.7 % — AB (ref 36.0–46.0)
HEMOGLOBIN: 7.2 g/dL — AB (ref 12.0–15.0)
MCH: 30.3 pg (ref 26.0–34.0)
MCHC: 30.4 g/dL (ref 30.0–36.0)
MCV: 99.6 fL (ref 80.0–100.0)
Platelets: 434 10*3/uL — ABNORMAL HIGH (ref 150–400)
RBC: 2.38 MIL/uL — ABNORMAL LOW (ref 3.87–5.11)
RDW: 17.6 % — AB (ref 11.5–15.5)
WBC: 15.5 10*3/uL — ABNORMAL HIGH (ref 4.0–10.5)
nRBC: 0.2 % (ref 0.0–0.2)

## 2018-09-01 LAB — TYPE AND SCREEN
ABO/RH(D): B POS
Antibody Screen: NEGATIVE
Unit division: 0

## 2018-09-01 LAB — BASIC METABOLIC PANEL
Anion gap: 11 (ref 5–15)
BUN: 88 mg/dL — ABNORMAL HIGH (ref 6–20)
CO2: 16 mmol/L — ABNORMAL LOW (ref 22–32)
CREATININE: 6.46 mg/dL — AB (ref 0.44–1.00)
Calcium: 7.5 mg/dL — ABNORMAL LOW (ref 8.9–10.3)
Chloride: 117 mmol/L — ABNORMAL HIGH (ref 98–111)
GFR calc Af Amer: 8 mL/min — ABNORMAL LOW (ref >60)
GFR calc non Af Amer: 7 mL/min — ABNORMAL LOW (ref >60)
Glucose, Bld: 111 mg/dL — ABNORMAL HIGH (ref 70–99)
Potassium: 3.5 mmol/L (ref 3.5–5.1)
Sodium: 144 mmol/L (ref 135–145)

## 2018-09-01 LAB — BPAM RBC
Blood Product Expiration Date: 202001292359
ISSUE DATE / TIME: 202001061137
UNIT TYPE AND RH: 7300

## 2018-09-01 LAB — GLUCOSE, CAPILLARY
GLUCOSE-CAPILLARY: 94 mg/dL (ref 70–99)
GLUCOSE-CAPILLARY: 76 mg/dL (ref 70–99)
Glucose-Capillary: 104 mg/dL — ABNORMAL HIGH (ref 70–99)
Glucose-Capillary: 142 mg/dL — ABNORMAL HIGH (ref 70–99)
Glucose-Capillary: 92 mg/dL (ref 70–99)
Glucose-Capillary: 99 mg/dL (ref 70–99)

## 2018-09-01 LAB — PROCALCITONIN: Procalcitonin: 11.83 ng/mL

## 2018-09-01 MED ORDER — QUETIAPINE FUMARATE 50 MG PO TABS
50.0000 mg | ORAL_TABLET | Freq: Two times a day (BID) | ORAL | Status: DC
  Administered 2018-09-01 – 2018-09-02 (×3): 50 mg via ORAL
  Filled 2018-09-01 (×5): qty 1

## 2018-09-01 MED ORDER — MORPHINE SULFATE 15 MG PO TABS
15.0000 mg | ORAL_TABLET | Freq: Four times a day (QID) | ORAL | Status: DC
  Administered 2018-09-01 – 2018-09-02 (×4): 15 mg
  Filled 2018-09-01 (×4): qty 1

## 2018-09-01 MED ORDER — GUAIFENESIN 100 MG/5ML PO SOLN
30.0000 mL | Freq: Four times a day (QID) | ORAL | Status: DC
  Administered 2018-09-02 – 2018-09-13 (×22): 600 mg via ORAL
  Filled 2018-09-01 (×2): qty 30
  Filled 2018-09-01: qty 15
  Filled 2018-09-01 (×2): qty 30
  Filled 2018-09-01: qty 15
  Filled 2018-09-01: qty 30
  Filled 2018-09-01: qty 15
  Filled 2018-09-01 (×2): qty 30
  Filled 2018-09-01: qty 15
  Filled 2018-09-01 (×11): qty 30
  Filled 2018-09-01: qty 15
  Filled 2018-09-01 (×4): qty 30
  Filled 2018-09-01: qty 15
  Filled 2018-09-01 (×4): qty 30
  Filled 2018-09-01: qty 15
  Filled 2018-09-01 (×2): qty 30
  Filled 2018-09-01: qty 15
  Filled 2018-09-01 (×2): qty 30
  Filled 2018-09-01: qty 15
  Filled 2018-09-01 (×2): qty 30
  Filled 2018-09-01: qty 15

## 2018-09-01 MED ORDER — FUROSEMIDE 10 MG/ML IJ SOLN
80.0000 mg | Freq: Once | INTRAMUSCULAR | Status: AC
  Administered 2018-09-01: 80 mg via INTRAVENOUS
  Filled 2018-09-01: qty 8

## 2018-09-01 MED ORDER — FENTANYL 75 MCG/HR TD PT72
75.0000 ug | MEDICATED_PATCH | TRANSDERMAL | Status: DC
  Administered 2018-09-02 – 2018-09-05 (×2): 75 ug via TRANSDERMAL
  Filled 2018-09-01 (×2): qty 1

## 2018-09-01 MED ORDER — GUAIFENESIN ER 600 MG PO TB12
1200.0000 mg | ORAL_TABLET | Freq: Two times a day (BID) | ORAL | Status: DC
  Filled 2018-09-01 (×2): qty 2

## 2018-09-01 NOTE — Progress Notes (Addendum)
CSW still attempting to locate pt's son as MD and other staff are requesting the need to speak with next of kin. CSW made aware  By RN that a name for pt's son Sarita Bottom) is listed in a letter from MD Jerel Shepherd who works with  [facility]. CSW reached out to the number provided for the facility and spoke with [place]. CSW was advised that they also do not have a number or a name listed in pt's chart for next of kin.    CSW still attempting to locate pt's son as requested at this time.       Virgie Dad Emari Hreha, MSW, [facility] Clinical Social Worker [phone]

## 2018-09-01 NOTE — Progress Notes (Signed)
[facility] Progress Note     Subjective: CC-  Attempted to wean vent this morning but patient only lasted 5 minutes before becoming agitated. WBC up again 15.5 and TMAX 101.9. Tolerating tube feeds and having ostomy output, 1450cc/24 hr.  Objective: Vital signs in last 24 hours: Temp:  [98.5 F (36.9 C)-101.9 F (38.8 C)] 100.5 F (38.1 C) (01/07 0745) Pulse Rate:  [78-137] 87 (01/07 0727) Resp:  [22-42] 26 (01/07 0745) BP: (87-156)/(53-82) 107/71 (01/07 0745) SpO2:  [92 %-100 %] 96 % (01/07 0745) FiO2 (%):  [40 %] 40 % (01/07 0727) Weight:  [81.3 kg] 81.3 kg (01/07 0359) Last BM Date: 09/01/18  Intake/Output from previous day: 01/06 0701 - 01/07 0700 In: 3813.9 [I.V.:756.6; Blood:687.4; NG/GT:1570; IV Piggyback:799.9] Out: 2365 [Urine:700; Emesis/NG output:150; Drains:65; Stool:1450] Intake/Output this shift: Total I/O In: 24.7 [I.V.:24.7] Out: 75 [Urine:75]  PE: Gen:  Alert, NAD, on vent, opens eyes to verbal stimulus HEENT: EOM's intact, pupils equal and round Abd: Soft, mild distension, ostomy pink with improving area of slough at 3 o'clock position, yellow/brown stool in ostomy pouch, open midline incision with slough/necrosis at base/few sutures visible  Lab Results:  Recent Labs    08/31/18 0500 09/01/18 0405  WBC 13.9* 15.5*  HGB 6.8* 7.2*  HCT 21.3* 23.7*  PLT 406* 434*   BMET Recent Labs    08/31/18 0500 09/01/18 0405  NA 140 144  K 3.4* 3.5  CL 112* 117*  CO2 16* 16*  GLUCOSE 108* 111*  BUN 77* 88*  CREATININE 5.74* 6.46*  CALCIUM 7.0* 7.5*   PT/INR No results for input(s): LABPROT, INR in the last 72 hours. CMP     Component Value Date/Time   NA 144 09/01/2018 0405   K 3.5 09/01/2018 0405   CL 117 (H) 09/01/2018 0405   CO2 16 (L) 09/01/2018 0405   GLUCOSE 111 (H) 09/01/2018 0405   BUN 88 (H) 09/01/2018 0405   CREATININE 6.46 (H) 09/01/2018 0405   CREATININE 0.72 04/28/2014 1201   CALCIUM 7.5 (L) 09/01/2018 0405   PROT 5.9 (L) 08/31/2018 0500   ALBUMIN 1.1 (L) 08/31/2018 0500   AST 21 08/31/2018 0500   ALT 13 08/31/2018 0500   ALKPHOS 60 08/31/2018 0500   BILITOT 0.7 08/31/2018 0500   GFRNONAA 7 (L) 09/01/2018 0405   GFRNONAA >89 04/28/2014 1201   GFRAA 8 (L) 09/01/2018 0405   GFRAA >89 04/28/2014 1201   Lipase     Component Value Date/Time   LIPASE 87 (H) 08/23/2018 1834       Studies/Results: Dg Chest 1 View  Result Date: 08/31/2018 CLINICAL DATA:  Pneumonia EXAM: CHEST  1 VIEW COMPARISON:  08/30/2018 FINDINGS: Endotracheal tube with tip 2.7 cm above the carina, stable. Right upper extremity PICC with tip at the lower SVC. An orogastric tube reaches the stomach. Patchy bilateral lung opacities that are unchanged. Stable heart size. No effusion or pneumothorax. IMPRESSION: 1. Stable hardware positioning. 2. History of pneumonia with unchanged bilateral lung opacity. Electronically Signed   By: Monte Fantasia M.D.   On: 08/31/2018 09:20    Anti-infectives: Anti-infectives (From admission, onward)   Start     Dose/Rate Route Frequency Ordered Stop   08/30/18 1030  linezolid (ZYVOX) IVPB 600 mg     600 mg 300 mL/hr over 60 Minutes Intravenous Every 12 hours 08/30/18 0941     08/28/18 2000  DAPTOmycin (CUBICIN) 640 mg in sodium chloride 0.9 % IVPB  Status:  Discontinued  640 mg 225.6 mL/hr over 30 Minutes Intravenous Every 48 hours 08/28/18 1330 08/30/18 0941   08/28/18 1400  meropenem (MERREM) 1 g in sodium chloride 0.9 % 100 mL IVPB     1 g 200 mL/hr over 30 Minutes Intravenous Every 12 hours 08/28/18 1330     08/26/18 1030  DAPTOmycin (CUBICIN) 618.5 mg in sodium chloride 0.9 % IVPB  Status:  Discontinued     8 mg/kg  77.3 kg 224.7 mL/hr over 30 Minutes Intravenous Every 48 hours 08/26/18 0934 08/26/18 0935   08/26/18 1030  DAPTOmycin (CUBICIN) 620 mg in sodium chloride 0.9 % IVPB  Status:  Discontinued     620 mg 224.8 mL/hr over 30 Minutes Intravenous Every 48 hours 08/26/18  0935 08/27/18 1038   08/25/18 0300  vancomycin (VANCOCIN) IVPB 1000 mg/200 mL premix  Status:  Discontinued     1,000 mg 200 mL/hr over 60 Minutes Intravenous  Once 08/24/18 0122 08/24/18 1049   08/24/18 0100  vancomycin (VANCOCIN) IVPB 1000 mg/200 mL premix  Status:  Discontinued     1,000 mg 200 mL/hr over 60 Minutes Intravenous Every 12 hours 08/24/18 0045 08/24/18 0122   08/24/18 0100  piperacillin-tazobactam (ZOSYN) IVPB 2.25 g  Status:  Discontinued     2.25 g 100 mL/hr over 30 Minutes Intravenous Every 6 hours 08/24/18 0045 08/28/18 1304       Assessment/Plan Enterococcal bacteremia, fusobacterium and enterococcus/aspiration pneumonia-ID following HTN GERD Depression Alcohol abuse Chronic pain/narcotic dependence -On Morphine at home Former smoker VDRF/ARDS- aspiration pneumonia -CCM Acute renal failure-creatinine 4.64>>5.86>>5.73>>5.74>>6.46, nephrology following Anemia-H/H:7.2/23.7 today and s/p 1 unit PRBCs 1/6 Hypokalemia - K3.5  Perforated colon/rectum S/p exploratory laparotomy, resection distal sigmoid colon and proximal rectum, diverting colostomy, wound vac placement 12/25 Dr. Noberto Retort - POD13 -surgical path?  -wound vac changed to wet to dry 08/28/18 - continue JP drain and monitor output - ostomy functioning, tolerating TF at goal - last CT abdomen/pelvis 1/1  ID -vancomycin 12/30>>12/30;zosyn 12/30>>08/28/18,daptomycin 1/1>>1/5, Meropenem 1/3>> day#5, linezolid 1/5>>day#3 VTE -SCDs, sq heparin FEN -IVF, NPO/OGT, TPN Foley -in place  Plan: Continue TID wet to dry dressing changes, tube feedings. Monitor ostomy output closely to ensure output does not get too high.  WBC up again and patient with persistent fevers, CCM treating for respiratory source. If patient continues to not improve may consider repeat CT abdomen/pelvis tomorrow to ensure no developing intraabdominal source.   LOS: 9 days    Wellington Hampshire , [facility]  Surgery 09/01/2018, 8:35 AM Pager: [phone] Mon 7:00 am -11:30 AM Tues-Fri 7:00 am-4:30 pm Sat-Sun 7:00 am-11:30 am

## 2018-09-01 NOTE — Progress Notes (Signed)
[facility] Progress Note Patient Name: Julie Williams DOB: Oct 29, 1965 MRN: [id number]   Date of Service  09/01/2018  HPI/Events of Note  Agitation - Request to renew bilateral soft wrist restraint orders.   eICU Interventions  Will renew bilateral soft wrist restraint orders.      Intervention Category Minor Interventions: Agitation / anxiety - evaluation and management  Camden Mazzaferro Eugene 09/01/2018, 12:22 AM

## 2018-09-01 NOTE — Progress Notes (Signed)
53 year old woman admitted to [facility] 12/24 with colonic perforation requiring colectomy, course complicated by aspiration pneumonia requiring mechanical ventilation 12/28 and bacteremia with fusobacterium and enterococcus, transferred to [facility] on 12/29 for management of ARDS. She has a history of chronic pain on opiates  She is low-grade febrile 100, tolerated Precedex and fentanyl but has periods of breakthrough agitation.  She converted back to normal sinus rhythm. On exam-agitated, requiring bolus Versed and fentanyl, mild secretions, soft abdomen with brown feces in ostomy bag, JP drain with serosanguineous fluid, viscous appearing  Chest x-ray personally reviewed which shows bilateral patchy infiltrates as prior with some increased consolidation in the right upper lobe  Labs show worsening creatinine from 5.7-6.4, decreasing procalcitonin from 15-11, mild increased leukocytosis and mild anemia.  Impression/plan  Sepsis with persistent fever and leukocytosis although procalcitonin slight decreasing-being treated for enterococcus and Fusobacterium bacteremia/due to aspiration pneumonia with meropenem and Zyvox, await final respiratory culture from 1/5, repeat blood cultures from 1/6 are negative so far  Have to consider TEE here, may have to add empiric antifungals , last CT abdomen from 1/1 does not show any evidence of abdominal abscess but may have to reimage  Septic shock/ARDS-resolved  Acute hypoxic respiratory failure-spontaneous breathing trials currently limited by agitation and secretions  Acute encephalopathy/chronic opiate use -we will reinstate morphine IR 15 mg every 6 hours and see if this decreases fentanyl requirements can discontinue fentanyl patch tomorrow. Continue Precedex, Seroquel and clonazepam  AKI -creatinine has increased, will give 80 of Lasix, she remains nonoliguric  Atrial fibrillation/RVR-she converted to sinus rhythm, continue amiodarone and low-dose  metoprolol  Prognosis remains guarded given multiorgan failure and persistent sepsis Still trying to contact son so that we can make decisions going forward  The patient is critically ill with multiple organ systems failure and requires high complexity decision making for assessment and support, frequent evaluation and titration of therapies, application of advanced monitoring technologies and extensive interpretation of multiple databases. Critical Care Time devoted to patient care services described in this note independent of APP/resident  time is 35 minutes.   Leanna Sato Elsworth Soho MD

## 2018-09-01 NOTE — Consult Note (Addendum)
[facility] Nurse ostomy follow up Stoma type/location: LLQ end colostomy Stomal assessment/size: 1 3/4" with sloughing mucosa from 3 to 6 o'clock, yellow fibrous tissue Peristomal assessment: erythema, protected with barrier ring Treatment options for stomal/peristomal skin: barrier ring Output liquid yellow stool Ostomy pouching: /2pc. 2 3/4" pouch  Education provided: none  Patient on vent Enrolled patient in [employer] Discharge program: No WOC team will follow weekly.  Please re-consult if needed further.  Domenic Moras MSN, RN, FNP-BC CWON Wound, Ostomy, Continence Nurse Pager [phone]
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
## 2018-09-01 NOTE — Progress Notes (Signed)
[facility]  Date of Admission:  08/23/2018     Total days of antibiotics 15         ASSESSMENT/PLAN  Julie Williams continues to have fever of unclear origin and continues to receive treatment for peritonitis, Fusobacterium bacteremia and pneumonia following recent rectal perforation with continued open abdomen. Repeat blood cultures are without growth. Respiratory culture performed on 1/6 with culture showing yeast which is likely not pathogenic in this situation. She remains on broad spectrum coverage with linezolid and meropenem. She has several potential sources of infection including PICC, additional abdominal infection, burden of infection or drug fever. Unlikely DVT as she is in heparin.  Agree with additional imaging if fevers continue.  1. Continue current meropenem and linezolid.  2. Monitor fevers, WBC count and cultures.  Principal Problem:   Fever Active Problems:   ARDS (adult respiratory distress syndrome) (HCC)   Acute respiratory failure with hypoxemia (HCC)   Stercoral ulcer of rectum   Rectal perforation (HCC)   Peritonitis (HCC)   Polymicrobial bacterial infection   Endotracheal tube present   History of ETT   Ileus ([place])   AKI (acute kidney injury) ([place])   . amiodarone  200 mg Oral BID  . chlorhexidine gluconate (MEDLINE KIT)  15 mL Mouth Rinse BID  . Chlorhexidine Gluconate Cloth  6 each Topical Daily  . clonazePAM  1 mg Per Tube BID  . diltiazem  30 mg Oral Q6H  . [START ON 09/02/2018] fentaNYL  75 mcg Transdermal Q72H  . furosemide  80 mg Intravenous Once  . guaiFENesin  1,200 mg Oral BID  . heparin  5,000 Units Subcutaneous Q8H  . mouth rinse  15 mL Mouth Rinse 10 times per day  . metoprolol tartrate  25 mg Oral BID  . morphine  15 mg Per Tube Q6H  . pantoprazole sodium  40 mg Per Tube Daily  . QUEtiapine  50 mg Oral BID  . sodium chloride flush  10-40 mL Intracatheter Q12H    SUBJECTIVE:  Continues to be febrile with  maximum temperature of 101.9 in the last 24 hours and WBC increasing to 15.5. Respiratory culture with rare yeast and incubated for better growth. Repeated blood cultures without growth to date. Failed wean attempt this morning. Remains sedated due to agitation.    Allergies  Allergen Reactions  . Bee Venom Swelling     Review of Systems: Review of Systems  Unable to perform ROS: Intubated      OBJECTIVE: Vitals:   09/01/18 0612 09/01/18 0727 09/01/18 0732 09/01/18 0745  BP: 101/71 99/73  107/71  Pulse:  87    Resp:  (!) 28  (!) 26  Temp:   (!) 100.6 F (38.1 C) (!) 100.5 F (38.1 C)  TempSrc:   Axillary Axillary  SpO2:  100%  96%  Weight:      Height:       Body mass index is 29.83 kg/m.  Physical Exam Constitutional:      General: She is not in acute distress.    Appearance: She is well-developed. She is ill-appearing.     Interventions: She is sedated, intubated and restrained.  Cardiovascular:     Rate and Rhythm: Normal rate and regular rhythm.     Heart sounds: Normal heart sounds.     Comments: Right upper extremity PICC line appears with clean and dry dressing and no evidence of infection.  Pulmonary:     Effort: Pulmonary  effort is normal. She is intubated.     Breath sounds: Normal breath sounds.  Abdominal:     Comments: JP drain charged with serosanguinous drainage.   Skin:    General: Skin is warm and dry.     Lab Results Lab Results  Component Value Date   WBC 15.5 (H) 09/01/2018   HGB 7.2 (L) 09/01/2018   HCT 23.7 (L) 09/01/2018   MCV 99.6 09/01/2018   PLT 434 (H) 09/01/2018    Lab Results  Component Value Date   CREATININE 6.46 (H) 09/01/2018   BUN 88 (H) 09/01/2018   NA 144 09/01/2018   K 3.5 09/01/2018   CL 117 (H) 09/01/2018   CO2 16 (L) 09/01/2018    Lab Results  Component Value Date   ALT 13 08/31/2018   AST 21 08/31/2018   ALKPHOS 60 08/31/2018   BILITOT 0.7 08/31/2018     Microbiology: Recent Results (from the past  240 hour(s))  Urine culture     Status: None   Collection Time: 08/23/18  5:36 PM  Result Value Ref Range Status   Specimen Description URINE, RANDOM  Final   Special Requests   Final    NONE Performed at [facility], [address], [place], [place] [address]    Culture NO GROWTH  Final   Report Status 08/24/2018 FINAL  Final  Culture, respiratory (tracheal aspirate)     Status: None   Collection Time: 08/23/18  5:36 PM  Result Value Ref Range Status   Specimen Description TRACHEAL ASPIRATE  Final   Special Requests NONE  Final   Gram Stain   Final    FEW WBC PRESENT,BOTH PMN AND MONONUCLEAR FEW YEAST WITH PSEUDOHYPHAE Performed at [facility], [place] [address], [place], [place] [address]    Culture FEW CANDIDA ALBICANS  Final   Report Status 08/25/2018 FINAL  Final  Culture, blood (routine x 2)     Status: None   Collection Time: 08/23/18  7:50 PM  Result Value Ref Range Status   Specimen Description BLOOD LEFT ANTECUBITAL  Final   Special Requests   Final    BOTTLES DRAWN AEROBIC AND ANAEROBIC Blood Culture adequate volume   Culture   Final    NO GROWTH 5 DAYS Performed at [facility], [place] [address], [place], [place] [address]    Report Status 08/28/2018 FINAL  Final  Culture, blood (routine x 2)     Status: None   Collection Time: 08/23/18 10:14 PM  Result Value Ref Range Status   Specimen Description BLOOD RIGHT ANTECUBITAL  Final   Special Requests   Final    BOTTLES DRAWN AEROBIC AND ANAEROBIC Blood Culture adequate volume   Culture   Final    NO GROWTH 5 DAYS Performed at [facility], [place] [address], [place], [place] [address]    Report Status 08/28/2018 FINAL  Final  Culture, blood (Routine X 2) w Reflex to ID Panel     Status: None   Collection Time: 08/26/18  1:20 PM  Result Value Ref Range Status   Specimen Description BLOOD BLOOD RIGHT HAND  Final   Special Requests AEROBIC BOTTLE ONLY Blood Culture adequate volume  Final    Culture NO GROWTH 5 DAYS  Final   Report Status 08/31/2018 FINAL  Final  Culture, blood (Routine X 2) w Reflex to ID Panel     Status: None   Collection Time: 08/26/18  1:20  PM  Result Value Ref Range Status   Specimen Description BLOOD BLOOD LEFT HAND  Final   Special Requests AEROBIC BOTTLE ONLY Blood Culture adequate volume  Final   Culture NO GROWTH 5 DAYS  Final   Report Status 08/31/2018 FINAL  Final  Culture, respiratory (non-expectorated)     Status: None   Collection Time: 08/26/18  1:33 PM  Result Value Ref Range Status   Specimen Description TRACHEAL ASPIRATE  Final   Special Requests NONE  Final   Gram Stain   Final    RARE WBC PRESENT, PREDOMINANTLY PMN RARE YEAST Performed at [facility], [place] [address], [place], [place] [address]    Culture FEW CANDIDA ALBICANS  Final   Report Status 08/29/2018 FINAL  Final  MRSA PCR Screening     Status: Abnormal   Collection Time: 08/28/18  8:21 AM  Result Value Ref Range Status   MRSA by PCR POSITIVE (A) NEGATIVE Final    Comment:        The GeneXpert MRSA Assay (FDA approved for NASAL specimens only), is one component of a comprehensive MRSA colonization surveillance program. It is not intended to diagnose MRSA infection nor to guide or monitor treatment for MRSA infections. RESULT CALLED TO, READ BACK BY AND VERIFIED WITH: Annette Stable RN 10:50 08/28/17 ([email or web address]) Performed at [facility], [place] [address] [place], [place] [address]   Culture, respiratory (non-expectorated)     Status: None (Preliminary result)   Collection Time: 08/30/18  9:21 AM  Result Value Ref Range Status   Specimen Description TRACHEAL ASPIRATE  Final   Special Requests NONE  Final   Gram Stain   Final    RARE WBC PRESENT, PREDOMINANTLY PMN RARE GRAM POSITIVE COCCI    Culture   Final    RARE YEAST CULTURE REINCUBATED FOR BETTER GROWTH Performed at [facility], [place] [address], [place], [place] [address]    Report Status  PENDING  Incomplete  Culture, blood (routine x 2)     Status: None (Preliminary result)   Collection Time: 08/31/18  4:48 PM  Result Value Ref Range Status   Specimen Description BLOOD LEFT ANTECUBITAL  Final   Special Requests   Final    BOTTLES DRAWN AEROBIC ONLY Blood Culture adequate volume   Culture   Final    NO GROWTH < 24 HOURS Performed at [facility], [place] [address] [place], [place] [address]    Report Status PENDING  Incomplete  Culture, blood (routine x 2)     Status: None (Preliminary result)   Collection Time: 08/31/18  4:55 PM  Result Value Ref Range Status   Specimen Description BLOOD THUMB  Final   Special Requests   Final    BOTTLES DRAWN AEROBIC ONLY Blood Culture adequate volume   Culture   Final    NO GROWTH < 24 HOURS Performed at [facility], [place] [address] [place], [place] [address]    Report Status PENDING  Incomplete     Terri Piedra, [facility] [place] Pager  09/01/2018  10:57 AM

## 2018-09-02 ENCOUNTER — Inpatient Hospital Stay (HOSPITAL_COMMUNITY): Payer: PPO

## 2018-09-02 DIAGNOSIS — B961 Klebsiella pneumoniae [K. pneumoniae] as the cause of diseases classified elsewhere: Secondary | ICD-10-CM

## 2018-09-02 DIAGNOSIS — R1110 Vomiting, unspecified: Secondary | ICD-10-CM

## 2018-09-02 DIAGNOSIS — B9620 Unspecified Escherichia coli [E. coli] as the cause of diseases classified elsewhere: Secondary | ICD-10-CM

## 2018-09-02 LAB — GLUCOSE, CAPILLARY
Glucose-Capillary: 91 mg/dL (ref 70–99)
Glucose-Capillary: 87 mg/dL (ref 70–99)
Glucose-Capillary: 93 mg/dL (ref 70–99)
Glucose-Capillary: 82 mg/dL (ref 70–99)
Glucose-Capillary: 79 mg/dL (ref 70–99)
Glucose-Capillary: 88 mg/dL (ref 70–99)

## 2018-09-02 LAB — CBC
HCT: 23.5 % — ABNORMAL LOW (ref 36.0–46.0)
Hemoglobin: 7 g/dL — ABNORMAL LOW (ref 12.0–15.0)
MCH: 30.3 pg (ref 26.0–34.0)
MCHC: 29.8 g/dL — ABNORMAL LOW (ref 30.0–36.0)
MCV: 101.7 fL — ABNORMAL HIGH (ref 80.0–100.0)
PLATELETS: 482 10*3/uL — AB (ref 150–400)
RBC: 2.31 MIL/uL — ABNORMAL LOW (ref 3.87–5.11)
RDW: 17.4 % — ABNORMAL HIGH (ref 11.5–15.5)
WBC: 14.3 10*3/uL — ABNORMAL HIGH (ref 4.0–10.5)
nRBC: 0.3 % — ABNORMAL HIGH (ref 0.0–0.2)

## 2018-09-02 LAB — BASIC METABOLIC PANEL
Anion gap: 13 (ref 5–15)
BUN: 97 mg/dL — ABNORMAL HIGH (ref 6–20)
CALCIUM: 7.4 mg/dL — AB (ref 8.9–10.3)
CO2: 14 mmol/L — ABNORMAL LOW (ref 22–32)
Chloride: 116 mmol/L — ABNORMAL HIGH (ref 98–111)
Creatinine, Ser: 6.32 mg/dL — ABNORMAL HIGH (ref 0.44–1.00)
GFR calc Af Amer: 8 mL/min — ABNORMAL LOW (ref >60)
GFR calc non Af Amer: 7 mL/min — ABNORMAL LOW (ref >60)
Glucose, Bld: 108 mg/dL — ABNORMAL HIGH (ref 70–99)
Potassium: 3.5 mmol/L (ref 3.5–5.1)
Sodium: 143 mmol/L (ref 135–145)

## 2018-09-02 LAB — CULTURE, RESPIRATORY W GRAM STAIN

## 2018-09-02 LAB — PHOSPHORUS: Phosphorus: 7.5 mg/dL — ABNORMAL HIGH (ref 2.5–4.6)

## 2018-09-02 LAB — PROCALCITONIN: Procalcitonin: 7.43 ng/mL

## 2018-09-02 LAB — MAGNESIUM: Magnesium: 2 mg/dL (ref 1.7–2.4)

## 2018-09-02 MED ORDER — METOPROLOL TARTRATE 5 MG/5ML IV SOLN
2.5000 mg | Freq: Four times a day (QID) | INTRAVENOUS | Status: DC
  Administered 2018-09-03 – 2018-09-08 (×23): 2.5 mg via INTRAVENOUS
  Filled 2018-09-02 (×21): qty 5

## 2018-09-02 MED ORDER — FUROSEMIDE 10 MG/ML IJ SOLN
120.0000 mg | Freq: Once | INTRAVENOUS | Status: AC
  Administered 2018-09-02: 120 mg via INTRAVENOUS
  Filled 2018-09-02: qty 10

## 2018-09-02 MED ORDER — PROMETHAZINE HCL 25 MG/ML IJ SOLN
12.5000 mg | Freq: Once | INTRAMUSCULAR | Status: AC
  Administered 2018-09-02: 12.5 mg via INTRAVENOUS
  Filled 2018-09-02: qty 1

## 2018-09-02 MED ORDER — PANTOPRAZOLE SODIUM 40 MG IV SOLR
40.0000 mg | Freq: Two times a day (BID) | INTRAVENOUS | Status: DC
  Administered 2018-09-02 – 2018-09-05 (×7): 40 mg via INTRAVENOUS
  Filled 2018-09-02 (×8): qty 40

## 2018-09-02 MED ORDER — MORPHINE SULFATE (PF) 2 MG/ML IV SOLN
2.0000 mg | Freq: Three times a day (TID) | INTRAVENOUS | Status: DC
  Administered 2018-09-02 – 2018-09-03 (×4): 2 mg via INTRAVENOUS
  Filled 2018-09-02 (×6): qty 1
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
## 2018-09-02 NOTE — Progress Notes (Signed)
[facility] for Infectious Disease  Date of Admission:  08/23/2018     Total days of antibiotics 16         ASSESSMENT/PLAN  Julie Williams has improved fevers over the last 24 hours and currently on Day 16 of broad spectrum antimicrobial therapy for peritonitis, Fusobacterium bacteremia and pneumonia following rectal perforation requiring partial colon resection and diverting colostomy. Current regimen of meropenem and linezolid covers usual intra-abdominal organisms including E. Coli, Enterobacter, Enterococcus and Klebsiella and anaerobes. She is certainly at higher risk for fungal infection, but does not appear to need fungal coverage at this time. Recommend continuing with current antibiotics given improvement in fever curve and agree with CT scan of the abdomen. Fevers appear likely related to her burden of infection at present.   1. Continue current linezolid and meropenem. 2. Await CT results. 3. Monitor cultures, fevers, and WBC count.    Principal Problem:   Fever Active Problems:   ARDS (adult respiratory distress syndrome) (HCC)   Acute respiratory failure with hypoxemia (HCC)   Stercoral ulcer of rectum   Rectal perforation (HCC)   Peritonitis (HCC)   Polymicrobial bacterial infection   Endotracheal tube present   History of ETT   Ileus ([place])   AKI (acute kidney injury) ([place])   . amiodarone  200 mg Oral BID  . chlorhexidine gluconate (MEDLINE KIT)  15 mL Mouth Rinse BID  . Chlorhexidine Gluconate Cloth  6 each Topical Daily  . clonazePAM  1 mg Per Tube BID  . diltiazem  30 mg Oral Q6H  . fentaNYL  75 mcg Transdermal Q72H  . guaiFENesin  30 mL Oral Q6H  . heparin  5,000 Units Subcutaneous Q8H  . mouth rinse  15 mL Mouth Rinse 10 times per day  . metoprolol tartrate  25 mg Oral BID  . morphine  15 mg Per Tube Q6H  . pantoprazole sodium  40 mg Per Tube Daily  . QUEtiapine  50 mg Oral BID  . sodium chloride flush  10-40 mL Intracatheter Q12H     SUBJECTIVE:  Low grade fever of 100.5 over the last 24 hours with stable WBC count of 14.3. Chest x-ray yesterday with increased pulmonary infiltrates. She was agitated overnight and actively vomiting tube feeds this morning. Repeat blood cultures drawn 08/31/18 without growth to date. Plan for CT scan today.   Allergies  Allergen Reactions  . Bee Venom Swelling     Review of Systems: Review of Systems  Unable to perform ROS: Intubated      OBJECTIVE: Vitals:   09/02/18 0800 09/02/18 0823 09/02/18 0824 09/02/18 0900  BP:  112/81  110/73  Pulse:  95    Resp: (!) 36 (!) 30  (!) 26  Temp:      TempSrc:      SpO2: 92% 95% 95% 95%  Weight:      Height:       Body mass index is 30.05 kg/m.  Physical Exam Constitutional:      General: She is not in acute distress.    Appearance: She is well-developed. She is ill-appearing.     Interventions: She is sedated, intubated and restrained.  Cardiovascular:     Rate and Rhythm: Normal rate and regular rhythm.     Heart sounds: Normal heart sounds.     Comments: PICC line in right upper extremity with dressing that is clean and dry. No evidence of infection.  Pulmonary:     Effort: Pulmonary  effort is normal. She is intubated.     Breath sounds: Normal breath sounds. No wheezing, rhonchi or rales.  Chest:     Chest wall: No tenderness.  Abdominal:     General: There is distension.     Palpations: There is no mass.     Tenderness: There is no abdominal tenderness. There is no guarding.     Comments: Surgical dressing is clean and dry. Pictures in general surgery note. JP drain remains to suction. Ostomy present on left with liquid brown stool in pouch.   Skin:    General: Skin is warm and dry.     Lab Results Lab Results  Component Value Date   WBC 14.3 (H) 09/02/2018   HGB 7.0 (L) 09/02/2018   HCT 23.5 (L) 09/02/2018   MCV 101.7 (H) 09/02/2018   PLT 482 (H) 09/02/2018    Lab Results  Component Value Date    CREATININE 6.32 (H) 09/02/2018   BUN 97 (H) 09/02/2018   NA 143 09/02/2018   K 3.5 09/02/2018   CL 116 (H) 09/02/2018   CO2 14 (L) 09/02/2018    Lab Results  Component Value Date   ALT 13 08/31/2018   AST 21 08/31/2018   ALKPHOS 60 08/31/2018   BILITOT 0.7 08/31/2018     Microbiology: Recent Results (from the past 240 hour(s))  Urine culture     Status: None   Collection Time: 08/23/18  5:36 PM  Result Value Ref Range Status   Specimen Description URINE, RANDOM  Final   Special Requests   Final    NONE Performed at [facility], [address], [place], [place] [address]    Culture NO GROWTH  Final   Report Status 08/24/2018 FINAL  Final  Culture, respiratory (tracheal aspirate)     Status: None   Collection Time: 08/23/18  5:36 PM  Result Value Ref Range Status   Specimen Description TRACHEAL ASPIRATE  Final   Special Requests NONE  Final   Gram Stain   Final    FEW WBC PRESENT,BOTH PMN AND MONONUCLEAR FEW YEAST WITH PSEUDOHYPHAE Performed at [facility], [place] [address], [place], [place] [address]    Culture FEW CANDIDA ALBICANS  Final   Report Status 08/25/2018 FINAL  Final  Culture, blood (routine x 2)     Status: None   Collection Time: 08/23/18  7:50 PM  Result Value Ref Range Status   Specimen Description BLOOD LEFT ANTECUBITAL  Final   Special Requests   Final    BOTTLES DRAWN AEROBIC AND ANAEROBIC Blood Culture adequate volume   Culture   Final    NO GROWTH 5 DAYS Performed at [facility], [place] [address] [place], [place] [address]    Report Status 08/28/2018 FINAL  Final  Culture, blood (routine x 2)     Status: None   Collection Time: 08/23/18 10:14 PM  Result Value Ref Range Status   Specimen Description BLOOD RIGHT ANTECUBITAL  Final   Special Requests   Final    BOTTLES DRAWN AEROBIC AND ANAEROBIC Blood Culture adequate volume   Culture   Final    NO GROWTH 5 DAYS Performed at [facility], [place] [address]  [place], [place] [address]    Report Status 08/28/2018 FINAL  Final  Culture, blood (Routine X 2) w Reflex to ID Panel     Status: None   Collection Time: 08/26/18  1:20 PM  Result Value  Ref Range Status   Specimen Description BLOOD BLOOD RIGHT HAND  Final   Special Requests AEROBIC BOTTLE ONLY Blood Culture adequate volume  Final   Culture NO GROWTH 5 DAYS  Final   Report Status 08/31/2018 FINAL  Final  Culture, blood (Routine X 2) w Reflex to ID Panel     Status: None   Collection Time: 08/26/18  1:20 PM  Result Value Ref Range Status   Specimen Description BLOOD BLOOD LEFT HAND  Final   Special Requests AEROBIC BOTTLE ONLY Blood Culture adequate volume  Final   Culture NO GROWTH 5 DAYS  Final   Report Status 08/31/2018 FINAL  Final  Culture, respiratory (non-expectorated)     Status: None   Collection Time: 08/26/18  1:33 PM  Result Value Ref Range Status   Specimen Description TRACHEAL ASPIRATE  Final   Special Requests NONE  Final   Gram Stain   Final    RARE WBC PRESENT, PREDOMINANTLY PMN RARE YEAST Performed at [facility], [place] [address], [place], [place] [address]    Culture FEW CANDIDA ALBICANS  Final   Report Status 08/29/2018 FINAL  Final  MRSA PCR Screening     Status: Abnormal   Collection Time: 08/28/18  8:21 AM  Result Value Ref Range Status   MRSA by PCR POSITIVE (A) NEGATIVE Final    Comment:        The GeneXpert MRSA Assay (FDA approved for NASAL specimens only), is one component of a comprehensive MRSA colonization surveillance program. It is not intended to diagnose MRSA infection nor to guide or monitor treatment for MRSA infections. RESULT CALLED TO, READ BACK BY AND VERIFIED WITH: Annette Stable RN 10:50 08/28/17 ([email or web address]) Performed at [facility], [place] [address] [place], [place] [address]   Culture, respiratory (non-expectorated)     Status: None (Preliminary result)   Collection Time: 08/30/18  9:21 AM  Result Value Ref Range Status    Specimen Description TRACHEAL ASPIRATE  Final   Special Requests NONE  Final   Gram Stain   Final    RARE WBC PRESENT, PREDOMINANTLY PMN RARE GRAM POSITIVE COCCI    Culture   Final    RARE YEAST IDENTIFICATION TO FOLLOW Performed at [facility], [place] [address] [place], [place] [address]    Report Status PENDING  Incomplete  Culture, blood (routine x 2)     Status: None (Preliminary result)   Collection Time: 08/31/18  4:48 PM  Result Value Ref Range Status   Specimen Description BLOOD LEFT ANTECUBITAL  Final   Special Requests   Final    BOTTLES DRAWN AEROBIC ONLY Blood Culture adequate volume   Culture   Final    NO GROWTH 2 DAYS Performed at [facility], [place] [address], [place], [place] [address]    Report Status PENDING  Incomplete  Culture, blood (routine x 2)     Status: None (Preliminary result)   Collection Time: 08/31/18  4:55 PM  Result Value Ref Range Status   Specimen Description BLOOD THUMB  Final   Special Requests   Final    BOTTLES DRAWN AEROBIC ONLY Blood Culture adequate volume   Culture   Final    NO GROWTH 2 DAYS Performed at [facility], [address], [place], [place] [address]    Report Status PENDING  Incomplete     Terri Piedra, [place] [facility] [place]  [phone] Pager  09/02/2018  9:28 AM

## 2018-09-02 NOTE — Progress Notes (Signed)
53 year old woman admitted to [facility] 12/24 with colonic perforation requiring colectomy, course complicated by aspiration pneumonia requiring mechanical ventilation 12/28 and bacteremia with fusobacterium and enterococcus, transferred to [facility] on 12/29 for management of ARDS. She has a history of chronic pain on opiates  She was switched to morphine IR 1/7 and has been more awake and interactive in the last 24 hours with less breakthrough agitation.  She tolerated some weaning on pressure support 15/5. Episode of vomiting today On exam-awake on fentanyl 75 mics and Precedex, follows one-step commands, distended abdomen with tenderness right lower quadrant, feces in ostomy bag, JP drain with minimal serosanguineous viscous fluid, midline incision has purulent base with some granulation tissue surrounding.  Labs show stable creatinine at 6.3, rising BUN, persistent mild leukocytosis, decreasing procalcitonin.  Chest x-ray personally reviewed which shows bilateral patchy infiltrates as prior with some consolidation in the right upper lobe  Impression/plan  Sepsis with persistent fever and leukocytosis-procalcitonin continues to decrease which is reassuring, continue meropenem and Zyvox for enterococcus and Fusobacterium bacteremia, repeat cultures negative so far  Given abdominal exam today, will pursue CT abdomen with oral contrast only, although appreciate that this may not show abdominal abscess well.  Acute hypoxic respiratory failure-as tolerated although limited by agitation and abdominal issue  Acute encephalopathy/chronic opiate use-morphine IR via tube seem to work well but now she is n.p.o., will continue fentanyl patch and fentanyl drip and use morphine 2 mg every 8 hours as needed. Continue Precedex, Seroquel and clonazepam will be on hold  AKI-some response to 80 of Lasix, remains nonoliguric, no acute indication for dialysis, will give 120 of Lasix today  Atrial  fibrillation/RVR-now in normal sinus rhythm, can probably stop amiodarone and keep low-dose metoprolol, discontinue Cardizem  Prognosis remains guarded, she is may still need tracheostomy on dialysis, try to have conversation with her off Precedex and she still indicates that she would not want to involve son in her care but POA undetermined  The patient is critically ill with multiple organ systems failure and requires high complexity decision making for assessment and support, frequent evaluation and titration of therapies, application of advanced monitoring technologies and extensive interpretation of multiple databases. Critical Care Time devoted to patient care services described in this note independent of APP/resident  time is 35 minutes.    Leanna Sato [place] Soho MD

## 2018-09-02 NOTE — Progress Notes (Signed)
[facility] Progress Note     Subjective: CC-  Patient actively vomiting tube feedings while I was in the room. Per RN she only had about 70-80cc output from colostomy over night. WBC still elevated 14.3, TMAX 100.6 last 24 hours.  Objective: Vital signs in last 24 hours: Temp:  [98.3 F (36.8 C)-100.5 F (38.1 C)] 98.3 F (36.8 C) (01/08 0406) Pulse Rate:  [83-97] 97 (01/07 2217) Resp:  [21-38] 22 (01/08 0600) BP: (85-134)/(55-83) 95/71 (01/08 0600) SpO2:  [92 %-100 %] 97 % (01/08 0600) FiO2 (%):  [30 %] 30 % (01/08 0400) Weight:  [81.9 kg] 81.9 kg (01/08 0500) Last BM Date: 09/01/18  Intake/Output from previous day: 01/07 0701 - 01/08 0700 In: 3130.8 [I.V.:682; NG/GT:1610; IV Piggyback:838.9] Out: 1520 [Urine:1325; Drains:35; Stool:160] Intake/Output this shift: No intake/output data recorded.  PE: Gen: Alert, NAD, on vent, opens eyes to verbal stimulus HEENT: EOM's intact, pupils equal and round Abd: Soft,mild distension,ostomy pink with improving area of slough at 3 o'clock position, yellow/brown stool in ostomy pouch, open midline incision with slough/necrosis at base/few sutures visible     Lab Results:  Recent Labs    09/01/18 0405 09/02/18 0458  WBC 15.5* 14.3*  HGB 7.2* 7.0*  HCT 23.7* 23.5*  PLT 434* 482*   BMET Recent Labs    09/01/18 0405 09/02/18 0458  NA 144 143  K 3.5 3.5  CL 117* 116*  CO2 16* 14*  GLUCOSE 111* 108*  BUN 88* 97*  CREATININE 6.46* 6.32*  CALCIUM 7.5* 7.4*   PT/INR No results for input(s): LABPROT, INR in the last 72 hours. CMP     Component Value Date/Time   NA 143 09/02/2018 0458   K 3.5 09/02/2018 0458   CL 116 (H) 09/02/2018 0458   CO2 14 (L) 09/02/2018 0458   GLUCOSE 108 (H) 09/02/2018 0458   BUN 97 (H) 09/02/2018 0458   CREATININE 6.32 (H) 09/02/2018 0458   CREATININE 0.72 04/28/2014 1201   CALCIUM 7.4 (L) 09/02/2018 0458   PROT 5.9 (L) 08/31/2018 0500   ALBUMIN 1.1 (L) 08/31/2018 0500     AST 21 08/31/2018 0500   ALT 13 08/31/2018 0500   ALKPHOS 60 08/31/2018 0500   BILITOT 0.7 08/31/2018 0500   GFRNONAA 7 (L) 09/02/2018 0458   GFRNONAA >89 04/28/2014 1201   GFRAA 8 (L) 09/02/2018 0458   GFRAA >89 04/28/2014 1201   Lipase     Component Value Date/Time   LIPASE 87 (H) 08/23/2018 1834       Studies/Results: Dg Chest 1 View  Result Date: 09/01/2018 CLINICAL DATA:  Respiratory failure, fever, intubation, hypertension, former smoker EXAM: CHEST  1 VIEW COMPARISON:  Portable exam 1133 hours compared to 08/31/2018 FINDINGS: Tip of endotracheal tube projects 3.0 cm above carina. Nasogastric tube extends into stomach. RIGHT arm PICC line tip projects over SVC. Enlargement of cardiac silhouette. Persistent pulmonary infiltrates bilaterally increased in RIGHT upper lobe since previous exam. No pleural effusion or pneumothorax. IMPRESSION: BILATERAL pulmonary infiltrates increased in RIGHT upper lobe since prior study. Electronically Signed   By: Lavonia Dana M.D.   On: 09/01/2018 12:13   Dg Chest 1 View  Result Date: 08/31/2018 CLINICAL DATA:  Pneumonia EXAM: CHEST  1 VIEW COMPARISON:  08/30/2018 FINDINGS: Endotracheal tube with tip 2.7 cm above the carina, stable. Right upper extremity PICC with tip at the lower SVC. An orogastric tube reaches the stomach. Patchy bilateral lung opacities that are unchanged. Stable heart size. No effusion  or pneumothorax. IMPRESSION: 1. Stable hardware positioning. 2. History of pneumonia with unchanged bilateral lung opacity. Electronically Signed   By: Monte Fantasia M.D.   On: 08/31/2018 09:20    Anti-infectives: Anti-infectives (From admission, onward)   Start     Dose/Rate Route Frequency Ordered Stop   08/30/18 1030  linezolid (ZYVOX) IVPB 600 mg     600 mg 300 mL/hr over 60 Minutes Intravenous Every 12 hours 08/30/18 0941     08/28/18 2000  DAPTOmycin (CUBICIN) 640 mg in sodium chloride 0.9 % IVPB  Status:  Discontinued     640  mg 225.6 mL/hr over 30 Minutes Intravenous Every 48 hours 08/28/18 1330 08/30/18 0941   08/28/18 1400  meropenem (MERREM) 1 g in sodium chloride 0.9 % 100 mL IVPB     1 g 200 mL/hr over 30 Minutes Intravenous Every 12 hours 08/28/18 1330     08/26/18 1030  DAPTOmycin (CUBICIN) 618.5 mg in sodium chloride 0.9 % IVPB  Status:  Discontinued     8 mg/kg  77.3 kg 224.7 mL/hr over 30 Minutes Intravenous Every 48 hours 08/26/18 0934 08/26/18 0935   08/26/18 1030  DAPTOmycin (CUBICIN) 620 mg in sodium chloride 0.9 % IVPB  Status:  Discontinued     620 mg 224.8 mL/hr over 30 Minutes Intravenous Every 48 hours 08/26/18 0935 08/27/18 1038   08/25/18 0300  vancomycin (VANCOCIN) IVPB 1000 mg/200 mL premix  Status:  Discontinued     1,000 mg 200 mL/hr over 60 Minutes Intravenous  Once 08/24/18 0122 08/24/18 1049   08/24/18 0100  vancomycin (VANCOCIN) IVPB 1000 mg/200 mL premix  Status:  Discontinued     1,000 mg 200 mL/hr over 60 Minutes Intravenous Every 12 hours 08/24/18 0045 08/24/18 0122   08/24/18 0100  piperacillin-tazobactam (ZOSYN) IVPB 2.25 g  Status:  Discontinued     2.25 g 100 mL/hr over 30 Minutes Intravenous Every 6 hours 08/24/18 0045 08/28/18 1304       Assessment/Plan Enterococcal bacteremia, fusobacterium and enterococcus/aspiration pneumonia-ID following HTN GERD Depression Alcohol abuse Chronic pain/narcotic dependence -On Morphine at home Former smoker VDRF/ARDS- aspiration pneumonia -CCM Acute renal failure-creatinine 4.64>>5.86>>5.73>>5.74>>6.46, nephrology following Anemia-H/H:7.2/23.7 today and s/p 1 unit PRBCs 1/6 Hypokalemia - K3.5  Perforated colon/rectum S/p exploratory laparotomy, resection distal sigmoid colon and proximal rectum, diverting colostomy, wound vac placement 12/25 Dr. Noberto Retort - POD14 -surgical path?  -wound vac changed to wet to dry 08/28/18 - continue JP drain and monitor output - last CT abdomen/pelvis 1/1  ID -vancomycin  12/30>>12/30;zosyn 12/30>>08/28/18,daptomycin 1/1>>1/5, Meropenem 1/3>> day#6, linezolid 1/5>>day#4 VTE -SCDs, sq heparin FEN -IVF, NPO/OGT, hold TF Foley -in place  Plan: CT abdomen/pelvis today. Hold tube feedings for now. ContinueTIDwet to dry dressingchanges.   LOS: 10 days    Wellington Hampshire , [facility] 09/02/2018, 7:34 AM Pager: [phone] Mon 7:00 am -11:30 AM Tues-Fri 7:00 am-4:30 pm Sat-Sun 7:00 am-11:30 am

## 2018-09-02 NOTE — Progress Notes (Signed)
[facility] Progress Note Patient Name: Julie Williams DOB: Jun 09, 1966 MRN: [id number]   Date of Service  09/02/2018  HPI/Events of Note  PT NPO due to nausea.  She remains on precedex.  Seroquel and klonopin held. Metoprolol PO held. Amiodarone held.   eICU Interventions  Metoprolol 2.5mg  q6hrs with holding parameters.     Intervention Category Minor Interventions: Other:  Elsie Lincoln 09/02/2018, 9:56 PM
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
## 2018-09-02 NOTE — Progress Notes (Signed)
CSW spoke with RN on yesterday who expressed possibly finding a number for pt's son. CSW advised RN to place number in chart so that MD could call. CSW spoke has still been unable to locate set information on pt's son. CSW was advised and did advised RN that per social work Optician, dispensing, if pt is in need of someone to make a medical decision, then two agreeing physicians could make the decision to perform needed medical care. CSW still has no further leads to son at this time.    Virgie Dad Kenzi Bardwell, MSW, [facility] Clinical Social Worker [phone]

## 2018-09-03 ENCOUNTER — Inpatient Hospital Stay (HOSPITAL_COMMUNITY): Payer: PPO

## 2018-09-03 ENCOUNTER — Other Ambulatory Visit: Payer: Self-pay | Admitting: Internal Medicine

## 2018-09-03 DIAGNOSIS — Z9049 Acquired absence of other specified parts of digestive tract: Secondary | ICD-10-CM

## 2018-09-03 DIAGNOSIS — R160 Hepatomegaly, not elsewhere classified: Secondary | ICD-10-CM

## 2018-09-03 LAB — GLUCOSE, CAPILLARY
GLUCOSE-CAPILLARY: 80 mg/dL (ref 70–99)
Glucose-Capillary: 75 mg/dL (ref 70–99)
Glucose-Capillary: 89 mg/dL (ref 70–99)
Glucose-Capillary: 85 mg/dL (ref 70–99)
Glucose-Capillary: 82 mg/dL (ref 70–99)
Glucose-Capillary: 93 mg/dL (ref 70–99)

## 2018-09-03 LAB — BASIC METABOLIC PANEL
Anion gap: 16 — ABNORMAL HIGH (ref 5–15)
BUN: 100 mg/dL — ABNORMAL HIGH (ref 6–20)
CHLORIDE: 111 mmol/L (ref 98–111)
CO2: 16 mmol/L — ABNORMAL LOW (ref 22–32)
Calcium: 7.9 mg/dL — ABNORMAL LOW (ref 8.9–10.3)
Creatinine, Ser: 6.77 mg/dL — ABNORMAL HIGH (ref 0.44–1.00)
GFR calc Af Amer: 7 mL/min — ABNORMAL LOW (ref >60)
GFR calc non Af Amer: 6 mL/min — ABNORMAL LOW (ref >60)
Glucose, Bld: 85 mg/dL (ref 70–99)
POTASSIUM: 4 mmol/L (ref 3.5–5.1)
Sodium: 143 mmol/L (ref 135–145)

## 2018-09-03 LAB — CBC
HEMATOCRIT: 23.3 % — AB (ref 36.0–46.0)
Hemoglobin: 7.2 g/dL — ABNORMAL LOW (ref 12.0–15.0)
MCH: 31.3 pg (ref 26.0–34.0)
MCHC: 30.9 g/dL (ref 30.0–36.0)
MCV: 101.3 fL — ABNORMAL HIGH (ref 80.0–100.0)
Platelets: 631 10*3/uL — ABNORMAL HIGH (ref 150–400)
RBC: 2.3 MIL/uL — ABNORMAL LOW (ref 3.87–5.11)
RDW: 16.9 % — ABNORMAL HIGH (ref 11.5–15.5)
WBC: 14.7 10*3/uL — ABNORMAL HIGH (ref 4.0–10.5)
nRBC: 0.1 % (ref 0.0–0.2)

## 2018-09-03 MED ORDER — SODIUM CHLORIDE 0.9 % IV SOLN: INTRAVENOUS | Status: DC

## 2018-09-03 MED ORDER — SODIUM CHLORIDE 0.9 % IV SOLN
1.0000 g | INTRAVENOUS | Status: DC
  Administered 2018-09-04 – 2018-09-07 (×4): 1 g via INTRAVENOUS
  Filled 2018-09-03 (×5): qty 1

## 2018-09-03 MED ORDER — VITAL AF 1.2 CAL PO LIQD
1000.0000 mL | ORAL | Status: DC
  Administered 2018-09-03: 1000 mL

## 2018-09-03 NOTE — Progress Notes (Signed)
Patient ID: Julie Williams, female   DOB: 1966/04/08, 53 y.o.   MRN: [id number] IR consulted for a hepatic abscess/collection.  I reviewed the recent CT and [place] and compared with multiple prior studies.  There appears to be complex subcapsular fluid around the liver that could be blood but infection cannot be excluded.  Anticipate image guided aspiration / drainage of perihepatic/subcapsular fluid.  Formal consult to follow.

## 2018-09-03 NOTE — Progress Notes (Signed)
53 year-old woman admitted to [facility] 12/24 with colonic perforation requiring colectomy, course complicated by aspiration pneumonia requiring mechanical ventilation 12/28 and bacteremia with fusobacterium and enterococcus, transferred to [facility] on 12/29 for management of ARDS. She has a history of chronic pain on opiates  1/7 switched to [facility], later to IV morphine 1/8 episode of vomiting feculent material, CT abdomen did not reveal any bowel pathology but showed hypodense lesion in the liver  Afebrile last 24 hours On exam-awake and able to mouth words, this is the calmest I have seen her entire week, minimal secretions, bilateral scattered crackles, S1-S2 tacky, soft mildly distended abdomen, JP drain with serosanguineous discharge  Chest x-ray personally reviewed which continue to show bilateral patchy infiltrates. Labs show creatinine increased to 6.7, stable leukocytosis at 14, mild anemia  Impression/plan  Sepsis with persistent fever and leukocytosis-continue meropenem and Zyvox for enterococcus and Fusobacterium bacteremia, will consult IR for aspiration of liver lesion which is likely liver abscess  Acute hypoxic respiratory failure-now tolerating weaning with pressure support 10/5, I introduced concept of tracheostomy to her , but I am somewhat hopeful we may be able to avoid this  Acute encephalopathy-much improved with addition of IV morphine, continue fentanyl patch and drip as needed with Precedex. Can discontinue Seroquel and clonazepam  AKI -nonoliguric with some response to 120 mg of Lasix, will hold off further diuretic.  To me she was agreeable to dialysis but to renal, she said that she would not want dialysis  Atrial fibrillation/RVR-discontinue amnio, continue low-dose propofol  Colostomy/rectal perforation-management per surgery, wound care, restart trickle tube feeds, [facility].  Prognosis guarded, she now indicates that she would be agreeable to  have her son involved in her care as POA should she be unable to make decisions.    The patient is critically ill with multiple organ systems failure and requires high complexity decision making for assessment and support, frequent evaluation and titration of therapies, application of advanced monitoring technologies and extensive interpretation of multiple databases. Critical Care Time devoted to patient care services described in this note independent of APP/resident  time is 35 minutes.   Leanna Sato Elsworth Soho MD

## 2018-09-03 NOTE — Progress Notes (Signed)
[facility]  Date of Admission:  08/23/2018     Total days of antibiotics 17         ASSESSMENT/PLAN  Julie Williams is currently on Day 17 of antimicrobial therapy with broad spectrum linezolid and meropenem for peritonitis, Fusobacterium/Enterococcus bacteremia and pneumonia following rectal perforation requiring partial colon resection and diverting colosotomy. Clinically appears to be improving and has been afebrile now for the past 24 hours which is encouraging. CT scan with possible hepatic abscess awaiting further evaluation with ultrasound. She declined TEE today. Now that she is afebrile will consider de-escalating antimicrobial therapy as she has received 17 days of total therapy so far. Will ensure she continues to remain fever free for now pending ultrasound results.  1. Continue linezolid and meropenem.  2. Monitor cultures, fevers and WBC count.  3. Weaning per CCM 4. Abdominal wound care per general surgery.    Principal Problem:   Fever Active Problems:   ARDS (adult respiratory distress syndrome) (HCC)   Acute respiratory failure with hypoxemia (HCC)   Stercoral ulcer of rectum   Rectal perforation (HCC)   Peritonitis (HCC)   Polymicrobial bacterial infection   Endotracheal tube present   History of ETT   Ileus ([place])   AKI (acute kidney injury) ([place])   . amiodarone  200 mg Oral BID  . chlorhexidine gluconate (MEDLINE KIT)  15 mL Mouth Rinse BID  . Chlorhexidine Gluconate Cloth  6 each Topical Daily  . clonazePAM  1 mg Per Tube BID  . fentaNYL  75 mcg Transdermal Q72H  . guaiFENesin  30 mL Oral Q6H  . heparin  5,000 Units Subcutaneous Q8H  . mouth rinse  15 mL Mouth Rinse 10 times per day  . metoprolol tartrate  2.5 mg Intravenous Q6H  .  morphine injection  2 mg Intravenous Q8H  . pantoprazole (PROTONIX) IV  40 mg Intravenous Q12H  . QUEtiapine  50 mg Oral BID  . sodium chloride flush  10-40 mL Intracatheter Q12H     SUBJECTIVE:  Afebrile in the last 24 hours with stable WBC count of 14.7. CT scan of the abdomen with worsening airspace consolidation indicative of pneumonia. There was possible hepatic abscess with ultrasound ordered for additional evaluation. Julie Williams declined TEE at the present time to rule out fungal endocarditis. May have possible ilieus.   Wanting ice chips.   Allergies  Allergen Reactions  . Bee Venom Swelling     Review of Systems: Review of Systems  Constitutional: Negative for chills, fever and weight loss.  Respiratory: Negative for cough, shortness of breath and wheezing.   Cardiovascular: Negative for chest pain and leg swelling.  Gastrointestinal: Negative for abdominal pain, constipation, diarrhea, nausea and vomiting.  Skin: Negative for rash.      OBJECTIVE: Vitals:   09/03/18 0700 09/03/18 0734 09/03/18 0800 09/03/18 0834  BP: 97/65  91/64 91/64  Pulse: 83  80 90  Resp: 15  16 (!) 22  Temp:  99.5 F (37.5 C)    TempSrc:  Oral    SpO2: 98%  98% 98%  Weight:      Height:       Body mass index is 28.21 kg/m.  Physical Exam Constitutional:      General: She is not in acute distress.    Appearance: She is well-developed.  Cardiovascular:     Rate and Rhythm: Normal rate and regular rhythm.     Heart sounds: Normal heart sounds.  Pulmonary:     Effort: Pulmonary effort is normal.     Breath sounds: Normal breath sounds.  Abdominal:     General: Abdomen is flat. There is distension.     Palpations: There is no mass.     Tenderness: There is no guarding or rebound.     Hernia: No hernia is present.     Comments: Midline surgical dressing is clean and dry. JP drain with serous drainage. Liquid brown stool in colostomy.   Skin:    General: Skin is warm and dry.  Neurological:     Mental Status: She is alert.     Lab Results Lab Results  Component Value Date   WBC 14.7 (H) 09/03/2018   HGB 7.2 (L) 09/03/2018   HCT 23.3 (L) 09/03/2018    MCV 101.3 (H) 09/03/2018   PLT 631 (H) 09/03/2018    Lab Results  Component Value Date   CREATININE 6.77 (H) 09/03/2018   BUN 100 (H) 09/03/2018   NA 143 09/03/2018   K 4.0 09/03/2018   CL 111 09/03/2018   CO2 16 (L) 09/03/2018    Lab Results  Component Value Date   ALT 13 08/31/2018   AST 21 08/31/2018   ALKPHOS 60 08/31/2018   BILITOT 0.7 08/31/2018     Microbiology: Recent Results (from the past 240 hour(s))  Culture, blood (Routine X 2) w Reflex to ID Panel     Status: None   Collection Time: 08/26/18  1:20 PM  Result Value Ref Range Status   Specimen Description BLOOD BLOOD RIGHT HAND  Final   Special Requests AEROBIC BOTTLE ONLY Blood Culture adequate volume  Final   Culture NO GROWTH 5 DAYS  Final   Report Status 08/31/2018 FINAL  Final  Culture, blood (Routine X 2) w Reflex to ID Panel     Status: None   Collection Time: 08/26/18  1:20 PM  Result Value Ref Range Status   Specimen Description BLOOD BLOOD LEFT HAND  Final   Special Requests AEROBIC BOTTLE ONLY Blood Culture adequate volume  Final   Culture NO GROWTH 5 DAYS  Final   Report Status 08/31/2018 FINAL  Final  Culture, respiratory (non-expectorated)     Status: None   Collection Time: 08/26/18  1:33 PM  Result Value Ref Range Status   Specimen Description TRACHEAL ASPIRATE  Final   Special Requests NONE  Final   Gram Stain   Final    RARE WBC PRESENT, PREDOMINANTLY PMN RARE YEAST Performed at [facility], [address] [place], [place] [address]    Culture FEW CANDIDA ALBICANS  Final   Report Status 08/29/2018 FINAL  Final  MRSA PCR Screening     Status: Abnormal   Collection Time: 08/28/18  8:21 AM  Result Value Ref Range Status   MRSA by PCR POSITIVE (A) NEGATIVE Final    Comment:        The GeneXpert MRSA Assay (FDA approved for NASAL specimens only), is one component of a comprehensive MRSA colonization surveillance program. It is not intended to diagnose MRSA infection nor to  guide or monitor treatment for MRSA infections. RESULT CALLED TO, READ BACK BY AND VERIFIED WITH: Annette Stable RN 10:50 08/28/17 ([email or web address]) Performed at [facility], [place] [address] [place], [place] [address]   Culture, respiratory (non-expectorated)     Status: None   Collection Time: 08/30/18  9:21 AM  Result Value Ref Range Status   Specimen Description TRACHEAL  ASPIRATE  Final   Special Requests NONE  Final   Gram Stain   Final    RARE WBC PRESENT, PREDOMINANTLY PMN RARE GRAM POSITIVE COCCI Performed at [facility], [place] [address], [place], [place] [address]    Culture RARE CANDIDA ALBICANS  Final   Report Status 09/02/2018 FINAL  Final  Culture, blood (routine x 2)     Status: None (Preliminary result)   Collection Time: 08/31/18  4:48 PM  Result Value Ref Range Status   Specimen Description BLOOD LEFT ANTECUBITAL  Final   Special Requests   Final    BOTTLES DRAWN AEROBIC ONLY Blood Culture adequate volume   Culture   Final    NO GROWTH 3 DAYS Performed at [facility], [place] [address], [place], [place] [address]    Report Status PENDING  Incomplete  Culture, blood (routine x 2)     Status: None (Preliminary result)   Collection Time: 08/31/18  4:55 PM  Result Value Ref Range Status   Specimen Description BLOOD THUMB  Final   Special Requests   Final    BOTTLES DRAWN AEROBIC ONLY Blood Culture adequate volume   Culture   Final    NO GROWTH 3 DAYS Performed at [facility], [address], [place], [place] [address]    Report Status PENDING  Incomplete     Terri Piedra, [place] Pager  09/03/2018  9:59 AM

## 2018-09-03 NOTE — Progress Notes (Signed)
[facility] Progress Note    Assessment/ Plan:   53y/o female with a complicated history with recent rectal/colonic  perforation with colostomy  resultant aspiration and ARDS, transferred here on 08/23/2018 for critical care management. Also bacteremia with fusobacterium and enterococcus. Has been tx with vanco, zosyn and meropenem for the peritonitis -> Linezolid + Meropenem.   1. Baseline creatinine 0.89 10/2015 2. AKI. Likely due to ATN in the setting of vanco/zosyn, shock, and MOSF. Non-oliguric with good urine output, and no evidence of obstruction from CT on 08/26/18. No need for HD. Would continue supportive care.  Creatinine elevated and not improving.  - She doesn't have any absolute indications for dialysis and she also does not want dialysis if she were to require it.  I asked her this a few times with the nurse present and she's very clear that she does not want dialysis.  - She is responding to IV Lasix.  3. ARDS. Continue vent management.   Oxygenation is stable.  Continue negative fluid balance. 4. Acute Encephalopathy. Minimize sedation. 5. Peritonitis.   Antibiotics per ID.  Subjective:   Denies f/c/n/v.  Currently on a vent. Also denies abd or CP   Objective:   BP 136/85   Pulse (!) 114   Temp 99.3 F (37.4 C) (Oral)   Resp (!) 25   Ht 5' 5"  (1.651 m)   Wt 76.9 kg   SpO2 97%   BMI 28.21 kg/m   Intake/Output Summary (Last 24 hours) at 09/03/2018 1236 Last data filed at 09/03/2018 1200 Gross per 24 hour  Intake 1458.48 ml  Output 3405 ml  Net -1946.52 ml   Weight change: -5 kg  Physical Exam: General:  NAD, intubated but alert  Neck:  No JVD, no adenopathy CV:  Heart RRR  Lungs:  L/S coarse bilaterally. Abd:  abd mildly distended Extremities: (+)1 bilateral LE edema. Skin:  No skin rash  Imaging: Ct Abdomen Pelvis Wo Contrast  Result Date: 09/02/2018 CLINICAL DATA:  Abdominal pain and fever.  Possible abscess. EXAM: CT  ABDOMEN AND PELVIS WITHOUT CONTRAST TECHNIQUE: Multidetector CT imaging of the abdomen and pelvis was performed following the standard protocol without IV contrast. COMPARISON:  08/26/2018. FINDINGS: Lower chest: Patchy airspace consolidation and ground-glass in the right upper lobe, right middle lobe, lingula and both lower lobes, progressive from 08/26/2018. Small bilateral pleural effusions, similar. Heart is at the upper limits of normal in size to mildly enlarged. No pericardial effusion. Decreased attenuation of the intravascular compartment is indicative of anemia. Nasogastric tube is seen in the distal esophagus. Hepatobiliary: Liver appears heterogeneous. Low-attenuation lesion in the posterior right hepatic lobe measures 3.1 cm and may be new from 08/23/2018. Gallbladder is decompressed. No definite biliary ductal dilatation. Pancreas: Negative. Spleen: Negative. Adrenals/Urinary Tract: Adrenal glands and kidneys are unremarkable. Ureters are decompressed. Foley catheter seen in the bladder. Stomach/Bowel: Nasogastric tube terminates in the stomach. Mild nonspecific gaseous distension of small bowel in the left anatomic pelvis. Left lower quadrant colostomy. Colon is otherwise grossly unremarkable. Vascular/Lymphatic: Minimal atherosclerotic calcification of the distal aorta. No pathologically enlarged lymph nodes. Reproductive: Uterus appears to be surgically absent. No adnexal mass. Other: Moderate ascites, increased. Peritoneal dialysis catheter terminates in the right adnexa. Open ventral midline abdominal wound. Diffuse body wall edema. Musculoskeletal: No worrisome lytic or sclerotic lesions. IMPRESSION: 1. Worsening airspace consolidation and ground-glass in the lung bases, most indicative of pneumonia. Associated small bilateral pleural effusions. 2. Hepatic steatosis. Possible area of new low-attenuation  in the posterior right hepatic lobe. Assessment is difficult without IV contrast. Abscess is  not excluded. Sonographic evaluation to assess for hyperemia may be useful, as clinically indicated. 3. Moderate ascites, increased from 08/26/2018, with peritoneal dialysis catheter in place. Electronically Signed   By: Lorin Picket M.D.   On: 09/02/2018 15:36   Dg Chest Port 1 View  Result Date: 09/03/2018 CLINICAL DATA:  Respiratory failure. Sepsis. History of colonic perforation. EXAM: PORTABLE CHEST 1 VIEW COMPARISON:  09/01/2018. FINDINGS: Endotracheal tube, NG tube, right PICC line stable position. Cardiomegaly with bilateral pulmonary infiltrates again noted. No significant change noted from prior exam. Mild bibasilar atelectasis. Small right pleural effusion can not be excluded. No pneumothorax. Prior cervical spine fusion. IMPRESSION: 1.  Lines and tubes in stable position. 2. Bilateral pulmonary infiltrates are again noted. Bibasilar atelectasis. Similar findings noted on prior exam. 3.  Stable cardiomegaly. Electronically Signed   By: Marcello Moores  Register   On: 09/03/2018 07:56   US Abdomen Limited Ruq  Result Date: 09/03/2018 CLINICAL DATA:  Abdominal pain. EXAM: ULTRASOUND ABDOMEN LIMITED RIGHT UPPER QUADRANT COMPARISON:  Body CT 09/02/2018 FINDINGS: Gallbladder: The gallbladder is decompressed. The gallbladder wall measures up to 5.5 mm in thickness, possibly exaggerated by collapsed state. No sonographic Murphy sign noted by sonographer. There is a small amount of pericholecystic fluid. Common bile duct: Diameter: 6.3 mm. Liver: Diffusely heterogeneous echogenicity of the liver. Nodular contour. There is a hypoechoic mass in the subcapsular portion of the inferior right lower lobe of the liver which measures 5.1 x 1.2 x 1.8 cm. In no increased vascularity to this mass is seen. Portal vein is patent on color Doppler imaging with normal direction of blood flow towards the liver. Perihepatic ascites are seen. IMPRESSION: Decompressed gallbladder with probably artifactual thickening of the  gallbladder wall measuring up to 5.5 mm. Pericholecystic fluid may be secondary to presence of ascites. Mild distension of the common bile duct measuring up to 6.3 mm. Proximal common bile duct obstruction can not be entirely excluded. Nodular liver contour with diffusely heterogeneous echogenicity, likely due to chronic liver disease. Large hypoechoic mass in the inferior right lobe of the liver. This may represent a liver abscess or subcapsular hematoma versus a true hepatic mass. Evaluation with MRI of the abdomen with contrast, when clinically feasible may be considered. Electronically Signed   By: Fidela Salisbury M.D.   On: 09/03/2018 12:12    Labs: BMET Recent Labs  Lab 08/28/18 0420 08/29/18 0445 08/30/18 0425 08/31/18 0500 09/01/18 0405 09/02/18 0458 09/03/18 0500  NA 141 141 142 140 144 143 143  K 3.2* 3.2* 3.0* 3.4* 3.5 3.5 4.0  CL 105 106 111 112* 117* 116* 111  CO2 22 19* 18* 16* 16* 14* 16*  GLUCOSE 110* 94 122* 108* 111* 108* 85  BUN 70* 75* 75* 77* 88* 97* 100*  CREATININE 5.47* 5.86* 5.73* 5.74* 6.46* 6.32* 6.77*  CALCIUM 7.5* 7.5* 7.5* 7.0* 7.5* 7.4* 7.9*  PHOS  --   --   --   --   --  7.5*  --    CBC Recent Labs  Lab 08/29/18 1101 08/30/18 0425 08/31/18 0500 09/01/18 0405 09/02/18 0458 09/03/18 0500  WBC 11.1* 11.1* 13.9* 15.5* 14.3* 14.7*  NEUTROABS 9.3* 9.0* 11.4*  --   --   --   HGB 7.2* 7.3* 6.8* 7.2* 7.0* 7.2*  HCT 22.1* 22.8* 21.3* 23.7* 23.5* 23.3*  MCV 94.8 95.0 100.5* 99.6 101.7* 101.3*  PLT 383 438* 406*  434* 482* 631*    Medications:    . amiodarone  200 mg Oral BID  . chlorhexidine gluconate (MEDLINE KIT)  15 mL Mouth Rinse BID  . Chlorhexidine Gluconate Cloth  6 each Topical Daily  . fentaNYL  75 mcg Transdermal Q72H  . guaiFENesin  30 mL Oral Q6H  . heparin  5,000 Units Subcutaneous Q8H  . mouth rinse  15 mL Mouth Rinse 10 times per day  . metoprolol tartrate  2.5 mg Intravenous Q6H  .  morphine injection  2 mg Intravenous Q8H  .  pantoprazole (PROTONIX) IV  40 mg Intravenous Q12H  . sodium chloride flush  10-40 mL Intracatheter Q12H      Otelia Santee, MD 09/03/2018, 12:36 PM

## 2018-09-03 NOTE — Progress Notes (Addendum)
    Emergent Implied Consent for TEE  Pt is critically ill I've reviewed the notes from PCCM. 53 yo with perforated colon / rectum with multiple complications Bacteremia with fusobacterium and enterococcus, Remains critically ill,   The critical care team has requested a TEE to evaluate for the possiblity of endocarditis , fungal infection  Multiple attempts have been made to discuss the TEE with her family but no family can be reached.    She has multi-organ failure,  Is on a vent,  Has renal failure,  Her prognosis is very poor.  I agree that a diagnostic TEE for the purposes of evaluation for fungal endocarditis is in her best interest . Tube feedings have been held.    Mertie Moores, MD  09/03/2018 8:56 AM    [facility],  [place], [place]  [address] Pager [phone] Phone: [phone]; Fax: [phone]   Addendum   I have talked with the patient this am  She is more awake this am. She shook her head when we asked if she would agree to the TEE. I explained that this would be beneficial for her evaluation but still she did not agree to the procedure.  Discussed with dr. Elsworth Soho.  Will postpone the TEE for now     Mertie Moores, MD  09/03/2018 9:26 [place],  [place], [place]  [address] Pager [phone] Phone: [phone]; Fax: [phone]

## 2018-09-03 NOTE — Progress Notes (Signed)
Pt placed back on full vent support at this time due to increased WOB & increased RR >35.  Pt tolerating well, RT will monitor

## 2018-09-03 NOTE — Progress Notes (Signed)
Pharmacy Antibiotic Note  Julie Williams is a 53 y.o. female admitted on 08/23/2018 with peritonitis, uremia, enterococcus and fusobacterium.  Pharmacy has been consulted for Zyvox and meropenem dosing. WBC 15, t-MAX 99.5, have had some fevers past few days. Patient's renal function has significantly decreased. Will change meropenem to every 24 hours. Patient has slightly improved today when rounding with CCM team. Will continue to monitor closely.  Plan: Continue Zyvox 600mg  every 12 hours Continue Meropenem 1000mg  IV every 24 hours Follow-up clinical improvement, signs/symptoms of infection, CBC, SCR, fever.  Height: 5\' 5"  (165.1 cm) Weight: 169 lb 8.5 oz (76.9 kg) IBW/kg (Calculated) : 57  Temp (24hrs), Avg:98.6 F (37 C), Min:97.9 F (36.6 C), Max:99.5 F (37.5 C)  Recent Labs  Lab 08/30/18 0425 08/31/18 0500 09/01/18 0405 09/02/18 0458 09/03/18 0500  WBC 11.1* 13.9* 15.5* 14.3* 14.7*  CREATININE 5.73* 5.74* 6.46* 6.32* 6.77*    Estimated Creatinine Clearance: 9.7 mL/min (A) (by C-G formula based on SCr of 6.77 mg/dL (H)).    Allergies  Allergen Reactions  . Bee Venom Swelling    Antimicrobials this admission:   Zosyn12/30 >> 1/3   Daptomycin1/1 x1,1/3 >>1/5   Vancomycin 12/30 >> 12/30   Meropenem 1/3 >>   Zyvox 1/5>>   Microbiology results: Outside Hospital ([facility])cultures:   12/25 blood cx 4/4: enterococcus faecalis/fusobacterium nucleatum [facility]:   12/29 blood cx>> NGTD   12/29 urine cx>> negative   12/29 resp cx>>yeast   1/3 MRSA PCR >> positive   1/1 TA >> few candida albicans    1/1 BCx >> negative   1/6 rBCx >> NGTD   Thank you for allowing pharmacy to be a part of this patient's care.  Tamela Gammon, PharmD 09/03/2018 4:52 PM PGY-1 Pharmacy Resident Direct Phone: [phone] Please check [email or web address] for unit-specific pharmacist phone numbers
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
## 2018-09-03 NOTE — Progress Notes (Signed)
[facility] Progress Note     Subjective: CC-  No more n/v. Patient with 2200cc out of OG, and 275cc from ostomy. CT yesterday showed worsening pneumonia, no definite intraabdominal abscess or SBO; cannot rule out liver abscess. She likely has an ileus.  WBC 14.7, TMAX 99.5. Creatinine still rising, 6.77.  Objective: Vital signs in last 24 hours: Temp:  [97.8 F (36.6 C)-99.5 F (37.5 C)] 99.5 F (37.5 C) (01/09 0734) Pulse Rate:  [73-130] 83 (01/09 0700) Resp:  [15-34] 15 (01/09 0700) BP: (89-138)/(56-84) 97/65 (01/09 0700) SpO2:  [93 %-100 %] 98 % (01/09 0700) FiO2 (%):  [30 %] 30 % (01/09 0600) Weight:  [76.9 kg] 76.9 kg (01/09 0257) Last BM Date: 09/02/18  Intake/Output from previous day: 01/08 0701 - 01/09 0700 In: 2095.5 [I.V.:643.8; NG/GT:580; IV Piggyback:871.7] Out: 0350 [Urine:1700; Emesis/NG output:2200; Drains:10; Stool:275] Intake/Output this shift: No intake/output data recorded.  PE: Gen: Alert, NAD, on vent HEENT: EOM's intact, pupils equal and round Abd: Soft,mild distension, does not seem tender, few BS heard,ostomy pink withimprovingarea of slough at 3 o'clock position and small amount liquid yellow/brown stool in ostomy pouch, open midline incision with slough/necrosis at base/few sutures visible, JP drain SS     Lab Results:  Recent Labs    09/02/18 0458 09/03/18 0500  WBC 14.3* 14.7*  HGB 7.0* 7.2*  HCT 23.5* 23.3*  PLT 482* 631*   BMET Recent Labs    09/02/18 0458 09/03/18 0500  NA 143 143  K 3.5 4.0  CL 116* 111  CO2 14* 16*  GLUCOSE 108* 85  BUN 97* 100*  CREATININE 6.32* 6.77*  CALCIUM 7.4* 7.9*   PT/INR No results for input(s): LABPROT, INR in the last 72 hours. CMP     Component Value Date/Time   NA 143 09/03/2018 0500   K 4.0 09/03/2018 0500   CL 111 09/03/2018 0500   CO2 16 (L) 09/03/2018 0500   GLUCOSE 85 09/03/2018 0500   BUN 100 (H) 09/03/2018 0500   CREATININE 6.77 (H) 09/03/2018 0500   CREATININE 0.72 04/28/2014 1201   CALCIUM 7.9 (L) 09/03/2018 0500   PROT 5.9 (L) 08/31/2018 0500   ALBUMIN 1.1 (L) 08/31/2018 0500   AST 21 08/31/2018 0500   ALT 13 08/31/2018 0500   ALKPHOS 60 08/31/2018 0500   BILITOT 0.7 08/31/2018 0500   GFRNONAA 6 (L) 09/03/2018 0500   GFRNONAA >89 04/28/2014 1201   GFRAA 7 (L) 09/03/2018 0500   GFRAA >89 04/28/2014 1201   Lipase     Component Value Date/Time   LIPASE 87 (H) 08/23/2018 1834       Studies/Results: Ct Abdomen Pelvis Wo Contrast  Result Date: 09/02/2018 CLINICAL DATA:  Abdominal pain and fever.  Possible abscess. EXAM: CT ABDOMEN AND PELVIS WITHOUT CONTRAST TECHNIQUE: Multidetector CT imaging of the abdomen and pelvis was performed following the standard protocol without IV contrast. COMPARISON:  08/26/2018. FINDINGS: Lower chest: Patchy airspace consolidation and ground-glass in the right upper lobe, right middle lobe, lingula and both lower lobes, progressive from 08/26/2018. Small bilateral pleural effusions, similar. Heart is at the upper limits of normal in size to mildly enlarged. No pericardial effusion. Decreased attenuation of the intravascular compartment is indicative of anemia. Nasogastric tube is seen in the distal esophagus. Hepatobiliary: Liver appears heterogeneous. Low-attenuation lesion in the posterior right hepatic lobe measures 3.1 cm and may be new from 08/23/2018. Gallbladder is decompressed. No definite biliary ductal dilatation. Pancreas: Negative. Spleen: Negative. Adrenals/Urinary Tract: Adrenal glands  and kidneys are unremarkable. Ureters are decompressed. Foley catheter seen in the bladder. Stomach/Bowel: Nasogastric tube terminates in the stomach. Mild nonspecific gaseous distension of small bowel in the left anatomic pelvis. Left lower quadrant colostomy. Colon is otherwise grossly unremarkable. Vascular/Lymphatic: Minimal atherosclerotic calcification of the distal aorta. No pathologically enlarged lymph  nodes. Reproductive: Uterus appears to be surgically absent. No adnexal mass. Other: Moderate ascites, increased. Peritoneal dialysis catheter terminates in the right adnexa. Open ventral midline abdominal wound. Diffuse body wall edema. Musculoskeletal: No worrisome lytic or sclerotic lesions. IMPRESSION: 1. Worsening airspace consolidation and ground-glass in the lung bases, most indicative of pneumonia. Associated small bilateral pleural effusions. 2. Hepatic steatosis. Possible area of new low-attenuation in the posterior right hepatic lobe. Assessment is difficult without IV contrast. Abscess is not excluded. Sonographic evaluation to assess for hyperemia may be useful, as clinically indicated. 3. Moderate ascites, increased from 08/26/2018, with peritoneal dialysis catheter in place. Electronically Signed   By: Lorin Picket M.D.   On: 09/02/2018 15:36   Dg Chest 1 View  Result Date: 09/01/2018 CLINICAL DATA:  Respiratory failure, fever, intubation, hypertension, former smoker EXAM: CHEST  1 VIEW COMPARISON:  Portable exam 1133 hours compared to 08/31/2018 FINDINGS: Tip of endotracheal tube projects 3.0 cm above carina. Nasogastric tube extends into stomach. RIGHT arm PICC line tip projects over SVC. Enlargement of cardiac silhouette. Persistent pulmonary infiltrates bilaterally increased in RIGHT upper lobe since previous exam. No pleural effusion or pneumothorax. IMPRESSION: BILATERAL pulmonary infiltrates increased in RIGHT upper lobe since prior study. Electronically Signed   By: Lavonia Dana M.D.   On: 09/01/2018 12:13   Dg Chest Port 1 View  Result Date: 09/03/2018 CLINICAL DATA:  Respiratory failure. Sepsis. History of colonic perforation. EXAM: PORTABLE CHEST 1 VIEW COMPARISON:  09/01/2018. FINDINGS: Endotracheal tube, NG tube, right PICC line stable position. Cardiomegaly with bilateral pulmonary infiltrates again noted. No significant change noted from prior exam. Mild bibasilar atelectasis.  Small right pleural effusion can not be excluded. No pneumothorax. Prior cervical spine fusion. IMPRESSION: 1.  Lines and tubes in stable position. 2. Bilateral pulmonary infiltrates are again noted. Bibasilar atelectasis. Similar findings noted on prior exam. 3.  Stable cardiomegaly. Electronically Signed   By: Marcello Moores  Register   On: 09/03/2018 07:56    Anti-infectives: Anti-infectives (From admission, onward)   Start     Dose/Rate Route Frequency Ordered Stop   08/30/18 1030  linezolid (ZYVOX) IVPB 600 mg     600 mg 300 mL/hr over 60 Minutes Intravenous Every 12 hours 08/30/18 0941     08/28/18 2000  DAPTOmycin (CUBICIN) 640 mg in sodium chloride 0.9 % IVPB  Status:  Discontinued     640 mg 225.6 mL/hr over 30 Minutes Intravenous Every 48 hours 08/28/18 1330 08/30/18 0941   08/28/18 1400  meropenem (MERREM) 1 g in sodium chloride 0.9 % 100 mL IVPB     1 g 200 mL/hr over 30 Minutes Intravenous Every 12 hours 08/28/18 1330     08/26/18 1030  DAPTOmycin (CUBICIN) 618.5 mg in sodium chloride 0.9 % IVPB  Status:  Discontinued     8 mg/kg  77.3 kg 224.7 mL/hr over 30 Minutes Intravenous Every 48 hours 08/26/18 0934 08/26/18 0935   08/26/18 1030  DAPTOmycin (CUBICIN) 620 mg in sodium chloride 0.9 % IVPB  Status:  Discontinued     620 mg 224.8 mL/hr over 30 Minutes Intravenous Every 48 hours 08/26/18 0935 08/27/18 1038   08/25/18 0300  vancomycin (VANCOCIN)  IVPB 1000 mg/200 mL premix  Status:  Discontinued     1,000 mg 200 mL/hr over 60 Minutes Intravenous  Once 08/24/18 0122 08/24/18 1049   08/24/18 0100  vancomycin (VANCOCIN) IVPB 1000 mg/200 mL premix  Status:  Discontinued     1,000 mg 200 mL/hr over 60 Minutes Intravenous Every 12 hours 08/24/18 0045 08/24/18 0122   08/24/18 0100  piperacillin-tazobactam (ZOSYN) IVPB 2.25 g  Status:  Discontinued     2.25 g 100 mL/hr over 30 Minutes Intravenous Every 6 hours 08/24/18 0045 08/28/18 1304       Assessment/Plan Enterococcal  bacteremia,fusobacterium and enterococcus/aspiration pneumonia-ID following HTN GERD Depression Alcohol abuse Chronic pain/narcotic dependence -On Morphine at home Former smoker VDRF/ARDS- aspiration pneumonia -CCM Acute renal failure-creatinine 5.86>>5.73>>5.74>>6.46>>6.77, nephrology following Anemia-H/H:7.2/23.3today and s/p1 unit PRBCs1/6 Hypokalemia -K4  Perforated colon/rectum S/p exploratory laparotomy, resection distal sigmoid colon and proximal rectum, diverting colostomy, wound vac placement 12/25 Dr. Noberto Retort - POD15 -surgical path?  -wound vac changed to wet to dry TID 08/28/18 - continue JP drain and monitor output - CT scan 1/8 worsening pneumonia, no definite intraabdominal abscess or SBO; cannot rule out liver abscess  ID -vancomycin 12/30>>12/30;zosyn 12/30>>08/28/18,daptomycin 1/1>>1/5, Meropenem 1/3>> day#7, linezolid 1/5>>day#5 VTE -SCDs, sq heparin FEN -IVF, NPO/OGT, hold TF Foley -in place  Plan: Continue OG to LIWS and hold tube feedings until ileus resolves (ie OG output decreases and ostomy output increases). Agree with u/s of liver to evaluate for possible abscess. ContinueTIDwet to dry dressingchanges.   LOS: 11 days    Wellington Hampshire , [facility] 09/03/2018, 8:08 AM Pager: [phone] Mon 7:00 am -11:30 AM Tues-Fri 7:00 am-4:30 pm Sat-Sun 7:00 am-11:30 am

## 2018-09-03 NOTE — Progress Notes (Addendum)
1:14pm-CSW was informed that this address is not associated with pt's son. Pt expressed that her sons name is Ileene Patrick. CSW to keep trying to locate son.   1:07pm-CSW informed by RN that pt expressed that her sons name is Tyler Pita and he lives in [place], [place]. [place] has reached out to the [facility] to see if they have a record of anyone by this name. CSW was given the address of [place] from [employer]. CSW provided this to the communications worker at this time.   1:01pm-CSW spoke wit RN who informed CSW that per pt she would like for her attorney Locke Clifft to be contacted. CSW was unable to speak with attorney [phone], [phone] (left voicemail at this number), [phone]. CSW made are that pt is requesting that son be contacted but has been unable to provide any further information at this time .  11:13am-CSW spoke with Lelon Frohlich from [place] earlier this morning. CSW was advised that they have no previous reports made on pt therefore no other information in their system can be given to CSW. Lelon Frohlich was able to tell CSW back there was no trace of a son for pt or any other contact. CSW spoke with RN and was informed that pt is more alert this morning fn pt gave the name of Ernest Haber. CSW reached out to [employer] and was informed that they do not have anyone in their system by this name or close to this name.   CSW still unable to locate son. CSW called number [phone] (number which was provided by RN for possible son) with no luck. Number is no longer a working number per recording.   CSW aware that pt is needing a decision maker as pt's medical needs have increased. CSW advised RN on Monday that  per social work leadership, if pt is in need of someone to make a medical decision, then two agreeing physicians could make the decision to perform needed medical care.   CSW to make one last attempt  to gather information on son via [place].     Virgie Dad Winslow Verrill, MSW, [facility] Clinical Social Worker [phone]

## 2018-09-04 DIAGNOSIS — K750 Abscess of liver: Secondary | ICD-10-CM

## 2018-09-04 DIAGNOSIS — Z7189 Other specified counseling: Secondary | ICD-10-CM

## 2018-09-04 DIAGNOSIS — Z515 Encounter for palliative care: Secondary | ICD-10-CM

## 2018-09-04 LAB — BASIC METABOLIC PANEL
Anion gap: 17 — ABNORMAL HIGH (ref 5–15)
BUN: 99 mg/dL — ABNORMAL HIGH (ref 6–20)
CO2: 15 mmol/L — AB (ref 22–32)
Calcium: 7.9 mg/dL — ABNORMAL LOW (ref 8.9–10.3)
Chloride: 113 mmol/L — ABNORMAL HIGH (ref 98–111)
Creatinine, Ser: 6.72 mg/dL — ABNORMAL HIGH (ref 0.44–1.00)
GFR calc Af Amer: 7 mL/min — ABNORMAL LOW (ref >60)
GFR calc non Af Amer: 6 mL/min — ABNORMAL LOW (ref >60)
Glucose, Bld: 95 mg/dL (ref 70–99)
POTASSIUM: 4.1 mmol/L (ref 3.5–5.1)
Sodium: 145 mmol/L (ref 135–145)

## 2018-09-04 LAB — GLUCOSE, CAPILLARY
Glucose-Capillary: 79 mg/dL (ref 70–99)
Glucose-Capillary: 95 mg/dL (ref 70–99)
Glucose-Capillary: 117 mg/dL — ABNORMAL HIGH (ref 70–99)
Glucose-Capillary: 76 mg/dL (ref 70–99)

## 2018-09-04 LAB — CBC
HEMATOCRIT: 23.2 % — AB (ref 36.0–46.0)
HEMOGLOBIN: 7.2 g/dL — AB (ref 12.0–15.0)
MCH: 31.7 pg (ref 26.0–34.0)
MCHC: 31 g/dL (ref 30.0–36.0)
MCV: 102.2 fL — ABNORMAL HIGH (ref 80.0–100.0)
Platelets: 717 10*3/uL — ABNORMAL HIGH (ref 150–400)
RBC: 2.27 MIL/uL — ABNORMAL LOW (ref 3.87–5.11)
RDW: 16.8 % — ABNORMAL HIGH (ref 11.5–15.5)
WBC: 16.2 10*3/uL — ABNORMAL HIGH (ref 4.0–10.5)
nRBC: 0 % (ref 0.0–0.2)

## 2018-09-04 LAB — FERRITIN: Ferritin: 2141 ng/mL — ABNORMAL HIGH (ref 11–307)

## 2018-09-04 MED ORDER — VENLAFAXINE HCL 37.5 MG PO TABS
37.5000 mg | ORAL_TABLET | Freq: Two times a day (BID) | ORAL | Status: DC
  Administered 2018-09-04 – 2018-09-05 (×3): 37.5 mg
  Filled 2018-09-04 (×5): qty 1

## 2018-09-04 MED ORDER — MORPHINE SULFATE 15 MG PO TABS: 15.0000 mg | ORAL_TABLET | Freq: Three times a day (TID) | ORAL | Status: DC

## 2018-09-04 MED ORDER — MORPHINE SULFATE (PF) 2 MG/ML IV SOLN
1.0000 mg | INTRAVENOUS | Status: DC | PRN
  Administered 2018-09-04 – 2018-09-05 (×5): 2 mg via INTRAVENOUS
  Administered 2018-09-08: 1 mg via INTRAVENOUS
  Filled 2018-09-04 (×6): qty 1

## 2018-09-04 MED ORDER — MORPHINE SULFATE (PF) 2 MG/ML IV SOLN
2.0000 mg | Freq: Three times a day (TID) | INTRAVENOUS | Status: DC
  Administered 2018-09-04 – 2018-09-05 (×5): 2 mg via INTRAVENOUS
  Filled 2018-09-04 (×5): qty 1

## 2018-09-04 MED ORDER — DEXMEDETOMIDINE HCL IN NACL 400 MCG/100ML IV SOLN: 0.4000 ug/kg/h | INTRAVENOUS | Status: DC

## 2018-09-04 MED ORDER — VENLAFAXINE HCL 37.5 MG PO TABS: 37.5000 mg | ORAL_TABLET | Freq: Every day | ORAL | Status: DC

## 2018-09-04 NOTE — Progress Notes (Signed)
[facility] Progress Note    Assessment/ Plan:   53y/o femalewith a complicated history with recent rectal/colonic  perforation with colostomy  resultant aspiration and ARDS, transferred here on 08/23/2018 for critical care management. Also bacteremia with fusobacterium and enterococcus. Has been tx with vanco, zosyn and meropenem for the peritonitis -> Linezolid + Meropenem.   1. Baseline creatinine 0.89 10/2015 2. AKI. Likely due to ATN in the setting of vanco/zosyn, shock, and MOSF. Non-oliguric with good urine output, and no evidence of obstruction from CT on 08/26/18. No need for HD. Would continue supportive care.Creatinine elevated and not improving.   - She is responding to IV Lasix; very brisk UOP. Holding Lasix at this time and will monitor response. May need to restart if renal function worsens over the next 24-48hrs + decreasing UOP.  - Renal function hopefully is beginning to stabilize.  - She's now extubated and noncommittal about dialysis despite multiple attempts to address. She says she will deal with the decision at that time if it becomes necessary. Fortunately at this time there is no absolute indication.    3. ARDS. Continue vent management. Oxygenation is stable. Continue negative fluid balance. 4. Acute Encephalopathy. Minimize sedation. 5. Peritonitis. Antibiotics per ID.  Subjective:   Denies f/c/n/v.  Also denies abd or CP; extubated now.   Objective:   BP (!) 154/90   Pulse (!) 119   Temp 98.8 F (37.1 C) (Oral)   Resp 20   Ht 5' 5"  (1.651 m)   Wt 77.2 kg   SpO2 95%   BMI 28.32 kg/m   Intake/Output Summary (Last 24 hours) at 09/04/2018 1139 Last data filed at 09/04/2018 1130 Gross per 24 hour  Intake 2059.11 ml  Output 2570 ml  Net -510.89 ml   Weight change: 0.3 kg  Physical Exam: General: NAD, intubated but alert  Neck: No JVD, no adenopathy CV: Heart RRR  Lungs: L/S coarse bilaterally. Abd: abd  mildly distended Extremities: (+)1 bilateral LE edema. Skin: No skin rash  Imaging: Ct Abdomen Pelvis Wo Contrast  Result Date: 09/02/2018 CLINICAL DATA:  Abdominal pain and fever.  Possible abscess. EXAM: CT ABDOMEN AND PELVIS WITHOUT CONTRAST TECHNIQUE: Multidetector CT imaging of the abdomen and pelvis was performed following the standard protocol without IV contrast. COMPARISON:  08/26/2018. FINDINGS: Lower chest: Patchy airspace consolidation and ground-glass in the right upper lobe, right middle lobe, lingula and both lower lobes, progressive from 08/26/2018. Small bilateral pleural effusions, similar. Heart is at the upper limits of normal in size to mildly enlarged. No pericardial effusion. Decreased attenuation of the intravascular compartment is indicative of anemia. Nasogastric tube is seen in the distal esophagus. Hepatobiliary: Liver appears heterogeneous. Low-attenuation lesion in the posterior right hepatic lobe measures 3.1 cm and may be new from 08/23/2018. Gallbladder is decompressed. No definite biliary ductal dilatation. Pancreas: Negative. Spleen: Negative. Adrenals/Urinary Tract: Adrenal glands and kidneys are unremarkable. Ureters are decompressed. Foley catheter seen in the bladder. Stomach/Bowel: Nasogastric tube terminates in the stomach. Mild nonspecific gaseous distension of small bowel in the left anatomic pelvis. Left lower quadrant colostomy. Colon is otherwise grossly unremarkable. Vascular/Lymphatic: Minimal atherosclerotic calcification of the distal aorta. No pathologically enlarged lymph nodes. Reproductive: Uterus appears to be surgically absent. No adnexal mass. Other: Moderate ascites, increased. Peritoneal dialysis catheter terminates in the right adnexa. Open ventral midline abdominal wound. Diffuse body wall edema. Musculoskeletal: No worrisome lytic or sclerotic lesions. IMPRESSION: 1. Worsening airspace consolidation and ground-glass in the lung bases, most  indicative of pneumonia. Associated small bilateral pleural effusions. 2. Hepatic steatosis. Possible area of new low-attenuation in the posterior right hepatic lobe. Assessment is difficult without IV contrast. Abscess is not excluded. Sonographic evaluation to assess for hyperemia may be useful, as clinically indicated. 3. Moderate ascites, increased from 08/26/2018, with peritoneal dialysis catheter in place. Electronically Signed   By: Lorin Picket M.D.   On: 09/02/2018 15:36   Dg Chest Port 1 View  Result Date: 09/03/2018 CLINICAL DATA:  Respiratory failure. Sepsis. History of colonic perforation. EXAM: PORTABLE CHEST 1 VIEW COMPARISON:  09/01/2018. FINDINGS: Endotracheal tube, NG tube, right PICC line stable position. Cardiomegaly with bilateral pulmonary infiltrates again noted. No significant change noted from prior exam. Mild bibasilar atelectasis. Small right pleural effusion can not be excluded. No pneumothorax. Prior cervical spine fusion. IMPRESSION: 1.  Lines and tubes in stable position. 2. Bilateral pulmonary infiltrates are again noted. Bibasilar atelectasis. Similar findings noted on prior exam. 3.  Stable cardiomegaly. Electronically Signed   By: Marcello Moores  Register   On: 09/03/2018 07:56   US Abdomen Limited Ruq  Result Date: 09/03/2018 CLINICAL DATA:  Abdominal pain. EXAM: ULTRASOUND ABDOMEN LIMITED RIGHT UPPER QUADRANT COMPARISON:  Body CT 09/02/2018 FINDINGS: Gallbladder: The gallbladder is decompressed. The gallbladder wall measures up to 5.5 mm in thickness, possibly exaggerated by collapsed state. No sonographic Murphy sign noted by sonographer. There is a small amount of pericholecystic fluid. Common bile duct: Diameter: 6.3 mm. Liver: Diffusely heterogeneous echogenicity of the liver. Nodular contour. There is a hypoechoic mass in the subcapsular portion of the inferior right lower lobe of the liver which measures 5.1 x 1.2 x 1.8 cm. In no increased vascularity to this mass is seen.  Portal vein is patent on color Doppler imaging with normal direction of blood flow towards the liver. Perihepatic ascites are seen. IMPRESSION: Decompressed gallbladder with probably artifactual thickening of the gallbladder wall measuring up to 5.5 mm. Pericholecystic fluid may be secondary to presence of ascites. Mild distension of the common bile duct measuring up to 6.3 mm. Proximal common bile duct obstruction can not be entirely excluded. Nodular liver contour with diffusely heterogeneous echogenicity, likely due to chronic liver disease. Large hypoechoic mass in the inferior right lobe of the liver. This may represent a liver abscess or subcapsular hematoma versus a true hepatic mass. Evaluation with MRI of the abdomen with contrast, when clinically feasible may be considered. Electronically Signed   By: Fidela Salisbury M.D.   On: 09/03/2018 12:12    Labs: BMET Recent Labs  Lab 08/29/18 0445 08/30/18 0425 08/31/18 0500 09/01/18 0405 09/02/18 0458 09/03/18 0500 09/04/18 0421  NA 141 142 140 144 143 143 145  K 3.2* 3.0* 3.4* 3.5 3.5 4.0 4.1  CL 106 111 112* 117* 116* 111 113*  CO2 19* 18* 16* 16* 14* 16* 15*  GLUCOSE 94 122* 108* 111* 108* 85 95  BUN 75* 75* 77* 88* 97* 100* 99*  CREATININE 5.86* 5.73* 5.74* 6.46* 6.32* 6.77* 6.72*  CALCIUM 7.5* 7.5* 7.0* 7.5* 7.4* 7.9* 7.9*  PHOS  --   --   --   --  7.5*  --   --    CBC Recent Labs  Lab 08/29/18 1101 08/30/18 0425 08/31/18 0500 09/01/18 0405 09/02/18 0458 09/03/18 0500 09/04/18 0421  WBC 11.1* 11.1* 13.9* 15.5* 14.3* 14.7* 16.2*  NEUTROABS 9.3* 9.0* 11.4*  --   --   --   --   HGB 7.2* 7.3* 6.8* 7.2* 7.0* 7.2*  7.2*  HCT 22.1* 22.8* 21.3* 23.7* 23.5* 23.3* 23.2*  MCV 94.8 95.0 100.5* 99.6 101.7* 101.3* 102.2*  PLT 383 438* 406* 434* 482* 631* 717*    Medications:    . chlorhexidine gluconate (MEDLINE KIT)  15 mL Mouth Rinse BID  . Chlorhexidine Gluconate Cloth  6 each Topical Daily  . fentaNYL  75 mcg Transdermal  Q72H  . guaiFENesin  30 mL Oral Q6H  . heparin  5,000 Units Subcutaneous Q8H  . mouth rinse  15 mL Mouth Rinse 10 times per day  . metoprolol tartrate  2.5 mg Intravenous Q6H  . morphine  15 mg Oral Q8H  . pantoprazole (PROTONIX) IV  40 mg Intravenous Q12H  . sodium chloride flush  10-40 mL Intracatheter Q12H  . venlafaxine  37.5 mg Per Tube BID WC      Otelia Santee, MD 09/04/2018, 11:39 AM

## 2018-09-04 NOTE — Progress Notes (Signed)
175mL wasted in sink - witnessed by [employer]

## 2018-09-04 NOTE — Progress Notes (Signed)
[facility] for Infectious Disease  Date of Admission:  08/23/2018     Total days of antibiotics 18         ASSESSMENT/PLAN  Julie Williams continues to receive treatment with broad spectrum linezolid and meropenem for peritonitis, Fusobacterium/Eneterococcus bacteremia and pneumonia following rectal perforation requiring partial colon resection and diverting colostomy. She remains afebrile and is now extubated and without complication. There is new concern for the possible hepatic abscess noted on ultrasound. IR has been consulted for aspiration and drain placement. This morning she has refused the procedure due to not wanting to be sedated again but now consenting. Discussed importance of aspiration in guiding our care. She has been well treated for pneumonia and can stop linezolid.  Meropenem would provide appropriate abdominal coverage once pneumonia has been treated.  1. Continue meropenem. Discontinue linezolid.  2. Wound care per general surgery. 3. Respiratory per CCM.    Principal Problem:   Fever Active Problems:   ARDS (adult respiratory distress syndrome) (HCC)   Acute respiratory failure with hypoxemia (HCC)   Stercoral ulcer of rectum   Rectal perforation (HCC)   Peritonitis (HCC)   Polymicrobial bacterial infection   Endotracheal tube present   History of ETT   Ileus (Julie Williams)   AKI (acute kidney injury) (Julie Williams)   . chlorhexidine gluconate (MEDLINE KIT)  15 mL Mouth Rinse BID  . Chlorhexidine Gluconate Cloth  6 each Topical Daily  . fentaNYL  75 mcg Transdermal Q72H  . guaiFENesin  30 mL Oral Q6H  . heparin  5,000 Units Subcutaneous Q8H  . mouth rinse  15 mL Mouth Rinse 10 times per day  . metoprolol tartrate  2.5 mg Intravenous Q6H  . morphine  15 mg Oral Q8H  . pantoprazole (PROTONIX) IV  40 mg Intravenous Q12H  . sodium chloride flush  10-40 mL Intracatheter Q12H  . venlafaxine  37.5 mg Per Tube BID WC    SUBJECTIVE:  Afebrile overnight with increased WBC  count from 14.7 to 16.2. Blood cultures from 1/6 without growth to date. Trickle tube feeds started yesterday and tolerating. US of the abdomen with large hypoechoic mass in the inferior right lobe of the liver possibly representing a liver abscess or subscapular hematoma with the recommendation for MRI of the abdomen with contrast. IR consulted for aspiration and possible drain placement which she is currently refusing. No acute events overnight.    Allergies  Allergen Reactions  . Bee Venom Swelling     Review of Systems: Review of Systems  Constitutional: Negative for chills, fever and malaise/fatigue.  Respiratory: Negative for cough, shortness of breath and wheezing.   Cardiovascular: Negative for chest pain and leg swelling.  Gastrointestinal: Positive for abdominal pain. Negative for constipation, diarrhea, nausea and vomiting.      OBJECTIVE: Vitals:   09/04/18 1000 09/04/18 1040 09/04/18 1100 09/04/18 1126  BP: (!) 154/90     Pulse: (!) 118  (!) 119   Resp: (!) 34  20   Temp:    98.8 F (37.1 C)  TempSrc:    Oral  SpO2: 96% 92% 95%   Weight:      Height:       Body mass index is 28.32 kg/m.  Physical Exam Constitutional:      General: She is not in acute distress.    Appearance: She is well-developed.     Comments: Lying in bed with head of bed elevated; anxious.   Cardiovascular:  Rate and Rhythm: Regular rhythm. Tachycardia present.     Heart sounds: Normal heart sounds.  Pulmonary:     Effort: Pulmonary effort is normal.     Breath sounds: Normal breath sounds. No wheezing, rhonchi or rales.  Abdominal:     General: Bowel sounds are normal. There is distension (mild).     Tenderness: There is no abdominal tenderness. There is no guarding or rebound.     Comments: JP drain with scant serosanguinous drainage. Abdominal dressing is clean and dry. Colostomy with liquid stool present.   Skin:    General: Skin is warm and dry.  Neurological:     Mental  Status: She is alert.     Lab Results Lab Results  Component Value Date   WBC 16.2 (H) 09/04/2018   HGB 7.2 (L) 09/04/2018   HCT 23.2 (L) 09/04/2018   MCV 102.2 (H) 09/04/2018   PLT 717 (H) 09/04/2018    Lab Results  Component Value Date   CREATININE 6.72 (H) 09/04/2018   BUN 99 (H) 09/04/2018   NA 145 09/04/2018   K 4.1 09/04/2018   CL 113 (H) 09/04/2018   CO2 15 (L) 09/04/2018    Lab Results  Component Value Date   ALT 13 08/31/2018   AST 21 08/31/2018   ALKPHOS 60 08/31/2018   BILITOT 0.7 08/31/2018     Microbiology: Recent Results (from the past 240 hour(s))  Culture, blood (Routine X 2) w Reflex to ID Panel     Status: None   Collection Time: 08/26/18  1:20 PM  Result Value Ref Range Status   Specimen Description BLOOD BLOOD RIGHT HAND  Final   Special Requests AEROBIC BOTTLE ONLY Blood Culture adequate volume  Final   Culture NO GROWTH 5 DAYS  Final   Report Status 08/31/2018 FINAL  Final  Culture, blood (Routine X 2) w Reflex to ID Panel     Status: None   Collection Time: 08/26/18  1:20 PM  Result Value Ref Range Status   Specimen Description BLOOD BLOOD LEFT HAND  Final   Special Requests AEROBIC BOTTLE ONLY Blood Culture adequate volume  Final   Culture NO GROWTH 5 DAYS  Final   Report Status 08/31/2018 FINAL  Final  Culture, respiratory (non-expectorated)     Status: None   Collection Time: 08/26/18  1:33 PM  Result Value Ref Range Status   Specimen Description TRACHEAL ASPIRATE  Final   Special Requests NONE  Final   Gram Stain   Final    RARE WBC PRESENT, PREDOMINANTLY PMN RARE YEAST Performed at [facility], [address], [place], [place] [address]    Culture FEW CANDIDA ALBICANS  Final   Report Status 08/29/2018 FINAL  Final  MRSA PCR Screening     Status: Abnormal   Collection Time: 08/28/18  8:21 AM  Result Value Ref Range Status   MRSA by PCR POSITIVE (A) NEGATIVE Final    Comment:        The GeneXpert MRSA Assay (FDA approved  for NASAL specimens only), is one component of a comprehensive MRSA colonization surveillance program. It is not intended to diagnose MRSA infection nor to guide or monitor treatment for MRSA infections. RESULT CALLED TO, READ BACK BY AND VERIFIED WITH: Annette Stable RN 10:50 08/28/17 ([email or web address]) Performed at [facility], [place] [address], [place], [place] [address]   Culture, respiratory (non-expectorated)     Status: None   Collection Time: 08/30/18  9:21  AM  Result Value Ref Range Status   Specimen Description TRACHEAL ASPIRATE  Final   Special Requests NONE  Final   Gram Stain   Final    RARE WBC PRESENT, PREDOMINANTLY PMN RARE GRAM POSITIVE COCCI Performed at [facility], [place] [address] [place], [place] [address]    Culture RARE CANDIDA ALBICANS  Final   Report Status 09/02/2018 FINAL  Final  Culture, blood (routine x 2)     Status: None (Preliminary result)   Collection Time: 08/31/18  4:48 PM  Result Value Ref Range Status   Specimen Description BLOOD LEFT ANTECUBITAL  Final   Special Requests   Final    BOTTLES DRAWN AEROBIC ONLY Blood Culture adequate volume   Culture   Final    NO GROWTH 4 DAYS Performed at [facility], [place] [address] [place], [place] [address]    Report Status PENDING  Incomplete  Culture, blood (routine x 2)     Status: None (Preliminary result)   Collection Time: 08/31/18  4:55 PM  Result Value Ref Range Status   Specimen Description BLOOD THUMB  Final   Special Requests   Final    BOTTLES DRAWN AEROBIC ONLY Blood Culture adequate volume   Culture   Final    NO GROWTH 4 DAYS Performed at [facility], [address], [place], [place] [address]    Report Status PENDING  Incomplete     Terri Piedra, NP [place] [facility] [phone] Pager  09/04/2018  11:50 AM
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
## 2018-09-04 NOTE — Procedures (Signed)
Extubation Procedure Note  Patient Details:   Name: Julie Williams DOB: 1965-10-22 MRN: [id number]   Airway Documentation:    Vent end date: 09/04/18 Vent end time: 1102   Evaluation  O2 sats: stable throughout Complications: No apparent complications Patient did tolerate procedure well. Bilateral Breath Sounds: Clear, Diminished   Yes: patient was able to breathe around the ETT prior to extubation and speak post extubation.   Acquanetta Belling 09/04/2018, 11:02 AM

## 2018-09-04 NOTE — Progress Notes (Addendum)
CSW received a call from pt's CN at [place] ( pt's RN is [phone]) who stated pt states she her pets have been at home for 3 weeks on "an automatic feeder".  Pt was intubated and was able to speak today and voiced this concern to staff.\  CSW spoke to [place] and asked if CSW could contact [facility]  Per pt's RN, pt's does not have her phone here and that [facility] staff has called [facility]/CN and security at [place]  And no phone/purse has been located.  Per the pt she has 23 chickens, a cat and a Probation officer" and that the dog and cat are on an automatic feeder, but that the chickens are not and that the dog is fenced in and cannot get out of the fence.  Pt asked CSW to call [facility] E[facility] at ph: [phone] to ask him/her to feed her animals. Pt refused to allow the CSW to call animal control when asked (twice).  CSW called and the number was in operation.  CSW spoke to non-emergency staff at [facility] at ph: Non-Emergency: [phone] who stated they would contact a supervisor and have them call the CSW back.  CSW will continue to follow for D/C needs.  Alphonse Guild. Shanae Luo, LCSW, LCAS, CSI Clinical Social Worker Ph: [phone]
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
## 2018-09-04 NOTE — Progress Notes (Signed)
53 year-old woman admitted to [facility] 12/24 with colonic perforation requiring colectomy, course complicated by aspiration pneumonia requiring mechanical ventilation 12/28 and bacteremia with fusobacterium and enterococcus, transferred to [facility] on 12/29 for management of ARDS. She has a history of chronic pain on opiates  1/7 switched to [facility], later to IV morphine 1/8 episode of vomiting feculent material, CT abdomen did not reveal any bowel pathology but showed hypodense lesion in the liver?  Abscess  Remains afebrile, awake and interactive and able to communicate by writing today, decreased breath sounds bilateral, soft mildly distended abdomen, nontender, JP drain empty, 1+ edema  Labs showed normal electrolytes, creatinine plateaued 6.7, mild leukocytosis and anemia.  Chest x-ray personally reviewed which shows bilateral patchy infiltrates, no effusions.  Impression/plan  Acute hypoxic respiratory failure-ARDS resolved, tolerated pressure support 5/5, proceed with trial of extubation  Sepsis/Fusobacterium and enterococcemia -continue meropenem and Zyvox per ID, consulted IR for aspiration of liver lesion, likely liver abscess, patient refusing currently, can see if this improves with antibiotics alone  Acute encephalopathy-much improved with IV morphine, continue fentanyl patch Other oral medications will be held  AKI-nonoliguric with good response to Lasix, she is deferring decision about dialysis and hopefully this will not be necessary  Protein calorie malnutrition, mild-swallow evaluation and hopefully can advance p.o. as she tolerates  Atrial fibrillation/RVR-related to above issue, discontinue amiodarone and continue metoprolol  The patient is critically ill with multiple organ systems failure and requires high complexity decision making for assessment and support, frequent evaluation and titration of therapies, application of advanced monitoring technologies and extensive  interpretation of multiple databases. Critical Care Time devoted to patient care services described in this note independent of APP/resident  time is 35 minutes.   Leanna Sato Elsworth Soho MD

## 2018-09-04 NOTE — Progress Notes (Signed)
CSW received a call from a deputy stating he talked to the pt's neighbor Rosario Adie and that the pt's neighbor had cheerfuly agreed to feed the pt's chickens, dog and cat until the pt returned home.  RN updated.  Please reconsult if future social work needs arise.  CSW signing off, as social work intervention is no longer needed.  Alphonse Guild. Kennadi Albany, LCSW, LCAS, CSI Clinical Social Worker Ph: [phone]

## 2018-09-04 NOTE — Care Management Note (Addendum)
Case Management Note  Patient Details  Name: AMONIE WISSER MRN: [id number] Date of Birth: 13-Aug-1966  Subjective/Objective:      Pt admitted with SBO with perf colon              Action/Plan:      Expected Discharge Date:                  Expected Discharge Plan:     In-House Referral:  Clinical Social Work  Discharge planning Services  CM Consult  Post Acute Care Choice:    Choice offered to:     DME Arranged:    DME Agency:     HH Arranged:    [place] Agency:     Status of Service:  In process, will continue to follow  If discussed at Long Length of Stay Meetings, dates discussed:    Additional Comments: 09/04/2018 Pt extubated late afternoon today.  Pt still requriing IV morphine and IV antibiotics, remains in AKI .  PT eval ordered.    Pt alert confirms she is from home alone.  CClaxton, Abelino Derrick, RN 09/04/2018, 4:12 PM

## 2018-09-04 NOTE — Progress Notes (Addendum)
CSW received a call from [facility] who stated the pt's phone/purse was not found in their lost and found.  Pt updated pt who refused to let CSW call animal control.  CSW asked if pt had access to any other numbers or contacts the CSW could call and pt stated no.  CSW asked again if CSW can call animal control and pt stated "No, please do, please don't".  CSW spoke to pt who stated the neighbor across the street from her home is Rosario Adie.  CSW looke up address online and pt verified that Mr. Saul Fordyce address is at   [address] [place], [place] [address]  CSW asked for and pt provided verbal permission to the  CSW to contact emergency dispatch in [facility] and request a welfare check be conducted at the pt's neighbor's home to update pt's neighbor that the pt is is in the hospital and to request that the neighbor please feed the pt's chickens, dog and cat.  Per the pt, the dogs food is in one trash can in the shed and the chicken's food is in the other trash can in the shed.  Dispatch is sending a Technical brewer to pt's neighbor's house now who will call the CSW back with the result.  CSW will continue to follow for D/C needs.  Julie Williams. Gorgeous [place], LCSW, LCAS, CSI Clinical Social Worker Ph: [phone]

## 2018-09-04 NOTE — Progress Notes (Signed)
[facility] Progress Note     Subjective: CC-  Patient awake and alert on vent. Nods yes when asked if she has abdominal pain. OG output decreasing, 350cc recorded for last 24 hours. Minimal output recorded from colostomy. Trickle tube feeds started yesterday and patient tolerated this well.  IR planning for image guided aspiration / drainage of perihepatic/subcapsular fluid.  Objective: Vital signs in last 24 hours: Temp:  [98.5 F (36.9 C)-99.3 F (37.4 C)] 98.7 F (37.1 C) (01/10 0734) Pulse Rate:  [80-124] 107 (01/10 0600) Resp:  [16-31] 19 (01/10 0600) BP: (91-149)/(64-100) 131/81 (01/10 0600) SpO2:  [87 %-100 %] 99 % (01/10 0733) FiO2 (%):  [30 %] 30 % (01/10 0733) Weight:  [77.2 kg] 77.2 kg (01/10 0447) Last BM Date: 09/03/18  Intake/Output from previous day: 01/09 0701 - 01/10 0700 In: 2360.8 [I.V.:1305.1; NG/GT:355.7; IV Piggyback:700] Out: 2220 [Urine:1850; Emesis/NG output:350; Stool:20] Intake/Output this shift: No intake/output data recorded.  PE: Gen: Alert, NAD, on vent HEENT: EOM's intact, pupils equal and round Abd: Soft,mild distension, mild diffuse tenderness, few BS heard,ostomy pink withimprovingarea of slough at 3 o'clock position and small amount liquid yellow/brown stool in ostomy pouch, open midline incision with slough/necrosis at base/few sutures visible, JP drain SS  Lab Results:  Recent Labs    09/03/18 0500 09/04/18 0421  WBC 14.7* 16.2*  HGB 7.2* 7.2*  HCT 23.3* 23.2*  PLT 631* 717*   BMET Recent Labs    09/03/18 0500 09/04/18 0421  NA 143 145  K 4.0 4.1  CL 111 113*  CO2 16* 15*  GLUCOSE 85 95  BUN 100* 99*  CREATININE 6.77* 6.72*  CALCIUM 7.9* 7.9*   PT/INR No results for input(s): LABPROT, INR in the last 72 hours. CMP     Component Value Date/Time   NA 145 09/04/2018 0421   K 4.1 09/04/2018 0421   CL 113 (H) 09/04/2018 0421   CO2 15 (L) 09/04/2018 0421   GLUCOSE 95 09/04/2018 0421   BUN 99 (H)  09/04/2018 0421   CREATININE 6.72 (H) 09/04/2018 0421   CREATININE 0.72 04/28/2014 1201   CALCIUM 7.9 (L) 09/04/2018 0421   PROT 5.9 (L) 08/31/2018 0500   ALBUMIN 1.1 (L) 08/31/2018 0500   AST 21 08/31/2018 0500   ALT 13 08/31/2018 0500   ALKPHOS 60 08/31/2018 0500   BILITOT 0.7 08/31/2018 0500   GFRNONAA 6 (L) 09/04/2018 0421   GFRNONAA >89 04/28/2014 1201   GFRAA 7 (L) 09/04/2018 0421   GFRAA >89 04/28/2014 1201   Lipase     Component Value Date/Time   LIPASE 87 (H) 08/23/2018 1834       Studies/Results: Ct Abdomen Pelvis Wo Contrast  Result Date: 09/02/2018 CLINICAL DATA:  Abdominal pain and fever.  Possible abscess. EXAM: CT ABDOMEN AND PELVIS WITHOUT CONTRAST TECHNIQUE: Multidetector CT imaging of the abdomen and pelvis was performed following the standard protocol without IV contrast. COMPARISON:  08/26/2018. FINDINGS: Lower chest: Patchy airspace consolidation and ground-glass in the right upper lobe, right middle lobe, lingula and both lower lobes, progressive from 08/26/2018. Small bilateral pleural effusions, similar. Heart is at the upper limits of normal in size to mildly enlarged. No pericardial effusion. Decreased attenuation of the intravascular compartment is indicative of anemia. Nasogastric tube is seen in the distal esophagus. Hepatobiliary: Liver appears heterogeneous. Low-attenuation lesion in the posterior right hepatic lobe measures 3.1 cm and may be new from 08/23/2018. Gallbladder is decompressed. No definite biliary ductal dilatation. Pancreas: Negative. Spleen:  Negative. Adrenals/Urinary Tract: Adrenal glands and kidneys are unremarkable. Ureters are decompressed. Foley catheter seen in the bladder. Stomach/Bowel: Nasogastric tube terminates in the stomach. Mild nonspecific gaseous distension of small bowel in the left anatomic pelvis. Left lower quadrant colostomy. Colon is otherwise grossly unremarkable. Vascular/Lymphatic: Minimal atherosclerotic calcification  of the distal aorta. No pathologically enlarged lymph nodes. Reproductive: Uterus appears to be surgically absent. No adnexal mass. Other: Moderate ascites, increased. Peritoneal dialysis catheter terminates in the right adnexa. Open ventral midline abdominal wound. Diffuse body wall edema. Musculoskeletal: No worrisome lytic or sclerotic lesions. IMPRESSION: 1. Worsening airspace consolidation and ground-glass in the lung bases, most indicative of pneumonia. Associated small bilateral pleural effusions. 2. Hepatic steatosis. Possible area of new low-attenuation in the posterior right hepatic lobe. Assessment is difficult without IV contrast. Abscess is not excluded. Sonographic evaluation to assess for hyperemia may be useful, as clinically indicated. 3. Moderate ascites, increased from 08/26/2018, with peritoneal dialysis catheter in place. Electronically Signed   By: Lorin Picket M.D.   On: 09/02/2018 15:36   Dg Chest Port 1 View  Result Date: 09/03/2018 CLINICAL DATA:  Respiratory failure. Sepsis. History of colonic perforation. EXAM: PORTABLE CHEST 1 VIEW COMPARISON:  09/01/2018. FINDINGS: Endotracheal tube, NG tube, right PICC line stable position. Cardiomegaly with bilateral pulmonary infiltrates again noted. No significant change noted from prior exam. Mild bibasilar atelectasis. Small right pleural effusion can not be excluded. No pneumothorax. Prior cervical spine fusion. IMPRESSION: 1.  Lines and tubes in stable position. 2. Bilateral pulmonary infiltrates are again noted. Bibasilar atelectasis. Similar findings noted on prior exam. 3.  Stable cardiomegaly. Electronically Signed   By: Marcello Moores  Register   On: 09/03/2018 07:56   US Abdomen Limited Ruq  Result Date: 09/03/2018 CLINICAL DATA:  Abdominal pain. EXAM: ULTRASOUND ABDOMEN LIMITED RIGHT UPPER QUADRANT COMPARISON:  Body CT 09/02/2018 FINDINGS: Gallbladder: The gallbladder is decompressed. The gallbladder wall measures up to 5.5 mm in  thickness, possibly exaggerated by collapsed state. No sonographic Murphy sign noted by sonographer. There is a small amount of pericholecystic fluid. Common bile duct: Diameter: 6.3 mm. Liver: Diffusely heterogeneous echogenicity of the liver. Nodular contour. There is a hypoechoic mass in the subcapsular portion of the inferior right lower lobe of the liver which measures 5.1 x 1.2 x 1.8 cm. In no increased vascularity to this mass is seen. Portal vein is patent on color Doppler imaging with normal direction of blood flow towards the liver. Perihepatic ascites are seen. IMPRESSION: Decompressed gallbladder with probably artifactual thickening of the gallbladder wall measuring up to 5.5 mm. Pericholecystic fluid may be secondary to presence of ascites. Mild distension of the common bile duct measuring up to 6.3 mm. Proximal common bile duct obstruction can not be entirely excluded. Nodular liver contour with diffusely heterogeneous echogenicity, likely due to chronic liver disease. Large hypoechoic mass in the inferior right lobe of the liver. This may represent a liver abscess or subcapsular hematoma versus a true hepatic mass. Evaluation with MRI of the abdomen with contrast, when clinically feasible may be considered. Electronically Signed   By: Fidela Salisbury M.D.   On: 09/03/2018 12:12    Anti-infectives: Anti-infectives (From admission, onward)   Start     Dose/Rate Route Frequency Ordered Stop   09/04/18 1121  meropenem (MERREM) 1 g in sodium chloride 0.9 % 100 mL IVPB     1 g 200 mL/hr over 30 Minutes Intravenous Every 24 hours 09/03/18 1709     08/30/18 1030  linezolid (ZYVOX) IVPB 600 mg     600 mg 300 mL/hr over 60 Minutes Intravenous Every 12 hours 08/30/18 0941     08/28/18 2000  DAPTOmycin (CUBICIN) 640 mg in sodium chloride 0.9 % IVPB  Status:  Discontinued     640 mg 225.6 mL/hr over 30 Minutes Intravenous Every 48 hours 08/28/18 1330 08/30/18 0941   08/28/18 1400  meropenem  (MERREM) 1 g in sodium chloride 0.9 % 100 mL IVPB  Status:  Discontinued     1 g 200 mL/hr over 30 Minutes Intravenous Every 12 hours 08/28/18 1330 09/03/18 1709   08/26/18 1030  DAPTOmycin (CUBICIN) 618.5 mg in sodium chloride 0.9 % IVPB  Status:  Discontinued     8 mg/kg  77.3 kg 224.7 mL/hr over 30 Minutes Intravenous Every 48 hours 08/26/18 0934 08/26/18 0935   08/26/18 1030  DAPTOmycin (CUBICIN) 620 mg in sodium chloride 0.9 % IVPB  Status:  Discontinued     620 mg 224.8 mL/hr over 30 Minutes Intravenous Every 48 hours 08/26/18 0935 08/27/18 1038   08/25/18 0300  vancomycin (VANCOCIN) IVPB 1000 mg/200 mL premix  Status:  Discontinued     1,000 mg 200 mL/hr over 60 Minutes Intravenous  Once 08/24/18 0122 08/24/18 1049   08/24/18 0100  vancomycin (VANCOCIN) IVPB 1000 mg/200 mL premix  Status:  Discontinued     1,000 mg 200 mL/hr over 60 Minutes Intravenous Every 12 hours 08/24/18 0045 08/24/18 0122   08/24/18 0100  piperacillin-tazobactam (ZOSYN) IVPB 2.25 g  Status:  Discontinued     2.25 g 100 mL/hr over 30 Minutes Intravenous Every 6 hours 08/24/18 0045 08/28/18 1304       Assessment/Plan Enterococcal bacteremia,fusobacterium and enterococcus/aspiration pneumonia-ID following HTN GERD Depression Alcohol abuse Chronic pain/narcotic dependence -On Morphine at home Former smoker VDRF/ARDS- aspiration pneumonia -CCM Acute renal failure-creatinine >5.73>>5.74>>6.46>>6.77>>6.72, nephrology following Anemia-H/H:7.2/23.2today and s/p1 unit PRBCs1/6 Hypokalemia -K4.1  Perforated colon/rectum S/p exploratory laparotomy, resection distal sigmoid colon and proximal rectum, diverting colostomy, wound vac placement 12/25 Dr. Noberto Retort - POD16 -surgical path?  -wound vac changed to wet to dry TID 08/28/18 - continue JP drain and monitor output - CT scan 1/8 worsening pneumonia, no definite intraabdominal abscess or SBO; cannot rule out liver abscess - ileus seems to be  resolving  ID -vancomycin 12/30>>12/30;zosyn 12/30>>08/28/18,daptomycin 1/1>>1/5, Meropenem 1/3>> day#8, linezolid 1/5>>day#6 VTE -SCDs, sq heparin FEN -IVF, NPO/OGT,hold TF for procedure Foley -in place  Plan: IR for image guided aspiration / drainage of perihepatic/subcapsular fluid. Ok to restart trickle tube feedings after IR procedure. ContinueTIDwet to dry dressingchanges.   LOS: 12 days    Wellington Hampshire , [facility] 09/04/2018, 7:51 AM Pager: [phone] Mon 7:00 am -11:30 AM Tues-Fri 7:00 am-4:30 pm Sat-Sun 7:00 am-11:30 am

## 2018-09-04 NOTE — Progress Notes (Signed)
CSW confirmed with [place] at ph: [phone] that the welfare check was completed by a deputy to the pt's neighbor's home in order to let the pt's neighbor Rosario Adie know that the pt's pets (chickens, a dog and a cat were not fed and needed to please be fed by the neighbor until the neighbor D/C's from the hospital, but that there was no indication of the results and that the dispatcher would have a deputy call the CSW back with an update if any were available.  CSW will continue to follow for D/C needs.  Alphonse Guild. Mylan Lengyel, LCSW, LCAS, CSI Clinical Social Worker Ph: [phone]

## 2018-09-04 NOTE — Consult Note (Signed)
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 throughout the left lung and basilar right lung have not significantly changed. There is a small right pleural effusion. No pneumothorax is identified. IMPRESSION: Unchanged left greater than right lung airspace disease. Small right pleural effusion. Electronically Signed   By: Logan Bores M.D.   On: 08/26/2018 08:00   Dg Chest Port 1 View  Result Date: 08/25/2018 CLINICAL DATA:  ARDS. EXAM: PORTABLE CHEST 1 VIEW COMPARISON:  08/24/2018. FINDINGS: Endotracheal tube, left IJ line, NG tube in stable position. Heart size stable. Bilateral atelectasis/infiltrates, left side greater than right again noted without interim change. No pleural effusion or pneumothorax. Heart size stable. Prior cervical spine fusion. IMPRESSION: 1.  Lines and tubes in stable position. 2. Bilateral atelectasis/infiltrates, left side greater than right again noted. No interim change. Electronically Signed   By: Marcello Moores  Register   On: 08/25/2018 05:21   Dg Chest Port 1 View  Result Date: 08/24/2018 CLINICAL DATA:  Verify ETT/central line placement. EXAM:  PORTABLE CHEST 1 VIEW COMPARISON:  08/23/2018. FINDINGS: Endotracheal tube, left IJ line, NG tube in stable position. Heart size normal. Diffuse left lung and right base pulmonary infiltrates are again noted. Similar findings noted on prior exam. Bibasilar atelectasis. No prominent pleural effusions noted. No pneumothorax. Prior cervical spine fusion. IMPRESSION: 1.  Lines and tubes in stable position. 2. Bilateral pulmonary infiltrates and bibasilar atelectasis again noted. Similar findings noted on prior exam. Electronically Signed   By: Marcello Moores  Register   On: 08/24/2018 05:57   [place] Ekg Site Rite  Result Date: 08/27/2018 If Site Rite image not attached, placement could not be confirmed due to current cardiac rhythm.  US Abdomen Limited Ruq  Result Date: 09/03/2018 CLINICAL DATA:  Abdominal pain. EXAM: ULTRASOUND ABDOMEN LIMITED RIGHT UPPER QUADRANT COMPARISON:  Body CT 09/02/2018 FINDINGS: Gallbladder: The gallbladder is decompressed. The gallbladder wall measures up  to 5.5 mm in thickness, possibly exaggerated by collapsed state. No sonographic Murphy sign noted by sonographer. There is a small amount of pericholecystic fluid. Common bile duct: Diameter: 6.3 mm. Liver: Diffusely heterogeneous echogenicity of the liver. Nodular contour. There is a hypoechoic mass in the subcapsular portion of the inferior right lower lobe of the liver which measures 5.1 x 1.2 x 1.8 cm. In no increased vascularity to this mass is seen. Portal vein is patent on color Doppler imaging with normal direction of blood flow towards the liver. Perihepatic ascites are seen. IMPRESSION: Decompressed gallbladder with probably artifactual thickening of the gallbladder wall measuring up to 5.5 mm. Pericholecystic fluid may be secondary to presence of ascites. Mild distension of the common bile duct measuring up to 6.3 mm. Proximal common bile duct obstruction can not be entirely excluded. Nodular liver contour with diffusely heterogeneous  echogenicity, likely due to chronic liver disease. Large hypoechoic mass in the inferior right lobe of the liver. This may represent a liver abscess or subcapsular hematoma versus a true hepatic mass. Evaluation with MRI of the abdomen with contrast, when clinically feasible may be considered. Electronically Signed   By: Fidela Salisbury M.D.   On: 09/03/2018 12:12    Labs:  CBC: Recent Labs    09/01/18 0405 09/02/18 0458 09/03/18 0500 09/04/18 0421  WBC 15.5* 14.3* 14.7* 16.2*  HGB 7.2* 7.0* 7.2* 7.2*  HCT 23.7* 23.5* 23.3* 23.2*  PLT 434* 482* 631* 717*    COAGS: Recent Labs    08/23/18 1834  INR 1.15  APTT 31    BMP: Recent Labs    09/01/18 0405 09/02/18 0458 09/03/18 0500 09/04/18 0421  NA 144 143 143 145  K 3.5 3.5 4.0 4.1  CL 117* 116* 111 113*  CO2 16* 14* 16* 15*  GLUCOSE 111* 108* 85 95  BUN 88* 97* 100* 99*  CALCIUM 7.5* 7.4* 7.9* 7.9*  CREATININE 6.46* 6.32* 6.77* 6.72*  GFRNONAA 7* 7* 6* 6*  GFRAA 8* 8* 7* 7*    LIVER FUNCTION TESTS: Recent Labs    08/23/18 1834 08/24/18 0240 08/27/18 0415 08/31/18 0500  BILITOT 1.0 1.2 0.6 0.7  AST 135* 123* 38 21  ALT 89* 81* 31 13  ALKPHOS 43 48 54 60  PROT 5.3* 5.3* 5.7* 5.9*  ALBUMIN 1.6* 1.7* 1.3* 1.1*    TUMOR MARKERS: No results for input(s): AFPTM, CEA, CA199, CHROMGRNA in the last 8760 hours.  Assessment and Plan:  Perihepatic fluid collection Leukocytosis Discussed with pt the procedure of aspiration/drain placement of collection. She is refusing procedure at this time [facility] talk with MD" RN can call me back if changes mind and pt wants to move ahead  Thank you for this interesting consult.  I greatly enjoyed meeting Alethia L XXXJones and look forward to participating in their care.  A copy of this report was sent to the requesting provider on this date.  Electronically Signed: Lavonia Drafts, PA-C 09/04/2018, 8:50 AM   I spent a total of 40 Minutes    in face to face in  clinical consultation, greater than 50% of which was counseling/coordinating care for perihepatic fluid collection aspiration/drain

## 2018-09-04 NOTE — Progress Notes (Signed)
2mg  morphine injection wasted in sink. Witnessed by [employer]. Patient is refusing scheduled  Morphine and only wants ice packs on abdomen.
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
## 2018-09-04 NOTE — Progress Notes (Signed)
Patient is looking for her cell phone. Unable to locate phone in patient's room, and when number dialed it states "Your call cannot be completed at this time."  Will attempt to contact [facility] and see if patient left phone at that facility.

## 2018-09-05 ENCOUNTER — Inpatient Hospital Stay (HOSPITAL_COMMUNITY): Payer: PPO

## 2018-09-05 LAB — GLUCOSE, CAPILLARY
Glucose-Capillary: 74 mg/dL (ref 70–99)
Glucose-Capillary: 78 mg/dL (ref 70–99)
Glucose-Capillary: 85 mg/dL (ref 70–99)
Glucose-Capillary: 97 mg/dL (ref 70–99)

## 2018-09-05 LAB — CULTURE, BLOOD (ROUTINE X 2)
CULTURE: NO GROWTH
Culture: NO GROWTH
SPECIAL REQUESTS: ADEQUATE
Special Requests: ADEQUATE

## 2018-09-05 LAB — BASIC METABOLIC PANEL
ANION GAP: 15 (ref 5–15)
BUN: 94 mg/dL — ABNORMAL HIGH (ref 6–20)
CO2: 15 mmol/L — ABNORMAL LOW (ref 22–32)
Calcium: 8.3 mg/dL — ABNORMAL LOW (ref 8.9–10.3)
Chloride: 117 mmol/L — ABNORMAL HIGH (ref 98–111)
Creatinine, Ser: 6.53 mg/dL — ABNORMAL HIGH (ref 0.44–1.00)
GFR calc Af Amer: 8 mL/min — ABNORMAL LOW (ref >60)
GFR calc non Af Amer: 7 mL/min — ABNORMAL LOW (ref >60)
GLUCOSE: 90 mg/dL (ref 70–99)
Potassium: 4.4 mmol/L (ref 3.5–5.1)
Sodium: 147 mmol/L — ABNORMAL HIGH (ref 135–145)

## 2018-09-05 LAB — CBC
HCT: 23.9 % — ABNORMAL LOW (ref 36.0–46.0)
Hemoglobin: 7.3 g/dL — ABNORMAL LOW (ref 12.0–15.0)
MCH: 31.2 pg (ref 26.0–34.0)
MCHC: 30.5 g/dL (ref 30.0–36.0)
MCV: 102.1 fL — ABNORMAL HIGH (ref 80.0–100.0)
Platelets: 801 10*3/uL — ABNORMAL HIGH (ref 150–400)
RBC: 2.34 MIL/uL — ABNORMAL LOW (ref 3.87–5.11)
RDW: 16.5 % — ABNORMAL HIGH (ref 11.5–15.5)
WBC: 14.5 10*3/uL — ABNORMAL HIGH (ref 4.0–10.5)
nRBC: 0.1 % (ref 0.0–0.2)

## 2018-09-05 MED ORDER — ORAL CARE MOUTH RINSE
15.0000 mL | Freq: Three times a day (TID) | OROMUCOSAL | Status: DC
  Administered 2018-09-05 – 2018-09-13 (×17): 15 mL via OROMUCOSAL

## 2018-09-05 MED ORDER — RESOURCE THICKENUP CLEAR PO POWD
ORAL | Status: DC | PRN
  Filled 2018-09-05 (×2): qty 125

## 2018-09-05 MED ORDER — MORPHINE SULFATE 15 MG PO TABS
15.0000 mg | ORAL_TABLET | Freq: Three times a day (TID) | ORAL | Status: DC
  Administered 2018-09-05 – 2018-09-13 (×23): 15 mg via ORAL
  Filled 2018-09-05 (×24): qty 1

## 2018-09-05 NOTE — Progress Notes (Addendum)
[facility] Progress Note    Assessment/ Plan:   [id number] a complicated history with recent rectal/colonicperforation with colostomyresultant aspiration and ARDS, transferred here on 08/23/2018 for critical care management. Also bacteremia with fusobacterium and enterococcus. Has been tx with vanco, zosyn and meropenem for the peritonitis -> Linezolid + Meropenem.  1. Baseline creatinine 0.89 10/2015 2. AKI. Likely due to ATN in the setting of vanco/zosyn, shock, and MOSF. Non-oliguric with good urine output, and no evidence of obstruction from CT on 08/26/18. No need for HD. Would continue supportive care.Creatinineelevated and may be platueing.   - her last  IV Lasix dose was on 1/8; still with good UOP and now plateau of Cr. Continue holding Lasix and letting her equilibrate. May need to restart if renal function worsens over the next 24-48hrs + decreasing UOP.   - She's now extubated and noncommittal about dialysis despite multiple attempts to address. She says she will deal with the decision at that time if it becomes necessary. Fortunately at this time there is no absolute indication.    3. ARDS. Now extubated. Oxygenation is stable. Continue negative fluid balance. 4. Acute Encephalopathy. Minimize sedation. 5. Peritonitis. Antibiotics per ID.  Subjective:   C/o abd pain and nausea. Denies f/c/CP/dyspnea   Objective:   BP (!) 165/95   Pulse 95   Temp 98.3 F (36.8 C) (Oral)   Resp 20   Ht 5\' 5"  (1.651 m)   Wt 76.3 kg   SpO2 98%   BMI 27.99 kg/m   Intake/Output Summary (Last 24 hours) at 09/05/2018 1339 Last data filed at 09/05/2018 1200 Gross per 24 hour  Intake 365.44 ml  Output 1285 ml  Net -919.56 ml   Weight change: -0.9 kg  Physical Exam: General: NAD, intubated but alert Neck: No JVD, no adenopathy CV: Heart RRR  Lungs: L/S coarse bilaterally. Abd: abd mildly distended Extremities: (+)1  bilateral LE edema. Skin: No skin rash  Imaging: No results found.  Labs: BMET Recent Labs  Lab 08/30/18 0425 08/31/18 0500 09/01/18 0405 09/02/18 0458 09/03/18 0500 09/04/18 0421 09/05/18 0458  NA 142 140 144 143 143 145 147*  K 3.0* 3.4* 3.5 3.5 4.0 4.1 4.4  CL 111 112* 117* 116* 111 113* 117*  CO2 18* 16* 16* 14* 16* 15* 15*  GLUCOSE 122* 108* 111* 108* 85 95 90  BUN 75* 77* 88* 97* 100* 99* 94*  CREATININE 5.73* 5.74* 6.46* 6.32* 6.77* 6.72* 6.53*  CALCIUM 7.5* 7.0* 7.5* 7.4* 7.9* 7.9* 8.3*  PHOS  --   --   --  7.5*  --   --   --    CBC Recent Labs  Lab 08/30/18 0425 08/31/18 0500  09/02/18 0458 09/03/18 0500 09/04/18 0421 09/05/18 0458  WBC 11.1* 13.9*   < > 14.3* 14.7* 16.2* 14.5*  NEUTROABS 9.0* 11.4*  --   --   --   --   --   HGB 7.3* 6.8*   < > 7.0* 7.2* 7.2* 7.3*  HCT 22.8* 21.3*   < > 23.5* 23.3* 23.2* 23.9*  MCV 95.0 100.5*   < > 101.7* 101.3* 102.2* 102.1*  PLT 438* 406*   < > 482* 631* 717* 801*   < > = values in this interval not displayed.    Medications:    . Chlorhexidine Gluconate Cloth  6 each Topical Daily  . fentaNYL  75 mcg Transdermal Q72H  . guaiFENesin  30 mL Oral Q6H  . heparin  5,000  Units Subcutaneous Q8H  . metoprolol tartrate  2.5 mg Intravenous Q6H  .  morphine injection  2 mg Intravenous Q8H  . pantoprazole (PROTONIX) IV  40 mg Intravenous Q12H  . sodium chloride flush  10-40 mL Intracatheter Q12H  . venlafaxine  37.5 mg Per Tube BID WC      Otelia Santee, MD 09/05/2018, 1:39 PM

## 2018-09-05 NOTE — Evaluation (Signed)
Physical Therapy Evaluation Patient Details Name: Julie Williams MRN: [id number] DOB: July 22, 1966 Today's Date: 09/05/2018   History of Present Illness  53 year-old woman admitted to [facility] 12/24 with colonic perforation requiring colectomy, course complicated by aspiration pneumonia requiring mechanical ventilation 12/28 and bacteremia with fusobacterium and enterococcus, transferred to [facility] on 12/29 for management of ARDS. Extubated 1/10.     Clinical Impression  Pt admitted with above diagnosis. Pt currently with functional limitations due to the deficits listed below (see PT Problem List). PTA pt independent lives alone. Today patient max A of 2 people,  limited by pain and  unable to tolerate bed mobility despite coordination with RN on oral pain medication an hour prior. Instructed patient on bed level therex, delirium precautions, and brought patient into chair mode which she is tolerating at end of visit. Will need further reinforcement.  RN present and aware.   Pt will benefit from skilled PT to increase their independence and safety with mobility to allow discharge to the venue listed below.       Follow Up Recommendations CIR    Equipment Recommendations  (TBD)    Recommendations for Other Services Rehab consult;OT consult     Precautions / Restrictions Precautions Precautions: Fall Precaution Comments: abdominal wound, colostomy, watch BP Restrictions Weight Bearing Restrictions: No      Mobility  Bed Mobility Overal bed mobility: Needs Assistance Bed Mobility: Supine to Sit     Supine to sit: Total assist(due to pain )     General bed mobility comments: unable to tolerate rolling/supine to sit. Brought patient into chair mode tolerated end of session   Transfers                 General transfer comment: deferred  Ambulation/Gait                Stairs            Wheelchair Mobility    Modified Rankin (Stroke Patients Only)        Balance                                             Pertinent Vitals/Pain Pain Assessment: Faces Faces Pain Scale: Hurts whole lot Pain Location: stomach Pain Descriptors / Indicators: Discomfort;Moaning Pain Intervention(s): Limited activity within patient's tolerance;Monitored during session;Premedicated before session    Home Living Family/patient expects to be discharged to:: Inpatient rehab Living Arrangements: Alone                    Prior Function Level of Independence: Independent         Comments: pt reports independence at baseline.      Hand Dominance        Extremity/Trunk Assessment   Upper Extremity Assessment Upper Extremity Assessment: Overall WFL for tasks assessed    Lower Extremity Assessment Lower Extremity Assessment: Generalized weakness       Communication   Communication: No difficulties  Cognition Arousal/Alertness: Awake/alert Behavior During Therapy: WFL for tasks assessed/performed Overall Cognitive Status: Within Functional Limits for tasks assessed                                        General Comments General comments (skin integrity, edema, etc.): Reinforced concepts  of delerium precautions.     Exercises Low Level/ICU Exercises Ankle Circles/Pumps: 20 reps Quad Sets: 10 reps Hip ABduction/ADduction: 10 reps Heel Slides: 10 reps Shoulder Flexion: 10 reps Elbow Flexion: 10 reps Shoulder Press: 20 reps   Assessment/Plan    PT Assessment Patient needs continued PT services  PT Problem List Pain;Decreased strength;Decreased range of motion       PT Treatment Interventions DME instruction;Gait training;Stair training;Functional mobility training;Therapeutic activities;Balance training;Therapeutic exercise    PT Goals (Current goals can be found in the Care Plan section)  Acute Rehab PT Goals Patient Stated Goal: less pain PT Goal Formulation: With patient Time For Goal  Achievement: 09/19/18 Potential to Achieve Goals: Fair    Frequency Min 3X/week   Barriers to discharge        Co-evaluation               AM-PAC PT "6 Clicks" Mobility  Outcome Measure Help needed turning from your back to your side while in a flat bed without using bedrails?: Total Help needed moving from lying on your back to sitting on the side of a flat bed without using bedrails?: Total Help needed moving to and from a bed to a chair (including a wheelchair)?: Total Help needed standing up from a chair using your arms (e.g., wheelchair or bedside chair)?: Total Help needed to walk in hospital room?: Total Help needed climbing 3-5 steps with a railing? : Total 6 Click Score: 6    End of Session   Activity Tolerance: Patient limited by pain Patient left: in bed;with call bell/phone within reach(in chair mode. RN aware) Nurse Communication: Mobility status PT Visit Diagnosis: Muscle weakness (generalized) (M62.81);Other abnormalities of gait and mobility (R26.89)    Time: 5697-9480 PT Time Calculation (min) (ACUTE ONLY): 23 min   Charges:   PT Evaluation $PT Eval High Complexity: 1 High PT Treatments $Therapeutic Exercise: 8-22 mins      Reinaldo Berber, PT, DPT Acute Rehabilitation Services Pager: [phone] Office: [phone]    Reinaldo Berber 09/05/2018, 7:19 PM

## 2018-09-05 NOTE — Progress Notes (Signed)
Patient ID: Julie Williams, female   DOB: 1965/10/21, 53 y.o.   MRN: [id number]          [facility] for Infectious Disease    Date of Admission:  08/23/2018   Total days of antibiotics 19         She is currently off the floor for a swallowing evaluation.  She is afebrile and improving other than the recent development of what appears to be a small liver abscess.  She changed her mind again yesterday afternoon and is now refusing aspiration/drainage of the abscess.  I will continue meropenem for now.         Michel Bickers, MD [facility] for Infectious [place] [facility] [phone] pager   [phone] cell 09/05/2018, 1:30 PM

## 2018-09-05 NOTE — Progress Notes (Signed)
53year-old woman admitted to [facility] 12/24 with colonic perforation requiring colectomy, course complicated by aspiration pneumonia requiring mechanical ventilation 12/28 and bacteremia with fusobacterium and enterococcus, transferred to [facility] on 12/29 for management of ARDS. She has a history of chronic pain on opiates  1/7 switched to MSIR,later to IV morphine 1/8 episode of vomiting feculent material,CT abdomen did not reveal any bowel pathology but showed hypodense lesion in the liver?  Abscess  1/10 extubated  Afebrile last 24 hours, mild confused, very thirsty did okay with ice chips and applesauce, abdomen feels bloated, distended abdomen, tender left, no guarding, decreased breath sounds bilateral, S1-S2 tacky.  Labs show mild hyponatremia, decreasing creatinine from 6.7-6.5, normal electrolytes, decreasing leukocytosis, stable anemia.  Chest x-ray from 1/10 continues to show bilateral patchy infiltrates.  Impression/plan  Acute hypoxic respiratory failure/ARDS-resolved, extubated continues to require oxygen.  Acute encephalopathy/chronic opiate use-much improved with IV morphine, continue fentanyl patch, can change to MSIR when she is able to swallow better  Sepsis/Fusobacterium meningococcemia-on meropenem and Zyvox per ID, liver lesion is likely an abscess, she refused aspiration initially, will repeat ultrasound early next week to see if this improves with antibiotics alone since she is clinically improving.  AKI-nonoliguric and diuresing spontaneously, hopefully will not require dialysis.  Protein calorie malnutrition/dysphagia-swallow evaluation today and hopefully can advance  Atrial fibrillation/RVR-related to illness, on metoprolol only at this time.  Needs aggressive PT Other social issues -appreciate social work input My critical care time x 32 minutes  Kara Mead MD. FCCP. [facility] Pager [phone] If no response call [phone]    09/05/2018

## 2018-09-05 NOTE — Progress Notes (Signed)
  Modified Barium Swallow Progress Note  Patient Details  Name: Julie Williams MRN: [id number] Date of Birth: Aug 31, 1965  Today's Date: 09/05/2018  Modified Barium Swallow completed.  Full report located under Chart Review in the Imaging Section.  Brief recommendations include the following:  Clinical Impression  MBS was completed using thin liquids via spoon, cup and straw, nectar thick liquids via spoon and cup, pureed material and soft solids.  The patient presented with a moderate oropharyngeal dysphagia.  The oral phase was marked by decreased lingual control and movement which led to delayed a/p transport of the bolus and premature loss of the bolus across textures.  She was noted to have non functional tongue pumping with material rocking back and forth in her oral cavity prior to pushing material back toward pharynx.  She was generally able to recollect all this material and clear it from the oral cavity with only trace residuals seen.  The pharyngeal phase was marked by intermittently decreased epiglottic movement, decreased laryngeal elevation, decreased laryngeal vestibule closure, decreased base of tongue retraction and decreased cricopharyngeal opening.  These deficits led to mild-moderate vallecular residue across textures (ie the thicker the material the worse the residue) and mild pyriform sinus residue given thin liquids.  Penetration into the laryngeal vestibule was seen given spoon and self fed cup sips of thin liquids.  SILENT aspiration was seen after the swallowing following cup sips of thin liquids.  This appeared to come from the mild residue in the pyriform sinuses.  Use of a chin tuck using a straw was attempted and unsuccessful to completely prevent penetration.  Transient penetration x1 bolus of spoon sip of nectar thick liquids was seen as well.  No other penetration or aspiration was seen.  Esophageal sweep did not reveal overt issues.  Recommend that the patient begin a  dysphagia 1 diet with nectar thick liquids.  She will require FULL supervision to control rate of intake and bolus size.  ST will follow during acute stay and the patient will most likely benefit from ST follow up at the next level of care.     Swallow Evaluation Recommendations       SLP Diet Recommendations: Nectar thick liquid;Dysphagia 1 (Puree) solids   Liquid Administration via: Cup;No straw   Medication Administration: Crushed with puree   Supervision: Staff to assist with self feeding   Compensations: Slow rate;Small sips/bites;Minimize environmental distractions   Postural Changes: Seated upright at 90 degrees   Oral Care Recommendations: Oral care QID   Other Recommendations: Order thickener from pharmacy;Prohibited food (jello, ice cream, thin soups);Have oral suction available   Shelly Flatten, [place], [place] Acute Rehab SLP [phone]  Lamar Sprinkles 09/05/2018,1:34 PM
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
## 2018-09-05 NOTE — Progress Notes (Signed)
[facility] Progress Note     Subjective: CC-  Patient extubated yesterday. Worked with SLP this AM, planning to obtain MBS. Currently recommending NPO except ice chips with assistance. Complains of persistent abdominal pain. States that the incision site is painful. Denies any n/v. Flatus and loose stool in ostomy pouch. Patient refused image guided aspiration / drainage of perihepatic/subcapsular fluid.  Objective: Vital signs in last 24 hours: Temp:  [97.4 F (36.3 C)-98.8 F (37.1 C)] 98.1 F (36.7 C) (01/11 0429) Pulse Rate:  [89-119] 95 (01/11 0800) Resp:  [18-34] 27 (01/11 0800) BP: (126-169)/(87-131) 169/97 (01/11 0800) SpO2:  [92 %-100 %] 97 % (01/11 0800) FiO2 (%):  [30 %] 30 % (01/10 1040) Weight:  [76.3 kg] 76.3 kg (01/11 0500) Last BM Date: 09/03/18  Intake/Output from previous day: 01/10 0701 - 01/11 0700 In: 675.8 [I.V.:276; IV Piggyback:399.9] Out: 1985 [ZOXWR:6045; Drains:10] Intake/Output this shift: Total I/O In: 10 [I.V.:10] Out: -   PE: Gen: Alert, NAD HEENT: EOM's intact, pupils equal and round Abd: Soft,mild distension, mild diffuse tenderness, ostomy pink withimprovingarea of slough at 3 o'clock positionand small amount air and liquidyellow/brown stool in ostomy pouch, open midline incision with slough/necrosis at base/few sutures visible, JP drain SS   Lab Results:  Recent Labs    09/04/18 0421 09/05/18 0458  WBC 16.2* 14.5*  HGB 7.2* 7.3*  HCT 23.2* 23.9*  PLT 717* 801*   BMET Recent Labs    09/04/18 0421 09/05/18 0458  NA 145 147*  K 4.1 4.4  CL 113* 117*  CO2 15* 15*  GLUCOSE 95 90  BUN 99* 94*  CREATININE 6.72* 6.53*  CALCIUM 7.9* 8.3*   PT/INR No results for input(s): LABPROT, INR in the last 72 hours. CMP     Component Value Date/Time   NA 147 (H) 09/05/2018 0458   K 4.4 09/05/2018 0458   CL 117 (H) 09/05/2018 0458   CO2 15 (L) 09/05/2018 0458   GLUCOSE 90 09/05/2018 0458   BUN 94 (H)  09/05/2018 0458   CREATININE 6.53 (H) 09/05/2018 0458   CREATININE 0.72 04/28/2014 1201   CALCIUM 8.3 (L) 09/05/2018 0458   PROT 5.9 (L) 08/31/2018 0500   ALBUMIN 1.1 (L) 08/31/2018 0500   AST 21 08/31/2018 0500   ALT 13 08/31/2018 0500   ALKPHOS 60 08/31/2018 0500   BILITOT 0.7 08/31/2018 0500   GFRNONAA 7 (L) 09/05/2018 0458   GFRNONAA >89 04/28/2014 1201   GFRAA 8 (L) 09/05/2018 0458   GFRAA >89 04/28/2014 1201   Lipase     Component Value Date/Time   LIPASE 87 (H) 08/23/2018 1834       Studies/Results: US Abdomen Limited Ruq  Result Date: 09/03/2018 CLINICAL DATA:  Abdominal pain. EXAM: ULTRASOUND ABDOMEN LIMITED RIGHT UPPER QUADRANT COMPARISON:  Body CT 09/02/2018 FINDINGS: Gallbladder: The gallbladder is decompressed. The gallbladder wall measures up to 5.5 mm in thickness, possibly exaggerated by collapsed state. No sonographic Murphy sign noted by sonographer. There is a small amount of pericholecystic fluid. Common bile duct: Diameter: 6.3 mm. Liver: Diffusely heterogeneous echogenicity of the liver. Nodular contour. There is a hypoechoic mass in the subcapsular portion of the inferior right lower lobe of the liver which measures 5.1 x 1.2 x 1.8 cm. In no increased vascularity to this mass is seen. Portal vein is patent on color Doppler imaging with normal direction of blood flow towards the liver. Perihepatic ascites are seen. IMPRESSION: Decompressed gallbladder with probably artifactual thickening of the gallbladder  wall measuring up to 5.5 mm. Pericholecystic fluid may be secondary to presence of ascites. Mild distension of the common bile duct measuring up to 6.3 mm. Proximal common bile duct obstruction can not be entirely excluded. Nodular liver contour with diffusely heterogeneous echogenicity, likely due to chronic liver disease. Large hypoechoic mass in the inferior right lobe of the liver. This may represent a liver abscess or subcapsular hematoma versus a true hepatic  mass. Evaluation with MRI of the abdomen with contrast, when clinically feasible may be considered. Electronically Signed   By: Fidela Salisbury M.D.   On: 09/03/2018 12:12    Anti-infectives: Anti-infectives (From admission, onward)   Start     Dose/Rate Route Frequency Ordered Stop   09/04/18 1121  meropenem (MERREM) 1 g in sodium chloride 0.9 % 100 mL IVPB     1 g 200 mL/hr over 30 Minutes Intravenous Every 24 hours 09/03/18 1709     08/30/18 1030  linezolid (ZYVOX) IVPB 600 mg  Status:  Discontinued     600 mg 300 mL/hr over 60 Minutes Intravenous Every 12 hours 08/30/18 0941 09/04/18 1437   08/28/18 2000  DAPTOmycin (CUBICIN) 640 mg in sodium chloride 0.9 % IVPB  Status:  Discontinued     640 mg 225.6 mL/hr over 30 Minutes Intravenous Every 48 hours 08/28/18 1330 08/30/18 0941   08/28/18 1400  meropenem (MERREM) 1 g in sodium chloride 0.9 % 100 mL IVPB  Status:  Discontinued     1 g 200 mL/hr over 30 Minutes Intravenous Every 12 hours 08/28/18 1330 09/03/18 1709   08/26/18 1030  DAPTOmycin (CUBICIN) 618.5 mg in sodium chloride 0.9 % IVPB  Status:  Discontinued     8 mg/kg  77.3 kg 224.7 mL/hr over 30 Minutes Intravenous Every 48 hours 08/26/18 0934 08/26/18 0935   08/26/18 1030  DAPTOmycin (CUBICIN) 620 mg in sodium chloride 0.9 % IVPB  Status:  Discontinued     620 mg 224.8 mL/hr over 30 Minutes Intravenous Every 48 hours 08/26/18 0935 08/27/18 1038   08/25/18 0300  vancomycin (VANCOCIN) IVPB 1000 mg/200 mL premix  Status:  Discontinued     1,000 mg 200 mL/hr over 60 Minutes Intravenous  Once 08/24/18 0122 08/24/18 1049   08/24/18 0100  vancomycin (VANCOCIN) IVPB 1000 mg/200 mL premix  Status:  Discontinued     1,000 mg 200 mL/hr over 60 Minutes Intravenous Every 12 hours 08/24/18 0045 08/24/18 0122   08/24/18 0100  piperacillin-tazobactam (ZOSYN) IVPB 2.25 g  Status:  Discontinued     2.25 g 100 mL/hr over 30 Minutes Intravenous Every 6 hours 08/24/18 0045 08/28/18 1304        Assessment/Plan Enterococcal bacteremia,fusobacterium and enterococcus/aspiration pneumonia-ID following HTN GERD Depression Alcohol abuse Chronic pain/narcotic dependence -On Morphine at home Former smoker VDRF/ARDS- aspiration pneumonia -CCM Acute renal failure-creatinine 5.74>>6.46>>6.77>>6.72>>6.53, nephrology following Anemia-H/H:7.3/23.9today and s/p1 unit PRBCs1/6 Hypokalemia -K4.4  Perforated colon/rectum S/p exploratory laparotomy, resection distal sigmoid colon and proximal rectum, diverting colostomy, wound vac placement 12/25 Dr. Noberto Retort - POD17 -surgical path?  -wound vac changed to wet to dryTID1/3/20 - continue JP drain and monitor output -CT scan 1/8 worsening pneumonia, no definite intraabdominal abscess or SBO; cannot rule out liver abscess >> patient refused  IR for image guided aspiration / drainage of perihepatic/ subcapsular fluid.   ID -vancomycin 12/30>>12/30;zosyn 12/30>>08/28/18,daptomycin 1/1>>1/5, Meropenem 1/3>> day#9, linezolid 1/5>>1/10 VTE -SCDs, sq heparin FEN -IVF, NPO except ice chips, MBS pending Foley -in place  Plan: MBS pending. Patient  with little ostomy output therefore would be cautious with diet advancement. Please document strict I&Os. ContinueTIDwet to dry dressingchanges.   LOS: 13 days    Julie Williams , [facility] 09/05/2018, 8:22 AM Pager: [phone] Mon 7:00 am -11:30 AM Tues-Fri 7:00 am-4:30 pm Sat-Sun 7:00 am-11:30 am

## 2018-09-06 ENCOUNTER — Inpatient Hospital Stay (HOSPITAL_COMMUNITY): Payer: PPO

## 2018-09-06 LAB — CBC
HCT: 23.1 % — ABNORMAL LOW (ref 36.0–46.0)
Hemoglobin: 7 g/dL — ABNORMAL LOW (ref 12.0–15.0)
MCH: 30.2 pg (ref 26.0–34.0)
MCHC: 30.3 g/dL (ref 30.0–36.0)
MCV: 99.6 fL (ref 80.0–100.0)
Platelets: 774 10*3/uL — ABNORMAL HIGH (ref 150–400)
RBC: 2.32 MIL/uL — ABNORMAL LOW (ref 3.87–5.11)
RDW: 15.9 % — ABNORMAL HIGH (ref 11.5–15.5)
WBC: 13.1 10*3/uL — ABNORMAL HIGH (ref 4.0–10.5)
nRBC: 0.2 % (ref 0.0–0.2)

## 2018-09-06 LAB — NA AND K (SODIUM & POTASSIUM), RAND UR
POTASSIUM UR: 12 mmol/L
Sodium, Ur: 93 mmol/L

## 2018-09-06 LAB — GLUCOSE, CAPILLARY
Glucose-Capillary: 92 mg/dL (ref 70–99)
Glucose-Capillary: 96 mg/dL (ref 70–99)
Glucose-Capillary: 106 mg/dL — ABNORMAL HIGH (ref 70–99)

## 2018-09-06 LAB — BASIC METABOLIC PANEL
Anion gap: 11 (ref 5–15)
BUN: 93 mg/dL — ABNORMAL HIGH (ref 6–20)
CALCIUM: 8.4 mg/dL — AB (ref 8.9–10.3)
CO2: 18 mmol/L — AB (ref 22–32)
Chloride: 120 mmol/L — ABNORMAL HIGH (ref 98–111)
Creatinine, Ser: 5.73 mg/dL — ABNORMAL HIGH (ref 0.44–1.00)
GFR calc Af Amer: 9 mL/min — ABNORMAL LOW (ref >60)
GFR calc non Af Amer: 8 mL/min — ABNORMAL LOW (ref >60)
GLUCOSE: 116 mg/dL — AB (ref 70–99)
Potassium: 4.3 mmol/L (ref 3.5–5.1)
Sodium: 149 mmol/L — ABNORMAL HIGH (ref 135–145)

## 2018-09-06 MED ORDER — VENLAFAXINE HCL ER 37.5 MG PO CP24
37.5000 mg | ORAL_CAPSULE | Freq: Every day | ORAL | Status: DC
  Administered 2018-09-06 – 2018-09-13 (×8): 37.5 mg via ORAL
  Filled 2018-09-06 (×8): qty 1

## 2018-09-06 MED ORDER — DEXTROSE 5 % IV SOLN
INTRAVENOUS | Status: DC
  Administered 2018-09-06 – 2018-09-08 (×4): via INTRAVENOUS

## 2018-09-06 MED ORDER — HALOPERIDOL LACTATE 5 MG/ML IJ SOLN: 2.0000 mg | Freq: Four times a day (QID) | INTRAMUSCULAR | Status: DC | PRN

## 2018-09-06 MED ORDER — QUETIAPINE FUMARATE 25 MG PO TABS
25.0000 mg | ORAL_TABLET | Freq: Two times a day (BID) | ORAL | Status: DC
  Administered 2018-09-06 – 2018-09-07 (×3): 25 mg via ORAL
  Filled 2018-09-06 (×4): qty 1

## 2018-09-06 NOTE — Progress Notes (Signed)
Pharmacy Antibiotic Note  Julie Williams is a 53 y.o. female admitted on 08/23/2018 with peritonitis, uremia, enterococcus and fusobacterium.  Pharmacy has been consulted for meropenem dosing. Pt is afebrile and WBC is trending down. Scr remains well above her baseline but is also trending down.   Plan: Continue meropenem 1gm IV Q24H - borderline dose, may be able to increase soon since Scr is improving F/u renal fxn, C&S, clinical status and LOT  Height: 5\' 5"  (165.1 cm) Weight: 168 lb 3.4 oz (76.3 kg) IBW/kg (Calculated) : 57  Temp (24hrs), Avg:98.1 F (36.7 C), Min:97.6 F (36.4 C), Max:98.3 F (36.8 C)  Recent Labs  Lab 09/02/18 0458 09/03/18 0500 09/04/18 0421 09/05/18 0458 09/06/18 0824  WBC 14.3* 14.7* 16.2* 14.5* 13.1*  CREATININE 6.32* 6.77* 6.72* 6.53* 5.73*    Estimated Creatinine Clearance: 11.5 mL/min (A) (by C-G formula based on SCr of 5.73 mg/dL (H)).    Allergies  Allergen Reactions  . Bee Venom Swelling    Antimicrobials this admission:   Zosyn12/30 >> 1/3   Daptomycin1/1 x1,1/3 >>1/5   Vancomycin 12/30 >> 12/30   Meropenem 1/3 >>   Zyvox 1/5>> 1/10  Microbiology results: Outside Hospital ([facility]cultures:   12/25 blood cx 4/4: enterococcus faecalis/fusobacterium nucleatum [facility]:   12/29 blood cx>> NGTD   12/29 urine cx>> negative   12/29 resp cx>>yeast   1/3 MRSA PCR >> positive   1/1 TA >> few candida albicans    1/1 BCx >> negative   1/6 rBCx >> NEG   Thank you for allowing pharmacy to be a part of this patient's care.  Salome Arnt, PharmD, BCPS Please see AMION for all pharmacy numbers 09/06/2018 10:01 AM

## 2018-09-06 NOTE — Progress Notes (Signed)
[place] Progress Note Patient Name: Julie Williams DOB: 09/24/1965 MRN: [id number]   Date of Service  09/06/2018  HPI/Events of Note  Delirium - Request for [facility] and sedation.   eICU Interventions  Will order: 1. [place]. 2. Seroquel 25 mg PO now and BID. 3. Monitor QTc interval Q 6 hours. Notify MD if QTc interval > 500 milliseconds.      Intervention Category Major Interventions: Delirium, psychosis, severe agitation - evaluation and management  Sommer,Steven Eugene 09/06/2018, 2:02 AM
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
## 2018-09-06 NOTE — Progress Notes (Signed)
Rehab Admissions Coordinator Note:  Patient was screened by Cleatrice Burke for appropriateness for an Inpatient Acute Rehab Consult per PT recs.   At this time, we are recommending Inpatient Rehab consult. Please place order for consult.  Cleatrice Burke RN MSN 09/06/2018, 11:24 AM  I can be reached at [phone].
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
## 2018-09-06 NOTE — Progress Notes (Signed)
Occupational Therapy Progress Note  Pt seen for second session to assist her back to bed and to [facility] as nsg having a difficult time getting her up from recliner.  Pt was able to stand from recliner with mod A +2 using stedy, transferred to [facility] with mod A +2, and back to bed.  Pt fatigued but able to assist throughout entire session.  She actually moved better this session than she did earlier, but continues with impaired cognition.  Anticipate she will make steady progress.    Recommend CIR.    09/06/18 1643  OT Visit Information  Last OT Received On 09/06/18  Assistance Needed +2  History of Present Illness 53 year-old woman admitted to [facility] 12/24 with colonic perforation requiring colectomy, course complicated by aspiration pneumonia requiring mechanical ventilation 12/28 and bacteremia with fusobacterium and enterococcus, transferred to [facility] on 12/29 for management of ARDS.  She was extubated 09/04/17.  PMH includes: Rotator cuff syndrome Rt shoulder; cervical spine degeneration, essential HTN, depression, chronic pain with chronic opiate use   Precautions  Precautions Fall  Precaution Comments abdominal wound, JP drain,  colostomy, watch BP  Pain Assessment  Pain Assessment Faces  Faces Pain Scale 6  Pain Location stomach abdomen, Rt shoulder when touched   Pain Descriptors / Indicators Grimacing;Guarding;Moaning  Pain Intervention(s) Monitored during session;Limited activity within patient's tolerance;Repositioned  Cognition  Arousal/Alertness Awake/alert  Behavior During Therapy WFL for tasks assessed/performed  Overall Cognitive Status Impaired/Different from baseline  Area of Impairment Attention;Memory;Following commands;Safety/judgement;Awareness;Problem solving  Current Attention Level Sustained;Focused  Following Commands Follows one step commands inconsistently;Follows one step commands with increased time  Safety/Judgement Decreased awareness of safety;Decreased awareness  of deficits  Awareness Intellectual  Problem Solving Slow processing;Decreased initiation;Difficulty sequencing;Requires verbal cues;Requires tactile cues  General Comments Pt reports seeing water dripping from walls - ? delerium   Upper Extremity Assessment  Upper Extremity Assessment Generalized weakness  RUE Deficits / Details Pt reports h/o Rt rotator cuff repair in 8/19 and reports pain Rt shoulder when touched   Lower Extremity Assessment  Lower Extremity Assessment Generalized weakness  ADL  Overall ADL's  Needs assistance/impaired  Toilet Transfer Moderate assistance;+2 for physical assistance;Stand-pivot;BSC (stedy )  Toileting- Clothing Manipulation and Hygiene Total assistance;Sit to/from stand  Functional mobility during ADLs +2 for physical assistance;+2 for safety/equipment;Moderate assistance (using stedy )  Bed Mobility  Overal bed mobility Needs Assistance  Bed Mobility Supine to Sit;Sit to Supine  Sit to supine Mod assist  General bed mobility comments cueing for seqencing and problem solving.  Assist to lift LEs onto bed   Balance  Overall balance assessment Needs assistance  Sitting-balance support Feet supported  Sitting balance-Leahy Scale Poor  Sitting balance - Comments requires min A to maintain EOB sitting   Standing balance support Bilateral upper extremity supported  Standing balance-Leahy Scale Poor  Standing balance comment Pt requires bil. UEs and min A to maintain static standing using stedy   Transfers  Overall transfer level Needs assistance  Equipment used Ambulation equipment used  Transfer via [place] to/from [facility]  Sit to Stand Mod assist;+2 physical assistance;+2 safety/equipment  Stand pivot transfers +2 physical assistance;+2 safety/equipment;Min assist  General transfer comment Pt requesting use BSC.  Using stedy pt stood from recliner with mod A +2 to power up into standing and cues to  extend hips.  She stood from [facility] with mod A+2, and stood from stedy with min A   General Comments  General comments (skin integrity, edema, etc.) VSS with pt on 2L supplemtal 02  OT - End of Session  Equipment Utilized During Treatment Oxygen  Activity Tolerance Patient tolerated treatment well  Patient left in bed;with call bell/phone within reach;with nursing/sitter in room  Nurse Communication Mobility status;Need for lift equipment  OT Assessment/Plan  OT Plan Discharge plan remains appropriate  OT Visit Diagnosis Unsteadiness on feet (R26.81);Muscle weakness (generalized) (M62.81)  OT Frequency (ACUTE ONLY) Min 2X/week  Recommendations for Other Services Rehab consult  Follow Up Recommendations CIR;Supervision/Assistance - 24 hour  OT Equipment None recommended by OT  AM-PAC OT "6 Clicks" Daily Activity Outcome Measure (Version 2)  Help from another person eating meals? 2  Help from another person taking care of personal grooming? 2  Help from another person toileting, which includes using toliet, bedpan, or urinal? 2  Help from another person bathing (including washing, rinsing, drying)? 2  Help from another person to put on and taking off regular upper body clothing? 2  Help from another person to put on and taking off regular lower body clothing? 1  6 Click Score 11  OT Goal Progression  Progress towards OT goals Progressing toward goals  ADL Goals  Pt Will Perform Toileting - Clothing Manipulation and hygiene with min assist;sit to/from stand  OT Time Calculation  OT Start Time (ACUTE ONLY) 1559  OT Stop Time (ACUTE ONLY) 1625  OT Time Calculation (min) 26 min  OT General Charges  $OT Visit 1 Visit  OT Treatments  $Self Care/Home Management  23-37 mins  Lucille Passy, OTR/L Acute Rehabilitation Services Pager [phone] Office [phone]

## 2018-09-06 NOTE — Evaluation (Signed)
Occupational Therapy Evaluation Patient Details Name: Julie Williams MRN: [id number] DOB: 1966-03-20 Today's Date: 09/06/2018    History of Present Illness 53 year-old woman admitted to [facility] 12/24 with colonic perforation requiring colectomy, course complicated by aspiration pneumonia requiring mechanical ventilation 12/28 and bacteremia with fusobacterium and enterococcus, transferred to [facility] on 12/29 for management of ARDS.  She was extubated 09/04/17.  PMH includes: Rotator cuff syndrome Rt shoulder; cervical spine degeneration, essential HTN, depression, chronic pain with chronic opiate use    Clinical Impression   Pt admitted with above. She demonstrates the below listed deficits and will benefit from continued OT to maximize safety and independence with BADLs.  Pt presents to OT with generalized weakness, decreased activity tolerance, impaired balance, pain, and impaired cognition.  She currently requires mod A for simple grooming tasks, and max - total A for remainder of ADLs.  She was able to move to EOB with max A and transferred to recliner with max A +2.  VSS remained stable throughout.  Pt reports she lives alone with her dog, cat, and 26 chickens, and was fully independent PTA.  Anticipate she will make good progress, and feel she would benefit from CIR level rehab to allow her to discharge home with intermittent assistance.   Will follow acutely.       Follow Up Recommendations  CIR;Supervision/Assistance - 24 hour    Equipment Recommendations  None recommended by OT    Recommendations for Other Services Rehab consult     Precautions / Restrictions Precautions Precautions: Fall Precaution Comments: abdominal wound, JP drain,  colostomy, watch BP Restrictions Weight Bearing Restrictions: No      Mobility Bed Mobility Overal bed mobility: Needs Assistance Bed Mobility: Supine to Sit     Supine to sit: Max assist     General bed mobility comments: cues and  assist to initiate movement.  Assist to move LEs off the bed and assist to lift trunk from bed   Transfers Overall transfer level: Needs assistance Equipment used: 2 person hand held assist Transfers: Sit to/from [employer] Transfers Sit to Stand: Mod assist;+2 physical assistance;+2 safety/equipment Stand pivot transfers: Max assist;+2 physical assistance;+2 safety/equipment       General transfer comment: Pt initially stating she can't stand and needs to lie back in bed.  Once she was moved to EOB sitting and prompted to stand, she was able to extend hips and kness with max A.  She shuffled feet to pivot to chair requiring max A +2 to maintain balance     Balance Overall balance assessment: Needs assistance Sitting-balance support: Feet supported Sitting balance-Leahy Scale: Poor Sitting balance - Comments: requires min to moderate assist to maintain EOB sitting.  Pt leans to Rt and with frequent complaint of fatigue and desire to lie down    Standing balance support: Bilateral upper extremity supported Standing balance-Leahy Scale: Poor Standing balance comment: Pt requires max A +2 to maintain full standing for brief periods                            ADL either performed or assessed with clinical judgement   ADL Overall ADL's : Needs assistance/impaired Eating/Feeding: Moderate assistance;Bed level;Sitting   Grooming: Brushing hair;Wash/dry hands;Wash/dry face;Moderate assistance;Sitting   Upper Body Bathing: Maximal assistance;Sitting   Lower Body Bathing: Maximal assistance;Sit to/from stand;+2 for physical assistance;+2 for safety/equipment   Upper Body Dressing : Maximal assistance;Sitting   Lower Body Dressing: Total  assistance;Sit to/from stand   Toilet Transfer: +2 for physical assistance;+2 for safety/equipment;Stand-pivot;Maximal assistance;BSC   Toileting- Clothing Manipulation and Hygiene: Total assistance;Sit to/from stand        Functional mobility during ADLs: Maximal assistance;+2 for physical assistance;+2 for safety/equipment       Vision         Perception     Praxis      Pertinent Vitals/Pain Pain Assessment: Faces Faces Pain Scale: Hurts whole lot Pain Location: stomach abdomen  Pain Descriptors / Indicators: Grimacing;Guarding;Moaning     Hand Dominance     Extremity/Trunk Assessment Upper Extremity Assessment Upper Extremity Assessment: Generalized weakness;RUE deficits/detail RUE Deficits / Details: Pt reports h/o Rt rotator cuff repair in 8/19 and reports pain Rt shoulder when touched    Lower Extremity Assessment Lower Extremity Assessment: Generalized weakness       Communication Communication Communication: No difficulties   Cognition Arousal/Alertness: Awake/alert Behavior During Therapy: WFL for tasks assessed/performed Overall Cognitive Status: Impaired/Different from baseline Area of Impairment: Attention;Memory;Following commands;Safety/judgement;Awareness;Problem solving                   Current Attention Level: Sustained;Focused   Following Commands: Follows one step commands inconsistently;Follows one step commands with increased time Safety/Judgement: Decreased awareness of safety;Decreased awareness of deficits Awareness: Intellectual Problem Solving: Slow processing;Decreased initiation;Difficulty sequencing;Requires verbal cues;Requires tactile cues General Comments: PT very slow to follow commands initially.  She will answer questions well, then starts talking in the third person.  She demonstrates periods of sustained attention    General Comments  VSS throughout session     Exercises     Shoulder Instructions      Home Living Family/patient expects to be discharged to:: Unsure Living Arrangements: Alone                               Additional Comments: Pt reports she lives alone with her 26 chickens, dog and cat.  She reports she  has a son in [place], who has not visited       Prior Functioning/Environment Level of Independence: Independent        Comments: Pt reoprts she was fully independent         OT Problem List: Decreased strength;Decreased range of motion;Decreased activity tolerance;Impaired balance (sitting and/or standing);Decreased cognition;Decreased coordination;Decreased safety awareness;Decreased knowledge of use of DME or AE;Cardiopulmonary status limiting activity;Pain;Impaired UE functional use      OT Treatment/Interventions: Self-care/ADL training;Therapeutic exercise;Neuromuscular education;Energy conservation;DME and/or AE instruction;Therapeutic activities;Cognitive remediation/compensation;Patient/family education;Balance training    OT Goals(Current goals can be found in the care plan section) Acute Rehab OT Goals Patient Stated Goal: to go home and take care of chickens, dog and cat  OT Goal Formulation: With patient Time For Goal Achievement: 09/20/18 Potential to Achieve Goals: Good ADL Goals Pt Will Perform Eating: with modified independence;sitting Pt Will Perform Grooming: with min assist;standing Pt Will Perform Upper Body Bathing: with set-up;with supervision;sitting Pt Will Perform Lower Body Bathing: with min assist;sit to/from stand Pt Will Perform Upper Body Dressing: with set-up;with supervision;sitting Pt Will Perform Lower Body Dressing: with min assist;sit to/from stand Pt Will Transfer to Toilet: with min assist;ambulating;regular height toilet;bedside commode;grab bars Pt Will Perform Toileting - Clothing Manipulation and hygiene: with min assist;sit to/from stand Pt/caregiver will Perform Home Exercise Program: Increased strength;Right Upper extremity;Left upper extremity;With Supervision;With written HEP provided Additional ADL Goal #1: Pt will demonstrate ability to selectively attend to  ADL activity x 5 mins with min cues  OT Frequency: Min 2X/week   Barriers  to D/C: Decreased caregiver support          Co-evaluation              AM-PAC OT "6 Clicks" Daily Activity     Outcome Measure Help from another person eating meals?: A Lot Help from another person taking care of personal grooming?: A Lot Help from another person toileting, which includes using toliet, bedpan, or urinal?: A Lot Help from another person bathing (including washing, rinsing, drying)?: A Lot Help from another person to put on and taking off regular upper body clothing?: A Lot Help from another person to put on and taking off regular lower body clothing?: Total 6 Click Score: 11   End of Session Equipment Utilized During Treatment: Oxygen Nurse Communication: Mobility status;Need for lift equipment  Activity Tolerance: Patient tolerated treatment well Patient left: in chair;with call bell/phone within reach;with chair alarm set  OT Visit Diagnosis: Unsteadiness on feet (R26.81);Muscle weakness (generalized) (M62.81)                Time: 1884-1660 OT Time Calculation (min): 24 min Charges:  OT General Charges $OT Visit: 1 Visit OT Evaluation $OT Eval Moderate Complexity: 1 Mod OT Treatments $Self Care/Home Management : 8-22 mins  Lucille Passy, OTR/L Acute Rehabilitation Services Pager [phone] Office [phone]   Lucille Passy M 09/06/2018, 4:45 PM

## 2018-09-06 NOTE — Progress Notes (Signed)
53year-old woman admitted to [facility] 12/24 with colonic perforation requiring colectomy, course complicated by aspiration pneumonia requiring mechanical ventilation 12/28 and bacteremia with fusobacterium and enterococcus, transferred to [facility] on 12/29 for management of ARDS. She has chronic pain on opiates  1/7 switched to MSIR,later to IV morphine 1/8 episode of vomiting feculent material,CT abdomen did not reveal any bowel pathology but showed hypodense lesion in the liver? Abscess  1/10 extubated  Was more confused overnight, afebrile, urine output questionable, pulled Purewick out multiple times On exam this morning-sedate, interactive, calm, nonfocal, soft abdomen mildly distended tender near colostomy, stool in bag, S1-S2 tacky, decreased breath sounds bilateral.  Labs show decreasing creatinine to 5.7, mild increase in sodium to 149, decreased leukocytosis, anemia and hemoglobin 7.0.  Chest x-ray personally reviewed which shows bilateral patchy infiltrates and right lower lobe consolidation, (poor inspiration]  Impression/plan Acute hypoxic respiratory failure/ARDS/aspiration pneumonia-requiring less oxygen although chest x-ray looks slight worse, appearance could be related to poor inspiration.  Acute encephalopathy/chronic opiate use-we will try to use increased doses of MSIR, discontinue fentanyl patch and use low-dose Seroquel and Haldol as needed for delirium  Sepsis/Fusobacterium/enterococcus bacteremia -ertapenem per ID, has liver lesion which is likely an abscess, she initially refused drainage, will reimage with right upper quadrant ultrasound next week since she is clinically improving  AKI nonoliguric-spontaneously diuresing and showing some signs of renal recovery, hopefully has dodged dialysis  Protein calorie malnutrition, moderate/dysphagia-advance as she continues to improve. Needs aggressive PT and mobilization.  My critical care time x 75m  Kara Mead MD.  FCCP.  Pulmonary & Critical care Pager [phone] If no response call [phone]   09/06/2018
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
## 2018-09-07 ENCOUNTER — Inpatient Hospital Stay (HOSPITAL_COMMUNITY): Payer: PPO

## 2018-09-07 DIAGNOSIS — K750 Abscess of liver: Secondary | ICD-10-CM

## 2018-09-07 DIAGNOSIS — R000 Tachycardia, unspecified: Secondary | ICD-10-CM

## 2018-09-07 DIAGNOSIS — R1310 Dysphagia, unspecified: Secondary | ICD-10-CM

## 2018-09-07 DIAGNOSIS — D62 Acute posthemorrhagic anemia: Secondary | ICD-10-CM

## 2018-09-07 DIAGNOSIS — F1010 Alcohol abuse, uncomplicated: Secondary | ICD-10-CM

## 2018-09-07 DIAGNOSIS — D72829 Elevated white blood cell count, unspecified: Secondary | ICD-10-CM | POA: Insufficient documentation

## 2018-09-07 DIAGNOSIS — I10 Essential (primary) hypertension: Secondary | ICD-10-CM

## 2018-09-07 DIAGNOSIS — E870 Hyperosmolality and hypernatremia: Secondary | ICD-10-CM

## 2018-09-07 DIAGNOSIS — Z5329 Procedure and treatment not carried out because of patient's decision for other reasons: Secondary | ICD-10-CM

## 2018-09-07 HISTORY — DX: Abscess of liver: K75.0

## 2018-09-07 HISTORY — DX: Acute posthemorrhagic anemia: D62

## 2018-09-07 LAB — CBC
HCT: 23.4 % — ABNORMAL LOW (ref 36.0–46.0)
Hemoglobin: 7.1 g/dL — ABNORMAL LOW (ref 12.0–15.0)
MCH: 31 pg (ref 26.0–34.0)
MCHC: 30.3 g/dL (ref 30.0–36.0)
MCV: 102.2 fL — ABNORMAL HIGH (ref 80.0–100.0)
Platelets: 847 10*3/uL — ABNORMAL HIGH (ref 150–400)
RBC: 2.29 MIL/uL — ABNORMAL LOW (ref 3.87–5.11)
RDW: 16.3 % — ABNORMAL HIGH (ref 11.5–15.5)
WBC: 14.5 10*3/uL — ABNORMAL HIGH (ref 4.0–10.5)
nRBC: 0 % (ref 0.0–0.2)

## 2018-09-07 LAB — IRON AND TIBC
Iron: 16 ug/dL — ABNORMAL LOW (ref 28–170)
Saturation Ratios: 12 % (ref 10.4–31.8)
TIBC: 134 ug/dL — ABNORMAL LOW (ref 250–450)
UIBC: 118 ug/dL

## 2018-09-07 LAB — BASIC METABOLIC PANEL
Anion gap: 10 (ref 5–15)
BUN: 86 mg/dL — AB (ref 6–20)
CO2: 20 mmol/L — ABNORMAL LOW (ref 22–32)
Calcium: 8.4 mg/dL — ABNORMAL LOW (ref 8.9–10.3)
Chloride: 119 mmol/L — ABNORMAL HIGH (ref 98–111)
Creatinine, Ser: 5.05 mg/dL — ABNORMAL HIGH (ref 0.44–1.00)
GFR calc Af Amer: 10 mL/min — ABNORMAL LOW (ref >60)
GFR calc non Af Amer: 9 mL/min — ABNORMAL LOW (ref >60)
Glucose, Bld: 117 mg/dL — ABNORMAL HIGH (ref 70–99)
POTASSIUM: 4.5 mmol/L (ref 3.5–5.1)
Sodium: 149 mmol/L — ABNORMAL HIGH (ref 135–145)

## 2018-09-07 LAB — FERRITIN: Ferritin: 1439 ng/mL — ABNORMAL HIGH (ref 11–307)

## 2018-09-07 MED ORDER — BOOST / RESOURCE BREEZE PO LIQD CUSTOM
1.0000 | Freq: Four times a day (QID) | ORAL | Status: DC
  Administered 2018-09-07: 1 via ORAL

## 2018-09-07 MED ORDER — BOOST / RESOURCE BREEZE PO LIQD CUSTOM
1.0000 | Freq: Three times a day (TID) | ORAL | Status: DC
  Administered 2018-09-07 – 2018-09-13 (×12): 1 via ORAL

## 2018-09-07 MED ORDER — QUETIAPINE FUMARATE 25 MG PO TABS
25.0000 mg | ORAL_TABLET | Freq: Every day | ORAL | Status: DC
  Administered 2018-09-07 – 2018-09-12 (×6): 25 mg via ORAL
  Filled 2018-09-07 (×7): qty 1

## 2018-09-07 MED ORDER — HYDRALAZINE HCL 10 MG PO TABS
10.0000 mg | ORAL_TABLET | Freq: Four times a day (QID) | ORAL | Status: DC
  Administered 2018-09-07 – 2018-09-08 (×5): 10 mg via ORAL
  Filled 2018-09-07 (×5): qty 1
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
## 2018-09-07 NOTE — Progress Notes (Signed)
Inpatient Rehabilitation-Admissions Coordinator   Met with pt as follow up from PM&R consult. Pt kept eyes shut throughout most of the conversation, appearing very fatigued. AC discussed reason for visit and that I will follow for tolerance to determine if pt appropriate for CIR as post acute program. Pt provided with booklets and pamphlet for information. Per pt, she has no family that can assist her at DC.   AC will continue to follow along, hoping to gather more information as condition and tolerance improves.   Please call if questions.   Jhonnie Garner, OTR/L  Rehab Admissions Coordinator  [phone] 09/07/2018 4:41 PM

## 2018-09-07 NOTE — Progress Notes (Signed)
[facility] for Infectious Disease  Date of Admission:  08/23/2018     Total days of antibiotics 20         ASSESSMENT/PLAN  [name] has resolving peritonitis, Fusobacterium/Enterococcus bacteremia as a result of rectal perforation requiring partial colon resection and diverting colostomy. She continues to remain afebrile with concerns for the hepatic abscess which she is refusing aspiration/drain by IR. Given that we do not have confirmed source control at present, recommend continuing meropenem for now. If able to complete aspiration could consider switching to oral antibiotics pending cultures.  1. Continue meropenem. 2. Monitor fevers and WBC count.   Principal Problem:   Fever Active Problems:   ARDS (adult respiratory distress syndrome) (HCC)   Acute respiratory failure with hypoxemia (HCC)   Stercoral ulcer of rectum   Rectal perforation (HCC)   Peritonitis (HCC)   Polymicrobial bacterial infection   Endotracheal tube present   History of ETT   Ileus ([place])   AKI (acute kidney injury) ([place])   . Chlorhexidine Gluconate Cloth  6 each Topical Daily  . guaiFENesin  30 mL Oral Q6H  . heparin  5,000 Units Subcutaneous Q8H  . hydrALAZINE  10 mg Oral Q6H  . mouth rinse  15 mL Mouth Rinse TID  . metoprolol tartrate  2.5 mg Intravenous Q6H  . morphine  15 mg Oral TID  . QUEtiapine  25 mg Oral BID  . sodium chloride flush  10-40 mL Intracatheter Q12H  . venlafaxine XR  37.5 mg Oral Q breakfast    SUBJECTIVE:  Afebrile overnight with stable WBC count of 14.5. Continues to refuse aspiration of liver abscess. Delirium noted overnight. Trouble with urination.   "I just want to go to sleep. My belly feels better."     Allergies  Allergen Reactions  . Bee Venom Swelling     Review of Systems: Review of Systems  Constitutional: Negative for chills and fever.  Respiratory: Negative for cough, shortness of breath and wheezing.   Cardiovascular: Negative for  chest pain and leg swelling.  Gastrointestinal: Positive for abdominal pain. Negative for constipation, diarrhea, nausea and vomiting.      OBJECTIVE: Vitals:   09/07/18 0600 09/07/18 0700 09/07/18 0723 09/07/18 0800  BP: (!) 168/105 (!) 173/107  (!) 158/101  Pulse: (!) 111 (!) 109  98  Resp: (!) 24 20  13   Temp:   99 F (37.2 C)   TempSrc:   Oral   SpO2: 93% 91%  92%  Weight:      Height:       Body mass index is 27.99 kg/m.  Physical Exam Constitutional:      General: She is not in acute distress.    Appearance: She is well-developed. She is ill-appearing.  Cardiovascular:     Rate and Rhythm: Regular rhythm. Tachycardia present.     Heart sounds: Normal heart sounds.  Pulmonary:     Effort: Pulmonary effort is normal.     Breath sounds: Normal breath sounds. No wheezing, rhonchi or rales.  Abdominal:     General: There is no distension.     Tenderness: There is no abdominal tenderness. There is no guarding or rebound.  Skin:    General: Skin is warm and dry.  Neurological:     Mental Status: She is oriented to person, place, and time. She is lethargic.  Psychiatric:        Behavior: Behavior normal.        Thought  Content: Thought content normal.        Judgment: Judgment normal.     Lab Results Lab Results  Component Value Date   WBC 14.5 (H) 09/07/2018   HGB 7.1 (L) 09/07/2018   HCT 23.4 (L) 09/07/2018   MCV 102.2 (H) 09/07/2018   PLT 847 (H) 09/07/2018    Lab Results  Component Value Date   CREATININE 5.05 (H) 09/07/2018   BUN 86 (H) 09/07/2018   NA 149 (H) 09/07/2018   K 4.5 09/07/2018   CL 119 (H) 09/07/2018   CO2 20 (L) 09/07/2018    Lab Results  Component Value Date   ALT 13 08/31/2018   AST 21 08/31/2018   ALKPHOS 60 08/31/2018   BILITOT 0.7 08/31/2018     Microbiology: Recent Results (from the past 240 hour(s))  Culture, respiratory (non-expectorated)     Status: None   Collection Time: 08/30/18  9:21 AM  Result Value Ref Range  Status   Specimen Description TRACHEAL ASPIRATE  Final   Special Requests NONE  Final   Gram Stain   Final    RARE WBC PRESENT, PREDOMINANTLY PMN RARE GRAM POSITIVE COCCI Performed at [facility], [place] [address], [place], [place] [address]    Culture RARE CANDIDA ALBICANS  Final   Report Status 09/02/2018 FINAL  Final  Culture, blood (routine x 2)     Status: None   Collection Time: 08/31/18  4:48 PM  Result Value Ref Range Status   Specimen Description BLOOD LEFT ANTECUBITAL  Final   Special Requests   Final    BOTTLES DRAWN AEROBIC ONLY Blood Culture adequate volume   Culture   Final    NO GROWTH 5 DAYS Performed at [facility], [place] [address] [place], [place] [address]    Report Status 09/05/2018 FINAL  Final  Culture, blood (routine x 2)     Status: None   Collection Time: 08/31/18  4:55 PM  Result Value Ref Range Status   Specimen Description BLOOD THUMB  Final   Special Requests   Final    BOTTLES DRAWN AEROBIC ONLY Blood Culture adequate volume   Culture   Final    NO GROWTH 5 DAYS Performed at [facility], [address], [place], [place] [address]    Report Status 09/05/2018 FINAL  Final     Terri Piedra, NP [facility] [phone] Pager  09/07/2018  9:34 AM

## 2018-09-07 NOTE — Consult Note (Signed)
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 require 24 hr/day rehab nursing? Yes 4. Does the patient require coordinated care of a physician, rehab nurse, PT (1-2 hrs/day, 5 days/week), OT (1-2 hrs/day, 5 days/week) and SLP (1-2 hrs/day, 5 days/week) to address physical and functional deficits in the context of the above medical diagnosis(es)? Yes Addressing deficits in the following areas: balance, endurance, locomotion, strength, transferring, bowel/bladder control, bathing, dressing, feeding, toileting, cognition and psychosocial support 5. Can the patient actively participate in an intensive therapy program of at least 3 hrs of therapy per day at least 5 days per week? No 6. The potential for patient to make measurable gains while on inpatient rehab is excellent 7. Anticipated functional outcomes upon discharge from inpatient rehab are supervision  with PT, supervision with OT, modified independent and supervision with SLP. 8. Estimated rehab length of stay to reach the above functional goals is: 20-25 days. 9. Anticipated D/C setting: Home 10. Anticipated post D/C treatments: HH therapy and Home excercise program 11. Overall Rehab/Functional Prognosis:  good  RECOMMENDATIONS: This patient's condition is appropriate for continued rehabilitative care in the following setting: Patient predominantly limited by pain and cognition at present.  Will follow along as these improve/pt able to tolerate 3 hours of therapy/day and consider CIR if deficits persist with appropriate discharge disposition.  Patient has agreed to participate in recommended program. Potentially Note that insurance prior authorization may be required for reimbursement for recommended care.  Comment: Rehab Admissions Coordinator to follow up.   I have personally performed a face to face diagnostic evaluation, including, but not limited to relevant history and physical exam findings, of this patient and developed relevant assessment and plan.  Additionally, I have reviewed and concur with the physician assistant's documentation above.   Delice Lesch, MD, ABPMR Lavon Paganini Angiulli, PA-C 09/07/2018

## 2018-09-07 NOTE — Progress Notes (Signed)
Patient ID: Julie Williams, female   DOB: 04-May-1966, 53 y.o.   MRN: [id number]   Please re order liver abscess aspiration/drain if need
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
## 2018-09-07 NOTE — Progress Notes (Signed)
Surgical Pathology report from [facility] :  - segments of benign sigmoid colon and proximal rectum with transmural discontinuity and acute inflammation into pericolonic tissue - hypertrophy of muscularis propria - no diverticulitis, adenomatous change or malignancy identified  Julie Williams , [facility] 09/07/2018, 2:05 PM
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
## 2018-09-07 NOTE — Progress Notes (Signed)
Physical Therapy Treatment Patient Details Name: Julie Williams MRN: [id number] DOB: 1966/07/17 Today's Date: 09/07/2018    History of Present Illness 53 year-old woman admitted to [facility] 12/24 with colonic perforation requiring colectomy, course complicated by aspiration pneumonia requiring mechanical ventilation 12/28 and bacteremia with fusobacterium and enterococcus, transferred to [facility] on 12/29 for management of ARDS.  She was extubated 09/04/17.  PMH includes: Rotator cuff syndrome Rt shoulder; cervical spine degeneration, essential HTN, depression, chronic pain with chronic opiate use     PT Comments    Pt received in bed with c/o of pain. Pt premedicated prior to session. Encouragement needed to participate. She required max assist supine to sit, +2 mod assist sit to stand, and +2 mod assist SPT with SW. Mobility limited by pain and weakness. Pt positioned in recliner with feet elevated at end of session.    Follow Up Recommendations  CIR     Equipment Recommendations  Other (comment)(TBA)    Recommendations for Other Services Rehab consult;OT consult     Precautions / Restrictions Precautions Precautions: Fall;Other (comment) Precaution Comments: abdominal wound, JP drain,  colostomy, watch BP    Mobility  Bed Mobility Overal bed mobility: Needs Assistance Bed Mobility: Supine to Sit     Supine to sit: Max assist     General bed mobility comments: cues for sequencing, assist with BLE and to elevate trunk  Transfers Overall transfer level: Needs assistance Equipment used: Standard walker Transfers: Sit to/from Stand;Stand Pivot Transfers Sit to Stand: +2 physical assistance;Mod assist Stand pivot transfers: +2 physical assistance;Mod assist       General transfer comment: assist to power up, maintain balance and manage SW. Cues for posture and sequencing.  Ambulation/Gait             General Gait Details: unable due to pain and  weakness   Stairs             Wheelchair Mobility    Modified Rankin (Stroke Patients Only)       Balance Overall balance assessment: Needs assistance Sitting-balance support: Feet supported Sitting balance-Leahy Scale: Fair Sitting balance - Comments: min guard assist sitting EOB   Standing balance support: Bilateral upper extremity supported;During functional activity Standing balance-Leahy Scale: Poor Standing balance comment: reliant on BUE and external support                            Cognition Arousal/Alertness: Awake/alert Behavior During Therapy: WFL for tasks assessed/performed Overall Cognitive Status: Impaired/Different from baseline Area of Impairment: Attention;Memory;Following commands;Safety/judgement;Awareness;Problem solving                   Current Attention Level: Sustained Memory: Decreased short-term memory Following Commands: Follows one step commands with increased time;Follows one step commands inconsistently Safety/Judgement: Decreased awareness of safety;Decreased awareness of deficits Awareness: Intellectual Problem Solving: Slow processing;Decreased initiation;Difficulty sequencing;Requires verbal cues;Requires tactile cues        Exercises      General Comments General comments (skin integrity, edema, etc.): VSS on 2 L O2      Pertinent Vitals/Pain Pain Assessment: Faces Faces Pain Scale: Hurts whole lot Pain Location: abdomen during mobility Pain Descriptors / Indicators: Grimacing;Guarding;Moaning Pain Intervention(s): Limited activity within patient's tolerance;Monitored during session;Repositioned;Premedicated before session    Home Living                      Prior Function  PT Goals (current goals can now be found in the care plan section) Acute Rehab PT Goals Patient Stated Goal: to go home and take care of chickens, dog and cat  PT Goal Formulation: With patient Time For  Goal Achievement: 09/19/18 Potential to Achieve Goals: Fair Progress towards PT goals: Progressing toward goals    Frequency    Min 3X/week      PT Plan Current plan remains appropriate    Co-evaluation              AM-PAC PT "6 Clicks" Mobility   Outcome Measure  Help needed turning from your back to your side while in a flat bed without using bedrails?: A Lot Help needed moving from lying on your back to sitting on the side of a flat bed without using bedrails?: A Lot Help needed moving to and from a bed to a chair (including a wheelchair)?: A Lot Help needed standing up from a chair using your arms (e.g., wheelchair or bedside chair)?: A Lot Help needed to walk in hospital room?: A Lot Help needed climbing 3-5 steps with a railing? : Total 6 Click Score: 11    End of Session Equipment Utilized During Treatment: Gait belt;Oxygen Activity Tolerance: Patient limited by pain Patient left: in chair;with call bell/phone within reach;with chair alarm set Nurse Communication: Mobility status PT Visit Diagnosis: Muscle weakness (generalized) (M62.81);Other abnormalities of gait and mobility (R26.89)     Time: 0263-7858 PT Time Calculation (min) (ACUTE ONLY): 25 min  Charges:  $Therapeutic Activity: 23-37 mins                     Lorrin Goodell, PT  Office # [phone] Pager [phone]    Lorriane Shire 09/07/2018, 12:31 PM

## 2018-09-08 ENCOUNTER — Inpatient Hospital Stay (HOSPITAL_COMMUNITY): Payer: PPO

## 2018-09-08 ENCOUNTER — Other Ambulatory Visit: Payer: Self-pay

## 2018-09-08 DIAGNOSIS — Z9889 Other specified postprocedural states: Secondary | ICD-10-CM

## 2018-09-08 DIAGNOSIS — Z933 Colostomy status: Secondary | ICD-10-CM

## 2018-09-08 DIAGNOSIS — N170 Acute kidney failure with tubular necrosis: Secondary | ICD-10-CM

## 2018-09-08 DIAGNOSIS — Z79899 Other long term (current) drug therapy: Secondary | ICD-10-CM

## 2018-09-08 DIAGNOSIS — R7881 Bacteremia: Secondary | ICD-10-CM

## 2018-09-08 DIAGNOSIS — J690 Pneumonitis due to inhalation of food and vomit: Secondary | ICD-10-CM

## 2018-09-08 DIAGNOSIS — F339 Major depressive disorder, recurrent, unspecified: Secondary | ICD-10-CM

## 2018-09-08 DIAGNOSIS — B952 Enterococcus as the cause of diseases classified elsewhere: Secondary | ICD-10-CM

## 2018-09-08 LAB — HEMOGLOBIN AND HEMATOCRIT, BLOOD
HCT: 25.5 % — ABNORMAL LOW (ref 36.0–46.0)
Hemoglobin: 8 g/dL — ABNORMAL LOW (ref 12.0–15.0)

## 2018-09-08 LAB — RENAL FUNCTION PANEL
Albumin: 1.4 g/dL — ABNORMAL LOW (ref 3.5–5.0)
Anion gap: 10 (ref 5–15)
BUN: 70 mg/dL — AB (ref 6–20)
CO2: 20 mmol/L — ABNORMAL LOW (ref 22–32)
Calcium: 8.1 mg/dL — ABNORMAL LOW (ref 8.9–10.3)
Chloride: 114 mmol/L — ABNORMAL HIGH (ref 98–111)
Creatinine, Ser: 4.35 mg/dL — ABNORMAL HIGH (ref 0.44–1.00)
GFR calc Af Amer: 13 mL/min — ABNORMAL LOW (ref >60)
GFR calc non Af Amer: 11 mL/min — ABNORMAL LOW (ref >60)
Glucose, Bld: 112 mg/dL — ABNORMAL HIGH (ref 70–99)
Phosphorus: 6.8 mg/dL — ABNORMAL HIGH (ref 2.5–4.6)
Potassium: 4.1 mmol/L (ref 3.5–5.1)
Sodium: 144 mmol/L (ref 135–145)

## 2018-09-08 LAB — CBC
HCT: 21.1 % — ABNORMAL LOW (ref 36.0–46.0)
Hemoglobin: 6.6 g/dL — CL (ref 12.0–15.0)
MCH: 32.2 pg (ref 26.0–34.0)
MCHC: 31.3 g/dL (ref 30.0–36.0)
MCV: 102.9 fL — ABNORMAL HIGH (ref 80.0–100.0)
Platelets: 718 10*3/uL — ABNORMAL HIGH (ref 150–400)
RBC: 2.05 MIL/uL — ABNORMAL LOW (ref 3.87–5.11)
RDW: 16.2 % — AB (ref 11.5–15.5)
WBC: 15.3 10*3/uL — AB (ref 4.0–10.5)
nRBC: 0 % (ref 0.0–0.2)

## 2018-09-08 LAB — GLUCOSE, CAPILLARY
Glucose-Capillary: 100 mg/dL — ABNORMAL HIGH (ref 70–99)
Glucose-Capillary: 79 mg/dL (ref 70–99)
Glucose-Capillary: 93 mg/dL (ref 70–99)

## 2018-09-08 LAB — PREPARE RBC (CROSSMATCH)

## 2018-09-08 MED ORDER — MIDAZOLAM HCL 2 MG/2ML IJ SOLN
INTRAMUSCULAR | Status: AC
  Filled 2018-09-08: qty 2

## 2018-09-08 MED ORDER — MIDAZOLAM HCL 2 MG/2ML IJ SOLN
INTRAMUSCULAR | Status: AC | PRN
  Administered 2018-09-08 (×2): 0.5 mg via INTRAVENOUS
  Administered 2018-09-08: 1 mg via INTRAVENOUS

## 2018-09-08 MED ORDER — SODIUM CHLORIDE 0.9 % IV SOLN
510.0000 mg | Freq: Once | INTRAVENOUS | Status: DC
  Filled 2018-09-08: qty 17

## 2018-09-08 MED ORDER — FUROSEMIDE 80 MG PO TABS
80.0000 mg | ORAL_TABLET | Freq: Two times a day (BID) | ORAL | Status: DC
  Administered 2018-09-08 (×2): 80 mg via ORAL
  Filled 2018-09-08 (×3): qty 1

## 2018-09-08 MED ORDER — HYDRALAZINE HCL 20 MG/ML IJ SOLN
10.0000 mg | Freq: Once | INTRAMUSCULAR | Status: AC
  Administered 2018-09-08: 10 mg via INTRAVENOUS
  Filled 2018-09-08: qty 1

## 2018-09-08 MED ORDER — LABETALOL HCL 5 MG/ML IV SOLN
10.0000 mg | INTRAVENOUS | Status: DC
  Administered 2018-09-08 – 2018-09-10 (×12): 10 mg via INTRAVENOUS
  Filled 2018-09-08 (×12): qty 4

## 2018-09-08 MED ORDER — SODIUM CHLORIDE 0.9% IV SOLUTION
Freq: Once | INTRAVENOUS | Status: AC
  Administered 2018-09-08: 12:00:00 via INTRAVENOUS

## 2018-09-08 MED ORDER — LIDOCAINE HCL (PF) 1 % IJ SOLN
INTRAMUSCULAR | Status: AC
  Filled 2018-09-08: qty 30

## 2018-09-08 MED ORDER — FENTANYL CITRATE (PF) 100 MCG/2ML IJ SOLN
INTRAMUSCULAR | Status: AC | PRN
  Administered 2018-09-08: 25 ug via INTRAVENOUS
  Administered 2018-09-08: 50 ug via INTRAVENOUS
  Administered 2018-09-08: 25 ug via INTRAVENOUS

## 2018-09-08 MED ORDER — FENTANYL CITRATE (PF) 100 MCG/2ML IJ SOLN
INTRAMUSCULAR | Status: AC
  Filled 2018-09-08: qty 2

## 2018-09-08 MED ORDER — SODIUM CHLORIDE 0.9 % IV SOLN
510.0000 mg | Freq: Once | INTRAVENOUS | Status: AC
  Administered 2018-09-08: 510 mg via INTRAVENOUS
  Filled 2018-09-08: qty 17

## 2018-09-08 MED ORDER — AMOXICILLIN-POT CLAVULANATE 500-125 MG PO TABS
500.0000 mg | ORAL_TABLET | Freq: Two times a day (BID) | ORAL | Status: DC
  Administered 2018-09-08 – 2018-09-13 (×10): 500 mg via ORAL
  Filled 2018-09-08 (×11): qty 1

## 2018-09-08 NOTE — Progress Notes (Signed)
Internal Medicine Attending:   I saw and examined the patient. I reviewed Dr Kathaleen Grinder note and I agree with the resident's findings and plan as documented in the resident's note. Has noted by Dr. Heber Reynolds as patient was originally admitted to [facility] for a colonic perforation which she underwent colectomy course was complicated by bacteremia, acute renal failure and eventually ARDS which required intubation and transferred to [facility].  Critical care care, surgery, nephrology and infectious disease have been involved in her care, as noted plan to drain a liver mass/abscess today by IR.  My evaluation of her she was in no acute distress she was tachycardic on exam appeared regular, lungs were grossly clear to auscultation abdomen was soft large abdominal bandage that is clean dry and intact.  Agree with assessment plan as documented by Dr. Heber Wilkinson.

## 2018-09-08 NOTE — Progress Notes (Signed)
PT Cancellation Note  Patient Details Name: Julie Williams MRN: [id number] DOB: [birth year]   Cancelled Treatment:    Reason Eval/Treat Not Completed: Patient at procedure or test/unavailable Will follow-up as time allows.    Lanney Gins, PT, DPT Supplemental Physical Therapist 09/08/18 2:10 PM Pager: [phone] Office: [phone]

## 2018-09-08 NOTE — Progress Notes (Signed)
[facility] Progress Note Patient Name: Julie Williams DOB: 1966/01/26 MRN: [id number]   Date of Service  09/08/2018  HPI/Events of Note  Anemia - Hgb = 6.6.  eICU Interventions  Will transfuse 1 unit PRBC now.      Intervention Category Major Interventions: Other:  Lysle Dingwall 09/08/2018, 5:23 AM

## 2018-09-08 NOTE — Progress Notes (Signed)
OT Cancellation Note  Patient Details Name: REYONNA HAACK MRN: [id number] DOB: 1965/12/02   Cancelled Treatment:    Reason Eval/Treat Not Completed: Patient at procedure or test/ unavailable Will continue to follow and continue OT POC as pt available and appropriate.   Zenovia Jarred, MSOT, OTR/L Behavioral Health OT/ Acute Relief OT [facility] Office: [phone]  Zenovia Jarred 09/08/2018, 3:22 PM

## 2018-09-08 NOTE — Procedures (Signed)
  Procedure: US aspiration perihepatic loculated collection 154ml tan fluid, sample for GS, C&S EBL:   minimal Complications:  none immediate  See full dictation in [facility].  Dillard Cannon MD Main # [phone] Pager  [phone]

## 2018-09-08 NOTE — Progress Notes (Signed)
No distress noted. Pt given orange sherbet which pt enjoyed.

## 2018-09-08 NOTE — Progress Notes (Signed)
[facility] Progress Note     Subjective: CC: pain with urination Patient complaining of pain with urination. Going for IR aspiration of liver abscess today. Reports intermittent abdominal pain. Ostomy working. Tolerating diet.   Objective: Vital signs in last 24 hours: Temp:  [99 F (37.2 C)-99.3 F (37.4 C)] 99.3 F (37.4 C) (01/13 2354) Pulse Rate:  [92-112] 97 (01/13 2354) Resp:  [12-25] 12 (01/13 1700) BP: (121-174)/(92-117) 158/100 (01/13 2354) SpO2:  [92 %-99 %] 98 % (01/13 2354) Last BM Date: 09/07/18(colostomy)  Intake/Output from previous day: 01/13 0701 - 01/14 0700 In: 2350.8 [I.V.:2250.7; IV Piggyback:100.1] Out: 1250 [Urine:875; Stool:375] Intake/Output this shift: No intake/output data recorded.  PE: Gen:  Alert, NAD, pleasant Card:  Regular rate and rhythm Pulm:  Normal effort Abd: Soft, mildly distended, mild generalized TTP, ostomy pink with brown stool in bag; midline wound with slough/necrosis at base with visible sutures     Lab Results:  Recent Labs    09/07/18 0430 09/08/18 0400  WBC 14.5* 15.3*  HGB 7.1* 6.6*  HCT 23.4* 21.1*  PLT 847* 718*   BMET Recent Labs    09/07/18 0430 09/08/18 0400  NA 149* 144  K 4.5 4.1  CL 119* 114*  CO2 20* 20*  GLUCOSE 117* 112*  BUN 86* 70*  CREATININE 5.05* 4.35*  CALCIUM 8.4* 8.1*   PT/INR No results for input(s): LABPROT, INR in the last 72 hours. CMP     Component Value Date/Time   NA 144 09/08/2018 0400   K 4.1 09/08/2018 0400   CL 114 (H) 09/08/2018 0400   CO2 20 (L) 09/08/2018 0400   GLUCOSE 112 (H) 09/08/2018 0400   BUN 70 (H) 09/08/2018 0400   CREATININE 4.35 (H) 09/08/2018 0400   CREATININE 0.72 04/28/2014 1201   CALCIUM 8.1 (L) 09/08/2018 0400   PROT 5.9 (L) 08/31/2018 0500   ALBUMIN 1.4 (L) 09/08/2018 0400   AST 21 08/31/2018 0500   ALT 13 08/31/2018 0500   ALKPHOS 60 08/31/2018 0500   BILITOT 0.7 08/31/2018 0500   GFRNONAA 11 (L) 09/08/2018 0400   GFRNONAA  >89 04/28/2014 1201   GFRAA 13 (L) 09/08/2018 0400   GFRAA >89 04/28/2014 1201   Lipase     Component Value Date/Time   LIPASE 87 (H) 08/23/2018 1834       Studies/Results: Dg Chest Port 1 View  Result Date: 09/07/2018 CLINICAL DATA:  Respiratory failure. EXAM: PORTABLE CHEST 1 VIEW COMPARISON:  09/06/2018.  09/03/2018.  09/01/2018. FINDINGS: Right PICC line in stable position. Heart size stable. Persistent right mid lung atelectasis and infiltrate. Diffuse bilateral interstitial prominence again noted. Similar findings noted on prior exam. No pleural effusion or pneumothorax. Cervical spine fusion. IMPRESSION: 1.  Right PICC line in stable position. 2. Right mid lung atelectasis and infiltrate. Diffuse bilateral pulmonary interstitial prominence. Similar findings noted on prior exams. Electronically Signed   By: Marcello Moores  Register   On: 09/07/2018 06:15    Anti-infectives: Anti-infectives (From admission, onward)   Start     Dose/Rate Route Frequency Ordered Stop   09/04/18 1121  meropenem (MERREM) 1 g in sodium chloride 0.9 % 100 mL IVPB     1 g 200 mL/hr over 30 Minutes Intravenous Every 24 hours 09/03/18 1709     08/30/18 1030  linezolid (ZYVOX) IVPB 600 mg  Status:  Discontinued     600 mg 300 mL/hr over 60 Minutes Intravenous Every 12 hours 08/30/18 0941 09/04/18 1437   08/28/18 2000  DAPTOmycin (CUBICIN) 640 mg in sodium chloride 0.9 % IVPB  Status:  Discontinued     640 mg 225.6 mL/hr over 30 Minutes Intravenous Every 48 hours 08/28/18 1330 08/30/18 0941   08/28/18 1400  meropenem (MERREM) 1 g in sodium chloride 0.9 % 100 mL IVPB  Status:  Discontinued     1 g 200 mL/hr over 30 Minutes Intravenous Every 12 hours 08/28/18 1330 09/03/18 1709   08/26/18 1030  DAPTOmycin (CUBICIN) 618.5 mg in sodium chloride 0.9 % IVPB  Status:  Discontinued     8 mg/kg  77.3 kg 224.7 mL/hr over 30 Minutes Intravenous Every 48 hours 08/26/18 0934 08/26/18 0935   08/26/18 1030  DAPTOmycin  (CUBICIN) 620 mg in sodium chloride 0.9 % IVPB  Status:  Discontinued     620 mg 224.8 mL/hr over 30 Minutes Intravenous Every 48 hours 08/26/18 0935 08/27/18 1038   08/25/18 0300  vancomycin (VANCOCIN) IVPB 1000 mg/200 mL premix  Status:  Discontinued     1,000 mg 200 mL/hr over 60 Minutes Intravenous  Once 08/24/18 0122 08/24/18 1049   08/24/18 0100  vancomycin (VANCOCIN) IVPB 1000 mg/200 mL premix  Status:  Discontinued     1,000 mg 200 mL/hr over 60 Minutes Intravenous Every 12 hours 08/24/18 0045 08/24/18 0122   08/24/18 0100  piperacillin-tazobactam (ZOSYN) IVPB 2.25 g  Status:  Discontinued     2.25 g 100 mL/hr over 30 Minutes Intravenous Every 6 hours 08/24/18 0045 08/28/18 1304       Assessment/Plan Enterococcal bacteremia,fusobacterium and enterococcus/aspiration pneumonia-ID following HTN GERD Depression Alcohol abuse Chronic pain/narcotic dependence -On Morphine at home Former smoker VDRF/ARDS- aspiration pneumonia -CCM Acute renal failure-creatinine 4.35 today, nephrology following Anemia-h/h 6.6/21.1 today, 1 unit PRBC ordered,s/p1 unit PRBCs1/6 Hypokalemia -improved, K 4.1  Perforated colon/rectum S/p exploratory laparotomy, resection distal sigmoid colon and proximal rectum, diverting colostomy, wound vac placement 12/25 Dr. Noberto Retort - POD20 -surgical path showed inflammation, no malignancy or diverticulitis identified -wound vac changed to wet to dryTID1/3/20 -JP drain removed 1/12 -CT scan 1/8 worsening pneumonia, no definite intraabdominal abscess or SBO; cannot rule out liver abscess  Liver Abscess - IR aspiration of liver abscess today   ID -vancomycin 12/30>>12/30;zosyn 12/30>>08/28/18,daptomycin 1/1>>1/5, linezolid 1/5>>1/10; Meropenem 1/3>>  VTE -SCDs, sq heparin FEN -IVF,dysphagia 1 diet Foley -none  Plan: Encourage PO intake.ContinueTIDwet to dry dressingchanges.IR procedure today.  LOS: 16 days    Brigid Re , [facility] 09/08/2018, 7:30 AM Pager: [phone] Consults: [phone] Mon-Fri 7:00 am-4:30 pm Sat-Sun 7:00 am-11:30 am

## 2018-09-08 NOTE — Progress Notes (Signed)
SLP Cancellation Note  Patient Details Name: RETIA CORDLE MRN: [id number] DOB: Dec 17, 1965   Cancelled treatment:       Reason Eval/Treat Not Completed: Patient at procedure or test/unavailable. Will continue efforts.  Reade Trefz L. Tivis Ringer, [place] CCC/SLP Acute Rehabilitation Services Office number [phone] Pager [phone]  Juan Quam Laurice 09/08/2018, 2:50 PM

## 2018-09-08 NOTE — Progress Notes (Signed)
Julie Williams  Date of Admission:  08/23/2018     Total days of antibiotics 21         ASSESSMENT/PLAN  Julie Williams has progressed out of the ICU and continues to receive antimicrobial therapy for rectal perforation requriing partial colon resection and diverting colostomy complicated by Fusobacterium/Enterococcus bacteremia, peritonitis and possible liver abscess. Scheduled to undergo IR aspiration and drain placement by IR today for liver abscess. She has remained clinically stable and afebrile. She has been reasonably treated for intra-abdominal infection with meropenem. Will begin to taper down antibiotic therapy and change her from meropenem to Augmentin. Treatment duration pending completion of liver abscess if she remains agreeable.   1. Discontinue meropenem and start Augmentin. 2. Await aspiration of liver abscess by IR.  3. Monitor fevers, WBC count and cultures. 4. Consider stopping antibiotics.   Principal Problem:   Rectal perforation ([place]) Active Problems:   Acute respiratory failure with hypoxemia (HCC)   Stercoral ulcer of rectum   History of ETT   AKI (acute kidney injury) ([place])   Dysphagia   Benign essential HTN   Tachycardia   Leukocytosis   Acute blood loss anemia   Hepatic abscess   Bacteremia due to Enterococcus   Colostomy in place [facility])   . sodium chloride   Intravenous Once  . amoxicillin-clavulanate  500 mg Oral Q12H  . Chlorhexidine Gluconate Cloth  6 each Topical Daily  . feeding supplement  1 Container Oral TID BM  . furosemide  80 mg Oral BID  . guaiFENesin  30 mL Oral Q6H  . heparin  5,000 Units Subcutaneous Q8H  . hydrALAZINE  10 mg Oral Q6H  . mouth rinse  15 mL Mouth Rinse TID  . metoprolol tartrate  2.5 mg Intravenous Q6H  . morphine  15 mg Oral TID  . QUEtiapine  25 mg Oral QHS  . sodium chloride flush  10-40 mL Intracatheter Q12H  . venlafaxine XR  37.5 mg Oral Q breakfast    SUBJECTIVE:  Mildly  elevated temperature today with no recent fevers. WBC count of 15.3.  Hemoglobin down to 6.6 with PRBC ordered. No acute events overnight. Surgery continues to follow with no surgical intervention at present.   Allergies  Allergen Reactions  . Bee Venom Swelling     Review of Systems: Review of Systems  Constitutional: Negative for chills, fever and malaise/fatigue.  Respiratory: Negative for cough, sputum production and shortness of breath.   Cardiovascular: Negative for chest pain and leg swelling.  Gastrointestinal: Positive for abdominal pain. Negative for diarrhea, nausea and vomiting.      OBJECTIVE: Vitals:   09/08/18 0732 09/08/18 0946 09/08/18 1005 09/08/18 1020  BP: (!) 165/107 (!) 180/115 (!) 172/108 (!) 181/107  Pulse:  (!) 120 (!) 119 (!) 120  Resp:  17 20 (!) 21  Temp:  99.4 F (37.4 C) 99.7 F (37.6 C) 100.1 F (37.8 C)  TempSrc:  Oral Oral Oral  SpO2:  96% 95% 97%  Weight:      Height:       Body mass index is 27.99 kg/m.  Physical Exam Vitals signs and nursing note reviewed.  Constitutional:      General: She is not in acute distress.    Appearance: She is well-developed and normal weight. She is ill-appearing.  Cardiovascular:     Rate and Rhythm: Regular rhythm. Tachycardia present.     Heart sounds: Normal heart sounds.  Pulmonary:  Effort: Pulmonary effort is normal.     Breath sounds: Normal breath sounds.  Abdominal:     General: Abdomen is flat. There is no distension.     Palpations: There is no mass.     Tenderness: There is no guarding or rebound.     Comments: Surgical dressing is clean, dry and without shadowing. Ostomy patent.   Skin:    General: Skin is warm and dry.  Neurological:     Mental Status: She is alert.     Lab Results Lab Results  Component Value Date   WBC 15.3 (H) 09/08/2018   HGB 6.6 (LL) 09/08/2018   HCT 21.1 (L) 09/08/2018   MCV 102.9 (H) 09/08/2018   PLT 718 (H) 09/08/2018    Lab Results    Component Value Date   CREATININE 4.35 (H) 09/08/2018   BUN 70 (H) 09/08/2018   NA 144 09/08/2018   K 4.1 09/08/2018   CL 114 (H) 09/08/2018   CO2 20 (L) 09/08/2018    Lab Results  Component Value Date   ALT 13 08/31/2018   AST 21 08/31/2018   ALKPHOS 60 08/31/2018   BILITOT 0.7 08/31/2018     Microbiology: Recent Results (from the past 240 hour(s))  Culture, respiratory (non-expectorated)     Status: None   Collection Time: 08/30/18  9:21 AM  Result Value Ref Range Status   Specimen Description TRACHEAL ASPIRATE  Final   Special Requests NONE  Final   Gram Stain   Final    RARE WBC PRESENT, PREDOMINANTLY PMN RARE GRAM POSITIVE COCCI Performed at [facility], [place] [address] [place], [place] [address]    Culture RARE CANDIDA ALBICANS  Final   Report Status 09/02/2018 FINAL  Final  Culture, blood (routine x 2)     Status: None   Collection Time: 08/31/18  4:48 PM  Result Value Ref Range Status   Specimen Description BLOOD LEFT ANTECUBITAL  Final   Special Requests   Final    BOTTLES DRAWN AEROBIC ONLY Blood Culture adequate volume   Culture   Final    NO GROWTH 5 DAYS Performed at [facility], [place] [address], [place], [place] [address]    Report Status 09/05/2018 FINAL  Final  Culture, blood (routine x 2)     Status: None   Collection Time: 08/31/18  4:55 PM  Result Value Ref Range Status   Specimen Description BLOOD THUMB  Final   Special Requests   Final    BOTTLES DRAWN AEROBIC ONLY Blood Culture adequate volume   Culture   Final    NO GROWTH 5 DAYS Performed at [facility], [address], [place], [place] [address]    Report Status 09/05/2018 FINAL  Final     Terri Piedra, NP [place] for [facility] [phone] Pager  09/08/2018  11:08 AM

## 2018-09-08 NOTE — Progress Notes (Signed)
[facility] Progress Note Patient Name: Julie Williams DOB: 1966-01-07 MRN: [id number]   Date of Service  09/08/2018  HPI/Events of Note  Hypertension - BP = 160/109. Not able to take ordered PO Hydralazine as NPO for IR procedure.   eICU Interventions  Will order: 1. Hydralazine 10 mg IV X 1 now.      Intervention Category Major Interventions: Hypertension - evaluation and management  Sommer,Steven Eugene 09/08/2018, 6:38 AM

## 2018-09-08 NOTE — Progress Notes (Signed)
Pt not back on unit. Charge nurse notified expecting pt and orders for vitals.

## 2018-09-08 NOTE — Progress Notes (Signed)
Transfer note:   Patient was admitted to [facility] on 12/24 with colonic perforation requiring colectomy. Course was complicated by bacteremia with fusobacterium and enterococcus and aspiration pneumonia requiring mechanical ventilation on 12/28.  She was transferred to [facility] on 12/29 for the management of ARDS.  On 1/10 she was extubated. And on 1/14 MTS was asked to resume care.  Subjective: Julie Williams She states she is thirsty and is having abdominal pain. She also mentions that there is a python or boa constrictor in her room.  Objective:  Vital signs in last 24 hours: Vitals:   09/08/18 0732 09/08/18 0946 09/08/18 1005 09/08/18 1020  BP: (!) 165/107 (!) 180/115 (!) 172/108 (!) 181/107  Pulse:  (!) 120 (!) 119 (!) 120  Resp:  17 20 (!) 21  Temp:  99.4 F (37.4 C) 99.7 F (37.6 C) 100.1 F (37.8 C)  TempSrc:  Oral Oral Oral  SpO2:  96% 95% 97%  Weight:      Height:       Physical Exam  HENT:  Head: Normocephalic and atraumatic.  Eyes: Pupils are equal, round, and reactive to light.  Cardiovascular: Normal rate and regular rhythm.  Tachycardic  Pulmonary/Chest: Effort normal and breath sounds normal. No respiratory distress. She has no wheezes. She has no rales.  Abdominal: Soft.  Wound with clean bandage over middle of abdomen  Neurological: She is alert.  Skin: Skin is warm and dry.     Assessment/Plan:  Principal Problem:   Rectal perforation (HCC) Active Problems:   Acute respiratory failure with hypoxemia (HCC)   Stercoral ulcer of rectum   History of ETT   AKI (acute kidney injury) ([place])   Dysphagia   Benign essential HTN   Tachycardia   Leukocytosis   Acute blood loss anemia   Hepatic abscess   Bacteremia due to Enterococcus   Colostomy in place [facility])  Perforated Colon/Rectum, s/p exploratory laparatomy with distal sigmoid and proximal rectum resection with colostomy on 12/24 Pathology reported the sigmoid colon was benign and proximal  rectum had acute inflammation without diverticulosis, adenomatous change or malignancy identified.  Colostomy and wound is being managed by general surgery.  Currently on wet to dry TID -surgery managing  -wet to dry dressing  Bacteremia with fusobacterium and enterococcus Hepatic abscess Leukocytosis Currently on meropenem but will likely de-escalate per ID as patient's fever has resolved and blood cultures with no growth to date.  TTE negative for endocarditis.  On 1/9 patient had a right upper quadrant abdominal ultrasound showed a large hypoechoic mass concerning for liver abscess. Plan is to have liver mass drained today by IR -Liver mass drain by IR, possibly today - ID following  Acute renal failure Multifactorial.  ATN 2/2 zosyn/vanc, septic shock.  Creatinine continues to down trend and is currently 4.3.  Has not required hemodialysis.  Patient does not have foley catheter or purwick placed. Will need strict I's and O's.  Nephrology following. Patient currently on Lasix. -strict I's and O's -purwick -nephrology following -Continue Lasix  Anemia of acute illness  Overnight hemoglobin dropped to 6.6 and patient is being transfused 1 unit of packed red blood cells currently.  Iron stores low and received IV feraheme.  She previously required a unit of packed red blood cells on 1/6. -follow-up H&H  Acute encephalopathy/chronic opiate Patient's mental status has waxed and waned throughout her clinical course. There was improvement in her mental status once her chronic opioids were restarted.  Currently a  little confused but able to hold a conversation well.   - MSIR 15 mg TID  - IV morphine 1-2 mg q4h PRN breakthrough pain  - seroquel 25 mg BID  Hypertension Afib w/rvr Had new onset Afib w/rvr while in the ICU but converted to sinus rhythm.  Per telemetry sinus tachycardia.  Obtaining repeat EKG.  -hydralazine  -metoprolol  Acute hypoxic respiratory failure/ARDS/aspiration  pneumonia Intubated on 12/28 for aspiration pneumonia/pneumonitis.  She required emergent bronchoscopy on 12/31 for mucus plugging.   She was extubated 1/10.  Currently on room air and in no respiratory distress.    Depression -continuing home effexor  VTE -SCDs, sq heparin FEN -IVF,dysphagia 1 diet  Dispo: Anticipated discharge pending clinical improvement.   Kalman Shan [place], DO 09/08/2018, 10:45 AM Pager: [phone]

## 2018-09-08 NOTE — Progress Notes (Signed)
Subjective: Interval History: has complaints pain with dressing change.  Objective: Vital signs in last 24 hours: Temp:  [99 F (37.2 C)-99.3 F (37.4 C)] 99.3 F (37.4 C) (01/13 2354) Pulse Rate:  [92-112] 97 (01/13 2354) Resp:  [12-25] 12 (01/13 1700) BP: (121-174)/(92-117) 165/107 (01/14 0732) SpO2:  [92 %-99 %] 98 % (01/13 2354) Weight change:   Intake/Output from previous day: 01/13 0701 - 01/14 0700 In: 2350.8 [I.V.:2250.7; IV Piggyback:100.1] Out: 1250 [Urine:875; Stool:375] Intake/Output this shift: No intake/output data recorded.  General appearance: cooperative, no distress, pale and slowed mentation Resp: diminished breath sounds bilaterally and rales bibasilar Cardio: S1, S2 normal and systolic murmur: systolic ejection 2/6, crescendo and decrescendo at 2nd left intercostal space GI: dressing lower mid abdm, pos bs, ostomy L mid abdm Extremities: edema 3+  Lab Results: Recent Labs    09/07/18 0430 09/08/18 0400  WBC 14.5* 15.3*  HGB 7.1* 6.6*  HCT 23.4* 21.1*  PLT 847* 718*   BMET:  Recent Labs    09/07/18 0430 09/08/18 0400  NA 149* 144  K 4.5 4.1  CL 119* 114*  CO2 20* 20*  GLUCOSE 117* 112*  BUN 86* 70*  CREATININE 5.05* 4.35*  CALCIUM 8.4* 8.1*   No results for input(s): PTH in the last 72 hours. Iron Studies:  Recent Labs    09/07/18 0730  IRON 16*  TIBC 134*  FERRITIN 1,439*    Studies/Results: Dg Chest Port 1 View  Result Date: 09/07/2018 CLINICAL DATA:  Respiratory failure. EXAM: PORTABLE CHEST 1 VIEW COMPARISON:  09/06/2018.  09/03/2018.  09/01/2018. FINDINGS: Right PICC line in stable position. Heart size stable. Persistent right mid lung atelectasis and infiltrate. Diffuse bilateral interstitial prominence again noted. Similar findings noted on prior exam. No pleural effusion or pneumothorax. Cervical spine fusion. IMPRESSION: 1.  Right PICC line in stable position. 2. Right mid lung atelectasis and infiltrate. Diffuse bilateral  pulmonary interstitial prominence. Similar findings noted on prior exams. Electronically Signed   By: Marcello Moores  Register   On: 09/07/2018 06:15    I have reviewed the patient's current medications.  Assessment/Plan: 1 AKI sepsis vs low bp with meds.  No better.  SNa ok so resume diuretics..  Cr slowly better, mild acidemia 2 Vol xs 3 sepsis with colonic perf/abscess fusobact/enterococcus 4 Narcotic addiction  Contrib to perf 5 Anemia give Fe  6 Resp failure lower vol P Lasix, Fe, Ab,    LOS: 16 days   Julie Williams 09/08/2018,8:01 AM

## 2018-09-08 NOTE — Progress Notes (Signed)
Pt spilled some medication, uta how much pt ingested at this time.

## 2018-09-09 DIAGNOSIS — G9341 Metabolic encephalopathy: Secondary | ICD-10-CM

## 2018-09-09 LAB — TYPE AND SCREEN
ABO/RH(D): B POS
Antibody Screen: NEGATIVE
Unit division: 0

## 2018-09-09 LAB — BPAM RBC
Blood Product Expiration Date: 202002102359
ISSUE DATE / TIME: 202001140936
Unit Type and Rh: 7300

## 2018-09-09 LAB — RENAL FUNCTION PANEL
Albumin: 1.6 g/dL — ABNORMAL LOW (ref 3.5–5.0)
Anion gap: 15 (ref 5–15)
BUN: 66 mg/dL — ABNORMAL HIGH (ref 6–20)
CHLORIDE: 114 mmol/L — AB (ref 98–111)
CO2: 21 mmol/L — AB (ref 22–32)
Calcium: 8.5 mg/dL — ABNORMAL LOW (ref 8.9–10.3)
Creatinine, Ser: 4.21 mg/dL — ABNORMAL HIGH (ref 0.44–1.00)
GFR calc Af Amer: 13 mL/min — ABNORMAL LOW (ref >60)
GFR calc non Af Amer: 11 mL/min — ABNORMAL LOW (ref >60)
Glucose, Bld: 105 mg/dL — ABNORMAL HIGH (ref 70–99)
Phosphorus: 7.2 mg/dL — ABNORMAL HIGH (ref 2.5–4.6)
Potassium: 3.9 mmol/L (ref 3.5–5.1)
Sodium: 150 mmol/L — ABNORMAL HIGH (ref 135–145)

## 2018-09-09 LAB — CBC
HCT: 26.2 % — ABNORMAL LOW (ref 36.0–46.0)
Hemoglobin: 8.6 g/dL — ABNORMAL LOW (ref 12.0–15.0)
MCH: 32.1 pg (ref 26.0–34.0)
MCHC: 32.8 g/dL (ref 30.0–36.0)
MCV: 97.8 fL (ref 80.0–100.0)
Platelets: 619 10*3/uL — ABNORMAL HIGH (ref 150–400)
RBC: 2.68 MIL/uL — ABNORMAL LOW (ref 3.87–5.11)
RDW: 16.4 % — ABNORMAL HIGH (ref 11.5–15.5)
WBC: 14.7 10*3/uL — ABNORMAL HIGH (ref 4.0–10.5)
nRBC: 0 % (ref 0.0–0.2)

## 2018-09-09 MED ORDER — AMLODIPINE BESYLATE 5 MG PO TABS
5.0000 mg | ORAL_TABLET | Freq: Every day | ORAL | Status: DC
  Administered 2018-09-09 – 2018-09-13 (×4): 5 mg via ORAL
  Filled 2018-09-09 (×5): qty 1

## 2018-09-09 MED ORDER — ONDANSETRON HCL 4 MG/2ML IJ SOLN
4.0000 mg | Freq: Four times a day (QID) | INTRAMUSCULAR | Status: DC | PRN
  Administered 2018-09-10: 4 mg via INTRAVENOUS
  Filled 2018-09-09: qty 2

## 2018-09-09 MED ORDER — DEXTROSE 5 % IV SOLN
INTRAVENOUS | Status: AC
  Administered 2018-09-09: 13:00:00 via INTRAVENOUS

## 2018-09-09 NOTE — Progress Notes (Signed)
Occupational Therapy Treatment Patient Details Name: Julie Williams MRN: [id number] DOB: February 10, 1966 Today's Date: 09/09/2018    History of present illness 53 year-old woman admitted to [facility] 12/24 with colonic perforation requiring colectomy, course complicated by aspiration pneumonia requiring mechanical ventilation 12/28 and bacteremia with fusobacterium and enterococcus, transferred to [facility] on 12/29 for management of ARDS.  She was extubated 09/04/17.  PMH includes: Rotator cuff syndrome Rt shoulder; cervical spine degeneration, essential HTN, depression, chronic pain with chronic opiate use    OT comments  Upon arrival  pt declined OOB mobility due to nausea.Today's session focused on self-care. Pt continues to demonstrate cognitive deficits, requiring multi modal cues for sequencing and initiation during ADL. Pt required max A  for rolling during bed mobility and required total assist +2 for pericare. Pt will continue to benefit from continued OT services to maximize independence and safety during ADL and functional mobility. Will continue to follow acutely.       Follow Up Recommendations  CIR;Supervision/Assistance - 24 hour    Equipment Recommendations  None recommended by OT    Recommendations for Other Services      Precautions / Restrictions Precautions Precautions: Fall;Other (comment) Precaution Comments: abdominal wound, JP drain,  colostomy, watch BP       Mobility Bed Mobility Overal bed mobility: Needs Assistance Bed Mobility: Rolling Rolling: Max assist         General bed mobility comments: cues for sequencing, assist with hand placement for pt to assist with rolling  Transfers                 General transfer comment: pt declined OOB mobility due to nausea     Balance       Sitting balance - Comments: pt declined OOB mobility                                    ADL either performed or assessed with clinical judgement    ADL Overall ADL's : Needs assistance/impaired                             Toileting- Clothing Manipulation and Hygiene: Total assistance +2 for physical assistance         General ADL Comments: pt declined OOB mobility and sitting EOB due to pt feeling nauseous with one episode of dry heaving. pt required max A to roll while supine for pericare.      Vision       Perception     Praxis      Cognition Arousal/Alertness: Lethargic Behavior During Therapy: Agitated Overall Cognitive Status: Impaired/Different from baseline Area of Impairment: Attention;Following commands;Safety/judgement;Problem solving;Memory                   Current Attention Level: Sustained Memory: Decreased short-term memory Following Commands: Follows one step commands with increased time Safety/Judgement: Decreased awareness of deficits Awareness: Intellectual Problem Solving: Requires verbal cues;Requires tactile cues;Difficulty sequencing;Decreased initiation;Slow processing General Comments: Pt required multimodal cues to sequence and initiate movement during bed mobility;pt required consistent cueing for participation during bed mobility        Exercises     Shoulder Instructions       General Comments      Pertinent Vitals/ Pain       Pain Assessment: Faces Faces Pain Scale: Hurts whole lot Pain Location: abdomen  during mobility Pain Descriptors / Indicators: Grimacing;Guarding;Moaning Pain Intervention(s): Monitored during session;Limited activity within patient's tolerance  Home Living                                          Prior Functioning/Environment              Frequency  Min 2X/week        Progress Toward Goals  OT Goals(current goals can now be found in the care plan section)  Progress towards OT goals: Progressing toward goals  Acute Rehab OT Goals Patient Stated Goal: to go home  OT Goal Formulation: With  patient Time For Goal Achievement: 09/23/18 Potential to Achieve Goals: Good ADL Goals Pt Will Perform Lower Body Dressing: with min assist;sit to/from stand Pt Will Perform Toileting - Clothing Manipulation and hygiene: with min assist;sit to/from stand  Plan Discharge plan remains appropriate    Co-evaluation    PT/OT/SLP Co-Evaluation/Treatment: Yes Reason for Co-Treatment: To address functional/ADL transfers   OT goals addressed during session: ADL's and self-care      AM-PAC OT "6 Clicks" Daily Activity     Outcome Measure   Help from another person eating meals?: A Lot Help from another person taking care of personal grooming?: A Lot Help from another person toileting, which includes using toliet, bedpan, or urinal?: Total Help from another person bathing (including washing, rinsing, drying)?: A Lot Help from another person to put on and taking off regular upper body clothing?: A Lot Help from another person to put on and taking off regular lower body clothing?: Total 6 Click Score: 10    End of Session    OT Visit Diagnosis: Unsteadiness on feet (R26.81);Muscle weakness (generalized) (M62.81)   Activity Tolerance Other (comment)(treatment limited secondary to nausea)   Patient Left in bed;with call bell/phone within reach;with nursing/sitter in room   Nurse Communication Mobility status        Time: 5409-8119 OT Time Calculation (min): 23 min  Charges: OT General Charges $OT Visit: 1 Visit OT Treatments $Self Care/Home Management : 8-22 mins  Dorinda Hill OTR/L Acute Rehabilitation Services Office: [place] 09/09/2018, 1:53 PM

## 2018-09-09 NOTE — Progress Notes (Signed)
Physical Therapy Treatment Patient Details Name: Julie Williams MRN: [id number] DOB: March 09, 1966 Today's Date: 09/09/2018    History of Present Illness 53 year-old woman admitted to [facility] 12/24 with colonic perforation requiring colectomy, course complicated by aspiration pneumonia requiring mechanical ventilation 12/28 and bacteremia with fusobacterium and enterococcus, transferred to [facility] on 12/29 for management of ARDS.  She was extubated 09/04/17.  PMH includes: Rotator cuff syndrome Rt shoulder; cervical spine degeneration, essential HTN, depression, chronic pain with chronic opiate use     PT Comments    Patient seen to attempt mobility progression. Patient with subjective reports of N&V limiting participation and tolerance to activity. Bed level treatment today due to symptoms as well as patient declining OOB mobility. Max A/Mod A +2 for rolling R<>L. Consistent cueing throughout session for sequencing to maximize efficiency with limited carryover. Will continue to follow.     Follow Up Recommendations  CIR     Equipment Recommendations  Other (comment)(defer)    Recommendations for Other Services Rehab consult;OT consult     Precautions / Restrictions Precautions Precautions: Fall;Other (comment) Precaution Comments: abdominal wound, JP drain,  colostomy, watch BP Restrictions Weight Bearing Restrictions: No    Mobility  Bed Mobility Overal bed mobility: Needs Assistance Bed Mobility: Rolling Rolling: Max assist         General bed mobility comments: cues for sequencing, assist with hand placement for pt to assist with rolling  Transfers                 General transfer comment: pt declined OOB mobility due to nausea   Ambulation/Gait                 Stairs             Wheelchair Mobility    Modified Rankin (Stroke Patients Only)       Balance       Sitting balance - Comments: pt declined OOB mobility                                     Cognition Arousal/Alertness: Lethargic Behavior During Therapy: Agitated Overall Cognitive Status: Impaired/Different from baseline Area of Impairment: Attention;Following commands;Safety/judgement;Problem solving;Memory                   Current Attention Level: Sustained Memory: Decreased short-term memory Following Commands: Follows one step commands with increased time Safety/Judgement: Decreased awareness of deficits Awareness: Intellectual Problem Solving: Requires verbal cues;Requires tactile cues;Difficulty sequencing;Decreased initiation;Slow processing General Comments: Pt required multimodal cues to sequence and initiate movement during bed mobility;pt required consistent cueing for participation during bed mobility      Exercises      General Comments General comments (skin integrity, edema, etc.): patient seemingly confused at times      Pertinent Vitals/Pain Pain Assessment: Faces Faces Pain Scale: Hurts whole lot Pain Location: abdomen during mobility Pain Descriptors / Indicators: Grimacing;Guarding;Moaning Pain Intervention(s): Limited activity within patient's tolerance;Monitored during session;Repositioned    Home Living                      Prior Function            PT Goals (current goals can now be found in the care plan section) Acute Rehab PT Goals Patient Stated Goal: to go home  PT Goal Formulation: With patient Time For Goal Achievement: 09/19/18 Potential to Achieve  Goals: Fair Progress towards PT goals: Progressing toward goals    Frequency    Min 3X/week      PT Plan Current plan remains appropriate    Co-evaluation PT/OT/SLP Co-Evaluation/Treatment: Yes Reason for Co-Treatment: Complexity of the patient's impairments (multi-system involvement);For patient/therapist safety;To address functional/ADL transfers PT goals addressed during session: Mobility/safety with  mobility;Strengthening/ROM OT goals addressed during session: ADL's and self-care      AM-PAC PT "6 Clicks" Mobility   Outcome Measure  Help needed turning from your back to your side while in a flat bed without using bedrails?: A Lot Help needed moving from lying on your back to sitting on the side of a flat bed without using bedrails?: A Lot Help needed moving to and from a bed to a chair (including a wheelchair)?: A Lot Help needed standing up from a chair using your arms (e.g., wheelchair or bedside chair)?: A Lot Help needed to walk in hospital room?: A Lot Help needed climbing 3-5 steps with a railing? : Total 6 Click Score: 11    End of Session   Activity Tolerance: Treatment limited secondary to medical complications (Comment);Patient limited by fatigue(N&V) Patient left: in bed;with call bell/phone within reach;with nursing/sitter in room Nurse Communication: Mobility status PT Visit Diagnosis: Muscle weakness (generalized) (M62.81);Other abnormalities of gait and mobility (R26.89)     Time: 1000-1020 PT Time Calculation (min) (ACUTE ONLY): 20 min  Charges:  $Therapeutic Activity: 8-22 mins                     Lanney Gins, PT, DPT Supplemental Physical Therapist 09/09/18 3:11 PM Pager: [phone] Office: [phone]

## 2018-09-09 NOTE — Progress Notes (Signed)
Subjective: Interval History: has complaints there is a snake on the wall.  Objective: Vital signs in last 24 hours: Temp:  [98.1 F (36.7 C)-100.1 F (37.8 C)] 99.5 F (37.5 C) (01/15 0002) Pulse Rate:  [98-120] 98 (01/15 0002) Resp:  [17-23] 20 (01/15 0002) BP: (144-181)/(92-115) 151/96 (01/15 0002) SpO2:  [91 %-97 %] 95 % (01/15 0002) Weight:  [67.5 kg] 67.5 kg (01/15 0509) Weight change:   Intake/Output from previous day: 01/14 0701 - 01/15 0700 In: 584.6 [I.V.:60; Blood:524.6] Out: 730 [Urine:550] Intake/Output this shift: No intake/output data recorded.  General appearance: alert, no distress, pale, slowed mentation and confused Resp: diminished breath sounds bilaterally Cardio: S1, S2 normal and systolic murmur: systolic ejection 2/6, crescendo and decrescendo at 2nd left intercostal space GI: dressing midline, ostomy L mid abdm, decreased bs Extremities: edema 2+  Lab Results: Recent Labs    09/07/18 0430 09/08/18 0400 09/08/18 1130  WBC 14.5* 15.3*  --   HGB 7.1* 6.6* 8.0*  HCT 23.4* 21.1* 25.5*  PLT 847* 718*  --    BMET:  Recent Labs    09/07/18 0430 09/08/18 0400  NA 149* 144  K 4.5 4.1  CL 119* 114*  CO2 20* 20*  GLUCOSE 117* 112*  BUN 86* 70*  CREATININE 5.05* 4.35*  CALCIUM 8.4* 8.1*   No results for input(s): PTH in the last 72 hours. Iron Studies:  Recent Labs    09/07/18 0730  IRON 16*  TIBC 134*  FERRITIN 1,439*    Studies/Results: [place] Image Guided Drainage By Percutaneous Catheter  Result Date: 09/08/2018 INDICATION: History of rectal perforation, ARDS. Perihepatic collections suspected on CT and ultrasound studies. Aspiration requested. EXAM: ULTRASOUND GUIDED ASPIRATION OF PERIHEPATIC COLLECTION MEDICATIONS: lidocaine 1% subcutaneous ANESTHESIA/SEDATION: Intravenous Fentanyl and Versed were administered as conscious sedation during continuous monitoring of the patient's level of consciousness and physiological / cardiorespiratory  status by the radiology RN, with a total moderate sedation time of 20 minutes. PROCEDURE: The procedure, risks, benefits, and alternatives were explained to the patient. Questions regarding the procedure were encouraged and answered. The patient understands and consents to the procedure. Survey ultrasound of the right upper quadrant was performed. Perihepatic collection was localized and an appropriate skin entry site was determined. The operative field was prepped with chlorhexidine in a sterile fashion, and a sterile drape was applied covering the operative field. A sterile gown and sterile gloves were used for the procedure. Local anesthesia was provided with 1% Lidocaine. Under real-time ultrasound guidance, an 18 gauge trocar needle was advanced into the collection. Approximately 10 mL of beige opaque fluid were aspirated, but on ultrasound a significant amount of the collection persisted. Therefore, the trocar needle was removed and a 10 cm multi sidehole Yueh sheath needle was advanced under ultrasound guidance into the collection. This facilitated aspiration of another 170 mL of opaque fluid. Because of the potential for pleural traversal, the tract was not dilated for drain placement. Small residual of complex material was identified on the final image after removal of the Yueh sheath. No evidence of hemorrhage. The patient tolerated the procedure well. COMPLICATIONS: None immediate. FINDINGS: Perihepatic collection was localized. 180 mL beige opaque fluid aspirated as detailed above, and a sample sent for Gram stain and culture. IMPRESSION: 1. Technically successful ultrasound-guided aspiration of perihepatic abscess, removing 180 mL, sample sent for Gram stain and culture. Electronically Signed   By: Lucrezia Europe M.D.   On: 09/08/2018 16:49    I have reviewed the patient's  current medications.  Assessment/Plan: 1 AKI chem pending ,  Urine ???. Some vol xs 2 Sepsis enterococcus, fuso on Augmentin now.   Low grade temp. Asp or liver abscess yest 3 Colonic perf,  4 Anemia Transfused ? Blood loss, given Fe 5 Encephalopathy ? With drawal 6 Narcotic addiction P I&O, check chem , cont lasix, AB , wound care   LOS: 17 days   Shuan Statzer 09/09/2018,7:07 AM

## 2018-09-09 NOTE — Clinical Social Work Note (Signed)
Clinical Social Work Assessment  Patient Details  Name: Julie Williams MRN: [id number] Date of Birth: 04/17/1966  Date of referral:                  Reason for consult:  Facility Placement                Permission sought to share information with:  (No family supports) Permission granted to share information::     Name::        Agency::     Relationship::     Contact Information:     Housing/Transportation Living arrangements for the past 2 months:  Single Family Home Source of Information:  Patient Patient Interpreter Needed:  None Criminal Activity/Legal Involvement Pertinent to Current Situation/Hospitalization:  No - Comment as needed Significant Relationships:  None Lives with:  Self Do you feel safe going back to the place where you live?  No Need for family participation in patient care:  No (Coment)  Care giving concerns:  Pt is alert and oriented. No family or friends present at bedside.   Social Worker assessment / plan:  CSW spoke with pt at bedside. Pt states she lives alone and does not have any family or friends who she cant "count on." Pt was concerned about her 55 and belongings. CSW asked RN--unsure at this time. CIR will be unable to take pt due to lack of support at d/c. Pt understanding. CSW provided pt with Medicare generated SNF list at bedside to look over. CSW to follow up with pt in AM.  Employment status:    Insurance information:  Managed Medicare PT Recommendations:  Inpatient Rehab Consult Information / Referral to community resources:  [place]  Patient/Family's Response to care:  Pt verbalized understanding of CSW role and expressed appreciation for support. Pt denies any concern regarding pt care at this time.   Patient/Family's Understanding of and Emotional Response to Diagnosis, Current Treatment, and Prognosis:  Pt understanding and realistic regarding physical limitations. Pt understands the need for SNF placement  at d/c. Pt agreeable to SNF placement at d/c, at this time. Pt's responses emotionally appropriate during conversation with CSW. Pt denies any concern regarding treatment plan at this time. CSW will continue to provide support and facilitate d/c needs.   Emotional Assessment Appearance:  Appears stated age Attitude/Demeanor/Rapport:  (patient was appropriate) Affect (typically observed):  Accepting, Appropriate, Calm Orientation:  Oriented to Situation, Oriented to  Time, Oriented to Place, Oriented to Self Alcohol / Substance use:  Not Applicable Psych involvement (Current and /or in the community):  No (Comment)  Discharge Needs  Concerns to be addressed:  Care Coordination, Basic Needs Readmission within the last 30 days:  No Current discharge risk:  Dependent with Mobility, Lives alone Barriers to Discharge:  Continued Medical Work up   [employer], LCSW 09/09/2018, 4:18 PM

## 2018-09-09 NOTE — Progress Notes (Addendum)
[facility] Progress Note     Subjective: CC: confusion Patient oriented to person and time this AM, not to place or situation. Per RN increased hallucinations overnight. When asked about abdominal pain, she reports constant pain. States she is hungry and thirsty this AM. Wants apple juice and repeatedly asking for this. Ostomy working.  Objective: Vital signs in last 24 hours: Temp:  [98.1 F (36.7 C)-100.1 F (37.8 C)] 99.5 F (37.5 C) (01/15 0002) Pulse Rate:  [98-120] 98 (01/15 0002) Resp:  [17-23] 20 (01/15 0002) BP: (144-181)/(92-115) 151/96 (01/15 0002) SpO2:  [91 %-97 %] 95 % (01/15 0002) Weight:  [67.5 kg] 67.5 kg (01/15 0509) Last BM Date: 09/08/18  Intake/Output from previous day: 01/14 0701 - 01/15 0700 In: 584.6 [I.V.:60; Blood:524.6] Out: 730 [Urine:550] Intake/Output this shift: No intake/output data recorded.  PE: Gen:  Alert, NAD, pleasant Card:  Regular rate and rhythm Pulm:  Normal effort Abd: Soft,mildlydistended,mild generalized TTP, ostomy pink with brown stool in bag; midline wound with slough/necrosis at base with visible sutures     Lab Results:  Recent Labs    09/08/18 0400 09/08/18 1130 09/09/18 0643  WBC 15.3*  --  14.7*  HGB 6.6* 8.0* 8.6*  HCT 21.1* 25.5* 26.2*  PLT 718*  --  619*   BMET Recent Labs    09/08/18 0400 09/09/18 0644  NA 144 150*  K 4.1 3.9  CL 114* 114*  CO2 20* 21*  GLUCOSE 112* 105*  BUN 70* 66*  CREATININE 4.35* 4.21*  CALCIUM 8.1* 8.5*   PT/INR No results for input(s): LABPROT, INR in the last 72 hours. CMP     Component Value Date/Time   NA 150 (H) 09/09/2018 0644   K 3.9 09/09/2018 0644   CL 114 (H) 09/09/2018 0644   CO2 21 (L) 09/09/2018 0644   GLUCOSE 105 (H) 09/09/2018 0644   BUN 66 (H) 09/09/2018 0644   CREATININE 4.21 (H) 09/09/2018 0644   CREATININE 0.72 04/28/2014 1201   CALCIUM 8.5 (L) 09/09/2018 0644   PROT 5.9 (L) 08/31/2018 0500   ALBUMIN 1.6 (L) 09/09/2018 0644     AST 21 08/31/2018 0500   ALT 13 08/31/2018 0500   ALKPHOS 60 08/31/2018 0500   BILITOT 0.7 08/31/2018 0500   GFRNONAA 11 (L) 09/09/2018 0644   GFRNONAA >89 04/28/2014 1201   GFRAA 13 (L) 09/09/2018 0644   GFRAA >89 04/28/2014 1201   Lipase     Component Value Date/Time   LIPASE 87 (H) 08/23/2018 1834       Studies/Results: [place] Image Guided Drainage By Percutaneous Catheter  Result Date: 09/08/2018 INDICATION: History of rectal perforation, ARDS. Perihepatic collections suspected on CT and ultrasound studies. Aspiration requested. EXAM: ULTRASOUND GUIDED ASPIRATION OF PERIHEPATIC COLLECTION MEDICATIONS: lidocaine 1% subcutaneous ANESTHESIA/SEDATION: Intravenous Fentanyl and Versed were administered as conscious sedation during continuous monitoring of the patient's level of consciousness and physiological / cardiorespiratory status by the radiology RN, with a total moderate sedation time of 20 minutes. PROCEDURE: The procedure, risks, benefits, and alternatives were explained to the patient. Questions regarding the procedure were encouraged and answered. The patient understands and consents to the procedure. Survey ultrasound of the right upper quadrant was performed. Perihepatic collection was localized and an appropriate skin entry site was determined. The operative field was prepped with chlorhexidine in a sterile fashion, and a sterile drape was applied covering the operative field. A sterile gown and sterile gloves were used for the procedure. Local anesthesia was provided with  1% Lidocaine. Under real-time ultrasound guidance, an 18 gauge trocar needle was advanced into the collection. Approximately 10 mL of beige opaque fluid were aspirated, but on ultrasound a significant amount of the collection persisted. Therefore, the trocar needle was removed and a 10 cm multi sidehole Yueh sheath needle was advanced under ultrasound guidance into the collection. This facilitated aspiration of  another 170 mL of opaque fluid. Because of the potential for pleural traversal, the tract was not dilated for drain placement. Small residual of complex material was identified on the final image after removal of the Yueh sheath. No evidence of hemorrhage. The patient tolerated the procedure well. COMPLICATIONS: None immediate. FINDINGS: Perihepatic collection was localized. 180 mL beige opaque fluid aspirated as detailed above, and a sample sent for Gram stain and culture. IMPRESSION: 1. Technically successful ultrasound-guided aspiration of perihepatic abscess, removing 180 mL, sample sent for Gram stain and culture. Electronically Signed   By: Lucrezia Europe M.D.   On: 09/08/2018 16:49    Anti-infectives: Anti-infectives (From admission, onward)   Start     Dose/Rate Route Frequency Ordered Stop   09/08/18 1030  amoxicillin-clavulanate (AUGMENTIN) 500-125 MG per tablet 500 mg     500 mg Oral Every 12 hours 09/08/18 1020     09/04/18 1121  meropenem (MERREM) 1 g in sodium chloride 0.9 % 100 mL IVPB  Status:  Discontinued     1 g 200 mL/hr over 30 Minutes Intravenous Every 24 hours 09/03/18 1709 09/08/18 1020   08/30/18 1030  linezolid (ZYVOX) IVPB 600 mg  Status:  Discontinued     600 mg 300 mL/hr over 60 Minutes Intravenous Every 12 hours 08/30/18 0941 09/04/18 1437   08/28/18 2000  DAPTOmycin (CUBICIN) 640 mg in sodium chloride 0.9 % IVPB  Status:  Discontinued     640 mg 225.6 mL/hr over 30 Minutes Intravenous Every 48 hours 08/28/18 1330 08/30/18 0941   08/28/18 1400  meropenem (MERREM) 1 g in sodium chloride 0.9 % 100 mL IVPB  Status:  Discontinued     1 g 200 mL/hr over 30 Minutes Intravenous Every 12 hours 08/28/18 1330 09/03/18 1709   08/26/18 1030  DAPTOmycin (CUBICIN) 618.5 mg in sodium chloride 0.9 % IVPB  Status:  Discontinued     8 mg/kg  77.3 kg 224.7 mL/hr over 30 Minutes Intravenous Every 48 hours 08/26/18 0934 08/26/18 0935   08/26/18 1030  DAPTOmycin (CUBICIN) 620 mg in sodium  chloride 0.9 % IVPB  Status:  Discontinued     620 mg 224.8 mL/hr over 30 Minutes Intravenous Every 48 hours 08/26/18 0935 08/27/18 1038   08/25/18 0300  vancomycin (VANCOCIN) IVPB 1000 mg/200 mL premix  Status:  Discontinued     1,000 mg 200 mL/hr over 60 Minutes Intravenous  Once 08/24/18 0122 08/24/18 1049   08/24/18 0100  vancomycin (VANCOCIN) IVPB 1000 mg/200 mL premix  Status:  Discontinued     1,000 mg 200 mL/hr over 60 Minutes Intravenous Every 12 hours 08/24/18 0045 08/24/18 0122   08/24/18 0100  piperacillin-tazobactam (ZOSYN) IVPB 2.25 g  Status:  Discontinued     2.25 g 100 mL/hr over 30 Minutes Intravenous Every 6 hours 08/24/18 0045 08/28/18 1304       Assessment/Plan Enterococcal bacteremia,fusobacterium and enterococcus/aspiration pneumonia-ID following HTN GERD Depression Alcohol abuse Chronic pain/narcotic dependence -On Morphine at home Former smoker VDRF/ARDS- aspiration pneumonia, resp cx grew few candida albicans Acute renal failure-creatinine4.21 today,nephrology following Anemia-h/h 8.6/26.2 today, s/p1 unit PRBCs1/6 and 1/14 Hypokalemia -  improved, K 3.9  Perforated colon/rectum S/p exploratory laparotomy, resection distal sigmoid colon and proximal rectum, diverting colostomy, wound vac placement 12/25 Dr. Noberto Retort - POD 21 -surgical path showed inflammation, no malignancy or diverticulitis identified -wound vac changed to wet to dryTID1/3/20 -JP drain removed 1/12 -CT scan 1/8 worsening pneumonia, no definite intraabdominal abscess or SBO; cannot rule out liver abscess  Liver Abscess - IR aspiration of liver abscess 1/14, cxs pending  ID -vancomycin 12/30>>12/30;zosyn 12/30>>08/28/18,daptomycin 1/1>>1/5, linezolid 1/5>>1/10;Meropenem 1/3>1/14; PO augmentin 1/14>> VTE -SCDs, sq heparin FEN -IVF,dysphagia 1 diet Foley -none  Plan: Encourage PO intake.ContinueTIDwet to dry dressingchanges.  LOS: 17 days     Julie Williams , [facility] 09/09/2018, 7:56 AM Pager: [phone] Consults: [phone] Mon-Fri 7:00 am-4:30 pm Sat-Sun 7:00 am-11:30 am

## 2018-09-09 NOTE — Progress Notes (Addendum)
Inpatient Rehabilitation-Admissions Coordinator   Met with pt at the bedside. Pt does not have the caregiver assistance recommended for a CIR admission. AC has communicated need for new dispo plans with CM and SW.  AC will sign off.   Jhonnie Garner, OTR/L  Rehab Admissions Coordinator  [phone] 09/09/2018 4:19 PM

## 2018-09-09 NOTE — NC FL2 (Signed)
[place] LEVEL OF CARE SCREENING TOOL     IDENTIFICATION  Patient Name: Julie Williams Birthdate: 1966-08-22 Sex: female Admission Date (Current Location): 08/23/2018  [facility] and [place] Number:  Herbalist and Address:  [facility] [place], [place] [address], [place], [place] [address]      Provider Number: [id number]  Attending Physician Name and Address:  Lucious Groves, DO  Relative Name and Phone Number:       Current Level of Care: Hospital Recommended Level of Care: [place] Prior Approval Number:    Date Approved/Denied:   PASRR Number:    Discharge Plan: SNF    Current Diagnoses: Patient Active Problem List   Diagnosis Date Noted  . Bacteremia due to Enterococcus 09/08/2018  . Colostomy in place [facility]) 09/08/2018  . Dysphagia   . Benign essential HTN   . Tachycardia   . Leukocytosis   . Acute blood loss anemia   . Hepatic abscess   . AKI (acute kidney injury) ([place])   . History of ETT   . Stercoral ulcer of rectum 08/27/2018  . Rectal perforation s/p partial colectomy an ddiverting colostomy 08/27/2018  . Acute respiratory failure with hypoxemia ([place])   . Elevated blood pressure reading 08/16/2018  . Wheezing 04/08/2018  . Chest pain 12/14/2017  . Osteoarthritis of right acromioclavicular joint 11/10/2017  . Agitation 09/07/2017  . Vaginal atrophy 07/05/2016  . Throat discomfort 05/30/2016  . Preop cardiovascular exam 05/20/2016  . Acromioclavicular joint arthritis 12/25/2015  . Essential hypertension 07/10/2015  . Cervical spine degeneration 06/19/2015  . S/P complete repair of rotator cuff 04/13/2015  . Complete tear of right rotator cuff 01/06/2015  . S/P cervical spinal fusion 01/06/2015  . GERD (gastroesophageal reflux disease) 11/07/2014  . Hoarseness 04/08/2013  . Hearing loss 03/04/2013  . Tonsil pain 03/04/2013  . History of colonic polyps 09/13/2011  . Hypertension with goal to  be determined 09/13/2011  . Tobacco use disorder 07/10/2011  . Long term (current) use of opiate analgesic 06/04/2011  . Chronic neck pain 06/04/2011  . Pain in right shoulder 06/04/2011  . Depression 07/17/2009  . Rotator cuff syndrome of right shoulder 06/29/2009    Orientation RESPIRATION BLADDER Height & Weight     Self, Time, Situation, Place  Normal External catheter, Incontinent(placed 1/14) Weight: 148 lb 13 oz (67.5 kg) Height:  5\' 5"  (165.1 cm)  BEHAVIORAL SYMPTOMS/MOOD NEUROLOGICAL BOWEL NUTRITION STATUS      Continent Diet(DYS 1 diet, nectar thick diet)  AMBULATORY STATUS COMMUNICATION OF NEEDS Skin   Extensive Assist Verbally Surgical wounds(closed incision on abdomen, Moist to dry;ABD,change TID. open wound on ab)                       [place], Feeding, Dressing Bathing Assistance: Maximum assistance Feeding assistance: Limited assistance Dressing Assistance: Maximum assistance     Functional Limitations Info  Sight, Hearing, Speech Sight Info: Adequate Hearing Info: Adequate Speech Info: Adequate    SPECIAL CARE FACTORS FREQUENCY  PT (By licensed PT), OT (By licensed OT)     PT Frequency: 3x OT Frequency: 3x            Contractures Contractures Info: Not present    Additional Factors Info  Code Status, Allergies Code Status Info: Full Code Allergies Info: bee venom           Current Medications (09/09/2018):  This is the current hospital active medication list Current Facility-Administered Medications  Medication Dose Route Frequency Provider Last Rate Last Dose  . 0.9 %  sodium chloride infusion   Intravenous PRN Juanito Doom, MD   Stopped at 09/07/18 1608  . acetaminophen (TYLENOL) solution 650 mg  650 mg Per Tube Q6H PRN Juanito Doom, MD   650 mg at 09/02/18 0025  . amLODipine (NORVASC) tablet 5 mg  5 mg Oral Daily [name], MD   5 mg at 09/09/18 1303  .  amoxicillin-clavulanate (AUGMENTIN) 500-125 MG per tablet 500 mg  500 mg Oral Q12H Michel Bickers, MD   500 mg at 09/08/18 2140  . Chlorhexidine Gluconate Cloth 2 % PADS 6 each  6 each Topical Daily Juanito Doom, MD   6 each at 09/09/18 [phone]  . dextrose 5 % solution   Intravenous Continuous [name], MD 100 mL/hr at 09/09/18 1310    . feeding supplement (BOOST / RESOURCE BREEZE) liquid 1 Container  1 Container Oral TID BM Chesley Mires, MD   1 Container at 09/09/18 1309  . guaiFENesin (ROBITUSSIN) 100 MG/5ML solution 600 mg  30 mL Oral Q6H Simonne Maffucci B, MD   600 mg at 09/09/18 0627  . heparin injection 5,000 Units  5,000 Units Subcutaneous Q8H Rush Farmer, MD   5,000 Units at 09/09/18 1307  . labetalol (NORMODYNE,TRANDATE) injection 10 mg  10 mg Intravenous Q4H [name], MD   10 mg at 09/09/18 1305  . MEDLINE mouth rinse  15 mL Mouth Rinse TID Rigoberto Noel, MD   15 mL at 09/08/18 2142  . morphine (MSIR) tablet 15 mg  15 mg Oral TID Rigoberto Noel, MD   15 mg at 09/09/18 1303  . morphine 2 MG/ML injection 1-2 mg  1-2 mg Intravenous Q4H PRN Ledell Noss, MD   1 mg at 09/08/18 1015  . ondansetron (ZOFRAN) injection 4 mg  4 mg Intravenous Q6H PRN Santos-Sanchez, Idalys, MD      . QUEtiapine (SEROQUEL) tablet 25 mg  25 mg Oral QHS Ledell Noss, MD   25 mg at 09/08/18 2141  . RESOURCE THICKENUP CLEAR   Oral PRN Simonne Maffucci B, MD      . sodium chloride flush (NS) 0.9 % injection 10-40 mL  10-40 mL Intracatheter Q12H Juanito Doom, MD   10 mL at 09/09/18 0923  . sodium chloride flush (NS) 0.9 % injection 10-40 mL  10-40 mL Intracatheter PRN Simonne Maffucci B, MD      . venlafaxine XR (EFFEXOR-XR) 24 hr capsule 37.5 mg  37.5 mg Oral Q breakfast Ledell Noss, MD   37.5 mg at 09/09/18 4166     Discharge Medications: Please see discharge summary for a list of discharge medications.  Relevant Imaging Results:  Relevant Lab Results:   Additional Information SSN:  [id number]  Eileen Stanford, LCSW

## 2018-09-09 NOTE — Care Management Note (Signed)
Case Management Note  Patient Details  Name: Julie Williams MRN: [id number] Date of Birth: [birth year]  Subjective/Objective:    NCM spoke with patient , she states she will be at home alone when she is discharged from [facility] , she has one sister in [place] and one in [place].  There is no one who can stay with her.  NCM informed her that she may have to go to SNF for short term rehab.  NCM informed CSW of this information.                Action/Plan: NCM will follow for transition of care needs.   Expected Discharge Date:                  Expected Discharge Plan:  Skilled Nursing Facility  In-House Referral:  Clinical Social Work  Discharge planning Services  CM Consult  Post Acute Care Choice:    Choice offered to:     DME Arranged:    DME Agency:     HH Arranged:    [place] Agency:     Status of Service:  In process, will continue to follow  If discussed at Long Length of Stay Meetings, dates discussed:    Additional Comments:  Zenon Mayo, RN 09/09/2018, 3:44 PM

## 2018-09-09 NOTE — Progress Notes (Signed)
Julie Williams for Infectious Disease  Date of Admission:  08/23/2018     Total days of antibiotics 22         ASSESSMENT/PLAN  Julie Williams is on Day 22 of antimicrobial therapy for rectal perforation requiring colon resection and diverting colostomy complicated by Fusobacterium/Enterococcus bacteremia, peritonitis and liver abscess s/p aspiration. Gram stain from aspiration without organisms and culture remains pending. Clinically she appears stable with no current evidence of infection. Recommend continued amoxicillin-clavulanate and will consider stopping treatment pending the aspiration cultures as we now have potential complete source control.  1. Continue current amoxicillin-clavulanate.  2. Monitor cultures, fever curve and WBC count.   Principal Problem:   Rectal perforation s/p partial colectomy an ddiverting colostomy Active Problems:   Long term (current) use of opiate analgesic   Acute respiratory failure with hypoxemia (HCC)   Stercoral ulcer of rectum   History of ETT   AKI (acute kidney injury) ([place])   Dysphagia   Benign essential HTN   Tachycardia   Acute blood loss anemia   Hepatic abscess   Bacteremia due to Enterococcus   Colostomy in place [facility])   . amLODipine  5 mg Oral Daily  . amoxicillin-clavulanate  500 mg Oral Q12H  . Chlorhexidine Gluconate Cloth  6 each Topical Daily  . feeding supplement  1 Container Oral TID BM  . guaiFENesin  30 mL Oral Q6H  . heparin  5,000 Units Subcutaneous Q8H  . labetalol  10 mg Intravenous Q4H  . mouth rinse  15 mL Mouth Rinse TID  . morphine  15 mg Oral TID  . QUEtiapine  25 mg Oral QHS  . sodium chloride flush  10-40 mL Intracatheter Q12H  . venlafaxine XR  37.5 mg Oral Q breakfast    SUBJECTIVE:  Afebrile overnight with stable WBC count of 14.7. Liver abscess drained with 180 ml of tan fluid sent for culture with gram stain showing no organisms and culture remains pending. Has had some confusion  overnight.  Feeling better today. Continues to have abdominal pain. Would like a large cup of ice water. Fluctuating between feeling hot and feeling cold.   Allergies  Allergen Reactions  . Bee Venom Swelling     Review of Systems: Review of Systems  Constitutional: Negative for chills, fever and malaise/fatigue.  Respiratory: Negative for cough, shortness of breath and wheezing.   Cardiovascular: Negative for chest pain and leg swelling.  Gastrointestinal: Positive for abdominal pain. Negative for constipation, diarrhea, nausea and vomiting.  Skin: Negative for rash.  Neurological: Negative for headaches.      OBJECTIVE: Vitals:   09/08/18 1900 09/09/18 0002 09/09/18 0509 09/09/18 0700  BP: (!) 144/92 (!) 151/96  (!) 183/104  Pulse: (!) 108 98  85  Resp:  20    Temp: 98.7 F (37.1 C) 99.5 F (37.5 C)  99 F (37.2 C)  TempSrc: Oral Oral  Oral  SpO2: 97% 95%  94%  Weight:   67.5 kg   Height:       Body mass index is 24.76 kg/m.  Physical Exam Constitutional:      General: She is not in acute distress.    Appearance: She is well-developed.  Cardiovascular:     Rate and Rhythm: Regular rhythm. Tachycardia present.     Heart sounds: Normal heart sounds. No murmur.  Pulmonary:     Effort: Pulmonary effort is normal.     Breath sounds: Normal breath sounds. No wheezing, rhonchi or  rales.  Abdominal:     General: There is no distension.     Tenderness: There is abdominal tenderness. There is no guarding.  Skin:    General: Skin is warm and dry.  Neurological:     Mental Status: She is alert.  Psychiatric:        Mood and Affect: Mood normal.     Lab Results Lab Results  Component Value Date   WBC 14.7 (H) 09/09/2018   HGB 8.6 (L) 09/09/2018   HCT 26.2 (L) 09/09/2018   MCV 97.8 09/09/2018   PLT 619 (H) 09/09/2018    Lab Results  Component Value Date   CREATININE 4.21 (H) 09/09/2018   BUN 66 (H) 09/09/2018   NA 150 (H) 09/09/2018   K 3.9 09/09/2018    CL 114 (H) 09/09/2018   CO2 21 (L) 09/09/2018    Lab Results  Component Value Date   ALT 13 08/31/2018   AST 21 08/31/2018   ALKPHOS 60 08/31/2018   BILITOT 0.7 08/31/2018     Microbiology: Recent Results (from the past 240 hour(s))  Culture, blood (routine x 2)     Status: None   Collection Time: 08/31/18  4:48 PM  Result Value Ref Range Status   Specimen Description BLOOD LEFT ANTECUBITAL  Final   Special Requests   Final    BOTTLES DRAWN AEROBIC ONLY Blood Culture adequate volume   Culture   Final    NO GROWTH 5 DAYS Performed at [facility], [address] [place], [place] [address]    Report Status 09/05/2018 FINAL  Final  Culture, blood (routine x 2)     Status: None   Collection Time: 08/31/18  4:55 PM  Result Value Ref Range Status   Specimen Description BLOOD THUMB  Final   Special Requests   Final    BOTTLES DRAWN AEROBIC ONLY Blood Culture adequate volume   Culture   Final    NO GROWTH 5 DAYS Performed at [facility], [address] [place], [place] [address]    Report Status 09/05/2018 FINAL  Final  Aerobic/Anaerobic Culture (surgical/deep wound)     Status: None (Preliminary result)   Collection Time: 09/08/18  4:00 PM  Result Value Ref Range Status   Specimen Description ABSCESS  Final   Special Requests RIGHT PERIHEPATIC  Final   Gram Stain   Final    ABUNDANT WBC PRESENT, PREDOMINANTLY PMN NO ORGANISMS SEEN Performed at [facility], [place] [address], [place],  [address]    Culture PENDING  Incomplete   Report Status PENDING  Incomplete     Terri Piedra, NP [place] [facility] [phone] Pager  09/09/2018  11:18 AM

## 2018-09-09 NOTE — Progress Notes (Signed)
  Speech Language Pathology Treatment: Dysphagia  Patient Details Name: Julie Williams MRN: [id number] DOB: [birth year] Today's Date: 09/09/2018 Time: 0810-0827 SLP Time Calculation (min) (ACUTE ONLY): 17 min  Assessment / Plan / Recommendation Clinical Impression  Skilled treatment session focused on dysphagia goals. SLP spoke with nursing who stated that pt continues to demonstrate fluctuating mentation. SLP received pt in bed. Pt able to comprehend SLP's instruction to move to head of bed and pt able to attempt to reposition herself for safe PO intake. Pt able to sustain alertness to consume of breakfast and able to communicate wants and needs regarding breakfast items with clarity. SLP further facilitiated session by providing skilled observation of pt consuming 8 oz nectar thick juice. Although pt consume with mild impulsivity, pt didn't demonstrate any overt s/s of aspiration. SLP also offered small amount of dysphagia 2 trial (graham cracker in pudding). Pt with overall effective mastication and good oral clearing. Pt also self-fed throughout as well as 4 oz applesauce. Would recommend continued trials of more advanced dysphagia 2 prior to upgrade d/t pt's fluctuating mentation.   Education on POC provided to pt's nurse.    HPI HPI: 53 year old female s/p rectum perforation followed by SBO, aspiration and ARDS whom was admitted to [facility] on 08/18/2018 requiring intubation on 08/22/2018 with transfer to [facility] on 08/23/2018.   PCCM was asked to accept on transfer for management of ARDS. She was extubated 09/04/2018.  Most recent chest xray is showing bilateral pulmonary infiltrates, bibasilar atelectasis which are similar findings noted on prior exam and stable cardiomegaly.      SLP Plan  Continue with current plan of care       Recommendations  Diet recommendations: Dysphagia 1 (puree);Nectar-thick liquid Liquids provided via: Cup Medication Administration: Crushed with  puree Supervision: Patient able to self feed Compensations: Slow rate;Small sips/bites;Minimize environmental distractions Postural Changes and/or Swallow Maneuvers: Seated upright 90 degrees                Oral Care Recommendations: Oral care QID Follow up Recommendations: (TBD) SLP Visit Diagnosis: Dysphagia, oropharyngeal phase (R13.12) Plan: Continue with current plan of care       GO                Donella Pascarella 09/09/2018, 11:53 AM

## 2018-09-09 NOTE — Progress Notes (Signed)
   Subjective: Julie Williams reports that she is very thirst this morning. She also has pain from the liver biopsy site.  Objective:  Vital signs in last 24 hours: Vitals:   09/08/18 1900 09/09/18 0002 09/09/18 0509 09/09/18 0700  BP: (!) 144/92 (!) 151/96  (!) 183/104  Pulse: (!) 108 98  85  Resp:  20    Temp: 98.7 F (37.1 C) 99.5 F (37.5 C)  99 F (37.2 C)  TempSrc: Oral Oral  Oral  SpO2: 97% 95%  94%  Weight:   67.5 kg   Height:       General: Lying in bed in no acute distress. CV: Tachycardia, normal rhythm Resp: Auscultated anteriorly, CTAB, no respiratory distress. Abd: Bandage over site of liver biopsy site (non-tender and no induration), ostomy in place with dark green content  Assessment/Plan:  Principal Problem:   Rectal perforation s/p partial colectomy an ddiverting colostomy Active Problems:   Long term (current) use of opiate analgesic   Acute respiratory failure with hypoxemia (HCC)   Stercoral ulcer of rectum   History of ETT   AKI (acute kidney injury) ([place])   Dysphagia   Benign essential HTN   Tachycardia   Acute blood loss anemia   Hepatic abscess   Bacteremia due to Enterococcus   Colostomy in place [facility])  Ms. Hones was admitted for colonic perforation status bowel resection with colostomy placement which has been complicated by bacteremia, acute renal failure, and ARDS.  Perforated Colon s/p bowel resection with colostomy placement: Appreciate surgery recommendations. surgical pathshowed inflammation, no malignancy or diverticulitis identified. - Continue wet to dry TID  Bacteremia with fusobacterium and enterococcus complicated by Hepatic abscess: Underwent ultrasound aspiration of the liver acidosis which yielded 180 mils of tan fluid.  Gram stain showed abundant white blood cells and no organisms.  Culture pending.  ID de-escalated her antibiotics from meropenem to Augmentin. - Continue Augmentin per ID - Follow-up liver aspiration  culture  Acute renal failure: Creatinine stable today at 4.21. She has not required hemodialysis. Appreciated nephrology recs. She has been treated with Lasix. Will hold Lasix in the setting of hypernatremia.  - Continue to monitor  Hypernatremia: Likely secondary to poor p.o. intake and insensible losses from colostomy. - Start R5J  Acute metabolic encephalopathy: Her mental status waxes and wanes.  It has improved since restarting her chronic opioids.  This morning she is alert and speaking coherently. - Morphine tablets 15 mg 3 times daily - IV morphine 1 to 2 mg every 4 hours as needed breakthrough pain - Seroquel 25 mg twice daily  Hypertension: Her blood pressures been been elevated with SBP in 140s-180s on IV labetolol. Will add amlodipine today. - Continue IV labetalol 10 mg every 4 hours - Start amlodipine 5 mg daily  Anemia: Likely secondary to acute blood loss from perforation.  She received 1 unit of packed red blood cells yesterday.  Posttransfusion CBC was 8 and this morning 8.6.  She also received Feraheme. -Continue to monitor CBCs  Acute respiratory distress with hypoxia: Resolved. She has normal oxygen saturations on room air.  We will continue to monitor.   Dispo: Anticipated discharge in approximately 5 to 7 days.  [name], MD 09/09/2018, 11:32 AM Pager: [phone]

## 2018-09-09 NOTE — Consult Note (Signed)
[place] Nurse wound follow up Wound type:surgical Measurement: 12cm x 6cm x 4cm  Wound bed:70% yellow loosely adhered stringy slough. 10% brown, 20% pink on wound edges. Drainage (amount, consistency, odor) dressing just changed, no drainage or odor noted.  Periwound: reddened with moisture, fungal rash noted. Dressing procedure/placement/frequency: Continue TID wet to dry dressings, add anti-fungal powder to skin surrounding wound. Surgical team has seen pt today and made photo of wound.  [place] Nurse ostomy follow up Stoma type/location: LLQ Stomal assessment/size: appliance not removed today. Peristomal assessment: na Treatment options for stomal/peristomal skin: na Output brown liquid effluent Ostomy pouching: 2 pc. 2 3/4"  Education provided: none, PT just finished with pt, MD rounds waiting. Pt crying currently. Will not educate today. Enrolled patient in [place] Start Discharge program: No  Supplies ordered for bedside RNs. Pouch currently intact without leaking. Wound care orders updated, surgery team assessed today. [place] team will follow weekly, please re-consult if assistance needed prior to next weekly visit.  Fara Olden, RN-C, WTA-C, [place] Wound Treatment Associate Ostomy Care Associate

## 2018-09-10 ENCOUNTER — Other Ambulatory Visit: Payer: Self-pay

## 2018-09-10 LAB — RENAL FUNCTION PANEL
Albumin: 1.5 g/dL — ABNORMAL LOW (ref 3.5–5.0)
Anion gap: 12 (ref 5–15)
BUN: 56 mg/dL — ABNORMAL HIGH (ref 6–20)
CALCIUM: 8.5 mg/dL — AB (ref 8.9–10.3)
CO2: 23 mmol/L (ref 22–32)
CREATININE: 3.82 mg/dL — AB (ref 0.44–1.00)
Chloride: 111 mmol/L (ref 98–111)
GFR calc Af Amer: 15 mL/min — ABNORMAL LOW (ref >60)
GFR calc non Af Amer: 13 mL/min — ABNORMAL LOW (ref >60)
Glucose, Bld: 106 mg/dL — ABNORMAL HIGH (ref 70–99)
Phosphorus: 7 mg/dL — ABNORMAL HIGH (ref 2.5–4.6)
Potassium: 4.1 mmol/L (ref 3.5–5.1)
SODIUM: 146 mmol/L — AB (ref 135–145)

## 2018-09-10 LAB — CBC WITH DIFFERENTIAL/PLATELET
Abs Immature Granulocytes: 0.23 10*3/uL — ABNORMAL HIGH (ref 0.00–0.07)
Basophils Absolute: 0.1 10*3/uL (ref 0.0–0.1)
Basophils Relative: 1 %
Eosinophils Absolute: 0.6 10*3/uL — ABNORMAL HIGH (ref 0.0–0.5)
Eosinophils Relative: 4 %
HEMATOCRIT: 25.6 % — AB (ref 36.0–46.0)
Hemoglobin: 8.4 g/dL — ABNORMAL LOW (ref 12.0–15.0)
Immature Granulocytes: 2 %
Lymphocytes Relative: 16 %
Lymphs Abs: 2.3 10*3/uL (ref 0.7–4.0)
MCH: 32.6 pg (ref 26.0–34.0)
MCHC: 32.8 g/dL (ref 30.0–36.0)
MCV: 99.2 fL (ref 80.0–100.0)
Monocytes Absolute: 1 10*3/uL (ref 0.1–1.0)
Monocytes Relative: 7 %
NEUTROS PCT: 70 %
Neutro Abs: 9.9 10*3/uL — ABNORMAL HIGH (ref 1.7–7.7)
Platelets: 571 10*3/uL — ABNORMAL HIGH (ref 150–400)
RBC: 2.58 MIL/uL — ABNORMAL LOW (ref 3.87–5.11)
RDW: 16.1 % — ABNORMAL HIGH (ref 11.5–15.5)
WBC: 14 10*3/uL — ABNORMAL HIGH (ref 4.0–10.5)
nRBC: 0 % (ref 0.0–0.2)

## 2018-09-10 LAB — URINALYSIS, ROUTINE W REFLEX MICROSCOPIC
Bilirubin Urine: NEGATIVE
GLUCOSE, UA: NEGATIVE mg/dL
Hgb urine dipstick: NEGATIVE
Ketones, ur: NEGATIVE mg/dL
LEUKOCYTES UA: NEGATIVE
NITRITE: NEGATIVE
PH: 6 (ref 5.0–8.0)
Protein, ur: NEGATIVE mg/dL
Specific Gravity, Urine: 1.008 (ref 1.005–1.030)

## 2018-09-10 MED ORDER — NITROGLYCERIN 0.4 MG SL SUBL
SUBLINGUAL_TABLET | SUBLINGUAL | Status: AC
  Administered 2018-09-10: 0.4 mg
  Filled 2018-09-10: qty 1

## 2018-09-10 MED ORDER — LABETALOL HCL 100 MG PO TABS
100.0000 mg | ORAL_TABLET | Freq: Two times a day (BID) | ORAL | Status: DC
  Administered 2018-09-10 – 2018-09-13 (×5): 100 mg via ORAL
  Filled 2018-09-10 (×6): qty 1

## 2018-09-10 MED ORDER — DEXTROSE 5 % IV SOLN
INTRAVENOUS | Status: AC
  Administered 2018-09-10: 09:00:00 via INTRAVENOUS

## 2018-09-10 NOTE — Progress Notes (Signed)
[facility] Progress Note     Subjective: CC: abdominal pain Patent reports constant sharp abdominal pain that is unchanged. Denies nausea. Chugging milk when I walked in room. Confusion seems better this AM, able to tell me that she is in a hospital and that she is here because she had a perforated colon. Patient still complaining of dysuria.  Objective: Vital signs in last 24 hours: Temp:  [98.5 F (36.9 C)-99.7 F (37.6 C)] 99.7 F (37.6 C) (01/16 0756) Pulse Rate:  [87-91] 91 (01/16 0756) Resp:  [18] 18 (01/15 2347) BP: (151-161)/(89-99) 161/97 (01/16 0756) SpO2:  [94 %-95 %] 94 % (01/16 0756) Last BM Date: 09/09/18  Intake/Output from previous day: 01/15 0701 - 01/16 0700 In: -  Out: 2400 [Urine:2300; Stool:100] Intake/Output this shift: No intake/output data recorded.  PE: Gen: Alert, NAD, pleasant Card: Regular rate and rhythm Pulm: Normal effort WUX:LKGM,[id number],VQQV generalized TTP, ostomy pink with brown stool in bag; midline wound with slough/necrosis at base which is improving with visible sutures     Lab Results:  Recent Labs    09/09/18 0643 09/10/18 0551  WBC 14.7* 14.0*  HGB 8.6* 8.4*  HCT 26.2* 25.6*  PLT 619* 571*   BMET Recent Labs    09/09/18 0644 09/10/18 0552  NA 150* 146*  K 3.9 4.1  CL 114* 111  CO2 21* 23  GLUCOSE 105* 106*  BUN 66* 56*  CREATININE 4.21* 3.82*  CALCIUM 8.5* 8.5*   PT/INR No results for input(s): LABPROT, INR in the last 72 hours. CMP     Component Value Date/Time   NA 146 (H) 09/10/2018 0552   K 4.1 09/10/2018 0552   CL 111 09/10/2018 0552   CO2 23 09/10/2018 0552   GLUCOSE 106 (H) 09/10/2018 0552   BUN 56 (H) 09/10/2018 0552   CREATININE 3.82 (H) 09/10/2018 0552   CREATININE 0.72 04/28/2014 1201   CALCIUM 8.5 (L) 09/10/2018 0552   PROT 5.9 (L) 08/31/2018 0500   ALBUMIN 1.5 (L) 09/10/2018 0552   AST 21 08/31/2018 0500   ALT 13 08/31/2018 0500   ALKPHOS 60 08/31/2018 0500    BILITOT 0.7 08/31/2018 0500   GFRNONAA 13 (L) 09/10/2018 0552   GFRNONAA >89 04/28/2014 1201   GFRAA 15 (L) 09/10/2018 0552   GFRAA >89 04/28/2014 1201   Lipase     Component Value Date/Time   LIPASE 87 (H) 08/23/2018 1834       Studies/Results: [place] Image Guided Drainage By Percutaneous Catheter  Result Date: 09/08/2018 INDICATION: History of rectal perforation, ARDS. Perihepatic collections suspected on CT and ultrasound studies. Aspiration requested. EXAM: ULTRASOUND GUIDED ASPIRATION OF PERIHEPATIC COLLECTION MEDICATIONS: lidocaine 1% subcutaneous ANESTHESIA/SEDATION: Intravenous Fentanyl and Versed were administered as conscious sedation during continuous monitoring of the patient's level of consciousness and physiological / cardiorespiratory status by the radiology RN, with a total moderate sedation time of 20 minutes. PROCEDURE: The procedure, risks, benefits, and alternatives were explained to the patient. Questions regarding the procedure were encouraged and answered. The patient understands and consents to the procedure. Survey ultrasound of the right upper quadrant was performed. Perihepatic collection was localized and an appropriate skin entry site was determined. The operative field was prepped with chlorhexidine in a sterile fashion, and a sterile drape was applied covering the operative field. A sterile gown and sterile gloves were used for the procedure. Local anesthesia was provided with 1% Lidocaine. Under real-time ultrasound guidance, an 18 gauge trocar needle was advanced into the collection. Approximately  10 mL of beige opaque fluid were aspirated, but on ultrasound a significant amount of the collection persisted. Therefore, the trocar needle was removed and a 10 cm multi sidehole Yueh sheath needle was advanced under ultrasound guidance into the collection. This facilitated aspiration of another 170 mL of opaque fluid. Because of the potential for pleural traversal, the  tract was not dilated for drain placement. Small residual of complex material was identified on the final image after removal of the Yueh sheath. No evidence of hemorrhage. The patient tolerated the procedure well. COMPLICATIONS: None immediate. FINDINGS: Perihepatic collection was localized. 180 mL beige opaque fluid aspirated as detailed above, and a sample sent for Gram stain and culture. IMPRESSION: 1. Technically successful ultrasound-guided aspiration of perihepatic abscess, removing 180 mL, sample sent for Gram stain and culture. Electronically Signed   By: Lucrezia Europe M.D.   On: 09/08/2018 16:49    Anti-infectives: Anti-infectives (From admission, onward)   Start     Dose/Rate Route Frequency Ordered Stop   09/08/18 1030  amoxicillin-clavulanate (AUGMENTIN) 500-125 MG per tablet 500 mg     500 mg Oral Every 12 hours 09/08/18 1020     09/04/18 1121  meropenem (MERREM) 1 g in sodium chloride 0.9 % 100 mL IVPB  Status:  Discontinued     1 g 200 mL/hr over 30 Minutes Intravenous Every 24 hours 09/03/18 1709 09/08/18 1020   08/30/18 1030  linezolid (ZYVOX) IVPB 600 mg  Status:  Discontinued     600 mg 300 mL/hr over 60 Minutes Intravenous Every 12 hours 08/30/18 0941 09/04/18 1437   08/28/18 2000  DAPTOmycin (CUBICIN) 640 mg in sodium chloride 0.9 % IVPB  Status:  Discontinued     640 mg 225.6 mL/hr over 30 Minutes Intravenous Every 48 hours 08/28/18 1330 08/30/18 0941   08/28/18 1400  meropenem (MERREM) 1 g in sodium chloride 0.9 % 100 mL IVPB  Status:  Discontinued     1 g 200 mL/hr over 30 Minutes Intravenous Every 12 hours 08/28/18 1330 09/03/18 1709   08/26/18 1030  DAPTOmycin (CUBICIN) 618.5 mg in sodium chloride 0.9 % IVPB  Status:  Discontinued     8 mg/kg  77.3 kg 224.7 mL/hr over 30 Minutes Intravenous Every 48 hours 08/26/18 0934 08/26/18 0935   08/26/18 1030  DAPTOmycin (CUBICIN) 620 mg in sodium chloride 0.9 % IVPB  Status:  Discontinued     620 mg 224.8 mL/hr over 30 Minutes  Intravenous Every 48 hours 08/26/18 0935 08/27/18 1038   08/25/18 0300  vancomycin (VANCOCIN) IVPB 1000 mg/200 mL premix  Status:  Discontinued     1,000 mg 200 mL/hr over 60 Minutes Intravenous  Once 08/24/18 0122 08/24/18 1049   08/24/18 0100  vancomycin (VANCOCIN) IVPB 1000 mg/200 mL premix  Status:  Discontinued     1,000 mg 200 mL/hr over 60 Minutes Intravenous Every 12 hours 08/24/18 0045 08/24/18 0122   08/24/18 0100  piperacillin-tazobactam (ZOSYN) IVPB 2.25 g  Status:  Discontinued     2.25 g 100 mL/hr over 30 Minutes Intravenous Every 6 hours 08/24/18 0045 08/28/18 1304       Assessment/Plan Enterococcal bacteremia,fusobacterium and enterococcus/aspiration pneumonia-ID following HTN GERD Depression Alcohol abuse Chronic pain/narcotic dependence -On Morphine at home Former smoker VDRF/ARDS- aspiration pneumonia, resp cx grew few candida albicans Acute renal failure-creatinine3.82today,trending down  Anemia-h/h8.4/25.6today, s/p1 unit PRBCs1/6 and 1/14 Hypokalemia - resolved  Perforated colon/rectum S/p exploratory laparotomy, resection distal sigmoid colon and proximal rectum, diverting colostomy, wound vac  placement 12/25 Dr. Noberto Retort - POD 22 -surgical pathshowed inflammation, no malignancy or diverticulitis identified -wound vac changed to wet to dryTID1/3/20 -JP drain removed 1/12 -CT scan 1/8 worsening pneumonia, no definite intraabdominal abscess or SBO; cannot rule out liver abscess Liver Abscess- IR aspiration of liver abscess 1/14, cxs with no growth to date Dysuria - check UA, negative UA on 1/1  ID -vancomycin 12/30>>12/30;zosyn 12/30>>08/28/18,daptomycin 1/1>>1/5, linezolid 1/5>>1/10;Meropenem 1/3>1/14; PO augmentin 1/14>> VTE -SCDs, sq heparin FEN -IVF,dysphagia 1 diet Foley -none Follow up - Dr. Noberto Retort  Plan:ContinueTIDwet to dry dressingchanges. Check UA. CSW working on SNF placement, patient fine to go to SNF  whenever bed available from surgical standpoint. Will need to follow up with original surgeon from [facility].   LOS: 18 days    Brigid Re , [facility] 09/10/2018, 8:33 AM Pager: [phone] Consults: [phone] Mon-Fri 7:00 am-4:30 pm Sat-Sun 7:00 am-11:30 am

## 2018-09-10 NOTE — Progress Notes (Signed)
    [facility] for Infectious Disease   Reason for visit: Follow up on liver abscess  Interval History: aspiration yesterday and ngtd. Afebrile, WBC 14; complaint of pain in fingers since procedure yesterday.     Physical Exam: Constitutional:  Vitals:   09/09/18 2347 09/10/18 0756  BP: (!) 151/89 (!) 161/97  Pulse: 87 91  Resp: 18   Temp: 98.5 F (36.9 C) 99.7 F (37.6 C)  SpO2: 95% 94%   patient appears in NAD Eyes: anicteric Respiratory: Normal respiratory effort; CTA B Cardiovascular: RRR GI: soft, some tenderness with light palpation, no guarding  Review of Systems: Constitutional: negative for fevers and chills Integument/breast: negative for rash  Lab Results  Component Value Date   WBC 14.0 (H) 09/10/2018   HGB 8.4 (L) 09/10/2018   HCT 25.6 (L) 09/10/2018   MCV 99.2 09/10/2018   PLT 571 (H) 09/10/2018    Lab Results  Component Value Date   CREATININE 3.82 (H) 09/10/2018   BUN 56 (H) 09/10/2018   NA 146 (H) 09/10/2018   K 4.1 09/10/2018   CL 111 09/10/2018   CO2 23 09/10/2018    Lab Results  Component Value Date   ALT 13 08/31/2018   AST 21 08/31/2018   ALKPHOS 60 08/31/2018     Microbiology: Recent Results (from the past 240 hour(s))  Culture, blood (routine x 2)     Status: None   Collection Time: 08/31/18  4:48 PM  Result Value Ref Range Status   Specimen Description BLOOD LEFT ANTECUBITAL  Final   Special Requests   Final    BOTTLES DRAWN AEROBIC ONLY Blood Culture adequate volume   Culture   Final    NO GROWTH 5 DAYS Performed at [facility], [address] [place] [address]    Report Status 09/05/2018 FINAL  Final  Culture, blood (routine x 2)     Status: None   Collection Time: 08/31/18  4:55 PM  Result Value Ref Range Status   Specimen Description BLOOD THUMB  Final   Special Requests   Final    BOTTLES DRAWN AEROBIC ONLY Blood Culture adequate volume   Culture   Final    NO GROWTH 5 DAYS Performed at [facility], [address], [place], [place] [address]    Report Status 09/05/2018 FINAL  Final  Aerobic/Anaerobic Culture (surgical/deep wound)     Status: None (Preliminary result)   Collection Time: 09/08/18  4:00 PM  Result Value Ref Range Status   Specimen Description ABSCESS  Final   Special Requests RIGHT PERIHEPATIC  Final   Gram Stain   Final    ABUNDANT WBC PRESENT, PREDOMINANTLY PMN NO ORGANISMS SEEN    Culture   Final    CULTURE REINCUBATED FOR BETTER GROWTH HOLDING FOR POSSIBLE ANAEROBE Performed at [facility], [place] [address], [place], [place] [address]    Report Status PENDING  Incomplete    Impression/Plan:  1. Liver abscess - culture pending.  On augmentin and will continue.  Awaiting any culture growth.    2.  Bacteremia - cultures previously with fusobacterium and Enterococcus.  On treatment as above, narrowed from previous meropenem.   Will continue.   3.  Acute renal failure - creat with some improvement.  No dose adjustments needed for Augmentin.

## 2018-09-10 NOTE — Progress Notes (Signed)
Subjective: Interval History: has no complaint this am.  Objective: Vital signs in last 24 hours: Temp:  [98.5 F (36.9 C)-99 F (37.2 C)] 98.5 F (36.9 C) (01/15 2347) Pulse Rate:  [85-90] 87 (01/15 2347) Resp:  [18] 18 (01/15 2347) BP: (151-183)/(89-104) 151/89 (01/15 2347) SpO2:  [94 %-95 %] 95 % (01/15 2347) Weight change:   Intake/Output from previous day: 01/15 0701 - 01/16 0700 In: -  Out: 2400 [Urine:2300; Stool:100] Intake/Output this shift: Total I/O In: -  Out: 2400 [Urine:2300; Stool:100]  General appearance: cooperative, no distress and pale Resp: clear to auscultation bilaterally Cardio: S1, S2 normal and systolic murmur: systolic ejection 2/6, crescendo and decrescendo at 2nd left intercostal space GI: lower midline dressing , ostomy LMid abdm Extremities: edema 1+  Lab Results: Recent Labs    09/09/18 0643 09/10/18 0551  WBC 14.7* 14.0*  HGB 8.6* 8.4*  HCT 26.2* 25.6*  PLT 619* 571*   BMET:  Recent Labs    09/09/18 0644 09/10/18 0552  NA 150* 146*  K 3.9 4.1  CL 114* 111  CO2 21* 23  GLUCOSE 105* 106*  BUN 66* 56*  CREATININE 4.21* 3.82*  CALCIUM 8.5* 8.5*   No results for input(s): PTH in the last 72 hours. [place] Studies:  Recent Labs    09/07/18 0730  IRON 16*  TIBC 134*  FERRITIN 1,439*    Studies/Results: [place] Image Guided Drainage By Percutaneous Catheter  Result Date: 09/08/2018 INDICATION: History of rectal perforation, ARDS. Perihepatic collections suspected on CT and ultrasound studies. Aspiration requested. EXAM: ULTRASOUND GUIDED ASPIRATION OF PERIHEPATIC COLLECTION MEDICATIONS: lidocaine 1% subcutaneous ANESTHESIA/SEDATION: Intravenous Fentanyl and Versed were administered as conscious sedation during continuous monitoring of the patient's level of consciousness and physiological / cardiorespiratory status by the radiology RN, with a total moderate sedation time of 20 minutes. PROCEDURE: The procedure, risks, benefits, and  alternatives were explained to the patient. Questions regarding the procedure were encouraged and answered. The patient understands and consents to the procedure. Survey ultrasound of the right upper quadrant was performed. Perihepatic collection was localized and an appropriate skin entry site was determined. The operative field was prepped with chlorhexidine in a sterile fashion, and a sterile drape was applied covering the operative field. A sterile gown and sterile gloves were used for the procedure. Local anesthesia was provided with 1% Lidocaine. Under real-time ultrasound guidance, an 18 gauge trocar needle was advanced into the collection. Approximately 10 mL of beige opaque fluid were aspirated, but on ultrasound a significant amount of the collection persisted. Therefore, the trocar needle was removed and a 10 cm multi sidehole Yueh sheath needle was advanced under ultrasound guidance into the collection. This facilitated aspiration of another 170 mL of opaque fluid. Because of the potential for pleural traversal, the tract was not dilated for drain placement. Small residual of complex material was identified on the final image after removal of the Yueh sheath. No evidence of hemorrhage. The patient tolerated the procedure well. COMPLICATIONS: None immediate. FINDINGS: Perihepatic collection was localized. 180 mL beige opaque fluid aspirated as detailed above, and a sample sent for Gram stain and culture. IMPRESSION: 1. Technically successful ultrasound-guided aspiration of perihepatic abscess, removing 180 mL, sample sent for Gram stain and culture. Electronically Signed   By: Lucrezia Europe M.D.   On: 09/08/2018 16:49    I have reviewed the patient's current medications.  Assessment/Plan: 1 AKI improving. Mild vol xs but diuresing on her own.  Cannot respond normally to [facility]  to keep S Na normal (osm)but overall improving. Also suspect free water loss with ostomy 2 Perf colon with sepsis 3 Sepsis on po  AB 4 Narcotic addiction 5 Confusion seems a little better this am P push pos, allow to diuese on her own.  Will follow 1-2 more days if stable    LOS: 18 days   Jeneen Rinks Tinamarie Przybylski 09/10/2018,6:53 AM

## 2018-09-10 NOTE — Progress Notes (Signed)
NCM contacted [facility] adv to see if ltach would be an option, awaiting call back.

## 2018-09-10 NOTE — Progress Notes (Addendum)
Paged regarding new chest pain that responded to SL nitro. Patient was evaluated at bedside and appeared very comfortable. She denied any further chest pain after receiving SL nitro and morphine. She stated the chest pain started abruptly, lasted one hour, located in the epigastric region, described as sharp, non-radiating. She denies associated n/v, diaphoresis, arm/jaw pain/numbness. She has a history of "heart problems" and has nitroglycerin tabs at home but has not had to take them in years. Nursing also got an EKG which showed sinus tachycardia without ST changes or T wave inversions.   HR 105, BP 142/92, O2 94% RA Gen: resting in bed, comfortable appearing Cardiac: tachycardic, regular rhythm, TTP over lower midline chest wall/epigastric region  A/P: 53yo female admitted for colonic perforation status bowel resection with colostomy placement which has been complicated by bacteremia (currently treating), acute renal failure (improving), and ARDS (resolved). She complains of new onset chest pain with some concerning features and relieved by nitroglycerin. EKG reassuring. Chest pain has now resolved. Most likely GERD (patient was belching several times while I was in the room) but also have to consider ACS.  - troponin now, then q6h - prn tums  Francesco Runner, MD Internal Medicine, PGY1 Pager: [phone] 09/10/2018, 11:57 PM

## 2018-09-10 NOTE — Discharge Summary (Addendum)
Name: Julie Williams MRN: [id number] DOB: 12-01-1965 53 y.o. PCP: Valinda Party, DO  Date of Admission: 08/23/2018  4:23 PM Date of Discharge: 09/12/2018 Attending Physician: Lucious Groves, DO  Discharge Diagnosis: 1. Small bowel obstruction with Perforated Colon s/p bowel resection with colostomy placemen 2. Bacteremia with fusobacterium and enterococcus complicated byHepatic abscess 3. Aspiration pneumonia requiring ventilation/ARDS 4. Acute Metabolic Encephalopathy 5. Acute Renal Failure 6. Hypernatremia 7. Acute blood loss anemia 8. Long-term opiate use 9. Hypertension  Patient Active Problem List   Diagnosis Date Noted  . Bacteremia due to Enterococcus 09/08/2018  . Colostomy in place [facility]) 09/08/2018  . Dysphagia   . Benign essential HTN   . Tachycardia   . Hypernatremia   . Leukocytosis   . Acute blood loss anemia   . Hepatic abscess   . AKI (acute kidney injury) ([place])   . History of ETT   . Stercoral ulcer of rectum 08/27/2018  . Rectal perforation s/p partial colectomy an ddiverting colostomy 08/27/2018  . Acute respiratory failure with hypoxemia ([place])   . Elevated blood pressure reading 08/16/2018  . Wheezing 04/08/2018  . Chest pain 12/14/2017  . Osteoarthritis of right acromioclavicular joint 11/10/2017  . Agitation 09/07/2017  . Vaginal atrophy 07/05/2016  . Throat discomfort 05/30/2016  . Preop cardiovascular exam 05/20/2016  . Acromioclavicular joint arthritis 12/25/2015  . Essential hypertension 07/10/2015  . Cervical spine degeneration 06/19/2015  . S/P complete repair of rotator cuff 04/13/2015  . Complete tear of right rotator cuff 01/06/2015  . S/P cervical spinal fusion 01/06/2015  . GERD (gastroesophageal reflux disease) 11/07/2014  . Hoarseness 04/08/2013  . Hearing loss 03/04/2013  . Tonsil pain 03/04/2013  . History of colonic polyps 09/13/2011  . Hypertension with goal to be determined 09/13/2011  . Tobacco use  disorder 07/10/2011  . Long term (current) use of opiate analgesic 06/04/2011  . Chronic neck pain 06/04/2011  . Pain in right shoulder 06/04/2011  . Depression 07/17/2009  . Rotator cuff syndrome of right shoulder 06/29/2009    Discharge Medications: Allergies as of 09/11/2018      Reactions   Bee Venom Swelling      Medication List    STOP taking these medications   aspirin EC 81 MG tablet   EPINEPHrine 0.3 mg/0.3 mL Soaj injection Commonly known as:  EPI-PEN   ibuprofen 200 MG tablet Commonly known as:  ADVIL,MOTRIN   naproxen sodium 220 MG tablet Commonly known as:  ALEVE   varenicline 0.5 MG tablet Commonly known as:  CHANTIX   varenicline 1 MG tablet Commonly known as:  CHANTIX     TAKE these medications   acetaminophen 160 MG/5ML solution Commonly known as:  TYLENOL Place 20.3 mLs (650 mg total) into feeding tube every 6 (six) hours as needed for fever.   albuterol 108 (90 Base) MCG/ACT inhaler Commonly known as:  PROVENTIL HFA;VENTOLIN HFA Inhale 1-2 puffs into the lungs every 6 (six) hours as needed for wheezing or shortness of breath.   amLODipine 5 MG tablet Commonly known as:  NORVASC Take 1 tablet (5 mg total) by mouth daily. Start taking on:  September 12, 2018   amoxicillin-clavulanate 500-125 MG tablet Commonly known as:  AUGMENTIN Take 1 tablet (500 mg total) by mouth every 12 (twelve) hours for 10 days.   calcium carbonate 500 MG chewable tablet Commonly known as:  TUMS - dosed in mg elemental calcium Chew 2 tablets (400 mg of elemental calcium total) by mouth  2 (two) times daily as needed for indigestion or heartburn.   Chlorhexidine Gluconate Cloth 2 % Pads Apply 6 each topically daily. Start taking on:  September 12, 2018   diphenhydrAMINE 25 MG tablet Commonly known as:  BENADRYL Take 25 mg by mouth every 6 (six) hours as needed for itching, allergies or sleep.   guaiFENesin 100 MG/5ML Soln Commonly known as:  ROBITUSSIN Take 30 mLs  (600 mg total) by mouth every 6 (six) hours.   heparin 5000 UNIT/ML injection Inject 1 mL (5,000 Units total) into the skin every 8 (eight) hours.   labetalol 100 MG tablet Commonly known as:  NORMODYNE Take 1 tablet (100 mg total) by mouth 2 (two) times daily.   morphine 15 MG tablet Commonly known as:  MSIR Take 1 tablet (15 mg total) by mouth 3 (three) times daily for 7 days. What changed:    medication strength  how much to take  how to take this  when to take this  additional instructions   mouth rinse Liqd solution 15 mLs by Mouth Rinse route 3 (three) times daily.   omeprazole 40 MG capsule Commonly known as:  PRILOSEC Take 1 capsule (40 mg total) by mouth daily.   ondansetron 4 MG/2ML Soln injection Commonly known as:  ZOFRAN Inject 2 mLs (4 mg total) into the vein every 6 (six) hours as needed for nausea or vomiting.   polyethylene glycol powder powder Commonly known as:  GLYCOLAX/MIRALAX DISSOLVE 255 GRAMS INTO LIQUID AND TAKE BY MOUTH ONCE.   QUEtiapine 25 MG tablet Commonly known as:  SEROQUEL Take 1 tablet (25 mg total) by mouth at bedtime.   [place] Take 1 Container by mouth as needed.   venlafaxine XR 37.5 MG 24 hr capsule Commonly known as:  EFFEXOR-XR Take 1 capsule (37.5 mg total) by mouth daily with breakfast.       Disposition and follow-up:   Ms.Suhana L XXXJones was discharged from [facility] in Stable condition.  At the hospital follow up visit please address:  1.  Please assess wound and ensure she received wet to dry dressings BID. Please ensure she follows up with surgery on 1/28. Please ensure patient completes Augmentin x 9 days. Will need liver US or CT abdomen 1 week after completion of antibiotics to assess for recurrence of abscess. Please ensure patient has follow up at [facility] after discharge from SNF.   2.  Labs / imaging needed at time of follow-up: Liver Ultrasound, renal function  panel, and CBC  3.  Pending labs/ test needing follow-up: Hepatic abscess culture  Follow-up Appointments: Follow-up Information    Lininger, Juanda Bond., MD Follow up on 09/22/2018.   Specialty:  Surgery Why:  Follow-up appointment on January 28th at 10:15 AM.  Contact information: [place] [address] [phone]           Hospital Course by problem list:  1. Small bowel obstruction wth Perforated Colon s/p bowel resection with colostomy placement: Ms. Portier was admitted to [facility] on 12/24 with colonic perforation requiring colectomy and colostomy in the setting of opioid-induced constipation. She developed fusobacterium/enteroccoccus bacteremia which was treated with antibiotics. She also developed a hepatic abscess which was drained (culture pending). She will need to continue Augmentin for an additional 9 days and have a liver ultrasound or CT abdomen in 1 week to evaluate for resolution. If completely resolved, please stop Augmentin. She has an appointment with the general  surgeon who did the original bowel resection and colostomy placement in 2 weeks. Please ensure that she makes this appointment.   2. Bacteremia with fusobacterium and enterococcus complicated byHepatic abscess: She developed bacteremia and was treated with antibiotics. Repeat blood cultures negative. She also developed a hepatic abscess which was drained. Please follow antibiotic regiment per above. She will need a repeat liver ultrasound in 1 week.   3. Aspiration pneumonia requiring ventilation/ARDS: She was admitted to the ICU where she was intubated and treated with antibiotics. She subsequently improved and was extubated on 1/10. She did well on room air the remainder of her admission.   4. Acute Metabolic Encephalopathy: She developed acute metabolic encephalopathy which improved with restarting her chronic opioids. Prior to discharge, she was mentating well.   5. Acute Renal  Failure: She developed kidney injury likely due to acute tubular necrosis in the setting of Vanc/Zosyn use and shock. She subsequently was fluid overload and was treated with Lasix with improvement in her creatinine. Renal function was stable on discharge, though not improved. She will need close monitoring of her renal function as well as electrolytes. If acidemia worsens, nephrology recommends starting sodium bicarbonate.   6. Hypernatremia: Likely due to insensible losses from colostomy and open midline abdomen wound. Sodium improved to a normal range with D5W IVFs.  7.  Anemia: Likely secondary to acute blood loss from perforation as well as acute renal failure.  She received 2 unit of packed red blood cells and Feraheme during her admission. Discharge Hb 7 (prior to unit of blood on day of discharge).  Please recheck CBC periodically and transfuse >7.   8. Long-term opiate use: She has a long history of using opioids. Please ensure that she receives morphine 15 mg IR TID for her back. Recommend avoiding escalation of opiate regimen.   9. Hypertension: She had elevated blood pressures throughout her admission which were treated with anti-hypertensive's. Her discharge BP meds include labetalol 100 mg BID and amlodipine 5 mg daily. Please ensure that she takes these medications.    Discharge Vitals:   BP 117/81   Pulse 96   Temp 99.7 F (37.6 C)   Resp 18   Ht 5\' 5"  (1.651 m)   Wt 67.5 kg   SpO2 94%   BMI 24.76 kg/m   Pertinent Labs, Studies, and Procedures:  CBC Latest Ref Rng & Units 09/11/2018 09/10/2018 09/09/2018  WBC 4.0 - 10.5 K/uL 15.5(H) 14.0(H) 14.7(H)  Hemoglobin 12.0 - 15.0 g/dL 7.5(L) 8.4(L) 8.6(L)  Hematocrit 36.0 - 46.0 % 24.2(L) 25.6(L) 26.2(L)  Platelets 150 - 400 K/uL 490(H) 571(H) 619(H)   BMP Latest Ref Rng & Units 09/11/2018 09/10/2018 09/09/2018  Glucose 70 - 99 mg/dL 92 106(H) 105(H)  BUN 6 - 20 mg/dL 57(H) 56(H) 66(H)  Creatinine 0.44 - 1.00 mg/dL 4.23(H) 3.82(H)  4.21(H)  Sodium 135 - 145 mmol/L 143 146(H) 150(H)  Potassium 3.5 - 5.1 mmol/L 4.4 4.1 3.9  Chloride 98 - 111 mmol/L 109 111 114(H)  CO2 22 - 32 mmol/L 21(L) 23 21(L)  Calcium 8.9 - 10.3 mg/dL 8.3(L) 8.5(L) 8.5(L)    1/9 abdominal US: Decompressed gallbladder with probably artifactual thickening of the gallbladder wall measuring up to 5.5 mm. Pericholecystic fluid may be secondary to presence of ascites. Mild distension of the common bile duct measuring up to 6.3 mm. Proximal common bile duct obstruction can not be entirely excluded. Nodular liver contour with diffusely heterogeneous echogenicity, likely due to chronic liver disease. Large  hypoechoic mass in the inferior right lobe of the liver. This may represent a liver abscess or subcapsular hematoma versus a true hepatic mass. Evaluation with MRI of the abdomen with contrast, when clinically feasible may be considered.  1/8 CT Abdomen: 1. Worsening airspace consolidation and ground-glass in the lung bases, most indicative of pneumonia. Associated small bilateral pleural effusions. 2. Hepatic steatosis. Possible area of new low-attenuation in the posterior right hepatic lobe. Assessment is difficult without IV contrast. Abscess is not excluded. Sonographic evaluation to assess for hyperemia may be useful, as clinically indicated. 3. Moderate ascites, increased from 08/26/2018, with peritoneal dialysis catheter in place.  Signed: [name], MD 09/10/2018, 3:03 PM   Pager: [phone]

## 2018-09-10 NOTE — Progress Notes (Signed)
   Subjective: Julie Williams states that she is having a 9 out of 10 abdominal pain despite treatment with p.o. morphine.  She also has very dry skin of her hands.  She denies any current hallucinations and states that she has not "seeing snakes in the room in 2 days".  She is very thirsty this morning and is requesting a Coke.  Objective:  Vital signs in last 24 hours: Vitals:   09/09/18 0509 09/09/18 0700 09/09/18 1731 09/09/18 2347  BP:  (!) 183/104 (!) 159/99 (!) 151/89  Pulse:  85 90 87  Resp:    18  Temp:  99 F (37.2 C) 98.6 F (37 C) 98.5 F (36.9 C)  TempSrc:  Oral  Oral  SpO2:  94% 94% 95%  Weight: 67.5 kg     Height:       General: Lying in bed in no acute distress Cardiovascular: Normal rate, regular rhythm Respiratory: Auscultated anteriorly and clear to auscultation bilaterally. Abdomen: Bandage over large surgical wound.  Mild tenderness to palpation.  Assessment/Plan:  Principal Problem:   Rectal perforation s/p partial colectomy an ddiverting colostomy Active Problems:   Long term (current) use of opiate analgesic   Acute respiratory failure with hypoxemia ([facility])   Stercoral ulcer of rectum   History of ETT   AKI (acute kidney injury) ([place])   Dysphagia   Benign essential HTN   Tachycardia   Acute blood loss anemia   Hepatic abscess   Bacteremia due to Enterococcus   Colostomy in place [facility])  Julie Williams was admitted for colonic perforation status bowel resection with colostomy placement which has been complicated by bacteremia (currently treating), acute renal failure (improving), and ARDS (resolved).  Perforated Colon s/p bowel resection with colostomy placement: Appreciate surgery recommendations which include that she is surgically stable to go to SNF when bed available. She will need to follow-up with original surgeon from [facility]. - Continue wet to dry TID  Bacteremia with fusobacterium and enterococcus complicated by Hepatic abscess: Culture  from US aspiration of liver abscess NGTD. Will continue Augmentin for now per ID. Blood cultures NGTD <24 hours. Recommend repeat liver US in 1 week. - Continue Augmentin per ID - Repeat liver ultrasound in 1 week  Acute renal failure: Creatinine improved today 3.82 after IVFs. Will continue holding Lasix for now and plan for additional IVFs today. - Continue to monitor  Hypernatremia: Likely due to insensible losses from colostomy. Na improved today (150->146) with IVFs.  - Continue D5W today  Acute metabolic encephalopathy: Continues to wax and wane. Has improved since restarting chronic opioids.  - Morphine tablets 15 mg 3 times daily - Discontinued IV morphine as she is not getting this medication. - Seroquel 25 mg twice daily  Hypertension: Last nights BP improved to 010'U-725'D systolic on IV labetalol and amlodipine. Will continue current regiment as amlodipine can take 1-2 days to take full effect. - Continue IV labetalol 10 mg every 4 hours - Continue amlodipine 5 mg daily  Anemia: s/p 1 uPRBC and ferraheme. Hb stable today at 8.4.  -Continue to monitor  Dispo: Anticipated discharge in approximately 2 to 3 days.   [name], MD 09/10/2018, 6:51 AM Pager: [phone]

## 2018-09-10 NOTE — Progress Notes (Signed)
CSW met with patient to discuss bed offers. Patient was asleep upon arrival, appeared very tired during discussion. CSW discussed bed offers with the patient, but patient did not respond. CSW asked if patient wanted to stay close to her home, and patient nodded. CSW attempted to ask patient questions, and she did not respond. CSW found the list that was provided to patient yesterday and highlighted the facilities that had offered a bed for the patient, and asked the patient to look them over and think about her options. Patient nodded.   CSW to follow.  Laveda Abbe, [place] Clinical Social Worker [phone]

## 2018-09-11 ENCOUNTER — Other Ambulatory Visit: Payer: Self-pay | Admitting: Internal Medicine

## 2018-09-11 DIAGNOSIS — K219 Gastro-esophageal reflux disease without esophagitis: Secondary | ICD-10-CM

## 2018-09-11 LAB — CBC WITH DIFFERENTIAL/PLATELET
Abs Immature Granulocytes: 0.22 10*3/uL — ABNORMAL HIGH (ref 0.00–0.07)
Basophils Absolute: 0.1 10*3/uL (ref 0.0–0.1)
Basophils Relative: 1 %
Eosinophils Absolute: 0.5 10*3/uL (ref 0.0–0.5)
Eosinophils Relative: 3 %
HCT: 24.2 % — ABNORMAL LOW (ref 36.0–46.0)
Hemoglobin: 7.5 g/dL — ABNORMAL LOW (ref 12.0–15.0)
Immature Granulocytes: 1 %
Lymphocytes Relative: 16 %
Lymphs Abs: 2.5 10*3/uL (ref 0.7–4.0)
MCH: 32.3 pg (ref 26.0–34.0)
MCHC: 31 g/dL (ref 30.0–36.0)
MCV: 104.3 fL — AB (ref 80.0–100.0)
Monocytes Absolute: 1.2 10*3/uL — ABNORMAL HIGH (ref 0.1–1.0)
Monocytes Relative: 7 %
Neutro Abs: 11 10*3/uL — ABNORMAL HIGH (ref 1.7–7.7)
Neutrophils Relative %: 72 %
Platelets: 490 10*3/uL — ABNORMAL HIGH (ref 150–400)
RBC: 2.32 MIL/uL — ABNORMAL LOW (ref 3.87–5.11)
RDW: 16.3 % — ABNORMAL HIGH (ref 11.5–15.5)
WBC: 15.5 10*3/uL — ABNORMAL HIGH (ref 4.0–10.5)
nRBC: 0 % (ref 0.0–0.2)

## 2018-09-11 LAB — TROPONIN I: Troponin I: <0.03 ng/mL (ref <0.03)

## 2018-09-11 LAB — GLUCOSE, CAPILLARY
Glucose-Capillary: 100 mg/dL — ABNORMAL HIGH (ref 70–99)
Glucose-Capillary: 118 mg/dL — ABNORMAL HIGH (ref 70–99)
Glucose-Capillary: 90 mg/dL (ref 70–99)

## 2018-09-11 LAB — RENAL FUNCTION PANEL
Albumin: 1.5 g/dL — ABNORMAL LOW (ref 3.5–5.0)
Anion gap: 13 (ref 5–15)
BUN: 57 mg/dL — ABNORMAL HIGH (ref 6–20)
CO2: 21 mmol/L — ABNORMAL LOW (ref 22–32)
Calcium: 8.3 mg/dL — ABNORMAL LOW (ref 8.9–10.3)
Chloride: 109 mmol/L (ref 98–111)
Creatinine, Ser: 4.23 mg/dL — ABNORMAL HIGH (ref 0.44–1.00)
GFR calc Af Amer: 13 mL/min — ABNORMAL LOW (ref >60)
GFR calc non Af Amer: 11 mL/min — ABNORMAL LOW (ref >60)
Glucose, Bld: 92 mg/dL (ref 70–99)
Phosphorus: 8.9 mg/dL — ABNORMAL HIGH (ref 2.5–4.6)
Potassium: 4.4 mmol/L (ref 3.5–5.1)
Sodium: 143 mmol/L (ref 135–145)

## 2018-09-11 MED ORDER — ACETAMINOPHEN 160 MG/5ML PO SOLN: 650.0000 mg | Freq: Four times a day (QID) | ORAL | 0 refills | Status: DC | PRN

## 2018-09-11 MED ORDER — ORAL CARE MOUTH RINSE: 15.0000 mL | Freq: Three times a day (TID) | OROMUCOSAL | 0 refills | Status: DC

## 2018-09-11 MED ORDER — QUETIAPINE FUMARATE 25 MG PO TABS: 25.0000 mg | ORAL_TABLET | Freq: Every day | ORAL | Status: DC

## 2018-09-11 MED ORDER — MORPHINE SULFATE 15 MG PO TABS: 15.0000 mg | ORAL_TABLET | Freq: Three times a day (TID) | ORAL | 0 refills | Status: DC

## 2018-09-11 MED ORDER — AMLODIPINE BESYLATE 5 MG PO TABS: 5.0000 mg | ORAL_TABLET | Freq: Every day | ORAL | Status: DC

## 2018-09-11 MED ORDER — RESOURCE THICKENUP CLEAR PO POWD: 1.0000 | ORAL | Status: DC | PRN

## 2018-09-11 MED ORDER — CALCIUM CARBONATE ANTACID 500 MG PO CHEW
400.0000 mg | CHEWABLE_TABLET | Freq: Two times a day (BID) | ORAL | Status: DC | PRN
  Administered 2018-09-11: 400 mg via ORAL
  Filled 2018-09-11 (×2): qty 2

## 2018-09-11 MED ORDER — HEPARIN SODIUM (PORCINE) 5000 UNIT/ML IJ SOLN: 5000.0000 [IU] | Freq: Three times a day (TID) | INTRAMUSCULAR | Status: DC

## 2018-09-11 MED ORDER — CHLORHEXIDINE GLUCONATE CLOTH 2 % EX PADS: 6.0000 | MEDICATED_PAD | Freq: Every day | CUTANEOUS | Status: DC

## 2018-09-11 MED ORDER — LABETALOL HCL 100 MG PO TABS: 100.0000 mg | ORAL_TABLET | Freq: Two times a day (BID) | ORAL | Status: DC

## 2018-09-11 MED ORDER — ONDANSETRON HCL 4 MG/2ML IJ SOLN: 4.0000 mg | Freq: Four times a day (QID) | INTRAMUSCULAR | 0 refills | Status: DC | PRN

## 2018-09-11 MED ORDER — CALCIUM CARBONATE ANTACID 500 MG PO CHEW: 400.0000 mg | CHEWABLE_TABLET | Freq: Two times a day (BID) | ORAL | Status: DC | PRN

## 2018-09-11 MED ORDER — AMOXICILLIN-POT CLAVULANATE 500-125 MG PO TABS: 500.0000 mg | ORAL_TABLET | Freq: Two times a day (BID) | ORAL | 0 refills | Status: AC

## 2018-09-11 MED ORDER — GUAIFENESIN 100 MG/5ML PO SOLN: 30.0000 mL | Freq: Four times a day (QID) | ORAL | 0 refills | Status: DC

## 2018-09-11 NOTE — Progress Notes (Signed)
Nurse tech went to check on pt during rounding. Pt reported to nurse tech that she was having chest pain. Nurse tech immediately notified Charge RN Estill Bamberg who told nurse tech to get EKG and vital signs. This RN was in another pt's room and pulled out by Charge RN to notify about pt's condition and that she was getting Nitroglycerin from Pyxis. RN and nurse tech obtain EKG which shows pt to be in sinus tach. RN obtains a set of vital signs and then administers nitroglycerin tablet and notifies on call resident. Pt has relief from chest pain with nitroglycerin tablet and reports no further chest pain. Will continue to monitor and assess pt and wait for further orders.

## 2018-09-11 NOTE — Progress Notes (Signed)
   Subjective: Ms. Graffeo had an episode of chest pain last night.  EKG showed sinus tachycardia.  Troponins are negative x1.  She does state that her initial episode resolved and that this morning she had again another episode of chest pain.  She said that it is a dual centrally located pain.  She does state that she has a history of gastric reflux and that she has been getting these medications here.  She also continues to have abdominal pain but has not gone her pain medication this morning.    Objective:  Vital signs in last 24 hours: Vitals:   09/10/18 1312 09/10/18 1750 09/10/18 2220 09/11/18 0848  BP: 117/81 113/75 (!) 142/92 125/79  Pulse: 96 92 (!) 105 99  Resp:   18 18  Temp:  98.7 F (37.1 C) 98.8 F (37.1 C) 97.8 F (36.6 C)  TempSrc:  Oral Oral   SpO2:  94% 95% 93%  Weight:      Height:       General: Lying in bed in no acute distress Cardiovascular: Normal rate, regular rhythm Respiratory: Clear to auscultation bilaterally. MSK: No lower extremity edema or tenderness to palpation  Assessment/Plan:  Principal Problem:   Rectal perforation s/p partial colectomy an ddiverting colostomy Active Problems:   Long term (current) use of opiate analgesic   Acute respiratory failure with hypoxemia (HCC)   Stercoral ulcer of rectum   History of ETT   AKI (acute kidney injury) ([place])   Dysphagia   Benign essential HTN   Tachycardia   Hypernatremia   Acute blood loss anemia   Hepatic abscess   Bacteremia due to Enterococcus   Colostomy in place [facility])  Ms. Joneswas admitted for colonic perforation status bowel resection with colostomy placement which has been complicated by bacteremia (currently treating), acute renal failure (improving), and ARDS (resolved).  Perforated Colon s/p bowel resection with colostomy placement:She is postsurgically medically stable for discharge today.  She has a follow-up appointment with the general surgeon who initially did her abdominal  surgery in 2 weeks.  She will need to continue dressing changes and pain medications per below. - Continue wet to dry TID - Continue morphine 15 mg 3 times daily  Bacteremia with fusobacterium and enterococcus complicated byHepatic abscess:Liver abscess culture is being reintubated for better growth.  I spoke with Dr. De Burrs of ID who recommends continuing Augmentin for 10 days with repeat liver ultrasound in 1 week.   - Continue Augmentin for 10 days per ID - Repeat liver ultrasound in 1 week  Acute renal failure: Creatinine  stable at 4.23.  We will continue to monitor.  Hypernatremia:Resolved with IV fluids.  Acutemetabolicencephalopathy:She is alert and oriented this morning. -  Continue Seroquel 25 mg twice daily  Hypertension:  Blood pressure within acceptable limits on p.o. labetalol and amlodipine. -Continue labetalol 100 mg twice daily - Continue amlodipine 5 mg daily  Anemia:  Hemoglobin decreased today to 7.5.  We will continue to monitor her and transfuse as necessary Hb <7.  Dispo: Anticipated discharge when SNF available.  [name], MD 09/11/2018, 9:21 AM Pager: [phone]

## 2018-09-11 NOTE — Clinical Social Work Note (Signed)
Pt has chosen Julie Williams. HTA auth started at this time.   [place], [place]

## 2018-09-11 NOTE — Progress Notes (Signed)
Subjective: Interval History: has no complaint.  Objective: Vital signs in last 24 hours: Temp:  [98.7 F (37.1 C)-99.7 F (37.6 C)] 98.8 F (37.1 C) (01/16 2220) Pulse Rate:  [91-105] 105 (01/16 2220) Resp:  [18] 18 (01/16 2220) BP: (113-161)/(75-97) 142/92 (01/16 2220) SpO2:  [94 %-95 %] 95 % (01/16 2220) Weight change:   Intake/Output from previous day: 01/16 0701 - 01/17 0700 In: 10 [I.V.:10] Out: 1000 [Urine:750; Stool:250] Intake/Output this shift: No intake/output data recorded.  General appearance: cooperative, pale and slowed mentation Resp: clear to auscultation bilaterally Cardio: S1, S2 normal and systolic murmur: systolic ejection 2/6, crescendo and decrescendo at 2nd left intercostal space GI: dressing lower midline , ostomy on L mid abdm, pos bs, mild distension Extremities: edema 2+  Lab Results: Recent Labs    09/10/18 0551 09/11/18 0616  WBC 14.0* 15.5*  HGB 8.4* 7.5*  HCT 25.6* 24.2*  PLT 571* 490*   BMET:  Recent Labs    09/09/18 0644 09/10/18 0552  NA 150* 146*  K 3.9 4.1  CL 114* 111  CO2 21* 23  GLUCOSE 105* 106*  BUN 66* 56*  CREATININE 4.21* 3.82*  CALCIUM 8.5* 8.5*   No results for input(s): PTH in the last 72 hours. Iron Studies: No results for input(s): IRON, TIBC, TRANSFERRIN, FERRITIN in the last 72 hours.  Studies/Results: No results found.  I have reviewed the patient's current medications.  Assessment/Plan: 1 AKI labs pending.  Acidemia improving. Still some vol xs . [place]. SNa improving.  2 Anemia lower 3 Colonic perf 4 Sepsis on po AB 5 narcotic addiction 6 encephalopathy improving P check chem, if better will s/o    LOS: 19 days   Jeneen Rinks Lillionna Nabi 09/11/2018,7:24 AM

## 2018-09-11 NOTE — Progress Notes (Signed)
[facility] for Infectious Disease  Date of Admission:  08/23/2018     Total days of antibiotics 24         ASSESSMENT/PLAN  Ms. Quimby is on Day 24 of antimicrobial therapy (Day 4 of Augmentin) for rectal perforation s/p partial colectomy and diverting colostomy complicated by peritonitis, Fusobacterium/Enterococcus bacteremia and liver abscess. She has remained afebrile with stable WBC count. Renal function is currently stable. Appears to be tolerating antibiotics with no adverse side effects. Plan for 10 days of Augmentin with end date of 09/21/18 and follow up imaging in 1 week. Disposition currently awaiting a SNF.   1. Continue Augmentin. 2. Follow up CT / [place] in 1 week to evaluate liver abscess for resolution. 3. Wound care per general surgery.  4. Renal function appears stable with Nephrology following. 5. ID will sign off and be available as needed during her hospitalization.  6. Follow up in ID clinic in 1 month.    Principal Problem:   Rectal perforation s/p partial colectomy an ddiverting colostomy Active Problems:   Long term (current) use of opiate analgesic   Acute respiratory failure with hypoxemia (HCC)   Stercoral ulcer of rectum   History of ETT   AKI (acute kidney injury) ([place])   Dysphagia   Benign essential HTN   Tachycardia   Hypernatremia   Acute blood loss anemia   Hepatic abscess   Bacteremia due to Enterococcus   Colostomy in place [facility])   . amLODipine  5 mg Oral Daily  . amoxicillin-clavulanate  500 mg Oral Q12H  . Chlorhexidine Gluconate Cloth  6 each Topical Daily  . feeding supplement  1 Container Oral TID BM  . guaiFENesin  30 mL Oral Q6H  . heparin  5,000 Units Subcutaneous Q8H  . labetalol  100 mg Oral BID  . mouth rinse  15 mL Mouth Rinse TID  . morphine  15 mg Oral TID  . QUEtiapine  25 mg Oral QHS  . sodium chloride flush  10-40 mL Intracatheter Q12H  . venlafaxine XR  37.5 mg Oral Q breakfast    SUBJECTIVE:  Afebrile  overnight with stable WBC count of 15.5. Liver abscess cultures without growth to date and holding for possible anaerobe. Creatinine has been stable. Has had a drop in hemoglobin from 8.4 to 7.5.  No acute events overnight.   Not doing so good. Would like a Coke. Wants to find her keys and cell phone.   Allergies  Allergen Reactions  . Bee Venom Swelling     Review of Systems: Review of Systems  Constitutional: Negative for chills, fever and weight loss.  Respiratory: Negative for cough, shortness of breath and wheezing.   Cardiovascular: Negative for chest pain and leg swelling.  Gastrointestinal: Negative for abdominal pain, constipation, diarrhea, nausea and vomiting.  Skin: Negative for rash.      OBJECTIVE: Vitals:   09/10/18 1312 09/10/18 1750 09/10/18 2220 09/11/18 0848  BP: 117/81 113/75 (!) 142/92 125/79  Pulse: 96 92 (!) 105 99  Resp:   18 18  Temp:  98.7 F (37.1 C) 98.8 F (37.1 C) 97.8 F (36.6 C)  TempSrc:  Oral Oral   SpO2:  94% 95% 93%  Weight:      Height:       Body mass index is 24.76 kg/m.  Physical Exam Constitutional:      General: She is not in acute distress.    Appearance: She is well-developed.  Cardiovascular:  Rate and Rhythm: Regular rhythm. Tachycardia present.     Heart sounds: Normal heart sounds.  Pulmonary:     Effort: Pulmonary effort is normal.     Breath sounds: Normal breath sounds.  Abdominal:     Comments: Midline dressing is clean and dry with no shadowing. RLQ dressing with yellow-green drainage and saturated through gauze.   Skin:    General: Skin is warm and dry.  Neurological:     Mental Status: She is alert and oriented to person, place, and time.     Lab Results Lab Results  Component Value Date   WBC 15.5 (H) 09/11/2018   HGB 7.5 (L) 09/11/2018   HCT 24.2 (L) 09/11/2018   MCV 104.3 (H) 09/11/2018   PLT 490 (H) 09/11/2018    Lab Results  Component Value Date   CREATININE 4.23 (H) 09/11/2018   BUN  57 (H) 09/11/2018   NA 143 09/11/2018   K 4.4 09/11/2018   CL 109 09/11/2018   CO2 21 (L) 09/11/2018    Lab Results  Component Value Date   ALT 13 08/31/2018   AST 21 08/31/2018   ALKPHOS 60 08/31/2018   BILITOT 0.7 08/31/2018     Microbiology: Recent Results (from the past 240 hour(s))  Aerobic/Anaerobic Culture (surgical/deep wound)     Status: None (Preliminary result)   Collection Time: 09/08/18  4:00 PM  Result Value Ref Range Status   Specimen Description ABSCESS  Final   Special Requests RIGHT PERIHEPATIC  Final   Gram Stain   Final    ABUNDANT WBC PRESENT, PREDOMINANTLY PMN NO ORGANISMS SEEN    Culture   Final    CULTURE REINCUBATED FOR BETTER GROWTH HOLDING FOR POSSIBLE ANAEROBE Performed at [facility], [address] [place], [place] [address]    Report Status PENDING  Incomplete     Terri Piedra, [place] [facility] [phone] Pager  09/11/2018  9:59 AM

## 2018-09-11 NOTE — Progress Notes (Signed)
[facility] Progress Note     Subjective: CC: cold Patient reports she is cold and had an accident, needs sheets changed. Tolerating diet, wants something to drink.  Dressing was not changed last night, but was changed again yesterday afternoon.   Objective: Vital signs in last 24 hours: Temp:  [98.7 F (37.1 C)-98.8 F (37.1 C)] 98.8 F (37.1 C) (01/16 2220) Pulse Rate:  [92-105] 105 (01/16 2220) Resp:  [18] 18 (01/16 2220) BP: (113-142)/(75-92) 142/92 (01/16 2220) SpO2:  [94 %-95 %] 95 % (01/16 2220) Last BM Date: 09/09/18  Intake/Output from previous day: 01/16 0701 - 01/17 0700 In: 10 [I.V.:10] Out: 1000 [Urine:750; Stool:250] Intake/Output this shift: No intake/output data recorded.  PE: Gen: Alert, NAD Card: Regular rate and rhythm Pulm: Normal effort YIF:OYDX,AJOINOMVEHMCNOB,SJGG generalized TTP, ostomy pink with brown stool in bag; midline wound with slough/necrosis at base which is improving with visible sutures  Lab Results:  Recent Labs    09/10/18 0551 09/11/18 0616  WBC 14.0* 15.5*  HGB 8.4* 7.5*  HCT 25.6* 24.2*  PLT 571* 490*   BMET Recent Labs    09/09/18 0644 09/10/18 0552  NA 150* 146*  K 3.9 4.1  CL 114* 111  CO2 21* 23  GLUCOSE 105* 106*  BUN 66* 56*  CREATININE 4.21* 3.82*  CALCIUM 8.5* 8.5*   PT/INR No results for input(s): LABPROT, INR in the last 72 hours. CMP     Component Value Date/Time   NA 146 (H) 09/10/2018 0552   K 4.1 09/10/2018 0552   CL 111 09/10/2018 0552   CO2 23 09/10/2018 0552   GLUCOSE 106 (H) 09/10/2018 0552   BUN 56 (H) 09/10/2018 0552   CREATININE 3.82 (H) 09/10/2018 0552   CREATININE 0.72 04/28/2014 1201   CALCIUM 8.5 (L) 09/10/2018 0552   PROT 5.9 (L) 08/31/2018 0500   ALBUMIN 1.5 (L) 09/10/2018 0552   AST 21 08/31/2018 0500   ALT 13 08/31/2018 0500   ALKPHOS 60 08/31/2018 0500   BILITOT 0.7 08/31/2018 0500   GFRNONAA 13 (L) 09/10/2018 0552   GFRNONAA >89 04/28/2014 1201   GFRAA 15 (L) 09/10/2018 0552   GFRAA >89 04/28/2014 1201   Lipase     Component Value Date/Time   LIPASE 87 (H) 08/23/2018 1834       Studies/Results: No results found.  Anti-infectives: Anti-infectives (From admission, onward)   Start     Dose/Rate Route Frequency Ordered Stop   09/08/18 1030  amoxicillin-clavulanate (AUGMENTIN) 500-125 MG per tablet 500 mg     500 mg Oral Every 12 hours 09/08/18 1020     09/04/18 1121  meropenem (MERREM) 1 g in sodium chloride 0.9 % 100 mL IVPB  Status:  Discontinued     1 g 200 mL/hr over 30 Minutes Intravenous Every 24 hours 09/03/18 1709 09/08/18 1020   08/30/18 1030  linezolid (ZYVOX) IVPB 600 mg  Status:  Discontinued     600 mg 300 mL/hr over 60 Minutes Intravenous Every 12 hours 08/30/18 0941 09/04/18 1437   08/28/18 2000  DAPTOmycin (CUBICIN) 640 mg in sodium chloride 0.9 % IVPB  Status:  Discontinued     640 mg 225.6 mL/hr over 30 Minutes Intravenous Every 48 hours 08/28/18 1330 08/30/18 0941   08/28/18 1400  meropenem (MERREM) 1 g in sodium chloride 0.9 % 100 mL IVPB  Status:  Discontinued     1 g 200 mL/hr over 30 Minutes Intravenous Every 12 hours 08/28/18 1330 09/03/18 1709  08/26/18 1030  DAPTOmycin (CUBICIN) 618.5 mg in sodium chloride 0.9 % IVPB  Status:  Discontinued     8 mg/kg  77.3 kg 224.7 mL/hr over 30 Minutes Intravenous Every 48 hours 08/26/18 0934 08/26/18 0935   08/26/18 1030  DAPTOmycin (CUBICIN) 620 mg in sodium chloride 0.9 % IVPB  Status:  Discontinued     620 mg 224.8 mL/hr over 30 Minutes Intravenous Every 48 hours 08/26/18 0935 08/27/18 1038   08/25/18 0300  vancomycin (VANCOCIN) IVPB 1000 mg/200 mL premix  Status:  Discontinued     1,000 mg 200 mL/hr over 60 Minutes Intravenous  Once 08/24/18 0122 08/24/18 1049   08/24/18 0100  vancomycin (VANCOCIN) IVPB 1000 mg/200 mL premix  Status:  Discontinued     1,000 mg 200 mL/hr over 60 Minutes Intravenous Every 12 hours 08/24/18 0045 08/24/18 0122   08/24/18  0100  piperacillin-tazobactam (ZOSYN) IVPB 2.25 g  Status:  Discontinued     2.25 g 100 mL/hr over 30 Minutes Intravenous Every 6 hours 08/24/18 0045 08/28/18 1304       Assessment/Plan Enterococcal bacteremia,fusobacterium and enterococcus/aspiration pneumonia-ID following HTN GERD Depression Alcohol abuse Chronic pain/narcotic dependence -On Morphine at home Former smoker VDRF/ARDS- aspiration pneumonia, resp cx grew few candida albicans Acute renal failure-creatinine3.82yesterday,trending down  Anemia-h/h7.5/24.2today, s/p1 unit PRBCs1/6and 1/14 Hypokalemia - resolved  Perforated colon/rectum S/p exploratory laparotomy, resection distal sigmoid colon and proximal rectum, diverting colostomy, wound vac placement 12/25 Dr. Noberto Retort - POD 23 -surgical pathshowed inflammation, no malignancy or diverticulitis identified -wound vac changed to wet to dryTID1/3/20, can decrease dressing changes to BID -JP drain removed 1/12 -CT scan 1/8 worsening pneumonia, no definite intraabdominal abscess or SBO; cannot rule out liver abscess Liver Abscess- IR aspiration of liver abscess1/14, cxs with no growth to date Dysuria - UA negative  ID -vancomycin 12/30>>12/30;zosyn 12/30>>08/28/18,daptomycin 1/1>>1/5, linezolid 1/5>>1/10;Meropenem 1/3>1/14;PO augmentin 1/14>> VTE -SCDs, sq heparin FEN -IVF,dysphagia 1 diet Foley -none Follow up - Dr. Noberto Retort  Plan:ContinueBIDwet to dry dressingchanges. CSW working on SNF placement, patient fine to go to SNF whenever bed available from surgical standpoint. Will need to follow up with original surgeon from [facility].   LOS: 19 days    Brigid Re , [facility] 09/11/2018, 7:57 AM Pager: [phone] Consults: [phone] Mon-Fri 7:00 am-4:30 pm Sat-Sun 7:00 am-11:30 am

## 2018-09-11 NOTE — Progress Notes (Signed)
Occupational Therapy Treatment Patient Details Name: Julie Williams MRN: [id number] DOB: 06-24-1966 Today's Date: 09/11/2018    History of present illness 53 year-old woman admitted to [facility] 12/24 with colonic perforation requiring colectomy, course complicated by aspiration pneumonia requiring mechanical ventilation 12/28 and bacteremia with fusobacterium and enterococcus, transferred to [facility] on 12/29 for management of ARDS.  She was extubated 09/04/17.  PMH includes: Rotator cuff syndrome Rt shoulder; cervical spine degeneration, essential HTN, depression, chronic pain with chronic opiate use    OT comments  Pt very self-limiting this session and requires max encouragement to participate in therapy session. Pt requiring maxA for bed mobility and to progress to long sitting (+2 for safety), though refused to transition to sitting EOB. Reinforced benefits of EOB/OOB activity and pt continues to refuse. Pt performed simple grooming ADL at bed level with setup assist/encouragment to complete. Due to pt limited participation discharge recommendations have been updated. Recommend SNF level therapy services at time of discharge to progress pt independence with ADL and mobility. Will continue to follow at this time and follow for pt participation/progression.    Follow Up Recommendations  SNF;Supervision/Assistance - 24 hour    Equipment Recommendations  None recommended by OT          Precautions / Restrictions Precautions Precautions: Fall Precaution Comments: abdominal wound, JP drain,  colostomy, watch BP Restrictions Weight Bearing Restrictions: No       Mobility Bed Mobility Overal bed mobility: Needs Assistance Bed Mobility: Rolling Rolling: Max assist   Supine to sit: Max assist     General bed mobility comments: Assist with rolling to adjust pad. Assist to come into long sitting with max encouragement.  Transfers                 General transfer comment: Pt  declined any mobility EOB or OOB despite max encouragement    Balance       Sitting balance - Comments: pt declined OOB mobility                                    ADL either performed or assessed with clinical judgement   ADL Overall ADL's : Needs assistance/impaired     Grooming: Wash/dry hands;Set up;Bed level                                 General ADL Comments: pt very self-limiting despite max encouragement, reports she has "already prepaid for her funeral"      Vision       Perception     Praxis      Cognition Arousal/Alertness: Lethargic Behavior During Therapy: (irritated) Overall Cognitive Status: Impaired/Different from baseline Area of Impairment: Orientation;Attention;Memory;Safety/judgement;Awareness                 Orientation Level: Disoriented to;Time;Situation Current Attention Level: Sustained Memory: Decreased short-term memory   Safety/Judgement: Decreased awareness of deficits Awareness: Intellectual Problem Solving: Requires verbal cues;Requires tactile cues;Difficulty sequencing;Decreased initiation;Slow processing General Comments: Pt reports having a depressed mood. "I already paid for my funeral." Tangential speech noted throughout session. Not making sense.         Exercises General Exercises - Lower Extremity Ankle Circles/Pumps: AROM;10 reps   Shoulder Instructions       General Comments      Pertinent Vitals/ Pain  Pain Assessment: Faces Faces Pain Scale: Hurts little more Pain Location: abdomen during mobility Pain Descriptors / Indicators: Grimacing;Guarding;Moaning Pain Intervention(s): Limited activity within patient's tolerance;Monitored during session;Repositioned  Home Living                                          Prior Functioning/Environment              Frequency  Min 2X/week        Progress Toward Goals  OT Goals(current goals can now be  found in the care plan section)  Progress towards OT goals: Not progressing toward goals - comment(pt self-limiting this session)  Acute Rehab OT Goals Patient Stated Goal: less pain OT Goal Formulation: With patient Time For Goal Achievement: 09/23/18 Potential to Achieve Goals: [place] Discharge plan needs to be updated    Co-evaluation    PT/OT/SLP Co-Evaluation/Treatment: Yes Reason for Co-Treatment: For patient/therapist safety;To address functional/ADL transfers;Necessary to address cognition/behavior during functional activity;Complexity of the patient's impairments (multi-system involvement) PT goals addressed during session: Mobility/safety with mobility;Balance;Strengthening/ROM OT goals addressed during session: Strengthening/ROM;ADL's and self-care      AM-PAC OT "6 Clicks" Daily Activity     Outcome Measure   Help from another person eating meals?: A Lot Help from another person taking care of personal grooming?: A Lot Help from another person toileting, which includes using toliet, bedpan, or urinal?: Total Help from another person bathing (including washing, rinsing, drying)?: A Lot Help from another person to put on and taking off regular upper body clothing?: A Lot Help from another person to put on and taking off regular lower body clothing?: Total 6 Click Score: 10    End of Session    OT Visit Diagnosis: Unsteadiness on feet (R26.81);Muscle weakness (generalized) (M62.81)   Activity Tolerance Other (comment)(pt self-limiting)   Patient Left in bed;with call bell/phone within reach;with bed alarm set   Nurse Communication Mobility status        Time: 7062-3762 OT Time Calculation (min): 23 min  Charges: OT General Charges $OT Visit: 1 Visit OT Treatments $Self Care/Home Management : 8-22 mins  Lou Cal, [place] Pager [phone] Office [phone]    Raymondo Band 09/11/2018, 3:54  PM

## 2018-09-11 NOTE — Progress Notes (Addendum)
Physical Therapy Treatment Patient Details Name: Julie Williams MRN: [id number] DOB: 1966-06-05 Today's Date: 09/11/2018    History of Present Illness 53 year-old woman admitted to [facility] 12/24 with colonic perforation requiring colectomy, course complicated by aspiration pneumonia requiring mechanical ventilation 12/28 and bacteremia with fusobacterium and enterococcus, transferred to [facility] on 12/29 for management of ARDS.  She was extubated 09/04/17.  PMH includes: Rotator cuff syndrome Rt shoulder; cervical spine degeneration, essential HTN, depression, chronic pain with chronic opiate use     PT Comments    Patient not progressing with mobility secondary to reporting feeling depressed. "I already paid for my funeral." Despite max encouragement and explanation of importance of mobility, pt continuing to decline any movement OOB. Tolerated long sitting in bed for pressure relief and rolling with Max A. Pt with definite cognitive deficits. Recommend getting psych involved as well as having a chaplain come by to give pt someone to talk with to help with her mood. Discharge recommendation updated to SNF as pt denied by CIR due to lack of family support. Will follow.   Follow Up Recommendations  SNF     Equipment Recommendations  Other (comment)(defer)    Recommendations for Other Services       Precautions / Restrictions Precautions Precautions: Fall Precaution Comments: abdominal wound, JP drain,  colostomy, watch BP Restrictions Weight Bearing Restrictions: No    Mobility  Bed Mobility Overal bed mobility: Needs Assistance Bed Mobility: Rolling Rolling: Max assist   Supine to sit: Max assist     General bed mobility comments: Assist with rolling to adjust pad. Assist to come into long sitting with max encouragement.  Transfers                 General transfer comment: Pt declined any mobility EOB or OOB despite max encouragement  Ambulation/Gait                  Stairs             Wheelchair Mobility    Modified Rankin (Stroke Patients Only)       Balance       Sitting balance - Comments: pt declined OOB mobility                                     Cognition Arousal/Alertness: Lethargic Behavior During Therapy: (irritated) Overall Cognitive Status: Impaired/Different from baseline Area of Impairment: Orientation;Attention;Memory;Safety/judgement;Awareness                 Orientation Level: Disoriented to;Time;Situation Current Attention Level: Sustained Memory: Decreased short-term memory   Safety/Judgement: Decreased awareness of deficits Awareness: Intellectual Problem Solving: Requires verbal cues;Requires tactile cues;Difficulty sequencing;Decreased initiation;Slow processing General Comments: Pt reports having a depressed mood. "I already paid for my funeral." Tangential speech noted throughout session. Not making sense.       Exercises General Exercises - Lower Extremity Ankle Circles/Pumps: AROM;10 reps    General Comments        Pertinent Vitals/Pain Pain Assessment: Faces Faces Pain Scale: Hurts little more Pain Location: abdomen during mobility Pain Descriptors / Indicators: Grimacing;Guarding;Moaning Pain Intervention(s): Limited activity within patient's tolerance;Monitored during session;Repositioned    Home Living                      Prior Function            PT Goals (current goals  can now be found in the care plan section) Progress towards PT goals: Not progressing toward goals - comment(secondary to unwillingness to participate, confusion, reports of depression)    Frequency    Min 2X/week      PT Plan Discharge plan needs to be updated;Frequency needs to be updated    Co-evaluation PT/OT/SLP Co-Evaluation/Treatment: Yes Reason for Co-Treatment: For patient/therapist safety;Necessary to address cognition/behavior during functional  activity;To address functional/ADL transfers;Complexity of the patient's impairments (multi-system involvement) PT goals addressed during session: Mobility/safety with mobility;Balance;Strengthening/ROM        AM-PAC PT "6 Clicks" Mobility   Outcome Measure  Help needed turning from your back to your side while in a flat bed without using bedrails?: A Lot Help needed moving from lying on your back to sitting on the side of a flat bed without using bedrails?: A Lot Help needed moving to and from a bed to a chair (including a wheelchair)?: A Lot Help needed standing up from a chair using your arms (e.g., wheelchair or bedside chair)?: A Lot Help needed to walk in hospital room?: A Lot Help needed climbing 3-5 steps with a railing? : Total 6 Click Score: 11    End of Session   Activity Tolerance: Other (comment)(limited due to confusion, unwillingness) Patient left: in bed;with call bell/phone within reach;with bed alarm set Nurse Communication: Mobility status PT Visit Diagnosis: Muscle weakness (generalized) (M62.81);Other abnormalities of gait and mobility (R26.89)     Time: 7262-0355 PT Time Calculation (min) (ACUTE ONLY): 22 min  Charges:  $Therapeutic Activity: 8-22 mins                     Wray Kearns, PT, DPT Acute Rehabilitation Services Pager [phone] Office [phone]       Marguarite Arbour A Sabra Heck 09/11/2018, 12:57 PM

## 2018-09-11 NOTE — Progress Notes (Signed)
NCM received call from  [facility] at [facility], she states ltach was denied by her medical director because she is not on tpn, she is on dysp 1 diet and the wound is healing.   If you would like to do peer to peer you can.  NCM offered peer to peer to MD, he states we will continue to pursue snf.

## 2018-09-12 LAB — CBC WITH DIFFERENTIAL/PLATELET
Abs Immature Granulocytes: 0.28 10*3/uL — ABNORMAL HIGH (ref 0.00–0.07)
Basophils Absolute: 0.1 10*3/uL (ref 0.0–0.1)
Basophils Relative: 1 %
Eosinophils Absolute: 0.4 10*3/uL (ref 0.0–0.5)
Eosinophils Relative: 3 %
HCT: 23.3 % — ABNORMAL LOW (ref 36.0–46.0)
Hemoglobin: 7 g/dL — ABNORMAL LOW (ref 12.0–15.0)
Immature Granulocytes: 2 %
Lymphocytes Relative: 16 %
Lymphs Abs: 2.5 10*3/uL (ref 0.7–4.0)
MCH: 30.6 pg (ref 26.0–34.0)
MCHC: 30 g/dL (ref 30.0–36.0)
MCV: 101.7 fL — AB (ref 80.0–100.0)
MONO ABS: 1.1 10*3/uL — AB (ref 0.1–1.0)
MONOS PCT: 7 %
Neutro Abs: 11.3 10*3/uL — ABNORMAL HIGH (ref 1.7–7.7)
Neutrophils Relative %: 71 %
Platelets: 421 10*3/uL — ABNORMAL HIGH (ref 150–400)
RBC: 2.29 MIL/uL — ABNORMAL LOW (ref 3.87–5.11)
RDW: 16 % — ABNORMAL HIGH (ref 11.5–15.5)
WBC: 15.6 10*3/uL — ABNORMAL HIGH (ref 4.0–10.5)
nRBC: 0 % (ref 0.0–0.2)

## 2018-09-12 LAB — RENAL FUNCTION PANEL
Albumin: 1.5 g/dL — ABNORMAL LOW (ref 3.5–5.0)
Anion gap: 14 (ref 5–15)
BUN: 61 mg/dL — AB (ref 6–20)
CO2: 21 mmol/L — ABNORMAL LOW (ref 22–32)
Calcium: 8.1 mg/dL — ABNORMAL LOW (ref 8.9–10.3)
Chloride: 108 mmol/L (ref 98–111)
Creatinine, Ser: 4.62 mg/dL — ABNORMAL HIGH (ref 0.44–1.00)
GFR calc Af Amer: 12 mL/min — ABNORMAL LOW (ref >60)
GFR calc non Af Amer: 10 mL/min — ABNORMAL LOW (ref >60)
Glucose, Bld: 94 mg/dL (ref 70–99)
Phosphorus: 9.6 mg/dL — ABNORMAL HIGH (ref 2.5–4.6)
Potassium: 4.6 mmol/L (ref 3.5–5.1)
Sodium: 143 mmol/L (ref 135–145)

## 2018-09-12 LAB — GLUCOSE, CAPILLARY
Glucose-Capillary: 105 mg/dL — ABNORMAL HIGH (ref 70–99)
Glucose-Capillary: 105 mg/dL — ABNORMAL HIGH (ref 70–99)
Glucose-Capillary: 106 mg/dL — ABNORMAL HIGH (ref 70–99)
Glucose-Capillary: 111 mg/dL — ABNORMAL HIGH (ref 70–99)

## 2018-09-12 LAB — HEMOGLOBIN AND HEMATOCRIT, BLOOD
HEMATOCRIT: 28.7 % — AB (ref 36.0–46.0)
Hemoglobin: 9.1 g/dL — ABNORMAL LOW (ref 12.0–15.0)

## 2018-09-12 LAB — PREPARE RBC (CROSSMATCH)

## 2018-09-12 MED ORDER — SODIUM CHLORIDE 0.9% IV SOLUTION
Freq: Once | INTRAVENOUS | Status: AC
  Administered 2018-09-12: 15:00:00 via INTRAVENOUS

## 2018-09-12 MED ORDER — DARBEPOETIN ALFA 150 MCG/0.3ML IJ SOSY
150.0000 ug | PREFILLED_SYRINGE | Freq: Once | INTRAMUSCULAR | Status: AC
  Administered 2018-09-12: 150 ug via SUBCUTANEOUS
  Filled 2018-09-12: qty 0.3

## 2018-09-12 NOTE — Progress Notes (Signed)
Subjective: Interval History: Concern she has of healing.  Objective: Vital signs in last 24 hours: Temp:  [97.5 F (36.4 C)-99 F (37.2 C)] 97.5 F (36.4 C) (01/17 2222) Pulse Rate:  [90-106] 106 (01/17 2222) Resp:  [16-18] 16 (01/17 2222) BP: (102-125)/(64-87) 122/87 (01/17 2222) SpO2:  [93 %-95 %] 93 % (01/17 2222) Weight:  [64.2 kg] 64.2 kg (01/18 0500) Weight change:   Intake/Output from previous day: 01/17 0701 - 01/18 0700 In: 370 [P.O.:360; I.V.:10] Out: 850 [Urine:850] Intake/Output this shift: No intake/output data recorded.  General appearance: alert, cooperative, no distress and pale Resp: clear to auscultation bilaterally Cardio: S1, S2 normal and systolic murmur: systolic ejection 2/6, crescendo and decrescendo at 2nd left intercostal space GI: lower midline incision, ostomy L mid abdm, pos bs, mild distension Extremities: edema 2+  Lab Results: Recent Labs    09/11/18 0616 09/12/18 0608  WBC 15.5* 15.6*  HGB 7.5* 7.0*  HCT 24.2* 23.3*  PLT 490* 421*   BMET:  Recent Labs    09/11/18 0616 09/12/18 0608  NA 143 143  K 4.4 4.6  CL 109 108  CO2 21* 21*  GLUCOSE 92 94  BUN 57* 61*  CREATININE 4.23* 4.62*  CALCIUM 8.3* 8.1*   No results for input(s): PTH in the last 72 hours. Iron Studies: No results for input(s): IRON, TIBC, TRANSFERRIN, FERRITIN in the last 72 hours.  Studies/Results: No results found.  I have reviewed the patient's current medications.  Assessment/Plan: 1 AKI chem pending, has plateaued, hopefully will get better. GFR still low.  Mild acidemia , if not better will tx. 2 Anemia slowly lower.  Will add esa/ 3 colonic perf 4 Sepsis on po AB 5 narcotic addiction P check chem , add esa.      LOS: 20 days   Jeneen Rinks Quency Tober 09/12/2018,7:02 AM

## 2018-09-12 NOTE — Progress Notes (Signed)
CSW spoke with [facility] [employer]. We have insurance authorization. CSW paged teaching service.   Awaiting a phone call back.   Domenic Schwab, MSW, [place]

## 2018-09-12 NOTE — Progress Notes (Signed)
CSW spoke with RN about patient. RN stated that the patient's hemoglobin was low. The patient will be receiving a blood transfusion.   CSW informed facility that the patient's hemoglobin is low and that it would be later in the day when she received it. Facility is able to take the patient tomorrow morning.   CSW will continue to follow.   Domenic Schwab, MSW, [place]

## 2018-09-12 NOTE — Progress Notes (Signed)
   Subjective:  No acute events overnight. Julie Williams remains stable. Julie Williams states Julie Williams is as good as Julie Williams can be. Julie Williams does not like the hospital food and is therefore not eating well.   Objective:  Vital signs in last 24 hours: Vitals:   09/11/18 0848 09/11/18 1725 09/11/18 2222 09/11/18 2222  BP: 125/79 102/64 122/87 122/87  Pulse: 99 95 90 (!) 106  Resp: 18 18  16   Temp: 97.8 F (36.6 C) 99 F (37.2 C)  (!) 97.5 F (36.4 C)  TempSrc:  Oral  Oral  SpO2: 93% 95%  93%  Weight:      Height:       General: chronically ill appearing female in NAD  CV: RRR, no mrg  Pulm: appears comfortable on RA  Abd: colostomy bag on LLL with brown stool, midline incision with c/d/i dressing  Ext: warm and well perfused, no edema   Assessment/Plan:  Principal Problem:   Rectal perforation s/p partial colectomy an ddiverting colostomy Active Problems:   Long term (current) use of opiate analgesic   Acute respiratory failure with hypoxemia (HCC)   Stercoral ulcer of rectum   History of ETT   AKI (acute kidney injury) (HCC)   Dysphagia   Benign essential HTN   Tachycardia   Hypernatremia   Acute blood loss anemia   Hepatic abscess   Bacteremia due to Enterococcus   Colostomy in place [facility])  Ms. Joneswas admitted for colonic perforation status bowel resection with colostomy placement which has been complicated by bacteremia (currently treating), acute renal failure (improving), and ARDS (resolved).  # Perforated Colon s/p partial colectomy with colostomy placement:General surgery following and recommending BID wet to dry dressings to midline incision.  Julie Williams has a follow-up appointment with the general surgeon who initially did her abdominal surgery in 2 weeks.  Julie Williams will need to continue dressing changes and pain medications per below. - Continue wet to dry BID  - Continue morphine 15 mg 3 times daily  # Bacteremia with fusobacterium and enterococcus complicated byhepatic abscess:Liver  abscess culture pending, reincubated for better growth. Per ID, we will continue Augmentin x 9 days followed by a repeat CT/US of abdomen 1 week after completion of antibiotic therapy. Will follow up blood cx and update provider at SNF if changes in antibiotic therapy are needed.   # Acute renal failure: Renal function remains stable. Julie Williams is slightly acidemic with bicarb of 21 as well as anemic with Hgb of 7. Nephrology has started ESA and recommend adding sodium bicarb if acidemia worsens. I spoke with Dr. Jimmy Footman today, patient is stable for discharge from a renal stand point and will need repeat labs in 5-7 days.   # Hypernatremia:Resolved.   # Acutemetabolicencephalopathy:Resolved. Continue Seroquel 25 mg twice daily  # Hypertension: Well controlled on amlodipine and labetalol.   # Anemia:  Hgb 7 this AM. Nephrology started ESA. Will transfuse 1 unit of pRBCs prior to discharge.   Dispo: Anticipated discharge to SNF today after blood transfusion.   Welford Roche, MD 09/12/2018, 5:21 AM Pager: [phone]

## 2018-09-13 DIAGNOSIS — B952 Enterococcus as the cause of diseases classified elsewhere: Secondary | ICD-10-CM | POA: Diagnosis not present

## 2018-09-13 DIAGNOSIS — R404 Transient alteration of awareness: Secondary | ICD-10-CM | POA: Diagnosis not present

## 2018-09-13 DIAGNOSIS — Z87891 Personal history of nicotine dependence: Secondary | ICD-10-CM | POA: Diagnosis not present

## 2018-09-13 DIAGNOSIS — Z9049 Acquired absence of other specified parts of digestive tract: Secondary | ICD-10-CM | POA: Diagnosis not present

## 2018-09-13 DIAGNOSIS — R509 Fever, unspecified: Secondary | ICD-10-CM | POA: Diagnosis not present

## 2018-09-13 DIAGNOSIS — K219 Gastro-esophageal reflux disease without esophagitis: Secondary | ICD-10-CM | POA: Diagnosis not present

## 2018-09-13 DIAGNOSIS — D72829 Elevated white blood cell count, unspecified: Secondary | ICD-10-CM | POA: Diagnosis not present

## 2018-09-13 DIAGNOSIS — M6281 Muscle weakness (generalized): Secondary | ICD-10-CM | POA: Diagnosis not present

## 2018-09-13 DIAGNOSIS — R1310 Dysphagia, unspecified: Secondary | ICD-10-CM | POA: Diagnosis not present

## 2018-09-13 DIAGNOSIS — R279 Unspecified lack of coordination: Secondary | ICD-10-CM | POA: Diagnosis not present

## 2018-09-13 DIAGNOSIS — R42 Dizziness and giddiness: Secondary | ICD-10-CM | POA: Diagnosis not present

## 2018-09-13 DIAGNOSIS — I1311 Hypertensive heart and chronic kidney disease without heart failure, with stage 5 chronic kidney disease, or end stage renal disease: Secondary | ICD-10-CM | POA: Diagnosis not present

## 2018-09-13 DIAGNOSIS — R410 Disorientation, unspecified: Secondary | ICD-10-CM | POA: Diagnosis not present

## 2018-09-13 DIAGNOSIS — K631 Perforation of intestine (nontraumatic): Secondary | ICD-10-CM | POA: Diagnosis not present

## 2018-09-13 DIAGNOSIS — F419 Anxiety disorder, unspecified: Secondary | ICD-10-CM | POA: Diagnosis not present

## 2018-09-13 DIAGNOSIS — H9190 Unspecified hearing loss, unspecified ear: Secondary | ICD-10-CM | POA: Diagnosis not present

## 2018-09-13 DIAGNOSIS — S31109D Unspecified open wound of abdominal wall, unspecified quadrant without penetration into peritoneal cavity, subsequent encounter: Secondary | ICD-10-CM | POA: Diagnosis not present

## 2018-09-13 DIAGNOSIS — Z981 Arthrodesis status: Secondary | ICD-10-CM | POA: Diagnosis not present

## 2018-09-13 DIAGNOSIS — D649 Anemia, unspecified: Secondary | ICD-10-CM | POA: Diagnosis not present

## 2018-09-13 DIAGNOSIS — D696 Thrombocytopenia, unspecified: Secondary | ICD-10-CM | POA: Diagnosis not present

## 2018-09-13 DIAGNOSIS — Z800 Family history of malignant neoplasm of digestive organs: Secondary | ICD-10-CM | POA: Diagnosis not present

## 2018-09-13 DIAGNOSIS — R1084 Generalized abdominal pain: Secondary | ICD-10-CM | POA: Diagnosis not present

## 2018-09-13 DIAGNOSIS — Z933 Colostomy status: Secondary | ICD-10-CM | POA: Diagnosis not present

## 2018-09-13 DIAGNOSIS — G9341 Metabolic encephalopathy: Secondary | ICD-10-CM | POA: Diagnosis not present

## 2018-09-13 DIAGNOSIS — K56600 Partial intestinal obstruction, unspecified as to cause: Secondary | ICD-10-CM | POA: Diagnosis not present

## 2018-09-13 DIAGNOSIS — Z8719 Personal history of other diseases of the digestive system: Secondary | ICD-10-CM | POA: Diagnosis not present

## 2018-09-13 DIAGNOSIS — Z8249 Family history of ischemic heart disease and other diseases of the circulatory system: Secondary | ICD-10-CM | POA: Diagnosis not present

## 2018-09-13 DIAGNOSIS — D62 Acute posthemorrhagic anemia: Secondary | ICD-10-CM | POA: Diagnosis not present

## 2018-09-13 DIAGNOSIS — Z433 Encounter for attention to colostomy: Secondary | ICD-10-CM | POA: Diagnosis not present

## 2018-09-13 DIAGNOSIS — D500 Iron deficiency anemia secondary to blood loss (chronic): Secondary | ICD-10-CM | POA: Diagnosis not present

## 2018-09-13 DIAGNOSIS — Z743 Need for continuous supervision: Secondary | ICD-10-CM | POA: Diagnosis not present

## 2018-09-13 DIAGNOSIS — R52 Pain, unspecified: Secondary | ICD-10-CM | POA: Diagnosis not present

## 2018-09-13 DIAGNOSIS — L98492 Non-pressure chronic ulcer of skin of other sites with fat layer exposed: Secondary | ICD-10-CM | POA: Diagnosis not present

## 2018-09-13 DIAGNOSIS — N170 Acute kidney failure with tubular necrosis: Secondary | ICD-10-CM | POA: Diagnosis not present

## 2018-09-13 DIAGNOSIS — J9601 Acute respiratory failure with hypoxia: Secondary | ICD-10-CM | POA: Diagnosis not present

## 2018-09-13 DIAGNOSIS — R0902 Hypoxemia: Secondary | ICD-10-CM | POA: Diagnosis not present

## 2018-09-13 DIAGNOSIS — G8929 Other chronic pain: Secondary | ICD-10-CM | POA: Diagnosis not present

## 2018-09-13 DIAGNOSIS — Z79899 Other long term (current) drug therapy: Secondary | ICD-10-CM | POA: Diagnosis not present

## 2018-09-13 DIAGNOSIS — I10 Essential (primary) hypertension: Secondary | ICD-10-CM | POA: Diagnosis not present

## 2018-09-13 DIAGNOSIS — R7881 Bacteremia: Secondary | ICD-10-CM | POA: Diagnosis not present

## 2018-09-13 DIAGNOSIS — F339 Major depressive disorder, recurrent, unspecified: Secondary | ICD-10-CM | POA: Diagnosis not present

## 2018-09-13 DIAGNOSIS — Z9889 Other specified postprocedural states: Secondary | ICD-10-CM | POA: Diagnosis not present

## 2018-09-13 DIAGNOSIS — F329 Major depressive disorder, single episode, unspecified: Secondary | ICD-10-CM | POA: Diagnosis not present

## 2018-09-13 DIAGNOSIS — I87303 Chronic venous hypertension (idiopathic) without complications of bilateral lower extremity: Secondary | ICD-10-CM | POA: Diagnosis not present

## 2018-09-13 DIAGNOSIS — N179 Acute kidney failure, unspecified: Secondary | ICD-10-CM | POA: Diagnosis not present

## 2018-09-13 DIAGNOSIS — M542 Cervicalgia: Secondary | ICD-10-CM | POA: Diagnosis not present

## 2018-09-13 DIAGNOSIS — D638 Anemia in other chronic diseases classified elsewhere: Secondary | ICD-10-CM | POA: Diagnosis not present

## 2018-09-13 DIAGNOSIS — G894 Chronic pain syndrome: Secondary | ICD-10-CM | POA: Diagnosis not present

## 2018-09-13 DIAGNOSIS — R031 Nonspecific low blood-pressure reading: Secondary | ICD-10-CM | POA: Diagnosis not present

## 2018-09-13 DIAGNOSIS — R5381 Other malaise: Secondary | ICD-10-CM | POA: Diagnosis not present

## 2018-09-13 DIAGNOSIS — E871 Hypo-osmolality and hyponatremia: Secondary | ICD-10-CM | POA: Diagnosis not present

## 2018-09-13 DIAGNOSIS — Z9119 Patient's noncompliance with other medical treatment and regimen: Secondary | ICD-10-CM | POA: Diagnosis not present

## 2018-09-13 DIAGNOSIS — R109 Unspecified abdominal pain: Secondary | ICD-10-CM | POA: Diagnosis not present

## 2018-09-13 DIAGNOSIS — K750 Abscess of liver: Secondary | ICD-10-CM | POA: Diagnosis not present

## 2018-09-13 DIAGNOSIS — D619 Aplastic anemia, unspecified: Secondary | ICD-10-CM | POA: Diagnosis not present

## 2018-09-13 DIAGNOSIS — Z91030 Bee allergy status: Secondary | ICD-10-CM | POA: Diagnosis not present

## 2018-09-13 DIAGNOSIS — L98499 Non-pressure chronic ulcer of skin of other sites with unspecified severity: Secondary | ICD-10-CM | POA: Diagnosis not present

## 2018-09-13 DIAGNOSIS — R000 Tachycardia, unspecified: Secondary | ICD-10-CM | POA: Diagnosis not present

## 2018-09-13 LAB — BPAM RBC
Blood Product Expiration Date: 202002132359
ISSUE DATE / TIME: 202001181513
Unit Type and Rh: 7300

## 2018-09-13 LAB — URINALYSIS, ROUTINE W REFLEX MICROSCOPIC
Bilirubin Urine: NEGATIVE
Glucose, UA: NEGATIVE mg/dL
Ketones, ur: NEGATIVE mg/dL
Nitrite: NEGATIVE
Protein, ur: 30 mg/dL — AB
Specific Gravity, Urine: 1.006 (ref 1.005–1.030)
pH: 6 (ref 5.0–8.0)

## 2018-09-13 LAB — RENAL FUNCTION PANEL
Albumin: 1.5 g/dL — ABNORMAL LOW (ref 3.5–5.0)
Anion gap: 14 (ref 5–15)
BUN: 57 mg/dL — ABNORMAL HIGH (ref 6–20)
CHLORIDE: 111 mmol/L (ref 98–111)
CO2: 22 mmol/L (ref 22–32)
Calcium: 8.3 mg/dL — ABNORMAL LOW (ref 8.9–10.3)
Creatinine, Ser: 4.47 mg/dL — ABNORMAL HIGH (ref 0.44–1.00)
GFR calc Af Amer: 12 mL/min — ABNORMAL LOW (ref >60)
GFR calc non Af Amer: 10 mL/min — ABNORMAL LOW (ref >60)
Glucose, Bld: 100 mg/dL — ABNORMAL HIGH (ref 70–99)
Phosphorus: 10 mg/dL — ABNORMAL HIGH (ref 2.5–4.6)
Potassium: 4.7 mmol/L (ref 3.5–5.1)
Sodium: 147 mmol/L — ABNORMAL HIGH (ref 135–145)

## 2018-09-13 LAB — CBC
HCT: 27 % — ABNORMAL LOW (ref 36.0–46.0)
Hemoglobin: 8.7 g/dL — ABNORMAL LOW (ref 12.0–15.0)
MCH: 32.1 pg (ref 26.0–34.0)
MCHC: 32.2 g/dL (ref 30.0–36.0)
MCV: 99.6 fL (ref 80.0–100.0)
Platelets: 443 10*3/uL — ABNORMAL HIGH (ref 150–400)
RBC: 2.71 MIL/uL — ABNORMAL LOW (ref 3.87–5.11)
RDW: 17.4 % — ABNORMAL HIGH (ref 11.5–15.5)
WBC: 16.8 10*3/uL — ABNORMAL HIGH (ref 4.0–10.5)
nRBC: 0 % (ref 0.0–0.2)

## 2018-09-13 LAB — TYPE AND SCREEN
ABO/RH(D): B POS
Antibody Screen: NEGATIVE
UNIT DIVISION: 0

## 2018-09-13 LAB — CREATININE, URINE, RANDOM: Creatinine, Urine: 33.32 mg/dL

## 2018-09-13 LAB — GLUCOSE, CAPILLARY: Glucose-Capillary: 102 mg/dL — ABNORMAL HIGH (ref 70–99)

## 2018-09-13 LAB — SODIUM, URINE, RANDOM: Sodium, Ur: 65 mmol/L

## 2018-09-13 NOTE — Progress Notes (Signed)
   Subjective: Julie Williams has no acute complaints overnight.  She does continue to have abdominal pain but states that the pain medications are helping somewhat.  She is upset that she has not been able to find her phone or house keys since being transferred from [facility].  Objective:  Vital signs in last 24 hours: Vitals:   09/12/18 2327 09/13/18 0500 09/13/18 0824 09/13/18 0848  BP: 137/89  118/78 112/82  Pulse: (!) 119  93 93  Resp: 16  16 18   Temp: 98.9 F (37.2 C)  98.6 F (37 C) 97.8 F (36.6 C)  TempSrc: Oral  Oral   SpO2: 92%  92% 92%  Weight:  64.8 kg    Height:       General: Lying in bed in no acute distress. Neuro: Alert and oriented Psych: Normal mood and behavior.  Assessment/Plan:  Principal Problem:   Rectal perforation s/p partial colectomy an ddiverting colostomy Active Problems:   Long term (current) use of opiate analgesic   Acute respiratory failure with hypoxemia (HCC)   Stercoral ulcer of rectum   History of ETT   AKI (acute kidney injury) ([place])   Dysphagia   Benign essential HTN   Tachycardia   Hypernatremia   Acute blood loss anemia   Hepatic abscess   Bacteremia due to Enterococcus   Colostomy in place [facility])   JulieJoneswas admitted for colonic perforation status bowel resection with colostomy placement which has been complicated by bacteremia(currently treating), acute renal failure(improving), and ARDS(resolved).  # Perforated Colon s/p partial colectomy with colostomy placement:General surgery following and recommending BID wet to dry dressings to midline incision.  She has a follow-up appointment with the general surgeon who initially did her abdominal surgery in 2 weeks.  She will need to continue dressing changes and pain medications per below. - Continue wet to dry BID  - Continue morphine 15 mg 3 times daily  # Bacteremia with fusobacterium and enterococcus complicated byhepatic abscess:Liver abscess culture pending,  reincubated for better growth. Per ID, we will continue Augmentin x 8 days followed by a repeat CT/US of abdomen 1 week after completion of antibiotic therapy. Will follow up blood cx and update provider at SNF if changes in antibiotic therapy are needed.   # Acute renal failure: Renal function remains stable. She will need repeat labs in 5-7 days.   # Hypernatremia:Slightly hypernatremic today at 147. Will have her increase PO intake today as this is likely due to a free water deficit. She will need a repeat BMP in several days.   # Acutemetabolicencephalopathy:Resolved. Continue Seroquel 25 mg twice daily  # Hypertension:Well controlled on amlodipine and labetalol.   # Anemia: She received 1 unit of pRBC yesterday. Hgb 8.7 this AM. Nephrology started ESA.  Dispo: Anticipated discharge to SNF today.  [name], MD 09/13/2018, 8:51 AM Pager: [phone]

## 2018-09-13 NOTE — Progress Notes (Signed)
Report given to [facility] staff. Abdominal dressing changed prior to transport and abdominal binder applied. Called report to Dianna at [facility], [phone].

## 2018-09-13 NOTE — Discharge Summary (Signed)
Name: Julie Williams MRN: [id number] DOB: 1966/08/06 53 y.o. PCP: Valinda Party, DO  Date of Admission: 08/23/2018  4:23 PM Date of Discharge: 09/13/2018 Attending Physician: Murriel Hopper MD  Discharge Diagnosis: 1. Small bowel obstruction with Perforated Colon s/p bowel resection with colostomy placemen 2. Bacteremia with fusobacterium and enterococcus complicated byHepatic abscess 3. Aspiration pneumonia requiring ventilation/ARDS 4. Acute Metabolic Encephalopathy 5. Acute Renal Failure 6. Hypernatremia 7. Acute blood loss anemia 8. Long-term opiate use 9. Hypertension      Patient Active Problem List   Diagnosis Date Noted  . Bacteremia due to Enterococcus 09/08/2018  . Colostomy in place [facility]) 09/08/2018  . Dysphagia   . Benign essential HTN   . Tachycardia   . Hypernatremia   . Leukocytosis   . Acute blood loss anemia   . Hepatic abscess   . AKI (acute kidney injury) ([place])   . History of ETT   . Stercoral ulcer of rectum 08/27/2018  . Rectal perforation s/p partial colectomy an ddiverting colostomy 08/27/2018  . Acute respiratory failure with hypoxemia ([place])   . Elevated blood pressure reading 08/16/2018  . Wheezing 04/08/2018  . Chest pain 12/14/2017  . Osteoarthritis of right acromioclavicular joint 11/10/2017  . Agitation 09/07/2017  . Vaginal atrophy 07/05/2016  . Throat discomfort 05/30/2016  . Preop cardiovascular exam 05/20/2016  . Acromioclavicular joint arthritis 12/25/2015  . Essential hypertension 07/10/2015  . Cervical spine degeneration 06/19/2015  . S/P complete repair of rotator cuff 04/13/2015  . Complete tear of right rotator cuff 01/06/2015  . S/P cervical spinal fusion 01/06/2015  . GERD (gastroesophageal reflux disease) 11/07/2014  . Hoarseness 04/08/2013  . Hearing loss 03/04/2013  . Tonsil pain 03/04/2013  . History of colonic polyps 09/13/2011  . Hypertension with goal to be determined 09/13/2011    . Tobacco use disorder 07/10/2011  . Long term (current) use of opiate analgesic 06/04/2011  . Chronic neck pain 06/04/2011  . Pain in right shoulder 06/04/2011  . Depression 07/17/2009  . Rotator cuff syndrome of right shoulder 06/29/2009    Discharge Medications:      Allergies as of 09/11/2018      Reactions   Bee Venom Swelling         Medication List    STOP taking these medications   aspirin EC 81 MG tablet   EPINEPHrine 0.3 mg/0.3 mL Soaj injection Commonly known as:  EPI-PEN   ibuprofen 200 MG tablet Commonly known as:  ADVIL,MOTRIN   naproxen sodium 220 MG tablet Commonly known as:  ALEVE   varenicline 0.5 MG tablet Commonly known as:  CHANTIX   varenicline 1 MG tablet Commonly known as:  CHANTIX     TAKE these medications   acetaminophen 160 MG/5ML solution Commonly known as:  TYLENOL Place 20.3 mLs (650 mg total) into feeding tube every 6 (six) hours as needed for fever.   albuterol 108 (90 Base) MCG/ACT inhaler Commonly known as:  PROVENTIL HFA;VENTOLIN HFA Inhale 1-2 puffs into the lungs every 6 (six) hours as needed for wheezing or shortness of breath.   amLODipine 5 MG tablet Commonly known as:  NORVASC Take 1 tablet (5 mg total) by mouth daily. Start taking on:  September 12, 2018   amoxicillin-clavulanate 500-125 MG tablet Commonly known as:  AUGMENTIN Take 1 tablet (500 mg total) by mouth every 12 (twelve) hours for 10 days.   calcium carbonate 500 MG chewable tablet Commonly known as:  TUMS - dosed in mg elemental  calcium Chew 2 tablets (400 mg of elemental calcium total) by mouth 2 (two) times daily as needed for indigestion or heartburn.   Chlorhexidine Gluconate Cloth 2 % Pads Apply 6 each topically daily. Start taking on:  September 12, 2018   diphenhydrAMINE 25 MG tablet Commonly known as:  BENADRYL Take 25 mg by mouth every 6 (six) hours as needed for itching, allergies or sleep.   guaiFENesin 100 MG/5ML  Soln Commonly known as:  ROBITUSSIN Take 30 mLs (600 mg total) by mouth every 6 (six) hours.   heparin 5000 UNIT/ML injection Inject 1 mL (5,000 Units total) into the skin every 8 (eight) hours.   labetalol 100 MG tablet Commonly known as:  NORMODYNE Take 1 tablet (100 mg total) by mouth 2 (two) times daily.   morphine 15 MG tablet Commonly known as:  MSIR Take 1 tablet (15 mg total) by mouth 3 (three) times daily for 7 days. What changed:    medication strength  how much to take  how to take this  when to take this  additional instructions   mouth rinse Liqd solution 15 mLs by Mouth Rinse route 3 (three) times daily.   omeprazole 40 MG capsule Commonly known as:  PRILOSEC Take 1 capsule (40 mg total) by mouth daily.   ondansetron 4 MG/2ML Soln injection Commonly known as:  ZOFRAN Inject 2 mLs (4 mg total) into the vein every 6 (six) hours as needed for nausea or vomiting.   polyethylene glycol powder powder Commonly known as:  GLYCOLAX/MIRALAX DISSOLVE 255 GRAMS INTO LIQUID AND TAKE BY MOUTH ONCE.   QUEtiapine 25 MG tablet Commonly known as:  SEROQUEL Take 1 tablet (25 mg total) by mouth at bedtime.   Center Take 1 Container by mouth as needed.   venlafaxine XR 37.5 MG 24 hr capsule Commonly known as:  EFFEXOR-XR Take 1 capsule (37.5 mg total) by mouth daily with breakfast.       Disposition and follow-up:   Ms.Marianny L XXXJones was discharged from [facility] in Stable condition.  At the hospital follow up visit please address:  1.  Please assess wound and ensure she received wet to dry dressings BID. Please ensure she follows up with surgery on 1/28. Please ensure patient completes Augmentin x 9 days. Will need liver US or CT abdomen 1 week after completion of antibiotics to assess for recurrence of abscess. Please ensure patient has follow up at [facility] after discharge from SNF.   2.  Labs / imaging  needed at time of follow-up: Liver Ultrasound, renal function panel, and CBC  3.  Pending labs/ test needing follow-up: Hepatic abscess culture  Follow-up Appointments:    Follow-up Information    Lininger, Juanda Bond., MD Follow up on 09/22/2018.   Specialty:  Surgery Why:  Follow-up appointment on January 28th at 10:15 AM.  Contact information: [place] [address] [phone]           Hospital Course by problem list:  1. Small bowel obstruction wth Perforated Colon s/p bowel resection with colostomy placement: Ms. Olivarez was admitted to [facility] on 12/24 with colonic perforation requiring colectomy and colostomy in the setting of opioid-induced constipation. She developed fusobacterium/enteroccoccus bacteremia which was treated with antibiotics. She also developed a hepatic abscess which was drained (culture pending). She will need to continue Augmentin for an additional 9 days and have a liver ultrasound or CT abdomen in 1 week to evaluate  for resolution. If completely resolved, please stop Augmentin. She has an appointment with the general surgeon who did the original bowel resection and colostomy placement in 2 weeks. Please ensure that she makes this appointment.   2. Bacteremia with fusobacterium and enterococcus complicated byHepatic abscess: She developed bacteremia and was treated with antibiotics. Repeat blood cultures negative. She also developed a hepatic abscess which was drained. Please follow antibiotic regiment per above. She will need a repeat liver ultrasound in 1 week.   3. Aspiration pneumonia requiring ventilation/ARDS: She was admitted to the ICU where she was intubated and treated with antibiotics. She subsequently improved and was extubated on 1/10. She did well on room air the remainder of her admission.   4. Acute Metabolic Encephalopathy: She developed acute metabolic encephalopathy which improved with restarting her  chronic opioids. Prior to discharge, she was mentating well.   5. Acute Renal Failure: She developed kidney injury likely due to acute tubular necrosis in the setting of Vanc/Zosyn use and shock. She subsequently was fluid overload and was treated with Lasix with improvement in her creatinine. Renal function was stable on discharge, though not improved. She will need close monitoring of her renal function as well as electrolytes. If acidemia worsens, nephrology recommends starting sodium bicarbonate.   6. Hypernatremia: Likely due to insensible losses from colostomy and open midline abdomen wound. Sodium improved to a normal range with D5W IVFs.  7.  Anemia: Likely secondary to acute blood loss from perforation as well as acute renal failure. She received 2 unit of packed red blood cells and Feraheme during her admission. Discharge Hb 8.7. Please recheck CBC periodically and transfuse >7.   8. Long-term opiate use: She has a long history of using opioids. Please ensure that she receives morphine 15 mg IR TID for her back. Recommend avoiding escalation of opiate regimen.   9. Hypertension: She had elevated blood pressures throughout her admission which were treated with anti-hypertensive's. Her discharge BP meds include labetalol 100 mg BID and amlodipine 5 mg daily. Please ensure that she takes these medications.    Discharge Vitals:   BP 117/81   Pulse 96   Temp 99.7 F (37.6 C)   Resp 18   Ht 5\' 5"  (1.651 m)   Wt 67.5 kg   SpO2 94%   BMI 24.76 kg/m   Pertinent Labs, Studies, and Procedures:  CBC Latest Ref Rng & Units 09/11/2018 09/10/2018 09/09/2018  WBC 4.0 - 10.5 K/uL 15.5(H) 14.0(H) 14.7(H)  Hemoglobin 12.0 - 15.0 g/dL 7.5(L) 8.4(L) 8.6(L)  Hematocrit 36.0 - 46.0 % 24.2(L) 25.6(L) 26.2(L)  Platelets 150 - 400 K/uL 490(H) 571(H) 619(H)   BMP Latest Ref Rng & Units 09/11/2018 09/10/2018 09/09/2018  Glucose 70 - 99 mg/dL 92 106(H) 105(H)  BUN 6 - 20 mg/dL 57(H) 56(H) 66(H)   Creatinine 0.44 - 1.00 mg/dL 4.23(H) 3.82(H) 4.21(H)  Sodium 135 - 145 mmol/L 143 146(H) 150(H)  Potassium 3.5 - 5.1 mmol/L 4.4 4.1 3.9  Chloride 98 - 111 mmol/L 109 111 114(H)  CO2 22 - 32 mmol/L 21(L) 23 21(L)  Calcium 8.9 - 10.3 mg/dL 8.3(L) 8.5(L) 8.5(L)    1/9 abdominal US: Decompressed gallbladder with probably artifactual thickening of the gallbladder wall measuring up to 5.5 mm. Pericholecystic fluid may be secondary to presence of ascites. Mild distension of the common bile duct measuring up to 6.3 mm. Proximal common bile duct obstruction can not be entirely excluded. Nodular liver contour with diffusely heterogeneous echogenicity, likely due to  chronic liver disease. Large hypoechoic mass in the inferior right lobe of the liver. This may represent a liver abscess or subcapsular hematoma versus a true hepatic mass. Evaluation with MRI of the abdomen with contrast, when clinically feasible may be considered.  1/8 CT Abdomen: 1. Worsening airspace consolidation and ground-glass in the lung bases, most indicative of pneumonia. Associated small bilateral pleural effusions. 2. Hepatic steatosis. Possible area of new low-attenuation in the posterior right hepatic lobe. Assessment is difficult without IV contrast. Abscess is not excluded. Sonographic evaluation to assess for hyperemia may be useful, as clinically indicated. 3. Moderate ascites, increased from 08/26/2018, with peritoneal dialysis catheter in place.  Signed: [name], MD 09/10/2018, 3:03 PM   Pager: [phone]

## 2018-09-13 NOTE — Progress Notes (Signed)
Subjective: Interval History: has some pain on deep inspir, not new  Objective: Vital signs in last 24 hours: Temp:  [98.6 F (37 C)-98.9 F (37.2 C)] 98.6 F (37 C) (01/19 0824) Pulse Rate:  [93-119] 93 (01/19 0824) Resp:  [16] 16 (01/19 0824) BP: (109-137)/(76-89) 118/78 (01/19 0824) SpO2:  [92 %-93 %] 92 % (01/19 0824) Weight:  [64.8 kg] 64.8 kg (01/19 0500) Weight change: 0.6 kg  Intake/Output from previous day: 01/18 0701 - 01/19 0700 In: 1045 [P.O.:600; I.V.:130; Blood:315] Out: 450 [Urine:50; Stool:400] Intake/Output this shift: Total I/O In: 20 [I.V.:20] Out: -   General appearance: alert, cooperative, no distress and pale Resp: clear to auscultation bilaterally Cardio: S1, S2 normal and systolic murmur: systolic ejection 2/6, crescendo and decrescendo at 2nd left intercostal space GI: dressing lower midline, ostomy L mid abdm Extremities: edema 1+  Lab Results: Recent Labs    09/12/18 0608 09/12/18 2021 09/13/18 0653  WBC 15.6*  --  16.8*  HGB 7.0* 9.1* 8.7*  HCT 23.3* 28.7* 27.0*  PLT 421*  --  443*   BMET:  Recent Labs    09/12/18 0608 09/13/18 0653  NA 143 147*  K 4.6 4.7  CL 108 111  CO2 21* 22  GLUCOSE 94 100*  BUN 61* 57*  CREATININE 4.62* 4.47*  CALCIUM 8.1* 8.3*   No results for input(s): PTH in the last 72 hours. Iron Studies: No results for input(s): IRON, TIBC, TRANSFERRIN, FERRITIN in the last 72 hours.  Studies/Results: No results found.  I have reviewed the patient's current medications.  Assessment/Plan: 1 AKI stable. Vol ok mild acidemia stable.   2 Anemia has gotten Fe, on esa , needs to cont esa 3 Colonic perf with sepsis on Augmentin, F/u surgery 4 Narcotic addiction 5 DJD P will get labs this week in office or NH, will s/o at this time   LOS: 21 days   Jeneen Rinks Theodis Kinsel 09/13/2018,8:43 AM

## 2018-09-14 LAB — AEROBIC/ANAEROBIC CULTURE W GRAM STAIN (SURGICAL/DEEP WOUND)

## 2018-09-14 LAB — AEROBIC/ANAEROBIC CULTURE (SURGICAL/DEEP WOUND)

## 2018-09-15 DIAGNOSIS — I10 Essential (primary) hypertension: Secondary | ICD-10-CM | POA: Diagnosis not present

## 2018-09-15 DIAGNOSIS — S31109D Unspecified open wound of abdominal wall, unspecified quadrant without penetration into peritoneal cavity, subsequent encounter: Secondary | ICD-10-CM | POA: Diagnosis not present

## 2018-09-15 DIAGNOSIS — D649 Anemia, unspecified: Secondary | ICD-10-CM | POA: Diagnosis not present

## 2018-09-15 DIAGNOSIS — G894 Chronic pain syndrome: Secondary | ICD-10-CM | POA: Diagnosis not present

## 2018-09-16 ENCOUNTER — Other Ambulatory Visit: Payer: Self-pay | Admitting: *Deleted

## 2018-09-16 NOTE — Patient Outreach (Signed)
[facility]) Care Management  09/16/2018  Julie Williams 08-Jan-1966 [id number]   Onsite visit to [employer].  Met with Julie Williams, discharge planner.  Julie Williams reports patient plan is to return home alone with home care, very little support system in place.  Met with patient, patient not very talkative, hard to engage.  Patinet does state she was independent prior to her admission. She states it is just her and she needs to go home because she has responsibilities to take care of, denies any support system that may offer her help.  Patient does report she is not happy with facility because they are giving her thickened liquids and "mushy food". She states she was told she was at risk of getting food in her lungs and getting an infection.   RNCM reviewed [facility] program services, including calls for transition of care. Left a packet in patient room, as patient was in therapy and RNCM met with her in quiet room near the therapy room.    Plan to follow up with [facility] UM and HTA regarding referral to [facility] LCSW for additional support and resources.  Julie Williams. Julie Purser, MSN, RN, [employer] , [place] [phone]) Business Cell  [phone]) Toll Free Office

## 2018-09-17 DIAGNOSIS — L98499 Non-pressure chronic ulcer of skin of other sites with unspecified severity: Secondary | ICD-10-CM | POA: Diagnosis not present

## 2018-09-21 ENCOUNTER — Other Ambulatory Visit: Payer: Self-pay

## 2018-09-21 DIAGNOSIS — R1084 Generalized abdominal pain: Secondary | ICD-10-CM | POA: Diagnosis not present

## 2018-09-21 DIAGNOSIS — G8929 Other chronic pain: Secondary | ICD-10-CM | POA: Diagnosis not present

## 2018-09-21 DIAGNOSIS — Z9119 Patient's noncompliance with other medical treatment and regimen: Secondary | ICD-10-CM | POA: Diagnosis not present

## 2018-09-21 NOTE — Telephone Encounter (Signed)
morphine (MSIR) 15 MG tablet(Expired)   Refill request, Pt states she is in rehab facility @ [facility].

## 2018-09-22 ENCOUNTER — Other Ambulatory Visit: Payer: Self-pay | Admitting: Internal Medicine

## 2018-09-22 HISTORY — PX: BOWEL RESECTION: SHX1257

## 2018-09-22 NOTE — Telephone Encounter (Signed)
Patient has 2 refills on file good through the end of March that I just confirmed with pharmacy.

## 2018-09-24 ENCOUNTER — Other Ambulatory Visit: Payer: Self-pay | Admitting: *Deleted

## 2018-09-24 DIAGNOSIS — M6281 Muscle weakness (generalized): Secondary | ICD-10-CM | POA: Diagnosis not present

## 2018-09-24 DIAGNOSIS — G8929 Other chronic pain: Secondary | ICD-10-CM | POA: Diagnosis not present

## 2018-09-24 DIAGNOSIS — R1084 Generalized abdominal pain: Secondary | ICD-10-CM | POA: Diagnosis not present

## 2018-09-24 DIAGNOSIS — F419 Anxiety disorder, unspecified: Secondary | ICD-10-CM | POA: Diagnosis not present

## 2018-09-24 NOTE — Patient Outreach (Signed)
[facility]) Care Management  09/24/2018  Julie Williams [birth year] [id number]  Onsite IDT meeting.  Per IDT team-Patient is having some times when she is refusing therapy sessions She has pain management issues.  She has a new ostomy.  Met with patient she was lying in the bed.  She reports she is watching staff perform ostomy care but has not participated.  Patient also reports she has a lot of pain and needs her pain medications, she was used to q4 hours and it is now q8 hours.  Patient confirms she will go home alone, but does not know when she will discharge.   Patient went to therapy at end of visit.   RNCM requested that the NP see patient today at nurses desk. The transportation scheduler reports patient refused to go to her f/u surgeon appointment yesterday.  She is insisting that they reschedule with another MD in [place].   RNCM left [facility] packet, will follow up at next facility visit.  Anticipate referral to [facility] care management upon discharge for transition of care.  Royetta Crochet. Laymond Purser, MSN, RN, [employer] , [place] [phone]) Business Cell  [phone]) Toll Free Office

## 2018-09-25 DIAGNOSIS — I10 Essential (primary) hypertension: Secondary | ICD-10-CM | POA: Diagnosis not present

## 2018-09-25 DIAGNOSIS — K56600 Partial intestinal obstruction, unspecified as to cause: Secondary | ICD-10-CM | POA: Diagnosis not present

## 2018-09-25 DIAGNOSIS — R42 Dizziness and giddiness: Secondary | ICD-10-CM | POA: Diagnosis not present

## 2018-09-25 DIAGNOSIS — R031 Nonspecific low blood-pressure reading: Secondary | ICD-10-CM | POA: Diagnosis not present

## 2018-09-28 DIAGNOSIS — E871 Hypo-osmolality and hyponatremia: Secondary | ICD-10-CM | POA: Diagnosis not present

## 2018-09-28 DIAGNOSIS — I10 Essential (primary) hypertension: Secondary | ICD-10-CM | POA: Diagnosis not present

## 2018-09-28 DIAGNOSIS — N179 Acute kidney failure, unspecified: Secondary | ICD-10-CM | POA: Diagnosis not present

## 2018-09-28 DIAGNOSIS — R031 Nonspecific low blood-pressure reading: Secondary | ICD-10-CM | POA: Diagnosis not present

## 2018-09-30 ENCOUNTER — Inpatient Hospital Stay (HOSPITAL_COMMUNITY)
Admission: EM | Admit: 2018-09-30 | Discharge: 2018-10-01 | DRG: 812 | Disposition: A | Discharge status: Home Health | Payer: PPO | Attending: Oncology | Admitting: Oncology

## 2018-09-30 ENCOUNTER — Encounter (HOSPITAL_COMMUNITY): Payer: Self-pay

## 2018-09-30 ENCOUNTER — Other Ambulatory Visit: Payer: Self-pay

## 2018-09-30 DIAGNOSIS — R1310 Dysphagia, unspecified: Secondary | ICD-10-CM | POA: Diagnosis present

## 2018-09-30 DIAGNOSIS — Z8719 Personal history of other diseases of the digestive system: Secondary | ICD-10-CM | POA: Diagnosis not present

## 2018-09-30 DIAGNOSIS — Z981 Arthrodesis status: Secondary | ICD-10-CM | POA: Diagnosis not present

## 2018-09-30 DIAGNOSIS — Z79899 Other long term (current) drug therapy: Secondary | ICD-10-CM

## 2018-09-30 DIAGNOSIS — Z8249 Family history of ischemic heart disease and other diseases of the circulatory system: Secondary | ICD-10-CM

## 2018-09-30 DIAGNOSIS — H9190 Unspecified hearing loss, unspecified ear: Secondary | ICD-10-CM | POA: Diagnosis present

## 2018-09-30 DIAGNOSIS — E871 Hypo-osmolality and hyponatremia: Secondary | ICD-10-CM | POA: Diagnosis present

## 2018-09-30 DIAGNOSIS — Z933 Colostomy status: Secondary | ICD-10-CM

## 2018-09-30 DIAGNOSIS — Z87891 Personal history of nicotine dependence: Secondary | ICD-10-CM | POA: Diagnosis not present

## 2018-09-30 DIAGNOSIS — D619 Aplastic anemia, unspecified: Secondary | ICD-10-CM | POA: Diagnosis not present

## 2018-09-30 DIAGNOSIS — R109 Unspecified abdominal pain: Secondary | ICD-10-CM | POA: Diagnosis not present

## 2018-09-30 DIAGNOSIS — M542 Cervicalgia: Secondary | ICD-10-CM | POA: Diagnosis present

## 2018-09-30 DIAGNOSIS — R0602 Shortness of breath: Secondary | ICD-10-CM | POA: Diagnosis not present

## 2018-09-30 DIAGNOSIS — R000 Tachycardia, unspecified: Secondary | ICD-10-CM | POA: Diagnosis not present

## 2018-09-30 DIAGNOSIS — D638 Anemia in other chronic diseases classified elsewhere: Secondary | ICD-10-CM | POA: Diagnosis present

## 2018-09-30 DIAGNOSIS — Z800 Family history of malignant neoplasm of digestive organs: Secondary | ICD-10-CM | POA: Diagnosis not present

## 2018-09-30 DIAGNOSIS — D649 Anemia, unspecified: Secondary | ICD-10-CM | POA: Diagnosis present

## 2018-09-30 DIAGNOSIS — R509 Fever, unspecified: Secondary | ICD-10-CM | POA: Diagnosis not present

## 2018-09-30 DIAGNOSIS — K219 Gastro-esophageal reflux disease without esophagitis: Secondary | ICD-10-CM | POA: Diagnosis present

## 2018-09-30 DIAGNOSIS — D62 Acute posthemorrhagic anemia: Principal | ICD-10-CM | POA: Diagnosis present

## 2018-09-30 DIAGNOSIS — Z9049 Acquired absence of other specified parts of digestive tract: Secondary | ICD-10-CM | POA: Diagnosis not present

## 2018-09-30 DIAGNOSIS — F339 Major depressive disorder, recurrent, unspecified: Secondary | ICD-10-CM | POA: Diagnosis not present

## 2018-09-30 DIAGNOSIS — F329 Major depressive disorder, single episode, unspecified: Secondary | ICD-10-CM | POA: Diagnosis present

## 2018-09-30 DIAGNOSIS — D696 Thrombocytopenia, unspecified: Secondary | ICD-10-CM | POA: Diagnosis not present

## 2018-09-30 DIAGNOSIS — G8929 Other chronic pain: Secondary | ICD-10-CM | POA: Diagnosis present

## 2018-09-30 DIAGNOSIS — Z9889 Other specified postprocedural states: Secondary | ICD-10-CM | POA: Diagnosis not present

## 2018-09-30 DIAGNOSIS — R52 Pain, unspecified: Secondary | ICD-10-CM | POA: Diagnosis not present

## 2018-09-30 DIAGNOSIS — Z91030 Bee allergy status: Secondary | ICD-10-CM

## 2018-09-30 DIAGNOSIS — I10 Essential (primary) hypertension: Secondary | ICD-10-CM | POA: Diagnosis present

## 2018-09-30 DIAGNOSIS — R5381 Other malaise: Secondary | ICD-10-CM | POA: Diagnosis not present

## 2018-09-30 HISTORY — DX: Anemia, unspecified: D64.9

## 2018-09-30 LAB — CBC WITH DIFFERENTIAL/PLATELET
Abs Immature Granulocytes: 0.37 10*3/uL — ABNORMAL HIGH (ref 0.00–0.07)
Basophils Absolute: 0 10*3/uL (ref 0.0–0.1)
Basophils Relative: 0 %
Eosinophils Absolute: 0.1 10*3/uL (ref 0.0–0.5)
Eosinophils Relative: 1 %
HCT: 19.6 % — ABNORMAL LOW (ref 36.0–46.0)
Hemoglobin: 6 g/dL — CL (ref 12.0–15.0)
Immature Granulocytes: 3 %
Lymphocytes Relative: 26 %
Lymphs Abs: 3.1 10*3/uL (ref 0.7–4.0)
MCH: 28.7 pg (ref 26.0–34.0)
MCHC: 30.6 g/dL (ref 30.0–36.0)
MCV: 93.8 fL (ref 80.0–100.0)
Monocytes Absolute: 0.7 10*3/uL (ref 0.1–1.0)
Monocytes Relative: 6 %
NRBC: 0 % (ref 0.0–0.2)
Neutro Abs: 7.8 10*3/uL — ABNORMAL HIGH (ref 1.7–7.7)
Neutrophils Relative %: 64 %
Platelets: 509 10*3/uL — ABNORMAL HIGH (ref 150–400)
RBC: 2.09 MIL/uL — ABNORMAL LOW (ref 3.87–5.11)
RDW: 15.2 % (ref 11.5–15.5)
WBC: 12.1 10*3/uL — ABNORMAL HIGH (ref 4.0–10.5)

## 2018-09-30 LAB — BASIC METABOLIC PANEL
Anion gap: 7 (ref 5–15)
BUN: 17 mg/dL (ref 6–20)
CO2: 23 mmol/L (ref 22–32)
Calcium: 8.3 mg/dL — ABNORMAL LOW (ref 8.9–10.3)
Chloride: 103 mmol/L (ref 98–111)
Creatinine, Ser: 1.09 mg/dL — ABNORMAL HIGH (ref 0.44–1.00)
GFR calc Af Amer: >60 mL/min (ref >60)
GFR calc non Af Amer: 58 mL/min — ABNORMAL LOW (ref >60)
Glucose, Bld: 103 mg/dL — ABNORMAL HIGH (ref 70–99)
Potassium: 4 mmol/L (ref 3.5–5.1)
Sodium: 133 mmol/L — ABNORMAL LOW (ref 135–145)

## 2018-09-30 LAB — URINALYSIS, ROUTINE W REFLEX MICROSCOPIC
Bilirubin Urine: NEGATIVE
Glucose, UA: NEGATIVE mg/dL
Hgb urine dipstick: NEGATIVE
Ketones, ur: NEGATIVE mg/dL
Leukocytes, UA: NEGATIVE
Nitrite: NEGATIVE
Protein, ur: NEGATIVE mg/dL
Specific Gravity, Urine: 1.003 — ABNORMAL LOW (ref 1.005–1.030)
pH: 6 (ref 5.0–8.0)

## 2018-09-30 LAB — IRON AND TIBC
Iron: 14 ug/dL — ABNORMAL LOW (ref 28–170)
Saturation Ratios: 8 % — ABNORMAL LOW (ref 10.4–31.8)
TIBC: 167 ug/dL — ABNORMAL LOW (ref 250–450)
UIBC: 153 ug/dL

## 2018-09-30 LAB — RETICULOCYTES
Immature Retic Fract: 30.6 % — ABNORMAL HIGH (ref 2.3–15.9)
RBC.: 2.09 MIL/uL — ABNORMAL LOW (ref 3.87–5.11)
Retic Count, Absolute: 79.4 10*3/uL (ref 19.0–186.0)
Retic Ct Pct: 3.8 % — ABNORMAL HIGH (ref 0.4–3.1)

## 2018-09-30 LAB — FERRITIN: Ferritin: 2392 ng/mL — ABNORMAL HIGH (ref 11–307)

## 2018-09-30 LAB — FOLATE: Folate: 11.7 ng/mL (ref >5.9)

## 2018-09-30 LAB — PREPARE RBC (CROSSMATCH)

## 2018-09-30 LAB — POC OCCULT BLOOD, ED: Fecal Occult Bld: NEGATIVE

## 2018-09-30 LAB — VITAMIN B12: Vitamin B-12: 530 pg/mL (ref 180–914)

## 2018-09-30 MED ORDER — SODIUM CHLORIDE 0.9 % IV SOLN
10.0000 mL/h | Freq: Once | INTRAVENOUS | Status: AC
  Administered 2018-10-01: 10 mL/h via INTRAVENOUS

## 2018-09-30 NOTE — ED Triage Notes (Addendum)
Pt received report from MD that hgb was 6.4 Recent bowel resection with colostomy on 09/22/2018. No blood noted in colostomy bag. Here for further work up. VSS on EMS arrival.
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
## 2018-10-01 ENCOUNTER — Other Ambulatory Visit: Payer: Self-pay | Admitting: *Deleted

## 2018-10-01 ENCOUNTER — Inpatient Hospital Stay (HOSPITAL_COMMUNITY): Payer: PPO

## 2018-10-01 ENCOUNTER — Encounter (HOSPITAL_COMMUNITY): Payer: Self-pay | Admitting: General Practice

## 2018-10-01 DIAGNOSIS — Z8719 Personal history of other diseases of the digestive system: Secondary | ICD-10-CM

## 2018-10-01 DIAGNOSIS — Z981 Arthrodesis status: Secondary | ICD-10-CM | POA: Diagnosis not present

## 2018-10-01 DIAGNOSIS — Z87448 Personal history of other diseases of urinary system: Secondary | ICD-10-CM

## 2018-10-01 DIAGNOSIS — D619 Aplastic anemia, unspecified: Secondary | ICD-10-CM | POA: Diagnosis not present

## 2018-10-01 DIAGNOSIS — Z9049 Acquired absence of other specified parts of digestive tract: Secondary | ICD-10-CM

## 2018-10-01 DIAGNOSIS — Z91030 Bee allergy status: Secondary | ICD-10-CM

## 2018-10-01 DIAGNOSIS — I10 Essential (primary) hypertension: Secondary | ICD-10-CM | POA: Diagnosis present

## 2018-10-01 DIAGNOSIS — R109 Unspecified abdominal pain: Secondary | ICD-10-CM | POA: Diagnosis not present

## 2018-10-01 DIAGNOSIS — Z8701 Personal history of pneumonia (recurrent): Secondary | ICD-10-CM

## 2018-10-01 DIAGNOSIS — K219 Gastro-esophageal reflux disease without esophagitis: Secondary | ICD-10-CM | POA: Diagnosis present

## 2018-10-01 DIAGNOSIS — D638 Anemia in other chronic diseases classified elsewhere: Secondary | ICD-10-CM

## 2018-10-01 DIAGNOSIS — D649 Anemia, unspecified: Secondary | ICD-10-CM | POA: Diagnosis present

## 2018-10-01 DIAGNOSIS — M542 Cervicalgia: Secondary | ICD-10-CM

## 2018-10-01 DIAGNOSIS — D696 Thrombocytopenia, unspecified: Secondary | ICD-10-CM | POA: Diagnosis not present

## 2018-10-01 DIAGNOSIS — Z9889 Other specified postprocedural states: Secondary | ICD-10-CM

## 2018-10-01 DIAGNOSIS — Z79891 Long term (current) use of opiate analgesic: Secondary | ICD-10-CM

## 2018-10-01 DIAGNOSIS — H9190 Unspecified hearing loss, unspecified ear: Secondary | ICD-10-CM | POA: Diagnosis present

## 2018-10-01 DIAGNOSIS — Z87891 Personal history of nicotine dependence: Secondary | ICD-10-CM

## 2018-10-01 DIAGNOSIS — Z79899 Other long term (current) drug therapy: Secondary | ICD-10-CM

## 2018-10-01 DIAGNOSIS — G8929 Other chronic pain: Secondary | ICD-10-CM

## 2018-10-01 DIAGNOSIS — F329 Major depressive disorder, single episode, unspecified: Secondary | ICD-10-CM | POA: Diagnosis present

## 2018-10-01 DIAGNOSIS — E871 Hypo-osmolality and hyponatremia: Secondary | ICD-10-CM | POA: Diagnosis present

## 2018-10-01 DIAGNOSIS — Z933 Colostomy status: Secondary | ICD-10-CM | POA: Diagnosis not present

## 2018-10-01 DIAGNOSIS — Z8249 Family history of ischemic heart disease and other diseases of the circulatory system: Secondary | ICD-10-CM | POA: Diagnosis not present

## 2018-10-01 DIAGNOSIS — F339 Major depressive disorder, recurrent, unspecified: Secondary | ICD-10-CM

## 2018-10-01 DIAGNOSIS — Z800 Family history of malignant neoplasm of digestive organs: Secondary | ICD-10-CM | POA: Diagnosis not present

## 2018-10-01 DIAGNOSIS — Z8619 Personal history of other infectious and parasitic diseases: Secondary | ICD-10-CM

## 2018-10-01 DIAGNOSIS — D62 Acute posthemorrhagic anemia: Principal | ICD-10-CM

## 2018-10-01 DIAGNOSIS — Z8709 Personal history of other diseases of the respiratory system: Secondary | ICD-10-CM

## 2018-10-01 DIAGNOSIS — R1310 Dysphagia, unspecified: Secondary | ICD-10-CM | POA: Diagnosis present

## 2018-10-01 HISTORY — DX: Anemia, unspecified: D64.9

## 2018-10-01 LAB — BASIC METABOLIC PANEL
Anion gap: 12 (ref 5–15)
BUN: 14 mg/dL (ref 6–20)
CO2: 19 mmol/L — ABNORMAL LOW (ref 22–32)
Calcium: 8.2 mg/dL — ABNORMAL LOW (ref 8.9–10.3)
Chloride: 103 mmol/L (ref 98–111)
Creatinine, Ser: 0.97 mg/dL (ref 0.44–1.00)
GFR calc non Af Amer: >60 mL/min (ref >60)
Glucose, Bld: 100 mg/dL — ABNORMAL HIGH (ref 70–99)
Potassium: 3.6 mmol/L (ref 3.5–5.1)
Sodium: 134 mmol/L — ABNORMAL LOW (ref 135–145)

## 2018-10-01 LAB — CBC
HCT: 22.9 % — ABNORMAL LOW (ref 36.0–46.0)
Hemoglobin: 7.5 g/dL — ABNORMAL LOW (ref 12.0–15.0)
MCH: 30.4 pg (ref 26.0–34.0)
MCHC: 32.8 g/dL (ref 30.0–36.0)
MCV: 92.7 fL (ref 80.0–100.0)
Platelets: 439 10*3/uL — ABNORMAL HIGH (ref 150–400)
RBC: 2.47 MIL/uL — ABNORMAL LOW (ref 3.87–5.11)
RDW: 15.2 % (ref 11.5–15.5)
WBC: 10.5 10*3/uL (ref 4.0–10.5)
nRBC: 0 % (ref 0.0–0.2)

## 2018-10-01 LAB — HEPATIC FUNCTION PANEL
ALT: 10 U/L (ref 0–44)
AST: 14 U/L — ABNORMAL LOW (ref 15–41)
Albumin: 1.8 g/dL — ABNORMAL LOW (ref 3.5–5.0)
Alkaline Phosphatase: 59 U/L (ref 38–126)
BILIRUBIN INDIRECT: 0.4 mg/dL (ref 0.3–0.9)
Bilirubin, Direct: 0.2 mg/dL (ref 0.0–0.2)
Total Bilirubin: 0.6 mg/dL (ref 0.3–1.2)
Total Protein: 6.6 g/dL (ref 6.5–8.1)

## 2018-10-01 LAB — SURGICAL PCR SCREEN
MRSA, PCR: NEGATIVE
STAPHYLOCOCCUS AUREUS: NEGATIVE

## 2018-10-01 LAB — HEMOGLOBIN AND HEMATOCRIT, BLOOD
HCT: 29.7 % — ABNORMAL LOW (ref 36.0–46.0)
Hemoglobin: 9.4 g/dL — ABNORMAL LOW (ref 12.0–15.0)

## 2018-10-01 LAB — PREPARE RBC (CROSSMATCH)

## 2018-10-01 MED ORDER — ACETAMINOPHEN 325 MG PO TABS
650.0000 mg | ORAL_TABLET | Freq: Once | ORAL | Status: AC
  Administered 2018-10-01: 650 mg via ORAL
  Filled 2018-10-01: qty 2

## 2018-10-01 MED ORDER — SODIUM CHLORIDE 0.9 % IV SOLN: INTRAVENOUS | Status: DC

## 2018-10-01 MED ORDER — SENNOSIDES-DOCUSATE SODIUM 8.6-50 MG PO TABS: 1.0000 | ORAL_TABLET | Freq: Every evening | ORAL | Status: DC | PRN

## 2018-10-01 MED ORDER — SODIUM CHLORIDE 0.9% IV SOLUTION
Freq: Once | INTRAVENOUS | Status: AC
  Administered 2018-10-01: 03:00:00 via INTRAVENOUS

## 2018-10-01 MED ORDER — ACETAMINOPHEN 325 MG PO TABS
650.0000 mg | ORAL_TABLET | Freq: Four times a day (QID) | ORAL | Status: DC | PRN
  Filled 2018-10-01: qty 2

## 2018-10-01 MED ORDER — VENLAFAXINE HCL ER 37.5 MG PO CP24
37.5000 mg | ORAL_CAPSULE | Freq: Every day | ORAL | Status: DC
  Administered 2018-10-01: 37.5 mg via ORAL
  Filled 2018-10-01: qty 1

## 2018-10-01 MED ORDER — MORPHINE SULFATE 15 MG PO TABS
15.0000 mg | ORAL_TABLET | Freq: Four times a day (QID) | ORAL | Status: DC | PRN
  Administered 2018-10-01 (×2): 15 mg via ORAL
  Filled 2018-10-01 (×2): qty 1

## 2018-10-01 MED ORDER — ACETAMINOPHEN 650 MG RE SUPP: 650.0000 mg | Freq: Four times a day (QID) | RECTAL | Status: DC | PRN

## 2018-10-01 MED ORDER — LACTATED RINGERS IV SOLN
INTRAVENOUS | Status: AC
  Administered 2018-10-01: 07:00:00 via INTRAVENOUS

## 2018-10-01 MED ORDER — PANTOPRAZOLE SODIUM 40 MG PO TBEC: 40.0000 mg | DELAYED_RELEASE_TABLET | Freq: Every day | ORAL | Status: DC

## 2018-10-01 MED ORDER — ENSURE ENLIVE PO LIQD
237.0000 mL | Freq: Two times a day (BID) | ORAL | Status: DC
  Administered 2018-10-01 (×2): 237 mL via ORAL

## 2018-10-01 NOTE — Patient Outreach (Signed)
[facility]) Care Management  10/01/2018  Julie Williams May 02, 1966 [id number]   Onsite visit to facility.  Patient was out of the facility getting blood transfusion.  Per therapy she is participating more with therapy.  Nursing staff is working with ostomy care.  Anticipate discharge soon.   Plan to continue to monitor for [facility] CM needs upon discharge and collaborate with [facility]. Royetta Crochet. Laymond Purser, MSN, RN, [employer] , [place] [phone]) Business Cell  [phone]) Toll Free Office

## 2018-10-01 NOTE — Clinical Social Work Note (Signed)
Clinical Social Work Assessment  Patient Details  Name: Julie Williams MRN: [id number] Date of Birth: [birth year]  Date of referral:  10/01/18               Reason for consult:  Discharge Planning                Permission sought to share information with:  Facility Sport and exercise psychologist, Family Supports Permission granted to share information::  No   Housing/Transportation Living arrangements for the past 2 months:  [place], [place] of Information:  Patient Patient Interpreter Needed:  None Criminal Activity/Legal Involvement Pertinent to Current Situation/Hospitalization:  No - Comment as needed Significant Relationships:  Pets, Neighbor Lives with:  Self, Pets Do you feel safe going back to the place where you live?  Yes Need for family participation in patient care:  Yes (Comment)  Care giving concerns:  Pt from home alone, she has a cat and dog and chickens. Pt recently was discharged in January to SNF after extensive hospital stay. She states that she feels she can safely discharge home from hospital. She does lack significant support at home but was unwilling to discuss supports extensively with CSW.   Social Worker assessment / plan:  CSW met with pt at bedside. Introduced self, role, and reason for visit. Pt from [facility], she was discharged there in January. She feels she has gotten stronger and done well with therapies there and would like to discharge home. She states she is tired of being at the SNF. She previously was living at home with her cat, dog, and chickens. She does not have much support that she is willing to divulge.   CSW discussed that I would follow for PT/OT recommendations prior to discussing SNF vs. Home.  Employment status:  Unemployed, Disabled (Comment on whether or not currently receiving Disability) Insurance information:  Managed Medicare PT Recommendations:  Not assessed at this time Information /  Referral to community resources:  [place]  Patient/Family's Response to care:  Pt amenable to CSW visit, states she wants to go home at discharge and not back to SNF.  Patient/Family's Understanding of and Emotional Response to Diagnosis, Current Treatment, and Prognosis:  Pt states understanding of diagnosis, current treatment and prognosis. Pt feels she is strong and will do well returning home. Pt eager to see her pets. Pt flat but appropriate throughout assessment.  Emotional Assessment Appearance:  Appears stated age Attitude/Demeanor/Rapport:  (Quiet; Withdrawn) Affect (typically observed):  Flat, Guarded, Quiet Orientation:  Oriented to Self, Oriented to Place, Oriented to  Time, Oriented to Situation Alcohol / Substance use:  Not Applicable Psych involvement (Current and /or in the community):  No (Comment)  Discharge Needs  Concerns to be addressed:  Care Coordination, Discharge Planning Concerns Readmission within the last 30 days:  Yes Current discharge risk:  Lives alone, Physical Impairment Barriers to Discharge:  Ship broker, Continued Medical Work up   [employer], [place] 10/01/2018, 10:30 AM

## 2018-10-01 NOTE — Patient Outreach (Signed)
[facility]) Care Management  10/01/2018  Julie Williams August 26, 1966 [id number]   Call from Andrew Au at [facility].  She reports that patient has elected to return home from [facility] versus return to [facility].  She reports that patient has enrolled in [place] secure start program for ostomy and has been  Seen by [facility] nurse at the hospital. She has some initial supplies to get started.  Also, hospital case manager will set up home care services and transportation for patient to return home.   Call to patient in her room 6N4.  Spoke with her about [facility] care management follow up, she agrees to transition of care calls.  She has not picked up her belongings at facility and does not want this RNCM to contact them as of yet until she can get by there.  Plan to place referral for [facility] community for transition of care. Verbal consent obtained.   Royetta Crochet. Laymond Purser, MSN, RN, [employer] , [place] [phone]) Business Cell  [phone]) Toll Free Office

## 2018-10-01 NOTE — Progress Notes (Signed)
   Subjective: Feels much better this morning after receiving 2U pRBCs. Abdominal pain, sob have resolved. She is walking to the bathroom with out lightheadedness or dizziness. She is asking to go home. Discussed plan for PT/OT to evaluate and recommend disposition.   Objective:  Vital signs in last 24 hours: Vitals:   10/01/18 0145 10/01/18 0307 10/01/18 0348 10/01/18 0710  BP: 114/79 101/79 109/89 (!) 123/91  Pulse: 90 85 86 (!) 101  Resp: (!) 21 18 18 19   Temp: 99.3 F (37.4 C) 99 F (37.2 C) 98.7 F (37.1 C) 98.7 F (37.1 C)  TempSrc: Oral Oral Oral Oral  SpO2: 97% 94% 97% 96%  Weight:      Height:       Gen: sitting up in bed, well appearing Pulm: CTAB Cardiac: RRR, no m/r/g Abd: soft, ND, NT, colostomy with good output, no surrounding erythema or tenderness   Assessment/Plan:  Active Problems:   Colostomy present (HCC)   Anemia due to chronic illness   Ms. Wynetta Emery is a 53 year old Caucasian woman with history of rectal perforation, possibly related to ulcer, s/p bowel resection with colostomy, recent ICU admission for ARDS, Acute renal failure, acute metabolic encephalopathy, hepatic abscess, fusobacterium/enterococcus bacteremia, acute blood loss anemia s/p 2U PRBC and Feraheme here with symptomatic anemia   Symptomatic Anemia: Hgb 6.0 on admission. Resolved after 2U pRBCs. Most likely etiology is decreased production of RBCs given low retic count for her degree of anemia. Doubt iron def since ferritin is elevated and she recently received IV iron during last hospitalization. LFTs are normal so doubt continued liver abscess or hepatic iron deposition.  - s/p 2U pRBCs with resolution of symptoms - hgb now 9.4   Colostomy: h/o rectal perf s/p bowel resection.  - good output from bag, tolerating po  - c/s ostomy care nurse for home care instructions  Dispo: Anticipated discharge today pending PT/OT eval. If DC home, will need supplies for colostomy bag.   Isabelle Course, MD 10/01/2018, 11:16 AM Pager: [phone]

## 2018-10-01 NOTE — Progress Notes (Signed)
Pt arrived to floor A&Ox4. Able to communicate wants and needs. No complaint of pain. No signs of distress noted.

## 2018-10-01 NOTE — ED Notes (Signed)
Patient transported to X-ray 

## 2018-10-01 NOTE — Progress Notes (Signed)
10/01/18 1212  PT Visit Information  Last PT Received On 10/01/18  Assistance Needed +1  History of Present Illness Patient is a 53 y/o female admitted to [facility]ital due to a decrease in hemoglobin following discharge from a complicated hospitalization. Patient with acute blood loss anemia s/p 2U PRBC. PMH of HTN, depression, rectal perforation, possibly related to ulcer, s/p bowel resection with colostomy, recent ICU admission for ARDS, Acute renal failure, acute metabolic encephalopathy, hepatic abscess, and fusobacterium/enterococcus bacteremia.    Precautions  Precautions Fall;Other (comment) (colostomy)  Restrictions  Weight Bearing Restrictions No  Home Living  Family/patient expects to be discharged to: Private residence  Living Arrangements Alone  Available Help at Discharge Family;Friend(s);Available PRN/intermittently  Type of Home Mobile home  Home Access Stairs to enter  Entrance Stairs-Number of Steps 1  Entrance Stairs-Rails Right  Home Layout One level  Bathroom Shower/Tub Tub/shower unit  Automotive engineer None  Additional Comments Pt reports she lives alone with her 26 chickens, dog and cat.  She reports she has a son in [place], who has not visited   Prior Function  Level of Independence Needs assistance  Gait / Transfers Assistance Needed Working with PT on ambulation with RW at [facility]. Used wheel chair when not with staff  ADL's / Homemaking Assistance Needed Reports independent with ADLs  Communication  Communication No difficulties  Pain Assessment  Pain Assessment 0-10  Pain Score 9  Pain Location abdomen  Pain Descriptors / Indicators Grimacing;Guarding;Discomfort  Pain Intervention(s) Limited activity within patient's tolerance;Monitored during session;RN gave pain meds during session  Cognition  Arousal/Alertness Awake/alert  Behavior During Therapy [facility] for tasks assessed/performed  Overall Cognitive Status No family/caregiver  present to determine baseline cognitive functioning  Following Commands Follows one step commands consistently  General Comments Patient A&Ox4. Appropriately responded and followed cues and commands.   Upper Extremity Assessment  Upper Extremity Assessment Defer to OT evaluation  Lower Extremity Assessment  Lower Extremity Assessment Generalized weakness  Cervical / Trunk Assessment  Cervical / Trunk Assessment Normal  Bed Mobility  Overal bed mobility Needs Assistance  Bed Mobility Supine to Sit;Sit to Supine  Supine to sit Supervision  Sit to supine Supervision  General bed mobility comments Patient required supervision for all bed mobility for safety.  Transfers  Overall transfer level Needs assistance  Equipment used Rolling walker (2 wheeled)  Transfers Sit to/from Stand  Sit to Stand Supervision  General transfer comment Patient required supervision to stand with RW. Verbal cues to push up from bed instead of RW. Denies dizziness upon standing  Ambulation/Gait  Ambulation/Gait assistance Supervision  Gait Distance (Feet) 250 Feet  Assistive device Rolling walker (2 wheeled)  Gait Pattern/deviations Step-through pattern;Decreased step length - right;Decreased step length - left  General Gait Details Patient ambulated with RW and supervision for safety with no LOB. Required verbal cues for proximity to RW during ambulation. Verbal cues to not pick up RW while ambulating.  Gait velocity WFL  Balance  Overall balance assessment Needs assistance  Sitting-balance support Feet supported;No upper extremity supported  Sitting balance-Leahy Scale Good  Sitting balance - Comments No LOB in sitting  Standing balance support Bilateral upper extremity supported  Standing balance-Leahy Scale Poor  Standing balance comment reliant on BUE support and RW to maintain standing balance  General Comments  General comments (skin integrity, edema, etc.) Patient requesting to go home following  discharge from [place]  During Treatment Gait belt  Activity Tolerance Patient tolerated treatment well  Patient left in bed;with call bell/phone within reach  Nurse Communication Mobility status  PT Assessment  PT Recommendation/Assessment Patient needs continued PT services  PT Visit Diagnosis Unsteadiness on feet (R26.81);Muscle weakness (generalized) (M62.81);Difficulty in walking, not elsewhere classified (R26.2)  PT Problem List Decreased strength;Decreased range of motion;Decreased activity tolerance;Decreased balance;Decreased mobility;Decreased knowledge of use of DME;Pain;Decreased knowledge of precautions  Barriers to Discharge Decreased caregiver support  PT Plan  PT Frequency (ACUTE ONLY) Min 3X/week  PT Treatment/Interventions (ACUTE ONLY) DME instruction;Gait training;Stair training;Functional mobility training;Therapeutic activities;Therapeutic exercise;Balance training;Patient/family education  AM-PAC PT "6 Clicks" Mobility Outcome Measure (Version 2)  Help needed turning from your back to your side while in a flat bed without using bedrails? 4  Help needed moving from lying on your back to sitting on the side of a flat bed without using bedrails? 4  Help needed moving to and from a bed to a chair (including a wheelchair)? 4  Help needed standing up from a chair using your arms (e.g., wheelchair or bedside chair)? 4  Help needed to walk in hospital room? 4  Help needed climbing 3-5 steps with a railing?  3  6 Click Score 23  Consider Recommendation of Discharge To: Home with no services  PT Recommendation  Follow Up Recommendations Home health PT  PT equipment Rolling walker with 5" wheels  Individuals Consulted  Consulted and Agree with Results and Recommendations Patient  Acute Rehab PT Goals  Patient Stated Goal go home  PT Goal Formulation With patient  Time For Goal Achievement 10/15/18  Potential to Achieve Goals Good   PT Time Calculation  PT Start Time (ACUTE ONLY) 1143  PT Stop Time (ACUTE ONLY) 1203  PT Time Calculation (min) (ACUTE ONLY) 20 min  PT General Charges  $$ ACUTE PT VISIT 1 Visit  PT Evaluation  $PT Eval Low Complexity 1 Low   Patient admitted to hospital secondary to problems above and with deficits below. Patient required supervision for ambulation with RW with no LOB. Patient requesting to go home following discharge stating she feels comfortable completing all necessary tasks at home. Patient reports she can have intermittent help as needed at home. Recommending HHPT to optimize balance, strength, and safety with functional mobility. Patient will benefit from acute physical therapy to maximize independence and safety with functional mobility.   Erick Blinks, SPT

## 2018-10-01 NOTE — ED Notes (Signed)
Pt reports momentary flushed, "sweaty" feeling. Re-check oral temp. Now 100F as recorded. Denies new onset pain or incr'd WOB. Discussed with Rancour, MD. Will continue transfusion.

## 2018-10-01 NOTE — ED Notes (Signed)
Admitting Providers at bedside. 

## 2018-10-01 NOTE — Discharge Summary (Signed)
Name: Julie Williams MRN: [id number] DOB: 1966/08/19 53 y.o. PCP: Valinda Party, DO  Date of Admission: 09/30/2018  9:17 PM Date of Discharge: 10/01/2018 Attending Physician: No att. providers found  Discharge Diagnosis: 1. Symptomatic anemia 2. GERD 3. Postprandial abdominal pain 4. Major depressive disorder 5. Hx of hepatic abscess s/p perihepatic abscess aspiration 6. Hx of acute renal failure 7. Hx of rectal perforation s/p bowel resection with colostomy  Discharge Medications: Allergies as of 10/01/2018      Reactions   Bee Venom Swelling      Medication List    TAKE these medications   acetaminophen 160 MG/5ML solution Commonly known as:  TYLENOL Place 20.3 mLs (650 mg total) into feeding tube every 6 (six) hours as needed for fever.   albuterol 108 (90 Base) MCG/ACT inhaler Commonly known as:  PROVENTIL HFA;VENTOLIN HFA Inhale 1-2 puffs into the lungs every 6 (six) hours as needed for wheezing or shortness of breath.   amLODipine 5 MG tablet Commonly known as:  NORVASC Take 1 tablet (5 mg total) by mouth daily.   calcium carbonate 500 MG chewable tablet Commonly known as:  TUMS - dosed in mg elemental calcium Chew 2 tablets (400 mg of elemental calcium total) by mouth 2 (two) times daily as needed for indigestion or heartburn.   Chlorhexidine Gluconate Cloth 2 % Pads Apply 6 each topically daily.   diphenhydrAMINE 25 MG tablet Commonly known as:  BENADRYL Take 25 mg by mouth every 6 (six) hours as needed for itching, allergies or sleep.   guaiFENesin 100 MG/5ML Soln Commonly known as:  ROBITUSSIN Take 30 mLs (600 mg total) by mouth every 6 (six) hours.   heparin 5000 UNIT/ML injection Inject 1 mL (5,000 Units total) into the skin every 8 (eight) hours.   labetalol 100 MG tablet Commonly known as:  NORMODYNE Take 1 tablet (100 mg total) by mouth 2 (two) times daily.   morphine 15 MG tablet Commonly known as:  MSIR Take 1 tablet (15 mg  total) by mouth 3 (three) times daily for 7 days.   mouth rinse Liqd solution 15 mLs by Mouth Rinse route 3 (three) times daily.   omeprazole 40 MG capsule Commonly known as:  PRILOSEC Take 1 capsule (40 mg total) by mouth daily.   ondansetron 4 MG/2ML Soln injection Commonly known as:  ZOFRAN Inject 2 mLs (4 mg total) into the vein every 6 (six) hours as needed for nausea or vomiting.   polyethylene glycol powder powder Commonly known as:  GLYCOLAX/MIRALAX DISSOLVE 255 GRAMS INTO LIQUID AND TAKE BY MOUTH ONCE.   QUEtiapine 25 MG tablet Commonly known as:  SEROQUEL Take 1 tablet (25 mg total) by mouth at bedtime.   [place] Take 1 Container by mouth as needed.   venlafaxine XR 37.5 MG 24 hr capsule Commonly known as:  EFFEXOR-XR Take 1 capsule (37.5 mg total) by mouth daily with breakfast.            Durable Medical Equipment  (From admission, onward)         Start     Ordered   10/01/18 0000  For home use only DME 3 n 1     10/01/18 1335          Disposition and follow-up:   Julie Williams was discharged from [facility] in stable condition.  At the hospital follow up visit please address:  1.  Patient's chronic anemia  2.  Labs / imaging needed at time of follow-up: CBC  3.  Pending labs/ test needing follow-up: none  Follow-up Appointments: [place] Follow up.   Contact information: [address] [place] [address] [phone]         Follow up with PCP Dr. Lydia Guiles Course by problem list: Patient is a 53 year old caucasian female with recent admission from 08/23/18 to 09/13/2018 for diverticulosis complicated by rectal perforation s/p colostomy, bacteremia, ARDS, aspiration pneumonia, acute blood loss anemia who presented to the ED from her rehab facility with SOB and progressive dyspnea on exertion. Initial blood work pertinent for  hemoglobin 6.0  #Symptomatic Anemia 2/2 Hypoproliferation  On ED admission she was found to be tachycardic in 110s, febrile with a temp of 100.4 and tachypnec with RR of 22 along with a hemoglobin of 6. Patient was typed and received 2 units of PRBC along with IV Lactated ringers for fluid replacement. Patient was worked up for any signs of acute blood loss and work up was found to be negative. During previous hospitalization she received blood and iron transfusions. Likely etiology of her current anemia is decreased proliferation as her reticulocyte index was low for her level of anemia. Iron panel showed elevated ferritin. Symptomatic anemia resolved after 2U pRBCs.  Hemoglobin at discharge was 9.   #GERD: Patient was given Protonix during her hospitalization. Patient can continue home medication of omeprazole upon discharge.  #Major Depressive disorder: Patient continued her home medication of Venlafaxine.  #Hypertension: Patients hypertensive medication was put on hold due to her symptomatic anemia and low to normal BP on admission. Patient can continue home hypertensive medication upon discharge.  #Post prandial abdominal pain: Patient was started on Senna and morphine for pain control. Currently patient does not complain of any abdominal pain.   No acute intervention was done for the following chronic problems: #Hx of hepatic abscess s/p perihepatic abscess aspiration #Hx of acute renal failure #Hx of rectal perforation s/p bowel resection with colostomy     Discharge Vitals:   BP 115/84 (BP Location: Left Arm)   Pulse 96   Temp 98.6 F (37 C) (Oral)   Resp 17   Ht 5\' 5"  (1.651 m)   Wt 64 kg   SpO2 99%   BMI 23.48 kg/m   Pertinent Labs, Studies, and Procedures:   CBC Latest Ref Rng & Units 10/05/2018 10/01/2018 10/01/2018  WBC 4.0 - 10.5 K/uL 16.1(H) - 10.5  Hemoglobin 12.0 - 15.0 g/dL 9.9(L) 9.4(L) 7.5(L)  Hematocrit 36.0 - 46.0 % 32.5(L) 29.7(L) 22.9(L)  Platelets 150 -  400 K/uL 530(H) - 439(H)   CMP Latest Ref Rng & Units 10/05/2018 10/01/2018 09/30/2018  Glucose 70 - 99 mg/dL 149(H) 100(H) 103(H)  BUN 6 - 20 mg/dL 9 14 17   Creatinine 0.44 - 1.00 mg/dL 1.02(H) 0.97 1.09(H)  Sodium 135 - 145 mmol/L 135 134(L) 133(L)  Potassium 3.5 - 5.1 mmol/L 4.3 3.6 4.0  Chloride 98 - 111 mmol/L 100 103 103  CO2 22 - 32 mmol/L 24 19(L) 23  Calcium 8.9 - 10.3 mg/dL 8.5(L) 8.2(L) 8.3(L)  Total Protein 6.5 - 8.1 g/dL - 6.6 -  Total Bilirubin 0.3 - 1.2 mg/dL - 0.6 -  Alkaline Phos 38 - 126 U/L - 59 -  AST 15 - 41 U/L - 14(L) -  ALT 0 - 44 U/L - 10 -    CXR: Left lower  lobe airspace disease which may reflect atelectasis versus pneumonia  Discharge Instructions: Discharge Instructions    Diet - low sodium heart healthy   Complete by:  As directed    Discharge instructions   Complete by:  As directed    Julie Williams, Julie Williams were admitted to the hospital because your blood count dropped. We call this anemia. Thankfully, your blood count and symptoms improved after receiving 2 blood transfusions. We have set you up with home health services to continue physical therapy and wound care. Please schedule a follow up with your primary doctor in the next 1-2 weeks. If you experience shortness of breath, chest pain, abdominal pain please call the clinic first and the nurse will try to assist you. Sometimes we can see you in clinic same day and avoid a visit to the ER.   For home use only DME 3 n 1   Complete by:  As directed    Increase activity slowly   Complete by:  As directed     Recheck CBC at next appointment with PCP Continue activity and diet as tolerated  Signed: Isabelle Course, MD 10/05/2018, 2:33 PM   Pager: [phone]

## 2018-10-01 NOTE — Discharge Instructions (Signed)
Wound Care: - Dressing procedure/placement/frequency: Place saline moistened gauze into the abdominal wound bed. Cover with ABD pads.   Tape in place. Change each shift. - Ostomy care    Colostomy Home Guide, Adult  Colostomy surgery is done to create an opening in the front of the abdomen for stool (feces) to leave the body through an ostomy (stoma). Part of the large intestine is attached to the stoma. A bag, also called a pouch, is fitted over the stoma. Stool and gas will collect in the bag. After surgery, you will need to empty and change your colostomy bag as needed. You will also need to care for your stoma. How to care for the stoma Your stoma should look pink, red, and moist, like the inside of your cheek. Soon after surgery, the stoma may be swollen, but this swelling will go away within 6 weeks. To care for the stoma:  Keep the skin around the stoma clean and dry.  Use a clean, soft washcloth to gently wash the stoma and the skin around it. Clean using a circular motion, and wipe away from the stoma opening, not toward it. ? Use warm water and only use cleansers recommended by your health care provider. ? Rinse the stoma area with plain water. ? Dry the area around the stoma well.  Use stoma powder or ointment on your skin only as told by your health care provider. Do not use any other powders, gels, wipes, or creams on the skin around the stoma.  Check the stoma area every day for signs of infection. Check for: ? New or worsening redness, swelling, or pain. ? New or increased fluid or blood. ? Pus or warmth.  Measure the stoma opening regularly and record the size. Watch for changes. (It is normal for the stoma to get smaller as swelling goes away.) Share this information with your health care provider. How to empty the colostomy bag  Empty your bag at bedtime and whenever it is one-third to one-half full. Do not let the bag get more than half-full with stool or gas. The bag  could leak if it gets too full. Some colostomy bags have a built-in gas release valve that releases gas often throughout the day. Follow these basic steps: 1. Wash your hands with soap and water. 2. Sit far back on the toilet seat. 3. Put several pieces of toilet paper into the toilet water. This will prevent splashing as you empty stool into the toilet. 4. Remove the clip or the hook-and-loop fastener from the tail end of the bag. 5. Unroll the tail, then empty the stool into the toilet. 6. Clean the tail with toilet paper or a moist towelette. 7. Reroll the tail, and close it with the clip or the hook-and-loop fastener. 8. Wash your hands again. How to change the colostomy bag Change your bag every 3-4 days or as often as told by your health care provider. Also change the bag if it is leaking or separating from the skin, or if your skin around the stoma looks or feels irritated. Irritated skin may be a sign that the bag is leaking. Always have colostomy supplies with you, and follow these basic steps: 1. Wash your hands with soap and water. Have paper towels or tissues nearby to clean any discharge. 2. Remove the old bag and skin barrier. Use your fingers or a warm cloth to gently push the skin away from the barrier. 3. Clean the stoma area with water  or with mild soap and water, as directed. Use water to rinse away any soap. 4. Dry the skin. You may use the cool setting on a hair dryer to do this. 5. Use a tracing pattern (template) to cut the skin barrier to the size needed. 6. If you are using a two-piece bag, attach the bag and the skin barrier to each other. Add the barrier ring, if you use one. 7. If directed, apply stoma powder or skin barrier gel to the skin. 8. Warm the skin barrier with your hands, or blow with a hair dryer for 5-10 seconds. 9. Remove the paper from the adhesive strip of the skin barrier. 10. Press the adhesive strip onto the skin around the stoma. 11. Gently rub  the skin barrier onto the skin. This creates heat that helps the barrier to stick. 12. Apply stoma tape to the edges of the skin barrier, if desired. 33. Wash your hands again. General recommendations  Avoid wearing tight clothes or having anything press directly on your stoma or bag. Change your clothing whenever it is soiled or damp.  You may shower or bathe with the bag on or off. Do not use harsh or oily soaps or lotions. Dry the skin and bag after bathing.  Store all supplies in a cool, dry place. Do not leave supplies in extreme heat because some parts can melt or not stick as well.  Whenever you leave home, take extra clothing and an extra skin barrier and bag with you.  If your bag gets wet, you can dry it with a hair dryer on the cool setting.  To prevent odor, you may put drops of ostomy deodorizer in the bag.  If recommended by your health care provider, put ostomy lubricant inside the bag. This helps stool to slide out of the bag more easily and completely. Contact a health care provider if:  You have new or worsening redness, swelling, or pain around your stoma.  You have new or increased fluid or blood coming from your stoma.  Your stoma feels warm to the touch.  You have pus coming from your stoma.  Your stoma extends in or out farther than normal.  You need to change your bag every day.  You have a fever. Get help right away if:  Your stool is bloody.  You have nausea or you vomit.  You have trouble breathing. Summary  Measure your stoma opening regularly and record the size. Watch for changes.  Empty your bag at bedtime and whenever it is one-third to one-half full. Do not let the bag get more than half-full with stool or gas.  Change your bag every 3-4 days or as often as told by your health care provider.  Whenever you leave home, take extra clothing and an extra skin barrier and bag with you. This information is not intended to replace advice given  to you by your health care provider. Make sure you discuss any questions you have with your health care provider. Document Released: 08/15/2003 Document Revised: 04/17/2018 Document Reviewed: 02/05/2017 Elsevier Interactive Patient Education  [employer].

## 2018-10-01 NOTE — Consult Note (Signed)
[place] Nurse ostomy follow up Patient agreed to enrollment in [facility] program.  Document completed, signed by patient, and electronically submitted to [facility]. Also, I have requested five of each of the following items for patient at time of discharge: barrier rings, Kellie Simmering [phone]; pouch barriers, Kellie Simmering #2; and pouches, Kellie Simmering [phone].  Val Riles, RN, MSN, CWOCN, CNS-BC, pager [phone]

## 2018-10-01 NOTE — Social Work (Signed)
CSW aware pt plan to discharge home. Have alerted [facility] and f/u with RN with [facility] to update them with plan as well.  CSW signing off. Please consult if any additional needs arise.  Alexander [place], Emily Work [phone]

## 2018-10-01 NOTE — Consult Note (Signed)
[place] Nurse wound consult note Patient receiving care in [facility] [phone].  No family present. Reason for Consult: Abdominal wound care Wound type: Surgical Measurement: 12.2 cm x 8.2 cm x 2.2 cm with undermining at 6 o'clock that measures 2 cm. Wound bed: 95% dark pink granulation tissue with 5% thick yellow in the inferior aspect of the wound just at the mons pubis area. Drainage (amount, consistency, odor) Some blue green on the existing gauze dressing. Periwound: Intact Dressing procedure/placement/frequency: Place saline moistened gauze into the abdominal wound bed. Cover with ABD pads. Tape in place. Change each shift. Monitor the wound area(s) for worsening of condition such as: Signs/symptoms of infection,  Increase in size,  Development of or worsening of odor, Development of pain, or increased pain at the affected locations.  Notify the medical team if any of these develop.  Thank you for the consult.  Discussed plan of care with the patient and bedside nurse.  [place] nurse will not follow at this time.  Please re-consult the [place] team if needed.  Val Riles, RN, MSN, CWOCN, CNS-BC, pager [phone]

## 2018-10-01 NOTE — Evaluation (Signed)
Occupational Therapy Evaluation Patient Details Name: Julie Williams MRN: [id number] DOB: 1966/08/02 Today's Date: 10/01/2018    History of Present Illness Patient is a 53 y/o female admitted to hopsital due to a decrease in hemoglobin following discharge from a complicated hospitalization. PMH of HTN, depression, rectal perforation, possibly related to ulcer, s/p bowel resection with colostomy, recent ICU admission for ARDS, Acute renal failure, acute metabolic encephalopathy, hepatic abscess, fusobacterium/enterococcus bacteremia, acute blood loss anemia s/p 2U PRBC and Feraheme    Clinical Impression   Pt PTA: living alone with friends assisting and small farm to tend to. Pt recently transferred to the hospital from attending CIR at [facility]. Pt was independent with ADL and mobility prior to hospitalization. Pt currently limited by pain and soreness in abdomen. Pt supervisionA for mobility in room as pt is slightly impulsive and requires cues to slow down, no AD used- fair balance. Pt HOH. Pt modified independence for ADLs in standing at sink for grooming and LB ADL at EOB. Pt's SpO2 levels >90% on RA; no dizziness reported. Pt tolerating session well. Pt would benefit from continued OT skilled services and 3in1 commode for low commodes and poor ability to care for self in [facility] setting.  No OT follow-up acutely. Thank you.    Follow Up Recommendations  Home health OT(home safety eval for garden tub and IADL ability)    Equipment Recommendations  3 in 1 bedside commode    Recommendations for Other Services       Precautions / Restrictions Precautions Precautions: Fall;Other (comment)(colostomy) Precaution Comments: abdominal wound, JP drain,  colostomy, watch BP; HOH Restrictions Weight Bearing Restrictions: No      Mobility Bed Mobility Overal bed mobility: Needs Assistance Bed Mobility: Supine to Sit;Sit to Supine     Supine to sit: Supervision Sit to supine: Supervision    General bed mobility comments: Patient required supervision for all bed mobility for safety.  Transfers Overall transfer level: Needs assistance Equipment used: Rolling walker (2 wheeled) Transfers: Sit to/from Stand Sit to Stand: Supervision         General transfer comment: no RW used, pt supervisionA for impulsiveness    Balance Overall balance assessment: Needs assistance Sitting-balance support: Feet supported;Bilateral upper extremity supported Sitting balance-Leahy Scale: Fair Sitting balance - Comments: Patient with BUE support in sitting. No LOB   Standing balance support: Bilateral upper extremity supported Standing balance-Leahy Scale: Fair Standing balance comment: reliant on BUE support and RW to maintain standing balance                           ADL either performed or assessed with clinical judgement   ADL Overall ADL's : At baseline                                       General ADL Comments: Pt Modified independent with self care in bed, at commode and standing at sink. Pt has garden tub at home and plans to sink bathe until she can get her dressings wet.     Vision Baseline Vision/History: No visual deficits Vision Assessment?: No apparent visual deficits     Perception     Praxis      Pertinent Vitals/Pain Pain Assessment: 0-10 Pain Score: 5  Pain Location: abdomen Pain Descriptors / Indicators: Grimacing;Guarding;Discomfort Pain Intervention(s): Repositioned     Hand Dominance  Extremity/Trunk Assessment Upper Extremity Assessment Upper Extremity Assessment: Overall WFL for tasks assessed   Lower Extremity Assessment Lower Extremity Assessment: Generalized weakness   Cervical / Trunk Assessment Cervical / Trunk Assessment: Normal   Communication Communication Communication: No difficulties   Cognition Arousal/Alertness: Awake/alert Behavior During Therapy: WFL for tasks assessed/performed Overall  Cognitive Status: No family/caregiver present to determine baseline cognitive functioning                         Following Commands: Follows one step commands consistently       General Comments: Patient A&Ox4. Appropriately responded and followed cues and commands.    General Comments  Pt SOB, but SpO2 levels remain >90% on RA.    Exercises     Shoulder Instructions      Home Living Family/patient expects to be discharged to:: Private residence Living Arrangements: Alone Available Help at Discharge: Friend(s);Available PRN/intermittently Type of Home: Mobile home Home Access: Stairs to enter Entrance Stairs-Number of Steps: 1 Entrance Stairs-Rails: Right Home Layout: One level     Bathroom Shower/Tub: Other (comment)(garden tub)   Bathroom Toilet: Standard     Home Equipment: None   Additional Comments: Pt reports she lives alone with her 26 chickens, dog and cat.  She reports she has a son in [place], who has not visited       Prior Functioning/Environment Level of Independence: Independent with assistive device(s)        Comments: Patient in rehab at SNF. Reports using a RW and wheel chair while at facility. Reports she was bathing and dressing independently.         OT Problem List: Decreased activity tolerance;Pain      OT Treatment/Interventions:      OT Goals(Current goals can be found in the care plan section)    OT Frequency:     Barriers to D/C: Decreased caregiver support          Co-evaluation              AM-PAC OT "6 Clicks" Daily Activity     Outcome Measure Help from another person eating meals?: None Help from another person taking care of personal grooming?: None Help from another person toileting, which includes using toliet, bedpan, or urinal?: None Help from another person bathing (including washing, rinsing, drying)?: None Help from another person to put on and taking off regular upper body clothing?: None Help  from another person to put on and taking off regular lower body clothing?: None 6 Click Score: 24   End of Session Nurse Communication: Mobility status  Activity Tolerance: Patient tolerated treatment well Patient left: in bed;with call bell/phone within reach  OT Visit Diagnosis: Unsteadiness on feet (R26.81);Muscle weakness (generalized) (M62.81)                Time: 6314-9702 OT Time Calculation (min): 20 min Charges:  OT General Charges $OT Visit: 1 Visit OT Evaluation $OT Eval Moderate Complexity: 1 Mod  Darryl Nestle) Marsa Aris OTR/L Acute Rehabilitation Services Pager: [phone] Office: [phone]   Fredda Hammed 10/01/2018, 12:42 PM

## 2018-10-01 NOTE — Care Management Note (Addendum)
Case Management Note  Patient Details  Name: AIZLEY STENSETH MRN: [id number] Date of Birth: 08-19-1966  Subjective/Objective:                    Action/Plan:  WOC will see patient regarding program for ostomy supplies. Will ask bedside nurse to provide patient will extra ostomy supplies at discharge.   Will home health orders . Received orders. Patient has no preference on home health agency. Awaiting call back from [place] with [facility] home, [facility] and home health [place] all unable to accept referral.  Dan with [facility] accepted referral.   Ordered 3 in 1. Karel Jarvis with [facility]. [place] aware discharge today  Expected Discharge Date:                  Expected Discharge Plan:  [place]  In-House Referral:     Discharge planning Services  CM Consult  Post Acute Care Choice:  Home Health Choice offered to:  Patient  DME Arranged:    DME Agency:     HH Arranged:    [place] Agency:     Status of Service:  In process, will continue to follow  If discussed at Long Length of Stay Meetings, dates discussed:    Additional Comments:  Marilu Favre, RN 10/01/2018, 1:24 PM

## 2018-10-02 LAB — BPAM RBC
Blood Product Expiration Date: 202002262359
Blood Product Expiration Date: 202002272359
ISSUE DATE / TIME: 202002060001
ISSUE DATE / TIME: 202002060253
UNIT TYPE AND RH: 7300
Unit Type and Rh: 7300

## 2018-10-02 LAB — TYPE AND SCREEN
ABO/RH(D): B POS
Antibody Screen: NEGATIVE
Unit division: 0
Unit division: 0

## 2018-10-02 LAB — URINE CULTURE
Culture: NO GROWTH
Special Requests: NORMAL

## 2018-10-04 DIAGNOSIS — I10 Essential (primary) hypertension: Secondary | ICD-10-CM | POA: Diagnosis not present

## 2018-10-04 DIAGNOSIS — Z9049 Acquired absence of other specified parts of digestive tract: Secondary | ICD-10-CM | POA: Diagnosis not present

## 2018-10-04 DIAGNOSIS — R071 Chest pain on breathing: Secondary | ICD-10-CM | POA: Diagnosis not present

## 2018-10-04 DIAGNOSIS — K219 Gastro-esophageal reflux disease without esophagitis: Secondary | ICD-10-CM | POA: Diagnosis not present

## 2018-10-04 DIAGNOSIS — D62 Acute posthemorrhagic anemia: Secondary | ICD-10-CM | POA: Diagnosis not present

## 2018-10-04 DIAGNOSIS — M19011 Primary osteoarthritis, right shoulder: Secondary | ICD-10-CM | POA: Diagnosis not present

## 2018-10-04 DIAGNOSIS — Z8719 Personal history of other diseases of the digestive system: Secondary | ICD-10-CM | POA: Diagnosis not present

## 2018-10-04 DIAGNOSIS — Z87891 Personal history of nicotine dependence: Secondary | ICD-10-CM | POA: Diagnosis not present

## 2018-10-04 DIAGNOSIS — Z433 Encounter for attention to colostomy: Secondary | ICD-10-CM | POA: Diagnosis not present

## 2018-10-04 DIAGNOSIS — D649 Anemia, unspecified: Secondary | ICD-10-CM | POA: Diagnosis not present

## 2018-10-04 DIAGNOSIS — G8929 Other chronic pain: Secondary | ICD-10-CM | POA: Diagnosis not present

## 2018-10-04 DIAGNOSIS — Z4801 Encounter for change or removal of surgical wound dressing: Secondary | ICD-10-CM | POA: Diagnosis not present

## 2018-10-04 DIAGNOSIS — F329 Major depressive disorder, single episode, unspecified: Secondary | ICD-10-CM | POA: Diagnosis not present

## 2018-10-04 DIAGNOSIS — F1120 Opioid dependence, uncomplicated: Secondary | ICD-10-CM | POA: Diagnosis not present

## 2018-10-04 DIAGNOSIS — M542 Cervicalgia: Secondary | ICD-10-CM | POA: Diagnosis not present

## 2018-10-04 DIAGNOSIS — H9190 Unspecified hearing loss, unspecified ear: Secondary | ICD-10-CM | POA: Diagnosis not present

## 2018-10-04 DIAGNOSIS — R109 Unspecified abdominal pain: Secondary | ICD-10-CM | POA: Diagnosis not present

## 2018-10-05 ENCOUNTER — Other Ambulatory Visit: Payer: Self-pay

## 2018-10-05 ENCOUNTER — Observation Stay (HOSPITAL_COMMUNITY): Payer: PPO

## 2018-10-05 ENCOUNTER — Ambulatory Visit (INDEPENDENT_AMBULATORY_CARE_PROVIDER_SITE_OTHER): Payer: PPO | Admitting: Internal Medicine

## 2018-10-05 ENCOUNTER — Encounter: Payer: Self-pay | Admitting: Internal Medicine

## 2018-10-05 ENCOUNTER — Inpatient Hospital Stay (HOSPITAL_COMMUNITY)
Admission: AD | Admit: 2018-10-05 | Discharge: 2018-10-13 | DRG: 816 | Disposition: A | Discharge status: Home Health | Payer: PPO | Source: Ambulatory Visit | Attending: Internal Medicine | Admitting: Internal Medicine

## 2018-10-05 VITALS — BP 118/75 | HR 125 | Temp 100.2°F | Ht 65.0 in | Wt 154.0 lb

## 2018-10-05 DIAGNOSIS — Z978 Presence of other specified devices: Secondary | ICD-10-CM | POA: Diagnosis not present

## 2018-10-05 DIAGNOSIS — Z933 Colostomy status: Secondary | ICD-10-CM | POA: Diagnosis not present

## 2018-10-05 DIAGNOSIS — D638 Anemia in other chronic diseases classified elsewhere: Secondary | ICD-10-CM | POA: Diagnosis not present

## 2018-10-05 DIAGNOSIS — Z9049 Acquired absence of other specified parts of digestive tract: Secondary | ICD-10-CM | POA: Diagnosis not present

## 2018-10-05 DIAGNOSIS — Z79891 Long term (current) use of opiate analgesic: Secondary | ICD-10-CM | POA: Diagnosis not present

## 2018-10-05 DIAGNOSIS — R112 Nausea with vomiting, unspecified: Secondary | ICD-10-CM

## 2018-10-05 DIAGNOSIS — R918 Other nonspecific abnormal finding of lung field: Secondary | ICD-10-CM | POA: Diagnosis not present

## 2018-10-05 DIAGNOSIS — R531 Weakness: Secondary | ICD-10-CM | POA: Diagnosis not present

## 2018-10-05 DIAGNOSIS — D649 Anemia, unspecified: Secondary | ICD-10-CM | POA: Diagnosis not present

## 2018-10-05 DIAGNOSIS — E860 Dehydration: Secondary | ICD-10-CM | POA: Diagnosis not present

## 2018-10-05 DIAGNOSIS — J069 Acute upper respiratory infection, unspecified: Secondary | ICD-10-CM | POA: Diagnosis not present

## 2018-10-05 DIAGNOSIS — D733 Abscess of spleen: Secondary | ICD-10-CM | POA: Diagnosis present

## 2018-10-05 DIAGNOSIS — K219 Gastro-esophageal reflux disease without esophagitis: Secondary | ICD-10-CM | POA: Diagnosis not present

## 2018-10-05 DIAGNOSIS — Z8701 Personal history of pneumonia (recurrent): Secondary | ICD-10-CM

## 2018-10-05 DIAGNOSIS — Z79899 Other long term (current) drug therapy: Secondary | ICD-10-CM

## 2018-10-05 DIAGNOSIS — G8929 Other chronic pain: Secondary | ICD-10-CM | POA: Diagnosis not present

## 2018-10-05 DIAGNOSIS — K5900 Constipation, unspecified: Secondary | ICD-10-CM | POA: Diagnosis not present

## 2018-10-05 DIAGNOSIS — K750 Abscess of liver: Secondary | ICD-10-CM | POA: Diagnosis not present

## 2018-10-05 DIAGNOSIS — T8143XA Infection following a procedure, organ and space surgical site, initial encounter: Secondary | ICD-10-CM | POA: Diagnosis not present

## 2018-10-05 DIAGNOSIS — K631 Perforation of intestine (nontraumatic): Secondary | ICD-10-CM | POA: Diagnosis present

## 2018-10-05 DIAGNOSIS — Z8619 Personal history of other infectious and parasitic diseases: Secondary | ICD-10-CM | POA: Diagnosis not present

## 2018-10-05 DIAGNOSIS — F339 Major depressive disorder, recurrent, unspecified: Secondary | ICD-10-CM | POA: Diagnosis not present

## 2018-10-05 DIAGNOSIS — B9689 Other specified bacterial agents as the cause of diseases classified elsewhere: Secondary | ICD-10-CM | POA: Diagnosis not present

## 2018-10-05 DIAGNOSIS — Z91030 Bee allergy status: Secondary | ICD-10-CM

## 2018-10-05 DIAGNOSIS — Z981 Arthrodesis status: Secondary | ICD-10-CM

## 2018-10-05 DIAGNOSIS — I10 Essential (primary) hypertension: Secondary | ICD-10-CM | POA: Diagnosis present

## 2018-10-05 DIAGNOSIS — R000 Tachycardia, unspecified: Secondary | ICD-10-CM

## 2018-10-05 DIAGNOSIS — R42 Dizziness and giddiness: Secondary | ICD-10-CM | POA: Diagnosis not present

## 2018-10-05 DIAGNOSIS — K651 Peritoneal abscess: Secondary | ICD-10-CM | POA: Diagnosis present

## 2018-10-05 DIAGNOSIS — F329 Major depressive disorder, single episode, unspecified: Secondary | ICD-10-CM | POA: Diagnosis present

## 2018-10-05 DIAGNOSIS — Z87891 Personal history of nicotine dependence: Secondary | ICD-10-CM

## 2018-10-05 DIAGNOSIS — K760 Fatty (change of) liver, not elsewhere classified: Secondary | ICD-10-CM | POA: Diagnosis not present

## 2018-10-05 DIAGNOSIS — J209 Acute bronchitis, unspecified: Secondary | ICD-10-CM

## 2018-10-05 DIAGNOSIS — H9193 Unspecified hearing loss, bilateral: Secondary | ICD-10-CM | POA: Diagnosis not present

## 2018-10-05 DIAGNOSIS — H9190 Unspecified hearing loss, unspecified ear: Secondary | ICD-10-CM | POA: Diagnosis present

## 2018-10-05 DIAGNOSIS — Z9889 Other specified postprocedural states: Secondary | ICD-10-CM

## 2018-10-05 DIAGNOSIS — A0689 Other amebic infections: Secondary | ICD-10-CM | POA: Diagnosis not present

## 2018-10-05 DIAGNOSIS — Z862 Personal history of diseases of the blood and blood-forming organs and certain disorders involving the immune mechanism: Secondary | ICD-10-CM | POA: Diagnosis not present

## 2018-10-05 DIAGNOSIS — Z8719 Personal history of other diseases of the digestive system: Secondary | ICD-10-CM | POA: Diagnosis not present

## 2018-10-05 DIAGNOSIS — R188 Other ascites: Secondary | ICD-10-CM | POA: Diagnosis not present

## 2018-10-05 DIAGNOSIS — R200 Anesthesia of skin: Secondary | ICD-10-CM | POA: Diagnosis not present

## 2018-10-05 DIAGNOSIS — R0602 Shortness of breath: Secondary | ICD-10-CM | POA: Diagnosis present

## 2018-10-05 DIAGNOSIS — G894 Chronic pain syndrome: Secondary | ICD-10-CM | POA: Diagnosis present

## 2018-10-05 LAB — RESPIRATORY PANEL BY PCR

## 2018-10-05 LAB — BASIC METABOLIC PANEL
Anion gap: 11 (ref 5–15)
BUN: 9 mg/dL (ref 6–20)
CALCIUM: 8.5 mg/dL — AB (ref 8.9–10.3)
CO2: 24 mmol/L (ref 22–32)
Chloride: 100 mmol/L (ref 98–111)
Creatinine, Ser: 1.02 mg/dL — ABNORMAL HIGH (ref 0.44–1.00)
GFR calc Af Amer: >60 mL/min (ref >60)
GFR calc non Af Amer: >60 mL/min (ref >60)
Glucose, Bld: 149 mg/dL — ABNORMAL HIGH (ref 70–99)
Potassium: 4.3 mmol/L (ref 3.5–5.1)
Sodium: 135 mmol/L (ref 135–145)

## 2018-10-05 LAB — CBC WITH DIFFERENTIAL/PLATELET
ABS IMMATURE GRANULOCYTES: 0.2 10*3/uL — AB (ref 0.00–0.07)
Basophils Absolute: 0 10*3/uL (ref 0.0–0.1)
Basophils Relative: 0 %
Eosinophils Absolute: 0 10*3/uL (ref 0.0–0.5)
Eosinophils Relative: 0 %
HCT: 32.5 % — ABNORMAL LOW (ref 36.0–46.0)
Hemoglobin: 9.9 g/dL — ABNORMAL LOW (ref 12.0–15.0)
Immature Granulocytes: 1 %
Lymphocytes Relative: 12 %
Lymphs Abs: 2 10*3/uL (ref 0.7–4.0)
MCH: 28.3 pg (ref 26.0–34.0)
MCHC: 30.5 g/dL (ref 30.0–36.0)
MCV: 92.9 fL (ref 80.0–100.0)
MONOS PCT: 4 %
Monocytes Absolute: 0.6 10*3/uL (ref 0.1–1.0)
NEUTROS PCT: 83 %
Neutro Abs: 13.2 10*3/uL — ABNORMAL HIGH (ref 1.7–7.7)
Platelets: 530 10*3/uL — ABNORMAL HIGH (ref 150–400)
RBC: 3.5 MIL/uL — ABNORMAL LOW (ref 3.87–5.11)
RDW: 14.6 % (ref 11.5–15.5)
WBC: 16.1 10*3/uL — ABNORMAL HIGH (ref 4.0–10.5)
nRBC: 0 % (ref 0.0–0.2)

## 2018-10-05 MED ORDER — MORPHINE SULFATE 15 MG PO TABS
15.0000 mg | ORAL_TABLET | Freq: Three times a day (TID) | ORAL | Status: DC
  Administered 2018-10-05 – 2018-10-09 (×12): 15 mg via ORAL
  Filled 2018-10-05 (×13): qty 1

## 2018-10-05 MED ORDER — HEPARIN SODIUM (PORCINE) 5000 UNIT/ML IJ SOLN
5000.0000 [IU] | Freq: Three times a day (TID) | INTRAMUSCULAR | Status: DC
  Administered 2018-10-05 – 2018-10-06 (×3): 5000 [IU] via SUBCUTANEOUS
  Filled 2018-10-05 (×3): qty 1

## 2018-10-05 MED ORDER — SODIUM CHLORIDE 0.9% FLUSH: 3.0000 mL | Freq: Two times a day (BID) | INTRAVENOUS | Status: DC

## 2018-10-05 MED ORDER — ONDANSETRON HCL 4 MG/2ML IJ SOLN
4.0000 mg | Freq: Four times a day (QID) | INTRAMUSCULAR | Status: DC | PRN
  Administered 2018-10-07: 4 mg via INTRAVENOUS
  Filled 2018-10-05: qty 2

## 2018-10-05 MED ORDER — SODIUM CHLORIDE 0.9% FLUSH
3.0000 mL | Freq: Two times a day (BID) | INTRAVENOUS | Status: DC
  Administered 2018-10-05 – 2018-10-12 (×5): 3 mL via INTRAVENOUS

## 2018-10-05 MED ORDER — VENLAFAXINE HCL ER 37.5 MG PO CP24
37.5000 mg | ORAL_CAPSULE | Freq: Every day | ORAL | Status: DC
  Administered 2018-10-06 – 2018-10-09 (×4): 37.5 mg via ORAL
  Filled 2018-10-05 (×4): qty 1

## 2018-10-05 MED ORDER — SODIUM CHLORIDE 0.9 % IV SOLN
INTRAVENOUS | Status: DC
  Administered 2018-10-05 – 2018-10-07 (×3): via INTRAVENOUS

## 2018-10-05 MED ORDER — SODIUM CHLORIDE 0.9 % IV BOLUS
1000.0000 mL | Freq: Once | INTRAVENOUS | Status: AC
  Administered 2018-10-05: 1000 mL via INTRAVENOUS

## 2018-10-05 MED ORDER — CALCIUM CARBONATE ANTACID 500 MG PO CHEW
400.0000 mg | CHEWABLE_TABLET | Freq: Two times a day (BID) | ORAL | Status: DC | PRN
  Administered 2018-10-09: 400 mg via ORAL
  Filled 2018-10-05 (×2): qty 2

## 2018-10-05 MED ORDER — QUETIAPINE FUMARATE 50 MG PO TABS
25.0000 mg | ORAL_TABLET | Freq: Every day | ORAL | Status: DC
  Administered 2018-10-05 – 2018-10-07 (×2): 25 mg via ORAL
  Filled 2018-10-05 (×4): qty 1

## 2018-10-05 MED ORDER — IOHEXOL 300 MG/ML  SOLN
100.0000 mL | Freq: Once | INTRAMUSCULAR | Status: AC | PRN
  Administered 2018-10-05: 100 mL via INTRAVENOUS

## 2018-10-05 MED ORDER — LABETALOL HCL 100 MG PO TABS
100.0000 mg | ORAL_TABLET | Freq: Two times a day (BID) | ORAL | Status: DC
  Administered 2018-10-08 – 2018-10-09 (×2): 100 mg via ORAL
  Filled 2018-10-05 (×7): qty 1

## 2018-10-05 MED ORDER — PIPERACILLIN-TAZOBACTAM 3.375 G IVPB
3.3750 g | Freq: Three times a day (TID) | INTRAVENOUS | Status: DC
  Administered 2018-10-05 – 2018-10-06 (×3): 3.375 g via INTRAVENOUS
  Filled 2018-10-05 (×3): qty 50

## 2018-10-05 MED ORDER — GUAIFENESIN 100 MG/5ML PO SYRP
200.0000 mg | ORAL_SOLUTION | Freq: Once | ORAL | Status: AC
  Administered 2018-10-05: 200 mg via ORAL

## 2018-10-05 MED ORDER — ACETAMINOPHEN 650 MG RE SUPP: 650.0000 mg | Freq: Four times a day (QID) | RECTAL | Status: DC | PRN

## 2018-10-05 MED ORDER — GUAIFENESIN 100 MG/5ML PO SOLN: 10.0000 mL | Freq: Once | ORAL | 0 refills | Status: DC

## 2018-10-05 MED ORDER — PANTOPRAZOLE SODIUM 40 MG PO TBEC
40.0000 mg | DELAYED_RELEASE_TABLET | Freq: Every day | ORAL | Status: DC
  Administered 2018-10-06 – 2018-10-09 (×4): 40 mg via ORAL
  Filled 2018-10-05 (×5): qty 1

## 2018-10-05 MED ORDER — ACETAMINOPHEN 325 MG PO TABS
650.0000 mg | ORAL_TABLET | Freq: Four times a day (QID) | ORAL | Status: DC | PRN
  Administered 2018-10-05 – 2018-10-13 (×13): 650 mg via ORAL
  Filled 2018-10-05 (×13): qty 2

## 2018-10-05 MED ORDER — SODIUM CHLORIDE 0.9 % IV SOLN
250.0000 mL | INTRAVENOUS | Status: DC | PRN
  Administered 2018-10-05 – 2018-10-11 (×3): 250 mL via INTRAVENOUS

## 2018-10-05 MED ORDER — ONDANSETRON HCL 4 MG PO TABS: 4.0000 mg | ORAL_TABLET | Freq: Four times a day (QID) | ORAL | Status: DC | PRN

## 2018-10-05 MED ORDER — SENNOSIDES-DOCUSATE SODIUM 8.6-50 MG PO TABS: 1.0000 | ORAL_TABLET | Freq: Every evening | ORAL | Status: DC | PRN

## 2018-10-05 MED ORDER — GUAIFENESIN 100 MG/5ML PO SOLN
30.0000 mL | Freq: Four times a day (QID) | ORAL | Status: DC
  Administered 2018-10-05 – 2018-10-09 (×14): 600 mg via ORAL
  Filled 2018-10-05 (×17): qty 30

## 2018-10-05 MED ORDER — SODIUM CHLORIDE 0.9% FLUSH: 3.0000 mL | INTRAVENOUS | Status: DC | PRN
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
## 2018-10-05 NOTE — Assessment & Plan Note (Signed)
She was recently admitted for symptomatic anemia with a hemoglobin of 6.  She received 2 units packed red blood cells with a discharge hemoglobin of 9.4.  We will plan on repeating CBC today.  Plan: 1.  Repeat CBC today.

## 2018-10-05 NOTE — Plan of Care (Signed)
Pt resting quietly in room.  Report called to Loisa on 6N.  [place]

## 2018-10-05 NOTE — Patient Outreach (Signed)
Screening:  New referral on 10/02/2018.  Reviewed Epic to outreach patient.  Patient at MD office and is pending admission,  PLAN: will follow for discharge plan.  Tomasa Rand, RN, BSN, CEN [facility] [employer] [phone]

## 2018-10-05 NOTE — Progress Notes (Signed)
Pharmacy Antibiotic Note  Julie Williams is a 53 y.o. female admitted on 10/05/2018 with intra-abd infection.  Noted pt with recent hospital admission (12/19-1/24) for rectal perforation s/p colostomy complicated by bacteremia, ARDS, aspiration pneumonia, and acute blood loss anemia. Pharmacy has been consulted for Zosyn dosing.  Plan: Zosyn 3.375gm IV q8h - doses over 4 hours Will f/u micro data, renal function, and pt's clinical condition       Temp (24hrs), Avg:99.6 F (37.6 C), Min:98.4 F (36.9 C), Max:100.2 F (37.9 C)  Recent Labs  Lab 09/30/18 2130 10/01/18 0258 10/05/18 1140  WBC 12.1* 10.5 16.1*  CREATININE 1.09* 0.97 1.02*    Estimated Creatinine Clearance: 61.9 mL/min (A) (by C-G formula based on SCr of 1.02 mg/dL (H)).    Allergies  Allergen Reactions  . Bee Venom Swelling    Antimicrobials this admission: 2/10 Zosyn >>   Microbiology results: 2/10 Resp panel >>negative  Thank you for allowing pharmacy to be a part of this patient's care.  Sherlon Handing, PharmD, BCPS Clinical pharmacist  **Pharmacist phone directory can now be found on [email or web address] ([email or web address]).  Listed under [place]. 10/05/2018 10:53 PM

## 2018-10-05 NOTE — Assessment & Plan Note (Signed)
Julie Williams presented with nausea and vomiting with associated decrease ostomy output.  Differential for low ostomy output includes ileus and food blockage proximal to the ostomy site.  I do believe that this likely led to stool backing up with subsequent nausea and vomiting leading to dehydration.  It is reassuring that she is now having normal ostomy output but I am still concerned about her tachycardia, weakness, and dizziness.  She will be admitted for fluid resuscitation and observation of her ostomy output.  Plan: 1.  Admit to inpatient service 2.  Will give 1 L IV fluid bolus in clinic 3.  Ordered CBC and BMP stat

## 2018-10-05 NOTE — Assessment & Plan Note (Signed)
Ms. Eppes has a 2-day history of cough and congestion with normal pulmonary exam.  I do believe that her symptoms are likely due to a viral upper respiratory infection.  We will plan on symptomatic management for now.  Plan: 1.  Ordered Robitussin 10 mLs once

## 2018-10-05 NOTE — Progress Notes (Addendum)
Call to Dr.  Rogelia Boga patient's surgeon at [phone] to reschedule appointment for 10/06/2018 9:AM until 10:45 AM on 10/06/2018.  Office would like a call in the am if patient will need to reschedule this  appointment.  Patient was made aware of the appointment.   Sander Nephew, RN  10/05/2018 1229 .
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
## 2018-10-06 ENCOUNTER — Observation Stay (HOSPITAL_COMMUNITY): Payer: PPO

## 2018-10-06 DIAGNOSIS — Z933 Colostomy status: Secondary | ICD-10-CM | POA: Diagnosis not present

## 2018-10-06 DIAGNOSIS — R000 Tachycardia, unspecified: Secondary | ICD-10-CM | POA: Diagnosis not present

## 2018-10-06 DIAGNOSIS — R200 Anesthesia of skin: Secondary | ICD-10-CM | POA: Diagnosis not present

## 2018-10-06 DIAGNOSIS — Z981 Arthrodesis status: Secondary | ICD-10-CM | POA: Diagnosis not present

## 2018-10-06 DIAGNOSIS — F339 Major depressive disorder, recurrent, unspecified: Secondary | ICD-10-CM | POA: Diagnosis not present

## 2018-10-06 DIAGNOSIS — D733 Abscess of spleen: Secondary | ICD-10-CM | POA: Diagnosis present

## 2018-10-06 DIAGNOSIS — J209 Acute bronchitis, unspecified: Secondary | ICD-10-CM | POA: Diagnosis present

## 2018-10-06 DIAGNOSIS — R0602 Shortness of breath: Secondary | ICD-10-CM | POA: Diagnosis present

## 2018-10-06 DIAGNOSIS — Z87891 Personal history of nicotine dependence: Secondary | ICD-10-CM | POA: Diagnosis not present

## 2018-10-06 DIAGNOSIS — Z91030 Bee allergy status: Secondary | ICD-10-CM | POA: Diagnosis not present

## 2018-10-06 DIAGNOSIS — K219 Gastro-esophageal reflux disease without esophagitis: Secondary | ICD-10-CM | POA: Diagnosis present

## 2018-10-06 DIAGNOSIS — G8929 Other chronic pain: Secondary | ICD-10-CM | POA: Diagnosis not present

## 2018-10-06 DIAGNOSIS — K750 Abscess of liver: Secondary | ICD-10-CM | POA: Diagnosis not present

## 2018-10-06 DIAGNOSIS — G894 Chronic pain syndrome: Secondary | ICD-10-CM | POA: Diagnosis present

## 2018-10-06 DIAGNOSIS — K651 Peritoneal abscess: Secondary | ICD-10-CM | POA: Diagnosis not present

## 2018-10-06 DIAGNOSIS — F329 Major depressive disorder, single episode, unspecified: Secondary | ICD-10-CM | POA: Diagnosis present

## 2018-10-06 DIAGNOSIS — H9190 Unspecified hearing loss, unspecified ear: Secondary | ICD-10-CM | POA: Diagnosis present

## 2018-10-06 DIAGNOSIS — D649 Anemia, unspecified: Secondary | ICD-10-CM | POA: Diagnosis not present

## 2018-10-06 DIAGNOSIS — B9689 Other specified bacterial agents as the cause of diseases classified elsewhere: Secondary | ICD-10-CM | POA: Diagnosis not present

## 2018-10-06 DIAGNOSIS — Z79891 Long term (current) use of opiate analgesic: Secondary | ICD-10-CM | POA: Diagnosis not present

## 2018-10-06 DIAGNOSIS — I10 Essential (primary) hypertension: Secondary | ICD-10-CM | POA: Diagnosis present

## 2018-10-06 DIAGNOSIS — K5900 Constipation, unspecified: Secondary | ICD-10-CM | POA: Diagnosis not present

## 2018-10-06 LAB — PROTIME-INR
INR: 1.33
Prothrombin Time: 16.3 seconds — ABNORMAL HIGH (ref 11.4–15.2)

## 2018-10-06 LAB — CBC
HCT: 26.8 % — ABNORMAL LOW (ref 36.0–46.0)
Hemoglobin: 8.2 g/dL — ABNORMAL LOW (ref 12.0–15.0)
MCH: 28.5 pg (ref 26.0–34.0)
MCHC: 30.6 g/dL (ref 30.0–36.0)
MCV: 93.1 fL (ref 80.0–100.0)
Platelets: 434 10*3/uL — ABNORMAL HIGH (ref 150–400)
RBC: 2.88 MIL/uL — AB (ref 3.87–5.11)
RDW: 14.6 % (ref 11.5–15.5)
WBC: 13 10*3/uL — ABNORMAL HIGH (ref 4.0–10.5)
nRBC: 0 % (ref 0.0–0.2)

## 2018-10-06 LAB — BASIC METABOLIC PANEL
ANION GAP: 11 (ref 5–15)
BUN: 7 mg/dL (ref 6–20)
CO2: 22 mmol/L (ref 22–32)
Calcium: 7.9 mg/dL — ABNORMAL LOW (ref 8.9–10.3)
Chloride: 103 mmol/L (ref 98–111)
Creatinine, Ser: 0.88 mg/dL (ref 0.44–1.00)
GFR calc non Af Amer: >60 mL/min (ref >60)
Glucose, Bld: 89 mg/dL (ref 70–99)
Potassium: 3.9 mmol/L (ref 3.5–5.1)
Sodium: 136 mmol/L (ref 135–145)

## 2018-10-06 MED ORDER — SODIUM CHLORIDE 0.9% FLUSH
5.0000 mL | Freq: Three times a day (TID) | INTRAVENOUS | Status: DC
  Administered 2018-10-06 – 2018-10-12 (×14): 5 mL

## 2018-10-06 MED ORDER — FENTANYL CITRATE (PF) 100 MCG/2ML IJ SOLN
INTRAMUSCULAR | Status: AC | PRN
  Administered 2018-10-06 (×2): 50 ug via INTRAVENOUS

## 2018-10-06 MED ORDER — FENTANYL CITRATE (PF) 100 MCG/2ML IJ SOLN
INTRAMUSCULAR | Status: AC
  Filled 2018-10-06: qty 2

## 2018-10-06 MED ORDER — LIDOCAINE HCL (PF) 1 % IJ SOLN
INTRAMUSCULAR | Status: AC
  Filled 2018-10-06: qty 30

## 2018-10-06 MED ORDER — HEPARIN SODIUM (PORCINE) 5000 UNIT/ML IJ SOLN
5000.0000 [IU] | Freq: Three times a day (TID) | INTRAMUSCULAR | Status: DC
  Filled 2018-10-06 (×9): qty 1

## 2018-10-06 MED ORDER — MORPHINE SULFATE (PF) 4 MG/ML IV SOLN
7.5000 mg | Freq: Three times a day (TID) | INTRAVENOUS | Status: DC | PRN
  Filled 2018-10-06: qty 2

## 2018-10-06 MED ORDER — PIPERACILLIN-TAZOBACTAM 3.375 G IVPB
3.3750 g | Freq: Three times a day (TID) | INTRAVENOUS | Status: DC
  Administered 2018-10-07 – 2018-10-10 (×11): 3.375 g via INTRAVENOUS
  Filled 2018-10-06 (×11): qty 50

## 2018-10-06 NOTE — Progress Notes (Signed)
Patient ID: Julie Williams, female   DOB: 09-01-1965, 53 y.o.   MRN: [id number]       Subjective: Patient is well known to our service as she was transferred from [facility] on 12/30 s/p ex lap with sigmoid and proximal rectal resection with diverting colostomy.  She was tx to [facility] for VDRF and further care.  She developed wound dehiscence and was getting WD dressing changes.  She ultimately developed an intrahepatic abscess that had a drain placed by IR on 1/14.  She was ultimately stable for DC to rehab around mid January.  She doesn't know for sure a date, but had her liver drain removed in early February.  She return home from rehab last Thursday doing well.  She states over the last several days that she has had some SOB with mucus production.  She then developed some nausea.  She also noticed that her colostomy would put out a lot at one time several different times.  All of these things have resolved, except she feels like her heart is still racing.  She saw her primary care who felt she should be admitted for observation.  They ordered a CT scan that revealed fluid collections/loculated infected ascites/or abscesses.  We have been asked to see the patient for further recommendations.    Objective: Vital signs in last 24 hours: Temp:  [98.3 F (36.8 C)-100.2 F (37.9 C)] 98.6 F (37 C) (02/11 0907) Pulse Rate:  [109-125] 125 (02/11 0907) Resp:  [18-25] 24 (02/11 0907) BP: (101-130)/(71-98) 108/72 (02/11 0907) SpO2:  [95 %-97 %] 95 % (02/11 0907) Last BM Date: 10/06/18  Intake/Output from previous day: 02/10 0701 - 02/11 0700 In: 2680.2 [P.O.:1602; I.V.:1006.2; IV Piggyback:72] Out: -  Intake/Output this shift: Total I/O In: 840 [P.O.:840] Out: 200 [Stool:200]  PE: Heart: tachycardic, but NSR Lungs: CTAB Abd: soft, some tender, but no guarding or peritonitis, +BS, LLQ colostomy with pink, viable stoma and appropriate output.  Midline wound picture did not save.  This is 100%  clean with granulation tissue.  Wound is clearly dehisced with suture hanging in the wound.  This is easily 5-6cm in width.  Fascia is clearly not intact.  Lab Results:  Recent Labs    10/05/18 1140 10/06/18 0252  WBC 16.1* 13.0*  HGB 9.9* 8.2*  HCT 32.5* 26.8*  PLT 530* 434*   BMET Recent Labs    10/05/18 1140 10/06/18 0252  NA 135 136  K 4.3 3.9  CL 100 103  CO2 24 22  GLUCOSE 149* 89  BUN 9 7  CREATININE 1.02* 0.88  CALCIUM 8.5* 7.9*   PT/INR Recent Labs    10/06/18 1151  LABPROT 16.3*  INR 1.33   CMP     Component Value Date/Time   NA 136 10/06/2018 0252   K 3.9 10/06/2018 0252   CL 103 10/06/2018 0252   CO2 22 10/06/2018 0252   GLUCOSE 89 10/06/2018 0252   BUN 7 10/06/2018 0252   CREATININE 0.88 10/06/2018 0252   CREATININE 0.72 04/28/2014 1201   CALCIUM 7.9 (L) 10/06/2018 0252   PROT 6.6 10/01/2018 0258   ALBUMIN 1.8 (L) 10/01/2018 0258   AST 14 (L) 10/01/2018 0258   ALT 10 10/01/2018 0258   ALKPHOS 59 10/01/2018 0258   BILITOT 0.6 10/01/2018 0258   GFRNONAA >60 10/06/2018 0252   GFRNONAA >89 04/28/2014 1201   GFRAA >60 10/06/2018 0252   GFRAA >89 04/28/2014 1201   Lipase  Component Value Date/Time   LIPASE 87 (H) 08/23/2018 1834       Studies/Results: Dg Chest 2 View  Result Date: 10/05/2018 CLINICAL DATA:  Productive cough for 2 days. Tachycardia, weakness and dizziness. EXAM: CHEST - 2 VIEW COMPARISON:  Chest x-rays dated 10/01/2018, 09/07/2018 and 08/25/2018. FINDINGS: Heart size and mediastinal contours are stable. Linear opacities at the bilateral lung bases, atelectasis versus pneumonia. Lungs appear otherwise clear. No pleural effusion or pneumothorax seen. Osseous structures about the chest are unremarkable. IMPRESSION: Bibasilar opacities. These could represent atelectasis, pneumonia or aspiration. Favor atelectasis. Electronically Signed   By: Franki Cabot M.D.   On: 10/05/2018 18:20   Abd 1 View (kub)  Result Date:  10/05/2018 CLINICAL DATA:  Low ostomy output with nausea and vomiting. EXAM: ABDOMEN - 1 VIEW COMPARISON:  None. FINDINGS: LEFT lower quadrant ostomy. Bowel gas pattern appears nonobstructive. No evidence of abnormal fluid collection. No evidence of free intraperitoneal air. IMPRESSION: Nonobstructive bowel gas pattern. Electronically Signed   By: Franki Cabot M.D.   On: 10/05/2018 18:21   Ct Abdomen Pelvis W Contrast  Result Date: 10/05/2018 CLINICAL DATA:  Fever and leukocytosis. Abdominal pain. History of abscess. Abscess suspected. EXAM: CT ABDOMEN AND PELVIS WITH CONTRAST TECHNIQUE: Multidetector CT imaging of the abdomen and pelvis was performed using the standard protocol following bolus administration of intravenous contrast. CONTRAST:  176mL OMNIPAQUE IOHEXOL 300 MG/ML  SOLN COMPARISON:  Plain films 10/05/2018.  CT of 09/02/2018. FINDINGS: Lower chest: bibasilar airspace disease, somewhat more confluent than on the prior CT. There is also incompletely imaged probable consolidation within the lingula. Trace bilateral pleural effusions, decreased from 09/02/2018. Normal heart size. Hepatobiliary: Moderate hepatic steatosis and mild hepatomegaly at 18.9 cm craniocaudal. Perihepatic fluid collections including a subcapsular collection along the right hepatic lobe of 9.3 x 2.8 by 6.3 cm on image 16/3. Smaller collection along the lateral segment left liver lobe is likely extracapsular, including on image 19/3. Normal gallbladder, without biliary ductal dilatation. Pancreas: Normal, without mass or ductal dilatation. Spleen: Perisplenic loculated collection superiorly measures 10.7 x 9.4 by 13.9 cm and is increased from 9.9 x 7.9 cm on the prior exam. Adrenals/Urinary Tract: Normal adrenal glands. Left extrarenal pelvis. Normal right kidney. Normal urinary bladder. Stomach/Bowel: The proximal stomach is contracted. Apparent wall thickening is likely secondary. Hartmann's pouch and descending colostomy.  Normal terminal ileum. Normal small bowel caliber. Vascular/Lymphatic: Aortic and branch vessel atherosclerosis. Multiple small retroperitoneal nodes are not pathologic by size criteria and likely reactive. No pelvic sidewall adenopathy. Reproductive: Normal uterus and adnexa. Other: No significant free fluid. No free intraperitoneal air. Small volume relatively ill-defined small bowel mesenteric fluid at 3.2 cm on image 50/3. Midline open abdominal laparotomy site. Musculoskeletal: Degenerate disc disease at the lumbosacral junction IMPRESSION: 1. Multiple loculated fluid collections, suspicious for infected ascites or developing abscesses. 2. Status post left lower quadrant colostomy and Hartmann's pouch, without obstruction. 3. Persistent bibasilar pneumonia with probable confluent infection within the lingula, incompletely imaged. Decreased small bilateral pleural effusions. 4. Hepatic steatosis and hepatomegaly. These results will be called to the ordering clinician or representative by the Radiologist Assistant, and communication documented in the PACS or zVision Dashboard. Electronically Signed   By: Abigail Miyamoto M.D.   On: 10/05/2018 21:44    Anti-infectives: Anti-infectives (From admission, onward)   Start     Dose/Rate Route Frequency Ordered Stop   10/05/18 2300  piperacillin-tazobactam (ZOSYN) IVPB 3.375 g     3.375 g 12.5 mL/hr over  240 Minutes Intravenous Every 8 hours 10/05/18 2256         Assessment/Plan S/p ex lap with Hartman's for rectal perforation at [facility] with wound dehiscence -place mepitel over wound prior to NS WD dressing changes to minimize risk of fistula formation  Intra-abdominal fluid collections  -agree with IR evaluation to determine if these are amenable to aspiration vs drainage. -will need outpatient follow up with surgeon in [facility].  Was scheduled for today. -no surgical indications  FEN - NPO for procedure VTE - Heparin ID - zosyn    LOS: 1 day    Henreitta Cea , [facility] Surgery 10/06/2018, 2:05 PM Pager: [phone]

## 2018-10-06 NOTE — Procedures (Signed)
Interventional Radiology Procedure:   Indications: Intra-abdominal fluid collections / abscesses  Procedure: US guided drain placement in LUQ abscess ; US guided drain placement in RUQ abscess  Findings: Thick greenish / gray foul smelling fluid removed from bilateral collections.   450 ml removed from LUQ and 130 ml removed from RUQ  Complications: None     EBL: Minimal  Plan: Follow drain outputs and send fluid for cultures.  Will need follow up CT to confirm that RUQ drain is decompressing all of the perihepatic collections.   [place] Wank R. Anselm Pancoast, MD  Pager: [phone]

## 2018-10-06 NOTE — Care Management Note (Signed)
Case Management Note  Patient Details  Name: Julie Williams MRN: [id number] Date of Birth: 09-15-1965  Subjective/Objective:                    Action/Plan:  Patient recently discharged. From home. Active with [facility] for RN,PT,OT.  Last admission was enrolled in ostomy supply program by [place].   Will continue to follow. Expected Discharge Date:                  Expected Discharge Plan:  [place]  In-House Referral:     Discharge planning Services  CM Consult  Post Acute Care Choice:  Home Health Choice offered to:  Patient  DME Arranged:  N/A DME Agency:     HH Arranged:  PT, OT, RN [place] Agency:  [place]  Status of Service:  In process, will continue to follow  If discussed at Long Length of Stay Meetings, dates discussed:    Additional Comments:  Marilu Favre, RN 10/06/2018, 10:29 AM

## 2018-10-06 NOTE — Progress Notes (Signed)
   Subjective: Feeling rested. Cough has improved. Continues to endorse abdominal pain and sob but appears comfortable. Discussed findings of abdominal imaging and plan to consult surgery and IR.   Objective:  Vital signs in last 24 hours: Vitals:   10/05/18 2024 10/05/18 2356 10/06/18 0428 10/06/18 0907  BP: 130/89 101/71 (!) 120/98 108/72  Pulse: (!) 115 (!) 109 (!) 115 (!) 125  Resp: (!) 22 20 18  (!) 24  Temp: 100.1 F (37.8 C) 98.3 F (36.8 C) 99.3 F (37.4 C) 98.6 F (37 C)  TempSrc: Oral Oral Oral Oral  SpO2: 95% 97% 95% 95%   Gen: laying in bed, resting comfortably Pulm: CTAB Abd: bandage over open midline incision was removed and the wound had healthy appearing granulation tissue around the edges, no foul odor or discharge, packing guaze was not removed. Ostomy bag with soft brown output. Abdomen is diffusely TTP with guarding   Assessment/Plan:  Active Problems:   Intractable nausea and vomiting   52yoF with recent hospital admission (12/19-1/24) for rectal perforation s/p colostomy complicated by bacteremia, ARDS, aspiration pneumonia, and acute blood loss anemia. Discharged to rehab facility and then admitted 2/5-2/6 for symptomatic anemia Hgb 6.0, given 2U pRBCs and discharged home with [facility] who now presents with one day of sob, cough productive of clear mucous, dry heaving, chest congestion, subjective fevers, sore throat, and diaphoresis. Initial eval pertinent for tachycardia HR 154, crackles at the left base, and leukocytosis 16 with left shift.   Intraabdominal Abscess: Likely source of her leukocytosis, low grade fever, and tachycardia. CT shows multiple loculated fluid collections suspicious for infected ascites or developing abscesses. Started on Zosyn overnight. Abdominal wound was examined, dressing removed, and did not appear infected. She has good output from colostomy.  - consult gen surg: they will see patient - consult IR for possible aspiration  -  continue IV zosyn  - abdominal wound dressing changes bid  - NPO for possible IR intervention   Depression: continue home venlafaxine  GERD: home pantoprazole HTN: home amlodipine   Dispo: Anticipated discharge pending surgical evaluation  Isabelle Course, MD 10/06/2018, 10:20 AM Pager: [phone]
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
## 2018-10-07 ENCOUNTER — Other Ambulatory Visit: Payer: Self-pay

## 2018-10-07 ENCOUNTER — Encounter (HOSPITAL_COMMUNITY): Payer: Self-pay | Admitting: General Practice

## 2018-10-07 LAB — CBC
HCT: 24.8 % — ABNORMAL LOW (ref 36.0–46.0)
HEMOGLOBIN: 7.5 g/dL — AB (ref 12.0–15.0)
MCH: 28 pg (ref 26.0–34.0)
MCHC: 30.2 g/dL (ref 30.0–36.0)
MCV: 92.5 fL (ref 80.0–100.0)
Platelets: 455 10*3/uL — ABNORMAL HIGH (ref 150–400)
RBC: 2.68 MIL/uL — ABNORMAL LOW (ref 3.87–5.11)
RDW: 14.8 % (ref 11.5–15.5)
WBC: 10.8 10*3/uL — ABNORMAL HIGH (ref 4.0–10.5)
nRBC: 0 % (ref 0.0–0.2)

## 2018-10-07 MED ORDER — DOCUSATE SODIUM 100 MG PO CAPS
100.0000 mg | ORAL_CAPSULE | Freq: Two times a day (BID) | ORAL | Status: DC
  Administered 2018-10-07 – 2018-10-13 (×13): 100 mg via ORAL
  Filled 2018-10-07 (×13): qty 1

## 2018-10-07 MED ORDER — HYDROMORPHONE HCL 1 MG/ML IJ SOLN
0.5000 mg | INTRAMUSCULAR | Status: AC | PRN
  Administered 2018-10-07 – 2018-10-08 (×3): 0.5 mg via INTRAVENOUS
  Filled 2018-10-07 (×3): qty 1

## 2018-10-07 MED ORDER — POLYETHYLENE GLYCOL 3350 17 G PO PACK
17.0000 g | PACK | Freq: Every day | ORAL | Status: DC
  Administered 2018-10-07 – 2018-10-13 (×7): 17 g via ORAL
  Filled 2018-10-07 (×7): qty 1

## 2018-10-07 NOTE — Progress Notes (Signed)
[facility] Progress Note     Subjective: CC-  Up in chair this morning. Continues to complain of abdominal pain, pain is different than prior to drain x2 placement yesterday. States that she is also sore at drain sites. Denies n/v. Colostomy functioning.   Objective: Vital signs in last 24 hours: Temp:  [97.6 F (36.4 C)-100.3 F (37.9 C)] 97.6 F (36.4 C) (02/12 0617) Pulse Rate:  [83-127] 83 (02/12 0617) Resp:  [17-18] 17 (02/12 0617) BP: (109-140)/(76-91) 109/79 (02/12 0617) SpO2:  [93 %-97 %] 97 % (02/12 0617) Last BM Date: 10/07/18  Intake/Output from previous day: 02/11 0701 - 02/12 0700 In: 2318.6 [P.O.:840; I.V.:1445.3; IV Piggyback:2.3] Out: 2185 [Urine:850; Drains:760; Stool:575] Intake/Output this shift: Total I/O In: 240 [P.O.:240] Out: -   PE: Gen:  Alert, NAD Pulm:  effort normal Abd: Soft, mild diffuse tenderness, +BS, LLQ colostomy pink with some formed brown stool in pouch, midline wound open/dehisced with healthy pink granulation tissue/ visible suture/ no purulent drainage Ext:  No LE edema Skin: warm and dry  Lab Results:  Recent Labs    10/06/18 0252 10/07/18 0311  WBC 13.0* 10.8*  HGB 8.2* 7.5*  HCT 26.8* 24.8*  PLT 434* 455*   BMET Recent Labs    10/05/18 1140 10/06/18 0252  NA 135 136  K 4.3 3.9  CL 100 103  CO2 24 22  GLUCOSE 149* 89  BUN 9 7  CREATININE 1.02* 0.88  CALCIUM 8.5* 7.9*   PT/INR Recent Labs    10/06/18 1151  LABPROT 16.3*  INR 1.33   CMP     Component Value Date/Time   NA 136 10/06/2018 0252   K 3.9 10/06/2018 0252   CL 103 10/06/2018 0252   CO2 22 10/06/2018 0252   GLUCOSE 89 10/06/2018 0252   BUN 7 10/06/2018 0252   CREATININE 0.88 10/06/2018 0252   CREATININE 0.72 04/28/2014 1201   CALCIUM 7.9 (L) 10/06/2018 0252   PROT 6.6 10/01/2018 0258   ALBUMIN 1.8 (L) 10/01/2018 0258   AST 14 (L) 10/01/2018 0258   ALT 10 10/01/2018 0258   ALKPHOS 59 10/01/2018 0258   BILITOT 0.6  10/01/2018 0258   GFRNONAA >60 10/06/2018 0252   GFRNONAA >89 04/28/2014 1201   GFRAA >60 10/06/2018 0252   GFRAA >89 04/28/2014 1201   Lipase     Component Value Date/Time   LIPASE 87 (H) 08/23/2018 1834       Studies/Results: Dg Chest 2 View  Result Date: 10/05/2018 CLINICAL DATA:  Productive cough for 2 days. Tachycardia, weakness and dizziness. EXAM: CHEST - 2 VIEW COMPARISON:  Chest x-rays dated 10/01/2018, 09/07/2018 and 08/25/2018. FINDINGS: Heart size and mediastinal contours are stable. Linear opacities at the bilateral lung bases, atelectasis versus pneumonia. Lungs appear otherwise clear. No pleural effusion or pneumothorax seen. Osseous structures about the chest are unremarkable. IMPRESSION: Bibasilar opacities. These could represent atelectasis, pneumonia or aspiration. Favor atelectasis. Electronically Signed   By: Franki Cabot M.D.   On: 10/05/2018 18:20   Abd 1 View (kub)  Result Date: 10/05/2018 CLINICAL DATA:  Low ostomy output with nausea and vomiting. EXAM: ABDOMEN - 1 VIEW COMPARISON:  None. FINDINGS: LEFT lower quadrant ostomy. Bowel gas pattern appears nonobstructive. No evidence of abnormal fluid collection. No evidence of free intraperitoneal air. IMPRESSION: Nonobstructive bowel gas pattern. Electronically Signed   By: Franki Cabot M.D.   On: 10/05/2018 18:21   [place] Abscess Drain  Result Date: 10/06/2018 INDICATION: 53 year old with history of  bowel perforation and status post diverting colostomy. Complex postoperative course including wound dehiscence and respiratory failure requiring mechanical ventilation. Recent CT demonstrated multiple intra-abdominal fluid collections and concern for intra-abdominal abscesses. Largest fluid collections are in the upper quadrants. EXAM: ULTRASOUND-GUIDED DRAIN PLACEMENT IN THE PERISPLENIC FLUID COLLECTION ULTRASOUND-GUIDED DRAIN PLACEMENT IN THE PERIHEPATIC FLUID COLLECTION MEDICATIONS: No additional antibiotics given for  this procedure. ANESTHESIA/SEDATION: Fentanyl 100 mcg The patient was continuously monitored during the procedure by the interventional radiology nurse under my direct supervision. COMPLICATIONS: None immediate. PROCEDURE: Informed written consent was obtained from the patient after a thorough discussion of the procedural risks, benefits and alternatives. All questions were addressed. A timeout was performed prior to the initiation of the procedure. Left upper abdomen was evaluated with ultrasound. A large amount of perisplenic fluid was identified. The left lateral abdomen was prepped with chlorhexidine and sterile field was created. Skin and soft tissues were anesthetized with 1% lidocaine. Yueh catheter was directed to the perisplenic fluid collection with ultrasound guidance. Thick green/gray colored fluid was aspirated. Stiff Amplatz wire was advanced into the collection. The tract was dilated to accommodate a 10.2 [place] multipurpose drain. 450 mL of the fluid was removed. Majority of the perisplenic fluid collection was decompressed. Catheter was sutured to skin and attached to a suction bulb. Specimen was sent for culture. Attention was directed to the right upper quadrant. Small irregular fluid collections were identified around the liver and between the liver and right kidney. A new sterile tray was opened. The right lateral abdomen was prepped with chlorhexidine and sterile field was created. Skin and soft tissues were anesthetized with 1% lidocaine. Using ultrasound guidance, a new Yueh catheter was directed into the perihepatic fluid collection. Thick greenish fluid was removed and looked similar to the fluid from the left upper quadrant. Stiff Amplatz wire was advanced into the collection. Tract was dilated to accommodate a new 10.2 [place] multipurpose drain. Approximately 100 mL of the fluid was removed from the perihepatic space. The crescent-shaped perihepatic collection near the right hepatic dome  appeared to be decreasing in size following placement of this drain. Catheter was sutured to skin and attached to a suction bulb. Fluid was sent for culture. FINDINGS: Mildly complex fluid in the perihepatic and perisplenic regions. Scattered echogenic foci within these fluid collections. Thick greenish gray fluid was removed from bilateral upper quadrants. 450 mL removed from the left upper quadrant and 130 mL removed from the right upper quadrant. The crescent-shaped fluid collection near the right hepatic dome appeared to be getting smaller in size suggesting there is communication with this collection and the right drain collection. IMPRESSION: Successful placement of bilateral abdominal drains with ultrasound guidance. One drain was placed in the large perisplenic fluid collection and 450 mL of fluid was removed. The other drain was placed in the perihepatic collection and 130 mL of fluid was removed. Fluid samples were sent for culture. Follow output and patient will need follow-up CT imaging to confirm adequate drainage of the upper quadrant fluid collections. Electronically Signed   By: Markus Daft M.D.   On: 10/06/2018 17:38   [place] Abscess Drain  Result Date: 10/06/2018 INDICATION: 54 year old with history of bowel perforation and status post diverting colostomy. Complex postoperative course including wound dehiscence and respiratory failure requiring mechanical ventilation. Recent CT demonstrated multiple intra-abdominal fluid collections and concern for intra-abdominal abscesses. Largest fluid collections are in the upper quadrants. EXAM: ULTRASOUND-GUIDED DRAIN PLACEMENT IN THE PERISPLENIC FLUID COLLECTION ULTRASOUND-GUIDED DRAIN PLACEMENT IN THE  PERIHEPATIC FLUID COLLECTION MEDICATIONS: No additional antibiotics given for this procedure. ANESTHESIA/SEDATION: Fentanyl 100 mcg The patient was continuously monitored during the procedure by the interventional radiology nurse under my direct supervision.  COMPLICATIONS: None immediate. PROCEDURE: Informed written consent was obtained from the patient after a thorough discussion of the procedural risks, benefits and alternatives. All questions were addressed. A timeout was performed prior to the initiation of the procedure. Left upper abdomen was evaluated with ultrasound. A large amount of perisplenic fluid was identified. The left lateral abdomen was prepped with chlorhexidine and sterile field was created. Skin and soft tissues were anesthetized with 1% lidocaine. Yueh catheter was directed to the perisplenic fluid collection with ultrasound guidance. Thick green/gray colored fluid was aspirated. Stiff Amplatz wire was advanced into the collection. The tract was dilated to accommodate a 10.2 [place] multipurpose drain. 450 mL of the fluid was removed. Majority of the perisplenic fluid collection was decompressed. Catheter was sutured to skin and attached to a suction bulb. Specimen was sent for culture. Attention was directed to the right upper quadrant. Small irregular fluid collections were identified around the liver and between the liver and right kidney. A new sterile tray was opened. The right lateral abdomen was prepped with chlorhexidine and sterile field was created. Skin and soft tissues were anesthetized with 1% lidocaine. Using ultrasound guidance, a new Yueh catheter was directed into the perihepatic fluid collection. Thick greenish fluid was removed and looked similar to the fluid from the left upper quadrant. Stiff Amplatz wire was advanced into the collection. Tract was dilated to accommodate a new 10.2 [place] multipurpose drain. Approximately 100 mL of the fluid was removed from the perihepatic space. The crescent-shaped perihepatic collection near the right hepatic dome appeared to be decreasing in size following placement of this drain. Catheter was sutured to skin and attached to a suction bulb. Fluid was sent for culture. FINDINGS: Mildly  complex fluid in the perihepatic and perisplenic regions. Scattered echogenic foci within these fluid collections. Thick greenish gray fluid was removed from bilateral upper quadrants. 450 mL removed from the left upper quadrant and 130 mL removed from the right upper quadrant. The crescent-shaped fluid collection near the right hepatic dome appeared to be getting smaller in size suggesting there is communication with this collection and the right drain collection. IMPRESSION: Successful placement of bilateral abdominal drains with ultrasound guidance. One drain was placed in the large perisplenic fluid collection and 450 mL of fluid was removed. The other drain was placed in the perihepatic collection and 130 mL of fluid was removed. Fluid samples were sent for culture. Follow output and patient will need follow-up CT imaging to confirm adequate drainage of the upper quadrant fluid collections. Electronically Signed   By: Markus Daft M.D.   On: 10/06/2018 17:38   Ct Abdomen Pelvis W Contrast  Result Date: 10/05/2018 CLINICAL DATA:  Fever and leukocytosis. Abdominal pain. History of abscess. Abscess suspected. EXAM: CT ABDOMEN AND PELVIS WITH CONTRAST TECHNIQUE: Multidetector CT imaging of the abdomen and pelvis was performed using the standard protocol following bolus administration of intravenous contrast. CONTRAST:  113mL OMNIPAQUE IOHEXOL 300 MG/ML  SOLN COMPARISON:  Plain films 10/05/2018.  CT of 09/02/2018. FINDINGS: Lower chest: bibasilar airspace disease, somewhat more confluent than on the prior CT. There is also incompletely imaged probable consolidation within the lingula. Trace bilateral pleural effusions, decreased from 09/02/2018. Normal heart size. Hepatobiliary: Moderate hepatic steatosis and mild hepatomegaly at 18.9 cm craniocaudal. Perihepatic fluid collections including a  subcapsular collection along the right hepatic lobe of 9.3 x 2.8 by 6.3 cm on image 16/3. Smaller collection along the  lateral segment left liver lobe is likely extracapsular, including on image 19/3. Normal gallbladder, without biliary ductal dilatation. Pancreas: Normal, without mass or ductal dilatation. Spleen: Perisplenic loculated collection superiorly measures 10.7 x 9.4 by 13.9 cm and is increased from 9.9 x 7.9 cm on the prior exam. Adrenals/Urinary Tract: Normal adrenal glands. Left extrarenal pelvis. Normal right kidney. Normal urinary bladder. Stomach/Bowel: The proximal stomach is contracted. Apparent wall thickening is likely secondary. Hartmann's pouch and descending colostomy. Normal terminal ileum. Normal small bowel caliber. Vascular/Lymphatic: Aortic and branch vessel atherosclerosis. Multiple small retroperitoneal nodes are not pathologic by size criteria and likely reactive. No pelvic sidewall adenopathy. Reproductive: Normal uterus and adnexa. Other: No significant free fluid. No free intraperitoneal air. Small volume relatively ill-defined small bowel mesenteric fluid at 3.2 cm on image 50/3. Midline open abdominal laparotomy site. Musculoskeletal: Degenerate disc disease at the lumbosacral junction IMPRESSION: 1. Multiple loculated fluid collections, suspicious for infected ascites or developing abscesses. 2. Status post left lower quadrant colostomy and Hartmann's pouch, without obstruction. 3. Persistent bibasilar pneumonia with probable confluent infection within the lingula, incompletely imaged. Decreased small bilateral pleural effusions. 4. Hepatic steatosis and hepatomegaly. These results will be called to the ordering clinician or representative by the Radiologist Assistant, and communication documented in the PACS or zVision Dashboard. Electronically Signed   By: Abigail Miyamoto M.D.   On: 10/05/2018 21:44    Anti-infectives: Anti-infectives (From admission, onward)   Start     Dose/Rate Route Frequency Ordered Stop   10/07/18 0300  piperacillin-tazobactam (ZOSYN) IVPB 3.375 g     3.375 g 12.5  mL/hr over 240 Minutes Intravenous Every 8 hours 10/06/18 2017     10/05/18 2300  piperacillin-tazobactam (ZOSYN) IVPB 3.375 g  Status:  Discontinued     3.375 g 12.5 mL/hr over 240 Minutes Intravenous Every 8 hours 10/05/18 2256 10/06/18 2017       Assessment/Plan Depression GERD HTN ?Pneumonia  S/p ex lap with Hartman's for rectal perforation at [facility] with wound dehiscence -mepitel over wound then NS WD dressing changes BID -will need outpatient follow up with surgeon in [facility]  Intra-abdominal fluid collections  -s/p IR drain placement x2 2/11, flush TID per IR -cultures NGTD, continue to follow  FEN - regular diet VTE - sq Heparin ID - zosyn 2/10>>  Plan: Continue drains and IV antibiotics, follow cultures.    LOS: 2 days    Wellington Hampshire , [facility] 10/07/2018, 11:18 AM Pager: [phone] Mon-Thurs 7:00 am-4:30 pm Fri 7:00 am -11:30 AM Sat-Sun 7:00 am-11:30 am

## 2018-10-07 NOTE — Progress Notes (Signed)
   Subjective: Feeling ok this morning. Abdominal pain is stably/slightly worsened around drain placements. Cough is improved. Denies chest pain, n/v. Reports good output from her colostomy. Discussed the plan to continue antibiotics and follow up culture results.   Objective:  Vital signs in last 24 hours: Vitals:   10/06/18 0907 10/06/18 1431 10/06/18 2056 10/07/18 0617  BP: 108/72 (!) 140/91 118/76 109/79  Pulse: (!) 125  (!) 127 83  Resp: (!) 24  18 17   Temp: 98.6 F (37 C)  100.3 F (37.9 C) 97.6 F (36.4 C)  TempSrc: Oral  Oral Oral  SpO2: 95%  93% 97%   Gen: sitting up in bed, NAD Abd: LUQ JP drain with minimal bloody output, RUQ drain with minimal serosangious output. Abdomen is less TTP but remains TTP in outer quadrants. Dressing over midline wound remains clean and dry  Pulm: CTAB Cardiac: RRR, no m/r/g  Assessment/Plan:  Active Problems:   Intractable nausea and vomiting   52yoF with recent hospital admission (12/19-1/24) for rectal perforation s/p colostomy complicated by bacteremia, ARDS, aspiration pneumonia, and acute blood loss anemia. Discharged to rehab facility and then admitted 2/5-2/6 for symptomatic anemia Hgb 6.0, given 2U pRBCs and discharged home with [facility] who now presents with one day of sob, cough productive of clear mucous, dry heaving, chest congestion, subjective fevers, sore throat, and diaphoresis. Initial eval pertinent for tachycardia HR 154, crackles at the left base, and leukocytosis 16 with left shift.   Intraabdominal Abscess: JP drains placed yesterday around the hepatic and splenic abscess with 130cc and 450cc of thick green/gray foul smelling fluid removed, respectively. No organisms seen on gram stain. Will f/u culture. Remains on zosyn. Abdominal pain is improving. Leukocytosis continues to improve, she had an elevated temp of 100.69F overnight but remains afebrile this morning.  - appreciate general surgery's assistance: outpt f/u, no  surgical indication at this time.  - appreciate IR's assistance: may need f/u abd CT to confirm RUQ drain placement since output has been low - continue IV zosyn  - abdominal wound dressing changes bid    Depression: continue home venlafaxine  GERD: home pantoprazole HTN: home amlodipine   Dispo: Anticipated discharge pending fluid cultures   Isabelle Course, MD 10/07/2018, 10:10 AM Pager: [phone]

## 2018-10-07 NOTE — Plan of Care (Signed)

## 2018-10-07 NOTE — Progress Notes (Signed)
Pt crying out c/o 10/10 pain at drain sites.  Drains flushed and emptied and MD notified.  New order received.  Will give pain medication and continue to monitor.  Eliezer Bottom [place]

## 2018-10-07 NOTE — Progress Notes (Signed)
Internal Medicine Clinic Attending  I saw and evaluated the patient.  I personally confirmed the key portions of the history and exam documented by Dr. Prince and I reviewed pertinent patient test results.  The assessment, diagnosis, and plan were formulated together and I agree with the documentation in the resident's note.  

## 2018-10-07 NOTE — Progress Notes (Signed)
Referring Physician(s): Dr. Redmond Pulling  Supervising Physician: Jacqulynn Cadet  Patient Status:  [facility] - In-pt  Chief Complaint: Intraabdominal fluid collection s/p drain placement x2 10/06/18  Subjective: "Can we get these drains out."  Allergies: Bee venom  Medications: Prior to Admission medications   Medication Sig Start Date End Date Taking? Authorizing Provider  guaiFENesin (ROBITUSSIN) 100 MG/5ML SOLN Take 30 mLs (600 mg total) by mouth every 6 (six) hours. 09/11/18  Yes [name], MD  morphine (MSIR) 30 MG tablet Take 30 mg by mouth every 4 (four) hours as needed for severe pain.   Yes [provider]  omeprazole (PRILOSEC) 40 MG capsule Take 1 capsule (40 mg total) by mouth daily. 08/14/18  Yes Hoffman, Jessica Ratliff, DO  QUEtiapine (SEROQUEL) 25 MG tablet Take 1 tablet (25 mg total) by mouth at bedtime. 09/11/18  Yes [name], MD  acetaminophen (TYLENOL) 160 MG/5ML solution Place 20.3 mLs (650 mg total) into feeding tube every 6 (six) hours as needed for fever. Patient not taking: Reported on 10/06/2018 09/11/18   [name], MD  albuterol (PROVENTIL HFA;VENTOLIN HFA) 108 [phone] Base) MCG/ACT inhaler Inhale 1-2 puffs into the lungs every 6 (six) hours as needed for wheezing or shortness of breath. Patient not taking: Reported on 10/06/2018 04/07/18   Kalman Shan Ratliff, DO  amLODipine (NORVASC) 5 MG tablet Take 1 tablet (5 mg total) by mouth daily. Patient not taking: Reported on 10/06/2018 09/12/18   [name], MD  calcium carbonate (TUMS - DOSED IN MG ELEMENTAL CALCIUM) 500 MG chewable tablet Chew 2 tablets (400 mg of elemental calcium total) by mouth 2 (two) times daily as needed for indigestion or heartburn. Patient not taking: Reported on 10/06/2018 09/11/18   [name], MD  Chlorhexidine Gluconate Cloth 2 % PADS Apply 6 each topically daily. Patient not taking: Reported on 10/06/2018 09/12/18   [name], MD  heparin 5000 UNIT/ML  injection Inject 1 mL (5,000 Units total) into the skin every 8 (eight) hours. Patient not taking: Reported on 10/06/2018 09/11/18   [name], MD  labetalol (NORMODYNE) 100 MG tablet Take 1 tablet (100 mg total) by mouth 2 (two) times daily. Patient not taking: Reported on 10/06/2018 09/11/18   [name], MD  Maltodextrin-Xanthan Gum (Loco) POWD Take 1 Container by mouth as needed. Patient not taking: Reported on 10/06/2018 09/11/18   [name], MD  morphine (MSIR) 15 MG tablet Take 1 tablet (15 mg total) by mouth 3 (three) times daily for 7 days. Patient not taking: Reported on 10/06/2018 09/11/18 10/05/18  [name], MD  mouth rinse LIQD solution 15 mLs by Mouth Rinse route 3 (three) times daily. Patient not taking: Reported on 10/06/2018 09/11/18   [name], MD  ondansetron [facility]) 4 MG/2ML SOLN injection Inject 2 mLs (4 mg total) into the vein every 6 (six) hours as needed for nausea or vomiting. Patient not taking: Reported on 10/06/2018 09/11/18   [name], MD  polyethylene glycol powder (GLYCOLAX/MIRALAX) powder DISSOLVE 255 GRAMS INTO LIQUID AND TAKE BY MOUTH ONCE. Patient not taking: Reported on 10/06/2018 09/18/17   Kalman Shan Ratliff, DO  venlafaxine XR (EFFEXOR-XR) 37.5 MG 24 hr capsule Take 1 capsule (37.5 mg total) by mouth daily with breakfast. Patient not taking: Reported on 10/06/2018 08/14/18   Valinda Party, DO     Vital Signs: BP 109/79 (BP Location: Right Arm)   Pulse  83   Temp 97.6 F (36.4 C) (Oral)   Resp 17   SpO2 97%   Physical Exam  NAD, lying in bed Abdomen: mild distention.  Colostomy in place.  RUQ drain in place.  Insertion site c/d/i.  Flushes easily. Output bloody, purulent. LUQ drain in place.  Insertion site intact, small amount of dried blood. Purulent output.  Flushes easily.   Imaging: Dg Chest 2 View  Result Date: 10/05/2018 CLINICAL DATA:  Productive cough for 2 days. Tachycardia,  weakness and dizziness. EXAM: CHEST - 2 VIEW COMPARISON:  Chest x-rays dated 10/01/2018, 09/07/2018 and 08/25/2018. FINDINGS: Heart size and mediastinal contours are stable. Linear opacities at the bilateral lung bases, atelectasis versus pneumonia. Lungs appear otherwise clear. No pleural effusion or pneumothorax seen. Osseous structures about the chest are unremarkable. IMPRESSION: Bibasilar opacities. These could represent atelectasis, pneumonia or aspiration. Favor atelectasis. Electronically Signed   By: Franki Cabot M.D.   On: 10/05/2018 18:20   Abd 1 View (kub)  Result Date: 10/05/2018 CLINICAL DATA:  Low ostomy output with nausea and vomiting. EXAM: ABDOMEN - 1 VIEW COMPARISON:  None. FINDINGS: LEFT lower quadrant ostomy. Bowel gas pattern appears nonobstructive. No evidence of abnormal fluid collection. No evidence of free intraperitoneal air. IMPRESSION: Nonobstructive bowel gas pattern. Electronically Signed   By: Franki Cabot M.D.   On: 10/05/2018 18:21   [place] Abscess Drain  Result Date: 10/06/2018 INDICATION: 53 year old with history of bowel perforation and status post diverting colostomy. Complex postoperative course including wound dehiscence and respiratory failure requiring mechanical ventilation. Recent CT demonstrated multiple intra-abdominal fluid collections and concern for intra-abdominal abscesses. Largest fluid collections are in the upper quadrants. EXAM: ULTRASOUND-GUIDED DRAIN PLACEMENT IN THE PERISPLENIC FLUID COLLECTION ULTRASOUND-GUIDED DRAIN PLACEMENT IN THE PERIHEPATIC FLUID COLLECTION MEDICATIONS: No additional antibiotics given for this procedure. ANESTHESIA/SEDATION: Fentanyl 100 mcg The patient was continuously monitored during the procedure by the interventional radiology nurse under my direct supervision. COMPLICATIONS: None immediate. PROCEDURE: Informed written consent was obtained from the patient after a thorough discussion of the procedural risks, benefits and  alternatives. All questions were addressed. A timeout was performed prior to the initiation of the procedure. Left upper abdomen was evaluated with ultrasound. A large amount of perisplenic fluid was identified. The left lateral abdomen was prepped with chlorhexidine and sterile field was created. Skin and soft tissues were anesthetized with 1% lidocaine. Yueh catheter was directed to the perisplenic fluid collection with ultrasound guidance. Thick green/gray colored fluid was aspirated. Stiff Amplatz wire was advanced into the collection. The tract was dilated to accommodate a 10.2 [place] multipurpose drain. 450 mL of the fluid was removed. Majority of the perisplenic fluid collection was decompressed. Catheter was sutured to skin and attached to a suction bulb. Specimen was sent for culture. Attention was directed to the right upper quadrant. Small irregular fluid collections were identified around the liver and between the liver and right kidney. A new sterile tray was opened. The right lateral abdomen was prepped with chlorhexidine and sterile field was created. Skin and soft tissues were anesthetized with 1% lidocaine. Using ultrasound guidance, a new Yueh catheter was directed into the perihepatic fluid collection. Thick greenish fluid was removed and looked similar to the fluid from the left upper quadrant. Stiff Amplatz wire was advanced into the collection. Tract was dilated to accommodate a new 10.2 [place] multipurpose drain. Approximately 100 mL of the fluid was removed from the perihepatic space. The crescent-shaped perihepatic collection near the right hepatic dome appeared  to be decreasing in size following placement of this drain. Catheter was sutured to skin and attached to a suction bulb. Fluid was sent for culture. FINDINGS: Mildly complex fluid in the perihepatic and perisplenic regions. Scattered echogenic foci within these fluid collections. Thick greenish gray fluid was removed from bilateral  upper quadrants. 450 mL removed from the left upper quadrant and 130 mL removed from the right upper quadrant. The crescent-shaped fluid collection near the right hepatic dome appeared to be getting smaller in size suggesting there is communication with this collection and the right drain collection. IMPRESSION: Successful placement of bilateral abdominal drains with ultrasound guidance. One drain was placed in the large perisplenic fluid collection and 450 mL of fluid was removed. The other drain was placed in the perihepatic collection and 130 mL of fluid was removed. Fluid samples were sent for culture. Follow output and patient will need follow-up CT imaging to confirm adequate drainage of the upper quadrant fluid collections. Electronically Signed   By: Markus Daft M.D.   On: 10/06/2018 17:38   [place] Abscess Drain  Result Date: 10/06/2018 INDICATION: 53 year old with history of bowel perforation and status post diverting colostomy. Complex postoperative course including wound dehiscence and respiratory failure requiring mechanical ventilation. Recent CT demonstrated multiple intra-abdominal fluid collections and concern for intra-abdominal abscesses. Largest fluid collections are in the upper quadrants. EXAM: ULTRASOUND-GUIDED DRAIN PLACEMENT IN THE PERISPLENIC FLUID COLLECTION ULTRASOUND-GUIDED DRAIN PLACEMENT IN THE PERIHEPATIC FLUID COLLECTION MEDICATIONS: No additional antibiotics given for this procedure. ANESTHESIA/SEDATION: Fentanyl 100 mcg The patient was continuously monitored during the procedure by the interventional radiology nurse under my direct supervision. COMPLICATIONS: None immediate. PROCEDURE: Informed written consent was obtained from the patient after a thorough discussion of the procedural risks, benefits and alternatives. All questions were addressed. A timeout was performed prior to the initiation of the procedure. Left upper abdomen was evaluated with ultrasound. A large amount of  perisplenic fluid was identified. The left lateral abdomen was prepped with chlorhexidine and sterile field was created. Skin and soft tissues were anesthetized with 1% lidocaine. Yueh catheter was directed to the perisplenic fluid collection with ultrasound guidance. Thick green/gray colored fluid was aspirated. Stiff Amplatz wire was advanced into the collection. The tract was dilated to accommodate a 10.2 [place] multipurpose drain. 450 mL of the fluid was removed. Majority of the perisplenic fluid collection was decompressed. Catheter was sutured to skin and attached to a suction bulb. Specimen was sent for culture. Attention was directed to the right upper quadrant. Small irregular fluid collections were identified around the liver and between the liver and right kidney. A new sterile tray was opened. The right lateral abdomen was prepped with chlorhexidine and sterile field was created. Skin and soft tissues were anesthetized with 1% lidocaine. Using ultrasound guidance, a new Yueh catheter was directed into the perihepatic fluid collection. Thick greenish fluid was removed and looked similar to the fluid from the left upper quadrant. Stiff Amplatz wire was advanced into the collection. Tract was dilated to accommodate a new 10.2 [place] multipurpose drain. Approximately 100 mL of the fluid was removed from the perihepatic space. The crescent-shaped perihepatic collection near the right hepatic dome appeared to be decreasing in size following placement of this drain. Catheter was sutured to skin and attached to a suction bulb. Fluid was sent for culture. FINDINGS: Mildly complex fluid in the perihepatic and perisplenic regions. Scattered echogenic foci within these fluid collections. Thick greenish gray fluid was removed from bilateral upper quadrants.  450 mL removed from the left upper quadrant and 130 mL removed from the right upper quadrant. The crescent-shaped fluid collection near the right hepatic dome  appeared to be getting smaller in size suggesting there is communication with this collection and the right drain collection. IMPRESSION: Successful placement of bilateral abdominal drains with ultrasound guidance. One drain was placed in the large perisplenic fluid collection and 450 mL of fluid was removed. The other drain was placed in the perihepatic collection and 130 mL of fluid was removed. Fluid samples were sent for culture. Follow output and patient will need follow-up CT imaging to confirm adequate drainage of the upper quadrant fluid collections. Electronically Signed   By: Markus Daft M.D.   On: 10/06/2018 17:38   Ct Abdomen Pelvis W Contrast  Result Date: 10/05/2018 CLINICAL DATA:  Fever and leukocytosis. Abdominal pain. History of abscess. Abscess suspected. EXAM: CT ABDOMEN AND PELVIS WITH CONTRAST TECHNIQUE: Multidetector CT imaging of the abdomen and pelvis was performed using the standard protocol following bolus administration of intravenous contrast. CONTRAST:  134mL OMNIPAQUE IOHEXOL 300 MG/ML  SOLN COMPARISON:  Plain films 10/05/2018.  CT of 09/02/2018. FINDINGS: Lower chest: bibasilar airspace disease, somewhat more confluent than on the prior CT. There is also incompletely imaged probable consolidation within the lingula. Trace bilateral pleural effusions, decreased from 09/02/2018. Normal heart size. Hepatobiliary: Moderate hepatic steatosis and mild hepatomegaly at 18.9 cm craniocaudal. Perihepatic fluid collections including a subcapsular collection along the right hepatic lobe of 9.3 x 2.8 by 6.3 cm on image 16/3. Smaller collection along the lateral segment left liver lobe is likely extracapsular, including on image 19/3. Normal gallbladder, without biliary ductal dilatation. Pancreas: Normal, without mass or ductal dilatation. Spleen: Perisplenic loculated collection superiorly measures 10.7 x 9.4 by 13.9 cm and is increased from 9.9 x 7.9 cm on the prior exam. Adrenals/Urinary  Tract: Normal adrenal glands. Left extrarenal pelvis. Normal right kidney. Normal urinary bladder. Stomach/Bowel: The proximal stomach is contracted. Apparent wall thickening is likely secondary. Hartmann's pouch and descending colostomy. Normal terminal ileum. Normal small bowel caliber. Vascular/Lymphatic: Aortic and branch vessel atherosclerosis. Multiple small retroperitoneal nodes are not pathologic by size criteria and likely reactive. No pelvic sidewall adenopathy. Reproductive: Normal uterus and adnexa. Other: No significant free fluid. No free intraperitoneal air. Small volume relatively ill-defined small bowel mesenteric fluid at 3.2 cm on image 50/3. Midline open abdominal laparotomy site. Musculoskeletal: Degenerate disc disease at the lumbosacral junction IMPRESSION: 1. Multiple loculated fluid collections, suspicious for infected ascites or developing abscesses. 2. Status post left lower quadrant colostomy and Hartmann's pouch, without obstruction. 3. Persistent bibasilar pneumonia with probable confluent infection within the lingula, incompletely imaged. Decreased small bilateral pleural effusions. 4. Hepatic steatosis and hepatomegaly. These results will be called to the ordering clinician or representative by the Radiologist Assistant, and communication documented in the PACS or zVision Dashboard. Electronically Signed   By: Abigail Miyamoto M.D.   On: 10/05/2018 21:44    Labs:  CBC: Recent Labs    10/01/18 0258 10/01/18 1014 10/05/18 1140 10/06/18 0252 10/07/18 0311  WBC 10.5  --  16.1* 13.0* 10.8*  HGB 7.5* 9.4* 9.9* 8.2* 7.5*  HCT 22.9* 29.7* 32.5* 26.8* 24.8*  PLT 439*  --  530* 434* 455*    COAGS: Recent Labs    08/23/18 1834 10/06/18 1151  INR 1.15 1.33  APTT 31  --     BMP: Recent Labs    09/30/18 2130 10/01/18 0258 10/05/18 1140 10/06/18 0252  NA 133* 134* 135 136  K 4.0 3.6 4.3 3.9  CL 103 103 100 103  CO2 23 19* 24 22  GLUCOSE 103* 100* 149* 89  BUN 17  14 9 7   CALCIUM 8.3* 8.2* 8.5* 7.9*  CREATININE 1.09* 0.97 1.02* 0.88  GFRNONAA 58* >60 >60 >60  GFRAA >60 >60 >60 >60    LIVER FUNCTION TESTS: Recent Labs    08/24/18 0240 08/27/18 0415 08/31/18 0500  09/11/18 0616 09/12/18 0608 09/13/18 0653 10/01/18 0258  BILITOT 1.2 0.6 0.7  --   --   --   --  0.6  AST 123* 38 21  --   --   --   --  14*  ALT 81* 31 13  --   --   --   --  10  ALKPHOS 48 54 60  --   --   --   --  59  PROT 5.3* 5.7* 5.9*  --   --   --   --  6.6  ALBUMIN 1.7* 1.3* 1.1*   < > 1.5* 1.5* 1.5* 1.8*   < > = values in this interval not displayed.    Assessment and Plan: Intra-abdominal abscesses s/p drain placement x2 10/06/18 RUQ drain remains in place and continues to drain purulent-appearing material.  Will need repeat imaging if output slows to confirm RUQ drain in decompressing all the perihepatic collections.  LUQ remains in place with ongoing significant output; 180 mL since placement.  WBC improving.  Cultures with NG in 24 hrs. Continues Zosyn. Continue with TID flushes.   Electronically Signed: Docia Barrier, PA 10/07/2018, 1:16 PM   I spent a total of 15 Minutes at the the patient's bedside AND on the patient's hospital floor or unit, greater than 50% of which was counseling/coordinating care for intra-abdominal abscesses

## 2018-10-08 LAB — CBC
HEMATOCRIT: 28.2 % — AB (ref 36.0–46.0)
Hemoglobin: 8.3 g/dL — ABNORMAL LOW (ref 12.0–15.0)
MCH: 27.9 pg (ref 26.0–34.0)
MCHC: 29.4 g/dL — ABNORMAL LOW (ref 30.0–36.0)
MCV: 94.9 fL (ref 80.0–100.0)
Platelets: 500 10*3/uL — ABNORMAL HIGH (ref 150–400)
RBC: 2.97 MIL/uL — ABNORMAL LOW (ref 3.87–5.11)
RDW: 15 % (ref 11.5–15.5)
WBC: 12.1 10*3/uL — ABNORMAL HIGH (ref 4.0–10.5)
nRBC: 0 % (ref 0.0–0.2)

## 2018-10-08 MED ORDER — HYDROMORPHONE HCL 1 MG/ML IJ SOLN
1.0000 mg | INTRAMUSCULAR | Status: DC | PRN
  Administered 2018-10-08 – 2018-10-12 (×22): 1 mg via INTRAVENOUS
  Filled 2018-10-08 (×22): qty 1

## 2018-10-08 MED ORDER — HYDROMORPHONE HCL 1 MG/ML IJ SOLN
0.5000 mg | INTRAMUSCULAR | Status: DC | PRN
  Administered 2018-10-08: 0.5 mg via INTRAVENOUS
  Filled 2018-10-08: qty 1

## 2018-10-08 NOTE — Progress Notes (Signed)
While flushing patient's JP drain, she stated she has been emptying them out on her own. She stated it looked like there was probably 8ML output in the right and nothing other than the flush amount in the left. Educated her on the importance of measuring output and letting nursing staff know/ empty drains themselves.

## 2018-10-08 NOTE — Progress Notes (Signed)
Referring Physician(s): Dr. Redmond Pulling  Supervising Physician: Markus Daft  Patient Status:  [facility] - In-pt  Chief Complaint: Intraabdominal fluid collection s/p drain placement x2 10/06/18  Subjective: Ambulating at time of visit.  No complaints.   Allergies: Bee venom  Medications: Prior to Admission medications   Medication Sig Start Date End Date Taking? Authorizing Provider  guaiFENesin (ROBITUSSIN) 100 MG/5ML SOLN Take 30 mLs (600 mg total) by mouth every 6 (six) hours. 09/11/18  Yes [name], MD  morphine (MSIR) 30 MG tablet Take 30 mg by mouth every 4 (four) hours as needed for severe pain.   Yes [provider]  omeprazole (PRILOSEC) 40 MG capsule Take 1 capsule (40 mg total) by mouth daily. 08/14/18  Yes Hoffman, Jessica Ratliff, DO  QUEtiapine (SEROQUEL) 25 MG tablet Take 1 tablet (25 mg total) by mouth at bedtime. 09/11/18  Yes [name], MD  acetaminophen (TYLENOL) 160 MG/5ML solution Place 20.3 mLs (650 mg total) into feeding tube every 6 (six) hours as needed for fever. Patient not taking: Reported on 10/06/2018 09/11/18   [name], MD  albuterol (PROVENTIL HFA;VENTOLIN HFA) 108 [phone] Base) MCG/ACT inhaler Inhale 1-2 puffs into the lungs every 6 (six) hours as needed for wheezing or shortness of breath. Patient not taking: Reported on 10/06/2018 04/07/18   Kalman Shan Ratliff, DO  amLODipine (NORVASC) 5 MG tablet Take 1 tablet (5 mg total) by mouth daily. Patient not taking: Reported on 10/06/2018 09/12/18   [name], MD  calcium carbonate (TUMS - DOSED IN MG ELEMENTAL CALCIUM) 500 MG chewable tablet Chew 2 tablets (400 mg of elemental calcium total) by mouth 2 (two) times daily as needed for indigestion or heartburn. Patient not taking: Reported on 10/06/2018 09/11/18   [name], MD  Chlorhexidine Gluconate Cloth 2 % PADS Apply 6 each topically daily. Patient not taking: Reported on 10/06/2018 09/12/18   [name], MD  heparin 5000  UNIT/ML injection Inject 1 mL (5,000 Units total) into the skin every 8 (eight) hours. Patient not taking: Reported on 10/06/2018 09/11/18   [name], MD  labetalol (NORMODYNE) 100 MG tablet Take 1 tablet (100 mg total) by mouth 2 (two) times daily. Patient not taking: Reported on 10/06/2018 09/11/18   [name], MD  Maltodextrin-Xanthan Gum (Quentin) POWD Take 1 Container by mouth as needed. Patient not taking: Reported on 10/06/2018 09/11/18   [name], MD  morphine (MSIR) 15 MG tablet Take 1 tablet (15 mg total) by mouth 3 (three) times daily for 7 days. Patient not taking: Reported on 10/06/2018 09/11/18 10/05/18  [name], MD  mouth rinse LIQD solution 15 mLs by Mouth Rinse route 3 (three) times daily. Patient not taking: Reported on 10/06/2018 09/11/18   [name], MD  ondansetron [facility]) 4 MG/2ML SOLN injection Inject 2 mLs (4 mg total) into the vein every 6 (six) hours as needed for nausea or vomiting. Patient not taking: Reported on 10/06/2018 09/11/18   [name], MD  polyethylene glycol powder (GLYCOLAX/MIRALAX) powder DISSOLVE 255 GRAMS INTO LIQUID AND TAKE BY MOUTH ONCE. Patient not taking: Reported on 10/06/2018 09/18/17   Kalman Shan Ratliff, DO  venlafaxine XR (EFFEXOR-XR) 37.5 MG 24 hr capsule Take 1 capsule (37.5 mg total) by mouth daily with breakfast. Patient not taking: Reported on 10/06/2018 08/14/18   Valinda Party, DO     Vital Signs: BP 120/81 (BP Location: Right Arm)  Pulse (!) 116   Temp 99 F (37.2 C) (Oral)   Resp 16   SpO2 92%   Physical Exam  NAD, sitting on side of bed Abdomen: soft.  Colostomy in place.  RUQ drain in place.  Insertion site c/d/i.  Purulent output LUQ drain in place.  Insertion site intact, small amount of dried blood. Blood-tinged, thick output.   Imaging: Dg Chest 2 View  Result Date: 10/05/2018 CLINICAL DATA:  Productive cough for 2 days. Tachycardia, weakness and  dizziness. EXAM: CHEST - 2 VIEW COMPARISON:  Chest x-rays dated 10/01/2018, 09/07/2018 and 08/25/2018. FINDINGS: Heart size and mediastinal contours are stable. Linear opacities at the bilateral lung bases, atelectasis versus pneumonia. Lungs appear otherwise clear. No pleural effusion or pneumothorax seen. Osseous structures about the chest are unremarkable. IMPRESSION: Bibasilar opacities. These could represent atelectasis, pneumonia or aspiration. Favor atelectasis. Electronically Signed   By: Franki Cabot M.D.   On: 10/05/2018 18:20   Abd 1 View (kub)  Result Date: 10/05/2018 CLINICAL DATA:  Low ostomy output with nausea and vomiting. EXAM: ABDOMEN - 1 VIEW COMPARISON:  None. FINDINGS: LEFT lower quadrant ostomy. Bowel gas pattern appears nonobstructive. No evidence of abnormal fluid collection. No evidence of free intraperitoneal air. IMPRESSION: Nonobstructive bowel gas pattern. Electronically Signed   By: Franki Cabot M.D.   On: 10/05/2018 18:21   [place] Abscess Drain  Result Date: 10/06/2018 INDICATION: 53 year old with history of bowel perforation and status post diverting colostomy. Complex postoperative course including wound dehiscence and respiratory failure requiring mechanical ventilation. Recent CT demonstrated multiple intra-abdominal fluid collections and concern for intra-abdominal abscesses. Largest fluid collections are in the upper quadrants. EXAM: ULTRASOUND-GUIDED DRAIN PLACEMENT IN THE PERISPLENIC FLUID COLLECTION ULTRASOUND-GUIDED DRAIN PLACEMENT IN THE PERIHEPATIC FLUID COLLECTION MEDICATIONS: No additional antibiotics given for this procedure. ANESTHESIA/SEDATION: Fentanyl 100 mcg The patient was continuously monitored during the procedure by the interventional radiology nurse under my direct supervision. COMPLICATIONS: None immediate. PROCEDURE: Informed written consent was obtained from the patient after a thorough discussion of the procedural risks, benefits and alternatives. All  questions were addressed. A timeout was performed prior to the initiation of the procedure. Left upper abdomen was evaluated with ultrasound. A large amount of perisplenic fluid was identified. The left lateral abdomen was prepped with chlorhexidine and sterile field was created. Skin and soft tissues were anesthetized with 1% lidocaine. Yueh catheter was directed to the perisplenic fluid collection with ultrasound guidance. Thick green/gray colored fluid was aspirated. Stiff Amplatz wire was advanced into the collection. The tract was dilated to accommodate a 10.2 [place] multipurpose drain. 450 mL of the fluid was removed. Majority of the perisplenic fluid collection was decompressed. Catheter was sutured to skin and attached to a suction bulb. Specimen was sent for culture. Attention was directed to the right upper quadrant. Small irregular fluid collections were identified around the liver and between the liver and right kidney. A new sterile tray was opened. The right lateral abdomen was prepped with chlorhexidine and sterile field was created. Skin and soft tissues were anesthetized with 1% lidocaine. Using ultrasound guidance, a new Yueh catheter was directed into the perihepatic fluid collection. Thick greenish fluid was removed and looked similar to the fluid from the left upper quadrant. Stiff Amplatz wire was advanced into the collection. Tract was dilated to accommodate a new 10.2 [place] multipurpose drain. Approximately 100 mL of the fluid was removed from the perihepatic space. The crescent-shaped perihepatic collection near the right hepatic dome appeared to be  decreasing in size following placement of this drain. Catheter was sutured to skin and attached to a suction bulb. Fluid was sent for culture. FINDINGS: Mildly complex fluid in the perihepatic and perisplenic regions. Scattered echogenic foci within these fluid collections. Thick greenish gray fluid was removed from bilateral upper quadrants.  450 mL removed from the left upper quadrant and 130 mL removed from the right upper quadrant. The crescent-shaped fluid collection near the right hepatic dome appeared to be getting smaller in size suggesting there is communication with this collection and the right drain collection. IMPRESSION: Successful placement of bilateral abdominal drains with ultrasound guidance. One drain was placed in the large perisplenic fluid collection and 450 mL of fluid was removed. The other drain was placed in the perihepatic collection and 130 mL of fluid was removed. Fluid samples were sent for culture. Follow output and patient will need follow-up CT imaging to confirm adequate drainage of the upper quadrant fluid collections. Electronically Signed   By: Markus Daft M.D.   On: 10/06/2018 17:38   [place] Abscess Drain  Result Date: 10/06/2018 INDICATION: 53 year old with history of bowel perforation and status post diverting colostomy. Complex postoperative course including wound dehiscence and respiratory failure requiring mechanical ventilation. Recent CT demonstrated multiple intra-abdominal fluid collections and concern for intra-abdominal abscesses. Largest fluid collections are in the upper quadrants. EXAM: ULTRASOUND-GUIDED DRAIN PLACEMENT IN THE PERISPLENIC FLUID COLLECTION ULTRASOUND-GUIDED DRAIN PLACEMENT IN THE PERIHEPATIC FLUID COLLECTION MEDICATIONS: No additional antibiotics given for this procedure. ANESTHESIA/SEDATION: Fentanyl 100 mcg The patient was continuously monitored during the procedure by the interventional radiology nurse under my direct supervision. COMPLICATIONS: None immediate. PROCEDURE: Informed written consent was obtained from the patient after a thorough discussion of the procedural risks, benefits and alternatives. All questions were addressed. A timeout was performed prior to the initiation of the procedure. Left upper abdomen was evaluated with ultrasound. A large amount of perisplenic fluid was  identified. The left lateral abdomen was prepped with chlorhexidine and sterile field was created. Skin and soft tissues were anesthetized with 1% lidocaine. Yueh catheter was directed to the perisplenic fluid collection with ultrasound guidance. Thick green/gray colored fluid was aspirated. Stiff Amplatz wire was advanced into the collection. The tract was dilated to accommodate a 10.2 [place] multipurpose drain. 450 mL of the fluid was removed. Majority of the perisplenic fluid collection was decompressed. Catheter was sutured to skin and attached to a suction bulb. Specimen was sent for culture. Attention was directed to the right upper quadrant. Small irregular fluid collections were identified around the liver and between the liver and right kidney. A new sterile tray was opened. The right lateral abdomen was prepped with chlorhexidine and sterile field was created. Skin and soft tissues were anesthetized with 1% lidocaine. Using ultrasound guidance, a new Yueh catheter was directed into the perihepatic fluid collection. Thick greenish fluid was removed and looked similar to the fluid from the left upper quadrant. Stiff Amplatz wire was advanced into the collection. Tract was dilated to accommodate a new 10.2 [place] multipurpose drain. Approximately 100 mL of the fluid was removed from the perihepatic space. The crescent-shaped perihepatic collection near the right hepatic dome appeared to be decreasing in size following placement of this drain. Catheter was sutured to skin and attached to a suction bulb. Fluid was sent for culture. FINDINGS: Mildly complex fluid in the perihepatic and perisplenic regions. Scattered echogenic foci within these fluid collections. Thick greenish gray fluid was removed from bilateral upper quadrants. 450 mL  removed from the left upper quadrant and 130 mL removed from the right upper quadrant. The crescent-shaped fluid collection near the right hepatic dome appeared to be getting  smaller in size suggesting there is communication with this collection and the right drain collection. IMPRESSION: Successful placement of bilateral abdominal drains with ultrasound guidance. One drain was placed in the large perisplenic fluid collection and 450 mL of fluid was removed. The other drain was placed in the perihepatic collection and 130 mL of fluid was removed. Fluid samples were sent for culture. Follow output and patient will need follow-up CT imaging to confirm adequate drainage of the upper quadrant fluid collections. Electronically Signed   By: Markus Daft M.D.   On: 10/06/2018 17:38   Ct Abdomen Pelvis W Contrast  Result Date: 10/05/2018 CLINICAL DATA:  Fever and leukocytosis. Abdominal pain. History of abscess. Abscess suspected. EXAM: CT ABDOMEN AND PELVIS WITH CONTRAST TECHNIQUE: Multidetector CT imaging of the abdomen and pelvis was performed using the standard protocol following bolus administration of intravenous contrast. CONTRAST:  147mL OMNIPAQUE IOHEXOL 300 MG/ML  SOLN COMPARISON:  Plain films 10/05/2018.  CT of 09/02/2018. FINDINGS: Lower chest: bibasilar airspace disease, somewhat more confluent than on the prior CT. There is also incompletely imaged probable consolidation within the lingula. Trace bilateral pleural effusions, decreased from 09/02/2018. Normal heart size. Hepatobiliary: Moderate hepatic steatosis and mild hepatomegaly at 18.9 cm craniocaudal. Perihepatic fluid collections including a subcapsular collection along the right hepatic lobe of 9.3 x 2.8 by 6.3 cm on image 16/3. Smaller collection along the lateral segment left liver lobe is likely extracapsular, including on image 19/3. Normal gallbladder, without biliary ductal dilatation. Pancreas: Normal, without mass or ductal dilatation. Spleen: Perisplenic loculated collection superiorly measures 10.7 x 9.4 by 13.9 cm and is increased from 9.9 x 7.9 cm on the prior exam. Adrenals/Urinary Tract: Normal adrenal  glands. Left extrarenal pelvis. Normal right kidney. Normal urinary bladder. Stomach/Bowel: The proximal stomach is contracted. Apparent wall thickening is likely secondary. Hartmann's pouch and descending colostomy. Normal terminal ileum. Normal small bowel caliber. Vascular/Lymphatic: Aortic and branch vessel atherosclerosis. Multiple small retroperitoneal nodes are not pathologic by size criteria and likely reactive. No pelvic sidewall adenopathy. Reproductive: Normal uterus and adnexa. Other: No significant free fluid. No free intraperitoneal air. Small volume relatively ill-defined small bowel mesenteric fluid at 3.2 cm on image 50/3. Midline open abdominal laparotomy site. Musculoskeletal: Degenerate disc disease at the lumbosacral junction IMPRESSION: 1. Multiple loculated fluid collections, suspicious for infected ascites or developing abscesses. 2. Status post left lower quadrant colostomy and Hartmann's pouch, without obstruction. 3. Persistent bibasilar pneumonia with probable confluent infection within the lingula, incompletely imaged. Decreased small bilateral pleural effusions. 4. Hepatic steatosis and hepatomegaly. These results will be called to the ordering clinician or representative by the Radiologist Assistant, and communication documented in the PACS or zVision Dashboard. Electronically Signed   By: Abigail Miyamoto M.D.   On: 10/05/2018 21:44    Labs:  CBC: Recent Labs    10/05/18 1140 10/06/18 0252 10/07/18 0311 10/08/18 0235  WBC 16.1* 13.0* 10.8* 12.1*  HGB 9.9* 8.2* 7.5* 8.3*  HCT 32.5* 26.8* 24.8* 28.2*  PLT 530* 434* 455* 500*    COAGS: Recent Labs    08/23/18 1834 10/06/18 1151  INR 1.15 1.33  APTT 31  --     BMP: Recent Labs    09/30/18 2130 10/01/18 0258 10/05/18 1140 10/06/18 0252  NA 133* 134* 135 136  K 4.0 3.6 4.3 3.9  CL 103 103 100 103  CO2 23 19* 24 22  GLUCOSE 103* 100* 149* 89  BUN 17 14 9 7   CALCIUM 8.3* 8.2* 8.5* 7.9*  CREATININE 1.09*  0.97 1.02* 0.88  GFRNONAA 58* >60 >60 >60  GFRAA >60 >60 >60 >60    LIVER FUNCTION TESTS: Recent Labs    08/24/18 0240 08/27/18 0415 08/31/18 0500  09/11/18 0616 09/12/18 0608 09/13/18 0653 10/01/18 0258  BILITOT 1.2 0.6 0.7  --   --   --   --  0.6  AST 123* 38 21  --   --   --   --  14*  ALT 81* 31 13  --   --   --   --  10  ALKPHOS 48 54 60  --   --   --   --  59  PROT 5.3* 5.7* 5.9*  --   --   --   --  6.6  ALBUMIN 1.7* 1.3* 1.1*   < > 1.5* 1.5* 1.5* 1.8*   < > = values in this interval not displayed.    Assessment and Plan: Intra-abdominal abscesses s/p drain placement x2 10/06/18 Flushed today by RN without issue. RUQ drain remains in place and continues to drain purulent-appearing material.  40 mL output. LUQ remains in place with ongoing significant output; 30 mL output yesterday.  Cultures with NGTD. Continues Zosyn. Continue with TID flushes.   Electronically Signed: Docia Barrier, PA 10/08/2018, 12:58 PM   I spent a total of 15 Minutes at the the patient's bedside AND on the patient's hospital floor or unit, greater than 50% of which was counseling/coordinating care for intra-abdominal abscesses

## 2018-10-08 NOTE — Progress Notes (Signed)
During med pass educated patient on indication for receiving heparin and lebatlol. HR at the time was 106. Patient refused meds.

## 2018-10-08 NOTE — Progress Notes (Signed)
   Subjective: No acute events overnight. Endorses mild pain around drain placement, controlled with home morphine and prn dilaudid. I shared with her that I talked to her primary surgeon, Dr. Rogelia Boga yesterday and updated him. Discussed with her the plan to continue IV antibiotics, f/u culture results, and monitor drain output. If output is low, drains may need to be repositioned.   Objective:  Vital signs in last 24 hours: Vitals:   10/07/18 0617 10/07/18 1612 10/07/18 2043 10/08/18 0459  BP: 109/79 112/81 112/88 120/81  Pulse: 83 (!) 102 (!) 106 (!) 116  Resp: 17  17 16   Temp: 97.6 F (36.4 C) 97.9 F (36.6 C) 100.1 F (37.8 C) 99 F (37.2 C)  TempSrc: Oral Oral Oral Oral  SpO2: 97% 100% 95% 92%   Gen: sitting up in chair, NAD Abd: LUQ JP drain with minimal bloody output, RUQ drain with moderate amount of serosangious output. TTP in bilateral lower quadrants. Dressing over midline wound remains clean and dry  Pulm: CTAB Cardiac: tachycardic, regular rhythm, no m/r/g  Assessment/Plan:  Active Problems:   Intractable nausea and vomiting   52yoF with recent hospital admission (12/19-1/24) for rectal perforation s/p colostomy complicated by bacteremia, ARDS, aspiration pneumonia, and acute blood loss anemia. Discharged to rehab facility and then admitted 2/5-2/6 for symptomatic anemia Hgb 6.0, given 2U pRBCs and discharged home with [facility] who now presents with one day of sob, cough productive of clear mucous, dry heaving, chest congestion, subjective fevers, sore throat, and diaphoresis. Initial eval pertinent for tachycardia HR 154, crackles at the left base, and leukocytosis 16 with left shift.   Intraabdominal Abscess: hepatic and splenic drains placed 2/11 with IR. Continues to have low grade temps, leukocytosis slightly elevated today. More likely inflammatory instead of infection. Over the last 24h, output from left drain 37ml, right drain 7ml. Nursing note states the patient  has been emptying the drains on her own without reporting to the nurse.  - appreciate general surgery's assistance: outpt f/u, no surgical indication at this time.  - appreciate IR's assistance: may need f/u abd CT to confirm RUQ drain placement if output is low - continue IV zosyn  - abdominal wound dressing changes bid  - pain control: home morphine 15mg  TID, added prn dilaudid after drains were placed    Depression: continue home venlafaxine  GERD: home pantoprazole HTN: home amlodipine   Dispo: Anticipated discharge pending fluid cultures and drain removal    Isabelle Course, MD 10/08/2018, 10:37 AM Pager: [phone]

## 2018-10-08 NOTE — Care Management Note (Signed)
Case Management Note  Patient Details  Name: Julie Williams MRN: [id number] Date of Birth: Mar 29, 1966  Subjective/Objective:                    Action/Plan: Patient  From home with home health [facility]. Will need new orders.   If patient discharges with drain flushes , she will need prescription for flushes and be taught drain care.  Expected Discharge Date:                  Expected Discharge Plan:  [place]  In-House Referral:     Discharge planning Services  CM Consult  Post Acute Care Choice:  Home Health Choice offered to:  Patient  DME Arranged:  N/A DME Agency:     HH Arranged:  PT, OT, RN [place] Agency:  [place]  Status of Service:  In process, will continue to follow  If discussed at Long Length of Stay Meetings, dates discussed:    Additional Comments:  Marilu Favre, RN 10/08/2018, 11:10 AM

## 2018-10-08 NOTE — Care Management Important Message (Signed)
Important Message  Patient Details  Name: Julie Williams MRN: [id number] Date of Birth: [birth year]   Medicare Important Message Given:  Yes    Addelynn Batte [address] 10/08/2018, 2:57 PM

## 2018-10-08 NOTE — Progress Notes (Signed)
[facility] Progress Note     Subjective: CC-  Up in chair this morning.Pain stable from yesteday. She is also sore at drain sites. Denies n/v. Colostomy functioning. Tolerating diet  Objective: Vital signs in last 24 hours: Temp:  [97.9 F (36.6 C)-100.1 F (37.8 C)] 99 F (37.2 C) (02/13 0459) Pulse Rate:  [102-116] 116 (02/13 0459) Resp:  [16-17] 16 (02/13 0459) BP: (112-120)/(81-88) 120/81 (02/13 0459) SpO2:  [92 %-100 %] 92 % (02/13 0459) Last BM Date: 10/07/18  Intake/Output from previous day: 02/12 0701 - 02/13 0700 In: 250 [P.O.:240] Out: 170 [Drains:70; Stool:100] Intake/Output this shift: No intake/output data recorded.  PE: Gen:  Alert, NAD Pulm:  effort normal Abd: Soft, mild diffuse tenderness, +BS, LLQ colostomy pink with some formed brown stool in pouch, midline wound open/dehisced with healthy pink granulation tissue/ visible suture/ no purulent drainage Ext:  No LE edema Skin: warm and dry  Lab Results:  Recent Labs    10/07/18 0311 10/08/18 0235  WBC 10.8* 12.1*  HGB 7.5* 8.3*  HCT 24.8* 28.2*  PLT 455* 500*   BMET Recent Labs    10/05/18 1140 10/06/18 0252  NA 135 136  K 4.3 3.9  CL 100 103  CO2 24 22  GLUCOSE 149* 89  BUN 9 7  CREATININE 1.02* 0.88  CALCIUM 8.5* 7.9*   PT/INR Recent Labs    10/06/18 1151  LABPROT 16.3*  INR 1.33   CMP     Component Value Date/Time   NA 136 10/06/2018 0252   K 3.9 10/06/2018 0252   CL 103 10/06/2018 0252   CO2 22 10/06/2018 0252   GLUCOSE 89 10/06/2018 0252   BUN 7 10/06/2018 0252   CREATININE 0.88 10/06/2018 0252   CREATININE 0.72 04/28/2014 1201   CALCIUM 7.9 (L) 10/06/2018 0252   PROT 6.6 10/01/2018 0258   ALBUMIN 1.8 (L) 10/01/2018 0258   AST 14 (L) 10/01/2018 0258   ALT 10 10/01/2018 0258   ALKPHOS 59 10/01/2018 0258   BILITOT 0.6 10/01/2018 0258   GFRNONAA >60 10/06/2018 0252   GFRNONAA >89 04/28/2014 1201   GFRAA >60 10/06/2018 0252   GFRAA >89 04/28/2014  1201   Lipase     Component Value Date/Time   LIPASE 87 (H) 08/23/2018 1834       Studies/Results: [place] Abscess Drain  Result Date: 10/06/2018 INDICATION: 53 year old with history of bowel perforation and status post diverting colostomy. Complex postoperative course including wound dehiscence and respiratory failure requiring mechanical ventilation. Recent CT demonstrated multiple intra-abdominal fluid collections and concern for intra-abdominal abscesses. Largest fluid collections are in the upper quadrants. EXAM: ULTRASOUND-GUIDED DRAIN PLACEMENT IN THE PERISPLENIC FLUID COLLECTION ULTRASOUND-GUIDED DRAIN PLACEMENT IN THE PERIHEPATIC FLUID COLLECTION MEDICATIONS: No additional antibiotics given for this procedure. ANESTHESIA/SEDATION: Fentanyl 100 mcg The patient was continuously monitored during the procedure by the interventional radiology nurse under my direct supervision. COMPLICATIONS: None immediate. PROCEDURE: Informed written consent was obtained from the patient after a thorough discussion of the procedural risks, benefits and alternatives. All questions were addressed. A timeout was performed prior to the initiation of the procedure. Left upper abdomen was evaluated with ultrasound. A large amount of perisplenic fluid was identified. The left lateral abdomen was prepped with chlorhexidine and sterile field was created. Skin and soft tissues were anesthetized with 1% lidocaine. Yueh catheter was directed to the perisplenic fluid collection with ultrasound guidance. Thick green/gray colored fluid was aspirated. Stiff Amplatz wire was advanced into the collection. The tract  was dilated to accommodate a 10.2 [place] multipurpose drain. 450 mL of the fluid was removed. Majority of the perisplenic fluid collection was decompressed. Catheter was sutured to skin and attached to a suction bulb. Specimen was sent for culture. Attention was directed to the right upper quadrant. Small irregular fluid  collections were identified around the liver and between the liver and right kidney. A new sterile tray was opened. The right lateral abdomen was prepped with chlorhexidine and sterile field was created. Skin and soft tissues were anesthetized with 1% lidocaine. Using ultrasound guidance, a new Yueh catheter was directed into the perihepatic fluid collection. Thick greenish fluid was removed and looked similar to the fluid from the left upper quadrant. Stiff Amplatz wire was advanced into the collection. Tract was dilated to accommodate a new 10.2 [place] multipurpose drain. Approximately 100 mL of the fluid was removed from the perihepatic space. The crescent-shaped perihepatic collection near the right hepatic dome appeared to be decreasing in size following placement of this drain. Catheter was sutured to skin and attached to a suction bulb. Fluid was sent for culture. FINDINGS: Mildly complex fluid in the perihepatic and perisplenic regions. Scattered echogenic foci within these fluid collections. Thick greenish gray fluid was removed from bilateral upper quadrants. 450 mL removed from the left upper quadrant and 130 mL removed from the right upper quadrant. The crescent-shaped fluid collection near the right hepatic dome appeared to be getting smaller in size suggesting there is communication with this collection and the right drain collection. IMPRESSION: Successful placement of bilateral abdominal drains with ultrasound guidance. One drain was placed in the large perisplenic fluid collection and 450 mL of fluid was removed. The other drain was placed in the perihepatic collection and 130 mL of fluid was removed. Fluid samples were sent for culture. Follow output and patient will need follow-up CT imaging to confirm adequate drainage of the upper quadrant fluid collections. Electronically Signed   By: Markus Daft M.D.   On: 10/06/2018 17:38   [place] Abscess Drain  Result Date: 10/06/2018 INDICATION: 53 year old  with history of bowel perforation and status post diverting colostomy. Complex postoperative course including wound dehiscence and respiratory failure requiring mechanical ventilation. Recent CT demonstrated multiple intra-abdominal fluid collections and concern for intra-abdominal abscesses. Largest fluid collections are in the upper quadrants. EXAM: ULTRASOUND-GUIDED DRAIN PLACEMENT IN THE PERISPLENIC FLUID COLLECTION ULTRASOUND-GUIDED DRAIN PLACEMENT IN THE PERIHEPATIC FLUID COLLECTION MEDICATIONS: No additional antibiotics given for this procedure. ANESTHESIA/SEDATION: Fentanyl 100 mcg The patient was continuously monitored during the procedure by the interventional radiology nurse under my direct supervision. COMPLICATIONS: None immediate. PROCEDURE: Informed written consent was obtained from the patient after a thorough discussion of the procedural risks, benefits and alternatives. All questions were addressed. A timeout was performed prior to the initiation of the procedure. Left upper abdomen was evaluated with ultrasound. A large amount of perisplenic fluid was identified. The left lateral abdomen was prepped with chlorhexidine and sterile field was created. Skin and soft tissues were anesthetized with 1% lidocaine. Yueh catheter was directed to the perisplenic fluid collection with ultrasound guidance. Thick green/gray colored fluid was aspirated. Stiff Amplatz wire was advanced into the collection. The tract was dilated to accommodate a 10.2 [place] multipurpose drain. 450 mL of the fluid was removed. Majority of the perisplenic fluid collection was decompressed. Catheter was sutured to skin and attached to a suction bulb. Specimen was sent for culture. Attention was directed to the right upper quadrant. Small irregular fluid collections were  identified around the liver and between the liver and right kidney. A new sterile tray was opened. The right lateral abdomen was prepped with chlorhexidine and  sterile field was created. Skin and soft tissues were anesthetized with 1% lidocaine. Using ultrasound guidance, a new Yueh catheter was directed into the perihepatic fluid collection. Thick greenish fluid was removed and looked similar to the fluid from the left upper quadrant. Stiff Amplatz wire was advanced into the collection. Tract was dilated to accommodate a new 10.2 [place] multipurpose drain. Approximately 100 mL of the fluid was removed from the perihepatic space. The crescent-shaped perihepatic collection near the right hepatic dome appeared to be decreasing in size following placement of this drain. Catheter was sutured to skin and attached to a suction bulb. Fluid was sent for culture. FINDINGS: Mildly complex fluid in the perihepatic and perisplenic regions. Scattered echogenic foci within these fluid collections. Thick greenish gray fluid was removed from bilateral upper quadrants. 450 mL removed from the left upper quadrant and 130 mL removed from the right upper quadrant. The crescent-shaped fluid collection near the right hepatic dome appeared to be getting smaller in size suggesting there is communication with this collection and the right drain collection. IMPRESSION: Successful placement of bilateral abdominal drains with ultrasound guidance. One drain was placed in the large perisplenic fluid collection and 450 mL of fluid was removed. The other drain was placed in the perihepatic collection and 130 mL of fluid was removed. Fluid samples were sent for culture. Follow output and patient will need follow-up CT imaging to confirm adequate drainage of the upper quadrant fluid collections. Electronically Signed   By: Markus Daft M.D.   On: 10/06/2018 17:38    Anti-infectives: Anti-infectives (From admission, onward)   Start     Dose/Rate Route Frequency Ordered Stop   10/07/18 0300  piperacillin-tazobactam (ZOSYN) IVPB 3.375 g     3.375 g 12.5 mL/hr over 240 Minutes Intravenous Every 8 hours  10/06/18 2017     10/05/18 2300  piperacillin-tazobactam (ZOSYN) IVPB 3.375 g  Status:  Discontinued     3.375 g 12.5 mL/hr over 240 Minutes Intravenous Every 8 hours 10/05/18 2256 10/06/18 2017       Assessment/Plan Depression GERD HTN ?Pneumonia  S/p ex lap with Hartman's for rectal perforation at [facility] with wound dehiscence -mepitel over wound then NS WD dressing changes BID -will need outpatient follow up with surgeon in [facility]  Intra-abdominal fluid collections  -s/p IR drain placement x2 2/11, flush TID per IR -cultures NGTD, continue to follow  FEN - regular diet VTE - sq Heparin ID - zosyn 2/10>>  Plan: Continue drains and IV antibiotics, follow cultures.    LOS: 3 days   Sharon Mt. Dema Severin, M.D. [facility]

## 2018-10-08 NOTE — Progress Notes (Signed)
Attempted to change dressing during pain medication administration and patient refused. Will offer to change again before end of shift

## 2018-10-09 ENCOUNTER — Inpatient Hospital Stay (HOSPITAL_COMMUNITY): Payer: PPO

## 2018-10-09 DIAGNOSIS — D649 Anemia, unspecified: Secondary | ICD-10-CM

## 2018-10-09 LAB — CBC
HCT: 26 % — ABNORMAL LOW (ref 36.0–46.0)
Hemoglobin: 7.8 g/dL — ABNORMAL LOW (ref 12.0–15.0)
MCH: 28.5 pg (ref 26.0–34.0)
MCHC: 30 g/dL (ref 30.0–36.0)
MCV: 94.9 fL (ref 80.0–100.0)
PLATELETS: 502 10*3/uL — AB (ref 150–400)
RBC: 2.74 MIL/uL — ABNORMAL LOW (ref 3.87–5.11)
RDW: 15 % (ref 11.5–15.5)
WBC: 10.3 10*3/uL (ref 4.0–10.5)
nRBC: 0 % (ref 0.0–0.2)

## 2018-10-09 MED ORDER — IOHEXOL 300 MG/ML  SOLN
100.0000 mL | Freq: Once | INTRAMUSCULAR | Status: AC | PRN
  Administered 2018-10-09: 100 mL via INTRAVENOUS

## 2018-10-09 MED ORDER — SODIUM CHLORIDE 0.9% FLUSH: 10.0000 mL | INTRAVENOUS | 1 refills | Status: DC | PRN

## 2018-10-09 MED ORDER — MORPHINE SULFATE 15 MG PO TABS
15.0000 mg | ORAL_TABLET | Freq: Every day | ORAL | Status: DC
  Administered 2018-10-09 – 2018-10-11 (×3): 15 mg via ORAL
  Filled 2018-10-09 (×3): qty 1

## 2018-10-09 MED ORDER — PANTOPRAZOLE SODIUM 40 MG PO TBEC
40.0000 mg | DELAYED_RELEASE_TABLET | Freq: Every day | ORAL | Status: DC
  Administered 2018-10-10 – 2018-10-13 (×4): 40 mg via ORAL
  Filled 2018-10-09 (×4): qty 1

## 2018-10-09 MED ORDER — DM-GUAIFENESIN ER 30-600 MG PO TB12
1.0000 | ORAL_TABLET | Freq: Two times a day (BID) | ORAL | Status: DC
  Administered 2018-10-09: 1 via ORAL
  Filled 2018-10-09: qty 1

## 2018-10-09 MED ORDER — RAMELTEON 8 MG PO TABS
8.0000 mg | ORAL_TABLET | Freq: Every evening | ORAL | Status: DC | PRN
  Filled 2018-10-09: qty 1

## 2018-10-09 MED ORDER — VENLAFAXINE HCL ER 37.5 MG PO CP24
37.5000 mg | ORAL_CAPSULE | Freq: Every day | ORAL | Status: DC
  Administered 2018-10-10 – 2018-10-13 (×4): 37.5 mg via ORAL
  Filled 2018-10-09 (×4): qty 1

## 2018-10-09 MED ORDER — DM-GUAIFENESIN ER 30-600 MG PO TB12
1.0000 | ORAL_TABLET | Freq: Two times a day (BID) | ORAL | Status: DC
  Administered 2018-10-09 – 2018-10-13 (×8): 1 via ORAL
  Filled 2018-10-09 (×8): qty 1

## 2018-10-09 MED ORDER — MORPHINE SULFATE 15 MG PO TABS
15.0000 mg | ORAL_TABLET | Freq: Four times a day (QID) | ORAL | Status: DC
  Administered 2018-10-09 – 2018-10-12 (×10): 15 mg via ORAL
  Administered 2018-10-12: 30 mg via ORAL
  Filled 2018-10-09 (×2): qty 1
  Filled 2018-10-09: qty 2
  Filled 2018-10-09 (×8): qty 1

## 2018-10-09 MED ORDER — QUETIAPINE FUMARATE 50 MG PO TABS: 25.0000 mg | ORAL_TABLET | Freq: Every evening | ORAL | Status: DC | PRN

## 2018-10-09 MED ORDER — QUETIAPINE FUMARATE 50 MG PO TABS
25.0000 mg | ORAL_TABLET | Freq: Every evening | ORAL | Status: DC | PRN
  Administered 2018-10-11 – 2018-10-12 (×2): 25 mg via ORAL
  Filled 2018-10-09 (×2): qty 1

## 2018-10-09 MED ORDER — MORPHINE SULFATE 15 MG PO TABS: 15.0000 mg | ORAL_TABLET | Freq: Four times a day (QID) | ORAL | Status: DC

## 2018-10-09 NOTE — Progress Notes (Signed)
   Subjective: No acute events overnight. Complains that her medicines have not been ordered correctly and her pain is not being managed. I will review her med list. Drains remain in place. She has questions regarding the timeline of things. Discussed plan for repeat CT today to monitor intraabdominal abscesses and see if her drains need to be repositioned.   Objective:  Vital signs in last 24 hours: Vitals:   10/08/18 0459 10/08/18 1508 10/08/18 2132 10/09/18 0600  BP: 120/81 97/75 123/90 121/90  Pulse: (!) 116 (!) 105 (!) 108 (!) 108  Resp: 16 16  18   Temp: 99 F (37.2 C) 97.9 F (36.6 C) 99.6 F (37.6 C) 98.7 F (37.1 C)  TempSrc: Oral Oral Oral Oral  SpO2: 92% 95% 96% 98%   Gen: sitting up in chair, NAD Abd: both JP drains have minimal output (patient states they were recently emptied) TTP in bilateral lower quadrants. Dressing over midline wound remains clean and dry    Assessment/Plan:  Active Problems:   Intractable nausea and vomiting   52yoF with recent hospital admission (12/19-1/24) for rectal perforation s/p colostomy complicated by bacteremia, ARDS, aspiration pneumonia, and acute blood loss anemia. Discharged to rehab facility and then admitted 2/5-2/6 for symptomatic anemia Hgb 6.0, given 2U pRBCs and discharged home with [facility] who now presents with one day of sob, cough productive of clear mucous, dry heaving, chest congestion, subjective fevers, sore throat, and diaphoresis. Initial eval pertinent for tachycardia HR 154, crackles at the left base, and leukocytosis 16 with left shift.   Intraabdominal Abscess: hepatic and splenic drains placed 2/11 with IR. Temps are improving and leukocytosis has resolved. Over the last 24h, output from left drain 11ml, right drain 32ml. - appreciate general surgery's assistance: outpt f/u, no surgical indication at this time.  - appreciate IR's assistance with JP drains - drain output decreasing, will get repeat CT a/p to assess size  of abscesses and possible need for drain repositioning  - continue IV zosyn  - abdominal wound dressing changes bid  - pain control: home morphine 15mg  TID, added prn dilaudid after drains were placed    Depression: continue home venlafaxine  GERD: home pantoprazole HTN: home amlodipine   Dispo: Anticipated discharge pending fluid cultures and drain removal    Isabelle Course, MD 10/09/2018, 12:19 PM Pager: [phone]

## 2018-10-09 NOTE — Care Management Note (Signed)
Case Management Note  Patient Details  Name: Julie Williams MRN: [id number] Date of Birth: 1966/03/18  Subjective/Objective:                    Action/Plan: Entered orders for home health RN,PT,OT  Patient will need prescriptions for drain flushes at discharge. Dr Donne Hazel aware.   Home health arranged [facility] per patient request   Expected Discharge Date:                  Expected Discharge Plan:  [place]  In-House Referral:     Discharge planning Services  CM Consult  Post Acute Care Choice:  Home Health Choice offered to:  Patient  DME Arranged:  N/A DME Agency:     HH Arranged:  PT, OT, RN Walker Agency:  [place]  Status of Service:  In process, will continue to follow  If discussed at Long Length of Stay Meetings, dates discussed:    Additional Comments:  Marilu Favre, RN 10/09/2018, 2:21 PM

## 2018-10-09 NOTE — Progress Notes (Signed)
Patient ID: Julie Williams, female   DOB: [birth year], 53 y.o.   MRN: [id number]   Acute Care Surgery Service Progress Note:    Chief Complaint/Subjective: Feels overall better but still feels a little ill Drinking protein shake  Objective: Vital signs in last 24 hours: Temp:  [97.9 F (36.6 C)-99.6 F (37.6 C)] 98.5 F (36.9 C) (02/14 1339) Pulse Rate:  [93-108] 93 (02/14 1339) Resp:  [16-18] 18 (02/14 1339) BP: (97-123)/(67-90) 99/67 (02/14 1339) SpO2:  [95 %-98 %] 96 % (02/14 1339) Last BM Date: 10/09/18  Intake/Output from previous day: 02/13 0701 - 02/14 0700 In: 1185 [P.O.:1140] Out: 32.5 [Drains:32.5] Intake/Output this shift: Total I/O In: 550 [P.O.:550] Out: 30 [Drains:30]  Alert, sitting in BS chair abd - soft, ostomy LLQ - stool/air in bag Rt drain - purulent, 32cc/past day Left drain - empty, ?serosang  Lab Results: CBC  Recent Labs    10/08/18 0235 10/09/18 0207  WBC 12.1* 10.3  HGB 8.3* 7.8*  HCT 28.2* 26.0*  PLT 500* 502*   BMET No results for input(s): NA, K, CL, CO2, GLUCOSE, BUN, CREATININE, CALCIUM in the last 72 hours. LFT Hepatic Function Latest Ref Rng & Units 10/01/2018 09/13/2018 09/12/2018  Total Protein 6.5 - 8.1 g/dL 6.6 - -  Albumin 3.5 - 5.0 g/dL 1.8(L) 1.5(L) 1.5(L)  AST 15 - 41 U/L 14(L) - -  ALT 0 - 44 U/L 10 - -  Alk Phosphatase 38 - 126 U/L 59 - -  Total Bilirubin 0.3 - 1.2 mg/dL 0.6 - -  Bilirubin, Direct 0.0 - 0.2 mg/dL 0.2 - -   PT/INR No results for input(s): LABPROT, INR in the last 72 hours. ABG No results for input(s): PHART, HCO3 in the last 72 hours.  Invalid input(s): PCO2, PO2  Studies/Results:  Anti-infectives: Anti-infectives (From admission, onward)   Start     Dose/Rate Route Frequency Ordered Stop   10/07/18 0300  piperacillin-tazobactam (ZOSYN) IVPB 3.375 g     3.375 g 12.5 mL/hr over 240 Minutes Intravenous Every 8 hours 10/06/18 2017     10/05/18 2300  piperacillin-tazobactam (ZOSYN) IVPB 3.375 g   Status:  Discontinued     3.375 g 12.5 mL/hr over 240 Minutes Intravenous Every 8 hours 10/05/18 2256 10/06/18 2017      Medications: Scheduled Meds: . dextromethorphan-guaiFENesin  1 tablet Oral BID  . docusate sodium  100 mg Oral BID  . heparin  5,000 Units Subcutaneous Q8H  . morphine  15 mg Oral QHS  . morphine  15-30 mg Oral QID  . [START ON 10/10/2018] pantoprazole  40 mg Oral Daily  . polyethylene glycol  17 g Oral Daily  . sodium chloride flush  3 mL Intravenous Q12H  . sodium chloride flush  5 mL Intracatheter Q8H  . [START ON 10/10/2018] venlafaxine XR  37.5 mg Oral Q breakfast   Continuous Infusions: . sodium chloride Stopped (10/07/18 0348)  . piperacillin-tazobactam (ZOSYN)  IV 3.375 g (10/09/18 1049)   PRN Meds:.sodium chloride, acetaminophen **OR** acetaminophen, calcium carbonate, HYDROmorphone (DILAUDID) injection, ondansetron **OR** ondansetron (ZOFRAN) IV, QUEtiapine, senna-docusate, sodium chloride flush  Assessment/Plan: Patient Active Problem List   Diagnosis Date Noted  . Dehydration 10/05/2018  . Nausea and vomiting 10/05/2018  . Upper respiratory tract infection 10/05/2018  . Intractable nausea and vomiting 10/05/2018  . Anemia due to chronic illness 10/01/2018  . Bacteremia due to Enterococcus 09/08/2018  . Colostomy present ([place]) 09/08/2018  . Dysphagia   . Benign essential HTN   .  Tachycardia   . Hypernatremia   . Leukocytosis   . Acute blood loss anemia   . Hepatic abscess   . AKI (acute kidney injury) ([place])   . History of ETT   . Stercoral ulcer of rectum 08/27/2018  . Rectal perforation s/p partial colectomy an ddiverting colostomy 08/27/2018  . Acute respiratory failure with hypoxemia ([place])   . Elevated blood pressure reading 08/16/2018  . Wheezing 04/08/2018  . Chest pain 12/14/2017  . Osteoarthritis of right acromioclavicular joint 11/10/2017  . Agitation 09/07/2017  . Vaginal atrophy 07/05/2016  . Throat discomfort 05/30/2016  .  Preop cardiovascular exam 05/20/2016  . Acromioclavicular joint arthritis 12/25/2015  . Essential hypertension 07/10/2015  . Cervical spine degeneration 06/19/2015  . S/P complete repair of rotator cuff 04/13/2015  . Complete tear of right rotator cuff 01/06/2015  . S/P cervical spinal fusion 01/06/2015  . GERD (gastroesophageal reflux disease) 11/07/2014  . Hoarseness 04/08/2013  . Hearing loss 03/04/2013  . Tonsil pain 03/04/2013  . History of colonic polyps 09/13/2011  . Hypertension with goal to be determined 09/13/2011  . Tobacco use disorder 07/10/2011  . Long term (current) use of opiate analgesic 06/04/2011  . Chronic neck pain 06/04/2011  . Pain in right shoulder 06/04/2011  . Depression 07/17/2009  . Rotator cuff syndrome of right shoulder 06/29/2009   Depression GERD HTN ?Pneumonia  S/p ex lap with Hartman's for rectal perforation at [facility] with wound dehiscence -mepitel over wound then NS WD dressing changes BID -will need outpatient follow up with surgeon in [facility]  Intra-abdominal fluid collections  -s/p IR drain placement x2 2/11, flush TID per IR -cultures NGTD, continue to follow  FEN -regular diet VTE -sq Heparin ID -zosyn 2/10>>; wbc normalizing  Plan: Continue drains and IV antibiotics, follow cultures. generally repeat CT when drain output <15cc/day  Will not plan on seeing over the weekend.  pls call if have questions Pt will need to f/u with her outside surgeon in [place]   LOS: 4 days    Leighton Ruff. Redmond Pulling, MD, FACS General, Bariatric, & Minimally Invasive Surgery [phone] [facility]

## 2018-10-10 DIAGNOSIS — K651 Peritoneal abscess: Secondary | ICD-10-CM

## 2018-10-10 DIAGNOSIS — B9689 Other specified bacterial agents as the cause of diseases classified elsewhere: Secondary | ICD-10-CM

## 2018-10-10 HISTORY — DX: Peritoneal abscess: K65.1

## 2018-10-10 LAB — AEROBIC/ANAEROBIC CULTURE (SURGICAL/DEEP WOUND)

## 2018-10-10 LAB — BASIC METABOLIC PANEL
Anion gap: 9 (ref 5–15)
BUN: <5 mg/dL — ABNORMAL LOW (ref 6–20)
CO2: 23 mmol/L (ref 22–32)
Calcium: 8.3 mg/dL — ABNORMAL LOW (ref 8.9–10.3)
Chloride: 105 mmol/L (ref 98–111)
Creatinine, Ser: 0.92 mg/dL (ref 0.44–1.00)
GFR calc Af Amer: >60 mL/min (ref >60)
GFR calc non Af Amer: >60 mL/min (ref >60)
Glucose, Bld: 118 mg/dL — ABNORMAL HIGH (ref 70–99)
Potassium: 4 mmol/L (ref 3.5–5.1)
Sodium: 137 mmol/L (ref 135–145)

## 2018-10-10 LAB — AEROBIC/ANAEROBIC CULTURE W GRAM STAIN (SURGICAL/DEEP WOUND)

## 2018-10-10 LAB — CBC
HCT: 27.7 % — ABNORMAL LOW (ref 36.0–46.0)
Hemoglobin: 8.3 g/dL — ABNORMAL LOW (ref 12.0–15.0)
MCH: 28.5 pg (ref 26.0–34.0)
MCHC: 30 g/dL (ref 30.0–36.0)
MCV: 95.2 fL (ref 80.0–100.0)
Platelets: 594 10*3/uL — ABNORMAL HIGH (ref 150–400)
RBC: 2.91 MIL/uL — ABNORMAL LOW (ref 3.87–5.11)
RDW: 15 % (ref 11.5–15.5)
WBC: 10.2 10*3/uL (ref 4.0–10.5)
nRBC: 0 % (ref 0.0–0.2)

## 2018-10-10 LAB — TSH: TSH: 6.123 u[IU]/mL — ABNORMAL HIGH (ref 0.350–4.500)

## 2018-10-10 MED ORDER — SODIUM CHLORIDE 0.9 % IV SOLN
3.0000 g | Freq: Four times a day (QID) | INTRAVENOUS | Status: DC
  Administered 2018-10-10 – 2018-10-13 (×11): 3 g via INTRAVENOUS
  Filled 2018-10-10 (×13): qty 3

## 2018-10-10 NOTE — Progress Notes (Signed)
   Subjective: Feeling well this morning. Issues with her medications have been resolved. Empathized with her slow progress and complicated medical problems from her rectal perforation. She expressed lots of concern over not understanding what happened to her during her January hospitalization. Tried to explain as much as we could. Discussed results of repeat CT showing majority resolution of abscesses but some fluid remains around the liver. Plan see if IR wants to reposition her drains. Will deescalate antibiotic today   Objective:  Vital signs in last 24 hours: Vitals:   10/09/18 1339 10/09/18 2217 10/10/18 0239 10/10/18 0605  BP: 99/67 106/81  108/80  Pulse: 93 (!) 105  93  Resp: 18 14  16   Temp: 98.5 F (36.9 C) 98.3 F (36.8 C)  98.6 F (37 C)  TempSrc: Oral Oral  Oral  SpO2: 96% 96%  97%  Weight:   69.9 kg   Height:   5\' 5"  (1.651 m)    Gen: sitting up in chair, NAD Abd: both JP drains have minimal output (recently emptied)TTP in RLQ and epigastric region, +BS Dressing over midline wound remains clean and dry. Liquid output in colostomy bag   Assessment/Plan:  Active Problems:   Intractable nausea and vomiting   52yoF with recent hospital admission (12/19-1/24) for rectal perforation s/p colostomy complicated by bacteremia, ARDS, aspiration pneumonia, and acute blood loss anemia. Discharged to rehab facility and then admitted 2/5-2/6 for symptomatic anemia Hgb 6.0, given 2U pRBCs and discharged home with [facility] who now presents with one day of sob, cough productive of clear mucous, dry heaving, chest congestion, subjective fevers, sore throat, and diaphoresis. Initial eval pertinent for tachycardia HR 154, crackles at the left base, and leukocytosis 16 with left shift.   Intraabdominal Abscess: hepatic and splenic drains placed 2/11 with IR. Fever curve improving. Remains afebrile without leukocytosis. Over the last 24h, output from left drains = 40cc cumulative - appreciate  general surgery's assistance: outpt f/u, no surgical indication at this time.  - appreciate IR's assistance with JP drains, will see if they need to be repositioned  - discontinue IV zosyn, start IV unasyn. If she does well on IV unasyn, we can switch her to po  - abdominal wound dressing changes bid  - pain control: home morphine 15mg  TID, added prn dilaudid after drains were placed   Sinus Tachycardia: initially 150s on presentation, improved to 90s. Suspect tachycardia was related to abdominal infection.TSH elevated 6.1. Would recommending repeating TSH after this acute illness.   Depression: continue home venlafaxine  GERD: home pantoprazole  HTN: she does not have HTN, she was started on amlodipine and labetalol during Jan hospitalization during which Bps were elevated. She had issues with hypotension at rehab while taking these medications. We have discontinued them this admission and she remains normotensive.   Dispo: Anticipated discharge pending fluid cultures, resolution of intraabdominal abscesses, and initiation of po antibiotics    Isabelle Course, MD 10/10/2018, 11:55 AM Pager: [phone]

## 2018-10-10 NOTE — Progress Notes (Signed)
Referring Physician(s): Dr. Redmond Pulling  Supervising Physician: Pascal Lux  Patient Status:  [facility] - In-pt  Chief Complaint: Intraabdominal fluid collection s/p drain placement x2 10/06/18  Subjective: Resting in bed. Had repeat CT last pm. RN reports just flushed both drains easily.  Allergies: Bee venom  Medications:  Current Facility-Administered Medications:  .  0.9 %  sodium chloride infusion, 250 mL, Intravenous, PRN, Neva Seat, MD, Stopped at 10/07/18 [id number] .  acetaminophen (TYLENOL) tablet 650 mg, 650 mg, Oral, Q6H PRN, 650 mg at 10/09/18 2328 **OR** acetaminophen (TYLENOL) suppository 650 mg, 650 mg, Rectal, Q6H PRN, Neva Seat, MD .  Ampicillin-Sulbactam (UNASYN) 3 g in sodium chloride 0.9 % 100 mL IVPB, 3 g, Intravenous, Q6H, Isabelle Course, MD .  calcium carbonate (TUMS - dosed in mg elemental calcium) chewable tablet 400 mg of elemental calcium, 400 mg of elemental calcium, Oral, BID PRN, Neva Seat, MD, 400 mg of elemental calcium at 10/09/18 0611 .  dextromethorphan-guaiFENesin (MUCINEX DM) 30-600 MG per 12 hr tablet 1 tablet, 1 tablet, Oral, BID, Isabelle Course, MD, 1 tablet at 10/10/18 0606 .  docusate sodium (COLACE) capsule 100 mg, 100 mg, Oral, BID, Meuth, Brooke A, PA-C, 100 mg at 10/10/18 0956 .  heparin injection 5,000 Units, 5,000 Units, Subcutaneous, Q8H, Harriet Pho, Shannon A, PA-C .  HYDROmorphone (DILAUDID) injection 1 mg, 1 mg, Intravenous, Q3H PRN, Isabelle Course, MD, 1 mg at 10/10/18 1305 .  morphine (MSIR) tablet 15 mg, 15 mg, Oral, QHS, Isabelle Course, MD, 15 mg at 10/09/18 2156 .  morphine (MSIR) tablet 15-30 mg, 15-30 mg, Oral, QID, Isabelle Course, MD, 15 mg at 10/10/18 1421 .  ondansetron (ZOFRAN) tablet 4 mg, 4 mg, Oral, Q6H PRN **OR** ondansetron (ZOFRAN) injection 4 mg, 4 mg, Intravenous, Q6H PRN, Neva Seat, MD, 4 mg at 10/07/18 1528 .  pantoprazole (PROTONIX) EC tablet 40 mg, 40 mg, Oral, Daily, Isabelle Course, MD, 40 mg at  10/10/18 0606 .  polyethylene glycol (MIRALAX / GLYCOLAX) packet 17 g, 17 g, Oral, Daily, Meuth, Brooke A, PA-C, 17 g at 10/10/18 0956 .  QUEtiapine (SEROQUEL) tablet 25 mg, 25 mg, Oral, QHS PRN, Isabelle Course, MD .  senna-docusate (Senokot-S) tablet 1 tablet, 1 tablet, Oral, QHS PRN, Neva Seat, MD .  sodium chloride flush (NS) 0.9 % injection 3 mL, 3 mL, Intravenous, Q12H, Neva Seat, MD, 3 mL at 10/10/18 1002 .  sodium chloride flush (NS) 0.9 % injection 3 mL, 3 mL, Intravenous, PRN, Neva Seat, MD .  sodium chloride flush (NS) 0.9 % injection 5 mL, 5 mL, Intracatheter, Q8H, Anselm Pancoast, Adam, MD, 5 mL at 10/10/18 1423 .  venlafaxine XR (EFFEXOR-XR) 24 hr capsule 37.5 mg, 37.5 mg, Oral, Q breakfast, Isabelle Course, MD, 37.5 mg at 10/10/18 0606    Vital Signs: BP 108/80 (BP Location: Right Arm)   Pulse 93   Temp 98.6 F (37 C) (Oral)   Resp 16   Ht 5\' 5"  (1.651 m)   Wt 69.9 kg   SpO2 97%   BMI 25.63 kg/m   Physical Exam  NAD, resting in bed Abdomen: soft.  Colostomy in place.  RUQ drain in place.  Insertion site c/d/i.  Purulent output LUQ drain in place.  Insertion site intact. Thinner hazy serosanguinous output  Imaging: [place] Abscess Drain  Result Date: 10/06/2018 INDICATION: 53 year old with history of bowel perforation and status post diverting colostomy. Complex postoperative course including wound dehiscence and respiratory failure requiring  mechanical ventilation. Recent CT demonstrated multiple intra-abdominal fluid collections and concern for intra-abdominal abscesses. Largest fluid collections are in the upper quadrants. EXAM: ULTRASOUND-GUIDED DRAIN PLACEMENT IN THE PERISPLENIC FLUID COLLECTION ULTRASOUND-GUIDED DRAIN PLACEMENT IN THE PERIHEPATIC FLUID COLLECTION MEDICATIONS: No additional antibiotics given for this procedure. ANESTHESIA/SEDATION: Fentanyl 100 mcg The patient was continuously monitored during the procedure by the interventional radiology  nurse under my direct supervision. COMPLICATIONS: None immediate. PROCEDURE: Informed written consent was obtained from the patient after a thorough discussion of the procedural risks, benefits and alternatives. All questions were addressed. A timeout was performed prior to the initiation of the procedure. Left upper abdomen was evaluated with ultrasound. A large amount of perisplenic fluid was identified. The left lateral abdomen was prepped with chlorhexidine and sterile field was created. Skin and soft tissues were anesthetized with 1% lidocaine. Yueh catheter was directed to the perisplenic fluid collection with ultrasound guidance. Thick green/gray colored fluid was aspirated. Stiff Amplatz wire was advanced into the collection. The tract was dilated to accommodate a 10.2 [place] multipurpose drain. 450 mL of the fluid was removed. Majority of the perisplenic fluid collection was decompressed. Catheter was sutured to skin and attached to a suction bulb. Specimen was sent for culture. Attention was directed to the right upper quadrant. Small irregular fluid collections were identified around the liver and between the liver and right kidney. A new sterile tray was opened. The right lateral abdomen was prepped with chlorhexidine and sterile field was created. Skin and soft tissues were anesthetized with 1% lidocaine. Using ultrasound guidance, a new Yueh catheter was directed into the perihepatic fluid collection. Thick greenish fluid was removed and looked similar to the fluid from the left upper quadrant. Stiff Amplatz wire was advanced into the collection. Tract was dilated to accommodate a new 10.2 [place] multipurpose drain. Approximately 100 mL of the fluid was removed from the perihepatic space. The crescent-shaped perihepatic collection near the right hepatic dome appeared to be decreasing in size following placement of this drain. Catheter was sutured to skin and attached to a suction bulb. Fluid was sent  for culture. FINDINGS: Mildly complex fluid in the perihepatic and perisplenic regions. Scattered echogenic foci within these fluid collections. Thick greenish gray fluid was removed from bilateral upper quadrants. 450 mL removed from the left upper quadrant and 130 mL removed from the right upper quadrant. The crescent-shaped fluid collection near the right hepatic dome appeared to be getting smaller in size suggesting there is communication with this collection and the right drain collection. IMPRESSION: Successful placement of bilateral abdominal drains with ultrasound guidance. One drain was placed in the large perisplenic fluid collection and 450 mL of fluid was removed. The other drain was placed in the perihepatic collection and 130 mL of fluid was removed. Fluid samples were sent for culture. Follow output and patient will need follow-up CT imaging to confirm adequate drainage of the upper quadrant fluid collections. Electronically Signed   By: Markus Daft M.D.   On: 10/06/2018 17:38   [place] Abscess Drain  Result Date: 10/06/2018 INDICATION: 53 year old with history of bowel perforation and status post diverting colostomy. Complex postoperative course including wound dehiscence and respiratory failure requiring mechanical ventilation. Recent CT demonstrated multiple intra-abdominal fluid collections and concern for intra-abdominal abscesses. Largest fluid collections are in the upper quadrants. EXAM: ULTRASOUND-GUIDED DRAIN PLACEMENT IN THE PERISPLENIC FLUID COLLECTION ULTRASOUND-GUIDED DRAIN PLACEMENT IN THE PERIHEPATIC FLUID COLLECTION MEDICATIONS: No additional antibiotics given for this procedure. ANESTHESIA/SEDATION: Fentanyl 100 mcg The patient  was continuously monitored during the procedure by the interventional radiology nurse under my direct supervision. COMPLICATIONS: None immediate. PROCEDURE: Informed written consent was obtained from the patient after a thorough discussion of the procedural  risks, benefits and alternatives. All questions were addressed. A timeout was performed prior to the initiation of the procedure. Left upper abdomen was evaluated with ultrasound. A large amount of perisplenic fluid was identified. The left lateral abdomen was prepped with chlorhexidine and sterile field was created. Skin and soft tissues were anesthetized with 1% lidocaine. Yueh catheter was directed to the perisplenic fluid collection with ultrasound guidance. Thick green/gray colored fluid was aspirated. Stiff Amplatz wire was advanced into the collection. The tract was dilated to accommodate a 10.2 [place] multipurpose drain. 450 mL of the fluid was removed. Majority of the perisplenic fluid collection was decompressed. Catheter was sutured to skin and attached to a suction bulb. Specimen was sent for culture. Attention was directed to the right upper quadrant. Small irregular fluid collections were identified around the liver and between the liver and right kidney. A new sterile tray was opened. The right lateral abdomen was prepped with chlorhexidine and sterile field was created. Skin and soft tissues were anesthetized with 1% lidocaine. Using ultrasound guidance, a new Yueh catheter was directed into the perihepatic fluid collection. Thick greenish fluid was removed and looked similar to the fluid from the left upper quadrant. Stiff Amplatz wire was advanced into the collection. Tract was dilated to accommodate a new 10.2 [place] multipurpose drain. Approximately 100 mL of the fluid was removed from the perihepatic space. The crescent-shaped perihepatic collection near the right hepatic dome appeared to be decreasing in size following placement of this drain. Catheter was sutured to skin and attached to a suction bulb. Fluid was sent for culture. FINDINGS: Mildly complex fluid in the perihepatic and perisplenic regions. Scattered echogenic foci within these fluid collections. Thick greenish gray fluid was  removed from bilateral upper quadrants. 450 mL removed from the left upper quadrant and 130 mL removed from the right upper quadrant. The crescent-shaped fluid collection near the right hepatic dome appeared to be getting smaller in size suggesting there is communication with this collection and the right drain collection. IMPRESSION: Successful placement of bilateral abdominal drains with ultrasound guidance. One drain was placed in the large perisplenic fluid collection and 450 mL of fluid was removed. The other drain was placed in the perihepatic collection and 130 mL of fluid was removed. Fluid samples were sent for culture. Follow output and patient will need follow-up CT imaging to confirm adequate drainage of the upper quadrant fluid collections. Electronically Signed   By: Markus Daft M.D.   On: 10/06/2018 17:38   Ct Abdomen Pelvis W Contrast  Result Date: 10/09/2018 CLINICAL DATA:  Splenic abscess with percutaneous drain placement 10/06/2018. EXAM: CT ABDOMEN AND PELVIS WITH CONTRAST TECHNIQUE: Multidetector CT imaging of the abdomen and pelvis was performed using the standard protocol following bolus administration of intravenous contrast. CONTRAST:  133mL OMNIPAQUE IOHEXOL 300 MG/ML  SOLN COMPARISON:  Abdominopelvic CT 10/05/2018. FINDINGS: Lower chest: Left pleural effusion has slightly increased in volume, with a component extending into the inferior aspect of the fissure appearing about the same (image 7/3). Bibasilar airspace opacities appear about the same, likely atelectasis. Hepatobiliary: Stable heterogeneous hepatic steatosis. No focal hepatic lesion. Percutaneous drain has been placed into the subhepatic space with near complete drainage of the previously demonstrated fluid collection. There is a thin residual component laterally measuring 8  mm in maximal thickness. No new or enlarging perihepatic fluid collections. The gallbladder is incompletely distended. No evidence of gallstones. Stable  mild extrahepatic biliary dilatation. Pancreas: Unremarkable. No pancreatic ductal dilatation or surrounding inflammatory changes. Spleen: Normal in size without focal abnormality. Subphrenic drain has been placed into the perisplenic fluid collection demonstrated previously. The tip of this drain does not appear to violate the left hemidiaphragm based on the reformatted images. Most of the fluid has been drained with residual fluid having a maximal thickness of 12 mm (image 50/6). Adrenals/Urinary Tract: Both adrenal glands appear normal. The kidneys appear normal without evidence of urinary tract calculus, suspicious lesion or hydronephrosis. No bladder abnormalities are seen. Stomach/Bowel: No evidence of bowel wall thickening, distention or surrounding inflammatory change. Descending colostomy and Hartman pouch appear stable. Vascular/Lymphatic: There are no enlarged abdominal or pelvic lymph nodes. No acute vascular findings. Stable mild atherosclerosis. Reproductive: The uterus and ovaries appear normal. No adnexal mass. Other: Open midline abdominal incision appears stable. There are several small residual intra-abdominal fluid collections, including a 2.6 cm collection anterior to the lower pole of the left kidney (image 46/3), a 3.4 cm collection in the ileocolonic mesentery (image 51/3) and a right pelvic 3.0 cm collection on image 69/3. No new or enlarging collections are identified. Musculoskeletal: No acute or significant osseous findings. IMPRESSION: 1. Successful, near complete drainage of the perihepatic and perisplenic fluid collections following drain placement. 2. Other small intra-abdominal fluid collections are stable. No new or enlarging collections identified. 3. Left pleural effusion has mildly increased in volume. The component in the inferior aspect of the left major fissure and the associated bibasilar airspace opacities have not significantly changed. 4. Grossly stable appearance of open  ventral incision. Electronically Signed   By: Richardean Sale M.D.   On: 10/09/2018 19:53    Labs:  CBC: Recent Labs    10/07/18 0311 10/08/18 0235 10/09/18 0207 10/10/18 0744  WBC 10.8* 12.1* 10.3 10.2  HGB 7.5* 8.3* 7.8* 8.3*  HCT 24.8* 28.2* 26.0* 27.7*  PLT 455* 500* 502* 594*    COAGS: Recent Labs    08/23/18 1834 10/06/18 1151  INR 1.15 1.33  APTT 31  --     BMP: Recent Labs    10/01/18 0258 10/05/18 1140 10/06/18 0252 10/10/18 0744  NA 134* 135 136 137  K 3.6 4.3 3.9 4.0  CL 103 100 103 105  CO2 19* 24 22 23   GLUCOSE 100* 149* 89 118*  BUN 14 9 7  <5*  CALCIUM 8.2* 8.5* 7.9* 8.3*  CREATININE 0.97 1.02* 0.88 0.92  GFRNONAA >60 >60 >60 >60  GFRAA >60 >60 >60 >60    LIVER FUNCTION TESTS: Recent Labs    08/24/18 0240 08/27/18 0415 08/31/18 0500  09/11/18 0616 09/12/18 0608 09/13/18 0653 10/01/18 0258  BILITOT 1.2 0.6 0.7  --   --   --   --  0.6  AST 123* 38 21  --   --   --   --  14*  ALT 81* 31 13  --   --   --   --  10  ALKPHOS 48 54 60  --   --   --   --  59  PROT 5.3* 5.7* 5.9*  --   --   --   --  6.6  ALBUMIN 1.7* 1.3* 1.1*   < > 1.5* 1.5* 1.5* 1.8*   < > = values in this interval not displayed.    Assessment and  Plan: Intra-abdominal abscesses s/p drain placement x2 10/06/18 Flushed today by RN without issue. RUQ drain remains in place and continues to drain purulent-appearing material.  LUQ remains in place with continued output, though more serosanguinous  CT reviewed with Dr. Pascal Lux. Overall improvement and near resolution of these 2 abscesses. No need to reposition drains. Given continued output from both, would be reluctant to remove this early. Discussed that she may need to be discharged with drains. IR cont to follow.  Electronically Signed: Ascencion Dike, PA-C 10/10/2018, 3:03 PM   I spent a total of 15 Minutes at the the patient's bedside AND on the patient's hospital floor or unit, greater than 50% of which was  counseling/coordinating care for intra-abdominal abscesses

## 2018-10-11 DIAGNOSIS — Z862 Personal history of diseases of the blood and blood-forming organs and certain disorders involving the immune mechanism: Secondary | ICD-10-CM

## 2018-10-11 NOTE — Progress Notes (Signed)
   Subjective: Feeling well this morning on deescalated antibiotics. Drains are to remain in place, explained that this would be the case until drainage rate slows significantly. She would like to see her imaging results tomorrow. Informed her that one of her cultures is now growing fusobacterium and that we are awaiting further details. Explained the she would need to see the surgeon who did her initially surgery for follow up and eventual reanastomosis and that the timeline for when this would occur depends on if she can remain infection free. She would like to speak with [facility] tomorrow about her ostomy care. All questions answered.  Objective:  Vital signs in last 24 hours: Vitals:   10/10/18 0605 10/10/18 1538 10/10/18 2210 10/11/18 0459  BP: 108/80 108/77 118/81 117/79  Pulse: 93 (!) 110 (!) 114 90  Resp: 16 16 17 18   Temp: 98.6 F (37 C) 99.5 F (37.5 C) (!) 100.7 F (38.2 C) 98.5 F (36.9 C)  TempSrc: Oral Oral Oral Oral  SpO2: 97% 95% 94% 97%  Weight:      Height:       Gen: sitting up in chair, NAD CV: RRR no m/r/g Abd: JP drains with scant output, Abdomen TTP, +BS Dressing over midline wound remains clean and dry. Minimal liquid output in colostomy bag given recent change  Assessment/Plan:  Principal Problem:   Intra-abdominal abscess (HCC) Active Problems:   Rectal perforation s/p partial colectomy an ddiverting colostomy   Intractable nausea and vomiting   52yoF with recent hospital admission (12/19-1/24) for rectal perforation s/p colostomy complicated by bacteremia, ARDS, aspiration pneumonia, and acute blood loss anemia. Discharged to rehab facility and then admitted 2/5-2/6 for symptomatic anemia Hgb 6.0, given 2U pRBCs and discharged home with [facility] who now presents with one day of sob, cough productive of clear mucous, dry heaving, chest congestion, subjective fevers, sore throat, and diaphoresis. Initial eval pertinent for tachycardia HR 154, crackles at the left  base, and leukocytosis 16 with left shift.   Intraabdominal Abscess: Hepatic and splenic drains placed 2/11 with IR. Fever curve improving. Remains afebrile without leukocytosis. Drains to remain in place until output <10cc/day. - Appreciate general surgery's assistance: outpt f/u, no surgical indication at this time.  - Appreciate IR's assistance with JP drains  - Coontinue Unasyn. Will likely transition to PO soon  - Abdominal wound dressing changes BID  - Pain control: home morphine 15mg  TID, added prn dilaudid after drains were placed   Sinus Tachycardia: Resolved. Initially 150s on presentation, improved to 90s. Suspect tachycardia was related to abdominal infection. TSH elevated 6.1. Recommend repeating TSH after this acute illness.   Depression: continue home venlafaxine  GERD: home pantoprazole  HTN: she does not have HTN, she was started on amlodipine and labetalol during Jan hospitalization during which Bps were elevated. She had issues with hypotension at rehab while taking these medications. We have discontinued them this admission and she remains normotensive.   Dispo: Anticipated discharge pending fluid cultures, resolution of intraabdominal abscesses, and initiation of po antibiotics    Neva Seat, MD 10/11/2018, 7:00 AM

## 2018-10-12 DIAGNOSIS — K750 Abscess of liver: Secondary | ICD-10-CM

## 2018-10-12 DIAGNOSIS — G8929 Other chronic pain: Secondary | ICD-10-CM

## 2018-10-12 DIAGNOSIS — K651 Peritoneal abscess: Secondary | ICD-10-CM

## 2018-10-12 DIAGNOSIS — Z9049 Acquired absence of other specified parts of digestive tract: Secondary | ICD-10-CM

## 2018-10-12 DIAGNOSIS — K219 Gastro-esophageal reflux disease without esophagitis: Secondary | ICD-10-CM

## 2018-10-12 DIAGNOSIS — D733 Abscess of spleen: Principal | ICD-10-CM

## 2018-10-12 DIAGNOSIS — R200 Anesthesia of skin: Secondary | ICD-10-CM

## 2018-10-12 DIAGNOSIS — K5900 Constipation, unspecified: Secondary | ICD-10-CM

## 2018-10-12 DIAGNOSIS — Z978 Presence of other specified devices: Secondary | ICD-10-CM

## 2018-10-12 DIAGNOSIS — F329 Major depressive disorder, single episode, unspecified: Secondary | ICD-10-CM

## 2018-10-12 DIAGNOSIS — I10 Essential (primary) hypertension: Secondary | ICD-10-CM

## 2018-10-12 DIAGNOSIS — Z8619 Personal history of other infectious and parasitic diseases: Secondary | ICD-10-CM

## 2018-10-12 DIAGNOSIS — Z87891 Personal history of nicotine dependence: Secondary | ICD-10-CM

## 2018-10-12 DIAGNOSIS — Z933 Colostomy status: Secondary | ICD-10-CM

## 2018-10-12 DIAGNOSIS — Z8719 Personal history of other diseases of the digestive system: Secondary | ICD-10-CM

## 2018-10-12 DIAGNOSIS — H9193 Unspecified hearing loss, bilateral: Secondary | ICD-10-CM

## 2018-10-12 DIAGNOSIS — Z91030 Bee allergy status: Secondary | ICD-10-CM

## 2018-10-12 DIAGNOSIS — Z79891 Long term (current) use of opiate analgesic: Secondary | ICD-10-CM

## 2018-10-12 LAB — CBC
HCT: 24.5 % — ABNORMAL LOW (ref 36.0–46.0)
Hemoglobin: 7.2 g/dL — ABNORMAL LOW (ref 12.0–15.0)
MCH: 28 pg (ref 26.0–34.0)
MCHC: 29.4 g/dL — ABNORMAL LOW (ref 30.0–36.0)
MCV: 95.3 fL (ref 80.0–100.0)
Platelets: 619 10*3/uL — ABNORMAL HIGH (ref 150–400)
RBC: 2.57 MIL/uL — ABNORMAL LOW (ref 3.87–5.11)
RDW: 15.1 % (ref 11.5–15.5)
WBC: 10.1 10*3/uL (ref 4.0–10.5)
nRBC: 0 % (ref 0.0–0.2)

## 2018-10-12 LAB — AEROBIC/ANAEROBIC CULTURE W GRAM STAIN (SURGICAL/DEEP WOUND): Culture: NO GROWTH

## 2018-10-12 LAB — AEROBIC/ANAEROBIC CULTURE (SURGICAL/DEEP WOUND)

## 2018-10-12 MED ORDER — HYDROMORPHONE HCL 1 MG/ML IJ SOLN
1.0000 mg | INTRAMUSCULAR | Status: DC | PRN
  Administered 2018-10-12 – 2018-10-13 (×3): 1 mg via INTRAVENOUS
  Filled 2018-10-12 (×3): qty 1

## 2018-10-12 MED ORDER — HYDROMORPHONE HCL 1 MG/ML IJ SOLN: 1.0000 mg | INTRAMUSCULAR | Status: DC | PRN

## 2018-10-12 MED ORDER — HYDROMORPHONE HCL 1 MG/ML IJ SOLN: 0.5000 mg | INTRAMUSCULAR | Status: DC | PRN

## 2018-10-12 MED ORDER — MORPHINE SULFATE 15 MG PO TABS
15.0000 mg | ORAL_TABLET | ORAL | Status: DC
  Administered 2018-10-12 – 2018-10-13 (×5): 30 mg via ORAL
  Filled 2018-10-12 (×5): qty 2

## 2018-10-12 MED ORDER — HYDROMORPHONE HCL 1 MG/ML IJ SOLN
0.5000 mg | INTRAMUSCULAR | Status: DC | PRN
  Administered 2018-10-12 (×2): 0.5 mg via INTRAVENOUS
  Filled 2018-10-12 (×2): qty 1

## 2018-10-12 NOTE — Progress Notes (Signed)
   Subjective: Continues to feel well. Very independent and mobile. Tolerating po. Asking about left hand numbness that has been constant since her abdominal surgery. Abdominal pain is well controlled. Discussed weaning dilaudid and getting her on a good pain regiment for discharge. Personally reviewed her CT scan with her. All questions were answered. Discussed plan to consult ID for help with antibiotic duration. Will also continue to keep her originally [employer], sport and exercise from [place] updated.   Objective:  Vital signs in last 24 hours: Vitals:   10/11/18 0459 10/11/18 1300 10/11/18 2233 10/12/18 0604  BP: 117/79 (!) 123/92 119/89 116/87  Pulse: 90 99 (!) 105 99  Resp: 18 17 17 15   Temp: 98.5 F (36.9 C) 98.8 F (37.1 C) 98.9 F (37.2 C) 99.1 F (37.3 C)  TempSrc: Oral Oral Oral Oral  SpO2: 97% 98% 94% 96%  Weight:      Height:       Gen: sitting up in chair, NAD Abd: right JP with more drainage than left. Abdomen TTP diffusely. Dressing over midline wound remains clean and dry. Liquid output in colostomy bag  Micro: Peri hepatic abscess: NG at 4d Peri splenic abscess:  Fusobacterium varium, beta lactamase neg  Antibiotics:  Zosyn 2/10-2/15 Unasyn 2/15 - present   Assessment/Plan:  Principal Problem:   Intra-abdominal abscess ([place]) Active Problems:   Rectal perforation s/p partial colectomy an ddiverting colostomy   Intractable nausea and vomiting   52yoF with recent hospital admission (12/19-1/24) for rectal perforation s/p colostomy complicated by bacteremia, ARDS, aspiration pneumonia, and acute blood loss anemia. Discharged to rehab facility and then admitted 2/5-2/6 for symptomatic anemia Hgb 6.0, given 2U pRBCs and discharged home with [facility] who now presents with one day of sob, cough productive of clear mucous, dry heaving, chest congestion, subjective fevers, sore throat, and diaphoresis. Initial eval pertinent for tachycardia HR 154, crackles at the left base, and  leukocytosis 16 with left shift.   Intraabdominal Abscess: hepatic and splenic drains placed 2/11 with IR. Fever curve improving. Remains afebrile without leukocytosis. Over the last 24h, output from right drain = 40cc, left drain = 10cc - appreciate general surgery's assistance: outpt f/u, no surgical indication at this time.  - appreciate IR's assistance with JP drains, will keep in place until drainage is <10cc - consult ID for antibiotic recommendation - s/p zosyn 2/10-2/15 - Continue unasyn 2/15 - present  - abdominal wound dressing changes bid  - pain control: home morphine 15-30mg  QID while awake, wean dilaudid to 0.5mg  q3h prn for breakthrough pain   Depression: continue home venlafaxine  GERD: home pantoprazole  Dispo: Anticipated discharge pending fluid cultures, resolution of intraabdominal abscesses, and initiation of po antibiotics    Isabelle Course, MD 10/12/2018, 10:59 AM Pager: [phone]

## 2018-10-12 NOTE — Progress Notes (Signed)
Referring Physician(s): Dr. Redmond Pulling  Supervising Physician: Dr. Anselm Pancoast  Patient Status:  [facility] - In-pt  Chief Complaint: Intraabdominal fluid collection s/p drain placement x2 10/06/18  Subjective: "They're trying to get me out of here."  Allergies: Bee venom  Medications:  Current Facility-Administered Medications:  .  0.9 %  sodium chloride infusion, 250 mL, Intravenous, PRN, Neva Seat, MD, Last Rate: 10 mL/hr at 10/11/18 2330 .  acetaminophen (TYLENOL) tablet 650 mg, 650 mg, Oral, Q6H PRN, 650 mg at 10/12/18 0756 **OR** acetaminophen (TYLENOL) suppository 650 mg, 650 mg, Rectal, Q6H PRN, Neva Seat, MD .  Ampicillin-Sulbactam (UNASYN) 3 g in sodium chloride 0.9 % 100 mL IVPB, 3 g, Intravenous, Q6H, Isabelle Course, MD, Last Rate: 200 mL/hr at 10/12/18 0606, 3 g at 10/12/18 0606 .  calcium carbonate (TUMS - dosed in mg elemental calcium) chewable tablet 400 mg of elemental calcium, 400 mg of elemental calcium, Oral, BID PRN, Neva Seat, MD, 400 mg of elemental calcium at 10/09/18 0611 .  dextromethorphan-guaiFENesin (MUCINEX DM) 30-600 MG per 12 hr tablet 1 tablet, 1 tablet, Oral, BID, Isabelle Course, MD, 1 tablet at 10/12/18 [phone] .  docusate sodium (COLACE) capsule 100 mg, 100 mg, Oral, BID, Meuth, Brooke A, PA-C, 100 mg at 10/12/18 1000 .  heparin injection 5,000 Units, 5,000 Units, Subcutaneous, Q8H, Joaquim Nam, PA-C, Stopped at 10/11/18 [phone] .  HYDROmorphone (DILAUDID) injection 0.5 mg, 0.5 mg, Intravenous, Q3H PRN, Isabelle Course, MD, 0.5 mg at 10/12/18 1251 .  morphine (MSIR) tablet 15-30 mg, 15-30 mg, Oral, Q4H while awake, Isabelle Course, MD .  ondansetron [facility]) tablet 4 mg, 4 mg, Oral, Q6H PRN **OR** ondansetron (ZOFRAN) injection 4 mg, 4 mg, Intravenous, Q6H PRN, Neva Seat, MD, 4 mg at 10/07/18 1528 .  pantoprazole (PROTONIX) EC tablet 40 mg, 40 mg, Oral, Daily, Isabelle Course, MD, 40 mg at 10/12/18 0603 .  polyethylene glycol  (MIRALAX / GLYCOLAX) packet 17 g, 17 g, Oral, Daily, Meuth, Brooke A, PA-C, 17 g at 10/12/18 1000 .  QUEtiapine (SEROQUEL) tablet 25 mg, 25 mg, Oral, QHS PRN, Isabelle Course, MD, 25 mg at 10/11/18 2105 .  senna-docusate (Senokot-S) tablet 1 tablet, 1 tablet, Oral, QHS PRN, Neva Seat, MD .  sodium chloride flush (NS) 0.9 % injection 3 mL, 3 mL, Intravenous, Q12H, Neva Seat, MD, 3 mL at 10/11/18 1000 .  sodium chloride flush (NS) 0.9 % injection 3 mL, 3 mL, Intravenous, PRN, Neva Seat, MD .  sodium chloride flush (NS) 0.9 % injection 5 mL, 5 mL, Intracatheter, Q8H, Anselm Pancoast, Adam, MD, 5 mL at 10/11/18 1400 .  venlafaxine XR (EFFEXOR-XR) 24 hr capsule 37.5 mg, 37.5 mg, Oral, Q breakfast, Isabelle Course, MD, 37.5 mg at 10/12/18 0603    Vital Signs: BP 116/87 (BP Location: Right Arm)   Pulse 99   Temp 99.1 F (37.3 C) (Oral)   Resp 15   Ht 5\' 5"  (1.651 m)   Wt 154 lb (69.9 kg)   SpO2 96%   BMI 25.63 kg/m   Physical Exam  NAD, resting in bed Abdomen: soft.  Colostomy in place.  RUQ drain in place.  Insertion site c/d/i.  Purulent output LUQ drain in place.  Insertion site intact. Thinner hazy serosanguinous output  Imaging: Ct Abdomen Pelvis W Contrast  Result Date: 10/09/2018 CLINICAL DATA:  Splenic abscess with percutaneous drain placement 10/06/2018. EXAM: CT ABDOMEN AND PELVIS WITH CONTRAST TECHNIQUE: Multidetector CT imaging of the  abdomen and pelvis was performed using the standard protocol following bolus administration of intravenous contrast. CONTRAST:  135mL OMNIPAQUE IOHEXOL 300 MG/ML  SOLN COMPARISON:  Abdominopelvic CT 10/05/2018. FINDINGS: Lower chest: Left pleural effusion has slightly increased in volume, with a component extending into the inferior aspect of the fissure appearing about the same (image 7/3). Bibasilar airspace opacities appear about the same, likely atelectasis. Hepatobiliary: Stable heterogeneous hepatic steatosis. No focal hepatic  lesion. Percutaneous drain has been placed into the subhepatic space with near complete drainage of the previously demonstrated fluid collection. There is a thin residual component laterally measuring 8 mm in maximal thickness. No new or enlarging perihepatic fluid collections. The gallbladder is incompletely distended. No evidence of gallstones. Stable mild extrahepatic biliary dilatation. Pancreas: Unremarkable. No pancreatic ductal dilatation or surrounding inflammatory changes. Spleen: Normal in size without focal abnormality. Subphrenic drain has been placed into the perisplenic fluid collection demonstrated previously. The tip of this drain does not appear to violate the left hemidiaphragm based on the reformatted images. Most of the fluid has been drained with residual fluid having a maximal thickness of 12 mm (image 50/6). Adrenals/Urinary Tract: Both adrenal glands appear normal. The kidneys appear normal without evidence of urinary tract calculus, suspicious lesion or hydronephrosis. No bladder abnormalities are seen. Stomach/Bowel: No evidence of bowel wall thickening, distention or surrounding inflammatory change. Descending colostomy and Hartman pouch appear stable. Vascular/Lymphatic: There are no enlarged abdominal or pelvic lymph nodes. No acute vascular findings. Stable mild atherosclerosis. Reproductive: The uterus and ovaries appear normal. No adnexal mass. Other: Open midline abdominal incision appears stable. There are several small residual intra-abdominal fluid collections, including a 2.6 cm collection anterior to the lower pole of the left kidney (image 46/3), a 3.4 cm collection in the ileocolonic mesentery (image 51/3) and a right pelvic 3.0 cm collection on image 69/3. No new or enlarging collections are identified. Musculoskeletal: No acute or significant osseous findings. IMPRESSION: 1. Successful, near complete drainage of the perihepatic and perisplenic fluid collections following  drain placement. 2. Other small intra-abdominal fluid collections are stable. No new or enlarging collections identified. 3. Left pleural effusion has mildly increased in volume. The component in the inferior aspect of the left major fissure and the associated bibasilar airspace opacities have not significantly changed. 4. Grossly stable appearance of open ventral incision. Electronically Signed   By: Richardean Sale M.D.   On: 10/09/2018 19:53    Labs:  CBC: Recent Labs    10/08/18 0235 10/09/18 0207 10/10/18 0744 10/12/18 0447  WBC 12.1* 10.3 10.2 10.1  HGB 8.3* 7.8* 8.3* 7.2*  HCT 28.2* 26.0* 27.7* 24.5*  PLT 500* 502* 594* 619*    COAGS: Recent Labs    08/23/18 1834 10/06/18 1151  INR 1.15 1.33  APTT 31  --     BMP: Recent Labs    10/01/18 0258 10/05/18 1140 10/06/18 0252 10/10/18 0744  NA 134* 135 136 137  K 3.6 4.3 3.9 4.0  CL 103 100 103 105  CO2 19* 24 22 23   GLUCOSE 100* 149* 89 118*  BUN 14 9 7  <5*  CALCIUM 8.2* 8.5* 7.9* 8.3*  CREATININE 0.97 1.02* 0.88 0.92  GFRNONAA >60 >60 >60 >60  GFRAA >60 >60 >60 >60    LIVER FUNCTION TESTS: Recent Labs    08/24/18 0240 08/27/18 0415 08/31/18 0500  09/11/18 0616 09/12/18 0608 09/13/18 0653 10/01/18 0258  BILITOT 1.2 0.6 0.7  --   --   --   --  0.6  AST 123* 38 21  --   --   --   --  14*  ALT 81* 31 13  --   --   --   --  10  ALKPHOS 48 54 60  --   --   --   --  59  PROT 5.3* 5.7* 5.9*  --   --   --   --  6.6  ALBUMIN 1.7* 1.3* 1.1*   < > 1.5* 1.5* 1.5* 1.8*   < > = values in this interval not displayed.    Assessment and Plan: Intra-abdominal abscesses s/p drain placement x2 10/06/18 No change to drain output.  Plan remains to continue drains at this time.  Patient is nearing discharge. Will place outpatient follow-up orders. Patient will hear from schedulers with date and time of appointment.    Electronically Signed: Docia Barrier, PA 10/12/2018, 12:56 PM   I spent a total of 15  Minutes at the the patient's bedside AND on the patient's hospital floor or unit, greater than 50% of which was counseling/coordinating care for intra-abdominal abscesses

## 2018-10-12 NOTE — Care Management Important Message (Signed)
Important Message  Patient Details  Name: Julie Williams MRN: [id number] Date of Birth: 02-09-1966   Medicare Important Message Given:  Yes    Sunni Richardson Montine Circle 10/12/2018, 3:59 PM

## 2018-10-12 NOTE — Consult Note (Signed)
[facility] Nurse ostomy follow up Patient receiving care in [facility] 6N12. No family present. Patient performed existing pouch removal, peristomal cleansing, sizing of stoma, cutting new skin barrier, application of skin barrier and pouch all while standing in front of the mirror in the bathroom.  The only portion I performed was assisting with barrier ring application around the stoma. Stoma type/location: LUQ colostomy Stomal assessment/size: 1 1/4 inches Peristomal assessment: intact Treatment options for stomal/peristomal skin: barrier ring Output:  Formed brown stool Ostomy pouching: 2pc. Flat 2 3/4 inches Education provided: As above. Enrolled patient in [place] Discharge program: Yes, during prior admission.  Patient states she received the box of supplies the day she was readmitted and did not have a chance to go through the boxed supplies.  Five additional skin barriers, pouches, and barrier rings ordered via [facility] to [employer].  Patient instructed to take the supplies home with her when she is discharged. Val Riles, RN, MSN, CWOCN, CNS-BC, pager [phone]

## 2018-10-12 NOTE — Consult Note (Addendum)
   [facility] CM Inpatient Consult   10/12/2018  Julie Williams June 03, 1966 [id number]   Patient chart has been reviewed for readmissions less than 30 days and for high risk score, 27%, for unplanned readmissions.  Patient had been referred to [facility] for post hospital follow up after previous hospital admission.  Per chart review, community RN attempted to engage patient by telephone on date of readmission to hospital.  Spoke with patient at bedside to explain [place] and to assess community needs. Patient stated that she does not want anyone to help her unless they will be helping with the ostomy wound. Explained that our services do not interfere with any services arranged by hospital inpatient staff or interfere with community services arranged for her when she gets home.   Patient states that she has no need for transportation, meal, or medication services. States that she does not want to pay for care management services. Explained to [name] that services with [facility] are totally voluntary and she does not incur a cost as its' part of her insurance plan with [facility] and primary provider. Explained to patient that she may discontinue services at any time.  Provided patient with information folder. Patient would like to continue with community follow up when she transitions home. States for RN to leave a message and she will return calls. Consent form signed.   Netta Cedars, MSN, [facility] Liaison Nurse Mobile Phone [phone]  Toll free office [phone]
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
## 2018-10-13 LAB — HEMOGLOBIN AND HEMATOCRIT, BLOOD
HCT: 29.1 % — ABNORMAL LOW (ref 36.0–46.0)
Hemoglobin: 8.5 g/dL — ABNORMAL LOW (ref 12.0–15.0)

## 2018-10-13 MED ORDER — AMOXICILLIN-POT CLAVULANATE 875-125 MG PO TABS: 1.0000 | ORAL_TABLET | Freq: Two times a day (BID) | ORAL | 0 refills | Status: DC

## 2018-10-13 NOTE — Progress Notes (Signed)
Referring Physician(s): Dr. Redmond Pulling  Supervising Physician: Daryll Brod  Patient Status:  [facility] - In-pt  Chief Complaint:  Intraabdominal fluid collection s/p drain placement x2 10/06/18   Subjective:  Patient requested IR PA to do drain teaching. She is frustrated because she feels she can't do it by herself.  Allergies: Bee venom  Medications: Prior to Admission medications   Medication Sig Start Date End Date Taking? Authorizing Provider  guaiFENesin (ROBITUSSIN) 100 MG/5ML SOLN Take 30 mLs (600 mg total) by mouth every 6 (six) hours. 09/11/18  Yes [name], MD  morphine (MSIR) 30 MG tablet Take 30 mg by mouth every 4 (four) hours as needed for severe pain.   Yes [provider]  omeprazole (PRILOSEC) 40 MG capsule Take 1 capsule (40 mg total) by mouth daily. 08/14/18  Yes Hoffman, Jessica Ratliff, DO  QUEtiapine (SEROQUEL) 25 MG tablet Take 1 tablet (25 mg total) by mouth at bedtime. 09/11/18  Yes [name], MD  acetaminophen (TYLENOL) 160 MG/5ML solution Place 20.3 mLs (650 mg total) into feeding tube every 6 (six) hours as needed for fever. Patient not taking: Reported on 10/06/2018 09/11/18   [name], MD  albuterol (PROVENTIL HFA;VENTOLIN HFA) 108 [phone] Base) MCG/ACT inhaler Inhale 1-2 puffs into the lungs every 6 (six) hours as needed for wheezing or shortness of breath. Patient not taking: Reported on 10/06/2018 04/07/18   Kalman Shan Ratliff, DO  amLODipine (NORVASC) 5 MG tablet Take 1 tablet (5 mg total) by mouth daily. Patient not taking: Reported on 10/06/2018 09/12/18   [name], MD  calcium carbonate (TUMS - DOSED IN MG ELEMENTAL CALCIUM) 500 MG chewable tablet Chew 2 tablets (400 mg of elemental calcium total) by mouth 2 (two) times daily as needed for indigestion or heartburn. Patient not taking: Reported on 10/06/2018 09/11/18   [name], MD  Chlorhexidine Gluconate Cloth 2 % PADS Apply 6 each topically daily. Patient not  taking: Reported on 10/06/2018 09/12/18   [name], MD  heparin 5000 UNIT/ML injection Inject 1 mL (5,000 Units total) into the skin every 8 (eight) hours. Patient not taking: Reported on 10/06/2018 09/11/18   [name], MD  labetalol (NORMODYNE) 100 MG tablet Take 1 tablet (100 mg total) by mouth 2 (two) times daily. Patient not taking: Reported on 10/06/2018 09/11/18   [name], MD  Maltodextrin-Xanthan Gum ([place]) POWD Take 1 Container by mouth as needed. Patient not taking: Reported on 10/06/2018 09/11/18   [name], MD  morphine (MSIR) 15 MG tablet Take 1 tablet (15 mg total) by mouth 3 (three) times daily for 7 days. Patient not taking: Reported on 10/06/2018 09/11/18 10/05/18  [name], MD  mouth rinse LIQD solution 15 mLs by Mouth Rinse route 3 (three) times daily. Patient not taking: Reported on 10/06/2018 09/11/18   [name], MD  ondansetron [facility]) 4 MG/2ML SOLN injection Inject 2 mLs (4 mg total) into the vein every 6 (six) hours as needed for nausea or vomiting. Patient not taking: Reported on 10/06/2018 09/11/18   [name], MD  polyethylene glycol powder (GLYCOLAX/MIRALAX) powder DISSOLVE 255 GRAMS INTO LIQUID AND TAKE BY MOUTH ONCE. Patient not taking: Reported on 10/06/2018 09/18/17   Kalman Shan Ratliff, DO  sodium chloride flush (NS) 0.9 % SOLN Inject 10 mLs into the vein as needed. 10/09/18   Saverio Danker, PA-C  venlafaxine XR (EFFEXOR-XR) 37.5 MG 24 hr capsule  Take 1 capsule (37.5 mg total) by mouth daily with breakfast. Patient not taking: Reported on 10/06/2018 08/14/18   Valinda Party, DO     Vital Signs: BP (!) 121/94 (BP Location: Right Arm)   Pulse 87   Temp 98.3 F (36.8 C) (Oral)   Resp 16   Ht 5\' 5"  (1.651 m)   Wt 69.9 kg   SpO2 93%   BMI 25.63 kg/m   Physical Exam Awake and alert NAD Sitting on side of bed Both drains in place. Instructed patient how to flush drains - she tells me my  method was "much easier".  Imaging: Ct Abdomen Pelvis W Contrast  Result Date: 10/09/2018 CLINICAL DATA:  Splenic abscess with percutaneous drain placement 10/06/2018. EXAM: CT ABDOMEN AND PELVIS WITH CONTRAST TECHNIQUE: Multidetector CT imaging of the abdomen and pelvis was performed using the standard protocol following bolus administration of intravenous contrast. CONTRAST:  1110mL OMNIPAQUE IOHEXOL 300 MG/ML  SOLN COMPARISON:  Abdominopelvic CT 10/05/2018. FINDINGS: Lower chest: Left pleural effusion has slightly increased in volume, with a component extending into the inferior aspect of the fissure appearing about the same (image 7/3). Bibasilar airspace opacities appear about the same, likely atelectasis. Hepatobiliary: Stable heterogeneous hepatic steatosis. No focal hepatic lesion. Percutaneous drain has been placed into the subhepatic space with near complete drainage of the previously demonstrated fluid collection. There is a thin residual component laterally measuring 8 mm in maximal thickness. No new or enlarging perihepatic fluid collections. The gallbladder is incompletely distended. No evidence of gallstones. Stable mild extrahepatic biliary dilatation. Pancreas: Unremarkable. No pancreatic ductal dilatation or surrounding inflammatory changes. Spleen: Normal in size without focal abnormality. Subphrenic drain has been placed into the perisplenic fluid collection demonstrated previously. The tip of this drain does not appear to violate the left hemidiaphragm based on the reformatted images. Most of the fluid has been drained with residual fluid having a maximal thickness of 12 mm (image 50/6). Adrenals/Urinary Tract: Both adrenal glands appear normal. The kidneys appear normal without evidence of urinary tract calculus, suspicious lesion or hydronephrosis. No bladder abnormalities are seen. Stomach/Bowel: No evidence of bowel wall thickening, distention or surrounding inflammatory change.  Descending colostomy and Hartman pouch appear stable. Vascular/Lymphatic: There are no enlarged abdominal or pelvic lymph nodes. No acute vascular findings. Stable mild atherosclerosis. Reproductive: The uterus and ovaries appear normal. No adnexal mass. Other: Open midline abdominal incision appears stable. There are several small residual intra-abdominal fluid collections, including a 2.6 cm collection anterior to the lower pole of the left kidney (image 46/3), a 3.4 cm collection in the ileocolonic mesentery (image 51/3) and a right pelvic 3.0 cm collection on image 69/3. No new or enlarging collections are identified. Musculoskeletal: No acute or significant osseous findings. IMPRESSION: 1. Successful, near complete drainage of the perihepatic and perisplenic fluid collections following drain placement. 2. Other small intra-abdominal fluid collections are stable. No new or enlarging collections identified. 3. Left pleural effusion has mildly increased in volume. The component in the inferior aspect of the left major fissure and the associated bibasilar airspace opacities have not significantly changed. 4. Grossly stable appearance of open ventral incision. Electronically Signed   By: Richardean Sale M.D.   On: 10/09/2018 19:53    Labs:  CBC: Recent Labs    10/08/18 0235 10/09/18 0207 10/10/18 0744 10/12/18 0447 10/13/18 0741  WBC 12.1* 10.3 10.2 10.1  --   HGB 8.3* 7.8* 8.3* 7.2* 8.5*  HCT 28.2* 26.0* 27.7* 24.5* 29.1*  PLT 500* 502* 594* 619*  --     COAGS: Recent Labs    08/23/18 1834 10/06/18 1151  INR 1.15 1.33  APTT 31  --     BMP: Recent Labs    10/01/18 0258 10/05/18 1140 10/06/18 0252 10/10/18 0744  NA 134* 135 136 137  K 3.6 4.3 3.9 4.0  CL 103 100 103 105  CO2 19* 24 22 23   GLUCOSE 100* 149* 89 118*  BUN 14 9 7  <5*  CALCIUM 8.2* 8.5* 7.9* 8.3*  CREATININE 0.97 1.02* 0.88 0.92  GFRNONAA >60 >60 >60 >60  GFRAA >60 >60 >60 >60    LIVER FUNCTION TESTS: Recent  Labs    08/24/18 0240 08/27/18 0415 08/31/18 0500  09/11/18 0616 09/12/18 0608 09/13/18 0653 10/01/18 0258  BILITOT 1.2 0.6 0.7  --   --   --   --  0.6  AST 123* 38 21  --   --   --   --  14*  ALT 81* 31 13  --   --   --   --  10  ALKPHOS 48 54 60  --   --   --   --  59  PROT 5.3* 5.7* 5.9*  --   --   --   --  6.6  ALBUMIN 1.7* 1.3* 1.1*   < > 1.5* 1.5* 1.5* 1.8*   < > = values in this interval not displayed.    Assessment and Plan:  Intra-abdominal abscesses s/p drain placement x2 10/06/18  Drain teaching done - patient seems much more comfortable now.  Rx for NS flush given to patient and drain cards given. Instructed her to record output daily.  Our office will call her with drain clinic appointment.  Electronically Signed: Murrell Redden, PA-C 10/13/2018, 10:36 AM    I spent a total of 15 Minutes at the the patient's bedside AND on the patient's hospital floor or unit, greater than 50% of which was counseling/coordinating care for f/u drains.

## 2018-10-13 NOTE — Progress Notes (Signed)
   Subjective: Slept well last night. Would like to go home this morning. She is having trouble flushing her own drains. IR has seen her and resolved that issue. She will d/c home today. Discussed that she needs to call her original surgeon to schedule an appt asap. F/u with PCP, ID, and IR have been arranged.   Objective:  Vital signs in last 24 hours: Vitals:   10/12/18 0604 10/12/18 1321 10/12/18 2120 10/13/18 0558  BP: 116/87 120/89 (!) 124/98 (!) 121/94  Pulse: 99 (!) 102 (!) 110 87  Resp: 15 18 17 16   Temp: 99.1 F (37.3 C) 98.5 F (36.9 C) 99.5 F (37.5 C) 98.3 F (36.8 C)  TempSrc: Oral Oral Oral Oral  SpO2: 96% 100% 96% 93%  Weight:      Height:       Gen: walking around the room, NAD Abd: both JP drains with minimal output. Abdomen TTP diffusely. Dressing over midline wound remains clean and dry. Liquid output in colostomy bag  Micro: Peri hepatic abscess: NG final  Peri splenic abscess:  Fusobacterium varium, beta lactamase neg  Antibiotics:  Zosyn 2/10-2/15 Unasyn 2/15 - present  Discharged with Augmentin for at least 28 days  Assessment/Plan:  Principal Problem:   Intra-abdominal abscess ([place]) Active Problems:   Rectal perforation s/p partial colectomy an ddiverting colostomy   Intractable nausea and vomiting   Constipation   52yoF with recent hospital admission (12/19-1/24) for rectal perforation s/p colostomy complicated by bacteremia, ARDS, aspiration pneumonia, and acute blood loss anemia. Discharged to rehab facility and then admitted 2/5-2/6 for symptomatic anemia Hgb 6.0, given 2U pRBCs and discharged home with [facility] who now presents with one day of sob, cough productive of clear mucous, dry heaving, chest congestion, subjective fevers, sore throat, and diaphoresis. Initial eval pertinent for tachycardia HR 154, crackles at the left base, and leukocytosis 16 with left shift.   Intraabdominal Abscess: hepatic and splenic drains placed 2/11 with IR. Fever  curve improving. Remains afebrile without leukocytosis. Over the last 24h, output from right drain = 25cc, left drain = 20cc - appreciate general surgery's assistance: outpt f/u, no surgical indication at this time.  - appreciate IR's assistance with JP drains, will keep in place until drainage is <10cc. They will contact pt to arrange f/u - ID recs: augmentin for at least 28 days after discharge, f/u appt has been scheduled, repeat imaging at that time - s/p zosyn 2/10-2/15 - Continue unasyn 2/15 - present  - abdominal wound dressing changes bid  - pain control: home morphine 15-30mg  QID while awake, dilaudid to 1mg  q3h prn for breakthrough pain   Depression: continue home venlafaxine  GERD: home pantoprazole  Dispo: Anticipated discharge today. HH RN, PT, OT have been arranged. She is able to change her own abdominal dressing and flush her own drains.   Isabelle Course, MD 10/13/2018, 10:34 AM Pager: [phone]

## 2018-10-13 NOTE — Progress Notes (Signed)
Attempted to review complete AVS with patient. Patient adamant she must leave now. Was able to review meds and  prescriptions. Patient verbalizes she already knows how to flush the the drain. Refused to review again. Questions answered about new meds and pick up at pharmacy. Home health confirmed. Printed AVS and prescription placed in discharge envelope. All given to patient.  Patient d/c'd to home via wheelchair. Again reviewed the need to pick up prescription from community pharmacy as patient getting in car.

## 2018-10-13 NOTE — Progress Notes (Signed)
Pt refused to do her dressing change this morning. Pt stated "I will do it later!". Will endorse to appropriately.

## 2018-10-13 NOTE — Discharge Summary (Addendum)
Name: Julie Williams MRN: [id number] DOB: 1966/08/16 53 y.o. PCP: Valinda Party, DO  Date of Admission: 10/05/2018  3:45 PM Date of Discharge: 10/13/2018 Attending Physician: Lenice Pressman  Discharge Diagnosis: 1. Intraabdominal abscesses  2. Rectal perforation s/p bowel resection and diverting colostomy 3. Chronic pain on opioids   Discharge Medications: Allergies as of 10/13/2018      Reactions   Bee Venom Swelling      Medication List    STOP taking these medications   albuterol 108 (90 Base) MCG/ACT inhaler Commonly known as:  PROVENTIL HFA;VENTOLIN HFA   amLODipine 5 MG tablet Commonly known as:  NORVASC   calcium carbonate 500 MG chewable tablet Commonly known as:  TUMS - dosed in mg elemental calcium   heparin 5000 UNIT/ML injection   labetalol 100 MG tablet Commonly known as:  NORMODYNE   mouth rinse Liqd solution   ondansetron 4 MG/2ML Soln injection Commonly known as:  ZOFRAN   RESOURCE THICKENUP CLEAR Powd     TAKE these medications   acetaminophen 160 MG/5ML solution Commonly known as:  TYLENOL Place 20.3 mLs (650 mg total) into feeding tube every 6 (six) hours as needed for fever.   amoxicillin-clavulanate 875-125 MG tablet Commonly known as:  AUGMENTIN Take 1 tablet by mouth 2 (two) times daily.   Chlorhexidine Gluconate Cloth 2 % Pads Apply 6 each topically daily.   guaiFENesin 100 MG/5ML Soln Commonly known as:  ROBITUSSIN Take 30 mLs (600 mg total) by mouth every 6 (six) hours.   morphine 30 MG tablet Commonly known as:  MSIR Take 30 mg by mouth every 4 (four) hours as needed for severe pain. What changed:  Another medication with the same name was removed. Continue taking this medication, and follow the directions you see here.   omeprazole 40 MG capsule Commonly known as:  PRILOSEC Take 1 capsule (40 mg total) by mouth daily.   polyethylene glycol powder powder Commonly known as:  GLYCOLAX/MIRALAX DISSOLVE 255  GRAMS INTO LIQUID AND TAKE BY MOUTH ONCE.   QUEtiapine 25 MG tablet Commonly known as:  SEROQUEL Take 1 tablet (25 mg total) by mouth at bedtime.   sodium chloride flush 0.9 % Soln Commonly known as:  NS Inject 10 mLs into the vein as needed.   venlafaxine XR 37.5 MG 24 hr capsule Commonly known as:  EFFEXOR-XR Take 1 capsule (37.5 mg total) by mouth daily with breakfast.       Disposition and follow-up:   Julie Williams was discharged from [facility] in Stable condition.  At the hospital follow up visit please address:  1.  Please make sure she has scheduled a f/u appt with her original surgeon from [facility], Dr. Rogelia Boga [phone]. She has yet to see him since her surgery on Dec 25th and he is hoping to reconnect her bowel at some point.   Provide her with extra pain medicine if she needs it. Her opioid tolerance is very high. She did not want [place] to give her more pain meds at discharge. She said she could handle it herself with her home morphine.   Ensure she is changing her abdominal dressing BID and flushing her drains BID and recording the output. She has a [facility] RN that should be monitoring this every now and then.  Please remove HTN from her problem list. This got carried over from her complicated Jan hospital admission. She was discharged on amlodipine and labetalol at that time but  we held both of these during this admission and her BP was normal.   2.  Labs / imaging needed at time of follow-up: CBC to f/u anemia, repeat abdominal imaging in 4 weeks (ID should order this at her f/u appt March 5th)   3.  Pending labs/ test needing follow-up: none  Follow-up Appointments: [place] Follow up.   Contact information: [address] [place] [address] [phone]        Tommy Medal, Lavell Islam, MD Follow up on 10/29/2018.   Specialty:  Infectious Diseases Why:  3:30 pm appointment  Contact  information: [address] [address] [phone]        Markus Daft, MD Follow up.   Specialties:  Interventional Radiology, Radiology Why:  You will hear from schedulers with date and time of appointment Contact information: [address] [place] [address] [phone]        Marylu Lund., MD. Schedule an appointment as soon as possible for a visit in 2 week(s).   Specialty:  Surgery Contact information: [place] [address] [place] by problem list: 28yoF with recent hospital admission (12/19-1/24) for rectal perforation s/p colostomy complicated by bacteremia, ARDS, aspiration pneumonia, and acute blood loss anemia. Discharged to rehab facility and then admitted 2/5-2/6 for symptomatic anemia Hgb 6.0, given 2U pRBCs and discharged home with [facility] now presents with one day of sob, cough productive of clear mucous, dry heaving, chest congestion, subjective fevers, sore throat, and diaphoresis. Initial eval pertinent for tachycardia HR 154, crackles at the left base, and leukocytosis 16 with left shift.   1. Intraabdominal Abscesses: Presented with URI symptoms, tachycardia, and leukocytosis. Abdominal pain on admission was not very impressive. However, she has a history of hepatic abscess during Jan 2020 admission (treated with IR drainage, IV zosyn, po augmentin) that needed f/u imaging so we went ahead and got an abdominal CT on admission which surprisingly showed multiple loculated fluid collections, suspicious for infected ascites or developing abscesses. IR was consulted and placed perihepatic and perisplenic drains. Repeat imaging showed significant improvement but not complete resolution of abscesses and drain output remained > 10 cc.  Therefore, both drains remained in place at discharge. She was treated with IV zosyn, deescalated to IV unasyn and then discharged on Augmentin for  at least 4 weeks with ID f/u for repeat abdominal imaging. Pain was controlled with home morphine 30mg  q4h and prn IV dilaudid for breakthrough. HH RN, PT, OT were arranged. She displayed the ability to change her own abdominal dressing and flush her own drains prior to discharge. She will f/u with IR to discuss drain removal and f/u with ID to discuss antibiotic duration and repeat abdominal imaging.   Discharge Vitals:   BP (!) 121/94 (BP Location: Right Arm)   Pulse 87   Temp 98.3 F (36.8 C) (Oral)   Resp 16   Ht 5\' 5"  (1.651 m)   Wt 69.9 kg   SpO2 93%   BMI 25.63 kg/m   Pertinent Labs, Studies, and Procedures:   CT a/p 2/10: 1. Multiple loculated fluid collections, suspicious for infected ascites or developing abscesses. 2. Status post left lower quadrant colostomy and Hartmann's pouch, without obstruction. 3. Persistent bibasilar pneumonia with probable confluent infection within the lingula, incompletely imaged. Decreased small bilateral pleural effusions. 4.  Hepatic steatosis and hepatomegaly.  CT a/p 2/14: 1. Successful, near complete drainage of the perihepatic and perisplenic fluid collections following drain placement. 2. Other small intra-abdominal fluid collections are stable. No new or enlarging collections identified. 3. Left pleural effusion has mildly increased in volume. The component in the inferior aspect of the left major fissure and the associated bibasilar airspace opacities have not significantly changed. 4. Grossly stable appearance of open ventral incision.  CBC Latest Ref Rng & Units 10/13/2018 10/12/2018 10/10/2018  WBC 4.0 - 10.5 K/uL - 10.1 10.2  Hemoglobin 12.0 - 15.0 g/dL 8.5(L) 7.2(L) 8.3(L)  Hematocrit 36.0 - 46.0 % 29.1(L) 24.5(L) 27.7(L)  Platelets 150 - 400 K/uL - 619(H) 594(H)     Discharge Instructions: Discharge Instructions    Diet - low sodium heart healthy   Complete by:  As directed    Discharge instructions   Complete by:  As  directed    Julie Williams, Julie Williams were admitted to the hospital for treatment of several abscesses (infected fluid collections) in your abdomen. We placed a drain around the liver and a drain around the spleen. You will need to take an antibiotic by mouth (Augmentin) for at least 4 weeks. I have sent this to your pharmacy.  For your drains, please flush them twice a day. The interventional radiologists will call you to schedule a follow up appointment. They will be the ones to take the drains out when that time comes.   I have made you a hospital follow up appointment in our clinic next week, 2/25 at 10:45am.   Please go to your follow up appointment with the infectious disease doctor March 3rd at 3:30pm. They will get repeat abdominal imaging to determine whether or not you need to continue taking the antibiotic.   Please schedule a follow up appointment with your surgeon, Dr. Rogelia Boga, as soon as possible so he can evaluate your abdominal wound and discuss plans to reconnect your bowel.  P.S. Your hemoglobin was 8.5 this morning which is good! They can recheck it in the clinic next week.   Increase activity slowly   Complete by:  As directed       Signed: Isabelle Course, MD 10/15/2018, 12:55 PM   Pager: [phone]    Internal Medicine Attending Note:  I saw and examined the patient on the day of discharge. I reviewed and agree with the discharge summary written by the house staff.  Lenice Pressman, M.D., Ph.D.

## 2018-10-14 ENCOUNTER — Other Ambulatory Visit: Payer: Self-pay | Admitting: Infectious Disease

## 2018-10-14 ENCOUNTER — Other Ambulatory Visit: Payer: Self-pay

## 2018-10-14 ENCOUNTER — Telehealth: Payer: Self-pay

## 2018-10-14 DIAGNOSIS — K651 Peritoneal abscess: Secondary | ICD-10-CM

## 2018-10-14 NOTE — Telephone Encounter (Signed)
Julie Williams with [facility] requesting VO for nursing and wound care. Please call back.

## 2018-10-14 NOTE — Telephone Encounter (Signed)
Rtc, lm for rtc 

## 2018-10-14 NOTE — Telephone Encounter (Signed)
Levada Dy calls back and ask for VO for wound care HHN eval and call with treatment plan. Do you agree? HHN also states today was first visit w/ pt and she refused to let HHN eval wound. If pt continues to refuse wound care [facility] will disch. Pt is refusing PT, OT.

## 2018-10-14 NOTE — Telephone Encounter (Signed)
Agree with wound care

## 2018-10-15 ENCOUNTER — Other Ambulatory Visit: Payer: Self-pay

## 2018-10-15 NOTE — Patient Outreach (Signed)
Initial telephone outreach: MD office does transition of care. Patient seen while inpatient and consent obtained.  Placed call to patient and explained reason for call. Patient reports she is ok. Reports she is managing her pain. Reports home health nurse came yesterday and opened case. Reports she has not yet had home health PT or OT.. Reports she is doing her own dressing changes.  Reports taking her medications as prescribed. States she has follow up appointments planned for next week. Offered home visit and patient accepted.   PLAN: Patient accepted home visit for 10/19/2018. Confirmed address. Provided my contact information. Will complete full assessment and care planning during face to face visit.  Tomasa Rand, RN, BSN, CEN [facility] [employer] [phone]

## 2018-10-16 ENCOUNTER — Emergency Department (HOSPITAL_COMMUNITY): Payer: PPO

## 2018-10-16 ENCOUNTER — Encounter (HOSPITAL_COMMUNITY): Payer: Self-pay

## 2018-10-16 ENCOUNTER — Emergency Department (HOSPITAL_COMMUNITY)
Admission: EM | Admit: 2018-10-16 | Discharge: 2018-10-16 | Disposition: A | Discharge status: Home / Self Care | Payer: PPO | Attending: Emergency Medicine | Admitting: Emergency Medicine

## 2018-10-16 DIAGNOSIS — R109 Unspecified abdominal pain: Secondary | ICD-10-CM

## 2018-10-16 DIAGNOSIS — R0781 Pleurodynia: Secondary | ICD-10-CM | POA: Diagnosis not present

## 2018-10-16 DIAGNOSIS — R000 Tachycardia, unspecified: Secondary | ICD-10-CM | POA: Diagnosis not present

## 2018-10-16 DIAGNOSIS — K760 Fatty (change of) liver, not elsewhere classified: Secondary | ICD-10-CM | POA: Diagnosis not present

## 2018-10-16 DIAGNOSIS — R079 Chest pain, unspecified: Secondary | ICD-10-CM | POA: Diagnosis not present

## 2018-10-16 DIAGNOSIS — R1031 Right lower quadrant pain: Secondary | ICD-10-CM | POA: Diagnosis not present

## 2018-10-16 DIAGNOSIS — J9811 Atelectasis: Secondary | ICD-10-CM | POA: Diagnosis not present

## 2018-10-16 DIAGNOSIS — Z79899 Other long term (current) drug therapy: Secondary | ICD-10-CM | POA: Insufficient documentation

## 2018-10-16 DIAGNOSIS — R1110 Vomiting, unspecified: Secondary | ICD-10-CM | POA: Diagnosis not present

## 2018-10-16 DIAGNOSIS — I10 Essential (primary) hypertension: Secondary | ICD-10-CM | POA: Insufficient documentation

## 2018-10-16 DIAGNOSIS — Z87891 Personal history of nicotine dependence: Secondary | ICD-10-CM | POA: Diagnosis not present

## 2018-10-16 DIAGNOSIS — R0600 Dyspnea, unspecified: Secondary | ICD-10-CM | POA: Diagnosis not present

## 2018-10-16 DIAGNOSIS — R1033 Periumbilical pain: Secondary | ICD-10-CM | POA: Diagnosis not present

## 2018-10-16 DIAGNOSIS — R071 Chest pain on breathing: Secondary | ICD-10-CM | POA: Diagnosis not present

## 2018-10-16 LAB — CBC WITH DIFFERENTIAL/PLATELET
Abs Immature Granulocytes: 0.06 10*3/uL (ref 0.00–0.07)
Basophils Absolute: 0.1 10*3/uL (ref 0.0–0.1)
Basophils Relative: 0 %
Eosinophils Absolute: 0.1 10*3/uL (ref 0.0–0.5)
Eosinophils Relative: 1 %
HCT: 32.9 % — ABNORMAL LOW (ref 36.0–46.0)
Hemoglobin: 9.6 g/dL — ABNORMAL LOW (ref 12.0–15.0)
Immature Granulocytes: 1 %
Lymphocytes Relative: 38 %
Lymphs Abs: 4.3 10*3/uL — ABNORMAL HIGH (ref 0.7–4.0)
MCH: 28.1 pg (ref 26.0–34.0)
MCHC: 29.2 g/dL — ABNORMAL LOW (ref 30.0–36.0)
MCV: 96.2 fL (ref 80.0–100.0)
MONO ABS: 0.6 10*3/uL (ref 0.1–1.0)
Monocytes Relative: 5 %
Neutro Abs: 6.2 10*3/uL (ref 1.7–7.7)
Neutrophils Relative %: 55 %
Platelets: 912 10*3/uL (ref 150–400)
RBC: 3.42 MIL/uL — ABNORMAL LOW (ref 3.87–5.11)
RDW: 15.1 % (ref 11.5–15.5)
WBC: 11.3 10*3/uL — ABNORMAL HIGH (ref 4.0–10.5)
nRBC: 0 % (ref 0.0–0.2)

## 2018-10-16 LAB — COMPREHENSIVE METABOLIC PANEL
ALBUMIN: 2.3 g/dL — AB (ref 3.5–5.0)
ALT: 13 U/L (ref 0–44)
AST: 24 U/L (ref 15–41)
Alkaline Phosphatase: 89 U/L (ref 38–126)
Anion gap: 12 (ref 5–15)
BUN: <5 mg/dL — ABNORMAL LOW (ref 6–20)
CALCIUM: 9.1 mg/dL (ref 8.9–10.3)
CO2: 25 mmol/L (ref 22–32)
Chloride: 103 mmol/L (ref 98–111)
Creatinine, Ser: 0.82 mg/dL (ref 0.44–1.00)
GFR calc Af Amer: >60 mL/min (ref >60)
Glucose, Bld: 106 mg/dL — ABNORMAL HIGH (ref 70–99)
Potassium: 4.3 mmol/L (ref 3.5–5.1)
Sodium: 140 mmol/L (ref 135–145)
TOTAL PROTEIN: 8.5 g/dL — AB (ref 6.5–8.1)
Total Bilirubin: 0.4 mg/dL (ref 0.3–1.2)

## 2018-10-16 LAB — I-STAT TROPONIN, ED: Troponin i, poc: 0.01 ng/mL (ref 0.00–0.08)

## 2018-10-16 LAB — LIPASE, BLOOD: Lipase: 39 U/L (ref 11–51)

## 2018-10-16 LAB — LACTIC ACID, PLASMA
Lactic Acid, Venous: 2.5 mmol/L (ref 0.5–1.9)
Lactic Acid, Venous: 1.2 mmol/L (ref 0.5–1.9)

## 2018-10-16 MED ORDER — SODIUM CHLORIDE 0.9% FLUSH: 3.0000 mL | Freq: Once | INTRAVENOUS | Status: DC

## 2018-10-16 MED ORDER — ONDANSETRON HCL 4 MG/2ML IJ SOLN
4.0000 mg | Freq: Once | INTRAMUSCULAR | Status: AC
  Administered 2018-10-16: 4 mg via INTRAVENOUS
  Filled 2018-10-16: qty 2

## 2018-10-16 MED ORDER — SODIUM CHLORIDE 0.9 % IV BOLUS
1000.0000 mL | Freq: Once | INTRAVENOUS | Status: AC
  Administered 2018-10-16: 1000 mL via INTRAVENOUS

## 2018-10-16 MED ORDER — ONDANSETRON 4 MG PO TBDP: ORAL_TABLET | ORAL | 0 refills | Status: DC

## 2018-10-16 MED ORDER — MORPHINE SULFATE (PF) 4 MG/ML IV SOLN
4.0000 mg | Freq: Once | INTRAVENOUS | Status: AC
  Administered 2018-10-16: 4 mg via INTRAVENOUS
  Filled 2018-10-16: qty 1

## 2018-10-16 MED ORDER — IOPAMIDOL (ISOVUE-370) INJECTION 76%
INTRAVENOUS | Status: AC
  Administered 2018-10-16: 17:00:00
  Filled 2018-10-16: qty 100

## 2018-10-16 MED ORDER — FENTANYL CITRATE (PF) 100 MCG/2ML IJ SOLN
50.0000 ug | INTRAMUSCULAR | Status: DC | PRN
  Administered 2018-10-16: 50 ug via INTRAVENOUS
  Filled 2018-10-16: qty 2

## 2018-10-16 NOTE — ED Notes (Signed)
Left for xray

## 2018-10-16 NOTE — ED Notes (Addendum)
Patient verbalizes understanding of discharge instructions. Opportunity for questioning and answers were provided. Armband removed by staff, pt discharged from ED by wheelchair   

## 2018-10-16 NOTE — ED Provider Notes (Signed)
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 tablet     10/16/18 1929           Elnora Morrison, MD 10/16/18 1939

## 2018-10-16 NOTE — ED Triage Notes (Signed)
Pt presents for evaluation of ongoing infection, tachycardia and chest pain/sob. Pt reports she was d/c 2 days ago for same. Has bilateral abd drains and ostomy site.

## 2018-10-16 NOTE — ED Notes (Signed)
Pt refusing respiratory PCR

## 2018-10-16 NOTE — Discharge Instructions (Signed)
Follow up with your surgeon and primary doctor. Return for persistent fevers, unable to keep fluids down for >12 hrs or new concerns. Use zofran as needed for vomiting.

## 2018-10-19 ENCOUNTER — Other Ambulatory Visit: Payer: Self-pay

## 2018-10-19 LAB — PATHOLOGIST SMEAR REVIEW
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
## 2018-10-20 ENCOUNTER — Ambulatory Visit: Payer: PPO

## 2018-10-20 ENCOUNTER — Ambulatory Visit (INDEPENDENT_AMBULATORY_CARE_PROVIDER_SITE_OTHER): Payer: PPO | Admitting: Internal Medicine

## 2018-10-20 ENCOUNTER — Inpatient Hospital Stay: Payer: PPO | Admitting: Family

## 2018-10-20 ENCOUNTER — Other Ambulatory Visit: Payer: Self-pay | Admitting: *Deleted

## 2018-10-20 ENCOUNTER — Encounter: Payer: Self-pay | Admitting: Internal Medicine

## 2018-10-20 VITALS — BP 125/93 | HR 114 | Temp 98.0°F | Wt 135.9 lb

## 2018-10-20 DIAGNOSIS — Z792 Long term (current) use of antibiotics: Secondary | ICD-10-CM

## 2018-10-20 DIAGNOSIS — S31109A Unspecified open wound of abdominal wall, unspecified quadrant without penetration into peritoneal cavity, initial encounter: Secondary | ICD-10-CM | POA: Insufficient documentation

## 2018-10-20 DIAGNOSIS — Z933 Colostomy status: Secondary | ICD-10-CM

## 2018-10-20 DIAGNOSIS — Z09 Encounter for follow-up examination after completed treatment for conditions other than malignant neoplasm: Secondary | ICD-10-CM | POA: Insufficient documentation

## 2018-10-20 DIAGNOSIS — Z978 Presence of other specified devices: Secondary | ICD-10-CM

## 2018-10-20 DIAGNOSIS — Z9889 Other specified postprocedural states: Secondary | ICD-10-CM | POA: Diagnosis not present

## 2018-10-20 DIAGNOSIS — Z8619 Personal history of other infectious and parasitic diseases: Secondary | ICD-10-CM | POA: Diagnosis not present

## 2018-10-20 DIAGNOSIS — Z8701 Personal history of pneumonia (recurrent): Secondary | ICD-10-CM | POA: Diagnosis not present

## 2018-10-20 DIAGNOSIS — K651 Peritoneal abscess: Secondary | ICD-10-CM

## 2018-10-20 DIAGNOSIS — Z862 Personal history of diseases of the blood and blood-forming organs and certain disorders involving the immune mechanism: Secondary | ICD-10-CM | POA: Diagnosis not present

## 2018-10-20 DIAGNOSIS — Z8719 Personal history of other diseases of the digestive system: Secondary | ICD-10-CM | POA: Diagnosis not present

## 2018-10-20 NOTE — Telephone Encounter (Signed)
Late Entry: Pt seen in [facility] on 2/26- Asked that CMA send refill request for morphine to pcp.Regenia Skeeter, Trevious Rampey Cassady2/26/20201:07 PM   .

## 2018-10-20 NOTE — Patient Instructions (Signed)
Ms. Chandran,   Your surgeon is arranging for a new wound vac. The one you had originally placed was removed due to an infection at the time.   Your appointment next Thursday is at the drain clinic. They will check your drains and determine if you need to have them removed or not. You also have an appointment with the infectious diseases doctor next week.   Please continue taking Augmentin for a total of 4 weeks.   Call us if you have any questions or concerns.   -Dr. Frederico Hamman

## 2018-10-21 ENCOUNTER — Encounter: Payer: Self-pay | Admitting: Internal Medicine

## 2018-10-21 ENCOUNTER — Telehealth: Payer: Self-pay

## 2018-10-21 NOTE — Telephone Encounter (Signed)
TC received from pt. Pt was seen yesterday in [facility] by Dr. Frederico Hamman.  Pt states she was told by MD she had an appt next week in drain clinic and an appt in Inf disease clinic.  Per Dr. Frederico Hamman office notes from yesterday, this is correct.  Per pt appt schedule, pt has an appt with Inf disease next Thursday, 10/29/18 and several appt's tomorrow in radiology.  Informed pt Nurse will check to see if radiology appt scheduled for tomorrow is also drain clinic appt and call her back.  She verbalized understanding. SChaplin, RN,BSN

## 2018-10-21 NOTE — Telephone Encounter (Signed)
Attempted to contact pt with appt info, no answer-details of appt left on recorder.  Pt scheduled for "drain clinic" on tomorrow 2/27 at [place] located at [address] [employer] [address]).  Pt needs to be there by 12:30pm.  Information was given to patient during office visit yesterday.  She can also call them directly at [phone] if she has additional questions about the appt.  She is also scheduled for CT and needs to be NPO for 4 hours prior to test (liquid for meds ok).  Will attempt to contact pt again this afternoon.Despina Hidden Cassady2/26/20209:54 AM
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
## 2018-10-21 NOTE — Assessment & Plan Note (Addendum)
Julie Williams has a history of rectal perforation in 07/2018 status post diverting colostomy secondary to constipation from chronic opiate therapy.  Her initial admission was complicated by enterococcus bacteremia, ARDS, aspiration pneumonia, hepatic abscess, acute renal failure, and acute blood loss anemia.  She was readmitted 2/5-2/6 for symptomatic anemia and was discharged home after receiving units of blood.  Most recently she was readmitted on 2/10-2/18 with abdominal pain, N/V, and new leukocytosis. This prompted a CT abdomen pelvis that showed increasing fluid collections around her liver and spleen.  2 JP drains replaced by IR with near resolution of fluid collections on repeat imaging.  She was discharged home on 4 weeks of Augmentin per ID recommendations, which she has been compliant with.  She returned to the ED on 2/21 for evaluation for chest pain.  Ischemic work-up was negative.  ED provider repeated CT abdomen chest due to patient's recent history of intra-abdominal infection which shows stable findings.  She is doing well today and is inquiring about possibility of obtaining original wound VAC placed in 07/2018 back.  I explained to her this was removed due to an intra-abdominal infection and that her surgeon will coordinate this with advance home care.  She has been flushing her JP drains without difficulty and has been changing her abdomen dressing twice a day as recommended.  She followed up with general surgery this morning who are planning to place a wound VAC to mid abdomen incision due to slow healing process. She has a follow-up appointment with IR at the drain clinic 2 days as well as with ID next week.

## 2018-10-21 NOTE — Telephone Encounter (Signed)
Received TC from [place], states she is with the patient now and pt has c/o vomitting 2X since last night.  VS:  P 78, b/p 144/86, afebrile.  This RN spoke with pt and she states she vomitted small amount of emesis last night X1 and a small amount of emesis at 1300 today.  States she has emptied her colostomy bag twice today, usually empties bag once during a day.  As of note, this RN spoke to pt earlier today regarding question r/t appt pt had in [facility] yesterday and pt did not have any complaints of N/V.  This RN encouraged PO fluids and instructed pt to call back if symptoms did not resolve or worsened, she verbalized understanding.  Pt also informed of appt at [facility] imaging tomorrow and states she is aware of appt and has contact information for [place] imaging. Will forward to PCP to ascertain if pcp has any additional recommendations.   SChaplin, RN,BSN

## 2018-10-22 ENCOUNTER — Ambulatory Visit
Admission: RE | Admit: 2018-10-22 | Discharge: 2018-10-22 | Disposition: A | Discharge status: Home / Self Care | Payer: PPO | Source: Ambulatory Visit | Attending: Infectious Disease | Admitting: Infectious Disease

## 2018-10-22 ENCOUNTER — Ambulatory Visit
Admission: RE | Admit: 2018-10-22 | Discharge: 2018-10-22 | Disposition: A | Discharge status: Home / Self Care | Payer: PPO | Source: Ambulatory Visit | Attending: Student | Admitting: Student

## 2018-10-22 ENCOUNTER — Other Ambulatory Visit: Payer: Self-pay | Admitting: *Deleted

## 2018-10-22 ENCOUNTER — Encounter: Payer: Self-pay | Admitting: *Deleted

## 2018-10-22 DIAGNOSIS — K651 Peritoneal abscess: Secondary | ICD-10-CM

## 2018-10-22 DIAGNOSIS — T8143XD Infection following a procedure, organ and space surgical site, subsequent encounter: Secondary | ICD-10-CM | POA: Diagnosis not present

## 2018-10-22 DIAGNOSIS — Z433 Encounter for attention to colostomy: Secondary | ICD-10-CM | POA: Diagnosis not present

## 2018-10-22 DIAGNOSIS — K6811 Postprocedural retroperitoneal abscess: Secondary | ICD-10-CM | POA: Diagnosis not present

## 2018-10-22 DIAGNOSIS — R112 Nausea with vomiting, unspecified: Secondary | ICD-10-CM

## 2018-10-22 DIAGNOSIS — K219 Gastro-esophageal reflux disease without esophagitis: Secondary | ICD-10-CM | POA: Diagnosis not present

## 2018-10-22 DIAGNOSIS — K750 Abscess of liver: Secondary | ICD-10-CM | POA: Diagnosis not present

## 2018-10-22 DIAGNOSIS — F1120 Opioid dependence, uncomplicated: Secondary | ICD-10-CM | POA: Diagnosis not present

## 2018-10-22 DIAGNOSIS — I10 Essential (primary) hypertension: Secondary | ICD-10-CM | POA: Diagnosis not present

## 2018-10-22 DIAGNOSIS — F329 Major depressive disorder, single episode, unspecified: Secondary | ICD-10-CM | POA: Diagnosis not present

## 2018-10-22 HISTORY — PX: IR RADIOLOGIST EVAL & MGMT: IMG5224

## 2018-10-22 MED ORDER — PROMETHAZINE HCL 25 MG PO TABS: 25.0000 mg | ORAL_TABLET | Freq: Four times a day (QID) | ORAL | 0 refills | Status: DC | PRN

## 2018-10-22 MED ORDER — IOPAMIDOL (ISOVUE-300) INJECTION 61%
100.0000 mL | Freq: Once | INTRAVENOUS | Status: AC | PRN
  Administered 2018-10-22: 100 mL via INTRAVENOUS

## 2018-10-22 NOTE — Telephone Encounter (Signed)
Call from pt - stated medication ordered by ER doctor ( Ondansetron) is not covered by her insurance. Requesting something else for nausea. Thanks
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
## 2018-10-22 NOTE — Telephone Encounter (Signed)
Promethazine 25 mg by mouth every 6 hours as needed for nausea and vomiting.  Prescription sent to her preferred pharmacy.  Thank You.

## 2018-10-22 NOTE — Progress Notes (Signed)
Internal Medicine Clinic Attending  Case discussed with Dr. Isac Sarna  at the time of the visit.  We reviewed the resident's history and exam and pertinent patient test results.  I agree with the assessment, diagnosis, and plan of care documented in the resident's note.  To clarify, patient is here with a chief complaint of follow up for abdominal abscess.

## 2018-10-23 NOTE — Telephone Encounter (Signed)
Called pt about new Phenergan rx - no answer; left message.

## 2018-10-28 ENCOUNTER — Telehealth: Payer: Self-pay | Admitting: *Deleted

## 2018-10-28 ENCOUNTER — Telehealth: Payer: Self-pay | Admitting: Radiology

## 2018-10-28 NOTE — Telephone Encounter (Signed)
Pt calls and states that she had drain tubes removed last Thursday, this am she has a great amount of pressure in the area like the tube is there but it is gone. Pt states she is very uncomfortable. Referred her to the surgeon, interventional radiology but she states no, they are not the correct ones to take care of this matter. she also then states she tried to call interventional radiology the message machine seemed to infer that the office was closed Please advise?  I did call the [place] imaging office and the rep there stated she had spoken w/ pt and advised her to call the surgeon but that due to pt being so upset she did have her leave a message for the nurse.

## 2018-10-28 NOTE — Telephone Encounter (Signed)
Patient left msg c/o pain at site of abscess drain which was removed 10/22/2018.  Per Dr Vernard Gambles, as both drains have been removed, patient should follow up with her surgeon.  Return call to patient to instruct her to follow up with her surgeon for assessment.    Aviv Rota [place], RN 10/28/2018 11:00 AM

## 2018-10-28 NOTE — Telephone Encounter (Signed)
I agree that this would likely be best addressed by notifying her surgeon, they would be able to decide if repeat interventional radiology or surgery would be needed for a post op complication.

## 2018-10-29 ENCOUNTER — Ambulatory Visit (INDEPENDENT_AMBULATORY_CARE_PROVIDER_SITE_OTHER): Payer: PPO | Admitting: Infectious Disease

## 2018-10-29 ENCOUNTER — Encounter: Payer: Self-pay | Admitting: Infectious Disease

## 2018-10-29 VITALS — BP 112/78 | HR 102 | Temp 98.1°F | Wt 129.0 lb

## 2018-10-29 DIAGNOSIS — B3731 Acute candidiasis of vulva and vagina: Secondary | ICD-10-CM | POA: Insufficient documentation

## 2018-10-29 DIAGNOSIS — K750 Abscess of liver: Secondary | ICD-10-CM | POA: Diagnosis not present

## 2018-10-29 DIAGNOSIS — B952 Enterococcus as the cause of diseases classified elsewhere: Secondary | ICD-10-CM

## 2018-10-29 DIAGNOSIS — K631 Perforation of intestine (nontraumatic): Secondary | ICD-10-CM

## 2018-10-29 DIAGNOSIS — K651 Peritoneal abscess: Secondary | ICD-10-CM

## 2018-10-29 DIAGNOSIS — R7881 Bacteremia: Secondary | ICD-10-CM | POA: Diagnosis not present

## 2018-10-29 DIAGNOSIS — Z933 Colostomy status: Secondary | ICD-10-CM

## 2018-10-29 DIAGNOSIS — B373 Candidiasis of vulva and vagina: Secondary | ICD-10-CM

## 2018-10-29 HISTORY — DX: Candidiasis of vulva and vagina: B37.3

## 2018-10-29 HISTORY — DX: Acute candidiasis of vulva and vagina: B37.31

## 2018-10-29 MED ORDER — FLUCONAZOLE 100 MG PO TABS: 100.0000 mg | ORAL_TABLET | Freq: Every day | ORAL | 2 refills | Status: DC

## 2018-10-29 MED ORDER — AMOXICILLIN-POT CLAVULANATE 875-125 MG PO TABS: 1.0000 | ORAL_TABLET | Freq: Two times a day (BID) | ORAL | 1 refills | Status: DC
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
## 2018-10-30 DIAGNOSIS — D62 Acute posthemorrhagic anemia: Secondary | ICD-10-CM | POA: Diagnosis not present

## 2018-10-30 DIAGNOSIS — K219 Gastro-esophageal reflux disease without esophagitis: Secondary | ICD-10-CM | POA: Diagnosis not present

## 2018-10-30 DIAGNOSIS — M542 Cervicalgia: Secondary | ICD-10-CM | POA: Diagnosis not present

## 2018-10-30 DIAGNOSIS — Z433 Encounter for attention to colostomy: Secondary | ICD-10-CM | POA: Diagnosis not present

## 2018-10-30 DIAGNOSIS — D649 Anemia, unspecified: Secondary | ICD-10-CM | POA: Diagnosis not present

## 2018-10-30 DIAGNOSIS — K651 Peritoneal abscess: Secondary | ICD-10-CM | POA: Diagnosis not present

## 2018-10-30 DIAGNOSIS — Z87891 Personal history of nicotine dependence: Secondary | ICD-10-CM | POA: Diagnosis not present

## 2018-10-30 DIAGNOSIS — M19011 Primary osteoarthritis, right shoulder: Secondary | ICD-10-CM | POA: Diagnosis not present

## 2018-10-30 DIAGNOSIS — F1120 Opioid dependence, uncomplicated: Secondary | ICD-10-CM | POA: Diagnosis not present

## 2018-10-30 DIAGNOSIS — G8929 Other chronic pain: Secondary | ICD-10-CM | POA: Diagnosis not present

## 2018-10-30 DIAGNOSIS — Z8719 Personal history of other diseases of the digestive system: Secondary | ICD-10-CM | POA: Diagnosis not present

## 2018-10-30 DIAGNOSIS — I10 Essential (primary) hypertension: Secondary | ICD-10-CM | POA: Diagnosis not present

## 2018-10-30 DIAGNOSIS — Z4801 Encounter for change or removal of surgical wound dressing: Secondary | ICD-10-CM | POA: Diagnosis not present

## 2018-10-30 DIAGNOSIS — H9190 Unspecified hearing loss, unspecified ear: Secondary | ICD-10-CM | POA: Diagnosis not present

## 2018-10-30 DIAGNOSIS — F329 Major depressive disorder, single episode, unspecified: Secondary | ICD-10-CM | POA: Diagnosis not present

## 2018-10-30 DIAGNOSIS — T8143XD Infection following a procedure, organ and space surgical site, subsequent encounter: Secondary | ICD-10-CM | POA: Diagnosis not present

## 2018-10-30 DIAGNOSIS — R071 Chest pain on breathing: Secondary | ICD-10-CM | POA: Diagnosis not present

## 2018-10-30 DIAGNOSIS — R109 Unspecified abdominal pain: Secondary | ICD-10-CM | POA: Diagnosis not present

## 2018-10-30 DIAGNOSIS — Z9049 Acquired absence of other specified parts of digestive tract: Secondary | ICD-10-CM | POA: Diagnosis not present

## 2018-10-30 LAB — COMPLETE METABOLIC PANEL WITH GFR
AG Ratio: 0.8 (calc) — ABNORMAL LOW (ref 1.0–2.5)
ALT: 33 U/L — ABNORMAL HIGH (ref 6–29)
AST: 30 U/L (ref 10–35)
Albumin: 3.3 g/dL — ABNORMAL LOW (ref 3.6–5.1)
Alkaline phosphatase (APISO): 157 U/L — ABNORMAL HIGH (ref 37–153)
BUN: 9 mg/dL (ref 7–25)
CO2: 26 mmol/L (ref 20–32)
Calcium: 9.4 mg/dL (ref 8.6–10.4)
Chloride: 100 mmol/L (ref 98–110)
Creat: 0.8 mg/dL (ref 0.50–1.05)
GFR, EST AFRICAN AMERICAN: 97 mL/min/{1.73_m2} (ref >=60)
GFR, EST NON AFRICAN AMERICAN: 84 mL/min/{1.73_m2} (ref >=60)
GLOBULIN: 4 g/dL — AB (ref 1.9–3.7)
Glucose, Bld: 115 mg/dL — ABNORMAL HIGH (ref 65–99)
Potassium: 4.8 mmol/L (ref 3.5–5.3)
Sodium: 135 mmol/L (ref 135–146)
Total Bilirubin: 0.3 mg/dL (ref 0.2–1.2)
Total Protein: 7.3 g/dL (ref 6.1–8.1)

## 2018-10-30 LAB — CBC WITH DIFFERENTIAL/PLATELET
Absolute Monocytes: 918 cells/uL (ref 200–950)
BASOS ABS: 45 {cells}/uL (ref 0–200)
Basophils Relative: 0.4 %
Eosinophils Absolute: 123 cells/uL (ref 15–500)
Eosinophils Relative: 1.1 %
HCT: 31.1 % — ABNORMAL LOW (ref 35.0–45.0)
HEMOGLOBIN: 9.9 g/dL — AB (ref 11.7–15.5)
Lymphs Abs: 4480 cells/uL — ABNORMAL HIGH (ref 850–3900)
MCH: 28.9 pg (ref 27.0–33.0)
MCHC: 31.8 g/dL — AB (ref 32.0–36.0)
MCV: 90.7 fL (ref 80.0–100.0)
MPV: 11.4 fL (ref 7.5–12.5)
Monocytes Relative: 8.2 %
NEUTROS ABS: 5634 {cells}/uL (ref 1500–7800)
Neutrophils Relative %: 50.3 %
Platelets: 517 10*3/uL — ABNORMAL HIGH (ref 140–400)
RBC: 3.43 10*6/uL — ABNORMAL LOW (ref 3.80–5.10)
RDW: 14.5 % (ref 11.0–15.0)
Total Lymphocyte: 40 %
WBC: 11.2 10*3/uL — ABNORMAL HIGH (ref 3.8–10.8)

## 2018-10-30 LAB — C-REACTIVE PROTEIN: CRP: 82.5 mg/L — ABNORMAL HIGH (ref <8.0)

## 2018-10-30 LAB — SEDIMENTATION RATE: Sed Rate: >130 mm/h — ABNORMAL HIGH (ref 0–30)

## 2018-11-18 ENCOUNTER — Other Ambulatory Visit: Payer: Self-pay

## 2018-11-18 NOTE — Telephone Encounter (Signed)
morphine (MSIR) 30 MG tablet, refill request @  [id number] [address], [place]. [phone] (Phone) [phone] (Fax)   Would like a call back.

## 2018-11-19 ENCOUNTER — Encounter: Payer: PPO | Admitting: Internal Medicine

## 2018-11-19 ENCOUNTER — Telehealth: Payer: Self-pay | Admitting: Behavioral Health

## 2018-11-19 ENCOUNTER — Other Ambulatory Visit: Payer: Self-pay | Admitting: Internal Medicine

## 2018-11-19 MED ORDER — MORPHINE SULFATE 30 MG PO TABS: 30.0000 mg | ORAL_TABLET | ORAL | 0 refills | Status: DC | PRN

## 2018-11-19 NOTE — Telephone Encounter (Signed)
Patient called wanting to see if she has to come in the office for her appointment Tuesday 11/24/2018.  Patient states she has a wound VAC and states wound care nurse can take pictures and send them to Dr. Tommy Medal.  She states the nurse will come to her home before her office visit and she does not want to have to take the wound vac on and off.   She states she "does not want to come in the office because she does not want to get sick."  Let her know will see if her office visit can be an Evist Informed her Dr. Tommy Medal would be notified to see if there are other options for her. Pricilla Riffle RN

## 2018-11-20 NOTE — Telephone Encounter (Signed)
This is fine with me   

## 2018-11-20 NOTE — Telephone Encounter (Signed)
Prescription sent, not due until April 7th

## 2018-11-23 ENCOUNTER — Other Ambulatory Visit: Payer: Self-pay | Admitting: Internal Medicine

## 2018-11-23 DIAGNOSIS — Z933 Colostomy status: Secondary | ICD-10-CM | POA: Diagnosis not present

## 2018-11-23 NOTE — Telephone Encounter (Signed)
Called patient to attempt to set up an EVISIT.  Patient states she does not see the need to see Dr. Tommy Medal because she does not have an infection.  She states she has a CT that showed she does not have an infection.  She states she has seen her surgeon who states her wound looks good and is not infected.  Patient states she was put on Augmentin but states she does not know why she is taking it because she does not have an infection.  Explained to patient she could discuss this with Dr. Tommy Medal during the EVISIT however patient states she does not see the need and cancelled her appointment.  Informed her Dr. Tommy Medal would be made aware. Pricilla Riffle RN

## 2018-11-23 NOTE — Telephone Encounter (Signed)
Pt is calling regarding pain medicine; pls return call [phone]

## 2018-11-24 ENCOUNTER — Ambulatory Visit: Payer: PPO | Admitting: Infectious Disease

## 2018-11-24 ENCOUNTER — Other Ambulatory Visit: Payer: Self-pay | Admitting: Internal Medicine

## 2018-11-24 MED ORDER — MORPHINE SULFATE 30 MG PO TABS: 30.0000 mg | ORAL_TABLET | ORAL | 0 refills | Status: DC | PRN

## 2018-11-24 NOTE — Telephone Encounter (Signed)
Pt called / informed 3 rxs were sent in and she should be ok until July per Dr Heber [place]. Pt stated "Thank- you".

## 2018-11-24 NOTE — Telephone Encounter (Signed)
Called pt - informed Morphine was refilled on 3/26. She wanted to know why 3 rxs were not sent to her pharmacy as usual; stated she "does not want to run out". Told her I will ask her doctor.

## 2018-11-24 NOTE — Telephone Encounter (Signed)
3 prescriptions have been sent total.  Good through July

## 2018-11-24 NOTE — Telephone Encounter (Signed)
Requesting to speak with a nurse about pain meds. Please call back.

## 2018-11-24 NOTE — Telephone Encounter (Signed)
That is fine with me if she really think she does not have an infection though she should stop her Augmentin but it is kind of difficult for me to weigh in on this without being able to look through her chart and talk to the patient.

## 2018-11-25 ENCOUNTER — Telehealth: Payer: Self-pay

## 2018-11-25 ENCOUNTER — Encounter: Payer: Self-pay | Admitting: *Deleted

## 2018-11-25 ENCOUNTER — Other Ambulatory Visit: Payer: Self-pay | Admitting: *Deleted

## 2018-11-25 NOTE — Telephone Encounter (Signed)
Returned call to [place] at [facility]. Last notes where [facility] is mentioned is from 10/14/2018 where patient refuses PT and OT and will not allow nurse to evaluate wound and 10/21/2018 where [facility] nurse called to report patient's c/o N/V. No further mention of HH. Kayleen Memos states that a [facility] went to patient's home recently and did not think patient required [facility] services. Hubbard Hartshorn, RN, BSN

## 2018-11-25 NOTE — Patient Outreach (Signed)
Incoming call from [facility] to report no home health orders from there in the month of March. There was also mention that pt reported that she had refused home health nursing and PT in February.  Incoming call from Laurelyn Sickle, Practice Coordinator at [facility]. Mrs. Loletha Grayer verifies that Ms. Riches is a challenging pt. She has indeed refused to have home health nursing and PT as she says she can take care of her surgical wound herself. She was very upset about the copayments for the wound vac and Butch Penny did reach out to the wound vac company to see if they would wave any of the charges but she did not hear back from them or further calls from the pt complaining of further difficulties with that. Ms. Loletha Grayer does report that Ms. Kimbler has had outbursts in the office when she does not get want she wants. Mrs. Loletha Grayer reports that the origianl cause of pts abdominal problem and subsequent surgeries have been due to opiod induced constipation. She became constipated and her colon ruptured. Now with the wound vac which she needs desperately to help heal her wound in the proper way. If she does not have the wound vac that would lead to very poor outcomes. This same scenario could also play out again as she continues to be on the opiates.  We discussed the possibility of offering her counseling with one of our LCSW which would be a free service if we can get a toe in the door.  For now we will stand by for future opportunities to arise for attempting engagement .  Eulah Pont. Myrtie Neither, MSN, [facility] Gerontological Nurse Practitioner [facility] Care Management [phone]

## 2018-11-25 NOTE — Telephone Encounter (Signed)
Julie Williams with [facility] want to know if there's any home health order for the month of March. Please call back.

## 2018-11-25 NOTE — Telephone Encounter (Signed)
Called patient, informed her she can stop her Augmentin however it is difficulty for Dr. Tommy Medal to determine if he is unable to see her in person or speak with her to do a visit.  Patient state she will continue to follow up with her other providers and call if she has any questions or concerns. Pricilla Riffle RN

## 2018-11-28 DIAGNOSIS — K631 Perforation of intestine (nontraumatic): Secondary | ICD-10-CM | POA: Diagnosis not present

## 2018-12-07 DIAGNOSIS — T8141XD Infection following a procedure, superficial incisional surgical site, subsequent encounter: Secondary | ICD-10-CM | POA: Diagnosis not present

## 2018-12-07 DIAGNOSIS — K651 Peritoneal abscess: Secondary | ICD-10-CM | POA: Diagnosis not present

## 2018-12-07 DIAGNOSIS — Z433 Encounter for attention to colostomy: Secondary | ICD-10-CM | POA: Diagnosis not present

## 2018-12-10 ENCOUNTER — Ambulatory Visit: Payer: PPO | Admitting: Infectious Disease

## 2018-12-17 DIAGNOSIS — K651 Peritoneal abscess: Secondary | ICD-10-CM | POA: Diagnosis not present

## 2018-12-17 DIAGNOSIS — T8141XD Infection following a procedure, superficial incisional surgical site, subsequent encounter: Secondary | ICD-10-CM | POA: Diagnosis not present

## 2018-12-17 DIAGNOSIS — Z433 Encounter for attention to colostomy: Secondary | ICD-10-CM | POA: Diagnosis not present

## 2019-01-04 DIAGNOSIS — Z933 Colostomy status: Secondary | ICD-10-CM | POA: Diagnosis not present

## 2019-01-05 DIAGNOSIS — Z933 Colostomy status: Secondary | ICD-10-CM | POA: Diagnosis not present

## 2019-01-05 DIAGNOSIS — I872 Venous insufficiency (chronic) (peripheral): Secondary | ICD-10-CM | POA: Diagnosis not present

## 2019-01-06 ENCOUNTER — Ambulatory Visit (INDEPENDENT_AMBULATORY_CARE_PROVIDER_SITE_OTHER): Payer: PPO | Admitting: Internal Medicine

## 2019-01-06 ENCOUNTER — Other Ambulatory Visit: Payer: Self-pay

## 2019-01-06 VITALS — BP 134/98 | HR 92 | Temp 98.1°F | Wt 129.3 lb

## 2019-01-06 DIAGNOSIS — Z9889 Other specified postprocedural states: Secondary | ICD-10-CM

## 2019-01-06 DIAGNOSIS — L608 Other nail disorders: Secondary | ICD-10-CM

## 2019-01-06 DIAGNOSIS — M549 Dorsalgia, unspecified: Secondary | ICD-10-CM

## 2019-01-06 DIAGNOSIS — Z933 Colostomy status: Secondary | ICD-10-CM | POA: Diagnosis not present

## 2019-01-06 DIAGNOSIS — G8929 Other chronic pain: Secondary | ICD-10-CM

## 2019-01-06 DIAGNOSIS — L659 Nonscarring hair loss, unspecified: Secondary | ICD-10-CM | POA: Insufficient documentation

## 2019-01-06 DIAGNOSIS — Z79891 Long term (current) use of opiate analgesic: Secondary | ICD-10-CM | POA: Diagnosis not present

## 2019-01-06 MED ORDER — FERROUS SULFATE 325 (65 FE) MG PO TBEC: 325.0000 mg | DELAYED_RELEASE_TABLET | Freq: Every day | ORAL | 1 refills | Status: DC
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
## 2019-01-06 NOTE — Patient Instructions (Addendum)
[name],  It was a pleasure to see you today. Thank you for coming in.   Today we discussed your hair thinning and loss. In regards to this we have checked some labs that may be contributing to this. We will call you with the results. This may be related to some iron deficiency, I have sent in a prescription for iron but you can also just pick it up over the counter if you need to. I can refill your pain medications when they are needed in June, just call the clinic to let us know.   Please return to clinic in 3 month or as needed.   Thank you again for coming in.   Asencion Noble.D.

## 2019-01-06 NOTE — Assessment & Plan Note (Signed)
Patient reports that for the 2 months now she has noticed thinning of her hair and hair loss, she is also noticed thinning of her nails.  She reports that she feels like her hair loss is getting worse, also reports some weight loss due to her surgery.  She had a colon resection status post colostomy in December 2019 and has recently had a intra-abdominal abscess in February 2020.  Since that time she has been taking Centrum Silver, vitamin E, and biotin.  She denies any changes in her diet, denies any specific diet such as we get is a more vegetarian is now, is drinking Ensure.  She denies any fevers, chills, thin skin, heat or cold intolerance, voice changes, any lumps or bumps, or any nausea or vomiting.  Denies recent travel or sick contacts.  On exam she does appear to have diffuse thinning of her hair and was able to pull out small pieces of hair.  She also has some thin nails.  Thyroid exam was unremarkable.  During 1 of her previous hospitalization she was noted to be anemic and received iron transfusion as well as PRBCs, was also noted to have mildly increased TSH of 6.125 on her last hospitalization. Given her new findings this is concerning for possible iron deficiency contributing to her symptoms.  We will evaluate for this as well as check her TSH and thyroid hormones.  Plan -Check TSH, free T4, T3 -Check CBC, BMP, ferritin, and iron panel -Place patient to start taking first sulfate 325

## 2019-01-07 LAB — IRON AND TIBC
Iron Saturation: 46 % (ref 15–55)
Iron: 112 ug/dL (ref 27–159)
Total Iron Binding Capacity: 246 ug/dL — ABNORMAL LOW (ref 250–450)
UIBC: 134 ug/dL (ref 131–425)

## 2019-01-07 LAB — CBC
Hematocrit: 45.4 % (ref 34.0–46.6)
Hemoglobin: 15.7 g/dL (ref 11.1–15.9)
MCH: 33.1 pg — ABNORMAL HIGH (ref 26.6–33.0)
MCHC: 34.6 g/dL (ref 31.5–35.7)
MCV: 96 fL (ref 79–97)
Platelets: 267 10*3/uL (ref 150–450)
RBC: 4.75 x10E6/uL (ref 3.77–5.28)
RDW: 14 % (ref 11.7–15.4)
WBC: 8.8 10*3/uL (ref 3.4–10.8)

## 2019-01-07 LAB — T4, FREE: Free T4: 1.22 ng/dL (ref 0.82–1.77)

## 2019-01-07 LAB — BMP8+ANION GAP
Anion Gap: 19 mmol/L — ABNORMAL HIGH (ref 10.0–18.0)
BUN/Creatinine Ratio: 24 — ABNORMAL HIGH (ref 9–23)
BUN: 19 mg/dL (ref 6–24)
CO2: 20 mmol/L (ref 20–29)
Calcium: 10.2 mg/dL (ref 8.7–10.2)
Chloride: 98 mmol/L (ref 96–106)
Creatinine, Ser: 0.78 mg/dL (ref 0.57–1.00)
GFR calc Af Amer: 99 mL/min/{1.73_m2} (ref >59)
GFR calc non Af Amer: 86 mL/min/{1.73_m2} (ref >59)
Glucose: 114 mg/dL — ABNORMAL HIGH (ref 65–99)
Potassium: 5 mmol/L (ref 3.5–5.2)
Sodium: 137 mmol/L (ref 134–144)

## 2019-01-07 LAB — T3: T3, Total: 131 ng/dL (ref 71–180)

## 2019-01-07 LAB — TSH: TSH: 6.49 u[IU]/mL — ABNORMAL HIGH (ref 0.450–4.500)

## 2019-01-07 LAB — FERRITIN: Ferritin: 1087 ng/mL — ABNORMAL HIGH (ref 15–150)

## 2019-01-07 NOTE — Progress Notes (Signed)
Medicine attending: Medical history, presenting problems, physical findings, and medications, reviewed with resident physician Dr Lonia Skinner on the day of the patient visit and I concur with her evaluation and management planPatient known to me from 3/20 hospitalization. Hx of bowel perf. Emercency surgery/colostomy, outside hospital in Jan. Admitted in March here w abdoinal pain, fever, thrombocytosis, found to have intraabdominal abscesses; required percutaneous drainage; on chronic opiates for chronic back pain. Overall doing well. C/O alpocia & brittle nails - this is usually sign of iron deficiency. We will check CBC, iron studies, thyroid function; begin iron supplement.

## 2019-01-11 ENCOUNTER — Other Ambulatory Visit: Payer: Self-pay | Admitting: Internal Medicine

## 2019-01-11 DIAGNOSIS — L659 Nonscarring hair loss, unspecified: Secondary | ICD-10-CM

## 2019-01-11 MED ORDER — LEVOTHYROXINE SODIUM 25 MCG PO TABS: 25.0000 ug | ORAL_TABLET | Freq: Every day | ORAL | 0 refills | Status: DC

## 2019-01-11 NOTE — Addendum Note (Signed)
Addended by: Asencion Noble on: 01/11/2019 04:59 PM   Modules accepted: Orders

## 2019-01-12 ENCOUNTER — Telehealth: Payer: Self-pay

## 2019-01-12 NOTE — Telephone Encounter (Signed)
Called pt per dr Sherry Ruffing, pt complained about staff and doctor not having it together. She states she knows no one listens. If they did "that little doctor I saw last would be calling..." spoke w/ her for 30 mins. Reassured pt that the lab she is to have is a more intense look at her possible thyroid issues. That the lab is for her benefit and will help the doctors get to the "root of the problem". She ask lots of questions about thyroid and complained about her colostomy, surgery, the rest of her health problems. Tried to reassure her about all of them. Scheduled lab appt tomorrow 1000- she had cancelled because she felt the doctor didn't know what she was talking about.

## 2019-01-12 NOTE — Addendum Note (Signed)
Addended by: Truddie Crumble on: 01/12/2019 11:51 AM   Modules accepted: Orders

## 2019-01-12 NOTE — Telephone Encounter (Signed)
Requesting to speak with Dr. Sherry Ruffing about lab order. Please call pt back.

## 2019-01-13 ENCOUNTER — Other Ambulatory Visit: Payer: PPO

## 2019-01-13 ENCOUNTER — Other Ambulatory Visit: Payer: Self-pay

## 2019-01-13 ENCOUNTER — Other Ambulatory Visit (INDEPENDENT_AMBULATORY_CARE_PROVIDER_SITE_OTHER): Payer: PPO

## 2019-01-13 DIAGNOSIS — L659 Nonscarring hair loss, unspecified: Secondary | ICD-10-CM | POA: Diagnosis not present

## 2019-01-14 LAB — THYROID PEROXIDASE ANTIBODY: Thyroperoxidase Ab SerPl-aCnc: 37 IU/mL — ABNORMAL HIGH (ref 0–34)

## 2019-01-19 ENCOUNTER — Telehealth: Payer: Self-pay

## 2019-01-19 NOTE — Telephone Encounter (Signed)
Requesting lab results. Please call back.  

## 2019-01-21 NOTE — Telephone Encounter (Signed)
Requesting lab results. Please call back.  

## 2019-01-21 NOTE — Telephone Encounter (Signed)
Will defer to Dr. Heber [place] to call with results given she is the PCP and is on tele visits today

## 2019-01-22 NOTE — Telephone Encounter (Signed)
Pt calling back to request her lab results; stated it should not take this long.  Text message sent to Dr Heber [place]; Dr Sherry Ruffing is not here. Thanks

## 2019-01-22 NOTE — Telephone Encounter (Signed)
Per Chart review she was seen in the [facility] on 5/13 and had labs drawn.  These results were relayed to the patient.  One additional lab was added on, thyroid peroxidase antibody.  This was very minimally elevated.  This will likely need to be repeated in the future.  At this time she should continue to take levothyroxine as prescribed.  Will also discuss with attending and call patient if anything needs to be done sooner.    Please relay to patient this information.

## 2019-01-25 NOTE — Telephone Encounter (Signed)
Julie Williams has a tele-health appointment on the 18th.  Please moves this up to the 4th and I can address her concerns at that time.

## 2019-01-25 NOTE — Telephone Encounter (Signed)
Called pt -stated she was told of previous lab results. Informed  "thyroid peroxidase antibody.  This was very minimally elevated.  This will likely need to be repeated in the future.  At this time she should continue to take levothyroxine as prescribed." per Dr Heber [place]. Pt has additional questions; she would like a call from her doctor.

## 2019-01-26 NOTE — Telephone Encounter (Signed)
I was not the provider who saw  Julie Williams when she was evaluated for hair loss. The results have been relayed to the patient from that visit.   It may be best if Dr. Sherry Ruffing would call her since she evaluated her for hair loss.  I do not know why she is loosing her hair.  I would need to re-take a history and I would be happy to do this during a tele-health visit especially since she is due for a continuity visit but it sounds like she does not want to do this.  Again I would recommend Dr. Sherry Ruffing follow up with the patient.  If patient only wants in person appointments then she could be scheduled in the [facility] for follow ups.

## 2019-01-26 NOTE — Telephone Encounter (Signed)
Called pt - asked if she could changed her telehealth visit from the 18th to the 4th per Dr Heber [place]. Pt became upset - stating she did not schedule a telehealth and does not want a telehealth visit to talk to her doctor. Stated she needs to talk to her in person. Asked why we were doing telehealth visits - I explained about covid-19 and pts coming into the hospital. Stated if she cannot talk to her doctor in person maybe she needs to find another doctor.  In person appt scheduled for Thurs 6/4 @ 1345 PM.

## 2019-01-26 NOTE — Telephone Encounter (Signed)
[place] office informed me Dr Magdalene River is only doing telehealth visits. I called pt - she does not want to do a telehealth visit - Thursday appt canceled. She stated if her doctor cannot call her with her results and explain why she loosing her hair; she does not want a tele-call.

## 2019-01-27 NOTE — Telephone Encounter (Signed)
Spoke with patient, informed her of the results of the thyroid labs, with the minimally decreased TSH and mildly elevated TPO. Discussed that this may not be the cause of her hair loss however since she has been having symptoms she should continue taking her thyroid hormones. We also discussed that her recent stress from the her abdominal surgery may be contributing as well. I informed her that she will need further evaluation to discuss her hair loss however she declined any visits at this time. She does not want a tele-health visit and does not see the point of having another in person [facility] visit. She was very frustrated with how long it took to receive the lab results, I apologized for the delay and explained that it did not change the current management, advised her to continue taking the thyroid medication for now. She will need follow up in 1-2 months to repeat labs.

## 2019-01-28 ENCOUNTER — Encounter: Payer: PPO | Admitting: Internal Medicine

## 2019-01-28 ENCOUNTER — Other Ambulatory Visit: Payer: Self-pay | Admitting: Internal Medicine

## 2019-01-28 DIAGNOSIS — L659 Nonscarring hair loss, unspecified: Secondary | ICD-10-CM

## 2019-02-04 ENCOUNTER — Other Ambulatory Visit: Payer: Self-pay | Admitting: *Deleted

## 2019-02-04 ENCOUNTER — Other Ambulatory Visit: Payer: Self-pay | Admitting: Internal Medicine

## 2019-02-04 DIAGNOSIS — L659 Nonscarring hair loss, unspecified: Secondary | ICD-10-CM

## 2019-02-04 MED ORDER — FERROUS SULFATE 325 (65 FE) MG PO TBEC: 325.0000 mg | DELAYED_RELEASE_TABLET | Freq: Every day | ORAL | 0 refills | Status: DC

## 2019-02-05 ENCOUNTER — Telehealth: Payer: Self-pay | Admitting: Internal Medicine

## 2019-02-05 NOTE — Telephone Encounter (Signed)
Patient requesting a call back because her iron was refilled and she was told to stop it.  Needs to know she should resume taking that medication.  And also requesting refill on morphine (MSIR) 30 MG tablet at  [id number] [address], [place]. [phone] (Phone) [phone] (Fax)

## 2019-02-05 NOTE — Telephone Encounter (Signed)
She should stop the iron.  I will d/c from her chart.   She just received a refill of her morphine on 01/30/19.  She should wait till closer to her refill date (end of the month) for further refills.   Thank you!

## 2019-02-05 NOTE — Telephone Encounter (Signed)
Thank you :)

## 2019-02-05 NOTE — Telephone Encounter (Signed)
Called pt - informed to stop taking iron and the doctor will rem it from her chart. Also informed to call back near end of the month b/c it's too soon to refill; pt stated ok.

## 2019-02-05 NOTE — Telephone Encounter (Signed)
Last rx written  11/24/18. Last OV  5/13. Next OV  6/18. UDS 09/04/17.  She also has question about taking iron medication. Thanks

## 2019-02-08 DIAGNOSIS — Z933 Colostomy status: Secondary | ICD-10-CM | POA: Diagnosis not present

## 2019-02-10 ENCOUNTER — Other Ambulatory Visit: Payer: Self-pay | Admitting: Internal Medicine

## 2019-02-10 NOTE — Telephone Encounter (Signed)
Called transferred from [facility], because patient is upset that she was not informed upon scheduling her appointment with Dr. Kalman Shan it would be via telehealth only.  Patient wants noted she is not willing to do any telehealth visits in the future and only in person.  Cancelling appointment for tomorrow.  Patient is requesting lab tests to have her thyroid check feels she does not need to be on medication for that.  Also requesting refill on morphine (MSIR) 30 MG tablet at  [id number] [address], [place]. [phone] (Phone) [phone] (Fax)   She knows that it is not due until 03/01/2019, but wants it sent to the pharmacy.  Forwarding message to triage nurse.

## 2019-02-11 ENCOUNTER — Encounter: Payer: PPO | Admitting: Internal Medicine

## 2019-02-15 MED ORDER — MORPHINE SULFATE 30 MG PO TABS: 30.0000 mg | ORAL_TABLET | ORAL | 0 refills | Status: DC | PRN

## 2019-02-15 NOTE — Telephone Encounter (Signed)
Will send refill in for morphine.  She can be scheduled in the [facility] to be seen for her lab requests. Thank you

## 2019-02-18 NOTE — Telephone Encounter (Signed)
[place] office called patient to schedule appt in [facility]. Transferred call to triage. Patient prefers to have a lab only appt first so that when she comes for [facility] visit she will have the results to discuss with [facility] provider. States she has not had thyroid checked since starting on thyroid medication. Her major complaint is her hair falling out. Will route to PCP to see if future lab orders can be placed. Hubbard Hartshorn, RN, BSN

## 2019-02-22 ENCOUNTER — Other Ambulatory Visit: Payer: Self-pay | Admitting: Internal Medicine

## 2019-02-22 ENCOUNTER — Encounter: Payer: Self-pay | Admitting: *Deleted

## 2019-02-22 ENCOUNTER — Telehealth: Payer: Self-pay | Admitting: Gastroenterology

## 2019-02-22 ENCOUNTER — Other Ambulatory Visit: Payer: Self-pay

## 2019-02-22 ENCOUNTER — Telehealth: Payer: Self-pay

## 2019-02-22 ENCOUNTER — Other Ambulatory Visit (INDEPENDENT_AMBULATORY_CARE_PROVIDER_SITE_OTHER): Payer: PPO

## 2019-02-22 DIAGNOSIS — L659 Nonscarring hair loss, unspecified: Secondary | ICD-10-CM | POA: Diagnosis not present

## 2019-02-22 DIAGNOSIS — K626 Ulcer of anus and rectum: Secondary | ICD-10-CM | POA: Diagnosis not present

## 2019-02-22 NOTE — Telephone Encounter (Signed)
Pt notified and states she is on her way. SChaplin, RN,BSN

## 2019-02-22 NOTE — Telephone Encounter (Signed)
Received TC from pt.  States she is in town and wants to have her thyroid labwork , states she called about this on 02/10/19 (please see encounter note).  No pending labs entered, no response from the 02/10/19 encounter note from MD regarding labwork.  Pt upset, states "this is ridiculous, I should be able to come have my thyroid checked before having my [facility] visit, otherwise I will not have anything to discuss with the MD". Pt expresses disappointment with [facility], states "she is in town today and will wait a few minutes before heading back home in hopes of MD approving lab only appt today".  Will forward to PCP and attendings to advise on possible lab today. Thank you, SChaplin, RN,BSN

## 2019-02-22 NOTE — Telephone Encounter (Signed)
TSH, fT4, and T3 ordered.

## 2019-02-22 NOTE — Telephone Encounter (Signed)
FYI:  Pt called looking to schedule a colonoscopy, her last one was in 2018. She stated that in December of last year she had a bowel obstruction and had to have a colostomy placed. Her colon rectal surgeon wants her to have a colonoscopy before the reversal. I tried to schedule pt for a e-visit but she declined stating that she has already been seen by her surgeon and she only needed a colonoscopy. I tried to explained to her the reason why she needed an appt first but she became frustrated and raised her voice telling me that I was not understanding her and all we wanted was her money and she was not going to waste her time. She stated that [facility] was willing to schedule the procedure directly. She did not let me continue talking and hang up on me.

## 2019-02-22 NOTE — Telephone Encounter (Signed)
Needs to speak with a nurse about lab and appt. Please call pt back.

## 2019-02-22 NOTE — Telephone Encounter (Signed)
TSH and Free T4 order placed.

## 2019-02-22 NOTE — Addendum Note (Signed)
Addended by: Truddie Crumble on: 02/22/2019 10:14 AM   Modules accepted: Orders

## 2019-02-22 NOTE — Addendum Note (Signed)
Addended by: Kalman Shan R on: 02/22/2019 03:53 PM   Modules accepted: Orders

## 2019-02-23 LAB — TSH+FREE T4
Free T4: 1.01 ng/dL (ref 0.82–1.77)
TSH: 0.758 u[IU]/mL (ref 0.450–4.500)

## 2019-02-23 LAB — T3: T3, Total: 227 ng/dL — ABNORMAL HIGH (ref 71–180)

## 2019-02-25 ENCOUNTER — Encounter: Payer: Self-pay | Admitting: *Deleted

## 2019-03-04 ENCOUNTER — Telehealth: Payer: Self-pay | Admitting: Internal Medicine

## 2019-03-04 NOTE — Telephone Encounter (Signed)
I spoke with Julie Williams about thyroid function testing. She was started on Synthroid 22mcg daily due to a mildly elevated TSH, subclinical hypothyroidism, slight elevation in TPO antibodies, and nonspecific symptom of hair loss. After a month of taking, TSH is now in normal range. However, Julie Williams reports hair loss is only getting worse. After our discussion, she decided not to continue taking synthroid as it is only going to lead to more frequent blood work and does not seem to be addressing her symptom. I think this is reasonable.   I encouraged her to follow up with her new PCP. She is feeling frustrated right now with difficulty being seen due to COVID restrictions. Looks like she has an appointment on 7/23.

## 2019-03-04 NOTE — Telephone Encounter (Signed)
Pt is requesting results [phone]

## 2019-03-11 ENCOUNTER — Encounter: Payer: PPO | Admitting: Internal Medicine

## 2019-03-18 ENCOUNTER — Encounter: Payer: Self-pay | Admitting: Internal Medicine

## 2019-03-18 ENCOUNTER — Other Ambulatory Visit: Payer: Self-pay

## 2019-03-18 ENCOUNTER — Ambulatory Visit (INDEPENDENT_AMBULATORY_CARE_PROVIDER_SITE_OTHER): Payer: PPO | Admitting: Internal Medicine

## 2019-03-18 VITALS — BP 135/93 | HR 78 | Ht 65.0 in | Wt 134.2 lb

## 2019-03-18 DIAGNOSIS — L659 Nonscarring hair loss, unspecified: Secondary | ICD-10-CM

## 2019-03-18 DIAGNOSIS — Z79899 Other long term (current) drug therapy: Secondary | ICD-10-CM

## 2019-03-18 DIAGNOSIS — F332 Major depressive disorder, recurrent severe without psychotic features: Secondary | ICD-10-CM | POA: Diagnosis not present

## 2019-03-18 DIAGNOSIS — Z79890 Hormone replacement therapy (postmenopausal): Secondary | ICD-10-CM | POA: Diagnosis not present

## 2019-03-18 DIAGNOSIS — F330 Major depressive disorder, recurrent, mild: Secondary | ICD-10-CM

## 2019-03-18 MED ORDER — SERTRALINE HCL 25 MG PO TABS: 25.0000 mg | ORAL_TABLET | Freq: Every day | ORAL | 2 refills | Status: DC

## 2019-03-18 NOTE — Patient Instructions (Addendum)
You were seen today for a follow-up visit for your hair loss and depression. Please continue taking Synthroid 28mcg to help with the hair loss. You can begin taking Zoloft 25mg  for depression. Please follow-up in 3 months.

## 2019-03-18 NOTE — Assessment & Plan Note (Signed)
HPI - pt stated she has been severely depressed recently and her current antidepressant isn't working. She describes her mood as "angry and depressed". She denies weight changes, insomnia, or changes in appetitive. Pt was previously switched from celexa to venlafaxine due to medication side effects in Nov 2019. Per chart review, she stopped taking the venlafaxine in February. However, she didn't remember that it was stopped and didn't know why.  Assessment and Plan- Pt's PHQ-9 is 14 today. Patient was agreeable to starting sertraline 25mg  daily. Will plan for follow-up in 3 months.
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
## 2019-03-18 NOTE — Assessment & Plan Note (Addendum)
HPI - pt complains of continued hair loss that has been going on for 5 months now. She states that the hair loss is worsening, though she is seeing some smaller hairs beginning to grow. Today, she would not remove her wig for me to exam her hair and head. She has been taking Centrum Silver, biotin, and iron supplements at home which have not helped. She has not tried any over-the-counter topical minoxidil. Pt has been taking Synthroid 45mcg daily and her recent TSH three weeks ago was normal.  Plan - advised pt to continue taking Synthroid to see if she sees any improvement in the coming months.

## 2019-03-19 NOTE — Progress Notes (Signed)
Internal Medicine Clinic Attending  I saw and evaluated the patient.  I personally confirmed the key portions of the history and exam documented by Dr. Ruppert and I reviewed pertinent patient test results.  The assessment, diagnosis, and plan were formulated together and I agree with the documentation in the resident's note.     

## 2019-03-25 DIAGNOSIS — Z933 Colostomy status: Secondary | ICD-10-CM | POA: Diagnosis not present

## 2019-03-27 DIAGNOSIS — K5790 Diverticulosis of intestine, part unspecified, without perforation or abscess without bleeding: Secondary | ICD-10-CM

## 2019-03-27 HISTORY — DX: Diverticulosis of intestine, part unspecified, without perforation or abscess without bleeding: K57.90

## 2019-04-07 ENCOUNTER — Encounter: Payer: Self-pay | Admitting: Gastroenterology

## 2019-04-07 ENCOUNTER — Ambulatory Visit (INDEPENDENT_AMBULATORY_CARE_PROVIDER_SITE_OTHER): Payer: PPO | Admitting: Gastroenterology

## 2019-04-07 VITALS — BP 108/76 | HR 80 | Temp 98.6°F | Ht 65.5 in | Wt 135.0 lb

## 2019-04-07 DIAGNOSIS — Z933 Colostomy status: Secondary | ICD-10-CM

## 2019-04-07 DIAGNOSIS — R0989 Other specified symptoms and signs involving the circulatory and respiratory systems: Secondary | ICD-10-CM

## 2019-04-07 DIAGNOSIS — K631 Perforation of intestine (nontraumatic): Secondary | ICD-10-CM | POA: Diagnosis not present

## 2019-04-07 NOTE — Patient Instructions (Addendum)
If you are age 55 or older, your body mass index should be between 23-30. Your Body mass index is 22.12 kg/m. If this is out of the aforementioned range listed, please consider follow up with your Primary Care Provider.  If you are age 37 or younger, your body mass index should be between 19-25. Your Body mass index is 22.12 kg/m. If this is out of the aformentioned range listed, please consider follow up with your Primary Care Provider.   To help prevent the possible spread of infection to our patients, communities, and staff; we will be implementing the following measures:  As of now we are not allowing any visitors/family members to accompany you to any upcoming appointments with [facility]. If you have any concerns about this please contact our office to discuss prior to the appointment.   You have been scheduled for a colonoscopy. Please follow written instructions given to you at your visit today.  Please pick up your prep supplies at the pharmacy within the next 1-3 days. If you use inhalers (even only as needed), please bring them with you on the day of your procedure. Your physician has requested that you go to www.startemmi.com and enter the access code given to you at your visit today. This web site gives a general overview about your procedure. However, you should still follow specific instructions given to you by our office regarding your preparation for the procedure.   We are giving you a sample of SUPREP today to use for your prep for your colonoscopy on Monday, 8-17.  Thank you for entrusting me with your care and for choosing [facility], Dr. Witmer Cellar
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
## 2019-04-09 ENCOUNTER — Telehealth: Payer: Self-pay | Admitting: Gastroenterology

## 2019-04-09 NOTE — Telephone Encounter (Signed)
Pt responded "no" to all screening questions °

## 2019-04-09 NOTE — Telephone Encounter (Signed)
°

## 2019-04-10 ENCOUNTER — Other Ambulatory Visit: Payer: Self-pay | Admitting: Internal Medicine

## 2019-04-12 ENCOUNTER — Other Ambulatory Visit: Payer: Self-pay | Admitting: Internal Medicine

## 2019-04-12 ENCOUNTER — Encounter: Payer: Self-pay | Admitting: Gastroenterology

## 2019-04-12 ENCOUNTER — Ambulatory Visit (AMBULATORY_SURGERY_CENTER): Payer: PPO | Admitting: Gastroenterology

## 2019-04-12 ENCOUNTER — Other Ambulatory Visit: Payer: Self-pay | Admitting: *Deleted

## 2019-04-12 ENCOUNTER — Other Ambulatory Visit: Payer: Self-pay

## 2019-04-12 VITALS — BP 146/91 | HR 66 | Temp 97.8°F | Resp 16 | Ht 65.5 in | Wt 135.0 lb

## 2019-04-12 DIAGNOSIS — K5730 Diverticulosis of large intestine without perforation or abscess without bleeding: Secondary | ICD-10-CM | POA: Diagnosis not present

## 2019-04-12 DIAGNOSIS — Z933 Colostomy status: Secondary | ICD-10-CM | POA: Diagnosis not present

## 2019-04-12 DIAGNOSIS — Z1211 Encounter for screening for malignant neoplasm of colon: Secondary | ICD-10-CM | POA: Diagnosis not present

## 2019-04-12 DIAGNOSIS — K631 Perforation of intestine (nontraumatic): Secondary | ICD-10-CM

## 2019-04-12 DIAGNOSIS — D120 Benign neoplasm of cecum: Secondary | ICD-10-CM

## 2019-04-12 HISTORY — PX: COLONOSCOPY: SHX174

## 2019-04-12 MED ORDER — SODIUM CHLORIDE 0.9 % IV SOLN: 500.0000 mL | Freq: Once | INTRAVENOUS | Status: DC

## 2019-04-12 NOTE — Patient Instructions (Signed)
Handouts Provided:  Polyps  YOU HAD AN ENDOSCOPIC PROCEDURE TODAY AT [facility]:   Refer to the procedure report that was given to you for any specific questions about what was found during the examination.  If the procedure report does not answer your questions, please call your gastroenterologist to clarify.  If you requested that your care partner not be given the details of your procedure findings, then the procedure report has been included in a sealed envelope for you to review at your convenience later.  YOU SHOULD EXPECT: Some feelings of bloating in the abdomen. Passage of more gas than usual.  Walking can help get rid of the air that was put into your GI tract during the procedure and reduce the bloating. If you had a lower endoscopy (such as a colonoscopy or flexible sigmoidoscopy) you may notice spotting of blood in your stool or on the toilet paper. If you underwent a bowel prep for your procedure, you may not have a normal bowel movement for a few days.  Please Note:  You might notice some irritation and congestion in your nose or some drainage.  This is from the oxygen used during your procedure.  There is no need for concern and it should clear up in a day or so.  SYMPTOMS TO REPORT IMMEDIATELY:   Following lower endoscopy (colonoscopy or flexible sigmoidoscopy):  Excessive amounts of blood in the stool  Significant tenderness or worsening of abdominal pains  Swelling of the abdomen that is new, acute  Fever of 100F or higher  For urgent or emergent issues, a gastroenterologist can be reached at any hour by calling [phone].   DIET:  We do recommend a small meal at first, but then you may proceed to your regular diet.  Drink plenty of fluids but you should avoid alcoholic beverages for 24 hours.  ACTIVITY:  You should plan to take it easy for the rest of today and you should NOT DRIVE or use heavy machinery until tomorrow (because of the sedation  medicines used during the test).    FOLLOW UP: Our staff will call the number listed on your records 48-72 hours following your procedure to check on you and address any questions or concerns that you may have regarding the information given to you following your procedure. If we do not reach you, we will leave a message.  We will attempt to reach you two times.  During this call, we will ask if you have developed any symptoms of COVID 19. If you develop any symptoms (ie: fever, flu-like symptoms, shortness of breath, cough etc.) before then, please call [phone].  If you test positive for Covid 19 in the 2 weeks post procedure, please call and report this information to [place].    If any biopsies were taken you will be contacted by phone or by letter within the next 1-3 weeks.  Please call us at [phone] if you have not heard about the biopsies in 3 weeks.    SIGNATURES/CONFIDENTIALITY: You and/or your care partner have signed paperwork which will be entered into your electronic medical record.  These signatures attest to the fact that that the information above on your After Visit Summary has been reviewed and is understood.  Full responsibility of the confidentiality of this discharge information lies with you and/or your care-partner.

## 2019-04-12 NOTE — Telephone Encounter (Signed)
Refill Request for a 3 month supply    Pt is requesting a phone call back when it's ready if possible.   morphine (MSIR) 30 MG tablet  [id number] - [place], [place] - [address]

## 2019-04-12 NOTE — Telephone Encounter (Signed)
Refused Please see dr Ronnald Ramp 8/15 note

## 2019-04-12 NOTE — Progress Notes (Signed)
A/ox3, pleased with MAC, report to RN 

## 2019-04-12 NOTE — Op Note (Signed)
[place] Patient Name: Julie Williams Procedure Date: 04/12/2019 7:31 AM MRN: [id number] Endoscopist: Remo Lipps P. Havery Moros , MD Age: 53 Referring MD:  Date of Birth: 08/17/1966 Gender: Female Account #: [id number] Procedure:                Colonoscopy Indications:              Post surgical follow-up - history of spontaneous                            colonic perforation s/p sigmoid colostomy,                            colonoscopy to be done prior to takedown of                            colostomy Medicines:                Monitored Anesthesia Care Procedure:                Pre-Anesthesia Assessment:                           - Prior to the procedure, a History and Physical                            was performed, and patient medications and                            allergies were reviewed. The patient's tolerance of                            previous anesthesia was also reviewed. The risks                            and benefits of the procedure and the sedation                            options and risks were discussed with the patient.                            All questions were answered, and informed consent                            was obtained. Prior Anticoagulants: The patient has                            taken no previous anticoagulant or antiplatelet                            agents. ASA Grade Assessment: II - A patient with                            mild systemic disease. After reviewing the risks  and benefits, the patient was deemed in                            satisfactory condition to undergo the procedure.                           After obtaining informed consent, the colonoscope                            was passed under direct vision. Throughout the                            procedure, the patient's blood pressure, pulse, and                            oxygen saturations were monitored continuously. The                     Colonoscope was introduced through the sigmoid                            colostomy and advanced to the the cecum, identified                            by appendiceal orifice and ileocecal valve. The                            colonoscopy was performed without difficulty. The                            patient tolerated the procedure well. The quality                            of the bowel preparation was adequate. The                            ileocecal valve, appendiceal orifice, and rectum                            were photographed. Scope In: 7:38:11 AM Scope Out: 7:55:31 AM Scope Withdrawal Time: 0 hours 15 minutes 13 seconds  Total Procedure Duration: 0 hours 17 minutes 20 seconds  Findings:                 The perianal and digital rectal examinations were                            normal.                           There was evidence of a widely patent end colostomy                            in the sigmoid colon. This was characterized by  healthy appearing mucosa.                           A diminutive polyp was found in the cecum. The                            polyp was sessile. The polyp was removed with a                            cold biopsy forceps. Resection and retrieval were                            complete.                           Scattered small-mouthed diverticula were found in                            the entire colon.                           The exam was otherwise without abnormality.                           The rectal pouch was briefly examined - exam                            limited by mucous and poor visualization but mucosa                            appeared healthy, no obvious polyps. Complications:            No immediate complications. Estimated blood loss:                            Minimal. Estimated Blood Loss:     Estimated blood loss was minimal. Impression:               - Widely patent  end colostomy with healthy                            appearing mucosa in the sigmoid colon.                           - One diminutive polyp in the cecum, removed with a                            cold biopsy forceps. Resected and retrieved.                           - Diverticulosis in the entire examined colon.                           - The examination was otherwise normal.                           -  Residual mucous in rectal pouch, otherwise                            healthy appearing. Recommendation:           - Patient has a contact number available for                            emergencies. The signs and symptoms of potential                            delayed complications were discussed with the                            patient. Return to normal activities tomorrow.                            Written discharge instructions were provided to the                            patient.                           - Resume previous diet.                           - Continue present medications.                           - Await pathology results.                           - Proceed with takedown of colostomy with                            colorectal surgery Rever Pichette P. Sahalie Beth, MD 04/12/2019 8:04:33 AM This report has been signed electronically.

## 2019-04-12 NOTE — Telephone Encounter (Signed)
Called pt regarding requested refills. Pt requesting a 90 day supply of sertraline. Per chart review, a 90 day supply of venlafaxine was also dispensed last month. Confirmed with pt on the phone that she was not taking both medications. Pt stated the venlafaxine was automatically refilled, so she discarded it after picking it up. Explained to pt that it was not safe to take those two medications together. Pt endorses that she has only being taking the sertraline, one 25mg  tablet daily. Denies any medication side effects. Has not seen improvement in her mood. Explained that the medication takes time to work, and we can check-in at her next follow-up appointment in October to see if she needs a dose adjustment. Additionally, pt requesting refills to her morphine prescription. Her next prescription is due to be filled on 05/02/2019. Will put in for a 30-day supply closer to that date. Pt was agreeable to this plan.

## 2019-04-12 NOTE — Progress Notes (Signed)
Called to room to assist during endoscopic procedure.  Patient ID and intended procedure confirmed with present staff. Received instructions for my participation in the procedure from the performing physician.  

## 2019-04-13 ENCOUNTER — Ambulatory Visit: Payer: Self-pay | Admitting: General Surgery

## 2019-04-13 NOTE — H&P (Signed)
History of Present Illness  The patient is a 53 year old female who presents with colonic obstruction. 53 year old female who presents to the office for evaluation of colostomy reversal. She underwent a Hartmann's procedure in late December 2019 at [facility] for a stercoral-type perforation. On the operative report . The perforation was noted to be at the rectosigmoid junction. She had copious amounts of stool within her belly. She developed several postoperative abscesses. She required ventilation for aspiration pneumonia and was transferred to [facility]. She was discharged in February 2020.  She is back to her baseline. She takes chronic narcotics for chronic neck pain.   Past Surgical History Mammie Lorenzo, LPN; 12/09/6061 01:60 AM) Cesarean Section - 1  Colon Polyp Removal - Colonoscopy  Colon Polyp Removal - Open  Knee Surgery  Right. Spinal Surgery - Neck  Shoulder Surgery  Right.  Diagnostic Studies History Mammie Lorenzo, LPN; 09/03/3233 57:32 AM) Colonoscopy  1-5 years ago Mammogram  within last year Pap Smear  1-5 years ago  Allergies (Tanisha A. Owens Shark, [place]; 02/22/2019 9:00 AM) No Known Drug Allergies  [02/22/2019]: Allergies Reconciled   Medication History (Tanisha A. Owens Shark, RMA; 02/22/2019 9:01 AM) Morphine Sulfate (30MG  Tablet, Oral) Active. Citalopram Hydrobromide (20MG  Tablet, Oral) Active. Ondansetron HCl (4MG  Tablet, Oral) Active. Levothyroxine Sodium (25MCG Tablet, Oral) Active. Medications Reconciled  Social History Mammie Lorenzo, LPN; 09/27/5425 06:23 AM) Tobacco use  Former smoker. Alcohol use  Occasional alcohol use. Caffeine use  Coffee.  Family History Mammie Lorenzo, LPN; 7/62/8315 17:61 AM) Alcohol Abuse  Mother, Sister. Depression  Family Members In General, Mother, Sister. Colon Polyps  Family Members In General. Heart Disease  Father. Hypertension  Father.  Pregnancy / Birth History Mammie Lorenzo, LPN; 01/30/3709 62:69 AM) Age at menarche  59 years. Gravida  1 Length (months) of breastfeeding  3-6 Maternal age  35-25 Para  1  Other Problems Mammie Lorenzo, LPN; 4/85/4627 03:50 AM) Depression  Gastroesophageal Reflux Disease     Review of Systems  General Not Present- Appetite Loss, Chills, Fatigue, Fever, Night Sweats, Weight Gain and Weight Loss. Skin Not Present- Change in Wart/Mole, Dryness, Hives, Jaundice, New Lesions, Non-Healing Wounds, Rash and Ulcer. HEENT Present- Hearing Loss, Ringing in the Ears and Seasonal Allergies. Not Present- Earache, Hoarseness, Nose Bleed, Oral Ulcers, Sinus Pain, Sore Throat, Visual Disturbances, Wears glasses/contact lenses and Yellow Eyes. Respiratory Not Present- Bloody sputum, Chronic Cough, Difficulty Breathing, Snoring and Wheezing. Breast Not Present- Breast Mass, Breast Pain, Nipple Discharge and Skin Changes. Cardiovascular Not Present- Chest Pain, Difficulty Breathing Lying Down, Leg Cramps, Palpitations, Rapid Heart Rate, Shortness of Breath and Swelling of Extremities. Gastrointestinal Not Present- Abdominal Pain, Bloating, Bloody Stool, Change in Bowel Habits, Chronic diarrhea, Constipation, Difficulty Swallowing, Excessive gas, Gets full quickly at meals, Hemorrhoids, Indigestion, Nausea, Rectal Pain and Vomiting. Female Genitourinary Not Present- Frequency, Nocturia, Painful Urination, Pelvic Pain and Urgency. Musculoskeletal Not Present- Back Pain, Joint Pain, Joint Stiffness, Muscle Pain, Muscle Weakness and Swelling of Extremities. Neurological Not Present- Decreased Memory, Fainting, Headaches, Numbness, Seizures, Tingling, Tremor, Trouble walking and Weakness. Psychiatric Present- Depression. Not Present- Anxiety, Bipolar, Change in Sleep Pattern, Fearful and Frequent crying. Endocrine Present- Hair Changes. Not Present- Cold Intolerance, Excessive Hunger, Heat Intolerance, Hot flashes and New  Diabetes. Hematology Not Present- Blood Thinners, Easy Bruising, Excessive bleeding, Gland problems, HIV and Persistent Infections.     Physical Exam  General Mental Status-Alert. General Appearance-Not in acute distress. Build & Nutrition-Well nourished. Posture-Normal  posture. Gait-Normal.  Head and Neck Head-normocephalic, atraumatic with no lesions or palpable masses. Trachea-midline.  Chest and Lung Exam Chest and lung exam reveals -on auscultation, normal breath sounds, no adventitious sounds and normal vocal resonance.  Cardiovascular Cardiovascular examination reveals -normal heart sounds, regular rate and rhythm with no murmurs and no digital clubbing, cyanosis, edema, increased warmth or tenderness.  Abdomen Inspection Inspection of the abdomen reveals - No Hernias. Skin - Scar - Epigastrium (healing well) . Palpation/Percussion Palpation and Percussion of the abdomen reveal - Soft, Non Tender, No Rigidity (guarding), No hepatosplenomegaly and No Palpable abdominal masses.  Neurologic Neurologic evaluation reveals -alert and oriented x 3 with no impairment of recent or remote memory, normal attention span and ability to concentrate, normal sensation and normal coordination.  Musculoskeletal Normal Exam - Bilateral-Upper Extremity Strength Normal and Lower Extremity Strength Normal.    Assessment & Plan   STERCORAL ULCER OF RECTUM (K62.6) Impression: 53 year old female who underwent a Hartman's procedure in late December 2019 at [facility] for a stercoral perforation of the rectosigmoid junction. She had a prolonged hospitalization requiring ventilation and transfer to [facility]. She was discharged in February. She underwent a repeat colonoscopy which was normal.  She is now ready for her surgery. The surgery and anatomy were described to the patient as well as the risks of surgery and the possible complications.  These  include: Bleeding, deep abdominal infections and possible wound complications such as hernia and infection, damage to adjacent structures, leak of surgical connections, which can lead to other surgeries and possibly an ostomy, possible need for other procedures, such as abscess drains in radiology, possible prolonged hospital stay, possible diarrhea from removal of part of the colon, possible constipation from narcotics, prolonged fatigue/weakness or appetite loss, possible early recurrence of of disease, possible complications of their medical problems such as heart disease or arrhythmias or lung problems, death (less than 1%). I believe the patient understands and wishes to proceed with the surgery.

## 2019-04-14 ENCOUNTER — Telehealth: Payer: Self-pay

## 2019-04-14 NOTE — Telephone Encounter (Signed)
  Follow up Call-  Call back number 04/12/2019 01/03/2017 08/21/2016  Post procedure Call Back phone  # [phone]  Permission to leave phone message Yes Yes Yes  Some recent data might be hidden     Patient questions:  Do you have a fever, pain , or abdominal swelling? No. Pain Score  0 *  Have you tolerated food without any problems? Yes.    Have you been able to return to your normal activities? Yes.    Do you have any questions about your discharge instructions: Diet   No. Medications  No. Follow up visit  No.  Do you have questions or concerns about your Care? No.  Actions: * If pain score is 4 or above: No action needed, pain <4.  1. Have you developed a fever since your procedure? no  2.   Have you had an respiratory symptoms (SOB or cough) since your procedure? no  3.   Have you tested positive for COVID 19 since your procedure no 4.   Have you had any family members/close contacts diagnosed with the COVID 19 since your procedure?  no   If yes to any of these questions please route to Joylene John, RN and Alphonsa Gin, Therapist, sports.

## 2019-04-16 ENCOUNTER — Encounter: Payer: Self-pay | Admitting: Gastroenterology

## 2019-04-28 ENCOUNTER — Other Ambulatory Visit: Payer: Self-pay

## 2019-04-28 MED ORDER — MORPHINE SULFATE 30 MG PO TABS: 30.0000 mg | ORAL_TABLET | Freq: Four times a day (QID) | ORAL | 0 refills | Status: DC | PRN

## 2019-04-28 NOTE — Telephone Encounter (Signed)
morphine (MSIR) 30 MG tablet, refill request @  [facility] #[id number] [address], [place]. [phone] (Phone) [phone] (Fax)   Pt states she will run out of meds by Sunday, requesting to speak with a nurse. Please call back.

## 2019-04-28 NOTE — Telephone Encounter (Signed)
Last office visit: 03/18/19 Last UDS: 09/04/2017 Last Refill: 04/01/19 #120 (confirmed with pharmacy) Next appt: 05/27/19 w/pcp  will send request to pcp-Please advise.Despina Hidden Cassady9/2/202012:16 PM

## 2019-04-28 NOTE — Telephone Encounter (Signed)
Reviewed Database and discussed with PCP however her E-controlled substance Rx is not working.  Approved refill.

## 2019-04-28 NOTE — Telephone Encounter (Signed)
Pt contacted and made aware rx will be ready for pick up at pharmacy on 09/05.  Pt wanted to let pcp know that she has been scheduled for a colostomy reversal on Oct 7th. Pt also wanted to address getting three (3) one month supply prescriptions for her MSIR sent to her pharmacy and she was instructed to address it with her pcp at her 10/01. No further action needed, phone call complete.Despina Hidden Cassady9/2/20202:15 PM

## 2019-05-26 ENCOUNTER — Encounter (HOSPITAL_COMMUNITY): Payer: Self-pay

## 2019-05-26 NOTE — Patient Instructions (Addendum)
DUE TO COVID-19 ONLY ONE VISITOR IS ALLOWED TO COME WITH YOU AND STAY IN THE WAITING ROOM ONLY DURING PRE OP AND PROCEDURE. THE ONE VISITOR MAY VISIT WITH YOU IN YOUR PRIVATE ROOM DURING VISITING HOURS ONLY!!   COVID SWAB TESTING MUST BE COMPLETED ON: Saturday, Oct. 5, 2020 at [place], [place] [facility] enter pre surgical testing line (Must self quarantine after testing. Follow instructions on handout.)             Your procedure is scheduled on: Wednesday, Oct. 7, 2020   Report to [facility] Main  Entrance    Report to admitting at 6:30 AM   Call this number if you have problems the morning of surgery [phone]   Increase fluid in take the day of your prep before surgery to prevent dehydration.   Drink 2 Ensure drinks the night before surgery.     Do not eat food:After Midnight.   May have clear liquids after midnight.     CLEAR LIQUID DIET  Foods Allowed                                                                     Foods Excluded  Water, Black Coffee and tea, regular and decaf                             liquids that you cannot  Plain Jell-O in any flavor  (No red)                                           see through such as: Fruit ices (not with fruit pulp)                                     milk, soups, orange juice  Iced Popsicles (No red)                                    All solid food Carbonated beverages, regular and diet                                    Apple juices Sports drinks like Gatorade (No red) Lightly seasoned clear broth or consume(fat free) Sugar, honey syrup  Sample Menu Breakfast                                Lunch                                     Supper Cranberry juice                    Beef broth  Chicken broth Jell-O                                     Grape juice                           Apple juice Coffee or tea                        Jell-O                                       Popsicle                                                Coffee or tea                        Coffee or tea   Complete one Ensure drink the morning of surgery at 5:30 AM day of surgery   Brush your teeth the morning of surgery.   Do NOT smoke after Midnight   Take these medicines the morning of surgery with A SIP OF WATER: Levothyroxine, Omeprazole, Sertraline, Morphine if needed                               You may not have any metal on your body including hair pins, jewelry, and body piercings             Do not wear make-up, lotions, powders, perfumes/cologne, or deodorant             Do not wear nail polish.  Do not shave  48 hours prior to surgery.                Do not bring valuables to the hospital. [place].   Contacts, dentures or bridgework may not be worn into surgery.   Bring small overnight bag day of surgery.    Special Instructions: Bring a copy of your healthcare power of attorney and living will documents the day of surgery if you haven't scanned them in before.              Please read over the following fact sheets you were given:  [facility] - Preparing for Surgery Before surgery, you can play an important role.  Because skin is not sterile, your skin needs to be as free of germs as possible.  You can reduce the number of germs on your skin by washing with CHG (chlorahexidine gluconate) soap before surgery.  CHG is an antiseptic cleaner which kills germs and bonds with the skin to continue killing germs even after washing. Please DO NOT use if you have an allergy to CHG or antibacterial soaps.  If your skin becomes reddened/irritated stop using the CHG and inform your nurse when you arrive at Short Stay. Do not shave (including legs and underarms) for at least 48 hours prior to the first CHG shower.  You  may shave your face/neck.  Please follow these instructions carefully:  1.  Shower  with CHG Soap the night before surgery and the  morning of surgery.  2.  If you choose to wash your hair, wash your hair first as usual with your normal  shampoo.  3.  After you shampoo, rinse your hair and body thoroughly to remove the shampoo.                             4.  Use CHG as you would any other liquid soap.  You can apply chg directly to the skin and wash.  Gently with a scrungie or clean washcloth.  5.  Apply the CHG Soap to your body ONLY FROM THE NECK DOWN.   Do not use on face/ open                           Wound or open sores. Avoid contact with eyes, ears mouth and genitals (private parts).                       Wash face,  Genitals (private parts) with your normal soap.             6.  Wash thoroughly, paying special attention to the area where your surgery  will be performed.  7.  Thoroughly rinse your body with warm water from the neck down.  8.  DO NOT shower/wash with your normal soap after using and rinsing off the CHG Soap.                9.  Pat yourself dry with a clean towel.            10.  Wear clean pajamas.            11.  Place clean sheets on your bed the night of your first shower and do not  sleep with pets. Day of Surgery : Do not apply any lotions/deodorants the morning of surgery.  Please wear clean clothes to the hospital/surgery center.  FAILURE TO FOLLOW THESE INSTRUCTIONS MAY RESULT IN THE CANCELLATION OF YOUR SURGERY  PATIENT SIGNATURE_________________________________  NURSE SIGNATURE__________________________________  ________________________________________________________________________   Adam Phenix  An incentive spirometer is a tool that can help keep your lungs clear and active. This tool measures how well you are filling your lungs with each breath. Taking long deep breaths may help reverse or decrease the chance of developing breathing (pulmonary) problems (especially infection) following:  A long period of time when you are  unable to move or be active. BEFORE THE PROCEDURE   If the spirometer includes an indicator to show your best effort, your nurse or respiratory therapist will set it to a desired goal.  If possible, sit up straight or lean slightly forward. Try not to slouch.  Hold the incentive spirometer in an upright position. INSTRUCTIONS FOR USE  1. Sit on the edge of your bed if possible, or sit up as far as you can in bed or on a chair. 2. Hold the incentive spirometer in an upright position. 3. Breathe out normally. 4. Place the mouthpiece in your mouth and seal your lips tightly around it. 5. Breathe in slowly and as deeply as possible, raising the piston or the ball toward the top of the column. 6. Hold your breath for 3-5 seconds or for as  long as possible. Allow the piston or ball to fall to the bottom of the column. 7. Remove the mouthpiece from your mouth and breathe out normally. 8. Rest for a few seconds and repeat Steps 1 through 7 at least 10 times every 1-2 hours when you are awake. Take your time and take a few normal breaths between deep breaths. 9. The spirometer may include an indicator to show your best effort. Use the indicator as a goal to work toward during each repetition. 10. After each set of 10 deep breaths, practice coughing to be sure your lungs are clear. If you have an incision (the cut made at the time of surgery), support your incision when coughing by placing a pillow or rolled up towels firmly against it. Once you are able to get out of bed, walk around indoors and cough well. You may stop using the incentive spirometer when instructed by your caregiver.  RISKS AND COMPLICATIONS  Take your time so you do not get dizzy or light-headed.  If you are in pain, you may need to take or ask for pain medication before doing incentive spirometry. It is harder to take a deep breath if you are having pain. AFTER USE  Rest and breathe slowly and easily.  It can be helpful to  keep track of a log of your progress. Your caregiver can provide you with a simple table to help with this. If you are using the spirometer at home, follow these instructions: [place] IF:   You are having difficultly using the spirometer.  You have trouble using the spirometer as often as instructed.  Your pain medication is not giving enough relief while using the spirometer.  You develop fever of 100.5 F (38.1 C) or higher. SEEK IMMEDIATE MEDICAL CARE IF:   You cough up bloody sputum that had not been present before.  You develop fever of 102 F (38.9 C) or greater.  You develop worsening pain at or near the incision site. MAKE SURE YOU:   Understand these instructions.  Will watch your condition.  Will get help right away if you are not doing well or get worse. Document Released: 12/23/2006 Document Revised: 11/04/2011 Document Reviewed: 02/23/2007 [facility] Patient Information 2014 [place], [place].   ________________________________________________________________________

## 2019-05-27 ENCOUNTER — Ambulatory Visit (INDEPENDENT_AMBULATORY_CARE_PROVIDER_SITE_OTHER): Payer: PPO | Admitting: Internal Medicine

## 2019-05-27 ENCOUNTER — Encounter (HOSPITAL_COMMUNITY)
Admission: RE | Admit: 2019-05-27 | Discharge: 2019-05-27 | Disposition: A | Discharge status: Home / Self Care | Payer: PPO | Source: Ambulatory Visit | Attending: General Surgery | Admitting: General Surgery

## 2019-05-27 ENCOUNTER — Other Ambulatory Visit: Payer: Self-pay

## 2019-05-27 ENCOUNTER — Encounter (HOSPITAL_COMMUNITY): Payer: Self-pay

## 2019-05-27 ENCOUNTER — Encounter: Payer: Self-pay | Admitting: Internal Medicine

## 2019-05-27 VITALS — BP 134/97 | HR 92 | Temp 99.1°F | Ht 65.0 in | Wt 137.9 lb

## 2019-05-27 DIAGNOSIS — F419 Anxiety disorder, unspecified: Secondary | ICD-10-CM

## 2019-05-27 DIAGNOSIS — F339 Major depressive disorder, recurrent, unspecified: Secondary | ICD-10-CM | POA: Diagnosis not present

## 2019-05-27 DIAGNOSIS — I10 Essential (primary) hypertension: Secondary | ICD-10-CM | POA: Insufficient documentation

## 2019-05-27 DIAGNOSIS — K219 Gastro-esophageal reflux disease without esophagitis: Secondary | ICD-10-CM | POA: Diagnosis not present

## 2019-05-27 DIAGNOSIS — Z87891 Personal history of nicotine dependence: Secondary | ICD-10-CM | POA: Insufficient documentation

## 2019-05-27 DIAGNOSIS — Z86010 Personal history of colonic polyps: Secondary | ICD-10-CM | POA: Diagnosis not present

## 2019-05-27 DIAGNOSIS — Z933 Colostomy status: Secondary | ICD-10-CM | POA: Diagnosis not present

## 2019-05-27 DIAGNOSIS — Z79899 Other long term (current) drug therapy: Secondary | ICD-10-CM | POA: Diagnosis not present

## 2019-05-27 DIAGNOSIS — Z01818 Encounter for other preprocedural examination: Secondary | ICD-10-CM | POA: Diagnosis not present

## 2019-05-27 DIAGNOSIS — F330 Major depressive disorder, recurrent, mild: Secondary | ICD-10-CM

## 2019-05-27 DIAGNOSIS — R030 Elevated blood-pressure reading, without diagnosis of hypertension: Secondary | ICD-10-CM

## 2019-05-27 LAB — COMPREHENSIVE METABOLIC PANEL
ALT: 31 U/L (ref 0–44)
AST: 32 U/L (ref 15–41)
Albumin: 4.4 g/dL (ref 3.5–5.0)
Alkaline Phosphatase: 102 U/L (ref 38–126)
Anion gap: 8 (ref 5–15)
BUN: 21 mg/dL — ABNORMAL HIGH (ref 6–20)
CO2: 25 mmol/L (ref 22–32)
Calcium: 9.2 mg/dL (ref 8.9–10.3)
Chloride: 103 mmol/L (ref 98–111)
Creatinine, Ser: 0.74 mg/dL (ref 0.44–1.00)
GFR calc Af Amer: >60 mL/min (ref >60)
GFR calc non Af Amer: >60 mL/min (ref >60)
Glucose, Bld: 102 mg/dL — ABNORMAL HIGH (ref 70–99)
Potassium: 5 mmol/L (ref 3.5–5.1)
Sodium: 136 mmol/L (ref 135–145)
Total Bilirubin: 0.8 mg/dL (ref 0.3–1.2)
Total Protein: 7.8 g/dL (ref 6.5–8.1)

## 2019-05-27 LAB — CBC
HCT: 46.1 % — ABNORMAL HIGH (ref 36.0–46.0)
Hemoglobin: 14.8 g/dL (ref 12.0–15.0)
MCH: 33.5 pg (ref 26.0–34.0)
MCHC: 32.1 g/dL (ref 30.0–36.0)
MCV: 104.3 fL — ABNORMAL HIGH (ref 80.0–100.0)
Platelets: 198 10*3/uL (ref 150–400)
RBC: 4.42 MIL/uL (ref 3.87–5.11)
RDW: 11.4 % — ABNORMAL LOW (ref 11.5–15.5)
WBC: 7.3 10*3/uL (ref 4.0–10.5)
nRBC: 0 % (ref 0.0–0.2)

## 2019-05-27 LAB — SURGICAL PCR SCREEN
MRSA, PCR: POSITIVE — AB
Staphylococcus aureus: POSITIVE — AB

## 2019-05-27 MED ORDER — VORTIOXETINE HBR 5 MG PO TABS: 5.0000 mg | ORAL_TABLET | Freq: Every day | ORAL | 1 refills | Status: DC

## 2019-05-27 MED ORDER — LISINOPRIL 5 MG PO TABS: 5.0000 mg | ORAL_TABLET | Freq: Every day | ORAL | 11 refills | Status: DC
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
## 2019-05-29 ENCOUNTER — Encounter: Payer: Self-pay | Admitting: Internal Medicine

## 2019-05-29 ENCOUNTER — Other Ambulatory Visit (HOSPITAL_COMMUNITY)
Admission: RE | Admit: 2019-05-29 | Discharge: 2019-05-29 | Disposition: A | Discharge status: Home / Self Care | Payer: PPO | Source: Ambulatory Visit | Attending: General Surgery | Admitting: General Surgery

## 2019-05-29 ENCOUNTER — Telehealth: Payer: Self-pay | Admitting: Internal Medicine

## 2019-05-29 DIAGNOSIS — Z01812 Encounter for preprocedural laboratory examination: Secondary | ICD-10-CM | POA: Insufficient documentation

## 2019-05-29 DIAGNOSIS — Z20828 Contact with and (suspected) exposure to other viral communicable diseases: Secondary | ICD-10-CM | POA: Diagnosis not present

## 2019-05-29 NOTE — Telephone Encounter (Signed)
I called Julie Williams after got paged on after hours pager. She asks for refill on Morphine. She has few tablets left for weekend. I asked her to call [facility] on Monday for refill request if appropriates.

## 2019-05-31 ENCOUNTER — Other Ambulatory Visit: Payer: Self-pay

## 2019-05-31 LAB — NOVEL CORONAVIRUS, NAA (HOSP ORDER, SEND-OUT TO REF LAB; TAT 18-24 HRS): SARS-CoV-2, NAA: NOT DETECTED

## 2019-05-31 MED ORDER — MORPHINE SULFATE 30 MG PO TABS: 30.0000 mg | ORAL_TABLET | Freq: Four times a day (QID) | ORAL | 0 refills | Status: DC | PRN

## 2019-05-31 NOTE — Addendum Note (Signed)
Addended by: Ladona Horns on: 05/31/2019 08:22 AM   Modules accepted: Orders

## 2019-05-31 NOTE — Assessment & Plan Note (Signed)
Pt endorses continued depressed mood that is unchanged after starting sertraline 25mg  daily. She states she was on sertraline before and it didn't help her depression, therefore she is uninterested in increasing the sertraline to a therapeutic dose. Today she would like a "newer anti-depressant medication" or something she hasn't tried before. Per chart review, pt has been on venlafaxine, citalopram, amitriptyline, bupropion, and fluoxetine over the past 6 years. She declines psychiatry referral at this time.  PHQ-9 is 10 today, which is slightly improved from prior assessment of 14 on 7/23.  Assessment - major depressive disorder  Plan - prescribed 5mg  trintellex daily - instructed pt to go up to 10mg  daily after 2 weeks if she is tolerating the medication well and without side effects - follow-up in 1 month

## 2019-05-31 NOTE — Telephone Encounter (Signed)
Called pharm, the med is ready for pick up, last filled 9/5

## 2019-05-31 NOTE — Telephone Encounter (Signed)
Requesting to speak with a nurse about   morphine (MSIR) 30 MG tablet.   Pt states the pharmacy cannot filled the med not until 06/04/2019, pt states it's due today. Requesting the med by today. Please call pt back.

## 2019-05-31 NOTE — Telephone Encounter (Signed)
Sent in pt's refill for 30 day supply of Morphine. PDMP reviewed. Prescription to be filled 30 days after previous one.

## 2019-05-31 NOTE — Assessment & Plan Note (Signed)
Pt with borderline elevated blood pressure readings today. She is not currently on any hypertensive medications and does not take her blood pressure at home. She is concerned about her blood pressure readings and is interested in starting a medication. Recent CMP from 10/1, shows normal kidney function with Cr 0.74.  Assessment - Essential hypertension  Plan - begin lisinopril 5mg  daily - follow-up for BP check in 1 month  BP Readings from Last 3 Encounters:  05/27/19 (!) 137/92  05/27/19 (!) 134/97  04/12/19 (!) 146/91

## 2019-06-01 MED ORDER — BUPIVACAINE LIPOSOME 1.3 % IJ SUSP
20.0000 mL | Freq: Once | INTRAMUSCULAR | Status: DC
  Filled 2019-06-01: qty 20

## 2019-06-01 NOTE — Progress Notes (Signed)
Internal Medicine Clinic Attending  I saw and evaluated the patient.  I personally confirmed the key portions of the history and exam documented by Dr. Stanke and I reviewed pertinent patient test results.  The assessment, diagnosis, and plan were formulated together and I agree with the documentation in the resident's note.     

## 2019-06-01 NOTE — Anesthesia Preprocedure Evaluation (Addendum)
Anesthesia Evaluation  Patient identified by MRN, date of birth, ID band Patient awake    Reviewed: Allergy & Precautions, NPO status , Patient's Chart, lab work & pertinent test results  History of Anesthesia Complications Negative for: history of anesthetic complications  Airway Mallampati: II  TM Distance: >3 FB     Dental no notable dental hx. (+) Dental Advisory Given   Pulmonary former smoker,    Pulmonary exam normal        Cardiovascular hypertension, Pt. on medications Normal cardiovascular exam  CV: Stress Echo 02/16/2018 Study Conclusions  - Stress ECG conclusions: The stress ECG was normal. - Staged echo: Normal echo stress   Neuro/Psych PSYCHIATRIC DISORDERS Depression negative neurological ROS     GI/Hepatic Neg liver ROS, PUD, GERD  ,  Endo/Other  negative endocrine ROS  Renal/GU negative Renal ROS     Musculoskeletal negative musculoskeletal ROS (+)   Abdominal   Peds  Hematology negative hematology ROS (+)   Anesthesia Other Findings Day of surgery medications reviewed with the patient.  Reproductive/Obstetrics                            Anesthesia Physical Anesthesia Plan  ASA: III  Anesthesia Plan: General   Post-op Pain Management:    Induction: Intravenous  PONV Risk Score and Plan: 3 and Ondansetron, Dexamethasone and Treatment may vary due to age or medical condition  Airway Management Planned: Oral ETT  Additional Equipment:   Intra-op Plan:   Post-operative Plan: Extubation in OR  Informed Consent: I have reviewed the patients History and Physical, chart, labs and discussed the procedure including the risks, benefits and alternatives for the proposed anesthesia with the patient or authorized representative who has indicated his/her understanding and acceptance.     Dental advisory given  Plan Discussed with: Anesthesiologist and  CRNA  Anesthesia Plan Comments:       Anesthesia Quick Evaluation

## 2019-06-02 ENCOUNTER — Inpatient Hospital Stay (HOSPITAL_COMMUNITY): Payer: PPO | Admitting: Physician Assistant

## 2019-06-02 ENCOUNTER — Inpatient Hospital Stay (HOSPITAL_COMMUNITY)
Admission: RE | Admit: 2019-06-02 | Discharge: 2019-06-11 | DRG: 329 | Disposition: A | Discharge status: Home / Self Care | Payer: PPO | Attending: General Surgery | Admitting: General Surgery

## 2019-06-02 ENCOUNTER — Inpatient Hospital Stay (HOSPITAL_COMMUNITY): Payer: PPO | Admitting: Anesthesiology

## 2019-06-02 ENCOUNTER — Encounter (HOSPITAL_COMMUNITY): Payer: Self-pay

## 2019-06-02 ENCOUNTER — Encounter (HOSPITAL_COMMUNITY)
Admission: RE | Disposition: A | Discharge status: Home / Self Care | Payer: Self-pay | Source: Home / Self Care | Attending: General Surgery

## 2019-06-02 DIAGNOSIS — R188 Other ascites: Secondary | ICD-10-CM | POA: Diagnosis not present

## 2019-06-02 DIAGNOSIS — I10 Essential (primary) hypertension: Secondary | ICD-10-CM | POA: Diagnosis not present

## 2019-06-02 DIAGNOSIS — K5900 Constipation, unspecified: Secondary | ICD-10-CM | POA: Diagnosis present

## 2019-06-02 DIAGNOSIS — K660 Peritoneal adhesions (postprocedural) (postinfection): Secondary | ICD-10-CM | POA: Diagnosis not present

## 2019-06-02 DIAGNOSIS — R1903 Right lower quadrant abdominal swelling, mass and lump: Secondary | ICD-10-CM | POA: Diagnosis present

## 2019-06-02 DIAGNOSIS — K651 Peritoneal abscess: Secondary | ICD-10-CM

## 2019-06-02 DIAGNOSIS — G894 Chronic pain syndrome: Secondary | ICD-10-CM | POA: Diagnosis not present

## 2019-06-02 DIAGNOSIS — M542 Cervicalgia: Secondary | ICD-10-CM | POA: Diagnosis not present

## 2019-06-02 DIAGNOSIS — Z8719 Personal history of other diseases of the digestive system: Secondary | ICD-10-CM

## 2019-06-02 DIAGNOSIS — F32A Depression, unspecified: Secondary | ICD-10-CM | POA: Diagnosis present

## 2019-06-02 DIAGNOSIS — K630 Abscess of intestine: Secondary | ICD-10-CM | POA: Diagnosis not present

## 2019-06-02 DIAGNOSIS — K5790 Diverticulosis of intestine, part unspecified, without perforation or abscess without bleeding: Secondary | ICD-10-CM | POA: Diagnosis present

## 2019-06-02 DIAGNOSIS — N179 Acute kidney failure, unspecified: Secondary | ICD-10-CM | POA: Diagnosis not present

## 2019-06-02 DIAGNOSIS — Z8249 Family history of ischemic heart disease and other diseases of the circulatory system: Secondary | ICD-10-CM

## 2019-06-02 DIAGNOSIS — Z860100 Personal history of colon polyps, unspecified: Secondary | ICD-10-CM

## 2019-06-02 DIAGNOSIS — K432 Incisional hernia without obstruction or gangrene: Secondary | ICD-10-CM

## 2019-06-02 DIAGNOSIS — E039 Hypothyroidism, unspecified: Secondary | ICD-10-CM | POA: Diagnosis not present

## 2019-06-02 DIAGNOSIS — Z87891 Personal history of nicotine dependence: Secondary | ICD-10-CM

## 2019-06-02 DIAGNOSIS — E860 Dehydration: Secondary | ICD-10-CM | POA: Diagnosis present

## 2019-06-02 DIAGNOSIS — Z8371 Family history of colonic polyps: Secondary | ICD-10-CM | POA: Diagnosis not present

## 2019-06-02 DIAGNOSIS — K219 Gastro-esophageal reflux disease without esophagitis: Secondary | ICD-10-CM | POA: Diagnosis not present

## 2019-06-02 DIAGNOSIS — G8929 Other chronic pain: Secondary | ICD-10-CM | POA: Diagnosis present

## 2019-06-02 DIAGNOSIS — F329 Major depressive disorder, single episode, unspecified: Secondary | ICD-10-CM | POA: Diagnosis not present

## 2019-06-02 DIAGNOSIS — Z79891 Long term (current) use of opiate analgesic: Secondary | ICD-10-CM

## 2019-06-02 DIAGNOSIS — K631 Perforation of intestine (nontraumatic): Secondary | ICD-10-CM | POA: Diagnosis not present

## 2019-06-02 DIAGNOSIS — D62 Acute posthemorrhagic anemia: Secondary | ICD-10-CM | POA: Diagnosis not present

## 2019-06-02 DIAGNOSIS — K430 Incisional hernia with obstruction, without gangrene: Secondary | ICD-10-CM | POA: Diagnosis not present

## 2019-06-02 DIAGNOSIS — Z933 Colostomy status: Secondary | ICD-10-CM

## 2019-06-02 DIAGNOSIS — R110 Nausea: Secondary | ICD-10-CM

## 2019-06-02 DIAGNOSIS — K658 Other peritonitis: Secondary | ICD-10-CM

## 2019-06-02 DIAGNOSIS — K650 Generalized (acute) peritonitis: Secondary | ICD-10-CM | POA: Diagnosis not present

## 2019-06-02 DIAGNOSIS — Z433 Encounter for attention to colostomy: Principal | ICD-10-CM

## 2019-06-02 HISTORY — PX: COLOSTOMY REVERSAL: SHX5782

## 2019-06-02 LAB — PREPARE RBC (CROSSMATCH)

## 2019-06-02 LAB — ABO/RH: ABO/RH(D): B POS

## 2019-06-02 SURGERY — COLOSTOMY REVERSAL
Anesthesia: General | Site: Abdomen

## 2019-06-02 MED ORDER — PHENYLEPHRINE 40 MCG/ML (10ML) SYRINGE FOR IV PUSH (FOR BLOOD PRESSURE SUPPORT)
PREFILLED_SYRINGE | INTRAVENOUS | Status: DC | PRN
  Administered 2019-06-02 (×3): 80 ug via INTRAVENOUS

## 2019-06-02 MED ORDER — HYDROMORPHONE HCL 1 MG/ML IJ SOLN
INTRAMUSCULAR | Status: AC
  Filled 2019-06-02: qty 2

## 2019-06-02 MED ORDER — PROPOFOL 10 MG/ML IV BOLUS
INTRAVENOUS | Status: AC
  Filled 2019-06-02: qty 20

## 2019-06-02 MED ORDER — ROCURONIUM BROMIDE 10 MG/ML (PF) SYRINGE
PREFILLED_SYRINGE | INTRAVENOUS | Status: AC
  Filled 2019-06-02: qty 10

## 2019-06-02 MED ORDER — LACTATED RINGERS IV SOLN
INTRAVENOUS | Status: DC | PRN
  Administered 2019-06-02: 10:00:00 via INTRAVENOUS

## 2019-06-02 MED ORDER — KETAMINE HCL 10 MG/ML IJ SOLN
INTRAMUSCULAR | Status: AC
  Filled 2019-06-02: qty 1

## 2019-06-02 MED ORDER — DIAZEPAM 5 MG/ML IJ SOLN: 0.5000 mg | Freq: Four times a day (QID) | INTRAMUSCULAR | Status: DC | PRN

## 2019-06-02 MED ORDER — HYDROMORPHONE HCL 1 MG/ML IJ SOLN
INTRAMUSCULAR | Status: AC
  Filled 2019-06-02: qty 1

## 2019-06-02 MED ORDER — DEXMEDETOMIDINE HCL IN NACL 200 MCG/50ML IV SOLN
INTRAVENOUS | Status: DC | PRN
  Administered 2019-06-02: 8 ug via INTRAVENOUS
  Administered 2019-06-02: 24 ug via INTRAVENOUS
  Administered 2019-06-02: 16 ug via INTRAVENOUS

## 2019-06-02 MED ORDER — LEVOTHYROXINE SODIUM 25 MCG PO TABS
25.0000 ug | ORAL_TABLET | Freq: Every day | ORAL | Status: DC
  Administered 2019-06-03 – 2019-06-11 (×9): 25 ug via ORAL
  Filled 2019-06-02 (×9): qty 1

## 2019-06-02 MED ORDER — ONDANSETRON HCL 4 MG/2ML IJ SOLN
INTRAMUSCULAR | Status: DC | PRN
  Administered 2019-06-02: 4 mg via INTRAVENOUS

## 2019-06-02 MED ORDER — DEXAMETHASONE SODIUM PHOSPHATE 10 MG/ML IJ SOLN
INTRAMUSCULAR | Status: AC
  Filled 2019-06-02: qty 1

## 2019-06-02 MED ORDER — PHENYLEPHRINE HCL (PRESSORS) 10 MG/ML IV SOLN
INTRAVENOUS | Status: AC
  Filled 2019-06-02: qty 2

## 2019-06-02 MED ORDER — ONDANSETRON HCL 4 MG PO TABS: 4.0000 mg | ORAL_TABLET | Freq: Four times a day (QID) | ORAL | Status: DC | PRN

## 2019-06-02 MED ORDER — ALVIMOPAN 12 MG PO CAPS
12.0000 mg | ORAL_CAPSULE | ORAL | Status: AC
  Administered 2019-06-02: 12 mg via ORAL
  Filled 2019-06-02: qty 1

## 2019-06-02 MED ORDER — SODIUM CHLORIDE 0.9 % IV SOLN
2.0000 g | INTRAVENOUS | Status: AC
  Administered 2019-06-02: 09:00:00 2 g via INTRAVENOUS
  Filled 2019-06-02: qty 2

## 2019-06-02 MED ORDER — KETAMINE HCL 10 MG/ML IJ SOLN
INTRAMUSCULAR | Status: DC | PRN
  Administered 2019-06-02: 20 mg via INTRAVENOUS
  Administered 2019-06-02: 10 mg via INTRAVENOUS
  Administered 2019-06-02 (×2): 15 mg via INTRAVENOUS

## 2019-06-02 MED ORDER — SODIUM CHLORIDE 0.9 % IV SOLN
INTRAVENOUS | Status: DC | PRN
  Administered 2019-06-02: 25 ug/min via INTRAVENOUS

## 2019-06-02 MED ORDER — LACTATED RINGERS IV SOLN
INTRAVENOUS | Status: DC
  Administered 2019-06-02: 07:00:00 via INTRAVENOUS

## 2019-06-02 MED ORDER — ALBUMIN HUMAN 5 % IV SOLN
INTRAVENOUS | Status: DC | PRN
  Administered 2019-06-02: 10:00:00 via INTRAVENOUS

## 2019-06-02 MED ORDER — HYDROMORPHONE HCL 1 MG/ML IJ SOLN
0.2500 mg | INTRAMUSCULAR | Status: DC | PRN
  Administered 2019-06-02 (×4): 0.5 mg via INTRAVENOUS

## 2019-06-02 MED ORDER — GABAPENTIN 300 MG PO CAPS
300.0000 mg | ORAL_CAPSULE | Freq: Two times a day (BID) | ORAL | Status: DC
  Administered 2019-06-02 – 2019-06-11 (×18): 300 mg via ORAL
  Filled 2019-06-02 (×18): qty 1

## 2019-06-02 MED ORDER — FENTANYL CITRATE (PF) 100 MCG/2ML IJ SOLN
INTRAMUSCULAR | Status: AC
  Filled 2019-06-02: qty 2

## 2019-06-02 MED ORDER — NALOXONE HCL 0.4 MG/ML IJ SOLN: 0.4000 mg | INTRAMUSCULAR | Status: DC | PRN

## 2019-06-02 MED ORDER — ALBUMIN HUMAN 5 % IV SOLN
12.5000 g | Freq: Once | INTRAVENOUS | Status: AC
  Administered 2019-06-02: 12.5 g via INTRAVENOUS

## 2019-06-02 MED ORDER — ALBUMIN HUMAN 5 % IV SOLN
INTRAVENOUS | Status: AC
  Filled 2019-06-02: qty 250

## 2019-06-02 MED ORDER — EPHEDRINE SULFATE-NACL 50-0.9 MG/10ML-% IV SOSY
PREFILLED_SYRINGE | INTRAVENOUS | Status: DC | PRN
  Administered 2019-06-02: 5 mg via INTRAVENOUS
  Administered 2019-06-02: 10 mg via INTRAVENOUS
  Administered 2019-06-02: 5 mg via INTRAVENOUS

## 2019-06-02 MED ORDER — 0.9 % SODIUM CHLORIDE (POUR BTL) OPTIME
TOPICAL | Status: DC | PRN
  Administered 2019-06-02: 9000 mL

## 2019-06-02 MED ORDER — FENTANYL CITRATE (PF) 250 MCG/5ML IJ SOLN
INTRAMUSCULAR | Status: AC
  Filled 2019-06-02: qty 5

## 2019-06-02 MED ORDER — OXYCODONE HCL 5 MG PO TABS: 5.0000 mg | ORAL_TABLET | Freq: Once | ORAL | Status: DC | PRN

## 2019-06-02 MED ORDER — SODIUM CHLORIDE 0.9% FLUSH: 9.0000 mL | INTRAVENOUS | Status: DC | PRN

## 2019-06-02 MED ORDER — ONDANSETRON HCL 4 MG/2ML IJ SOLN
INTRAMUSCULAR | Status: AC
  Filled 2019-06-02: qty 2

## 2019-06-02 MED ORDER — SODIUM CHLORIDE 0.9 % IV SOLN
2.0000 g | Freq: Two times a day (BID) | INTRAVENOUS | Status: AC
  Administered 2019-06-02: 2 g via INTRAVENOUS
  Filled 2019-06-02: qty 2

## 2019-06-02 MED ORDER — ROCURONIUM BROMIDE 10 MG/ML (PF) SYRINGE
PREFILLED_SYRINGE | INTRAVENOUS | Status: DC | PRN
  Administered 2019-06-02: 30 mg via INTRAVENOUS
  Administered 2019-06-02: 70 mg via INTRAVENOUS
  Administered 2019-06-02: 20 mg via INTRAVENOUS
  Administered 2019-06-02: 10 mg via INTRAVENOUS
  Administered 2019-06-02: 20 mg via INTRAVENOUS

## 2019-06-02 MED ORDER — PHENYLEPHRINE 40 MCG/ML (10ML) SYRINGE FOR IV PUSH (FOR BLOOD PRESSURE SUPPORT)
PREFILLED_SYRINGE | INTRAVENOUS | Status: AC
  Filled 2019-06-02: qty 10

## 2019-06-02 MED ORDER — MIDAZOLAM HCL 5 MG/5ML IJ SOLN
INTRAMUSCULAR | Status: DC | PRN
  Administered 2019-06-02: 2 mg via INTRAVENOUS

## 2019-06-02 MED ORDER — PROPOFOL 10 MG/ML IV BOLUS
INTRAVENOUS | Status: DC | PRN
  Administered 2019-06-02: 150 mg via INTRAVENOUS

## 2019-06-02 MED ORDER — CHLORHEXIDINE GLUCONATE CLOTH 2 % EX PADS
6.0000 | MEDICATED_PAD | Freq: Every day | CUTANEOUS | Status: DC
  Administered 2019-06-02 – 2019-06-10 (×8): 6 via TOPICAL

## 2019-06-02 MED ORDER — ENOXAPARIN SODIUM 40 MG/0.4ML ~~LOC~~ SOLN
40.0000 mg | SUBCUTANEOUS | Status: DC
  Administered 2019-06-03 – 2019-06-04 (×2): 40 mg via SUBCUTANEOUS
  Filled 2019-06-02 (×2): qty 0.4

## 2019-06-02 MED ORDER — LACTATED RINGERS IV SOLN: INTRAVENOUS | Status: DC

## 2019-06-02 MED ORDER — PANTOPRAZOLE SODIUM 40 MG PO TBEC
80.0000 mg | DELAYED_RELEASE_TABLET | Freq: Every day | ORAL | Status: DC
  Administered 2019-06-03 – 2019-06-11 (×9): 80 mg via ORAL
  Filled 2019-06-02 (×9): qty 2

## 2019-06-02 MED ORDER — SCOPOLAMINE 1 MG/3DAYS TD PT72: 1.0000 | MEDICATED_PATCH | TRANSDERMAL | Status: DC

## 2019-06-02 MED ORDER — DEXAMETHASONE SODIUM PHOSPHATE 10 MG/ML IJ SOLN
INTRAMUSCULAR | Status: DC | PRN
  Administered 2019-06-02: 8 mg via INTRAVENOUS

## 2019-06-02 MED ORDER — LIDOCAINE 2% (20 MG/ML) 5 ML SYRINGE
INTRAMUSCULAR | Status: DC | PRN
  Administered 2019-06-02: 1.5 mg/kg/h via INTRAVENOUS

## 2019-06-02 MED ORDER — HYDROMORPHONE 1 MG/ML IV SOLN
INTRAVENOUS | Status: DC
  Administered 2019-06-02: 3.3 mg via INTRAVENOUS
  Administered 2019-06-02: 30 mg via INTRAVENOUS
  Administered 2019-06-02: 3.7 mg via INTRAVENOUS
  Administered 2019-06-03: 3.6 mg via INTRAVENOUS
  Administered 2019-06-03: 4.2 mg via INTRAVENOUS
  Administered 2019-06-03: 30 mg via INTRAVENOUS
  Administered 2019-06-03: 3.28 mg via INTRAVENOUS
  Administered 2019-06-03: 3.9 mg via INTRAVENOUS
  Administered 2019-06-03: 3 mg via INTRAVENOUS
  Administered 2019-06-04: 2.7 mg via INTRAVENOUS
  Administered 2019-06-04: 1.2 mg via INTRAVENOUS
  Administered 2019-06-04: 3.3 mg via INTRAVENOUS
  Administered 2019-06-04: 4.4 mg via INTRAVENOUS
  Administered 2019-06-04: 0.168 mg via INTRAVENOUS
  Administered 2019-06-04: 6.51 mg via INTRAVENOUS
  Administered 2019-06-05: 3 mg via INTRAVENOUS
  Administered 2019-06-05: 4.64 mg via INTRAVENOUS
  Administered 2019-06-05: 4.5 mg via INTRAVENOUS
  Administered 2019-06-05: 3.66 mg via INTRAVENOUS
  Administered 2019-06-05 (×2): 30 mg via INTRAVENOUS
  Administered 2019-06-06: 3.44 mg via INTRAVENOUS
  Filled 2019-06-02 (×12): qty 30

## 2019-06-02 MED ORDER — VORTIOXETINE HBR 5 MG PO TABS
5.0000 mg | ORAL_TABLET | Freq: Every day | ORAL | Status: DC
  Administered 2019-06-03 – 2019-06-11 (×9): 5 mg via ORAL
  Filled 2019-06-02 (×9): qty 1

## 2019-06-02 MED ORDER — DIPHENHYDRAMINE HCL 50 MG/ML IJ SOLN: 25.0000 mg | Freq: Four times a day (QID) | INTRAMUSCULAR | Status: DC | PRN

## 2019-06-02 MED ORDER — DIPHENHYDRAMINE HCL 25 MG PO CAPS
25.0000 mg | ORAL_CAPSULE | Freq: Four times a day (QID) | ORAL | Status: DC | PRN
  Administered 2019-06-08: 25 mg via ORAL
  Filled 2019-06-02: qty 1

## 2019-06-02 MED ORDER — LIDOCAINE HCL 2 % IJ SOLN
INTRAMUSCULAR | Status: AC
  Filled 2019-06-02: qty 20

## 2019-06-02 MED ORDER — LACTATED RINGERS IV SOLN
INTRAVENOUS | Status: DC
  Administered 2019-06-02: 16:00:00 via INTRAVENOUS

## 2019-06-02 MED ORDER — EPHEDRINE 5 MG/ML INJ
INTRAVENOUS | Status: AC
  Filled 2019-06-02: qty 10

## 2019-06-02 MED ORDER — LIDOCAINE 2% (20 MG/ML) 5 ML SYRINGE
INTRAMUSCULAR | Status: AC
  Filled 2019-06-02: qty 5

## 2019-06-02 MED ORDER — PROMETHAZINE HCL 25 MG/ML IJ SOLN
6.2500 mg | INTRAMUSCULAR | Status: DC | PRN
  Administered 2019-06-02: 6.25 mg via INTRAVENOUS

## 2019-06-02 MED ORDER — KETOROLAC TROMETHAMINE 30 MG/ML IJ SOLN: 30.0000 mg | Freq: Once | INTRAMUSCULAR | Status: DC | PRN

## 2019-06-02 MED ORDER — PROMETHAZINE HCL 25 MG/ML IJ SOLN
INTRAMUSCULAR | Status: AC
  Filled 2019-06-02: qty 1

## 2019-06-02 MED ORDER — GABAPENTIN 300 MG PO CAPS
300.0000 mg | ORAL_CAPSULE | ORAL | Status: AC
  Administered 2019-06-02: 300 mg via ORAL
  Filled 2019-06-02: qty 1

## 2019-06-02 MED ORDER — METHOCARBAMOL 1000 MG/10ML IJ SOLN: 1000.0000 mg | Freq: Three times a day (TID) | INTRAMUSCULAR | Status: DC | PRN

## 2019-06-02 MED ORDER — METHOCARBAMOL 1000 MG/10ML IJ SOLN
1000.0000 mg | Freq: Three times a day (TID) | INTRAVENOUS | Status: DC | PRN
  Administered 2019-06-02: 23:00:00 1000 mg via INTRAVENOUS
  Filled 2019-06-02: qty 10

## 2019-06-02 MED ORDER — ACETAMINOPHEN 500 MG PO TABS
1000.0000 mg | ORAL_TABLET | ORAL | Status: AC
  Administered 2019-06-02: 1000 mg via ORAL
  Filled 2019-06-02: qty 2

## 2019-06-02 MED ORDER — LIDOCAINE 2% (20 MG/ML) 5 ML SYRINGE
INTRAMUSCULAR | Status: DC | PRN
  Administered 2019-06-02: 80 mg via INTRAVENOUS

## 2019-06-02 MED ORDER — SUGAMMADEX SODIUM 200 MG/2ML IV SOLN
INTRAVENOUS | Status: DC | PRN
  Administered 2019-06-02: 150 mg via INTRAVENOUS

## 2019-06-02 MED ORDER — ALUM & MAG HYDROXIDE-SIMETH 200-200-20 MG/5ML PO SUSP
30.0000 mL | Freq: Four times a day (QID) | ORAL | Status: DC | PRN
  Administered 2019-06-09 (×3): 30 mL via ORAL
  Filled 2019-06-02 (×3): qty 30

## 2019-06-02 MED ORDER — FENTANYL CITRATE (PF) 100 MCG/2ML IJ SOLN
INTRAMUSCULAR | Status: DC | PRN
  Administered 2019-06-02: 50 ug via INTRAVENOUS
  Administered 2019-06-02: 100 ug via INTRAVENOUS
  Administered 2019-06-02 (×4): 50 ug via INTRAVENOUS
  Administered 2019-06-02: 100 ug via INTRAVENOUS
  Administered 2019-06-02 (×2): 50 ug via INTRAVENOUS

## 2019-06-02 MED ORDER — LISINOPRIL 5 MG PO TABS
5.0000 mg | ORAL_TABLET | Freq: Every day | ORAL | Status: DC
  Administered 2019-06-02 – 2019-06-03 (×2): 5 mg via ORAL
  Filled 2019-06-02 (×3): qty 1

## 2019-06-02 MED ORDER — OXYCODONE HCL 5 MG/5ML PO SOLN: 5.0000 mg | Freq: Once | ORAL | Status: DC | PRN

## 2019-06-02 MED ORDER — ONDANSETRON HCL 4 MG/2ML IJ SOLN
4.0000 mg | Freq: Four times a day (QID) | INTRAMUSCULAR | Status: DC | PRN
  Administered 2019-06-03 – 2019-06-11 (×7): 4 mg via INTRAVENOUS
  Filled 2019-06-02 (×7): qty 2

## 2019-06-02 MED ORDER — BUPIVACAINE LIPOSOME 1.3 % IJ SUSP
INTRAMUSCULAR | Status: DC | PRN
  Administered 2019-06-02: 20 mL

## 2019-06-02 MED ORDER — SACCHAROMYCES BOULARDII 250 MG PO CAPS
250.0000 mg | ORAL_CAPSULE | Freq: Two times a day (BID) | ORAL | Status: DC
  Administered 2019-06-02 – 2019-06-11 (×18): 250 mg via ORAL
  Filled 2019-06-02 (×18): qty 1

## 2019-06-02 MED ORDER — ALVIMOPAN 12 MG PO CAPS
12.0000 mg | ORAL_CAPSULE | Freq: Two times a day (BID) | ORAL | Status: DC
  Administered 2019-06-03 – 2019-06-04 (×4): 12 mg via ORAL
  Filled 2019-06-02 (×4): qty 1

## 2019-06-02 MED ORDER — HYDROMORPHONE HCL 1 MG/ML IJ SOLN
1.0000 mg | INTRAMUSCULAR | Status: DC | PRN
  Administered 2019-06-02: 2 mg via INTRAVENOUS
  Administered 2019-06-03: 1 mg via INTRAVENOUS
  Filled 2019-06-02: qty 1

## 2019-06-02 MED ORDER — ACETAMINOPHEN 500 MG PO TABS
1000.0000 mg | ORAL_TABLET | Freq: Four times a day (QID) | ORAL | Status: AC
  Administered 2019-06-02 – 2019-06-09 (×25): 1000 mg via ORAL
  Filled 2019-06-02 (×26): qty 2

## 2019-06-02 MED ORDER — MIDAZOLAM HCL 2 MG/2ML IJ SOLN
INTRAMUSCULAR | Status: AC
  Filled 2019-06-02: qty 2

## 2019-06-02 SURGICAL SUPPLY — 71 items
ADH SKN CLS APL DERMABOND .7 (GAUZE/BANDAGES/DRESSINGS) ×1
APL PRP STRL LF DISP 70% ISPRP (MISCELLANEOUS) ×1
BLADE EXTENDED COATED 6.5IN (ELECTRODE) ×3 IMPLANT
CELLS DAT CNTRL 66122 CELL SVR (MISCELLANEOUS) ×1 IMPLANT
CHLORAPREP W/TINT 26 (MISCELLANEOUS) ×3 IMPLANT
COUNTER NEEDLE 20 DBL MAG RED (NEEDLE) ×3 IMPLANT
COVER MAYO STAND STRL (DRAPES) IMPLANT
COVER WAND RF STERILE (DRAPES) ×3 IMPLANT
DECANTER SPIKE VIAL GLASS SM (MISCELLANEOUS) IMPLANT
DERMABOND ADVANCED (GAUZE/BANDAGES/DRESSINGS) ×2
DERMABOND ADVANCED .7 DNX12 (GAUZE/BANDAGES/DRESSINGS) ×1 IMPLANT
DRAIN CHANNEL 19F RND (DRAIN) ×3 IMPLANT
DRAPE LAPAROSCOPIC ABDOMINAL (DRAPES) IMPLANT
DRSG OPSITE POSTOP 4X10 (GAUZE/BANDAGES/DRESSINGS) IMPLANT
DRSG OPSITE POSTOP 4X6 (GAUZE/BANDAGES/DRESSINGS) IMPLANT
DRSG OPSITE POSTOP 4X8 (GAUZE/BANDAGES/DRESSINGS) IMPLANT
ELECT PENCIL ROCKER SW 15FT (MISCELLANEOUS) ×3 IMPLANT
ELECT REM PT RETURN 15FT ADLT (MISCELLANEOUS) ×3 IMPLANT
EVACUATOR SILICONE 100CC (DRAIN) ×3 IMPLANT
GAUZE SPONGE 4X4 12PLY STRL (GAUZE/BANDAGES/DRESSINGS) ×3 IMPLANT
GLOVE BIO SURGEON STRL SZ 6.5 (GLOVE) ×4 IMPLANT
GLOVE BIO SURGEONS STRL SZ 6.5 (GLOVE) ×2
GLOVE BIOGEL PI IND STRL 7.0 (GLOVE) ×2 IMPLANT
GLOVE BIOGEL PI INDICATOR 7.0 (GLOVE) ×4
GOWN STRL REUS W/TWL 2XL LVL3 (GOWN DISPOSABLE) ×6 IMPLANT
GOWN STRL REUS W/TWL XL LVL3 (GOWN DISPOSABLE) ×12 IMPLANT
HANDLE SUCTION POOLE (INSTRUMENTS) ×1 IMPLANT
HEMOSTAT SURGICEL 4X8 (HEMOSTASIS) ×3 IMPLANT
KIT DRSG PREVENA PLUS 7DAY 125 (MISCELLANEOUS) ×3 IMPLANT
KIT PREVENA INCISION MGT 13 (CANNISTER) ×3 IMPLANT
KIT TURNOVER KIT A (KITS) IMPLANT
LEGGING LITHOTOMY PAIR STRL (DRAPES) ×3 IMPLANT
LIGASURE IMPACT 36 18CM CVD LR (INSTRUMENTS) ×3 IMPLANT
MESH PHASIX ST 25X30 (Mesh General) ×3 IMPLANT
PACK COLON (CUSTOM PROCEDURE TRAY) ×3 IMPLANT
PAD POSITIONING PINK XL (MISCELLANEOUS) IMPLANT
PROTECTOR NERVE ULNAR (MISCELLANEOUS) ×3 IMPLANT
RELOAD PROXIMATE 75MM BLUE (ENDOMECHANICALS) ×6 IMPLANT
RETRACTOR WND ALEXIS 25 LRG (MISCELLANEOUS) ×1 IMPLANT
RTRCTR WOUND ALEXIS 18CM MED (MISCELLANEOUS) ×3
RTRCTR WOUND ALEXIS 25CM LRG (MISCELLANEOUS) ×3
SEALER TISSUE G2 STRG ARTC 35C (ENDOMECHANICALS) IMPLANT
SPONGE DRAIN TRACH 4X4 STRL 2S (GAUZE/BANDAGES/DRESSINGS) ×3 IMPLANT
SPONGE LAP 18X18 RF (DISPOSABLE) ×21 IMPLANT
STAPLER ECHELON POWER CIR 29 (STAPLE) ×3 IMPLANT
STAPLER GUN LINEAR PROX 60 (STAPLE) ×3 IMPLANT
STAPLER PROXIMATE 75MM BLUE (STAPLE) ×3 IMPLANT
STAPLER VISISTAT 35W (STAPLE) ×3 IMPLANT
SUCTION POOLE HANDLE (INSTRUMENTS) ×3
SURGILUBE 2OZ TUBE FLIPTOP (MISCELLANEOUS) ×3 IMPLANT
SUT ETHILON 2 0 PS N (SUTURE) ×3 IMPLANT
SUT NOVA NAB DX-16 0-1 5-0 T12 (SUTURE) ×6 IMPLANT
SUT NOVA NAB GS-21 0 18 T12 DT (SUTURE) IMPLANT
SUT PDS AB 1 CT1 27 (SUTURE) ×6 IMPLANT
SUT PDS AB 1 CTX 36 (SUTURE) ×6 IMPLANT
SUT PDS AB 1 TP1 96 (SUTURE) IMPLANT
SUT PROLENE 2 0 KS (SUTURE) IMPLANT
SUT SILK 2 0 (SUTURE) ×3
SUT SILK 2 0 SH CR/8 (SUTURE) ×3 IMPLANT
SUT SILK 2-0 18XBRD TIE 12 (SUTURE) ×1 IMPLANT
SUT SILK 3 0 (SUTURE) ×3
SUT SILK 3 0 SH CR/8 (SUTURE) ×3 IMPLANT
SUT SILK 3-0 18XBRD TIE 12 (SUTURE) ×1 IMPLANT
SUT VIC AB 2-0 SH 18 (SUTURE) ×6 IMPLANT
SUT VIC AB 4-0 PS2 27 (SUTURE) ×3 IMPLANT
SUT VICRYL 0 UR6 27IN ABS (SUTURE) IMPLANT
TAPE CLOTH SURG 4X10 WHT LF (GAUZE/BANDAGES/DRESSINGS) ×3 IMPLANT
TOWEL OR NON WOVEN STRL DISP B (DISPOSABLE) ×6 IMPLANT
TRAY FOLEY MTR SLVR 16FR STAT (SET/KITS/TRAYS/PACK) ×3 IMPLANT
TUBING CONNECTING 10 (TUBING) ×4 IMPLANT
TUBING CONNECTING 10' (TUBING) ×2

## 2019-06-02 NOTE — Transfer of Care (Signed)
Immediate Anesthesia Transfer of Care Note  Patient: Julie Williams  Procedure(s) Performed: OPEN COLOSTOMY REVERSAL lysis of adhesions,drainage intraperitoneal abcess small bowel resection and scar revison.componentseparation hernia repair with phasix mesh (N/A Abdomen)  Patient Location: PACU  Anesthesia Type:General  Level of Consciousness: awake, alert , oriented and patient cooperative  Airway & Oxygen Therapy: Patient Spontanous Breathing and Patient connected to face mask oxygen  Post-op Assessment: Report given to RN, Post -op Vital signs reviewed and stable and Patient moving all extremities  Post vital signs: Reviewed and stable  Last Vitals:  Vitals Value Taken Time  BP 78/61 06/02/19 1331  Temp    Pulse 102 06/02/19 1334  Resp 13 06/02/19 1334  SpO2 98 % 06/02/19 1334  Vitals shown include unvalidated device data.  Last Pain:  Vitals:   06/02/19 0645  TempSrc: Oral         Complications: No apparent anesthesia complications

## 2019-06-02 NOTE — Progress Notes (Signed)
Pt c/o pain 10/10, Dilaudid PCA. Encouraged to PCA pump. Crying  And aggressive. Paged MD.

## 2019-06-02 NOTE — H&P (Signed)
The patient is a 53 year old female who presents with colonic obstruction. 53 year old female who presents to the office for evaluation of colostomy reversal. She underwent a Hartmann's procedure in late December 2019 at [facility] for a stercoral-type perforation. On the operative report . The perforation was noted to be at the rectosigmoid junction. She had copious amounts of stool within her belly. She developed several postoperative abscesses. She required ventilation for aspiration pneumonia and was transferred to [facility]. She was discharged in February 2020.  She is back to her baseline. She takes chronic narcotics for chronic neck pain.   Past Surgical History  Cesarean Section - 1  Colon Polyp Removal - Colonoscopy  Colon Polyp Removal - Open  Knee Surgery  Right. Spinal Surgery - Neck  Shoulder Surgery  Right.  Diagnostic Studies History  Colonoscopy  1-5 years ago Mammogram  within last year Pap Smear  1-5 years ago  Allergies  No Known Drug Allergies  [02/22/2019]: Allergies Reconciled   Medication History  Morphine Sulfate (30MG  Tablet, Oral) Active. Citalopram Hydrobromide (20MG  Tablet, Oral) Active. Ondansetron HCl (4MG  Tablet, Oral) Active. Levothyroxine Sodium (25MCG Tablet, Oral) Active. Medications Reconciled  Social History  Tobacco use  Former smoker. Alcohol use  Occasional alcohol use. Caffeine use  Coffee.  Family History  Alcohol Abuse  Mother, Sister. Depression  Family Members In General, Mother, Sister. Colon Polyps  Family Members In General. Heart Disease  Father. Hypertension  Father.  Pregnancy / Birth History  Age at menarche  55 years. Gravida  1 Length (months) of breastfeeding  3-6 Maternal age  81-25 Para  1  Other Problems  Depression  Gastroesophageal Reflux Disease     Review of Systems  General Not Present- Appetite Loss, Chills, Fatigue, Fever, Night  Sweats, Weight Gain and Weight Loss. Skin Not Present- Change in Wart/Mole, Dryness, Hives, Jaundice, New Lesions, Non-Healing Wounds, Rash and Ulcer. HEENT Present- Hearing Loss, Ringing in the Ears and Seasonal Allergies. Not Present- Earache, Hoarseness, Nose Bleed, Oral Ulcers, Sinus Pain, Sore Throat, Visual Disturbances, Wears glasses/contact lenses and Yellow Eyes. Respiratory Not Present- Bloody sputum, Chronic Cough, Difficulty Breathing, Snoring and Wheezing. Breast Not Present- Breast Mass, Breast Pain, Nipple Discharge and Skin Changes. Cardiovascular Not Present- Chest Pain, Difficulty Breathing Lying Down, Leg Cramps, Palpitations, Rapid Heart Rate, Shortness of Breath and Swelling of Extremities. Gastrointestinal Not Present- Abdominal Pain, Bloating, Bloody Stool, Change in Bowel Habits, Chronic diarrhea, Constipation, Difficulty Swallowing, Excessive gas, Gets full quickly at meals, Hemorrhoids, Indigestion, Nausea, Rectal Pain and Vomiting. Female Genitourinary Not Present- Frequency, Nocturia, Painful Urination, Pelvic Pain and Urgency. Musculoskeletal Not Present- Back Pain, Joint Pain, Joint Stiffness, Muscle Pain, Muscle Weakness and Swelling of Extremities. Neurological Not Present- Decreased Memory, Fainting, Headaches, Numbness, Seizures, Tingling, Tremor, Trouble walking and Weakness. Psychiatric Present- Depression. Not Present- Anxiety, Bipolar, Change in Sleep Pattern, Fearful and Frequent crying. Endocrine Present- Hair Changes. Not Present- Cold Intolerance, Excessive Hunger, Heat Intolerance, Hot flashes and New Diabetes. Hematology Not Present- Blood Thinners, Easy Bruising, Excessive bleeding, Gland problems, HIV and Persistent Infections.     Physical Exam  General Mental Status-Alert. General Appearance-Not in acute distress. Build & Nutrition-Well nourished. Posture-Normal posture. Gait-Normal.  Head and Neck Head-normocephalic,  atraumatic with no lesions or palpable masses. Trachea-midline.  Chest and Lung Exam Chest and lung exam reveals -on auscultation, normal breath sounds, no adventitious sounds and normal vocal resonance.  Cardiovascular Cardiovascular examination reveals -normal heart sounds, regular rate and rhythm with  no murmurs and no digital clubbing, cyanosis, edema, increased warmth or tenderness.  Abdomen Inspection Inspection of the abdomen reveals - No Hernias. Skin - Scar - Epigastrium (healing well) . Palpation/Percussion Palpation and Percussion of the abdomen reveal - Soft, Non Tender, No Rigidity (guarding), No hepatosplenomegaly and No Palpable abdominal masses.  Neurologic Neurologic evaluation reveals -alert and oriented x 3 with no impairment of recent or remote memory, normal attention span and ability to concentrate, normal sensation and normal coordination.  Musculoskeletal Normal Exam - Bilateral-Upper Extremity Strength Normal and Lower Extremity Strength Normal.    Assessment & Plan   STERCORAL ULCER OF RECTUM (K62.6) Impression: 53 year old female who underwent a Hartman's procedure in late December 2019 at [facility] for a stercoral perforation of the rectosigmoid junction. She had a prolonged hospitalization requiring ventilation and transfer to [facility]. She was discharged in February. She underwent a repeat colonoscopy which was normal.  She is now ready for her surgery. The surgery and anatomy were described to the patient as well as the risks of surgery and the possible complications.  These include: Bleeding, deep abdominal infections and possible wound complications such as hernia and infection, damage to adjacent structures, leak of surgical connections, which can lead to other surgeries and possibly an ostomy, possible need for other procedures, such as abscess drains in radiology, possible prolonged hospital stay, possible diarrhea  from removal of part of the colon, possible constipation from narcotics, prolonged fatigue/weakness or appetite loss, possible early recurrence of of disease, possible complications of their medical problems such as heart disease or arrhythmias or lung problems, death (less than 1%). I believe the patient understands and wishes to proceed with the surgery.

## 2019-06-02 NOTE — Progress Notes (Addendum)
Received call from pharmacy  regarding pt's PRN dilaudid. Pharmacist said pt already on dilaudid PCA,and  So has not verified PRN order. MD notified regarding this situation . Pt c/o pain 10/10,so aggressive, telling if you are not helping with my pain I will leave. She trying to call someone But she did not call.  Given 2mg  IV dilaudid IV push per MD order. Notified AC Beth.  Will continue to monitor.

## 2019-06-02 NOTE — Op Note (Addendum)
06/02/2019  1:07 PM  PATIENT:  Julie Williams  53 y.o. female  Patient Care Team: Ladona Horns, MD as PCP - General Stann Mainland Elly Modena, MD as Consulting Physician (Orthopedic Surgery) Lininger, Juanda Bond., MD as Consulting Physician (Surgery) Annia Belt, MD as Attending Physician (Internal Medicine) Armbruster, Carlota Raspberry, MD as Consulting Physician (Gastroenterology)  PRE-OPERATIVE DIAGNOSIS:  COLOSTOMY, INCISIONAL HERNIA  POST-OPERATIVE DIAGNOSIS:  SAME  PROCEDURE:   Open colostomy reversal, lysis of adhesions for ~2 hours, drainage intraperitoneal abscesses, small bowel resection, Incisional hernia repair with component separation and underlay phasix mesh    Surgeon(s): Leighton Ruff, MD Michael Boston, MD  ASSISTANT: Dr Johney Maine   ANESTHESIA:   general  EBL:  Total I/O In: 2500 [I.V.:2000; IV Piggyback:500] Out: 730 [Urine:480; Blood:250]  DRAINS: (13F) Jackson-Pratt drain(s) with closed bulb suction in the SQ   SPECIMEN:  Source of Specimen:  colostomy, small bowel  DISPOSITION OF SPECIMEN:  PATHOLOGY  COUNTS:  YES  PLAN OF CARE: Admit to inpatient   PATIENT DISPOSITION:  PACU - hemodynamically stable.  INDICATION: 53 year old female who underwent a catastrophic abdominal perforation approximately 1 year ago.  She was in the hospital for approximately 5 months.  She is now recovered and is ready for colostomy reversal.  She has an obvious incisional hernia.   OR FINDINGS: Significant adhesions in the pelvis, 2 interloop abscesses containing purulence, incisional hernia  DESCRIPTION: the patient was identified in the preoperative holding area and taken to the OR where they were laid supine on the operating room table.  General anesthesia was induced without difficulty. SCDs were also noted to be in place prior to the initiation of anesthesia.  The patient was then prepped and draped in the usual sterile fashion.   A surgical timeout was performed  indicating the correct patient, procedure, positioning and need for preoperative antibiotics.   I began by making an incision in the abdominal skin using a 10 blade scalpel.  This was carried down through the subcutaneous tissues until the hernia sac was transected.  The small bowel was gently manipulated off of the hernia sac and the entire incision was opened.  We placed a wound protector and began to lyse adhesions.  I continued laterally in both directions to the lateral sidewalls.  There are multiple dense adhesions noted within the pelvis.  I identified the bladder and freed this from the omentum.  This appeared to have been mobilized during her previous surgery.  Once the bladder was free the remaining omentum was freed from the loops of small bowel underneath.  There was a mass in the right lower quadrant.  It was surrounded by small bowel loops.  I dissected this free and found an intraperitoneal abscess with purulence.  This was aspirated and the loops were taken down around this abscess site.  I then traced this portion of small bowel down to the pelvis where it was adherent to the presacral plane.  I bluntly mobilized this out of the pelvis.  During this mobilization the small bowel was determined to be nonviable.  This was next to the intraperitoneal abscess and so we decided to resect this portion of small intestines.  This was done using 2 blue load GIA 75 mm staplers.  The mesentery was divided using a LigaSure device.  An anastomosis was then created using a 75 mm blue load stapler and a TA 60 mm stapler was used to close the common enterotomy.  The mesenteric defect was closed  using a running 2-0 silk suture.  We then continue to lyse adhesions in the right upper quadrant.  Another smaller peri-colonic abscess was identified and irrigated and aspirated.  Once the small bowel loops were out of the pelvis and left lower quadrant, we were able to identify the rectal stump.  The ureters were  identified in the normal anatomic positions but slightly medial.  Both did not appear to be affected by her abdominal adhesions.  The uterus was then mobilized away from the rectal stump and we used rectal dilators to pass from the anal canal to confirm patency.  Once this was completed, the left upper quadrant ostomy was taken down using Bovie electrocautery and blunt dissection.  This was sealed off and placed into the abdomen.  I mobilized the remaining colon off of the omentum and small bowel that were adherent in the left upper quadrant.  This allowed for mobilization of the colon down to the pelvis.  At this point the colostomy was transected and a pursestring device was placed.  A 2-0 Prolene pursestring suture was used to secure the colon around a 29 mm EEA anvil.  The EEA was then inserted into the rectum and brought out through the anterior portion of the distal rectal stump.  An anastomosis was created.  There was no tension.  There was no leak when tested with insufflation under irrigation.  We then copiously irrigated the abdominal cavity with approximately 7 L of warm normal saline.  The small bowel was inspected.  There were no injuries noted.  We then turned our attention to closing her fascial defect.  The colostomy site was closed using interrupted #1 Novafil sutures.  The patient fascia was mobilized laterally on each side.  This did not allow complete closure.  We decided to attempt to separate the peritoneum and perform a component separation.  The peritoneum was taken down off of the rectus.  We were able to do a component separation TAR release in the right lateral sidewall.  This allowed for the fascia to mobilize towards midline.  A relaxing incision was then made to allow for complete closure of the fascia.  We then placed a Phasix 20 x 30 cm dual sided mesh into the abdomen as an intraperitoneal underlay mesh,, as we were unable to get the peritoneum to close completely over the  abdominal contents.  Once this was completed, this was secured to the edges of the peritoneum using running 0 Vicryl sutures.  The fascia was then closed over this mesh with a running #1 PDS suture x2.  Exparel was placed along the lateral edges for postoperative pain relief.  A 19 [place] Blake drain was placed in the subcutaneous space and brought out through the right lower quadrant.  This was secured into the place with a 2-0 nylon suture.  I then trimmed off the excess skin from the abdominal wall using a 10 blade scalpel.  The dermal layer was reapproximated using staples.  I used a 2-0 Vicryl pursestring suture to partially close the colostomy site and placed a sterile Telfa wick in the middle.  A dressing was applied over this.  I used a Praveena wound VAC system over the closed abdominal incision.  The patient was then awakened from anesthesia and sent to the postanesthesia care unit in stable condition.  All counts were correct per operating room staff.

## 2019-06-02 NOTE — Progress Notes (Signed)
Pt is just resting,Administered PRN robaxin. No acute distress.will continue to monitor.

## 2019-06-02 NOTE — Anesthesia Procedure Notes (Signed)
Procedure Name: Intubation Date/Time: 06/02/2019 8:38 AM Performed by: Victoriano Lain, CRNA Pre-anesthesia Checklist: Patient identified, Emergency Drugs available, Suction available, Patient being monitored and Timeout performed Patient Re-evaluated:Patient Re-evaluated prior to induction Oxygen Delivery Method: Circle system utilized Preoxygenation: Pre-oxygenation with 100% oxygen Induction Type: IV induction Ventilation: Mask ventilation without difficulty Laryngoscope Size: Mac and 4 Grade View: Grade I Tube type: Oral Tube size: 7.5 mm Number of attempts: 1 Airway Equipment and Method: Stylet Placement Confirmation: ETT inserted through vocal cords under direct vision,  positive ETCO2 and breath sounds checked- equal and bilateral Secured at: 21 cm Tube secured with: Tape Dental Injury: Teeth and Oropharynx as per pre-operative assessment  Comments: Head maintained in a neutral position during intubation. Pt has a history of ACDF, pt stated she had full range of motion with her neck.

## 2019-06-03 ENCOUNTER — Encounter (HOSPITAL_COMMUNITY): Payer: Self-pay | Admitting: Surgery

## 2019-06-03 ENCOUNTER — Encounter: Payer: PPO | Admitting: Internal Medicine

## 2019-06-03 ENCOUNTER — Other Ambulatory Visit: Payer: Self-pay

## 2019-06-03 DIAGNOSIS — K432 Incisional hernia without obstruction or gangrene: Secondary | ICD-10-CM

## 2019-06-03 DIAGNOSIS — E039 Hypothyroidism, unspecified: Secondary | ICD-10-CM

## 2019-06-03 DIAGNOSIS — G894 Chronic pain syndrome: Secondary | ICD-10-CM

## 2019-06-03 LAB — CBC
HCT: 29.3 % — ABNORMAL LOW (ref 36.0–46.0)
Hemoglobin: 9.4 g/dL — ABNORMAL LOW (ref 12.0–15.0)
MCH: 34.3 pg — ABNORMAL HIGH (ref 26.0–34.0)
MCHC: 32.1 g/dL (ref 30.0–36.0)
MCV: 106.9 fL — ABNORMAL HIGH (ref 80.0–100.0)
Platelets: 229 10*3/uL (ref 150–400)
RBC: 2.74 MIL/uL — ABNORMAL LOW (ref 3.87–5.11)
RDW: 11.9 % (ref 11.5–15.5)
WBC: 11.8 10*3/uL — ABNORMAL HIGH (ref 4.0–10.5)
nRBC: 0 % (ref 0.0–0.2)

## 2019-06-03 LAB — BASIC METABOLIC PANEL
Anion gap: 12 (ref 5–15)
BUN: 22 mg/dL — ABNORMAL HIGH (ref 6–20)
CO2: 22 mmol/L (ref 22–32)
Calcium: 8.8 mg/dL — ABNORMAL LOW (ref 8.9–10.3)
Chloride: 106 mmol/L (ref 98–111)
Creatinine, Ser: 1.96 mg/dL — ABNORMAL HIGH (ref 0.44–1.00)
GFR calc Af Amer: 33 mL/min — ABNORMAL LOW (ref >60)
GFR calc non Af Amer: 28 mL/min — ABNORMAL LOW (ref >60)
Glucose, Bld: 131 mg/dL — ABNORMAL HIGH (ref 70–99)
Potassium: 4.8 mmol/L (ref 3.5–5.1)
Sodium: 140 mmol/L (ref 135–145)

## 2019-06-03 LAB — SURGICAL PATHOLOGY

## 2019-06-03 MED ORDER — HYDROMORPHONE HCL 1 MG/ML IJ SOLN
1.0000 mg | INTRAMUSCULAR | Status: DC | PRN
  Administered 2019-06-03 (×3): 1 mg via INTRAVENOUS
  Filled 2019-06-03 (×3): qty 1

## 2019-06-03 MED ORDER — KCL IN DEXTROSE-NACL 30-5-0.45 MEQ/L-%-% IV SOLN
INTRAVENOUS | Status: DC
  Administered 2019-06-03: 11:00:00 via INTRAVENOUS
  Filled 2019-06-03 (×2): qty 1000

## 2019-06-03 MED ORDER — DIAZEPAM 5 MG/ML IJ SOLN
2.5000 mg | Freq: Four times a day (QID) | INTRAMUSCULAR | Status: DC | PRN
  Administered 2019-06-03 (×2): 2.5 mg via INTRAVENOUS
  Administered 2019-06-04: 5 mg via INTRAVENOUS
  Administered 2019-06-07: 2.5 mg via INTRAVENOUS
  Filled 2019-06-03 (×4): qty 2

## 2019-06-03 MED ORDER — METHOCARBAMOL 1000 MG/10ML IJ SOLN
1000.0000 mg | Freq: Three times a day (TID) | INTRAVENOUS | Status: DC
  Administered 2019-06-03 – 2019-06-04 (×4): 1000 mg via INTRAVENOUS
  Filled 2019-06-03 (×7): qty 10

## 2019-06-03 MED ORDER — BISACODYL 10 MG RE SUPP: 10.0000 mg | Freq: Every day | RECTAL | Status: DC

## 2019-06-03 MED ORDER — MAGIC MOUTHWASH
15.0000 mL | Freq: Four times a day (QID) | ORAL | Status: DC | PRN
  Filled 2019-06-03: qty 15

## 2019-06-03 MED ORDER — HYDROCORTISONE 1 % EX CREA
1.0000 "application " | TOPICAL_CREAM | Freq: Three times a day (TID) | CUTANEOUS | Status: DC | PRN
  Filled 2019-06-03: qty 28

## 2019-06-03 MED ORDER — GUAIFENESIN-DM 100-10 MG/5ML PO SYRP: 10.0000 mL | ORAL_SOLUTION | ORAL | Status: DC | PRN

## 2019-06-03 MED ORDER — LIP MEDEX EX OINT
1.0000 "application " | TOPICAL_OINTMENT | Freq: Two times a day (BID) | CUTANEOUS | Status: DC
  Administered 2019-06-03 – 2019-06-10 (×11): 1 via TOPICAL
  Filled 2019-06-03 (×3): qty 7

## 2019-06-03 MED ORDER — HYDROCORTISONE (PERIANAL) 2.5 % EX CREA
1.0000 "application " | TOPICAL_CREAM | Freq: Four times a day (QID) | CUTANEOUS | Status: DC | PRN
  Filled 2019-06-03: qty 28.35

## 2019-06-03 MED ORDER — MENTHOL 3 MG MT LOZG
1.0000 | LOZENGE | OROMUCOSAL | Status: DC | PRN
  Filled 2019-06-03: qty 9

## 2019-06-03 MED ORDER — BUPIVACAINE LIPOSOME 1.3 % IJ SUSP
20.0000 mL | Freq: Once | INTRAMUSCULAR | Status: DC
  Filled 2019-06-03: qty 20

## 2019-06-03 MED ORDER — PHENOL 1.4 % MT LIQD
1.0000 | OROMUCOSAL | Status: DC | PRN
  Filled 2019-06-03 (×2): qty 177

## 2019-06-03 NOTE — Consult Note (Signed)
   [facility] CM Inpatient Consult   06/03/2019  Julie Williams Nov 23, 1965 [id number]    Patient checked for19% risk score of unplanned readmission. She had been previously outreached by [facility] care management coordinator for transition of care services.   Review of chart and MD history and physical reveal as:  Patient is a 53 year old female who presented with colonic obstruction. She presents to the office for evaluation of colostomy reversal. She underwent open colostomy reversal and hernia repair on 06/02/19.  Notedthat patient is currently listed as an Engaged [facility] patient. She will be followed by [facility] in the community, with full case management services.  [facility] care management services are not appropriate at this time.  Will sign off.   For questions, please contact:  Edwena Felty A. Elea Holtzclaw, BSN, RN-BC [facility] Liaison Cell: [phone]

## 2019-06-03 NOTE — Progress Notes (Signed)
1 Day Post-Op open colostomy reversal and hernia repair Subjective: Had a lot of trouble with pain last night.  Didn't sleep well  Objective: Vital signs in last 24 hours: Temp:  [97.9 F (36.6 C)-99.1 F (37.3 C)] 98.3 F (36.8 C) (10/08 0916) Pulse Rate:  [75-108] 108 (10/08 0916) Resp:  [9-19] 18 (10/08 0807) BP: (83-134)/(61-90) 104/61 (10/08 0916) SpO2:  [91 %-100 %] 98 % (10/08 0916)   Intake/Output from previous day: 10/07 0701 - 10/08 0700 In: 6416.5 [P.O.:240; I.V.:5276.5; IV Piggyback:900] Out: 4225 [Urine:2255; Emesis/NG output:1260; Drains:460; Blood:250] Intake/Output this shift: Total I/O In: 120 [P.O.:120] Out: 350 [Urine:100; Emesis/NG output:200; Drains:50]   General appearance: alert Cardio: tachy  Incision: no significant drainage  Lab Results:  Recent Labs    06/03/19 0324  WBC 11.8*  HGB 9.4*  HCT 29.3*  PLT 229   BMET Recent Labs    06/03/19 0324  NA 140  K 4.8  CL 106  CO2 22  GLUCOSE 131*  BUN 22*  CREATININE 1.96*  CALCIUM 8.8*   PT/INR No results for input(s): LABPROT, INR in the last 72 hours. ABG No results for input(s): PHART, HCO3 in the last 72 hours.  Invalid input(s): PCO2, PO2  MEDS, Scheduled . acetaminophen  1,000 mg Oral Q6H  . alvimopan  12 mg Oral BID  . Chlorhexidine Gluconate Cloth  6 each Topical Daily  . enoxaparin (LOVENOX) injection  40 mg Subcutaneous Q24H  . gabapentin  300 mg Oral BID  . HYDROmorphone   Intravenous Q4H  . levothyroxine  25 mcg Oral QAC breakfast  . lip balm  1 application Topical BID  . lisinopril  5 mg Oral Daily  . pantoprazole  80 mg Oral Daily  . saccharomyces boulardii  250 mg Oral BID  . vortioxetine HBr  5 mg Oral Daily    Studies/Results: No results found.  Assessment: s/p Procedure(s): OPEN COLOSTOMY REVERSAL lysis of adhesions,drainage intraperitoneal abcess small bowel resection and scar revison.componentseparation hernia repair with phasix mesh Patient Active  Problem List   Diagnosis Date Noted  . Hypothyroidism 06/03/2019  . Chronic pain syndrome 06/03/2019  . Incisional hernia s/p open component separation & repair w absorbable Phasix mesh 06/02/2019 06/03/2019  . Diverticulosis 03/2019  . Hair loss 01/06/2019  . Constipation 10/12/2018  . Intra-abdominal abscess ([place]) 10/10/2018  . Anemia due to chronic illness 10/01/2018  . Rectal perforation s/p partial colectomy/colostomy Jan 2020 08/27/2018  . Hx SBO 07/2018  . Impingement syndrome of right shoulder region 11/10/2017  . Vaginal atrophy 07/05/2016  . Acromioclavicular joint arthritis 12/25/2015  . Cervical spine degeneration 06/19/2015  . Full thickness rotator cuff tear 01/06/2015  . GERD (gastroesophageal reflux disease) 11/07/2014  . Hearing loss 03/04/2013  . Benign essential HTN 09/13/2011  . History of colonic polyps 09/13/2011  . Tobacco use disorder 07/10/2011  . Long term (current) use of opiate analgesic 06/04/2011  . Chronic neck pain 06/04/2011  . Depression 07/17/2009  . Rotator cuff syndrome of right shoulder 06/29/2009    Expected post op pain  Plan: Cont NG and NPO  Cont PCA with basal dose for pain control.  Cont scheduled robaxin and prn valium Cont IVF's  LOS: 1 day     .Rosario Adie, [facility], [place]   06/03/2019 10:18 AM

## 2019-06-03 NOTE — Progress Notes (Signed)
Pt refused to dangled in bed &  get up and walk this morning.

## 2019-06-03 NOTE — Anesthesia Postprocedure Evaluation (Signed)
Anesthesia Post Note  Patient: Julie Williams  Procedure(s) Performed: OPEN COLOSTOMY REVERSAL lysis of adhesions,drainage intraperitoneal abcess small bowel resection and scar revison.componentseparation hernia repair with phasix mesh (N/A Abdomen)     Patient location during evaluation: PACU Anesthesia Type: General Level of consciousness: awake Pain management: pain level controlled Vital Signs Assessment: post-procedure vital signs reviewed and stable Respiratory status: spontaneous breathing, nonlabored ventilation, respiratory function stable and patient connected to nasal cannula oxygen Cardiovascular status: blood pressure returned to baseline and stable Postop Assessment: no apparent nausea or vomiting Anesthetic complications: no    Last Vitals:  Vitals:   06/03/19 0419 06/03/19 0436  BP:  90/62  Pulse:  75  Resp: 19 16  Temp:  37.3 C  SpO2: 94% 100%    Last Pain:  Vitals:   06/03/19 0436  TempSrc: Oral  PainSc:                  Parish Augustine P Ayansh Feutz

## 2019-06-03 NOTE — Progress Notes (Signed)
Pt refused to have dulcolax supp.which was scheduled at 0400.

## 2019-06-03 NOTE — Evaluation (Signed)
Occupational Therapy Evaluation Patient Details Name: Julie Williams MRN: [id number] DOB: Jun 04, 1966 Today's Date: 06/03/2019    History of Present Illness 53 year old female admitted for open colostomy reversal, drainage of abscess, SB resction and hernia repair.  H/O rectal perforation with colostomy, HTN, depression, ARDS, acute renal failure,    Clinical Impression   Pt was admitted for the above sx. At baseline, she is independent with adls.  She was limited by pain and needs extensive assistance at this time. She was able to get up to chair with min guard, assist for lines, but she is a little impulsive. Will follow in acute setting with supervision level goals    Follow Up Recommendations  SNF;Supervision/Assistance - 24 hour;Home health OT(depending upon progress)    Equipment Recommendations  (to be further assessed)    Recommendations for Other Services       Precautions / Restrictions Precautions Precautions: Fall Precaution Comments: multiple lines, NG tube, JP drain, catheter, wound vac; abd binder Restrictions Weight Bearing Restrictions: No      Mobility Bed Mobility Overal bed mobility: Needs Assistance Bed Mobility: Supine to Sit     Supine to sit: Mod assist;HOB elevated     General bed mobility comments: assist to support weight of legs due to pain.  Pt used rail and assistance given for trunk  Transfers Overall transfer level: Needs assistance Equipment used: None Transfers: Sit to/from [facility] Sit to Stand: Min guard Stand pivot transfers: Min guard       General transfer comment: for safety. Pt tend to arm rest of chair    Balance                                           ADL either performed or assessed with clinical judgement   ADL Overall ADL's : Needs assistance/impaired                         Toilet Transfer: Stand-pivot;Min guard Toilet Transfer Details (indicate cue type and  reason): placed arms on chair.  Initially stated she would use RW but did not wait for it           General ADL Comments: Pt limited by pain.  Based on clinical judgment, pt needs min A for UB adls and total A for LB adls.  Pt with multiple tubes.  Pt very focused on 02 slipping.  Provided velfoam strap that went over her head like a headband to hold 02 in place behind her ears     Vision         Perception     Praxis      Pertinent Vitals/Pain Pain Assessment: Faces Faces Pain Scale: Hurts whole lot Pain Location: abdomen Pain Descriptors / Indicators: Aching Pain Intervention(s): Limited activity within patient's tolerance;Monitored during session;Repositioned;PCA encouraged     Hand Dominance     Extremity/Trunk Assessment Upper Extremity Assessment Upper Extremity Assessment: Generalized weakness           Communication Communication Communication: No difficulties   Cognition Arousal/Alertness: Awake/alert Behavior During Therapy: Anxious;Impulsive                                   General Comments: decreased safety getting up to chair.  Initiated standing  before chair was locked and lines out of her way. Tends to direct how things should be done   General Comments  HR into 120s to 130s during therapy; back to low 100s at end of session.  Sats high 80s to 90s on 02    Exercises     Shoulder Instructions      Home Living Family/patient expects to be discharged to:: Private residence Living Arrangements: Alone Available Help at Discharge: Friend(s);Available PRN/intermittently                                    Prior Functioning/Environment Level of Independence: Independent with assistive device(s)                 OT Problem List: Decreased strength;Decreased activity tolerance;Decreased safety awareness;Decreased knowledge of use of DME or AE;Pain;Cardiopulmonary status limiting activity      OT  Treatment/Interventions: Self-care/ADL training;Energy conservation;DME and/or AE instruction;Therapeutic activities;Patient/family education;Balance training    OT Goals(Current goals can be found in the care plan section) Acute Rehab OT Goals Patient Stated Goal: none stated; agreeable to OOB OT Goal Formulation: With patient Time For Goal Achievement: 06/17/19 Potential to Achieve Goals: Good ADL Goals Pt Will Transfer to Toilet: with supervision;ambulating;regular height toilet;bedside commode Pt Will Perform Toileting - Clothing Manipulation and hygiene: with supervision;sit to/from stand Additional ADL Goal #1: pt will perform adl with set up/AE as needed Additional ADL Goal #2: pt will perform bed mobility at supervison level in preparation for adls Additional ADL Goal #3: pt will not need any safety cues during adls/mobility  OT Frequency: Min 2X/week   Barriers to D/C:            Co-evaluation              AM-PAC OT "6 Clicks" Daily Activity     Outcome Measure Help from another person eating meals?: None(ice chips) Help from another person taking care of personal grooming?: A Little Help from another person toileting, which includes using toliet, bedpan, or urinal?: Total Help from another person bathing (including washing, rinsing, drying)?: A Lot Help from another person to put on and taking off regular upper body clothing?: A Little Help from another person to put on and taking off regular lower body clothing?: Total 6 Click Score: 14   End of Session    Activity Tolerance: Patient tolerated treatment well Patient left: in chair;with call bell/phone within reach  OT Visit Diagnosis: Muscle weakness (generalized) (M62.81)                Time: [id number] OT Time Calculation (min): 48 min Charges:  OT General Charges $OT Visit: 1 Visit OT Evaluation $OT Eval Moderate Complexity: 1 Mod OT Treatments $Therapeutic Activity: 23-37 mins  Lesle Chris,  OTR/L Acute Rehabilitation Services [phone] [facility] pager [phone] office 06/03/2019  [place] 06/03/2019, 1:00 PM

## 2019-06-03 NOTE — Progress Notes (Signed)
Pt is very anxious and c/o so much pain 10/10. Administered PRN dilaudid 1mg   and Valium 2.5mg  IV push per MD order. Will continue to monitor .

## 2019-06-03 NOTE — Progress Notes (Signed)
PT Cancellation Note  Patient Details Name: Julie Williams MRN: [id number] DOB: [birth year]   Cancelled Treatment:    Reason Eval/Treat Not Completed: Fatigue/lethargy limiting ability to participate; Pain limiting ability to participate(pt just transferred from recliner to bedside commode then to bed with nursing, she stated she's too fatigued and in too much pain to do PT at present. Will follow.)  Philomena Doheny PT 06/03/2019  Acute Rehabilitation Services Pager [phone] Office [phone]

## 2019-06-03 NOTE — Progress Notes (Signed)
Dilaudid PCA syringe was replaced, 15ml was wasted in steri cycle, witnessed by [employer].

## 2019-06-04 LAB — BASIC METABOLIC PANEL
Anion gap: 17 — ABNORMAL HIGH (ref 5–15)
BUN: 43 mg/dL — ABNORMAL HIGH (ref 6–20)
CO2: 29 mmol/L (ref 22–32)
Calcium: 8.6 mg/dL — ABNORMAL LOW (ref 8.9–10.3)
Chloride: 97 mmol/L — ABNORMAL LOW (ref 98–111)
Creatinine, Ser: 3.96 mg/dL — ABNORMAL HIGH (ref 0.44–1.00)
GFR calc Af Amer: 14 mL/min — ABNORMAL LOW (ref >60)
GFR calc non Af Amer: 12 mL/min — ABNORMAL LOW (ref >60)
Glucose, Bld: 138 mg/dL — ABNORMAL HIGH (ref 70–99)
Potassium: 3.7 mmol/L (ref 3.5–5.1)
Sodium: 143 mmol/L (ref 135–145)

## 2019-06-04 LAB — CBC
HCT: 25.7 % — ABNORMAL LOW (ref 36.0–46.0)
Hemoglobin: 8.4 g/dL — ABNORMAL LOW (ref 12.0–15.0)
MCH: 34 pg (ref 26.0–34.0)
MCHC: 32.7 g/dL (ref 30.0–36.0)
MCV: 104 fL — ABNORMAL HIGH (ref 80.0–100.0)
Platelets: 221 10*3/uL (ref 150–400)
RBC: 2.47 MIL/uL — ABNORMAL LOW (ref 3.87–5.11)
RDW: 11.9 % (ref 11.5–15.5)
WBC: 16.2 10*3/uL — ABNORMAL HIGH (ref 4.0–10.5)
nRBC: 0 % (ref 0.0–0.2)

## 2019-06-04 LAB — SODIUM, URINE, RANDOM: Sodium, Ur: 62 mmol/L

## 2019-06-04 LAB — CREATININE, URINE, RANDOM: Creatinine, Urine: 156.98 mg/dL

## 2019-06-04 MED ORDER — SODIUM CHLORIDE 0.9 % IV BOLUS
1000.0000 mL | Freq: Once | INTRAVENOUS | Status: AC
  Administered 2019-06-04: 1000 mL via INTRAVENOUS

## 2019-06-04 MED ORDER — HYDROMORPHONE BOLUS VIA INFUSION
1.0000 mg | Freq: Once | INTRAVENOUS | Status: AC
  Administered 2019-06-04: 1 mg via INTRAVENOUS
  Filled 2019-06-04: qty 1

## 2019-06-04 MED ORDER — ENOXAPARIN SODIUM 30 MG/0.3ML ~~LOC~~ SOLN: 30.0000 mg | SUBCUTANEOUS | Status: DC

## 2019-06-04 MED ORDER — SODIUM CHLORIDE 0.9 % IV SOLN
INTRAVENOUS | Status: DC
  Administered 2019-06-04: 11:00:00 via INTRAVENOUS

## 2019-06-04 NOTE — Progress Notes (Signed)
2 Days Post-Op open colostomy reversal and hernia repair Subjective: Pain better controlled.  Did well with therapy yesterday.    Objective: Vital signs in last 24 hours: Temp:  [98 F (36.7 C)-99.1 F (37.3 C)] 98 F (36.7 C) (10/09 0557) Pulse Rate:  [96-108] 105 (10/09 0557) Resp:  [13-20] 19 (10/09 0557) BP: (91-111)/(55-86) 111/86 (10/09 0557) SpO2:  [95 %-100 %] 99 % (10/09 0557) Weight:  [64.4 kg] 64.4 kg (10/09 0500)   Intake/Output from previous day: 10/08 0701 - 10/09 0700 In: 1466.2 [P.O.:120; I.V.:1246.2; IV Piggyback:100] Out: [id number] [Urine:700; Emesis/NG output:1650; Drains:145] Intake/Output this shift: No intake/output data recorded.   General appearance: alert Cardio: less tachy  Incision: no significant drainage  Lab Results:  Recent Labs    06/03/19 0324 06/04/19 0302  WBC 11.8* 16.2*  HGB 9.4* 8.4*  HCT 29.3* 25.7*  PLT 229 221   BMET Recent Labs    06/03/19 0324 06/04/19 0302  NA 140 143  K 4.8 3.7  CL 106 97*  CO2 22 29  GLUCOSE 131* 138*  BUN 22* 43*  CREATININE 1.96* 3.96*  CALCIUM 8.8* 8.6*   PT/INR No results for input(s): LABPROT, INR in the last 72 hours. ABG No results for input(s): PHART, HCO3 in the last 72 hours.  Invalid input(s): PCO2, PO2  MEDS, Scheduled . acetaminophen  1,000 mg Oral Q6H  . alvimopan  12 mg Oral BID  . Chlorhexidine Gluconate Cloth  6 each Topical Daily  . enoxaparin (LOVENOX) injection  40 mg Subcutaneous Q24H  . gabapentin  300 mg Oral BID  . HYDROmorphone   Intravenous Q4H  . levothyroxine  25 mcg Oral QAC breakfast  . lip balm  1 application Topical BID  . lisinopril  5 mg Oral Daily  . pantoprazole  80 mg Oral Daily  . saccharomyces boulardii  250 mg Oral BID  . vortioxetine HBr  5 mg Oral Daily    Studies/Results: No results found.  Assessment: s/p Procedure(s): OPEN COLOSTOMY REVERSAL lysis of adhesions,drainage intraperitoneal abcess small bowel resection and scar revision,  componentseparation hernia repair with phasix mesh Patient Active Problem List   Diagnosis Date Noted  . Hypothyroidism 06/03/2019  . Chronic pain syndrome 06/03/2019  . Incisional hernia s/p open component separation & repair w absorbable Phasix mesh 06/02/2019 06/03/2019  . Diverticulosis 03/2019  . Hair loss 01/06/2019  . Constipation 10/12/2018  . Intra-abdominal abscess ([place]) 10/10/2018  . Anemia due to chronic illness 10/01/2018  . Rectal perforation s/p partial colectomy/colostomy Jan 2020 08/27/2018  . Hx SBO 07/2018  . Impingement syndrome of right shoulder region 11/10/2017  . Vaginal atrophy 07/05/2016  . Acromioclavicular joint arthritis 12/25/2015  . Cervical spine degeneration 06/19/2015  . Full thickness rotator cuff tear 01/06/2015  . GERD (gastroesophageal reflux disease) 11/07/2014  . Hearing loss 03/04/2013  . Benign essential HTN 09/13/2011  . History of colonic polyps 09/13/2011  . Tobacco use disorder 07/10/2011  . Long term (current) use of opiate analgesic 06/04/2011  . Chronic neck pain 06/04/2011  . Depression 07/17/2009  . Rotator cuff syndrome of right shoulder 06/29/2009    Expected post op pain  Plan: Cont NG and NPO   Cont PCA with basal dose for pain control.  Cont scheduled robaxin and prn valium Increased Cr and low UOP:  Bolus 0.9 NS 1L.  Increase MIVF's.  Check urine lytes.  Seems to be due to acute blood loss anemia and dehydration from bowel prep and NG output.  LOS: 2 days     .Rosario Adie, [facility], [place]   06/04/2019 8:08 AM

## 2019-06-04 NOTE — Progress Notes (Signed)
Patient in severe pain. Spoke with Dr Marcello Moores who gave an order for a bolus from PCA pump. Also, has yet to void after Foley catheter removal. Foley to be replaced if patient has not voided by 1400. Donne Hazel, RN

## 2019-06-04 NOTE — Evaluation (Signed)
Physical Therapy Evaluation Patient Details Name: AINO LANZILLO MRN: [id number] DOB: [birth year] Today's Date: 06/04/2019   History of Present Illness  53 year old female admitted for open colostomy reversal, drainage of abscess, SB resction and hernia repair.  H/O rectal perforation with colostomy, HTN, depression, ARDS, acute renal failure,   Clinical Impression  Pt admitted with above diagnosis.  Pt currently with functional limitations due to the deficits listed below (see PT Problem List). Pt will benefit from skilled PT to increase their independence and safety with mobility to allow discharge to the venue listed below.  Pt initially hesitant to participate with PT stating "I had a poop, so I thought I got to rest today." Pt educated on importance of mobility.  She reluctantly walked with PT down the hallway with RW with high impulsivity noted.  She did not want to sit up long out of bed and was encouraged for her recovery.  Anticipate continued progress and recommend HHPT and RW.  If she does not progress or continues to have decreased safety, may need to consider SNF.     Follow Up Recommendations Home health PT    Equipment Recommendations  Rolling walker with 5" wheels    Recommendations for Other Services       Precautions / Restrictions Precautions Precautions: Fall;Other (comment)(impulsive) Precaution Comments: multiple lines, NG tube, JP drain, catheter, wound vac; abd binder Restrictions Weight Bearing Restrictions: No      Mobility  Bed Mobility Overal bed mobility: Needs Assistance Bed Mobility: Supine to Sit;Sidelying to Sit   Sidelying to sit: Min assist;+2 for safety/equipment       General bed mobility comments: Pt moving quickly and midway through transfer, she did roll to side and push up to get to EOB. MIN of 2 with use of bed pads to get to EOB.  Transfers Overall transfer level: Needs assistance Equipment used: Rolling walker (2 wheeled) Transfers:  Sit to/from Stand Sit to Stand: Min guard         General transfer comment: Pt standing up abruptly with poor safety.  Ambulation/Gait Ambulation/Gait assistance: Min assist;+2 safety/equipment;+2 physical assistance Gait Distance (Feet): 200 Feet Assistive device: Rolling walker (2 wheeled) Gait Pattern/deviations: Trunk flexed;Drifts right/left     General Gait Details: Pt drifting at times and impulsive with gait and direction at times. Pt not receptive to suggestions by PT.  Stairs            Wheelchair Mobility    Modified Rankin (Stroke Patients Only)       Balance Overall balance assessment: Needs assistance Sitting-balance support: Feet supported Sitting balance-Leahy Scale: Good       Standing balance-Leahy Scale: Poor Standing balance comment: requires UE support                             Pertinent Vitals/Pain Pain Assessment: Faces Faces Pain Scale: Hurts even more Pain Location: abdomen Pain Descriptors / Indicators: Aching;Grimacing Pain Intervention(s): Limited activity within patient's tolerance;Monitored during session;PCA encouraged    Home Living Family/patient expects to be discharged to:: Private residence Living Arrangements: Alone   Type of Home: Mobile home Home Access: (P) Stairs to enter Entrance Stairs-Rails: Right Entrance Stairs-Number of Steps: 1 Home Layout: One level Home Equipment: None      Prior Function                 Hand Dominance  Extremity/Trunk Assessment   Upper Extremity Assessment Upper Extremity Assessment: Defer to OT evaluation    Lower Extremity Assessment Lower Extremity Assessment: Generalized weakness       Communication      Cognition Arousal/Alertness: Awake/alert Behavior During Therapy: Anxious;Impulsive                                   General Comments: decreased safety and very impulsive despite cues to slow down due to lines.  Pt  tends to direct session and quickly.      General Comments General comments (skin integrity, edema, etc.): Pt resistive to gait initially and after she did walk, wanted to get back in bed after sitting just a few minutes.  Encouraged to sit OOB.    Exercises     Assessment/Plan    PT Assessment Patient needs continued PT services  PT Problem List Decreased mobility;Decreased balance;Decreased knowledge of use of DME;Decreased safety awareness       PT Treatment Interventions Gait training;DME instruction;Functional mobility training;Therapeutic activities;Therapeutic exercise;Balance training;Neuromuscular re-education    PT Goals (Current goals can be found in the Care Plan section)  Acute Rehab PT Goals Patient Stated Goal: none stated PT Goal Formulation: With patient Time For Goal Achievement: 06/18/19 Potential to Achieve Goals: Good    Frequency Min 3X/week   Barriers to discharge        Co-evaluation               AM-PAC PT "6 Clicks" Mobility  Outcome Measure Help needed turning from your back to your side while in a flat bed without using bedrails?: A Little Help needed moving from lying on your back to sitting on the side of a flat bed without using bedrails?: A Little Help needed moving to and from a bed to a chair (including a wheelchair)?: A Little Help needed standing up from a chair using your arms (e.g., wheelchair or bedside chair)?: A Little Help needed to walk in hospital room?: A Little Help needed climbing 3-5 steps with a railing? : A Little 6 Click Score: 18    End of Session   Activity Tolerance: Patient tolerated treatment well Patient left: in chair;with call bell/phone within reach;with chair alarm set Nurse Communication: Mobility status PT Visit Diagnosis: Difficulty in walking, not elsewhere classified (R26.2);Muscle weakness (generalized) (M62.81)    Time: [id number] PT Time Calculation (min) (ACUTE ONLY): 21 min   Charges:    PT Evaluation $PT Eval Moderate Complexity: 1 Mod          Verline Kong L. Tamala Julian, [place] Pager [phone] 06/04/2019   Galen Manila 06/04/2019, 1:12 PM

## 2019-06-04 NOTE — Care Management Important Message (Signed)
Important Message  Patient Details IM Letter given to Velva Harman RN to present to the Patient Name: Julie Williams MRN: [id number] Date of Birth: 1966/05/21   Medicare Important Message Given:  Yes     Kerin Salen 06/04/2019, 9:37 AM

## 2019-06-04 NOTE — Progress Notes (Signed)
Patient reports her pain "is a twelve" on the scale. Also, patient has not voided since Foley removed. Patient is receiving basal and bolus Dilaudid on her PCA.  Dr Marcello Moores notified and ordered received. Donne Hazel, RN

## 2019-06-05 LAB — CBC
HCT: 19.6 % — ABNORMAL LOW (ref 36.0–46.0)
Hemoglobin: 6.2 g/dL — CL (ref 12.0–15.0)
MCH: 33.9 pg (ref 26.0–34.0)
MCHC: 31.6 g/dL (ref 30.0–36.0)
MCV: 107.1 fL — ABNORMAL HIGH (ref 80.0–100.0)
Platelets: 192 10*3/uL (ref 150–400)
RBC: 1.83 MIL/uL — ABNORMAL LOW (ref 3.87–5.11)
RDW: 12.1 % (ref 11.5–15.5)
WBC: 10.5 10*3/uL (ref 4.0–10.5)
nRBC: 0 % (ref 0.0–0.2)

## 2019-06-05 LAB — BASIC METABOLIC PANEL
Anion gap: 10 (ref 5–15)
BUN: 28 mg/dL — ABNORMAL HIGH (ref 6–20)
CO2: 29 mmol/L (ref 22–32)
Calcium: 8.2 mg/dL — ABNORMAL LOW (ref 8.9–10.3)
Chloride: 105 mmol/L (ref 98–111)
Creatinine, Ser: 0.91 mg/dL (ref 0.44–1.00)
GFR calc Af Amer: >60 mL/min (ref >60)
GFR calc non Af Amer: >60 mL/min (ref >60)
Glucose, Bld: 96 mg/dL (ref 70–99)
Potassium: 3.4 mmol/L — ABNORMAL LOW (ref 3.5–5.1)
Sodium: 144 mmol/L (ref 135–145)

## 2019-06-05 LAB — PREPARE RBC (CROSSMATCH)

## 2019-06-05 MED ORDER — HYDROMORPHONE BOLUS VIA INFUSION
0.5000 mg | INTRAVENOUS | Status: DC | PRN
  Filled 2019-06-05: qty 1

## 2019-06-05 MED ORDER — ENOXAPARIN SODIUM 40 MG/0.4ML ~~LOC~~ SOLN
40.0000 mg | SUBCUTANEOUS | Status: DC
  Administered 2019-06-06 – 2019-06-11 (×4): 40 mg via SUBCUTANEOUS
  Filled 2019-06-05 (×7): qty 0.4

## 2019-06-05 NOTE — Progress Notes (Signed)
3 Days Post-Op open colostomy reversal and hernia repair Subjective: Pain better controlled.  Did well with therapy yesterday.  Ambulated in hall.  Had another BM  Objective: Vital signs in last 24 hours: Temp:  [98.1 F (36.7 C)-99 F (37.2 C)] 98.7 F (37.1 C) (10/10 0611) Pulse Rate:  [97-117] 99 (10/10 0611) Resp:  [12-20] 16 (10/10 0611) BP: (87-107)/(57-71) 107/71 (10/10 0611) SpO2:  [96 %-100 %] 99 % (10/10 0611) Weight:  [65.7 kg] 65.7 kg (10/10 0654)   Intake/Output from previous day: 10/09 0701 - 10/10 0700 In: 2696.2 [P.O.:400; I.V.:2296.2] Out: 1360 [Urine:500; Emesis/NG output:750; Drains:110] Intake/Output this shift: No intake/output data recorded.   General appearance: alert Cardio: less tachy NG: clear, output decreased Incision: no significant drainage  Lab Results:  Recent Labs    06/04/19 0302 06/05/19 0425  WBC 16.2* 10.5  HGB 8.4* 6.2*  HCT 25.7* 19.6*  PLT 221 192   BMET Recent Labs    06/04/19 0302 06/05/19 0425  NA 143 144  K 3.7 3.4*  CL 97* 105  CO2 29 29  GLUCOSE 138* 96  BUN 43* 28*  CREATININE 3.96* 0.91  CALCIUM 8.6* 8.2*   PT/INR No results for input(s): LABPROT, INR in the last 72 hours. ABG No results for input(s): PHART, HCO3 in the last 72 hours.  Invalid input(s): PCO2, PO2  MEDS, Scheduled . acetaminophen  1,000 mg Oral Q6H  . alvimopan  12 mg Oral BID  . Chlorhexidine Gluconate Cloth  6 each Topical Daily  . enoxaparin (LOVENOX) injection  30 mg Subcutaneous Q24H  . gabapentin  300 mg Oral BID  . HYDROmorphone   Intravenous Q4H  . levothyroxine  25 mcg Oral QAC breakfast  . lip balm  1 application Topical BID  . pantoprazole  80 mg Oral Daily  . saccharomyces boulardii  250 mg Oral BID  . vortioxetine HBr  5 mg Oral Daily    Studies/Results: No results found.  Assessment: s/p Procedure(s): OPEN COLOSTOMY REVERSAL lysis of adhesions,drainage intraperitoneal abcess small bowel resection and scar  revision, componentseparation hernia repair with phasix mesh Patient Active Problem List   Diagnosis Date Noted  . Hypothyroidism 06/03/2019  . Chronic pain syndrome 06/03/2019  . Incisional hernia s/p open component separation & repair w absorbable Phasix mesh 06/02/2019 06/03/2019  . Diverticulosis 03/2019  . Hair loss 01/06/2019  . Constipation 10/12/2018  . Intra-abdominal abscess ([place]) 10/10/2018  . Anemia due to chronic illness 10/01/2018  . Rectal perforation s/p partial colectomy/colostomy Jan 2020 08/27/2018  . Hx SBO 07/2018  . Impingement syndrome of right shoulder region 11/10/2017  . Vaginal atrophy 07/05/2016  . Acromioclavicular joint arthritis 12/25/2015  . Cervical spine degeneration 06/19/2015  . Full thickness rotator cuff tear 01/06/2015  . GERD (gastroesophageal reflux disease) 11/07/2014  . Hearing loss 03/04/2013  . Benign essential HTN 09/13/2011  . History of colonic polyps 09/13/2011  . Tobacco use disorder 07/10/2011  . Long term (current) use of opiate analgesic 06/04/2011  . Chronic neck pain 06/04/2011  . Depression 07/17/2009  . Rotator cuff syndrome of right shoulder 06/29/2009    Expected post op pain  Plan: Pt seems to have returning bowel function: D/C NG and start sips of clears   Cont PCA with basal dose for pain control today.  Cont scheduled robaxin and prn valium.  Will try getting back to PO baseline meds if she continues to do well  ARF:  Resolved, cont IVF's for today.  urine lytes  showed intrinsic dysfunction (possibly ATN), lisinopril held.  Seems to have been due to acute blood loss anemia and dehydration from bowel prep and NG output.    Acute blood loss anemia more apparent now that pt is hydrated.  Will transfuse 1 unit RBC for hgb 6.2   LOS: 3 days     .Rosario Adie, MD [facility], [place]   06/05/2019 8:06 AM

## 2019-06-05 NOTE — Progress Notes (Signed)
Pharmacy Brief Note - Alvimopan (Entereg)  The standing order set for alvimopan (Entereg) now includes an automatic order to discontinue the drug after the patient has had a bowel movement.  The change was approved by the [facility] and the Medical Executive Committee.    This patient has had a bowel movement documented by nursing.  Therefore, alvimopan has been discontinued.  If there are questions, please contact the pharmacy at [phone].  Thank you- Netta Cedars, [facility] 06/05/2019 9:39 AM

## 2019-06-05 NOTE — Progress Notes (Signed)
Lab called with critical hgb of 6.2. MD on call has been notified per paging system. Pt is stable and currently being monitored. Dawson Bills, RN

## 2019-06-06 LAB — CBC
HCT: 22 % — ABNORMAL LOW (ref 36.0–46.0)
Hemoglobin: 7 g/dL — ABNORMAL LOW (ref 12.0–15.0)
MCH: 33 pg (ref 26.0–34.0)
MCHC: 31.8 g/dL (ref 30.0–36.0)
MCV: 103.8 fL — ABNORMAL HIGH (ref 80.0–100.0)
Platelets: 232 10*3/uL (ref 150–400)
RBC: 2.12 MIL/uL — ABNORMAL LOW (ref 3.87–5.11)
RDW: 14.6 % (ref 11.5–15.5)
WBC: 6.2 10*3/uL (ref 4.0–10.5)
nRBC: 0 % (ref 0.0–0.2)

## 2019-06-06 LAB — BASIC METABOLIC PANEL
Anion gap: 8 (ref 5–15)
BUN: 12 mg/dL (ref 6–20)
CO2: 28 mmol/L (ref 22–32)
Calcium: 8.4 mg/dL — ABNORMAL LOW (ref 8.9–10.3)
Chloride: 106 mmol/L (ref 98–111)
Creatinine, Ser: 0.69 mg/dL (ref 0.44–1.00)
GFR calc Af Amer: >60 mL/min (ref >60)
GFR calc non Af Amer: >60 mL/min (ref >60)
Glucose, Bld: 95 mg/dL (ref 70–99)
Potassium: 3.2 mmol/L — ABNORMAL LOW (ref 3.5–5.1)
Sodium: 142 mmol/L (ref 135–145)

## 2019-06-06 MED ORDER — MORPHINE SULFATE 15 MG PO TABS
30.0000 mg | ORAL_TABLET | Freq: Four times a day (QID) | ORAL | Status: DC | PRN
  Administered 2019-06-06 – 2019-06-08 (×7): 30 mg via ORAL
  Filled 2019-06-06 (×7): qty 2

## 2019-06-06 MED ORDER — MORPHINE SULFATE 15 MG PO TABS: 30.0000 mg | ORAL_TABLET | Freq: Four times a day (QID) | ORAL | Status: DC | PRN

## 2019-06-06 MED ORDER — HYDROMORPHONE HCL 1 MG/ML IJ SOLN
0.5000 mg | INTRAMUSCULAR | Status: DC | PRN
  Administered 2019-06-06 – 2019-06-10 (×24): 1 mg via INTRAVENOUS
  Filled 2019-06-06 (×25): qty 1

## 2019-06-06 NOTE — Progress Notes (Signed)
Pt resting. No distress. PCA discontinued.

## 2019-06-07 LAB — TYPE AND SCREEN
ABO/RH(D): B POS
Antibody Screen: NEGATIVE
Unit division: 0
Unit division: 0

## 2019-06-07 LAB — BPAM RBC
Blood Product Expiration Date: 202010282359
Blood Product Expiration Date: 202010282359
ISSUE DATE / TIME: 202010100832
Unit Type and Rh: 7300
Unit Type and Rh: 7300

## 2019-06-07 MED ORDER — HYDROMORPHONE HCL 1 MG/ML IJ SOLN
1.0000 mg | Freq: Once | INTRAMUSCULAR | Status: AC
  Administered 2019-06-07: 06:00:00 1 mg via INTRAMUSCULAR

## 2019-06-07 NOTE — Progress Notes (Signed)
5 Days Post-Op open colostomy reversal and hernia repair Subjective: Had some IV and pain issues last night.   Ambulated in hall.  Having BM's and tolerating clears  Objective: Vital signs in last 24 hours: Temp:  [98.6 F (37 C)-99.4 F (37.4 C)] 98.6 F (37 C) (10/11 2140) Pulse Rate:  [86-99] 99 (10/12 0542) Resp:  [11-18] 18 (10/12 0542) BP: (119-125)/(81-91) 125/89 (10/12 0542) SpO2:  [95 %-100 %] 95 % (10/12 0542) Weight:  [62.7 kg] 62.7 kg (10/12 0500)   Intake/Output from previous day: 10/11 0701 - 10/12 0700 In: 600 [P.O.:600] Out: 45 [Drains:45] Intake/Output this shift: No intake/output data recorded.   General appearance: alert Cardio: RRR Incision: no significant drainage  Lab Results:  Recent Labs    06/05/19 0425 06/06/19 0558  WBC 10.5 6.2  HGB 6.2* 7.0*  HCT 19.6* 22.0*  PLT 192 232   BMET Recent Labs    06/05/19 0425 06/06/19 0558  NA 144 142  K 3.4* 3.2*  CL 105 106  CO2 29 28  GLUCOSE 96 95  BUN 28* 12  CREATININE 0.91 0.69  CALCIUM 8.2* 8.4*   PT/INR No results for input(s): LABPROT, INR in the last 72 hours. ABG No results for input(s): PHART, HCO3 in the last 72 hours.  Invalid input(s): PCO2, PO2  MEDS, Scheduled . acetaminophen  1,000 mg Oral Q6H  . Chlorhexidine Gluconate Cloth  6 each Topical Daily  . enoxaparin (LOVENOX) injection  40 mg Subcutaneous Q24H  . gabapentin  300 mg Oral BID  . levothyroxine  25 mcg Oral QAC breakfast  . lip balm  1 application Topical BID  . pantoprazole  80 mg Oral Daily  . saccharomyces boulardii  250 mg Oral BID  . vortioxetine HBr  5 mg Oral Daily    Studies/Results: No results found.  Assessment: s/p Procedure(s): OPEN COLOSTOMY REVERSAL lysis of adhesions,drainage intraperitoneal abcess small bowel resection and scar revision, componentseparation hernia repair with phasix mesh Patient Active Problem List   Diagnosis Date Noted  . Hypothyroidism 06/03/2019  . Chronic pain  syndrome 06/03/2019  . Incisional hernia s/p open component separation & repair w absorbable Phasix mesh 06/02/2019 06/03/2019  . Diverticulosis 03/2019  . Hair loss 01/06/2019  . Constipation 10/12/2018  . Intra-abdominal abscess ([place]) 10/10/2018  . Anemia due to chronic illness 10/01/2018  . Rectal perforation s/p partial colectomy/colostomy Jan 2020 08/27/2018  . Hx SBO 07/2018  . Impingement syndrome of right shoulder region 11/10/2017  . Vaginal atrophy 07/05/2016  . Acromioclavicular joint arthritis 12/25/2015  . Cervical spine degeneration 06/19/2015  . Full thickness rotator cuff tear 01/06/2015  . GERD (gastroesophageal reflux disease) 11/07/2014  . Hearing loss 03/04/2013  . Benign essential HTN 09/13/2011  . History of colonic polyps 09/13/2011  . Tobacco use disorder 07/10/2011  . Long term (current) use of opiate analgesic 06/04/2011  . Chronic neck pain 06/04/2011  . Depression 07/17/2009  . Rotator cuff syndrome of right shoulder 06/29/2009    Expected post op pain  Plan: Pt seems to have returning bowel function: advance to soft diet  Cont home narcotics and PRN dilaudid  ARF:  Resolved, urinating well per pt.  Floor staff needs to record output better    Acute blood loss anemia better after transfusion, recheck if pt becomes more tachycardic   LOS: 5 days     .Rosario Adie, [facility], [place]   06/07/2019 7:27 AM

## 2019-06-07 NOTE — Progress Notes (Addendum)
PRN pain medication given at 0035 and 0049 (via IV), on reassessment patient was asleep   Around 0336 patient asleep  Patient complains of pain and is frustrated because of pain around 0430,  IV team consult was placed after two attempts from writer (patient made aware of consult and that other PRN medications were not available at this time besides Dilaudid and patient was okay with waiting for access for a while).  IV team Aowyn RN at bedside when patient refused for writer to remove old IV site, patient began removing old IV site herself and Probation officer assessed skin afterward and covered site with guaze.  On call MD paged about patients pain of 10, and that patient had no IV access at the time (previous IV occluded). 0539) New orders obtained to give PRN medication via IM.   IM injection of PRN medication explained to patient. Dressing change was complete as well.   On pain reassessment patient stated pain had decreased and was calm.   Norlene Duel RN, BSN

## 2019-06-07 NOTE — Progress Notes (Signed)
5 Days Post-Op open colostomy reversal and hernia repair Subjective: Pain better controlled.    Ambulated in hall.  Had another BM.  Tolerated NG clamping  Objective: Vital signs in last 24 hours: Temp:  [98.6 F (37 C)-99.4 F (37.4 C)] 98.6 F (37 C) (10/11 2140) Pulse Rate:  [86-99] 99 (10/12 0542) Resp:  [11-18] 18 (10/12 0542) BP: (119-125)/(81-91) 125/89 (10/12 0542) SpO2:  [95 %-100 %] 95 % (10/12 0542) Weight:  [62.7 kg] 62.7 kg (10/12 0500)   Intake/Output from previous day: 10/11 0701 - 10/12 0700 In: 600 [P.O.:600] Out: 45 [Drains:45] Intake/Output this shift: No intake/output data recorded.   General appearance: alert Cardio: less tachy Incision: no significant drainage  Lab Results:  Recent Labs    06/05/19 0425 06/06/19 0558  WBC 10.5 6.2  HGB 6.2* 7.0*  HCT 19.6* 22.0*  PLT 192 232   BMET Recent Labs    06/05/19 0425 06/06/19 0558  NA 144 142  K 3.4* 3.2*  CL 105 106  CO2 29 28  GLUCOSE 96 95  BUN 28* 12  CREATININE 0.91 0.69  CALCIUM 8.2* 8.4*   PT/INR No results for input(s): LABPROT, INR in the last 72 hours. ABG No results for input(s): PHART, HCO3 in the last 72 hours.  Invalid input(s): PCO2, PO2  MEDS, Scheduled . acetaminophen  1,000 mg Oral Q6H  . Chlorhexidine Gluconate Cloth  6 each Topical Daily  . enoxaparin (LOVENOX) injection  40 mg Subcutaneous Q24H  . gabapentin  300 mg Oral BID  . levothyroxine  25 mcg Oral QAC breakfast  . lip balm  1 application Topical BID  . pantoprazole  80 mg Oral Daily  . saccharomyces boulardii  250 mg Oral BID  . vortioxetine HBr  5 mg Oral Daily    Studies/Results: No results found.  Assessment: s/p Procedure(s): OPEN COLOSTOMY REVERSAL lysis of adhesions,drainage intraperitoneal abcess small bowel resection and scar revision, componentseparation hernia repair with phasix mesh Patient Active Problem List   Diagnosis Date Noted  . Hypothyroidism 06/03/2019  . Chronic pain  syndrome 06/03/2019  . Incisional hernia s/p open component separation & repair w absorbable Phasix mesh 06/02/2019 06/03/2019  . Diverticulosis 03/2019  . Hair loss 01/06/2019  . Constipation 10/12/2018  . Intra-abdominal abscess ([place]) 10/10/2018  . Anemia due to chronic illness 10/01/2018  . Rectal perforation s/p partial colectomy/colostomy Jan 2020 08/27/2018  . Hx SBO 07/2018  . Impingement syndrome of right shoulder region 11/10/2017  . Vaginal atrophy 07/05/2016  . Acromioclavicular joint arthritis 12/25/2015  . Cervical spine degeneration 06/19/2015  . Full thickness rotator cuff tear 01/06/2015  . GERD (gastroesophageal reflux disease) 11/07/2014  . Hearing loss 03/04/2013  . Benign essential HTN 09/13/2011  . History of colonic polyps 09/13/2011  . Tobacco use disorder 07/10/2011  . Long term (current) use of opiate analgesic 06/04/2011  . Chronic neck pain 06/04/2011  . Depression 07/17/2009  . Rotator cuff syndrome of right shoulder 06/29/2009    Expected post op pain  Plan: Pt seems to have returning bowel function: clears  D/c PCA.  IV dilaudid PRN.  Cont scheduled robaxin and prn valium.  Will try getting back to PO baseline meds   ARF:  Resolved, SL IVF's.  urine lytes showed intrinsic dysfunction (possibly ATN), lisinopril held.     Acute blood loss anemia more apparent now that pt is hydrated.  Pt responded appropriately to 1 unit transfusion  LOS: 5 days     .Gifford Shave  Marcello Moores, [facility], [place] [phone]   06/07/2019 7:30 AM

## 2019-06-07 NOTE — Progress Notes (Addendum)
Occupational Therapy Treatment Patient Details Name: Julie Williams MRN: [id number] DOB: 07-25-1966 Today's Date: 06/07/2019    History of present illness 53 year old female admitted for open colostomy reversal, drainage of abscess, SB resction and hernia repair.  H/O rectal perforation with colostomy, HTN, depression, ARDS, acute renal failure,    OT comments  Pt seen for OT this date and noted to be able to perform all aspects of self care at I or MOD I level with exception of one verbal cue for safety in prep for fxl mobility to pin JP drain to gown prior to initiating ambulation for safe line management. Pt with no c/o pain and manages pole with wound vac during demo of all ADLs including commode transfer, standing grooming at sink-side, sit<>stand during LB dressing at MOD I level. OT updates care plan and notes that pt has met all goals with the exception of not requiring any safety cues. Pt with some lingering impulsivity so supervision for safety at home is imperative upon d/c, could benefit from one more OT follow up to address safety, but otherwise able to perform self care tasks required in home environment.    Follow Up Recommendations  Supervision/Assistance - 24 hour    Equipment Recommendations  None recommended by OT    Recommendations for Other Services      Precautions / Restrictions Precautions Precautions: Fall;Other (comment) Precaution Comments: wound vac, abd binder, JP drain Restrictions Weight Bearing Restrictions: No       Mobility Bed Mobility Overal bed mobility: Modified Independent Bed Mobility: Supine to Sit;Sidelying to Sit   Sidelying to sit: Modified independent (Device/Increase time) Supine to sit: Modified independent (Device/Increase time)        Transfers Overall transfer level: Modified independent Equipment used: (pt states she has not been using walker for fxl mobility on unit in 3 days, demos G standing balance and no LOB with fxl  mobility with no AD) Transfers: Sit to/from Stand Sit to Stand: Modified independent (Device/Increase time);Supervision Stand pivot transfers: Modified independent (Device/Increase time);Supervision            Balance Overall balance assessment: Modified Independent Sitting-balance support: Feet supported Sitting balance-Leahy Scale: Normal     Standing balance support: Single extremity supported Standing balance-Leahy Scale: Good                             ADL either performed or assessed with clinical judgement   ADL       Grooming: Wash/dry hands;Modified independent;Standing Grooming Details (indicate cue type and reason): standing sinkside, manages pole with wound vac attached I'ly as needed to reach/locate ADL items. Upper Body Bathing: Modified independent;Sitting   Lower Body Bathing: Modified independent;Sit to/from stand   Upper Body Dressing : Independent   Lower Body Dressing: Modified independent;Sit to/from stand   Toilet Transfer: Modified Independent;Ambulation;Grab bars   Toileting- Clothing Manipulation and Hygiene: Modified independent;Sit to/from stand       Functional mobility during ADLs: Modified independent;Supervision/safety(pt able to unplug pole with wound vac attached, requires 1 verbal cue to pin JP drain to gown. Otherwise completes fxl mobility for full lap of unit with no hands on assistance.)       Vision Patient Visual Report: No change from baseline     Perception     Praxis      Cognition Arousal/Alertness: Awake/alert Behavior During Therapy: WFL for tasks assessed/performed Overall Cognitive Status: Within Functional Limits for  tasks assessed                                 General Comments: some minor impulsivity as pt attempts to perform fxl mobility without first pinning JP drain to prevent tension, given one cue and then able to proceed safely/appropriately with fxl tasks and mobility.         Exercises Other Exercises Other Exercises: OT facilitates education re: safety in regards to fxl mobility and line mgt. Pt verbalized understanding, demos understanding of pinning JP drain to gown prior to performing fxl mobility after OT provides one cue.   Shoulder Instructions       General Comments      Pertinent Vitals/ Pain       Pain Assessment: Faces Faces Pain Scale: Hurts a little bit Pain Location: abdomen with LB dressing while seated using cross-leg technique. Pain Descriptors / Indicators: Guarding Pain Intervention(s): Limited activity within patient's tolerance;Monitored during session  Home Living                                          Prior Functioning/Environment              Frequency  1x/week       Progress Toward Goals  OT Goals(current goals can now be found in the care plan section)  Progress towards OT goals: 5/6 goals met  Acute Rehab OT Goals Patient Stated Goal: none stated OT Goal Formulation: with patient Time for goal achievement: 06/17/2019 Potential to Achieve goals: Good  Plan Discharge plan needs to be updated    Co-evaluation                 AM-PAC OT "6 Clicks" Daily Activity     Outcome Measure   Help from another person eating meals?: None Help from another person taking care of personal grooming?: None Help from another person toileting, which includes using toliet, bedpan, or urinal?: None Help from another person bathing (including washing, rinsing, drying)?: A Little Help from another person to put on and taking off regular upper body clothing?: None Help from another person to put on and taking off regular lower body clothing?: None 6 Click Score: 23    End of Session Equipment Utilized During Treatment: Gait belt  OT Visit Diagnosis: Muscle weakness (generalized) (M62.81)   Activity Tolerance Patient tolerated treatment well   Patient Left in bed;with call bell/phone within  reach   Nurse Communication          Time: 0349-1791 OT Time Calculation (min): 24 min  Charges: OT General Charges $OT Visit: 1 Visit OT Treatments $Self Care/Home Management : 8-22 mins $Therapeutic Activity: 8-22 mins   Sharren Bridge, OTR/L 06/07/2019, 5:14 PM

## 2019-06-07 NOTE — Care Management Important Message (Signed)
Important Message  Patient Details IM Letter given to Velva Harman RN to present to the Patient Name: Julie Williams MRN: [id number] Date of Birth: September 22, 1965   Medicare Important Message Given:  Yes     Kerin Salen 06/07/2019, 10:46 AM

## 2019-06-08 MED ORDER — MORPHINE SULFATE 30 MG PO TABS
30.0000 mg | ORAL_TABLET | ORAL | Status: DC | PRN
  Administered 2019-06-08 – 2019-06-09 (×5): 30 mg via ORAL
  Filled 2019-06-08 (×6): qty 1

## 2019-06-08 NOTE — Progress Notes (Signed)
Pt wanting to go home tomorrow, wanted me to call the doctor and "get the ball rolling".  Called MD but she was not available this afternoon so I left a message with her nurse, Claiborne Billings.

## 2019-06-08 NOTE — Progress Notes (Signed)
6 Days Post-Op open colostomy reversal and hernia repair Subjective: Still using quite a bit of dilaudid for breakthrough.   Ambulated in hall.  Having BM's and tolerating soft diet  Objective: Vital signs in last 24 hours: Temp:  [97.7 F (36.5 C)-99 F (37.2 C)] 97.7 F (36.5 C) (10/13 0534) Pulse Rate:  [89-139] 92 (10/13 0534) Resp:  [16-20] 16 (10/13 0534) BP: (121-142)/(85-101) 121/87 (10/13 0534) SpO2:  [93 %-99 %] 93 % (10/13 0534)   Intake/Output from previous day: 10/12 0701 - 10/13 0700 In: 1320 [P.O.:1320] Out: 2528 [Urine:2450; Drains:75; Stool:3] Intake/Output this shift: No intake/output data recorded.   General appearance: alert Cardio: RRR Incision: no significant drainage, preveena vac in place  Lab Results:  Recent Labs    06/06/19 0558  WBC 6.2  HGB 7.0*  HCT 22.0*  PLT 232   BMET Recent Labs    06/06/19 0558  NA 142  K 3.2*  CL 106  CO2 28  GLUCOSE 95  BUN 12  CREATININE 0.69  CALCIUM 8.4*   PT/INR No results for input(s): LABPROT, INR in the last 72 hours. ABG No results for input(s): PHART, HCO3 in the last 72 hours.  Invalid input(s): PCO2, PO2  MEDS, Scheduled . acetaminophen  1,000 mg Oral Q6H  . Chlorhexidine Gluconate Cloth  6 each Topical Daily  . enoxaparin (LOVENOX) injection  40 mg Subcutaneous Q24H  . gabapentin  300 mg Oral BID  . levothyroxine  25 mcg Oral QAC breakfast  . lip balm  1 application Topical BID  . pantoprazole  80 mg Oral Daily  . saccharomyces boulardii  250 mg Oral BID  . vortioxetine HBr  5 mg Oral Daily    Studies/Results: No results found.  Assessment: s/p Procedure(s): OPEN COLOSTOMY REVERSAL lysis of adhesions,drainage intraperitoneal abcess small bowel resection and scar revision, componentseparation hernia repair with phasix mesh Patient Active Problem List   Diagnosis Date Noted  . Hypothyroidism 06/03/2019  . Chronic pain syndrome 06/03/2019  . Incisional hernia s/p open  component separation & repair w absorbable Phasix mesh 06/02/2019 06/03/2019  . Diverticulosis 03/2019  . Hair loss 01/06/2019  . Constipation 10/12/2018  . Intra-abdominal abscess ([place]) 10/10/2018  . Anemia due to chronic illness 10/01/2018  . Rectal perforation s/p partial colectomy/colostomy Jan 2020 08/27/2018  . Hx SBO 07/2018  . Impingement syndrome of right shoulder region 11/10/2017  . Vaginal atrophy 07/05/2016  . Acromioclavicular joint arthritis 12/25/2015  . Cervical spine degeneration 06/19/2015  . Full thickness rotator cuff tear 01/06/2015  . GERD (gastroesophageal reflux disease) 11/07/2014  . Hearing loss 03/04/2013  . Benign essential HTN 09/13/2011  . History of colonic polyps 09/13/2011  . Tobacco use disorder 07/10/2011  . Long term (current) use of opiate analgesic 06/04/2011  . Chronic neck pain 06/04/2011  . Depression 07/17/2009  . Rotator cuff syndrome of right shoulder 06/29/2009    Expected post op pain  Plan: Pt seems to have returning bowel function: advance to soft diet  Cont home narcotics and PRN dilaudid  ARF:  Resolved, urinating well per pt.  Floor staff needs to record output better    Acute blood loss anemia better after transfusion, will recheck hgb in AM  LOS: 6 days     .Rosario Adie, [facility], [place]   06/08/2019 8:24 AM

## 2019-06-08 NOTE — Progress Notes (Signed)
Physical Therapy Treatment Patient Details Name: Julie Williams MRN: [id number] DOB: [birth year] Today's Date: 06/08/2019    History of Present Illness 53 year old female admitted for open colostomy reversal, drainage of abscess, SB resction and hernia repair.  H/O rectal perforation with colostomy, HTN, depression, ARDS, acute renal failure,     PT Comments    Pt has made excellent progress with mobility. Pt is currently ambulating in the hospital room and hall mod Independently. Pt reports pain is her limiting factor. Pt has demonstrated good understanding of precautions and how to advance activity tolerance. Pt stated she is concerned over pain management, need for wound vac, and hospital charging for unnecessary items. Pt is safe to d/c home when medically ready and no follow-up PT is indicated. Pt is d/c from acute PT.  Follow Up Recommendations  No PT follow up     Equipment Recommendations  None recommended by PT    Recommendations for Other Services       Precautions / Restrictions Precautions Precaution Comments: wound vac, abd binder, JP drain Restrictions Weight Bearing Restrictions: No    Mobility  Bed Mobility Overal bed mobility: Modified Independent Bed Mobility: Supine to Sit   Sidelying to sit: Modified independent (Device/Increase time) Supine to sit: Modified independent (Device/Increase time)        Transfers Overall transfer level: Modified independent Equipment used: None   Sit to Stand: Modified independent (Device/Increase time) Stand pivot transfers: Modified independent (Device/Increase time)          Ambulation/Gait Ambulation/Gait assistance: Modified independent (Device/Increase time) Gait Distance (Feet): 225 Feet Assistive device: IV Pole Gait Pattern/deviations: Trunk flexed;Step-through pattern         Stairs             Wheelchair Mobility    Modified Rankin (Stroke Patients Only)       Balance            Standing balance support: No upper extremity supported Standing balance-Leahy Scale: Fair                              Cognition Arousal/Alertness: Awake/alert Behavior During Therapy: Agitated Overall Cognitive Status: Within Functional Limits for tasks assessed                                 General Comments: Pt concerned over d/c plans, not wanting wound vac, hospital bill/charges for "unnecessary items", and pain management.      Exercises      General Comments General comments (skin integrity, edema, etc.): Pt agitated with pain management, wanting to d/c, hospital bill/charges, and also feeling like she doesn/t need the wound vac. Listened to patient and attempt to reassure her.      Pertinent Vitals/Pain Pain Assessment: 0-10 Pain Score: 2  Pain Location: abdomen Pain Descriptors / Indicators: Discomfort Pain Intervention(s): Limited activity within patient's tolerance;Monitored during session;Premedicated before session    Home Living                      Prior Function            PT Goals (current goals can now be found in the care plan section) Progress towards PT goals: Goals met/education completed, patient discharged from PT    Frequency           PT Plan Other (  comment)(d/c-goals met)    Co-evaluation              AM-PAC PT "6 Clicks" Mobility   Outcome Measure  Help needed turning from your back to your side while in a flat bed without using bedrails?: None Help needed moving from lying on your back to sitting on the side of a flat bed without using bedrails?: None Help needed moving to and from a bed to a chair (including a wheelchair)?: None Help needed standing up from a chair using your arms (e.g., wheelchair or bedside chair)?: None Help needed to walk in hospital room?: None Help needed climbing 3-5 steps with a railing? : A Little 6 Click Score: 23    End of Session   Activity Tolerance:  Patient tolerated treatment well Patient left: in bed;with call bell/phone within reach Nurse Communication: Other (comment)(Pt is Independent) PT Visit Diagnosis: Difficulty in walking, not elsewhere classified (R26.2);Muscle weakness (generalized) (M62.81)     Time: 5053-9767 PT Time Calculation (min) (ACUTE ONLY): 27 min  Charges:  $Gait Training: 8-22 mins $Self Care/Home Management: [place], PT   [name] 06/08/2019, 9:45 AM

## 2019-06-08 NOTE — Progress Notes (Signed)
Physical Therapy Discharge Patient Details Name: Julie Williams MRN: [id number] DOB: August 25, 1966 Today's Date: 06/08/2019 Time: 3779-3968 PT Time Calculation (min) (ACUTE ONLY): 27 min  Patient discharged from PT services secondary to goals met and no further PT needs identified.  Please see latest therapy progress note for current level of functioning and progress toward goals.    Progress and discharge plan discussed with patient and/or caregiver: Patient/Caregiver agrees with plan  GP     [name] 06/08/2019, 9:50 AM

## 2019-06-09 LAB — CBC
HCT: 24.6 % — ABNORMAL LOW (ref 36.0–46.0)
Hemoglobin: 7.6 g/dL — ABNORMAL LOW (ref 12.0–15.0)
MCH: 32.8 pg (ref 26.0–34.0)
MCHC: 30.9 g/dL (ref 30.0–36.0)
MCV: 106 fL — ABNORMAL HIGH (ref 80.0–100.0)
Platelets: 482 10*3/uL — ABNORMAL HIGH (ref 150–400)
RBC: 2.32 MIL/uL — ABNORMAL LOW (ref 3.87–5.11)
RDW: 13.8 % (ref 11.5–15.5)
WBC: 8.9 10*3/uL (ref 4.0–10.5)
nRBC: 0 % (ref 0.0–0.2)

## 2019-06-09 LAB — BASIC METABOLIC PANEL
Anion gap: 11 (ref 5–15)
BUN: 8 mg/dL (ref 6–20)
CO2: 26 mmol/L (ref 22–32)
Calcium: 8.9 mg/dL (ref 8.9–10.3)
Chloride: 103 mmol/L (ref 98–111)
Creatinine, Ser: 0.66 mg/dL (ref 0.44–1.00)
GFR calc Af Amer: >60 mL/min (ref >60)
GFR calc non Af Amer: >60 mL/min (ref >60)
Glucose, Bld: 113 mg/dL — ABNORMAL HIGH (ref 70–99)
Potassium: 3.3 mmol/L — ABNORMAL LOW (ref 3.5–5.1)
Sodium: 140 mmol/L (ref 135–145)

## 2019-06-09 MED ORDER — MORPHINE SULFATE 30 MG PO TABS: 30.0000 mg | ORAL_TABLET | Freq: Four times a day (QID) | ORAL | Status: DC | PRN

## 2019-06-09 NOTE — Progress Notes (Signed)
7 Days Post-Op open colostomy reversal and hernia repair Subjective: Still using quite a bit of dilaudid for breakthrough.   Ambulated in hall.  Having BM's and tolerating soft diet  Objective: Vital signs in last 24 hours: Temp:  [98.5 F (36.9 C)-98.9 F (37.2 C)] 98.9 F (37.2 C) (10/14 0403) Pulse Rate:  [101-107] 101 (10/14 0403) Resp:  [17-20] 20 (10/14 0403) BP: (108-123)/(82-93) 108/82 (10/14 0403) SpO2:  [97 %-100 %] 97 % (10/14 0403) Weight:  [68.8 kg] 68.8 kg (10/14 0403)   Intake/Output from previous day: 10/13 0701 - 10/14 0700 In: 960 [P.O.:960] Out: 1803 [Urine:1700; Drains:102; Stool:1] Intake/Output this shift: No intake/output data recorded.   General appearance: alert Cardio: RRR Incision: no significant drainage, preveena vac in place  Lab Results:  Recent Labs    06/09/19 0404  WBC 8.9  HGB 7.6*  HCT 24.6*  PLT 482*   BMET Recent Labs    06/09/19 0404  NA 140  K 3.3*  CL 103  CO2 26  GLUCOSE 113*  BUN 8  CREATININE 0.66  CALCIUM 8.9   PT/INR No results for input(s): LABPROT, INR in the last 72 hours. ABG No results for input(s): PHART, HCO3 in the last 72 hours.  Invalid input(s): PCO2, PO2  MEDS, Scheduled . acetaminophen  1,000 mg Oral Q6H  . Chlorhexidine Gluconate Cloth  6 each Topical Daily  . enoxaparin (LOVENOX) injection  40 mg Subcutaneous Q24H  . gabapentin  300 mg Oral BID  . levothyroxine  25 mcg Oral QAC breakfast  . lip balm  1 application Topical BID  . pantoprazole  80 mg Oral Daily  . saccharomyces boulardii  250 mg Oral BID  . vortioxetine HBr  5 mg Oral Daily    Studies/Results: No results found.  Assessment: s/p Procedure(s): OPEN COLOSTOMY REVERSAL lysis of adhesions,drainage intraperitoneal abcess small bowel resection and scar revision, componentseparation hernia repair with phasix mesh Patient Active Problem List   Diagnosis Date Noted  . Hypothyroidism 06/03/2019  . Chronic pain syndrome  06/03/2019  . Incisional hernia s/p open component separation & repair w absorbable Phasix mesh 06/02/2019 06/03/2019  . Diverticulosis 03/2019  . Hair loss 01/06/2019  . Constipation 10/12/2018  . Intra-abdominal abscess ([place]) 10/10/2018  . Anemia due to chronic illness 10/01/2018  . Rectal perforation s/p partial colectomy/colostomy Jan 2020 08/27/2018  . Hx SBO 07/2018  . Impingement syndrome of right shoulder region 11/10/2017  . Vaginal atrophy 07/05/2016  . Acromioclavicular joint arthritis 12/25/2015  . Cervical spine degeneration 06/19/2015  . Full thickness rotator cuff tear 01/06/2015  . GERD (gastroesophageal reflux disease) 11/07/2014  . Hearing loss 03/04/2013  . Benign essential HTN 09/13/2011  . History of colonic polyps 09/13/2011  . Tobacco use disorder 07/10/2011  . Long term (current) use of opiate analgesic 06/04/2011  . Chronic neck pain 06/04/2011  . Depression 07/17/2009  . Rotator cuff syndrome of right shoulder 06/29/2009    Expected post op pain  Plan: Cont soft diet  Cont home narcotics and PRN dilaudid  ARF:  Resolved, urinating well per pt.  Floor staff needs to record output better    Acute blood loss anemia better after transfusion, hgb ok  LOS: 7 days     .Rosario Adie, [facility], [place]   06/09/2019 9:52 AM

## 2019-06-09 NOTE — Care Management Important Message (Signed)
Important Message  Patient Details  Name: Julie Williams MRN: [id number] Date of Birth: 10-19-1965   Medicare Important Message Given:  Yes. CMA printed out IM for the Case Management Nurse or CSW to give to the patient.      Cullin Dishman 06/09/2019, 8:24 AM

## 2019-06-09 NOTE — Progress Notes (Signed)
Pt. Notified RN about incident yesterday were she had a choking spell that led to her hearing a popping sensation in location where colostomy previously done. Since yesterday after lunch her pain has increased with intermittent occassions of indigestion and nausea. This am patient had an episode of vomiting. Pt. Requesting to have a CT scan to ensure "nothing new has occurred," MD notified. Notified MD earlier of patient desire to go home and to have temporary po pain med for breakthrough surgical pain. The MD responded pt. Will have to fu with pain med MD. RN sent secure chat to MD and will leave message with office as well.

## 2019-06-09 NOTE — Progress Notes (Signed)
Patient was informed to try to take PO meds over IV, patient has declined to take PO meds twice during shift, requested IV Dilaudid for pain instead. Dawson Bills, RN

## 2019-06-10 ENCOUNTER — Inpatient Hospital Stay (HOSPITAL_COMMUNITY): Payer: PPO

## 2019-06-10 MED ORDER — GUAIFENESIN 100 MG/5ML PO SOLN
5.0000 mL | ORAL | Status: DC | PRN
  Administered 2019-06-10 – 2019-06-11 (×3): 100 mg via ORAL
  Filled 2019-06-10 (×3): qty 10

## 2019-06-10 MED ORDER — HYDROMORPHONE HCL 1 MG/ML IJ SOLN
0.5000 mg | INTRAMUSCULAR | Status: DC | PRN
  Administered 2019-06-10 – 2019-06-11 (×4): 1 mg via INTRAVENOUS
  Filled 2019-06-10 (×5): qty 1

## 2019-06-10 MED ORDER — MORPHINE SULFATE 30 MG PO TABS
60.0000 mg | ORAL_TABLET | Freq: Four times a day (QID) | ORAL | Status: DC
  Administered 2019-06-10 – 2019-06-11 (×4): 60 mg via ORAL
  Filled 2019-06-10 (×4): qty 2

## 2019-06-10 MED ORDER — OXYCODONE HCL 5 MG PO TABS
10.0000 mg | ORAL_TABLET | ORAL | Status: DC | PRN
  Administered 2019-06-10 – 2019-06-11 (×3): 10 mg via ORAL
  Filled 2019-06-10 (×3): qty 2

## 2019-06-10 NOTE — Progress Notes (Signed)
8 Days Post-Op open colostomy reversal and hernia repair Subjective: Still using quite a bit of dilaudid for breakthrough.   Ambulated in hall.  Having BM's and tolerating soft diet.  C/o of nausea and spitting up  Objective: Vital signs in last 24 hours: Temp:  [97.6 F (36.4 C)-100 F (37.8 C)] 100 F (37.8 C) (10/15 0653) Pulse Rate:  [98-107] 107 (10/15 0653) Resp:  [17-18] 17 (10/15 0653) BP: (109-111)/(77-84) 109/77 (10/15 0653) SpO2:  [90 %-98 %] 98 % (10/15 0653) Weight:  [65.6 kg] 65.6 kg (10/15 0653)   Intake/Output from previous day: 10/14 0701 - 10/15 0700 In: 480 [P.O.:480] Out: 1030 [Urine:525; Emesis/NG output:20; Drains:85; Stool:400] Intake/Output this shift: Total I/O In: -  Out: 300 [Urine:300]   General appearance: alert Cardio: RRR Abd: distended Incision: no significant drainage, preveena vac in place  Lab Results:  Recent Labs    06/09/19 0404  WBC 8.9  HGB 7.6*  HCT 24.6*  PLT 482*   BMET Recent Labs    06/09/19 0404  NA 140  K 3.3*  CL 103  CO2 26  GLUCOSE 113*  BUN 8  CREATININE 0.66  CALCIUM 8.9   PT/INR No results for input(s): LABPROT, INR in the last 72 hours. ABG No results for input(s): PHART, HCO3 in the last 72 hours.  Invalid input(s): PCO2, PO2  MEDS, Scheduled . Chlorhexidine Gluconate Cloth  6 each Topical Daily  . enoxaparin (LOVENOX) injection  40 mg Subcutaneous Q24H  . gabapentin  300 mg Oral BID  . levothyroxine  25 mcg Oral QAC breakfast  . lip balm  1 application Topical BID  . morphine  60 mg Oral Q6H  . pantoprazole  80 mg Oral Daily  . saccharomyces boulardii  250 mg Oral BID  . vortioxetine HBr  5 mg Oral Daily    Studies/Results: No results found.  Assessment: s/p Procedure(s): OPEN COLOSTOMY REVERSAL lysis of adhesions,drainage intraperitoneal abcess small bowel resection and scar revision, componentseparation hernia repair with phasix mesh Patient Active Problem List   Diagnosis Date  Noted  . Hypothyroidism 06/03/2019  . Chronic pain syndrome 06/03/2019  . Incisional hernia s/p open component separation & repair w absorbable Phasix mesh 06/02/2019 06/03/2019  . Diverticulosis 03/2019  . Hair loss 01/06/2019  . Constipation 10/12/2018  . Intra-abdominal abscess ([place]) 10/10/2018  . Anemia due to chronic illness 10/01/2018  . Rectal perforation s/p partial colectomy/colostomy Jan 2020 08/27/2018  . Hx SBO 07/2018  . Impingement syndrome of right shoulder region 11/10/2017  . Vaginal atrophy 07/05/2016  . Acromioclavicular joint arthritis 12/25/2015  . Cervical spine degeneration 06/19/2015  . Full thickness rotator cuff tear 01/06/2015  . GERD (gastroesophageal reflux disease) 11/07/2014  . Hearing loss 03/04/2013  . Benign essential HTN 09/13/2011  . History of colonic polyps 09/13/2011  . Tobacco use disorder 07/10/2011  . Long term (current) use of opiate analgesic 06/04/2011  . Chronic neck pain 06/04/2011  . Depression 07/17/2009  . Rotator cuff syndrome of right shoulder 06/29/2009    Expected post op pain  Plan: Switch to reg diet  Cont home narcotics scheduled.  Oxycodone PRN.  Hopefully can come off the IV narcotics  ARF:  Resolved, urinating well per pt.  Floor staff needs to record output better    Acute blood loss anemia better after transfusion, hgb ok  LOS: 8 days     .Rosario Adie, [facility], [place]   06/10/2019 8:29 AM

## 2019-06-11 ENCOUNTER — Telehealth: Payer: Self-pay

## 2019-06-11 MED ORDER — POLYETHYLENE GLYCOL 3350 17 G PO PACK: 17.0000 g | PACK | Freq: Every day | ORAL | Status: DC

## 2019-06-11 MED ORDER — HYDROMORPHONE HCL 1 MG/ML IJ SOLN: 0.5000 mg | Freq: Three times a day (TID) | INTRAMUSCULAR | Status: DC | PRN

## 2019-06-11 MED ORDER — BISACODYL 10 MG RE SUPP: 10.0000 mg | Freq: Every day | RECTAL | Status: DC | PRN

## 2019-06-11 MED ORDER — POLYETHYLENE GLYCOL 3350 17 G PO PACK: 17.0000 g | PACK | Freq: Every day | ORAL | 0 refills | Status: DC

## 2019-06-11 NOTE — Plan of Care (Addendum)
Pt was discharged home today. Instructions were reviewed with patient, and questions were answered. Patient was advised to keep her appointment with DR Marcello Moores on Tuesday 10/20 for staple removal. Patient demonstrated knowledge of emptying and milking JP drain satisfactorily.  Pt was taken to main entrance via wheelchair by NT.

## 2019-06-11 NOTE — Discharge Summary (Signed)
Physician Discharge Summary  Patient ID: Julie Williams MRN: [id number] DOB/AGE: 1966/07/20 53 y.o.  Admit date: 06/02/2019 Discharge date: 06/11/2019  Admission Diagnoses: Colostomy, incisional hernia  Discharge Diagnoses:  Principal Problem:   Incisional hernia s/p open component separation & repair w absorbable Phasix mesh 06/02/2019 Active Problems:   Depression   Long term (current) use of opiate analgesic   Benign essential HTN   History of colonic polyps   GERD (gastroesophageal reflux disease)   Chronic neck pain   Rectal perforation s/p partial colectomy/colostomy Jan 2020   Intra-abdominal abscess (HCC)   Constipation   Hx SBO   Diverticulosis   Hypothyroidism   Chronic pain syndrome   Discharged Condition: stable  Hospital Course: Pt was admitted after surgery.  She had a significantly difficult time with pain control due to her chronic narcotic use.  Her diet was advanced once bowel function returned.  By POD 9 she was requesting to be discharged.  She was having bowel function and tolerating a diet.  She is aware that I cannot prescribe her additional narcotics and that she will most likely be in significant pain without the medications she is using in the hospital.  I have sent a message to her prescribing MD as well.  Consults: None  Significant Diagnostic Studies: labs: cbc, bmet  Treatments: analgesia: Dilaudid and surgery: open colostomy reversal, small bowel resection and incisional hernia repair.    Discharge Exam: Blood pressure 101/69, pulse 87, temperature 97.8 F (36.6 C), temperature source Oral, resp. rate 16, weight 65 kg, SpO2 92 %. General appearance: alert and cooperative GI: normal findings: soft, mildly distended Incision/Wound: clean  Disposition: Discharge disposition: 01-Home or Self Care        Allergies as of 06/11/2019      Reactions   Bee Venom Swelling      Medication List    TAKE these medications   HAIR/SKIN/NAILS  PO Take 1 tablet by mouth daily.   levothyroxine 25 MCG tablet Commonly known as: SYNTHROID Take 25 mcg by mouth daily before breakfast.   lisinopril 5 MG tablet Commonly known as: ZESTRIL Take 1 tablet (5 mg total) by mouth daily.   morphine 30 MG tablet Commonly known as: MSIR Take 1 tablet (30 mg total) by mouth every 6 (six) hours as needed for severe pain.   multivitamin with minerals Tabs tablet Take 1 tablet by mouth daily.   omeprazole 40 MG capsule Commonly known as: PRILOSEC Take 1 capsule (40 mg total) by mouth daily.   polyethylene glycol 17 g packet Commonly known as: MIRALAX / GLYCOLAX Take 17 g by mouth daily.   QUEtiapine 25 MG tablet Commonly known as: SEROQUEL Take 1 tablet (25 mg total) by mouth at bedtime.   vortioxetine HBr 5 MG Tabs tablet Commonly known as: Trintellix Take 1 tablet (5 mg total) by mouth daily.      Follow-up Information    Leighton Ruff, MD. Schedule an appointment as soon as possible for a visit in 2 week(s).   Specialty: General Surgery Contact information: [address] [place] [id number] [phone]           Signed: Rosario Adie [id number], 10:53 AM

## 2019-06-11 NOTE — Telephone Encounter (Signed)
Attempted to call the pt multiple times regarding her pain medications with no answer. If pt calls again over the weekend, I would increase pt's chronic opioid to morphine 60mg  q6h for the next 5 days, and have her follow-up in clinic next week to address her pain management in person.   Attempts to call Dr. Manon Hilding office at the given number [phone]) require an extension before the line hangs up. Will send her a staff message this evening regarding the pt's need for additional postoperative opioids.

## 2019-06-11 NOTE — Telephone Encounter (Signed)
Requesting to speak with a nurse about something, please call pt back.  

## 2019-06-11 NOTE — Progress Notes (Signed)
9 Days Post-Op open colostomy reversal and hernia repair Subjective: using less dilaudid for breakthrough.   Ambulated in hall.  Hasn't had a BM in a couple of days.  Passing flatus    Objective: Vital signs in last 24 hours: Temp:  [97.8 F (36.6 C)-98.7 F (37.1 C)] 97.8 F (36.6 C) (10/16 0644) Pulse Rate:  [87-103] 87 (10/16 0644) Resp:  [16-18] 16 (10/16 0644) BP: (90-110)/(69-80) 101/69 (10/16 0644) SpO2:  [92 %-97 %] 92 % (10/16 0644) Weight:  [65 kg] 65 kg (10/16 0023)   Intake/Output from previous day: 10/15 0701 - 10/16 0700 In: 1800 [P.O.:1800] Out: 651 [Urine:601; Drains:50] Intake/Output this shift: No intake/output data recorded.   General appearance: alert Cardio: RRR Abd: distended Incision: no significant drainage, preveena vac in place  Lab Results:  Recent Labs    06/09/19 0404  WBC 8.9  HGB 7.6*  HCT 24.6*  PLT 482*   BMET Recent Labs    06/09/19 0404  NA 140  K 3.3*  CL 103  CO2 26  GLUCOSE 113*  BUN 8  CREATININE 0.66  CALCIUM 8.9   PT/INR No results for input(s): LABPROT, INR in the last 72 hours. ABG No results for input(s): PHART, HCO3 in the last 72 hours.  Invalid input(s): PCO2, PO2  MEDS, Scheduled . Chlorhexidine Gluconate Cloth  6 each Topical Daily  . enoxaparin (LOVENOX) injection  40 mg Subcutaneous Q24H  . gabapentin  300 mg Oral BID  . levothyroxine  25 mcg Oral QAC breakfast  . lip balm  1 application Topical BID  . morphine  60 mg Oral Q6H  . pantoprazole  80 mg Oral Daily  . polyethylene glycol  17 g Oral Daily  . saccharomyces boulardii  250 mg Oral BID  . vortioxetine HBr  5 mg Oral Daily    Studies/Results: Dg Abd 2 Views  Result Date: 06/10/2019 CLINICAL DATA:  53 year old female with a history of bowel perforation EXAM: ABDOMEN - 2 VIEW COMPARISON:  Prior CT imaging 10/22/2018 FINDINGS: Interval removal of pigtail drainage catheter within the left upper quadrant and the right upper quadrant.  Surgical drain of the low anatomic pelvis with surgical staples in place. Air-fluid levels on the upright image. No abnormally distended small bowel or colon. Gas within stomach. No unexpected radiopaque foreign body. Surgical changes within anatomic pelvis IMPRESSION: Air-fluid levels within the abdomen, may indicate nonspecific enteritis or partial bowel obstruction/ileus, as there is no distention to indicate obstruction at this time. Surgical changes of the abdomen with surgical staples in place and a surgical drain. Electronically Signed   By: Corrie Mckusick D.O.   On: 06/10/2019 10:00    Assessment: s/p Procedure(s): OPEN COLOSTOMY REVERSAL lysis of adhesions,drainage intraperitoneal abcess small bowel resection and scar revision, componentseparation hernia repair with phasix mesh Patient Active Problem List   Diagnosis Date Noted  . Hypothyroidism 06/03/2019  . Chronic pain syndrome 06/03/2019  . Incisional hernia s/p open component separation & repair w absorbable Phasix mesh 06/02/2019 06/03/2019  . Diverticulosis 03/2019  . Hair loss 01/06/2019  . Constipation 10/12/2018  . Intra-abdominal abscess ([place]) 10/10/2018  . Anemia due to chronic illness 10/01/2018  . Rectal perforation s/p partial colectomy/colostomy Jan 2020 08/27/2018  . Hx SBO 07/2018  . Impingement syndrome of right shoulder region 11/10/2017  . Vaginal atrophy 07/05/2016  . Acromioclavicular joint arthritis 12/25/2015  . Cervical spine degeneration 06/19/2015  . Full thickness rotator cuff tear 01/06/2015  . GERD (gastroesophageal reflux disease)  11/07/2014  . Hearing loss 03/04/2013  . Benign essential HTN 09/13/2011  . History of colonic polyps 09/13/2011  . Tobacco use disorder 07/10/2011  . Long term (current) use of opiate analgesic 06/04/2011  . Chronic neck pain 06/04/2011  . Depression 07/17/2009  . Rotator cuff syndrome of right shoulder 06/29/2009    Expected post op pain  Plan: Switch to reg  diet  Cont home narcotics scheduled.  Oxycodone PRN.  Decrease IV dilaudid use  Awaiting plan for outpatient pain control.  I cannot prescribe her additional narcotics since she is getting these from another physician.  I have sent a message to her MD to discuss this  Constipation: Miralax daily.  Dulcolax PRN  ARF:  Resolved, urinating well per pt.  Floor staff needs to record output better    Acute blood loss anemia better after transfusion, hgb ok  LOS: 9 days     .Rosario Adie, [facility], [place]   06/11/2019 10:23 AM

## 2019-06-11 NOTE — Discharge Instructions (Addendum)
ABDOMINAL SURGERY: POST OP INSTRUCTIONS  1. DIET: Follow a light bland diet the first 24 hours after arrival home, such as soup, liquids, crackers, etc.  Be sure to include lots of fluids daily.  Avoid fast food or heavy meals as your are more likely to get nauseated.  Do not eat any uncooked fruits or vegetables for the next 2 weeks as your colon heals. 2. Take your usually prescribed home medications unless otherwise directed. 3. PAIN CONTROL: a. Pain is best controlled by a usual combination of three different methods TOGETHER: i. Ice/Heat ii. Over the counter pain medication iii. Prescription pain medication b. Most patients will experience some swelling and bruising around the incisions.  Ice packs or heating pads (30-60 minutes up to 6 times a day) will help. Use ice for the first few days to help decrease swelling and bruising, then switch to heat to help relax tight/sore spots and speed recovery.  Some people prefer to use ice alone, heat alone, alternating between ice & heat.  Experiment to what works for you.  Swelling and bruising can take several weeks to resolve.   c. It is helpful to take an over-the-counter pain medication regularly for the first few weeks.  Choose one of the following that works best for you: i. Naproxen (Aleve, etc)  Two 220mg  tabs twice a day ii. Ibuprofen (Advil, etc) Three 200mg  tabs four times a day (every meal & bedtime) iii. Acetaminophen (Tylenol, etc) 500-650mg  four times a day (every meal & bedtime) d. A  prescription for pain medication (such as oxycodone, hydrocodone, etc) should be given to you upon discharge.  Take your pain medication as prescribed.  i. If you are having problems/concerns with the prescription medicine (does not control pain, nausea, vomiting, rash, itching, etc), please call us [phone] to see if we need to switch you to a different pain medicine that will work better for you and/or control your side effect better. ii. If you  need a refill on your pain medication, please contact your pharmacy.  They will contact our office to request authorization. Prescriptions will not be filled after 5 pm or on week-ends. 4. Avoid getting constipated.  Between the surgery and the pain medications, it is common to experience some constipation.  Increasing fluid intake and taking a fiber supplement (such as Metamucil, Citrucel, FiberCon, MiraLax, etc) 1-2 times a day regularly will usually help prevent this problem from occurring.  A mild laxative (prune juice, Milk of Magnesia, MiraLax, etc) should be taken according to package directions if there are no bowel movements after 48 hours.   5. Watch out for diarrhea.  If you have many loose bowel movements, simplify your diet to bland foods & liquids for a few days.  Stop any stool softeners and decrease your fiber supplement.  Switching to mild anti-diarrheal medications (Kayopectate, Pepto Bismol) can help.  If this worsens or does not improve, please call us. 6. Empty your drain and record daily outputs.  Call the office when your daily output is less than 30 ml per day for 2 days in a row. 7. Wash / shower every day.  You may shower over the incision / wound.  Avoid baths until the skin is fully healed.  Continue to shower over incision(s) after the dressing is off. 8. You may leave the incision open to air.  You may replace a dressing/Band-Aid to cover the incision for comfort if you wish. 9. ACTIVITIES as tolerated:   a.  You may resume regular (light) daily activities beginning the next day--such as daily self-care, walking, climbing stairs--gradually increasing activities as tolerated.  If you can walk 30 minutes without difficulty, it is safe to try more intense activity such as jogging, treadmill, bicycling, low-impact aerobics, swimming, etc.  No heavy lifting for 8 weeks after surgery. b. Save the most intensive and strenuous activity for last such as sit-ups, heavy lifting, contact  sports, etc  Refrain from any heavy lifting or straining until you are off narcotics for pain control.   c. DO NOT PUSH THROUGH PAIN.  Let pain be your guide: If it hurts to do something, don't do it.  Pain is your body warning you to avoid that activity for another week until the pain goes down. d. You may drive when you are no longer taking prescription pain medication, you can comfortably wear a seatbelt, and you can safely maneuver your car and apply brakes. e. Dennis Bast may have sexual intercourse when it is comfortable.  10. FOLLOW UP in our office a. Please call [facility] at [phone] to set up an appointment to see your surgeon in the office for a follow-up appointment approximately 1-2 weeks after your surgery. b. Make sure that you call for this appointment the day you arrive home to insure a convenient appointment time. 10. IF YOU HAVE DISABILITY OR FAMILY LEAVE FORMS, BRING THEM TO THE OFFICE FOR PROCESSING.  DO NOT GIVE THEM TO YOUR DOCTOR.   WHEN TO CALL us [phone]: 1. Poor pain control 2. Reactions / problems with new medications (rash/itching, nausea, etc)  3. Fever over 101.5 F (38.5 C) 4. Inability to urinate 5. Nausea and/or vomiting 6. Worsening swelling or bruising 7. Continued bleeding from incision. 8. Increased pain, redness, or drainage from the incision  The clinic staff is available to answer your questions during regular business hours (8:30am-5pm).  Please dont hesitate to call and ask to speak to one of our nurses for clinical concerns.   A surgeon from [facility] is always on call at the hospitals   If you have a medical emergency, go to the nearest emergency room or call 911.    [facility], [place], [place], [place], [place]  [address] ? MAIN: [phone]) [phone] ? TOLL FREE: [phone] ? FAX [phone] [email or web address]

## 2019-06-11 NOTE — Telephone Encounter (Signed)
Dr Ronnald Ramp, pt states a dr Leighton Ruff has been texting you??? And you wont return her call. Triage has not gotten a call from dr Marcello Moores. Her office # is [id number]. She states they need to speak about prescribing pt more pain medicines. Pt is not happy.

## 2019-06-14 ENCOUNTER — Telehealth: Payer: Self-pay | Admitting: *Deleted

## 2019-06-14 NOTE — Telephone Encounter (Signed)
Pt called and was very angry. She stated dr Ronnald Ramp is a Control and instrumentation engineer and nurse always defends her and the others. She stated of course the notes would be negative toward her. She agreed to appt thurs 10/22 dr Ronnald Ramp.

## 2019-06-17 ENCOUNTER — Ambulatory Visit (INDEPENDENT_AMBULATORY_CARE_PROVIDER_SITE_OTHER): Payer: PPO | Admitting: Internal Medicine

## 2019-06-17 ENCOUNTER — Other Ambulatory Visit: Payer: Self-pay

## 2019-06-17 ENCOUNTER — Encounter: Payer: Self-pay | Admitting: Internal Medicine

## 2019-06-17 VITALS — BP 91/56 | HR 80 | Temp 99.2°F | Ht 65.0 in | Wt 136.1 lb

## 2019-06-17 DIAGNOSIS — Z933 Colostomy status: Secondary | ICD-10-CM

## 2019-06-17 DIAGNOSIS — K631 Perforation of intestine (nontraumatic): Secondary | ICD-10-CM

## 2019-06-17 DIAGNOSIS — Z9889 Other specified postprocedural states: Secondary | ICD-10-CM | POA: Diagnosis not present

## 2019-06-17 DIAGNOSIS — G8929 Other chronic pain: Secondary | ICD-10-CM

## 2019-06-17 DIAGNOSIS — R109 Unspecified abdominal pain: Secondary | ICD-10-CM

## 2019-06-17 DIAGNOSIS — Z9049 Acquired absence of other specified parts of digestive tract: Secondary | ICD-10-CM | POA: Diagnosis not present

## 2019-06-17 DIAGNOSIS — Z79891 Long term (current) use of opiate analgesic: Secondary | ICD-10-CM

## 2019-06-17 DIAGNOSIS — R110 Nausea: Secondary | ICD-10-CM

## 2019-06-17 DIAGNOSIS — M542 Cervicalgia: Secondary | ICD-10-CM

## 2019-06-17 NOTE — Patient Instructions (Addendum)
Ms. Balta,  I am glad the surgery went well. It will take some time for the wounds to heal. Try to let the sites air dry if possible when you are at home. Please continue to follow-up with your surgeon.   In the short term setting (4-6 days), it is alright to take an extra 30mg  morphine if you need it to complete your daily activities every 6 hours. However, within the week, the goal is to be back on your usual 1 pill (30mg ) every 6 hours as needed.

## 2019-06-22 NOTE — Assessment & Plan Note (Addendum)
Pt recently admitted for an open colostomy reversal and hernia repair on 06/02/2019. Her hospital stay was complicated by difficult to control post-op pain. She was having BMs and tolerating a diet before her discharge home on 10/16.  Today, pt states that she has had a tough time since her discharge due to continued pain and drainage from her abdominal wounds that is soaking her clothes. Endorses passing flatus, having BMs, and eating a diet at home. Photos of abdominal incisions attached in the chart. On exam her wounds exhibit minimal surrounding erythema and no active drainage or purulence. Pt states that she is scheduled to follow-up with her surgeon in 2 weeks.  Plan - instructed pt to try and leave her bandages off while at home to keep the wounds dry - okay to increase opioid medication in the short term, with the goal to be back at her chronic dose within the coming week

## 2019-06-22 NOTE — Progress Notes (Signed)
Internal Medicine Clinic Attending  I saw and evaluated the patient.  I personally confirmed the key portions of the history and exam documented by Dr. Mazzarella and I reviewed pertinent patient test results.  The assessment, diagnosis, and plan were formulated together and I agree with the documentation in the resident's note.     

## 2019-06-22 NOTE — Assessment & Plan Note (Signed)
Pt currently prescribed 30mg  morphine q6h PRN for her chronic neck pain. She endorses taking 60mg  every 4 or 6 hours over the last 6 days since her discharge from the hospital for increased pain related to her postoperative pain. Discussed with pt that it is alright for her to occasionally take an extra pill in the short term setting. However, provider and pt agreed that within 1 week her goal is to be back to her chronic opioid dose.  Plan - prescriptions at her pharmacy for morphine 30mg  (120 tablets), good through 08/29/2019
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
## 2019-06-29 ENCOUNTER — Telehealth: Payer: Self-pay | Admitting: Internal Medicine

## 2019-06-29 MED ORDER — MORPHINE SULFATE 30 MG PO TABS: 30.0000 mg | ORAL_TABLET | Freq: Four times a day (QID) | ORAL | 0 refills | Status: DC | PRN

## 2019-06-29 MED ORDER — VORTIOXETINE HBR 10 MG PO TABS: 5.0000 mg | ORAL_TABLET | Freq: Every day | ORAL | 1 refills | Status: DC

## 2019-06-29 NOTE — Telephone Encounter (Signed)
Refill Request   morphine (MSIR) 30 MG tablet  [facility] #[id number] - [place], [place] - [address]

## 2019-06-29 NOTE — Telephone Encounter (Signed)
Pt requesting a callback [phone] pt is wanting to increase medicine

## 2019-06-29 NOTE — Telephone Encounter (Signed)
Called pt back to discuss medication refills. Pt asking about her Trintellix prescription that was recently refilled. Has been taking 5mg  daily, hasn't seen much effect yet, and denies any side effects. Instructed to increase the dose to 10mg  daily (taking two 5mg  tablets per day). She has follow-up scheduled with me on 12/17, will follow-up PHQ-9 at that time. New prescription for 10mg  tablets also sent into her pharmacy.  Pt also interested in morphine refill today. She states she has 8 pills at home currently. Per chart review, her next morphine prescription is due in two days (on 11/5). Pt is prescribed 30mg  tablets q6h, so her pill count will last until her next scheduled prescription refill. Pt was understanding of this. PDMP reviewed at appropriate this evening. [facility], lasting through 09/30/2019.

## 2019-08-12 ENCOUNTER — Encounter: Payer: Self-pay | Admitting: Internal Medicine

## 2019-08-12 ENCOUNTER — Ambulatory Visit (INDEPENDENT_AMBULATORY_CARE_PROVIDER_SITE_OTHER): Payer: PPO | Admitting: Internal Medicine

## 2019-08-12 ENCOUNTER — Other Ambulatory Visit: Payer: Self-pay

## 2019-08-12 VITALS — BP 152/103 | HR 86 | Temp 98.7°F | Ht 65.0 in | Wt 136.2 lb

## 2019-08-12 DIAGNOSIS — Z23 Encounter for immunization: Secondary | ICD-10-CM | POA: Insufficient documentation

## 2019-08-12 DIAGNOSIS — Z79899 Other long term (current) drug therapy: Secondary | ICD-10-CM | POA: Diagnosis not present

## 2019-08-12 DIAGNOSIS — R300 Dysuria: Secondary | ICD-10-CM

## 2019-08-12 DIAGNOSIS — I10 Essential (primary) hypertension: Secondary | ICD-10-CM | POA: Diagnosis not present

## 2019-08-12 LAB — POCT URINALYSIS DIPSTICK
Bilirubin, UA: NEGATIVE
Blood, UA: NEGATIVE
Glucose, UA: NEGATIVE
Ketones, UA: NEGATIVE
Nitrite, UA: NEGATIVE
Protein, UA: NEGATIVE
Spec Grav, UA: 1.025 (ref 1.010–1.025)
Urobilinogen, UA: 0.2 E.U./dL
pH, UA: 6.5 (ref 5.0–8.0)

## 2019-08-12 IMAGING — DX DG CHEST 1V PORT
1 series · 1 of 1 positions shown · non-contrast
Comparison: 09/06/2018.  09/03/2018.  09/01/2018.

CLINICAL DATA: Respiratory failure.

EXAM:
PORTABLE CHEST 1 VIEW

[chest ap]
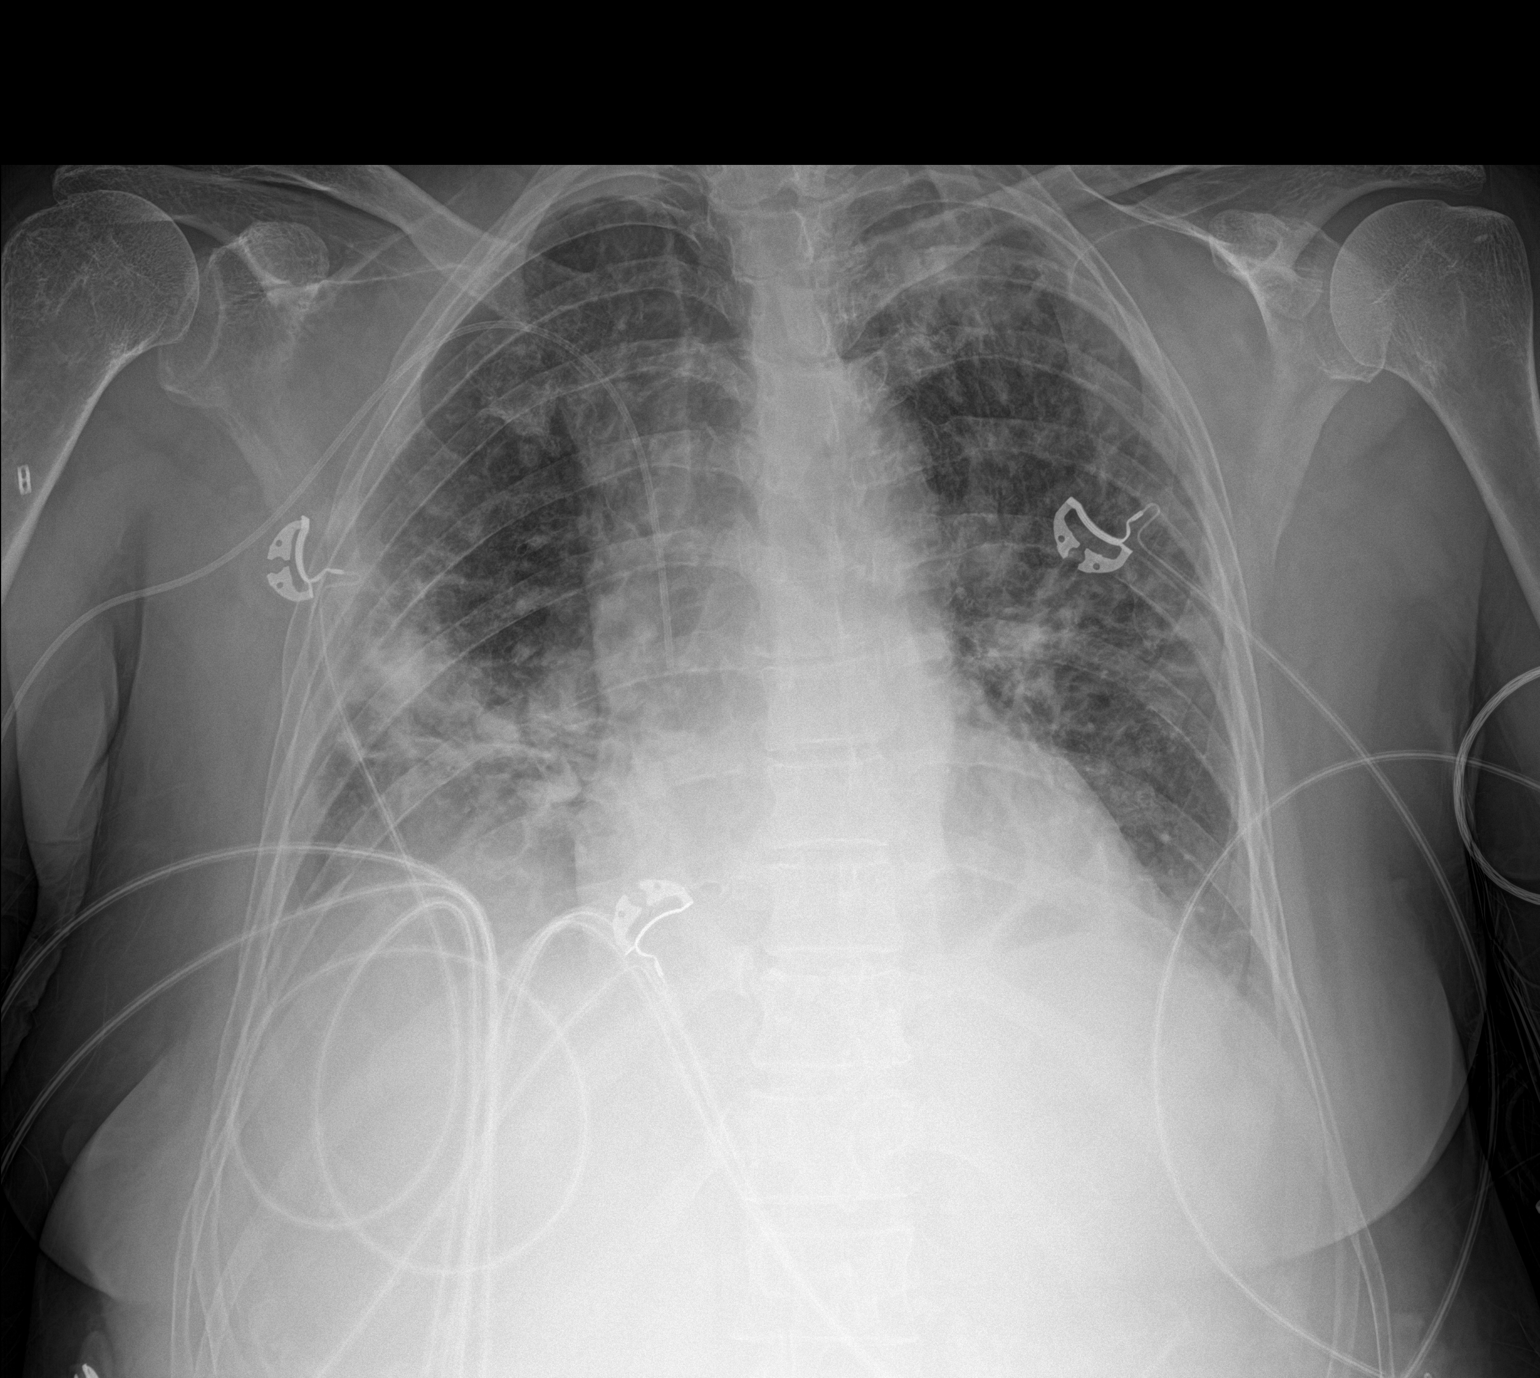

[1 of 1 positions shown; findings below may reference images not displayed]

FINDINGS: Right PICC line in stable position. Heart size stable. Persistent
right mid lung atelectasis and infiltrate. Diffuse bilateral
interstitial prominence again noted. Similar findings noted on prior
exam. No pleural effusion or pneumothorax. Cervical spine fusion.
IMPRESSION: 1.  Right PICC line in stable position.

2. Right mid lung atelectasis and infiltrate. Diffuse bilateral
pulmonary interstitial prominence. Similar findings noted on prior
exams.

## 2019-08-12 MED ORDER — SHINGRIX 50 MCG/0.5ML IM SUSR: 0.5000 mL | Freq: Once | INTRAMUSCULAR | 0 refills | Status: AC

## 2019-08-12 MED ORDER — OMEPRAZOLE 40 MG PO CPDR: 40.0000 mg | DELAYED_RELEASE_CAPSULE | Freq: Every day | ORAL | 3 refills | Status: DC

## 2019-08-12 MED ORDER — LISINOPRIL 10 MG PO TABS: 10.0000 mg | ORAL_TABLET | Freq: Every day | ORAL | 11 refills | Status: DC

## 2019-08-12 MED ORDER — LEVOTHYROXINE SODIUM 25 MCG PO TABS: 25.0000 ug | ORAL_TABLET | Freq: Every day | ORAL | 3 refills | Status: DC
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
## 2019-08-12 NOTE — Addendum Note (Signed)
Addended by: Lalla Brothers T on: 08/12/2019 03:38 PM   Modules accepted: Level of Service

## 2019-08-12 NOTE — Assessment & Plan Note (Signed)
Patient is hypertensive in clinic today. She also says these readings correlate with her averages at home. Fortunately, she is asymptomatic, but would benefit from tighter blood pressure control. She has good renal function, so will increase Lisinopril to 10 mg. Follow-up with PCP in 1-2 months.

## 2019-08-12 NOTE — Assessment & Plan Note (Signed)
Patient requests shingles vaccine today. Prescription for Shingrix written. Discussed potential adverse effects, particularly deltoid pain and flu-like syndrome afterwards.

## 2019-08-12 NOTE — Patient Instructions (Addendum)
Ms. Arcari, We increased your blood pressure medication to Lisinopril 10 mg.   I am checking your urine to ensure it is not infected.  I am giving you a prescription for the shingles vaccine since we do not offer that here. Please be aware that it causes significant soreness at the injection site and can cause a flu-like syndrome afterwards. This will be given in 2 separate doses.   Please follow-up with your PCP in 1-2 months.

## 2019-08-12 NOTE — Progress Notes (Signed)
Internal Medicine Clinic Attending  Case discussed with Dr. Bloomfield at the time of the visit.  We reviewed the resident's history and exam and pertinent patient test results.  I agree with the assessment, diagnosis, and plan of care documented in the resident's note.  

## 2019-08-13 LAB — URINALYSIS, COMPLETE
Bilirubin, UA: NEGATIVE
Glucose, UA: NEGATIVE
Ketones, UA: NEGATIVE
Nitrite, UA: NEGATIVE
Protein,UA: NEGATIVE
RBC, UA: NEGATIVE
Specific Gravity, UA: 1.018 (ref 1.005–1.030)
Urobilinogen, Ur: 0.2 mg/dL (ref 0.2–1.0)
pH, UA: 6.5 (ref 5.0–7.5)

## 2019-08-13 LAB — MICROSCOPIC EXAMINATION: Casts: NONE SEEN /lpf

## 2019-08-13 IMAGING — US US IMAGE GUIDED DRAINAGE BY PERCUTANEOUS CATHETER
1 series · 13 of 17 positions shown · non-contrast
Comparison: none

INDICATION: History of rectal perforation, ARDS. Perihepatic collections
suspected on CT and ultrasound studies. Aspiration requested.

[Series 1: us image guided drainage by percutaneous catheter · 13 of 17 slices shown]
[im 1/17]
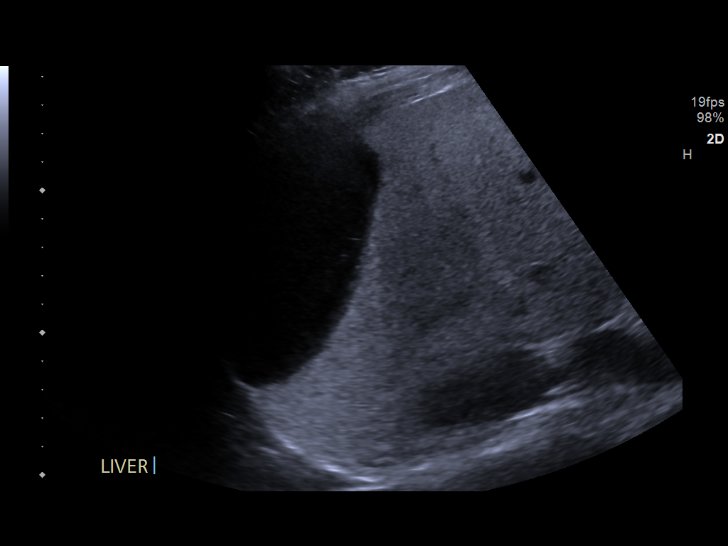
[im 2/17]
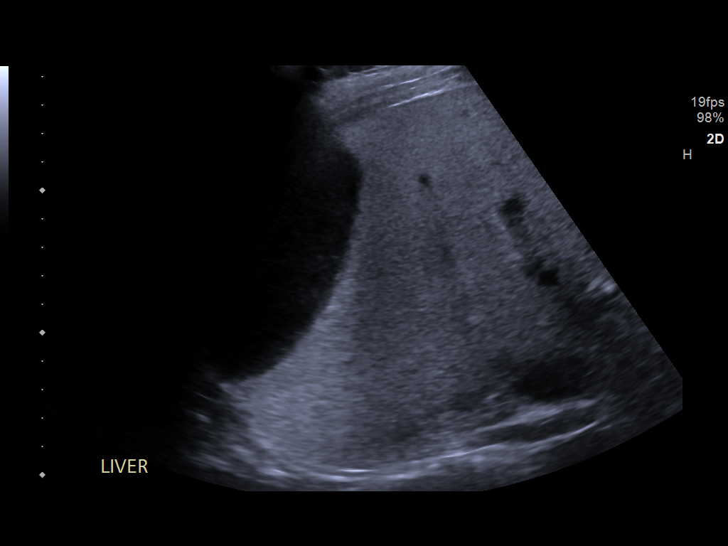
[im 4/17]
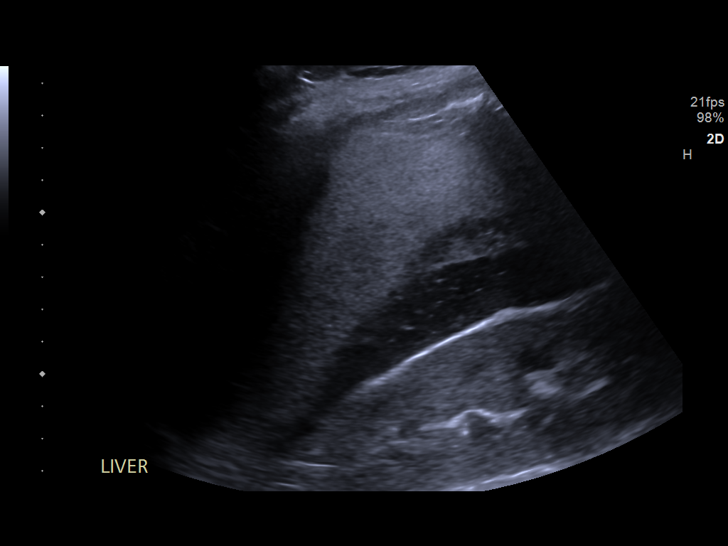
[im 5/17]
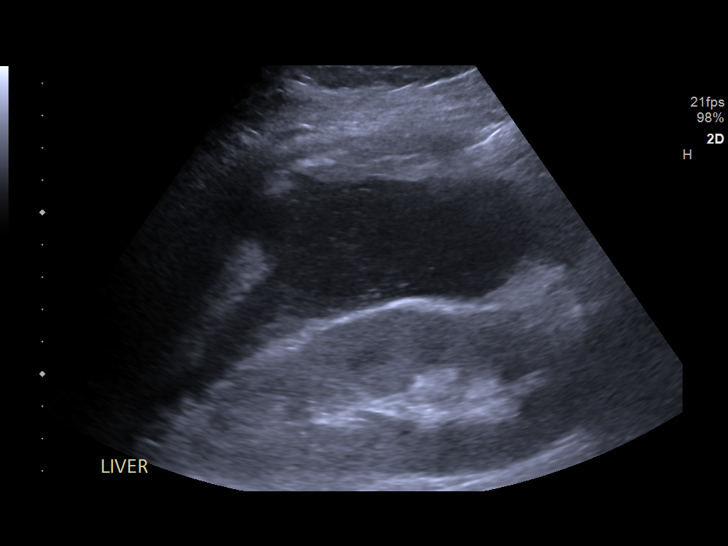
[im 6/17]
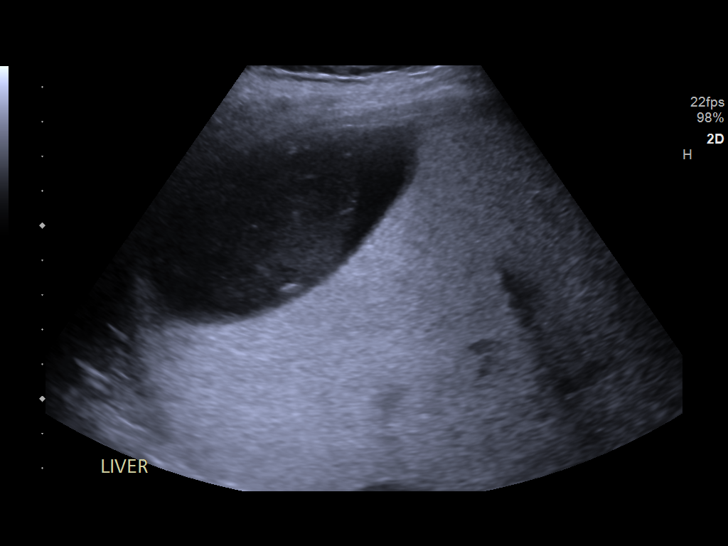
[im 8/17]
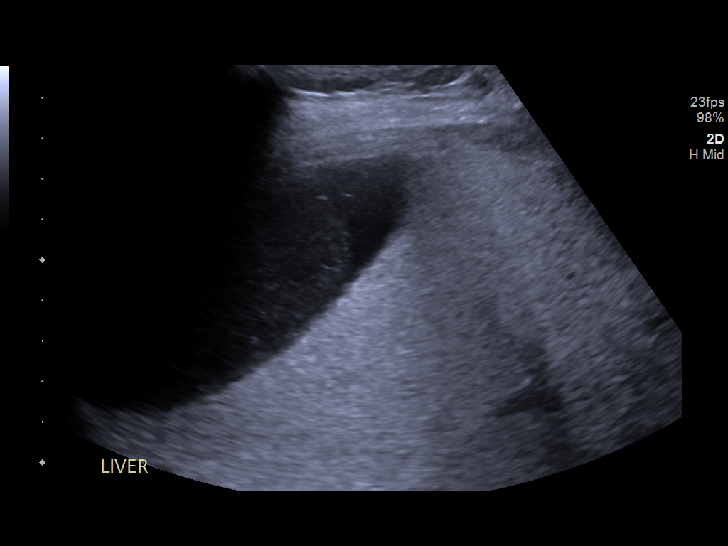
[im 9/17]
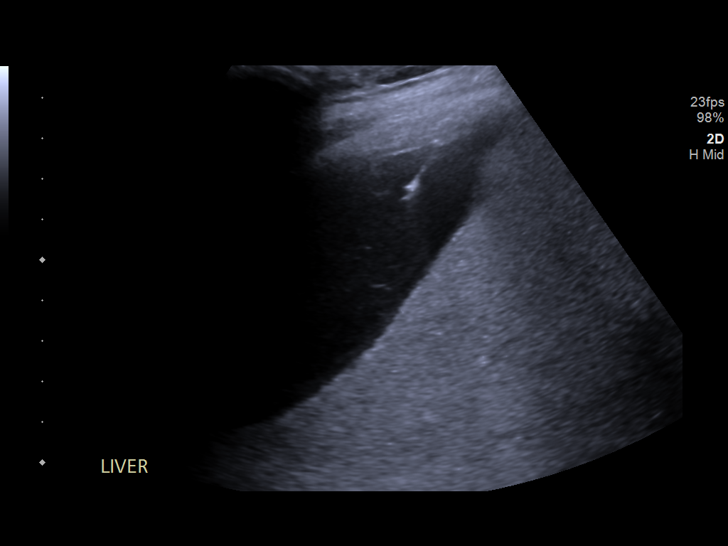
[im 10/17]
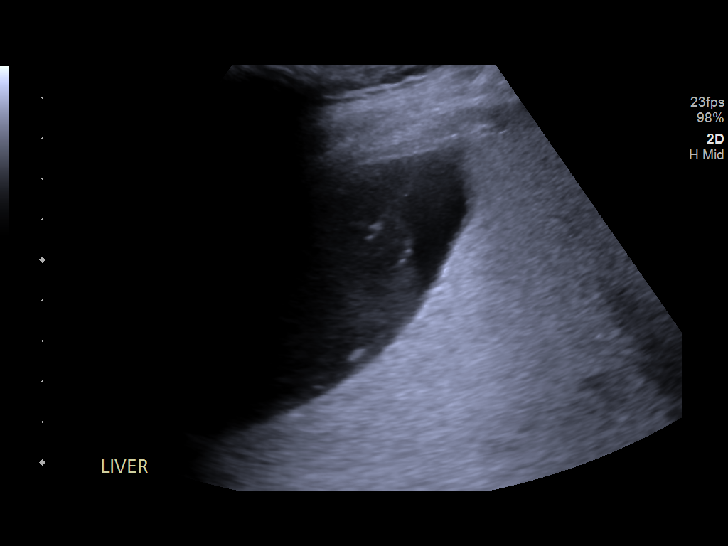
[im 12/17]
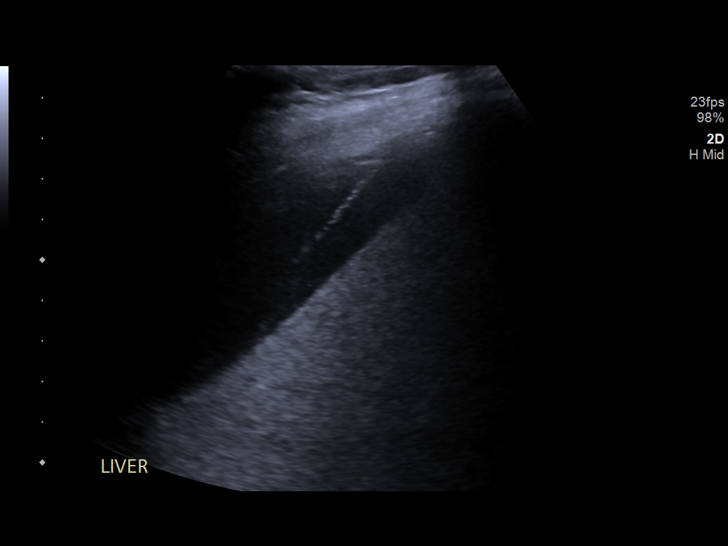
[im 13/17]
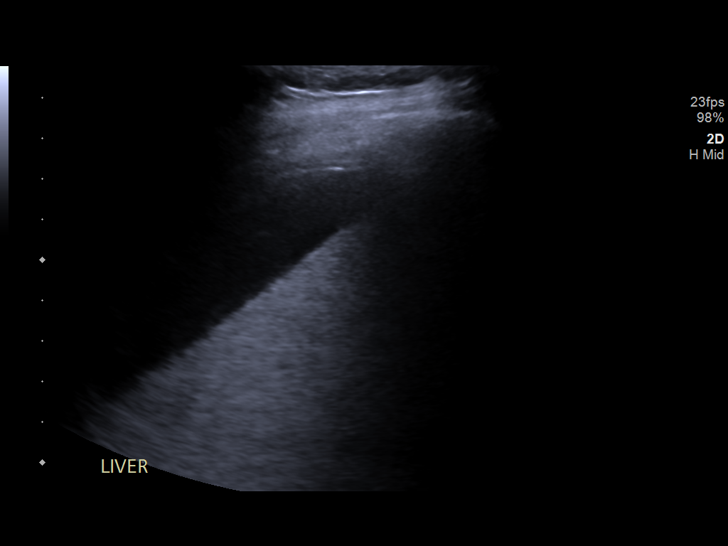
[im 14/17]
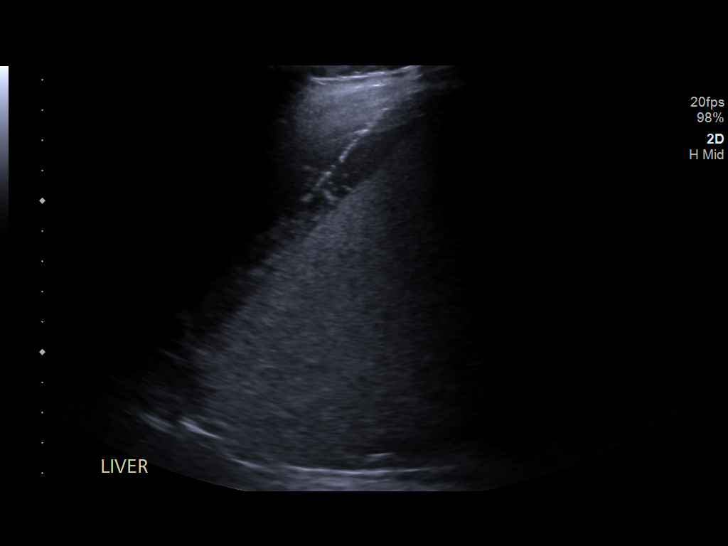
[im 16/17]
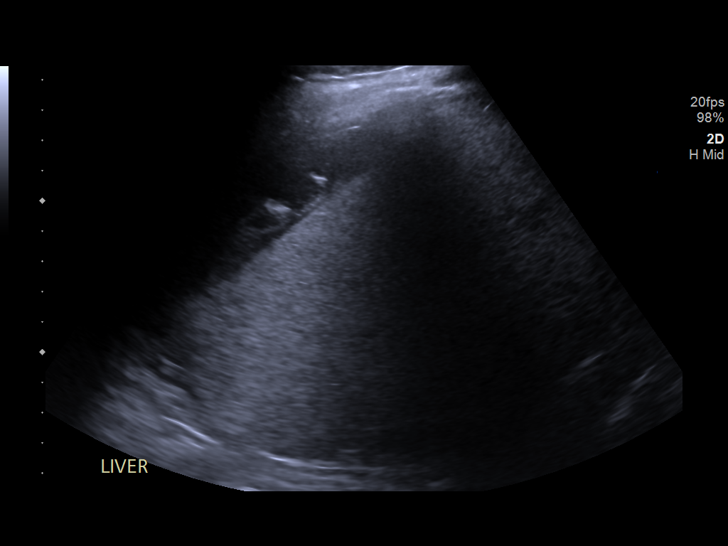
[im 17/17]
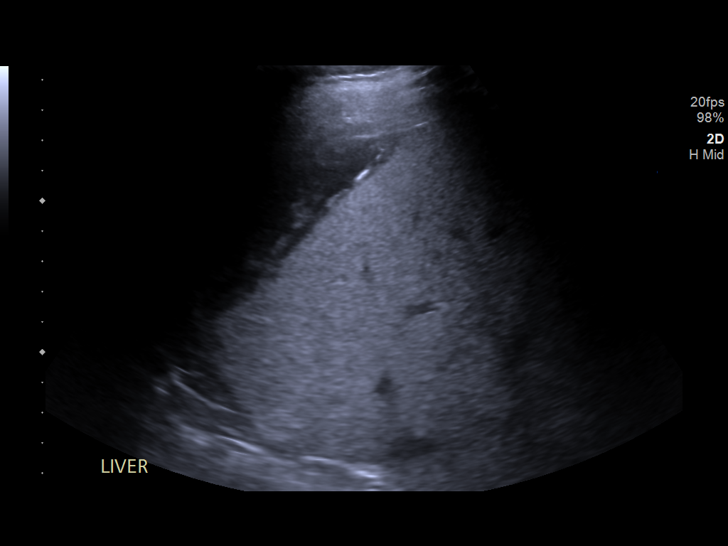

[13 of 17 positions shown; findings below may reference images not displayed]

EXAM:
ULTRASOUND GUIDED ASPIRATION OF PERIHEPATIC COLLECTION

MEDICATIONS:
lidocaine 1% subcutaneous

ANESTHESIA/SEDATION:
Intravenous Fentanyl and Versed were administered as conscious
sedation during continuous monitoring of the patient's level of
consciousness and physiological / cardiorespiratory status by the
radiology RN, with a total moderate sedation time of 20 minutes.

PROCEDURE:
The procedure, risks, benefits, and alternatives were explained to
the patient. Questions regarding the procedure were encouraged and
answered. The patient understands and consents to the procedure.

Survey ultrasound of the right upper quadrant was performed.

Perihepatic collection was localized and an appropriate skin entry
site was determined.

The operative field was prepped with chlorhexidine in a sterile
fashion, and a sterile drape was applied covering the operative
field. A sterile gown and sterile gloves were used for the
procedure. Local anesthesia was provided with 1% Lidocaine.

Under real-time ultrasound guidance, an 18 gauge trocar needle was
advanced into the collection. Approximately 10 mL of beige opaque
fluid were aspirated, but on ultrasound a significant amount of the
collection persisted. Therefore, the trocar needle was removed and a
10 cm multi sidehole Hans Ludwig Dolle needle was advanced under
ultrasound guidance into the collection. This facilitated aspiration
of another 170 mL of opaque fluid. Because of the potential for
pleural traversal, the tract was not dilated for drain placement.
Small residual of complex material was identified on the final image
after removal of the Hans Ludwig Dolle. No evidence of hemorrhage. The
patient tolerated the procedure well.

COMPLICATIONS:
None immediate.
FINDINGS: Perihepatic collection was localized. 180 mL beige opaque fluid
aspirated as detailed above, and a sample sent for Gram stain and
culture.
IMPRESSION: 1. Technically successful ultrasound-guided aspiration of
perihepatic abscess, removing 180 mL, sample sent for Gram stain and
culture.

## 2019-08-14 ENCOUNTER — Other Ambulatory Visit: Payer: Self-pay | Admitting: Student in an Organized Health Care Education/Training Program

## 2019-08-14 DIAGNOSIS — L659 Nonscarring hair loss, unspecified: Secondary | ICD-10-CM

## 2019-08-16 NOTE — Telephone Encounter (Signed)
Requesting 90 day supply. Thanks 

## 2019-08-31 ENCOUNTER — Telehealth: Payer: Self-pay | Admitting: Internal Medicine

## 2019-08-31 NOTE — Telephone Encounter (Signed)
Ronnald Ramp, will you please call this pt and address her questions as she will have answers then straight from md and not just a nurse

## 2019-08-31 NOTE — Telephone Encounter (Signed)
Patient has questions about her Anti-Depression Medications and would like for a nurse to give her a call back.

## 2019-09-01 ENCOUNTER — Other Ambulatory Visit: Payer: Self-pay | Admitting: *Deleted

## 2019-09-01 MED ORDER — MORPHINE SULFATE 30 MG PO TABS: 30.0000 mg | ORAL_TABLET | Freq: Four times a day (QID) | ORAL | 0 refills | Status: DC | PRN

## 2019-09-01 MED ORDER — VORTIOXETINE HBR 10 MG PO TABS: 10.0000 mg | ORAL_TABLET | Freq: Every day | ORAL | 1 refills | Status: DC

## 2019-09-20 IMAGING — CT CT ABD-PELV W/ CM
3 of 11 series · 10 of 46 positions shown, 16 images · IV contrast (iopamidol)
Comparison: 10/09/2018 CT abdomen and pelvis, CXR 10/16/2018

CLINICAL DATA: Evaluation of ongoing infection, tachycardia, chest
pain and dyspnea. Patient was discharged 2 days ago for the same.
Bilateral abdominal drains and ostomy.

EXAM:
CT ANGIOGRAPHY CHEST
CT ABDOMEN AND PELVIS WITH CONTRAST
TECHNIQUE: Multidetector CT imaging of the chest was performed using the
standard protocol during bolus administration of intravenous
contrast. Multiplanar CT image reconstructions and MIPs were
obtained to evaluate the vascular anatomy. Multidetector CT imaging
of the abdomen and pelvis was performed using the standard protocol
during bolus administration of intravenous contrast.
CONTRAST:  100 cc [id number] IOPAMIDOL ([id number]) INJECTION 76%

[Series 6: thins · axial · 0.76mm/px · z∈[+1174,+1296]mm · 5 of 244 slices shown]
[im 16/244  soft-tissue]
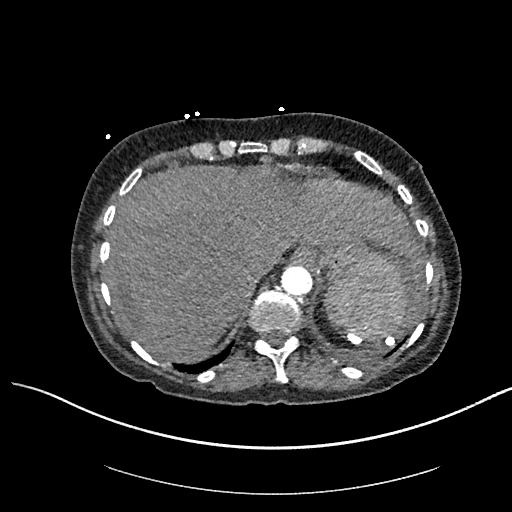
[im 46/244  soft-tissue]
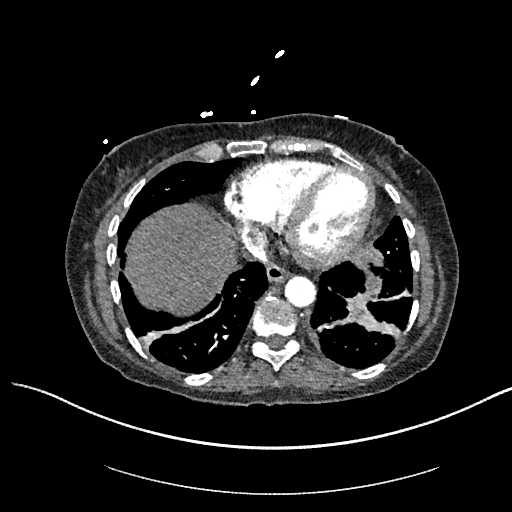
[im 76/244  soft-tissue]
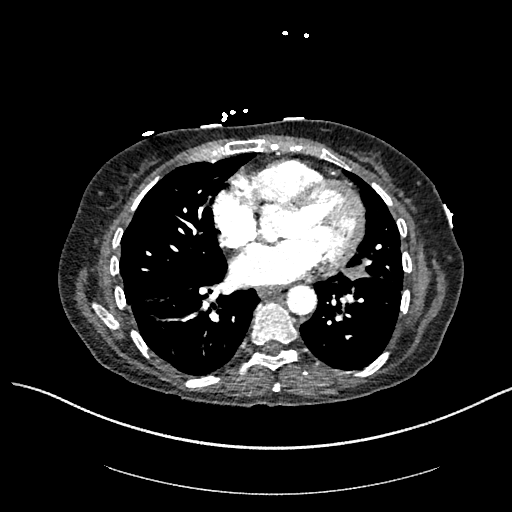
[im 107/244  soft-tissue]
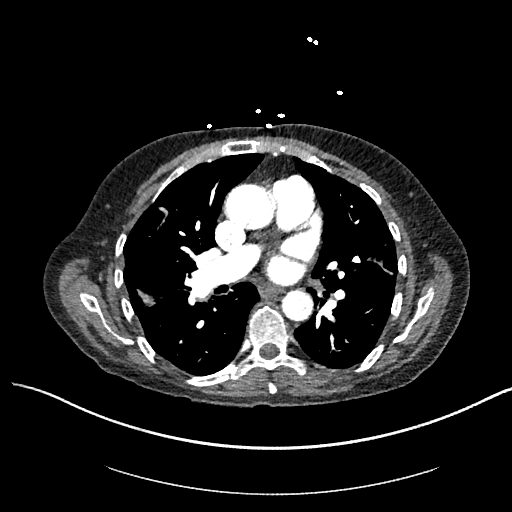
[im 137/244  soft-tissue]
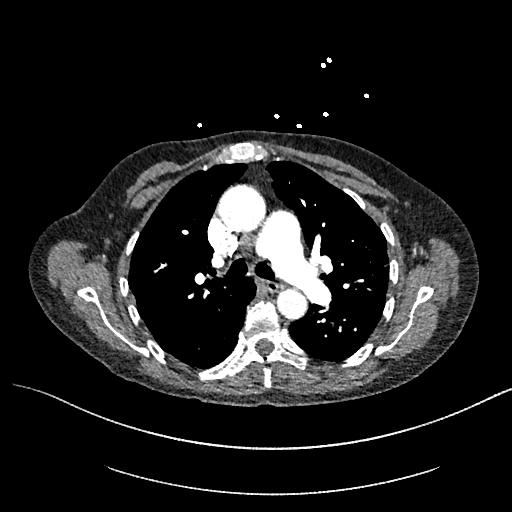

[Series 8: coronal mpr · coronal · 0.48mm/px · 1 of 76 slices shown, 2 images]
[im 38/76  soft-tissue]
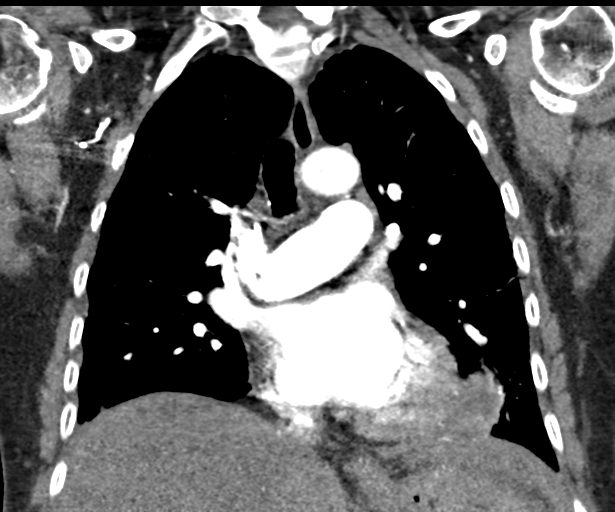
[im 38/76  bone]
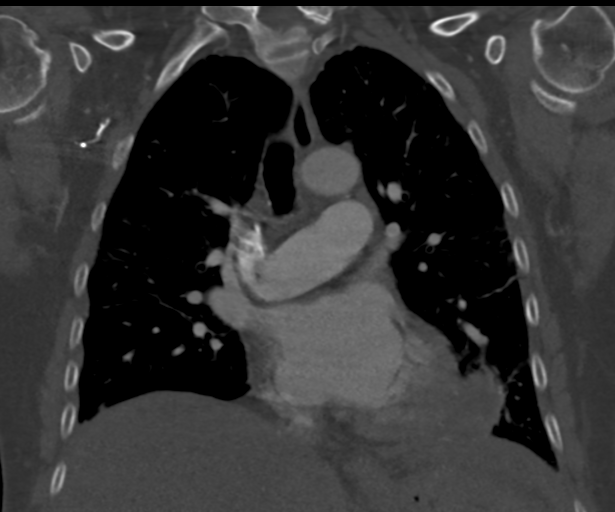

[Series 12: abd/ pelvis 5.0 i30f 1 · axial · 0.80mm/px · z∈[+895,+1160]mm · 4 of 89 slices shown, 9 images]
[im 18/89  soft-tissue]
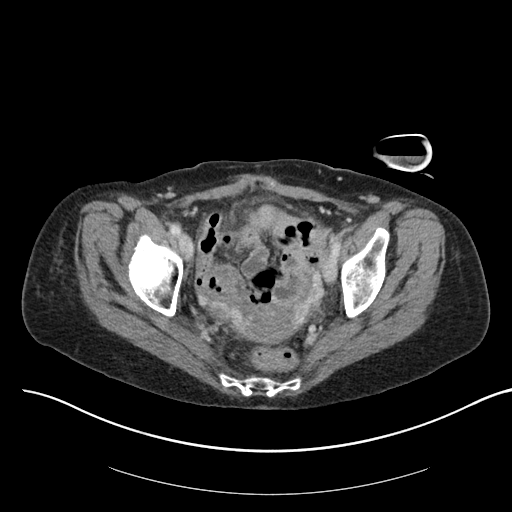
[im 18/89  lung]
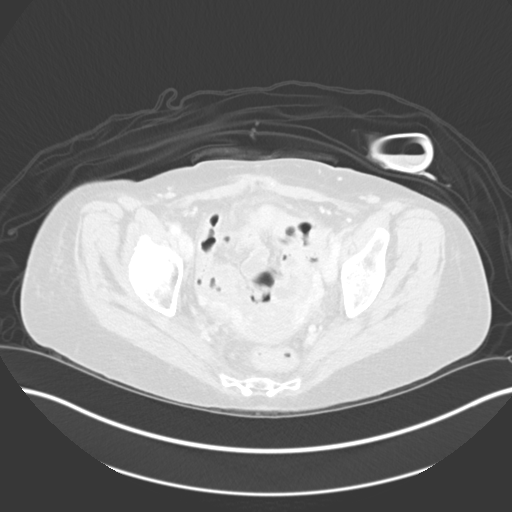
[im 18/89  bone]
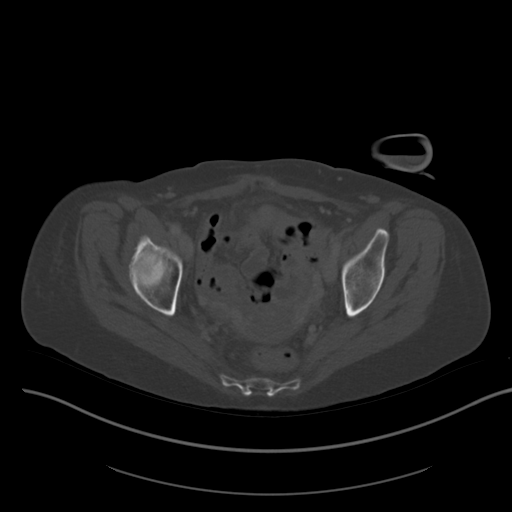
[im 36/89  soft-tissue]
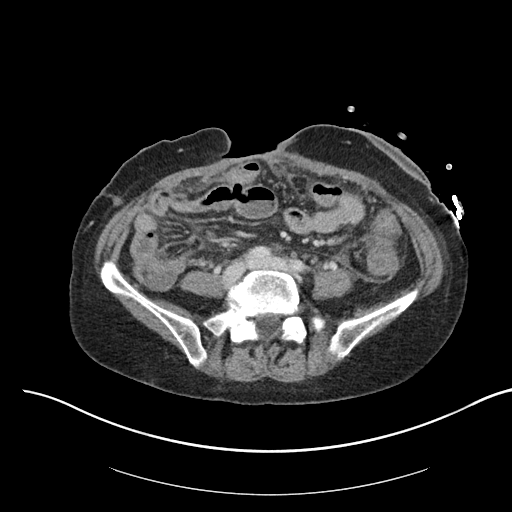
[im 36/89  lung]
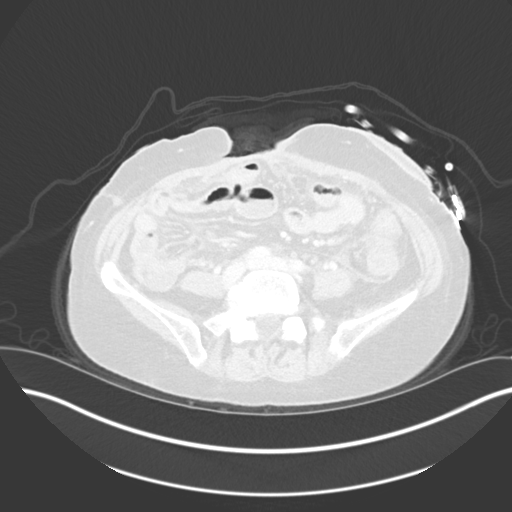
[im 53/89  soft-tissue]
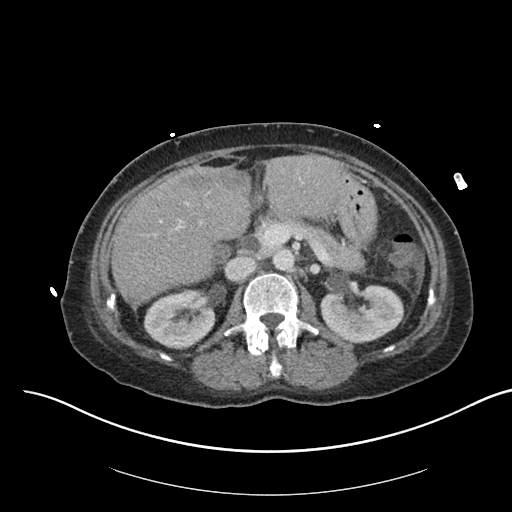
[im 53/89  lung]
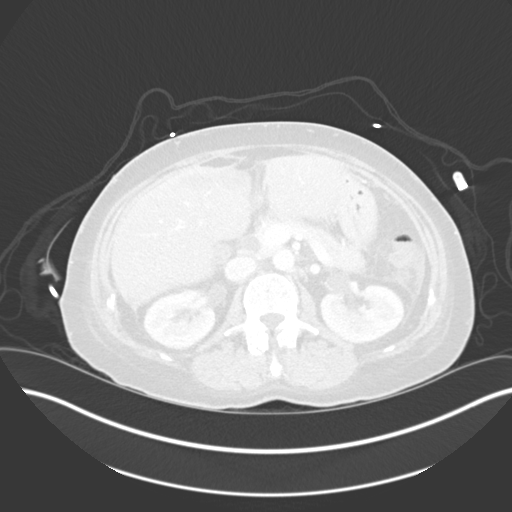
[im 71/89  soft-tissue]
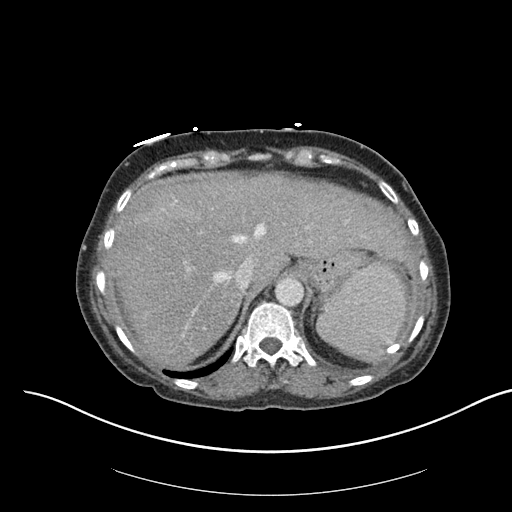
[im 71/89  lung]
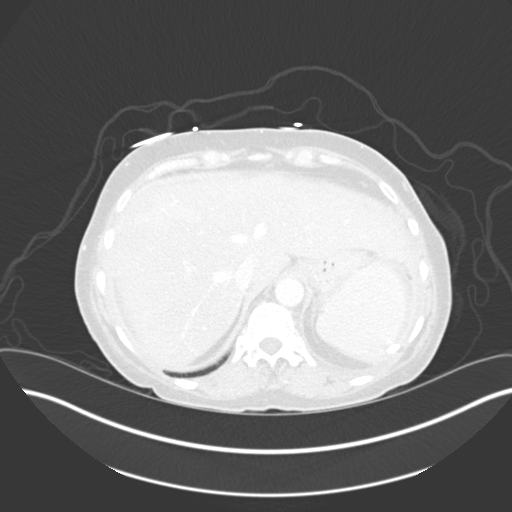

[10 of 46 positions shown; findings below may reference images not displayed]

FINDINGS: CTA CHEST FINDINGS

Cardiovascular: Cardiomegaly without pericardial effusion. Ectasia
of the ascending thoracic aorta to 3.8 cm. No dissection. Dilatation
of the main pulmonary artery 3.2 cm consistent with a component of
chronic pulmonary hypertension. No acute pulmonary embolus. Laminar
contrast flow related artifacts in the right lower lobe.

Mediastinum/Nodes: Normal thyroid glands. Conventional branch
pattern of the great vessels with minimal atherosclerosis at the
origins. No mediastinal nor hilar lymphadenopathy. Patent trachea
and mainstem bronchi. The esophagus is unremarkable.

Lungs/Pleura: Bilateral linear platelike areas of atelectasis are
identified in the upper lobes, lingula and both lower lobes. No
pulmonary consolidation or overt pulmonary edema. No effusion or
pneumothorax. Has been interval clearing of left pleural effusion.
No dominant mass. Biapical pleuroparenchymal scarring is seen.

Musculoskeletal: ACDF of the included lower cervical spine.

Review of the MIP images confirms the above findings.

CT ABDOMEN and PELVIS FINDINGS

Hepatobiliary: Hepatic steatosis is redemonstrated without biliary
dilatation. Areas of focal fatty sparing are noted within the right
hepatic lobe. Small rind of perihepatic fluid is noted unchanged
along the periphery of the right hepatic lobe measuring up to 9 mm
in thickness and essentially unchanged, previously 8 mm. A
percutaneous drain adjacent to the right hepatic lobe is identified
without significant adjacent fluid collection noted. No new or
enlarging perihepatic fluid collections. The gallbladder is
decompressed in appearance.

Pancreas: Unremarkable. No pancreatic ductal dilatation or
surrounding inflammatory changes.

Spleen: Subphrenic drain remain stable with small rim perisplenic
fluid measuring 7 mm in thickness, similar in appearance to prior.
No splenomegaly mass.

Adrenals/Urinary Tract: Adrenal glands are unremarkable. Kidneys are
normal, without renal calculi, focal lesion, or hydronephrosis.
Bladder is unremarkable.

Stomach/Bowel: The stomach is decompressed in appearance. Normal
small bowel rotation without mechanical bowel obstruction or
significant dilatation. The colon is largely decompressed in
appearance simulating thickening. Colitis is believed less likely.
Stable descending colostomy and Helder Brunson. No evidence of bowel
obstruction are

Vascular/Lymphatic: No significant vascular findings are present. No
enlarged abdominal or pelvic lymph nodes.

Reproductive: Uterus and bilateral adnexa are unremarkable.
Bilateral ovarian vein engorgement is noted about the uterus
compatible with changes of pelvic vascular congestion.

Other: Open midline abdominal incision is stable. A few small
intra-abdominopelvic fluid collections are identified without
significant interval change, the largest in the right hemipelvis
approximately 2.3 cm (3 cm previously), in the left lower quadrant
approximately 2.3 cm (2.7 cm previously) cm and right hemiabdomen
2.9 cm (3.4 cm previously).. These appear relatively stable.

Musculoskeletal: Degenerative disc disease L5-S1. No acute nor
aggressive osseous abnormalities.

Review of the MIP images confirms the above findings.
IMPRESSION: CT chest:

1. Linear platelike atelectasis and/or scarring bilaterally in both
upper and lower lobes. No active pulmonary disease.
2. No acute pulmonary embolus, aortic aneurysm or dissection. Stable
ascending thoracic aortic ectasia to 3.8 cm diameter.
3. Dilatation of the main pulmonary artery compatible with chronic
pulmonary hypertension.

CT AP:

1. Stable right perihepatic and left subphrenic percutaneous
drainage catheter is without significant change and perihepatic and
perisplenic fluid. No new or worsening fluid collections are noted.
2. Small nonenhancing intra-abdominopelvic fluid collections,
measuring slightly smaller than prior exam.
3. Stable descending colostomy and Helder Brunson without
complicating features. Open midline abdominal incision appears
stable.
4. Hepatic steatosis.

## 2019-10-26 ENCOUNTER — Other Ambulatory Visit: Payer: Self-pay | Admitting: Internal Medicine

## 2019-10-26 NOTE — Telephone Encounter (Signed)
Refill Request  Pt is also requesting a phone call back  As to when she can pick it up.   morphine (MSIR) 30 MG tablet  [facility] #[id number] - [place], [place] - [address].

## 2019-10-27 MED ORDER — MORPHINE SULFATE 30 MG PO TABS: 30.0000 mg | ORAL_TABLET | Freq: Four times a day (QID) | ORAL | 0 refills | Status: DC | PRN

## 2019-10-27 NOTE — Telephone Encounter (Signed)
Last script filled on 10/02/2019. PDMP reviewed and appropriate. Will fill 30 day supply today, which will be available for pick up on 11/01/2019 and good through 12/01/2019. Pt's next appointment 11/16/2019. Will send in more prescriptions after that visit.

## 2019-10-29 ENCOUNTER — Other Ambulatory Visit: Payer: Self-pay | Admitting: Internal Medicine

## 2019-10-29 NOTE — Telephone Encounter (Signed)
Refused refill request as this prescription is pt's outdated 5mg  Trintellix dose. She has a current Trintellix 10mg  daily prescription that was written on 09/01/2019 for a 90 day prescription with a refill.

## 2019-11-01 ENCOUNTER — Encounter: Payer: Self-pay | Admitting: *Deleted

## 2019-11-01 NOTE — Progress Notes (Signed)

## 2019-11-11 NOTE — Progress Notes (Signed)
Things That May Be Affecting Your Health:  Alcohol  Hearing loss X Pain   X Depression  Home Safety  Sexual Health   Diabetes  Lack of physical activity  Stress   Difficulty with daily activities  Loneliness  Tiredness   Drug use  Medicines  Tobacco use   Falls  Motor Vehicle Safety  Weight   Food choices  Oral Health  Other    YOUR PERSONALIZED HEALTH PLAN : 1. Schedule your next subsequent Medicare Wellness visit in one year 2. Attend all of your regular appointments to address your medical issues 3. Complete the preventative screenings and services   Annual Wellness Visit   Medicare Covered Preventative Screenings and [place] Men and Women Who How Often Need? Date of Last Service Action  Abdominal Aortic Aneurysm Adults with AAA risk factors Once     Alcohol Misuse and Counseling All Adults Screening once a year if no alcohol misuse. Counseling up to 4 face to face sessions.     Bone Density Measurement  Adults at risk for osteoporosis Once every 2 yrs     Lipid Panel Z13.6 All adults without CV disease Once every 5 yrs     Colorectal Cancer   Stool sample or  Colonoscopy All adults 33 and older   Once every year  Every 10 years     Depression All Adults Once a year X Today   Diabetes Screening Blood glucose, post glucose load, or GTT Z13.1  All adults at risk  Pre-diabetics  Once per year  Twice per year     Diabetes  Self-Management Training All adults Diabetics 10 hrs first year; 2 hours subsequent years. Requires Copay     Glaucoma  Diabetics  Family history of glaucoma  African Americans 42 yrs +  Hispanic Americans 72 yrs + Annually - requires coppay     Hepatitis C Z72.89 or F19.20  High Risk for HCV  Born between 1945 and 1965  Annually  Once     HIV Z11.4 All adults based on risk  Annually btw ages 41 & 41 regardless of risk  Annually > 65 yrs if at increased risk     Lung Cancer Screening Asymptomatic adults  aged 67-77 with 30 pack yr history and current smoker OR quit within the last 15 yrs Annually Must have counseling and shared decision making documentation before first screen     Medical Nutrition Therapy Adults with   Diabetes  Renal disease  Kidney transplant within past 3 yrs 3 hours first year; 2 hours subsequent years     Obesity and Counseling All adults Screening once a year Counseling if BMI 30 or higher  Today   Tobacco Use Counseling Adults who use tobacco  Up to 8 visits in one year     Vaccines Z23  Hepatitis B  Influenza   Pneumonia  Adults   Once  Once every flu season  Two different vaccines separated by one year     Next Annual Wellness Visit People with Medicare Every year  Today     Services & Screenings Women Who How Often Need  Date of Last Service Action  Mammogram  Z12.31 Women over 49 One baseline ages 87-39. Annually ager 30 yrs+ X 10/06/2017   Pap tests All women Annually if high risk. Every 2 yrs for normal risk women     Screening for cervical cancer with   Pap (Z01.419 nl or Z01.411abnl) &  HPV Z11.51 Women aged 34 to 17 Once every 5 yrs  07/05/2016   Screening pelvic and breast exams All women Annually if high risk. Every 2 yrs for normal risk women     Sexually Transmitted Diseases  Chlamydia  Gonorrhea  Syphilis All at risk adults Annually for non pregnant females at increased risk        [place] Men Who How Ofter Need  Date of Last Service Action  Prostate Cancer - DRE & PSA Men over 50 Annually.  DRE might require a copay.     Sexually Transmitted Diseases  Syphilis All at risk adults Annually for men at increased risk

## 2019-11-16 ENCOUNTER — Other Ambulatory Visit: Payer: Self-pay

## 2019-11-16 ENCOUNTER — Ambulatory Visit (INDEPENDENT_AMBULATORY_CARE_PROVIDER_SITE_OTHER): Payer: PPO | Admitting: Internal Medicine

## 2019-11-16 DIAGNOSIS — Z79891 Long term (current) use of opiate analgesic: Secondary | ICD-10-CM | POA: Diagnosis not present

## 2019-11-16 DIAGNOSIS — R52 Pain, unspecified: Secondary | ICD-10-CM

## 2019-11-16 DIAGNOSIS — I10 Essential (primary) hypertension: Secondary | ICD-10-CM

## 2019-11-16 DIAGNOSIS — F330 Major depressive disorder, recurrent, mild: Secondary | ICD-10-CM | POA: Diagnosis not present

## 2019-11-16 MED ORDER — MORPHINE SULFATE 30 MG PO TABS: 30.0000 mg | ORAL_TABLET | Freq: Four times a day (QID) | ORAL | 0 refills | Status: DC | PRN

## 2019-11-16 NOTE — Assessment & Plan Note (Signed)
Pt on chronic opioid analgesia with Morphine 30mg  q6h PRN. Having continued daily constant pain at a 9/10. States she requires the morphine daily to be able to complete her ADLs. PDMP reviewed and appropriate.  - three 1 month prescriptions sent in to pt's pharmacy for morphine - good through July 3rd 2021 - advised pt of need for in person appointment for refills with her new provider in July - [place] at next visit per pt's pain contract

## 2019-11-16 NOTE — Assessment & Plan Note (Signed)
Pt endorses taking lisinopril 10mg  daily. Denies medication side effects, cough, chest pain, shortness of breath, or dizziness/lightheadedness. She takes her blood pressure almost every day at home. Most recent readings 116/77, 131/92, and 112/76.  Assessment - essential hypertension, well controlled  - continue lisinopril 10mg  daily - follow-up at next in person appointment
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
## 2019-11-16 NOTE — Assessment & Plan Note (Signed)
Pt taking Trintellex 10mg  daily for her depression. States this medication has improved her mood and treated her depression better than any medication she had tried previously. Current life stressors include her non-healing abdominal wound from her colostomy reversal as she is needing to see surgery for wound care every 2 weeks. Pt is still limited in her ability to perform and enjoy activities secondary to her chronic pain, but overall feels improved from previous visits.  Assessment - MDD  - continue Trintellex 10mg  daily - follow-up PHQ-9 at next appointment

## 2019-11-17 NOTE — Progress Notes (Signed)
Internal Medicine Clinic Attending  Case discussed with Dr. Halfmann at the time of the visit.  We reviewed the resident's history and exam and pertinent patient test results.  I agree with the assessment, diagnosis, and plan of care documented in the resident's note.  Tericka Devincenzi, M.D., Ph.D.  

## 2019-11-18 ENCOUNTER — Telehealth: Payer: Self-pay | Admitting: *Deleted

## 2019-11-18 NOTE — Telephone Encounter (Signed)
t called upset about trintellix refill she picked up 3/24, states dr Ronnald Ramp called in wrong script, states she called it in 3/24 for 1/2 tab daily and they agreed 1 tablet daily. Nurse read script from 1/6 to her but she did not believe, ask her to hold and called pharm, spoke w/ pharmacist and alex will call pt and explain how the mix up occurred, pt ask if nurse is sure and pt was told yes that the pharmacist, alex would call her shortly. Ended call

## 2019-11-19 NOTE — Telephone Encounter (Signed)
Thanks Helen  

## 2019-11-20 ENCOUNTER — Other Ambulatory Visit: Payer: Self-pay | Admitting: Internal Medicine

## 2019-11-29 ENCOUNTER — Telehealth: Payer: Self-pay

## 2019-11-29 MED ORDER — MORPHINE SULFATE 30 MG PO TABS: 30.0000 mg | ORAL_TABLET | Freq: Four times a day (QID) | ORAL | 0 refills | Status: DC | PRN

## 2019-11-29 NOTE — Telephone Encounter (Signed)
[facility]

## 2019-11-29 NOTE — Telephone Encounter (Signed)
Received TC from pt.  She states her morphine IR is due to be filled today and [facility] [address] she was told they do not have it in stock and are not sure if they will get any tomorrow.  Pt is requesting the RX be [facility] a different [facility] and is requesting the nurse to call different [facility] pharmacies to see which pharmacy has this in stock. This nurse advised pt she will need to call [facility] and ask pharmacist if they have her RX in stock and when she finds a [facility] that carries her RX, to call triage nurse back and we will forward her request to the MD.  Pt also reminded that MD has 48 hours to respond to refill request.  Pt got angry and asking what is she supposed to do when she can't get her RX's filled before a certain date?   RN informed pt, this is a Building control surveyor. Pt states she will call back with pharmacy. SChaplin, RN,BSN

## 2019-11-29 NOTE — Telephone Encounter (Signed)
[facility] [place], [place]. 30 day script for 4/5 to 12/29/2019. Please cancel [facility] 1 of 3 script [facility] [address]. Thanks Stacee!

## 2019-11-29 NOTE — Telephone Encounter (Signed)
TC to [facility] [address] in [place], [place], spoke with [place].  Morphine RX 1 of 3 cancelled. SChaplin, RN,BSN

## 2019-11-29 NOTE — Telephone Encounter (Signed)
Dr. Ronnald Ramp, Please see the notes below.  I am assuming this will be for RX 1 of 3?   If you send this RX to [place], [place], please let me know and I will call the [facility] the original morphine RX was sent to and cancel RX 1 of 3. Thank you, Higinio Roger, RN,BSN

## 2019-11-29 NOTE — Telephone Encounter (Signed)
[facility] [place] has her medicine; pls contact [phone]

## 2019-12-14 ENCOUNTER — Encounter: Payer: Self-pay | Admitting: *Deleted

## 2020-01-27 ENCOUNTER — Ambulatory Visit (INDEPENDENT_AMBULATORY_CARE_PROVIDER_SITE_OTHER): Payer: PPO | Admitting: Internal Medicine

## 2020-01-27 ENCOUNTER — Encounter: Payer: Self-pay | Admitting: Internal Medicine

## 2020-01-27 VITALS — BP 108/78 | HR 83 | Temp 98.3°F | Ht 65.5 in | Wt 141.9 lb

## 2020-01-27 DIAGNOSIS — K631 Perforation of intestine (nontraumatic): Secondary | ICD-10-CM

## 2020-01-27 DIAGNOSIS — Z79891 Long term (current) use of opiate analgesic: Secondary | ICD-10-CM
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
## 2020-01-27 NOTE — Patient Instructions (Addendum)
Ms. Belisario,  It was nice seeing you again today! I am glad you are doing well.  The lump you are feeling between the scars on your stomach appear to be scar tissue, as seen on the ultrasound in the clinic. We will continue to monitor this in clinic. If you see any skin changes in this area, it rapidly grows, or you begin experiencing constipation or changes in bowel habits, then please let our clinic know.   Please follow-up in the clinic in July with your new primary provider.  Thank you for letting me be a part of your care!

## 2020-02-01 ENCOUNTER — Telehealth: Payer: Self-pay

## 2020-02-01 NOTE — Telephone Encounter (Signed)
Requesting to speak with a nurse about getting a refill on   morphine ([facility]IR) 30 MG tablet(Expired   Please call pt back.

## 2020-02-01 NOTE — Telephone Encounter (Signed)
Dr Ronnald Ramp... per Julie Williams at [address] pt picked up pain med 6/4, could you please call her?

## 2020-02-02 ENCOUNTER — Other Ambulatory Visit: Payer: PPO

## 2020-02-02 DIAGNOSIS — Z79891 Long term (current) use of opiate analgesic: Secondary | ICD-10-CM | POA: Diagnosis not present

## 2020-02-02 NOTE — Telephone Encounter (Signed)
Pt's here at the office - urine specimen given. Pt wants to know if Dr Ronnald Ramp could go ahead and write 3 month rx's for Morphine; then she will schedule an appt to see her new doctor by the 3 months instead July 1 as requested. Stated she has already been here twice recently and prefers to wait. Thanks

## 2020-02-07 ENCOUNTER — Telehealth: Payer: Self-pay | Admitting: Internal Medicine

## 2020-02-07 LAB — TOXASSURE SELECT,+ANTIDEPR,UR

## 2020-02-07 MED ORDER — MORPHINE SULFATE 30 MG PO TABS: 30.0000 mg | ORAL_TABLET | Freq: Four times a day (QID) | ORAL | 0 refills | Status: DC | PRN

## 2020-02-07 NOTE — Telephone Encounter (Signed)
Pt is calling regarding results [phone]

## 2020-02-07 NOTE — Telephone Encounter (Signed)
Pt would like to speak with the attending providers, please call pt back.

## 2020-02-07 NOTE — Telephone Encounter (Signed)
Returned call to pt at 1:35PM. She requests her 3 month Morphine prescription be sent in. States she will not come into the clinic in July to meet her new PCP. "I have done my part in giving a urine sample, and now you have to give me my prescriptions." Reiterated that the plan discussed with her after her last appointment on 6/3 was to defer a urine sample collection at this time (given it would not be random) and instead schedule an appointment July 1st or 2nd to get on a 3 month refill schedule with her new PCP. Pt was agreeable to that plan over the telephone at that time. However today pt states "that was your plan, not mine." Pt states she has tried calling the clinic multiple times to schedule an early July appointment. Says the front desk staff could not schedule an appointment as her new doctor had not been assigned. Explained that her new PCP, Dr. Marva Panda, was assigned in April and a letter was sent to the pt on 12/14/2019 (copy in chart under letters tab). Verified address on letter was correct, but pt states she never received it. Also no telephone encounters with front desk staff in chart. Offered to call clinic to schedule the pt's appointment for her. Pt became angry that she would have to come in for an appointment. Threatening to leave Internal Medicine Clinic and establish elsewhere. Offered to discuss the pt's concerns with supervising attending. Pt requests that and hangs up the phone.  Chart reviewed. Chronic morphine prescription last picked up on 01/28/2020 per PDMP, would give pt enough pills through 02/28/2020. Discussed situation with Dr. Daryll Drown, [place] Director. Dr. Daryll Drown recommended this provider prescribe an 7 day course of morphine to go through 03/06/2020 to give pt adequate time to schedule new follow-up appointment as discussed with the pt previously.  2:05PM - Called pt again. Explained plan for one week's extension in morphine prescription and July clinic  follow-up. Pt yelling stating "that is your plan and I will not come into clinic in July." Requests to speak to a supervisor, "I want to speak with him, and I will go up higher than that if need be." Instructed pt that Dr. Gilles Chiquito could give her a call back. Pt hangs up phone.

## 2020-02-07 NOTE — Assessment & Plan Note (Addendum)
Pt has concerns today regarding a "growing lump on my abdominal scar." States she has two areas under the skin which have been growing slowly in size. Pt says she was followed with surgery and wound care post-operatively after ostomy reversal, and her last appointment with them was in April. Denies pain at the site, overlying skin discoloration, changes in bowel or bladder habits, abdominal pain, nausea, or fever/chills at home. Endorsed one episode of vomiting last week. On examination, pt has a healed dry scar on her abdomen that is at the umbilicus and below. Two areas of more firm tissue appreciated to either side. One area measuring 3x5cm and the other 1x2cm. Edges feel well circumscribed. No underlying fluctuance or overlying skin changes. Non-reducible. Not tender to palpation. Pt concerned about an abscess vs cancer and asking for imaging options. Offered to perform POCUS to look at the tissue. Pt agreeable. POCUS exam noted these two areas to both be discrete hyperechoic tissue masses within the subcutaneous fat tissue layer. No vascularity with doppler. Appearance consistent with scar tissue.  Explained that findings on exam and ultrasound were not consistent with infection or a malignancy. Especially given pt's prolonged wound healing both after ex-lap and ostomy reversal, the areas she is feeling are both likely underlying scar tissue.   - continue to monitor - instructed pt to reach out if there are any skin changes or growth to the area, or pt experiences constipation, changes in bowel habits, or new/worsening abdominal pain

## 2020-02-07 NOTE — Telephone Encounter (Signed)
Call was transferred to me from our nursing staff earlier today.  Julie Williams was wanting to speak to a supervising physician about her chronic opioid prescription.  I was able to briefly review some of her recent chart.  Dr. Ronnald Ramp has been her PCP recently saw her in June.  However wanted her back in July to see her new primary care physician and possibly obtain a repeat urine drug screen.  Julie Williams was very repetitive and interrupted me quite frequently.  I mainly determine from her that she has been on chronic opioids for many years now.  She feels that she has been persecuted and treated unfairly I have not come back in an interval that is shorter than 3 months.  I discussed with her that I would like to get more information about this and I would call her back.  I was able to speak with our clinic director and program director Dr. Daryll Drown who is spoken with Dr. Ronnald Ramp.  I also spoke with Dr. Tobi Bastos, who recently supervised Dr. Ronnald Ramp this patient.  I learned that while her urine drug screen was appropriate for morphine and the metabolites of morphine of which she is prescribed, she did have a red flag that was she did not provide a urine sample when requested in the office and brought back a sample a few days later.  Dr. Venia Minks plan was to provide a 7-day prescription at the beginning of July to get her to the next visit.   I called back Julie Williams and discussed with her my findings.  Her opioid prescription contract does require more frequent visits at times than every 3 months.  And that it is within reason to have her come back sooner when it is requested.  She is on a high dose of morphine and we do need to be thoughtful and safe with the prescribing of this medication.  She also related a bad experience where her pharmacy had run out of morphine or "given to someone else," I discussed with her that it would be within reason to prescribe a 1 month course at the beginning of July to allow her to schedule  that appointment in July.  She did not feel this was an acceptable plan at first.  She stated that we were not providing her appropriate care she discussed multiple previous episodes of not getting surgery when she needed it of which I told her that had no control over those issues.  I did reiterate that we want to provide her appropriate care.  She mentioned multiple times over a conversation that it was difficult to get in for appointments because she lives over an hour away.  I discussed with her would be fine for her to find a another physician that is closer to her home but that we still need to provide appropriate care.  She stated that she cannot "believe you are doing this during the pandemic" and I discussed that while I do understand we have had to make many concessions during the COVID-19 pandemic that I do want to continue appropriate care which does include frequent monitoring of controlled substance medications.  At multiple times she did use some inappropriate language, towards the end of the call I did state that I thought she was being rude and inappropriate and that she cannot continue to act in such a way.  I reiterated that we were trying to accommodate her.  And she very clearly stated she did not feel that  way.  I reiterated again my plan to provide her a prescription for the month of July, and that she would need to schedule a follow-up visit in person with [place] before the end of July.  She said fine but did not really seem to agree when I asked her if she agreed to the plan she said that is your plan.  So I clearly stated yes this is our plan the expectation I am setting on her is to schedule an in person visit before the end of July to continue her controlled substance medication agreement.  CC: Dr Basil Dess, and Daryll Drown

## 2020-02-07 NOTE — Assessment & Plan Note (Signed)
Pt on chronic morphine 30mg  q6h PRN. States today that her pain is "manageable" with her medications. PDMP reviewed and appropriated. Pt picked up prescription 2 of 3 written in March on 12/29/2019. Prescriptions available through 02/28/2020. Asked pt to provide urine tox screen in clinic today. Pt unable to provide a sample as she used the restroom before coming into clinic. Drank a water bottle in the exam room. Attempted to urinate in the restroom afterwards and was unable. Had to leave clinic in order to make another appointment later in the afternoon. Pt stated she would return to give a sample in clinic later.  - discussed appointment with attending, Dr. Philipp Ovens, and [facility] Medical Director, Dr. Lynnae January - recommended pt schedule appointment on July 1st or 2nd with new PCP, Dr. Marva Panda before next 30 day supply is due - review pt's pain contract (available in Media tab from 10/17/2015) at that visit as it outlines pt must provide urine sample when asked  Called pt later that evening around 4:30PM. Explained that pt need not return to clinic to provide a urine sample only and instead call next week to schedule an appointment with her new PCP in July. Discussed that the clinic provider's schedule is changing come July 1 and her PCP will now be working a one-month long rotation in the [facility] every three months, meaning the pt could see her same provider every three months for new prescriptions much easier than the current 0.5 day per week schedule. Pt asked about availability. Stated that Dr. Marva Panda has multiple open clinic appointments in both the morning and afternoons on July 1st and 2nd. Pt agreeable to this plan stating it would be easier for her as well since she drives from [facility] to get to the clinic. Pt to call [facility] early next week.

## 2020-02-08 NOTE — Progress Notes (Signed)
Internal Medicine Clinic Attending  Case discussed with Dr. Ronnald Ramp at the time of the visit.  We reviewed the resident's history and exam and pertinent patient test results.  I agree with the assessment, diagnosis, and plan of care documented in the resident's note.   Patient on chronic morphine and has not provided a urine sample for Utox in over 2 years (January of 2019). Apparently has been unable to provide a urine sample at previous visits for a variety of reasons. Today, she was unable to provide a sample despite being given water and asked to wait. She had to leave clinic to make another appointment. This is a red flag. Dr. Ronnald Ramp has reviewed her controlled substance contract with her and reminded her that she must provide a urine when asked.

## 2020-03-01 ENCOUNTER — Encounter: Payer: Self-pay | Admitting: Internal Medicine

## 2020-03-01 ENCOUNTER — Other Ambulatory Visit: Payer: Self-pay

## 2020-03-01 ENCOUNTER — Ambulatory Visit (INDEPENDENT_AMBULATORY_CARE_PROVIDER_SITE_OTHER): Payer: PPO | Admitting: Internal Medicine

## 2020-03-01 VITALS — BP 121/86 | HR 94 | Temp 99.3°F | Ht 65.5 in | Wt 138.3 lb

## 2020-03-01 DIAGNOSIS — Z79891 Long term (current) use of opiate analgesic: Secondary | ICD-10-CM

## 2020-03-01 DIAGNOSIS — D649 Anemia, unspecified: Secondary | ICD-10-CM

## 2020-03-01 DIAGNOSIS — I10 Essential (primary) hypertension: Secondary | ICD-10-CM

## 2020-03-01 DIAGNOSIS — D638 Anemia in other chronic diseases classified elsewhere: Secondary | ICD-10-CM

## 2020-03-01 DIAGNOSIS — K631 Perforation of intestine (nontraumatic): Secondary | ICD-10-CM

## 2020-03-01 DIAGNOSIS — E039 Hypothyroidism, unspecified: Secondary | ICD-10-CM | POA: Diagnosis not present

## 2020-03-01 DIAGNOSIS — F17200 Nicotine dependence, unspecified, uncomplicated: Secondary | ICD-10-CM | POA: Diagnosis not present

## 2020-03-01 DIAGNOSIS — G894 Chronic pain syndrome: Secondary | ICD-10-CM

## 2020-03-01 DIAGNOSIS — K439 Ventral hernia without obstruction or gangrene: Secondary | ICD-10-CM | POA: Diagnosis not present

## 2020-03-01 MED ORDER — MORPHINE SULFATE 30 MG PO TABS: 30.0000 mg | ORAL_TABLET | Freq: Four times a day (QID) | ORAL | 0 refills | Status: DC | PRN
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
## 2020-03-01 NOTE — Patient Instructions (Addendum)
Julie Williams,  It was a pleasure seeing you in clinic. Today we discussed:   Chronic pain:  Continue morphine 30mg  q6h prn  Nonreducible hernia:  I will place referral to surgery for this.   I will do some lab tests today and inform you of any abnormal results. Please continue to take all medications as prescribed.   If you have any questions or concerns, please call our clinic at [phone] between 9am-5pm and after hours call [phone] and ask for the internal medicine resident on call. If you feel you are having a medical emergency please call 911.   Thank you, we look forward to helping you remain healthy!   If you have not already done so, I recommend getting your COVID 19 vaccine.  To schedule an appointment for a COVID vaccine or be added to the vaccine wait list: Go to [email or web address]   OR Go to [email or web address]                  OR Call [phone]                                     OR Call [phone] and select Option 2

## 2020-03-02 ENCOUNTER — Telehealth: Payer: Self-pay | Admitting: Internal Medicine

## 2020-03-02 LAB — CBC
Hematocrit: 44.9 % (ref 34.0–46.6)
Hemoglobin: 15.2 g/dL (ref 11.1–15.9)
MCH: 33.5 pg — ABNORMAL HIGH (ref 26.6–33.0)
MCHC: 33.9 g/dL (ref 31.5–35.7)
MCV: 99 fL — ABNORMAL HIGH (ref 79–97)
Platelets: 242 10*3/uL (ref 150–450)
RBC: 4.54 x10E6/uL (ref 3.77–5.28)
RDW: 12.1 % (ref 11.7–15.4)
WBC: 7.5 10*3/uL (ref 3.4–10.8)

## 2020-03-02 LAB — BMP8+ANION GAP
Anion Gap: 15 mmol/L (ref 10.0–18.0)
BUN/Creatinine Ratio: 19 (ref 9–23)
BUN: 18 mg/dL (ref 6–24)
CO2: 23 mmol/L (ref 20–29)
Calcium: 9.7 mg/dL (ref 8.7–10.2)
Chloride: 101 mmol/L (ref 96–106)
Creatinine, Ser: 0.94 mg/dL (ref 0.57–1.00)
GFR calc Af Amer: 78 mL/min/{1.73_m2} (ref >59)
GFR calc non Af Amer: 68 mL/min/{1.73_m2} (ref >59)
Glucose: 101 mg/dL — ABNORMAL HIGH (ref 65–99)
Potassium: 4.4 mmol/L (ref 3.5–5.2)
Sodium: 139 mmol/L (ref 134–144)

## 2020-03-02 LAB — TSH: TSH: 3.71 u[IU]/mL (ref 0.450–4.500)

## 2020-03-02 MED ORDER — VARENICLINE TARTRATE 0.5 MG PO TABS: 0.5000 mg | ORAL_TABLET | Freq: Two times a day (BID) | ORAL | 0 refills | Status: DC

## 2020-03-02 NOTE — Assessment & Plan Note (Signed)
Patient expresses concerns regarding worsening of the "lump on my abdominal scar". She notes that this has been worsening since her ostomy reversal in December 2020 and was last seen by surgery and wound care in April 2021. She notes some mild tenderness but denies any changes in the overlying skin discoloration. She does endorse some episodes of diarrhea and emesis that is "more than once a week" but unable to quantify how often this happens. On examination, appears to have well healed dry scar on the abdomen from umbilicus and below. Areas of firm tissue appreciated surrounding the scar sites that are non-reducible with well-cicumscribed edges. No significant tenderness to palpation noted. Possibly non-reducible hernia without evidence of strangulation. On chart review, this was reviewed at her last visit and evaluated with POCUS and noted to be hyperechoic tissue masses within the subcutaneous fat tissue layer consistent with scar tissue. Patient expresses that she believes there is something growing and would like further imaging and evaluation of this. She does not wish to return to [facility] or any physician in [place].  Plan:  Referral to general surgery Continue to monitor for any further growth or changes in bowel habits/abdominal pain

## 2020-03-02 NOTE — Assessment & Plan Note (Signed)
Patient notes that she quit smoking previously for the past three years but started again over the past year due to stress from the pandemic. She is currently smoking up to 1/2 ppd. She notes trying chantix in the past and has worked well with helping her smoking cessation.   Plan: Chantix prescription sent to pharmacy

## 2020-03-02 NOTE — Assessment & Plan Note (Signed)
This is chronic and stable. She is taking lisinopril 10mg  daily. Does not report any side effects from this. Renal function stable.   Plan: Continue lisinopril 10mg  daily

## 2020-03-02 NOTE — Assessment & Plan Note (Addendum)
Patient is on chronic opiate therapy for her chronic pain syndrome. She is on morphine 30mg  q6h prn and per PDMP review, appropriately refilling. Patient requires this to function daily.   Plan: Continue morphine 30mg  q6h prn UTox today

## 2020-03-02 NOTE — Telephone Encounter (Signed)
Sent!

## 2020-03-02 NOTE — Telephone Encounter (Signed)
Refill request   Chantix  [id number] - [place], [place] [address].

## 2020-03-02 NOTE — Assessment & Plan Note (Signed)
Patient with history of hypothyroidism on levothyroxine 67mcg daily. She endorses medication compliance and denies any acute concerns. TSH at this visit wnl.  Plan: Continue levothyroxine 32mcg daily before breakfast

## 2020-03-03 NOTE — Progress Notes (Signed)
Internal Medicine Clinic Attending ° °Case discussed with Dr. Aslam  At the time of the visit.  We reviewed the resident’s history and exam and pertinent patient test results.  I agree with the assessment, diagnosis, and plan of care documented in the resident’s note.  °

## 2020-03-05 LAB — TOXASSURE SELECT,+ANTIDEPR,UR

## 2020-03-21 DIAGNOSIS — K439 Ventral hernia without obstruction or gangrene: Secondary | ICD-10-CM | POA: Diagnosis not present

## 2020-03-21 DIAGNOSIS — G8929 Other chronic pain: Secondary | ICD-10-CM | POA: Diagnosis not present

## 2020-03-21 DIAGNOSIS — I10 Essential (primary) hypertension: Secondary | ICD-10-CM | POA: Diagnosis not present

## 2020-03-26 ENCOUNTER — Other Ambulatory Visit: Payer: Self-pay | Admitting: Internal Medicine

## 2020-03-29 ENCOUNTER — Other Ambulatory Visit: Payer: Self-pay | Admitting: Internal Medicine

## 2020-03-29 ENCOUNTER — Telehealth: Payer: Self-pay | Admitting: Internal Medicine

## 2020-03-29 MED ORDER — MORPHINE SULFATE 30 MG PO TABS: 30.0000 mg | ORAL_TABLET | Freq: Four times a day (QID) | ORAL | 0 refills | Status: DC | PRN

## 2020-03-29 NOTE — Telephone Encounter (Signed)
Pt requesting a callback regarding medicine [phone]

## 2020-03-29 NOTE — Telephone Encounter (Signed)
New script sent in that can be filled today. Please contact pharmacy to void all other Morphine scripts currently at pharmacy. To avoid this confusion in the future, please have pharmacy send prescription request every month.   Thank you!

## 2020-03-29 NOTE — Telephone Encounter (Signed)
Call from pt - states the pharmacy does not have rx for her pain medication which is scheduled to be refilled today on the 4th. Talked to Dr Charleen Kirks - placed new rx for Mophine - to be filled today.  Pt informed of refill.  [facility], stated he will cancel all Morphine rxs (including one dated 05/02/20) and only fill the one just sent today by Dr Charleen Kirks.

## 2020-03-29 NOTE — Telephone Encounter (Signed)
Triage called pharmacy, pt last fill of morphine was 02/27/20, the script on file states do not fill til 8/7. Pt is upset. Per pharmacist we will need to send a new script. Please do, pharmacist states pt is not happy.

## 2020-03-30 DIAGNOSIS — K469 Unspecified abdominal hernia without obstruction or gangrene: Secondary | ICD-10-CM | POA: Diagnosis not present

## 2020-03-30 DIAGNOSIS — R935 Abnormal findings on diagnostic imaging of other abdominal regions, including retroperitoneum: Secondary | ICD-10-CM | POA: Diagnosis not present

## 2020-03-30 DIAGNOSIS — K439 Ventral hernia without obstruction or gangrene: Secondary | ICD-10-CM | POA: Diagnosis not present

## 2020-03-30 DIAGNOSIS — G894 Chronic pain syndrome: Secondary | ICD-10-CM | POA: Diagnosis not present

## 2020-03-30 DIAGNOSIS — F17200 Nicotine dependence, unspecified, uncomplicated: Secondary | ICD-10-CM | POA: Diagnosis not present

## 2020-03-30 DIAGNOSIS — K760 Fatty (change of) liver, not elsewhere classified: Secondary | ICD-10-CM | POA: Diagnosis not present

## 2020-03-30 DIAGNOSIS — K432 Incisional hernia without obstruction or gangrene: Secondary | ICD-10-CM | POA: Diagnosis not present

## 2020-03-30 DIAGNOSIS — Z9889 Other specified postprocedural states: Secondary | ICD-10-CM | POA: Diagnosis not present

## 2020-03-30 DIAGNOSIS — I700 Atherosclerosis of aorta: Secondary | ICD-10-CM | POA: Diagnosis not present

## 2020-03-30 DIAGNOSIS — I10 Essential (primary) hypertension: Secondary | ICD-10-CM | POA: Diagnosis not present

## 2020-04-30 ENCOUNTER — Other Ambulatory Visit: Payer: Self-pay | Admitting: Student

## 2020-04-30 MED ORDER — MORPHINE SULFATE 30 MG PO TABS: 30.0000 mg | ORAL_TABLET | Freq: Four times a day (QID) | ORAL | 0 refills | Status: DC | PRN

## 2020-04-30 NOTE — Progress Notes (Unsigned)
Patient called having run out of morphine prescription. I reviewed PDMP and toxisure and prescribed 1 month supply to her preferred pharmacy.

## 2020-05-29 ENCOUNTER — Other Ambulatory Visit: Payer: Self-pay

## 2020-05-29 ENCOUNTER — Encounter: Payer: Self-pay | Admitting: Internal Medicine

## 2020-05-29 ENCOUNTER — Ambulatory Visit (INDEPENDENT_AMBULATORY_CARE_PROVIDER_SITE_OTHER): Payer: PPO | Admitting: Internal Medicine

## 2020-05-29 DIAGNOSIS — I10 Essential (primary) hypertension: Secondary | ICD-10-CM | POA: Diagnosis not present

## 2020-05-29 DIAGNOSIS — Z79891 Long term (current) use of opiate analgesic: Secondary | ICD-10-CM

## 2020-05-29 DIAGNOSIS — Z9049 Acquired absence of other specified parts of digestive tract: Secondary | ICD-10-CM

## 2020-05-29 DIAGNOSIS — K631 Perforation of intestine (nontraumatic): Secondary | ICD-10-CM | POA: Diagnosis not present

## 2020-05-29 DIAGNOSIS — L659 Nonscarring hair loss, unspecified: Secondary | ICD-10-CM

## 2020-05-29 DIAGNOSIS — E039 Hypothyroidism, unspecified: Secondary | ICD-10-CM

## 2020-05-29 MED ORDER — LISINOPRIL 10 MG PO TABS: 10.0000 mg | ORAL_TABLET | Freq: Every day | ORAL | 11 refills | Status: DC

## 2020-05-29 MED ORDER — MORPHINE SULFATE 30 MG PO TABS: 30.0000 mg | ORAL_TABLET | ORAL | 0 refills | Status: DC | PRN

## 2020-05-29 MED ORDER — VORTIOXETINE HBR 10 MG PO TABS: 10.0000 mg | ORAL_TABLET | Freq: Every day | ORAL | 1 refills | Status: DC

## 2020-05-29 MED ORDER — LISINOPRIL 20 MG PO TABS: 20.0000 mg | ORAL_TABLET | Freq: Every day | ORAL | 11 refills | Status: DC

## 2020-05-29 MED ORDER — LEVOTHYROXINE SODIUM 25 MCG PO TABS: 25.0000 ug | ORAL_TABLET | Freq: Every day | ORAL | 2 refills | Status: DC

## 2020-05-29 NOTE — Progress Notes (Signed)
Copy of Pain Contract given to pt. 
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
## 2020-05-29 NOTE — Patient Instructions (Addendum)
Ms Queener,  It was a pleasure seeing you in clinic. Today we discussed:   [facility] We renewed your pain contract today. I will see you in 3 months.  To supplement the morphine, you may try using an abdominal binder. You may also try alternating between tylenol 1000mg  and ibuprofen 600mg  for additional pain coverage At your next visit, we can discuss adding on a long term opiate in addition to the morphine.   If you have any questions or concerns, please call our clinic at [phone] between 9am-5pm and after hours call [phone] and ask for the internal medicine resident on call. If you feel you are having a medical emergency please call 911.   Thank you, we look forward to helping you remain healthy!   If you have not already done so, I recommend getting the COVID 19 vaccine.  To schedule an appointment for a COVID vaccine or be added to the vaccine wait list: Go to [email or web address]   OR Go to [email or web address]                  OR Call [phone]                                     OR Call [phone] and select Option 2

## 2020-05-30 NOTE — Assessment & Plan Note (Signed)
Patient previously well controlled on lisinopril 10mg  daily. She was noted to have BP 160/97 at this visit, repeat 146/100. She endorses dietary and medication compliance. She denies any headaches, vision changes, chest pain, dyspnea or focal weakness. Renal function at her last visit was stable.  Plan:  Increase lisinopril to 20mg  daily F/u in 4 weeks for BP check and BMP

## 2020-05-30 NOTE — Assessment & Plan Note (Signed)
Patient was reversal in December 2020 and last evaluated by wound care in April 2021.  Her last visit, patient expressed concerns regarding worsening of the "lump in my abdominal scar'.  However, did not wish to return to [facility] surgery or physician [facility].  Patient was referred to [facility] and was evaluated by Dr. Dema Severin.  CT abdomen pelvis revealed scar tissue and fat necrosis.  No incarcerated hernia was seen on a CT imaging and patient recommended for conservative management.  On examination, stable 5 cm knots noted at the periumbilical region, stable from prior exam.  She feels like the "lumps" on her abdomen continue to grow and are causing her discomfort.  She endorses chronic episode of intermittent diarrhea and constipation that have been present since her surgery.  She does not wish to get evaluated by GI at this time.   Plan Continue to monitor for further growth or changes in abdominal pain/bowel habits Recommend for abdominal binder to decrease pressure

## 2020-05-30 NOTE — Assessment & Plan Note (Signed)
Levothyroxine 55mcg daily before breakfast refilled at this visit.

## 2020-05-30 NOTE — Assessment & Plan Note (Addendum)
Patient is on chronic morphine 30mg  q4h prn. She notes that she has been on her current regimen and her pain is currently "tolerable" but not as well controlled as previously. She notes that she is barely able to perform her daily tasks. Concern for tolerance of morphine given her chronic use. Discussed supplementing with alternating regimen of tylenol/ibuprofen which patient endorses that she is already doing. Discussed possible addition of a long-acting opioid that patient would supplement with morphine prn. She expresses interest but would like to think about this further.  If patient is agreeable in the future, would recommend for fentanyl patch with morphine as needed.  Patient's previous pain contract was signed in 2017.  We readdressed her pain contract and signed an updated copy.  This has been given to patient and also placed in patient's chart.   -Continue morphine 30 mg every 4 hours as needed -Consider addition of long-acting opiate to decrease her morphine dependence

## 2020-06-01 ENCOUNTER — Telehealth: Payer: Self-pay

## 2020-06-01 NOTE — Telephone Encounter (Signed)
Returned call to patient. She is asking to only come in for lab work appointment and get her BP checked at that time. She notes that she has been taking her increased dose of lisinopril and has had SBP in 110-130s based on home BP readings. Discussed that there can be variations between home BP monitor and office monitors.   Patient is also requesting further information regarding adjustment of her chronic pain regimen and possibility of adding a long acting opiate. Discussed options of possible addition of MS Contin vs Oxycontin every 12 hours. She expresses interest. Will discuss further with attending for medication adjustments.  Advised patient that she should still keep the in-person office visit in case any medication adjustments needed to be made. She is hesitant but agreeable with the plan.

## 2020-06-01 NOTE — Telephone Encounter (Signed)
Received TC from patient who states she was just in the clinic on 05/29/20 and her b/p medication was increased (saw Dr. Marva Panda).  She has a return f/u appt on 06/19/20 and states she wants to know  "is it really necessary that I see the MD?  Can I just have my labwork done"?   RN informed patient per LOV note she is to return for a b/p check and labs.  Pt states "I'm not going to be coming up there all of the time.  I can check my b/p here at home".  RN informed patient that home monitors are not always accurate and it was recommended she return for b/p check in 4 weeks to compare to b/p in clinic at [place].    Patient wants RN to send MD a message and ask MD if she has to be seen in office next visit, states it's ridiculous and she's not going to keep calling [facility] back for an answer.  RN questioned patient if she has tried reaching [facility] because this is the only phone call that is noted per chart review since her LOV.  Patient states no, this is the only time she's called. Pt argumentative on phone. Will forward to MD to advise on NOV. SChaplin, RN,BSN

## 2020-06-01 NOTE — Progress Notes (Signed)
Internal Medicine Clinic Attending ° °Case discussed with Dr. Aslam  At the time of the visit.  We reviewed the resident’s history and exam and pertinent patient test results.  I agree with the assessment, diagnosis, and plan of care documented in the resident’s note.  °

## 2020-06-19 ENCOUNTER — Other Ambulatory Visit: Payer: Self-pay

## 2020-06-19 ENCOUNTER — Ambulatory Visit (INDEPENDENT_AMBULATORY_CARE_PROVIDER_SITE_OTHER): Payer: PPO | Admitting: Internal Medicine

## 2020-06-19 VITALS — BP 141/102 | HR 97 | Temp 99.5°F | Ht 65.0 in | Wt 145.0 lb

## 2020-06-19 DIAGNOSIS — M549 Dorsalgia, unspecified: Secondary | ICD-10-CM

## 2020-06-19 DIAGNOSIS — I10 Essential (primary) hypertension: Secondary | ICD-10-CM | POA: Diagnosis not present

## 2020-06-19 DIAGNOSIS — Z79891 Long term (current) use of opiate analgesic: Secondary | ICD-10-CM | POA: Diagnosis not present

## 2020-06-19 DIAGNOSIS — F17200 Nicotine dependence, unspecified, uncomplicated: Secondary | ICD-10-CM
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
## 2020-06-19 NOTE — Patient Instructions (Signed)
Julie Williams,  It was a pleasure seeing you in clinic. Today we discussed:   Hypertension: Continue taking your lisinopril 20mg  daily at home and checking your blood pressures. Please bring your blood pressure machine to your next visit. I am checking labs at this visit. I will call you with the results   Back pain radiating to shoulder: As we discussed, I am concerned this may be related to your aorta. I would like to obtain further imaging for this.  If you have any questions or concerns, please call our clinic at [phone] between 9am-5pm and after hours call [phone] and ask for the internal medicine resident on call. If you feel you are having a medical emergency please call 911.   Thank you, we look forward to helping you remain healthy!

## 2020-06-20 ENCOUNTER — Encounter: Payer: Self-pay | Admitting: Internal Medicine

## 2020-06-20 DIAGNOSIS — M549 Dorsalgia, unspecified: Secondary | ICD-10-CM | POA: Insufficient documentation

## 2020-06-20 LAB — BMP8+ANION GAP
Anion Gap: 15 mmol/L (ref 10.0–18.0)
BUN/Creatinine Ratio: 19 (ref 9–23)
BUN: 16 mg/dL (ref 6–24)
CO2: 24 mmol/L (ref 20–29)
Calcium: 10 mg/dL (ref 8.7–10.2)
Chloride: 101 mmol/L (ref 96–106)
Creatinine, Ser: 0.83 mg/dL (ref 0.57–1.00)
GFR calc Af Amer: 91 mL/min/{1.73_m2} (ref >59)
GFR calc non Af Amer: 79 mL/min/{1.73_m2} (ref >59)
Glucose: 123 mg/dL — ABNORMAL HIGH (ref 65–99)
Potassium: 5.5 mmol/L — ABNORMAL HIGH (ref 3.5–5.2)
Sodium: 140 mmol/L (ref 134–144)

## 2020-06-20 MED ORDER — AMLODIPINE BESYLATE 10 MG PO TABS
10.0000 mg | ORAL_TABLET | Freq: Every day | ORAL | 11 refills | Status: DC
Start: 2020-06-20 — End: 2020-07-10

## 2020-06-20 NOTE — Assessment & Plan Note (Signed)
Currently smoking up to 1/2 ppd. Previously, Chantix has worked for her for smoking cessation. However, this was re-called recently for concerns of carcinogenic components. Patient is not currently interested in wellbutrin.   Plan:  Continue to encourage smoking cessation

## 2020-06-20 NOTE — Assessment & Plan Note (Signed)
Ms Jarrard endorses up to three episodes of central back pain radiating to her right shoulder over the past month that resolved with aspirin. She did not check her BP during these episodes. Given her history of hypertension and chronic tobacco use, concern for possible aortic aneurysm. Would like to evaluate this with [place] of aorta and also CT Chest. However, patient is not agreeable to this at this time. She notes that there is high financial burden with these exams. I explained to her that this could be a medical emergency and I would like to obtain these studies to rule out any life-threatening causes of her back pain. Patient is currently adamant that she does not want further testing. She expresses frustration that prior testing, including stress test, was negative for acute abnormalities.  Differential includes aortic aneurysm vs acute cholecystitis vs pancreatitis vs coronary artery disease  BP in R arm 138/103 pulse 87; BP in L arm 134/104 pulse 89; bilateral posterior tibial pulses intact. Denies any headaches, lightheadedness, palpitations, fevers/chills, relation of pain with food.   Plan: - Advised patient that if this recurs, would recommend more emergent evaluation  - Will continue to encourage patient for screening abdominal US

## 2020-06-20 NOTE — Assessment & Plan Note (Signed)
Revisited patient's chronic opiate use at this visit. She is currently on morphine 30mg  q4h prn with which pain is described as "tolerable". At the last visit, patient was advised to use an abdominal binder which she notes she has been using and that has helped with the pain; however, endorses that it is uncomfortable. Discussed option of starting suboxone for chronic pain therapy; however, this would require at least a mild detox from her current morphine therapy. She is hesitant and would like to continue with her current regimen.   Plan: - Continue morphine 30mg  q4h prn  - Patient has refills ordered through month of November and December - F/u in 3 months

## 2020-06-20 NOTE — Assessment & Plan Note (Signed)
BP Readings from Last 3 Encounters:  06/19/20 (!) 141/102  05/29/20 (!) 146/100  03/01/20 121/86   Patient is presenting for follow up of her hypertension. At the last visit, patient was noted to have BP of 160/97 with repeat of 146/100 for which her lisinopril was increased to 20mg  daily. She has been keeping a BP log at home and has noted SBP ranging from 96-138 and DBP ranging 80s-100s. She denies any headaches, vision changes, chest pain, dyspnea or focal weakness. BMP at this visit with stable renal function but does have hyperkalemia to 5.5. At this time, will switch to amlodipine.  Plan: Discontinue lisinopril 20mg  daily Start amlodipine 10mg  daily F/u in 4 weeks for BP check and BMP check Patient advised to bring blood pressure monitor to next visit for comparison of readings

## 2020-06-20 NOTE — Progress Notes (Signed)
Attempted to contact patient regarding BMP. Stable renal function; however, noted to have hyperkalemia following recent increase in lisinopril. At this time, will discontinue lisinopril 20mg  daily and will start on amlodipine 10mg  daily. Will have patient follow up in 2 weeks for BP and BMP check.  Called patient to discuss plan; however, unable to reach at this time. Left voicemail.

## 2020-06-21 ENCOUNTER — Encounter: Payer: Self-pay | Admitting: *Deleted

## 2020-06-21 NOTE — Progress Notes (Unsigned)
Patient stated she missed your call yesterday to give her results and ask that you return the call to her today with the results.

## 2020-06-21 NOTE — Progress Notes (Signed)
Returned patient's call. We discussed that her most recent labs with hyperkalemia. Recommend to discontinue lisinopril and start amlodipine 10mg  daily at this time. Also advised patient that will need f/u labs in 2 weeks. She would like a lab appointment on November 15th before 11AM. Can we please schedule her for this?  Thank you!

## 2020-06-22 NOTE — Progress Notes (Signed)
Internal Medicine Clinic Attending  Case discussed with Dr. Marva Panda  At the time of the visit.  We reviewed the resident's history and exam and pertinent patient test results.  I agree with the assessment, diagnosis, and plan of care documented in the resident's note.  The nature of Julie Williams back pain (intermittent, full symmetric peripheral pulses) has low likelihood of being an aortic dissection, though Dr. Marva Panda has appropriately discussed with her the importance of seeking emergent care if this pain should worsen significantly.

## 2020-07-10 ENCOUNTER — Other Ambulatory Visit: Payer: PPO

## 2020-07-10 ENCOUNTER — Other Ambulatory Visit: Payer: Self-pay | Admitting: *Deleted

## 2020-07-10 ENCOUNTER — Other Ambulatory Visit: Payer: Self-pay | Admitting: Student

## 2020-07-10 DIAGNOSIS — E875 Hyperkalemia: Secondary | ICD-10-CM

## 2020-07-10 MED ORDER — AMLODIPINE BESYLATE 10 MG PO TABS: 10.0000 mg | ORAL_TABLET | Freq: Every day | ORAL | 3 refills | Status: DC

## 2020-07-10 MED ORDER — OMEPRAZOLE 40 MG PO CPDR
40.0000 mg | DELAYED_RELEASE_CAPSULE | Freq: Every day | ORAL | 3 refills | Status: DC
Start: 2020-07-10 — End: 2020-08-29

## 2020-07-11 ENCOUNTER — Other Ambulatory Visit: Payer: PPO

## 2020-08-29 ENCOUNTER — Encounter: Payer: Self-pay | Admitting: Internal Medicine

## 2020-08-29 ENCOUNTER — Telehealth: Payer: Self-pay

## 2020-08-29 ENCOUNTER — Ambulatory Visit (INDEPENDENT_AMBULATORY_CARE_PROVIDER_SITE_OTHER): Payer: Medicare HMO | Admitting: Internal Medicine

## 2020-08-29 ENCOUNTER — Other Ambulatory Visit: Payer: Self-pay

## 2020-08-29 ENCOUNTER — Telehealth: Payer: Self-pay | Admitting: *Deleted

## 2020-08-29 VITALS — BP 148/107 | HR 86 | Temp 98.5°F | Ht 65.0 in | Wt 145.4 lb

## 2020-08-29 DIAGNOSIS — Z79891 Long term (current) use of opiate analgesic: Secondary | ICD-10-CM | POA: Diagnosis not present

## 2020-08-29 DIAGNOSIS — I10 Essential (primary) hypertension: Secondary | ICD-10-CM

## 2020-08-29 DIAGNOSIS — L659 Nonscarring hair loss, unspecified: Secondary | ICD-10-CM

## 2020-08-29 MED ORDER — OMEPRAZOLE 40 MG PO CPDR
40.0000 mg | DELAYED_RELEASE_CAPSULE | Freq: Every day | ORAL | 3 refills | Status: DC
Start: 2020-08-29 — End: 2020-09-28

## 2020-08-29 MED ORDER — MORPHINE SULFATE 30 MG PO TABS
30.0000 mg | ORAL_TABLET | ORAL | 0 refills | Status: DC | PRN
Start: 2020-08-29 — End: 2020-09-21

## 2020-08-29 MED ORDER — VORTIOXETINE HBR 10 MG PO TABS
10.0000 mg | ORAL_TABLET | Freq: Every day | ORAL | 1 refills | Status: DC
Start: 2020-08-29 — End: 2021-01-18

## 2020-08-29 MED ORDER — LEVOTHYROXINE SODIUM 25 MCG PO TABS: 25.0000 ug | ORAL_TABLET | Freq: Every day | ORAL | 2 refills | Status: DC

## 2020-08-29 MED ORDER — AMLODIPINE BESYLATE 10 MG PO TABS: 10.0000 mg | ORAL_TABLET | Freq: Every day | ORAL | 3 refills | Status: DC

## 2020-08-29 NOTE — Telephone Encounter (Signed)
Call from pt - stated she received a text message from [facility] for the doctor to send in new alternative rx for morphine.  [facility] called [facility] - the pharmacist stated the pt must have new insurance or a new plan b/c they are requiring a PA or she can only get a 7 day supply w/o the PA; or alternative.  I will send to Dr Chesley Mires and Angelina Ok RN. Pt was also informed - she stated she's currently talking to [facility] who told her PA is not needed, then she put me on hold then disconnected.

## 2020-08-29 NOTE — Telephone Encounter (Signed)
  morphine (MSIR) 30 MG tablet, NEEDS PA.

## 2020-08-29 NOTE — Patient Instructions (Signed)
Ms. Claytor,  It was nice seeing you.   I have sent in your [facility] to [facility] so you can pick up today. I will send the other two to [facility] pharmacy as requested, along with your other medications.   Reviewing your blood pressure home log, the readings are looking good. I will not make any adjustments today.  I will let you know how your potassium level looks once I have the result.   Take care, Dr. Chesley Mires

## 2020-08-29 NOTE — Telephone Encounter (Addendum)
Information was faxed to [employer] for PA for Morphine 30 mg tablets #120 tablets.  Awaiting determination.  Angelina Ok, RN 08/29/2020 4:40 PM. PA was denied by Lillie Columbia as patient has [employer] which covers her Medications.  PA information was sent to [employer] and was approved 09/12/2020 thru 08/25/2021.  Angelina Ok, RN 09/14/2020 10:41 AM.
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
## 2020-08-30 ENCOUNTER — Encounter: Payer: Self-pay | Admitting: Internal Medicine

## 2020-08-30 LAB — BMP8+ANION GAP
Anion Gap: 16 mmol/L (ref 10.0–18.0)
BUN/Creatinine Ratio: 17 (ref 9–23)
BUN: 14 mg/dL (ref 6–24)
CO2: 23 mmol/L (ref 20–29)
Calcium: 9.4 mg/dL (ref 8.7–10.2)
Chloride: 98 mmol/L (ref 96–106)
Creatinine, Ser: 0.83 mg/dL (ref 0.57–1.00)
GFR calc Af Amer: 91 mL/min/{1.73_m2} (ref >59)
GFR calc non Af Amer: 79 mL/min/{1.73_m2} (ref >59)
Glucose: 106 mg/dL — ABNORMAL HIGH (ref 65–99)
Potassium: 4.6 mmol/L (ref 3.5–5.2)
Sodium: 137 mmol/L (ref 134–144)

## 2020-08-30 MED ORDER — MORPHINE SULFATE 30 MG PO TABS: 30.0000 mg | ORAL_TABLET | ORAL | 0 refills | Status: DC | PRN

## 2020-08-30 NOTE — Assessment & Plan Note (Signed)
Patient has chronic pain from cervical spine degeneration, right rotator cuff syndrome, and post-operative abdominal pain. She is on morphine 30 mg q 4 hrs prn which makes pain tolerable. She denies any adverse effects such as constipation, nausea, light headedness or falls.  PCP working on consideration of transitioning to [employer].   Continue current therapy for now. 3 separate scripts sent in for 3 month supply.

## 2020-08-30 NOTE — Assessment & Plan Note (Signed)
Home BP log reviewed. Values are within normal range since transitioning to amlodipine at previous visit. She is noted to be hypertensive here. Would compare home and office cuff readings at follow-up visit.  BMP re-checked today as per PCP request. Mild hyperkalemia has resolved since discontinuing ACE inhibitor.

## 2020-08-31 NOTE — Progress Notes (Signed)
Internal Medicine Clinic Attending  Case discussed with Dr. Bloomfield  At the time of the visit.  We reviewed the resident's history and exam and pertinent patient test results.  I agree with the assessment, diagnosis, and plan of care documented in the resident's note.  

## 2020-09-12 ENCOUNTER — Telehealth: Payer: Self-pay

## 2020-09-12 ENCOUNTER — Telehealth: Payer: Self-pay | Admitting: Internal Medicine

## 2020-09-12 NOTE — Telephone Encounter (Signed)
Pt is calling for her morphine (MSIR) 30 MG tablet pt stated that she called the pharmacy stated that she need a piror auth for the medication the# [phone]  Fax # [phone]. The pharmacy stated they can take the info over the phone ( pt stated that she had to pay for her medication out of packet )

## 2020-09-12 NOTE — Telephone Encounter (Signed)
  Reason for call:   I placed an outgoing call to Ms. [name] at 10:09 AM regarding her message on the "on-call pager" about her morphine prescriptions. The call was successful. We discussed her prescription for morphine and how she has been unable to fill it due to the need for a prior autheroization..   Patient states that the number to call for [employer] is [phone]. Their fax number is [phone].    Assessment/ Plan:   I ensured patient that we would look into the prior authorization of this medication.  As always, pt was advised that if symptoms worsen or new symptoms arise, they should go to an urgent care facility or to to ER for further evaluation.    Lawerance Cruel, D.O.  Internal Medicine Resident, PGY-2 [facility]  Pager: [phone] 10:09 AM, 09/12/2020

## 2020-09-12 NOTE — Telephone Encounter (Signed)
Per Regino Schultze, [place] completed today, awaiting determination. Hubbard Hartshorn, BSN, RN-BC

## 2020-09-12 NOTE — Telephone Encounter (Signed)
PA completed.  Awaiting determination.

## 2020-09-19 ENCOUNTER — Telehealth: Payer: Self-pay

## 2020-09-19 NOTE — Telephone Encounter (Signed)
Return pt's call - stated Morphine rx written "do not fill prior to 2/4"; stated the dilemma is [facility] will not fill and mail until the 4th; it takes 7 days before it arrives at her home. She wants to now if the rxs can be change/written so she can receive them on the 4th?

## 2020-09-19 NOTE — Telephone Encounter (Signed)
Pls contact pt regarding medicine, she is having a problem getting [phone]

## 2020-09-20 NOTE — Telephone Encounter (Signed)
Patient calling back about her morphine.  RN informed patient message was sent to MD yesterday and MD has 48hrs to respond per [facility] policy.  Patient wants to reiterate that "the problem is with [facility], they will not fill and ship until 2/4.  If MD does not want to allow RX to be filled 7 days early each time, then she will need [facility] [address] [facility]". Will forward to yellow team to advise. SChaplin, RN,BSN

## 2020-09-21 MED ORDER — MORPHINE SULFATE 30 MG PO TABS
30.0000 mg | ORAL_TABLET | ORAL | 0 refills | Status: DC | PRN
Start: 2020-09-21 — End: 2020-10-20

## 2020-09-21 NOTE — Telephone Encounter (Signed)
Rx for Morphine sent to [facility] to be filled on 1/28 so patient will have the order by 2/4.

## 2020-09-21 NOTE — Telephone Encounter (Signed)
Pt called and notified that PCP sent RX for Morphine to [facility] to be filled on 1/28 so patent will have the order by 2/4.  Patient now states she should have morphine RX's on hold at [facility] for next month too and if PCP can make sure that RX also has note to pharmacy that it needs to be filled 7 days early as well. Sending to PCP [place], RN,BSN

## 2020-09-28 ENCOUNTER — Other Ambulatory Visit: Payer: Self-pay | Admitting: Internal Medicine

## 2020-10-20 MED ORDER — MORPHINE SULFATE 30 MG PO TABS
30.0000 mg | ORAL_TABLET | ORAL | 0 refills | Status: DC | PRN
Start: 2020-10-20 — End: 2020-10-25

## 2020-10-20 NOTE — Telephone Encounter (Signed)
Morphine Rx for month of March sent to [facility] so that patient can receive it by 3/4.

## 2020-10-20 NOTE — Addendum Note (Signed)
Addended by: Harvie Heck on: 10/20/2020 10:26 AM   Modules accepted: Orders

## 2020-10-24 ENCOUNTER — Ambulatory Visit (INDEPENDENT_AMBULATORY_CARE_PROVIDER_SITE_OTHER): Payer: Medicare HMO | Admitting: Student

## 2020-10-24 ENCOUNTER — Encounter: Payer: Self-pay | Admitting: Student

## 2020-10-24 ENCOUNTER — Other Ambulatory Visit: Payer: Self-pay

## 2020-10-24 VITALS — BP 116/83 | HR 74 | Temp 98.5°F | Ht 65.0 in | Wt 146.6 lb

## 2020-10-24 DIAGNOSIS — G894 Chronic pain syndrome: Secondary | ICD-10-CM

## 2020-10-24 DIAGNOSIS — R252 Cramp and spasm: Secondary | ICD-10-CM

## 2020-10-24 DIAGNOSIS — M65332 Trigger finger, left middle finger: Secondary | ICD-10-CM | POA: Diagnosis not present

## 2020-10-24 NOTE — Assessment & Plan Note (Signed)
Patient complaining of locking motion of her left middle finger that started about 1 month ago.  She noticed a delayed finger extension after holding a fist or an object.  She complains of pain when the locking is released.  She denies any swelling of her joints.  Patient is taking morphine for chronic pain and occasional Aleve or Tylenol.  Patient reports working with her hands a lot in the past with repetitive motion.  Assessment and plan This is consistent with stenosing tenosynovitis.  Advised patient to use a finger splint and take NSAIDs such as Tylenol or ibuprofen to help with inflammation.  Can also use ice as needed.  If not better in 4-6 weeks, the next step is steroid injection. -Finger splint -NSAIDs and ice as needed

## 2020-10-24 NOTE — Assessment & Plan Note (Addendum)
Patient reports bilateral LE swelling leg cramps with associated spasm of her toes that started about 1 month ago.  A foot exam is benign.  There is minimally trace edema bilateral lower extremity.  Normal range of motion of ankle and toes.  Assessment and plan Her bilateral LE edema is likely a side effect of amlodipine.  We will continue amlodipine since the symptom does not really bother her.  If LE edema worsened, can consider switching to a different hypertension medication. Will check TSH and electrolytes for her spasm -Check TSH -Check BMP -Continue amlodipine   Addendum TSH, potassium and calcium are WNL.

## 2020-10-24 NOTE — Assessment & Plan Note (Addendum)
Patient taking morphine for chronic back and neck pain.  [facility] and was [facility] to [place] because it does not require prior authorization.  Patient last filled her morphine on 10/16/2020 for 120 tablets which should last 30 days.  There was a typo in previous prescription that her morphine should be Q6h instead of Q4h.   Patient requests that her April morphine prescription to be written on 11/19/2020 so she can receive it on 11/27/2020.  We will reach out to [place] to confirm how many days it takes to send out prescription.  Ideally, we would like to write a new prescription on the 28th day.    Patient will need to return to the clinic to meet with her PCP, Dr. Marva Panda in April to edit her contract because of the change in pharmacy. Will contact patient to notify her with all the updates.

## 2020-10-24 NOTE — Patient Instructions (Signed)
Julie Williams,  It is a pleasure seeing you in the clinic today.  Here is a summary of what we talked about:  1.  Trigger finger: Please obtain a finger splint at a local drugstore.  Please take Aleve or ibuprofen for the pain.  You can also apply ice as needed.  Please let me know if the pain is not better after 4-6 weeks  2.  Leg swelling: This is likely a side effect of your blood pressure medication.  Please let me know if the leg swelling is really bothering you, we can try a different blood pressure medication.  3.  Leg cramps and spasm: I will check your thyroid and electrolytes.  4.  I will call you and let you know when I send in the prescription for morphine.  Take care  Dr. Alfonse Spruce

## 2020-10-24 NOTE — Progress Notes (Signed)
Internal Medicine Clinic Attending  Case discussed with Dr. Alfonse Spruce  At the time of the visit.  We reviewed the resident's history and exam and pertinent patient test results.  I agree with the assessment, diagnosis, and plan of care documented in the resident's note.  Patient is on a controlled substance contract with [place] and receives morphine 30 mg (#120 a month). Looks like this was previously prescribed q6h PRN. However in October of 2021 PCP note documents q4h PRN which I feel was likely a mistake. This dosage has been copy forwarded since. She has received #120 tablets a month as a 30 day supply dating back 2 years on the PDMP. Two months ago in January she received #180 as a 30 day supply, then last month she received #120 as a 20 day supply. Will change prescription back to q6h PRN with #120 tablets per month.   In January, looks like patient changed insurance coverage and [facility] her [facility]. This was denied because she now had [employer] which covers her medications. PA was then sent to [facility] and approved. She is now receiving her morphine prescription through their mail in pharmacy.   She received her January prescription on 1/4. Later that month, patient requested her February prescription be filled 7 days early because there is a delay with the mail in pharmacy and it takes her 7 days to receive it. Her February prescription was filled on early on 1/28 to allow for the delay. She called again requesting the same for her March prescription, which again was filled early on 2/21. Today she is again requesting an early refill to allow for the "delay" with shipping. Regardless of when she receives the prescription, it should last her until she receives the next by mail. Starting prescription 7 days after fill date should last her until next prescription is mailed and received 7 days after the following fill date. I could see how this might be an issue when  she first changed pharmacies, but she should no longer require early refills going forward. Additionally, she should have an extra 60 tablets from her February prescription when she received 180 tablets (a prescribing error on our part). She claims to not know anything about this and denies having the extra tablets. Allowing refill 1-2 days early may be reasonable to account for flexibility with shipping, but she should not require a refill every 23 days.   Patient has a history of inappropriate behavior regarding her chronic opioids. See my note from 01/27/20 and Dr. Jodene Nam from 02/07/2020 (in addition to multiple telephone notes over the years). She needs 1 month follow up with PCP to address early refill requests and for repeat U[place]. Also to sign a new controlled substance contract with updated pharmacy. Terms of contract need to be reiterated. Next fill date should be 3/23 based on her last prescription.
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
## 2020-10-25 ENCOUNTER — Telehealth: Payer: Self-pay | Admitting: *Deleted

## 2020-10-25 ENCOUNTER — Telehealth: Payer: Self-pay | Admitting: Internal Medicine

## 2020-10-25 LAB — BMP8+ANION GAP
Anion Gap: 16 mmol/L (ref 10.0–18.0)
BUN/Creatinine Ratio: 18 (ref 9–23)
BUN: 13 mg/dL (ref 6–24)
CO2: 23 mmol/L (ref 20–29)
Calcium: 9.5 mg/dL (ref 8.7–10.2)
Chloride: 100 mmol/L (ref 96–106)
Creatinine, Ser: 0.74 mg/dL (ref 0.57–1.00)
Glucose: 107 mg/dL — ABNORMAL HIGH (ref 65–99)
Potassium: 4.9 mmol/L (ref 3.5–5.2)
Sodium: 139 mmol/L (ref 134–144)
eGFR: 95 mL/min/{1.73_m2} (ref >59)

## 2020-10-25 LAB — TSH: TSH: 1.31 u[IU]/mL (ref 0.450–4.500)

## 2020-10-25 MED ORDER — MORPHINE SULFATE 30 MG PO TABS: 30.0000 mg | ORAL_TABLET | Freq: Four times a day (QID) | ORAL | 0 refills | Status: DC | PRN

## 2020-10-25 NOTE — Telephone Encounter (Signed)
Thank you Lauren. So it sounds like it should only take 2 days for her to receive her meds ([facility] 2nd day air delivery) and not 7. Regardless she should not need early refills going forward. Thank you for correcting the prescription.

## 2020-10-25 NOTE — Telephone Encounter (Signed)
I called and spoke to patient. I explained to her that we would like to see her back in April so we can look at the contract together and make sure everything on it is correct. Patient states that she lives 1 hour away and she will not come back in a month for a contract that she signed in January. She reports that she did receive 180 tablets but did not notice. She states that she should not be punished for this mistake and made to come back to the clinic so often. She states that she will find another PCP if she has to come back to the clinic frequently. Unfortunately, we did not reach an agreement through this phone call. I told her that her PCP, Dr. Marva Panda, can give her a call.

## 2020-10-25 NOTE — Telephone Encounter (Signed)
Sounds good. I will call and let her know. Thank you

## 2020-10-25 NOTE — Telephone Encounter (Signed)
I called and informed patient of her lab results.

## 2020-10-25 NOTE — Telephone Encounter (Signed)
I think you can hold off since she already has a prescription to be filled this month. Should last her until she can see Dr. Marva Panda in April.

## 2020-10-25 NOTE — Telephone Encounter (Addendum)
Spoke with Antonieta Loveless Tech at [facility]. States all controlled meds are shipped second day air via [place]. Spoke with Santiago Glad, Software engineer at [facility]. States patient's morphine was shipped on following dates:  09/22/2020 for 180 tabs (first time [employer] filled morphine for her) 10/16/2020 for 120 tabs  [employer] received Rx dated 08/30/2020 for 120 tabs to be filled 10/27/2020. Santiago Glad is cancelling this Rx because she received Rx dated 10/20/2020 for 180 tabs to be filled 11/01/2020. She will change this qty to 120 and sig to q 6h prn   [facility] does not fill requests for controlled meds early.  [facility] States patient p/u Rx for 120 tabs q 4h on 07/29/2020 and 08/29/2020.

## 2020-10-25 NOTE — Telephone Encounter (Signed)
I will give her 1 month supply of Morphine 120 tablets for April. Will advise to see Dr. Marva Panda in April to renew her contract.

## 2020-10-25 NOTE — Telephone Encounter (Signed)
Also, looks like she has already canceled her PCP appointment for April. Can we have her reschedule this? If she wants to continue receiving her morphine through [facility] she needs to see her PCP next month. She has already been given a second chance last year.

## 2020-10-25 NOTE — Addendum Note (Signed)
Addended by: Jodean Lima on: 10/25/2020 12:26 PM   Modules accepted: Orders

## 2020-10-25 NOTE — Telephone Encounter (Signed)
Pt requesting a call back about her test results. 

## 2020-10-26 NOTE — Telephone Encounter (Signed)
To continue with a prescription, it is the expectation of the clinic that the patient come in once every 3 months.  She should be able to come in next month.  I would work on a letter for her with these expectations and have Dr. Marva Panda go over them with her.

## 2020-10-26 NOTE — Telephone Encounter (Signed)
Thank you Dr. Alfonse Spruce. I am going to get Dr. Daryll Drown and Dr. Jimmye Norman involved since this is a complex case. We've had this issue with her in the past not wanting to come to appointment and this was clearly documented as an expectation with Dr. Heber Denton last June.

## 2020-10-27 NOTE — Telephone Encounter (Signed)
Thank you :)

## 2020-11-13 ENCOUNTER — Telehealth: Payer: Self-pay

## 2020-11-13 NOTE — Telephone Encounter (Signed)
Last rx written  10/25/20. Last OV  10/24/20. Next OV  Has not been scheduled. UDS 7/721.

## 2020-11-13 NOTE — Telephone Encounter (Signed)
Return pt's call - she's aware it's not time for refill. Pt wants to know who will she be seeing; nform she will be seeing her doctor, Dr Marva Panda.

## 2020-11-13 NOTE — Telephone Encounter (Signed)
  morphine (MSIR) 30 MG tablet, refill request @  [id number] [place] [place] [facility] [address] Phone:  [phone]  Fax:  [phone]

## 2020-11-13 NOTE — Telephone Encounter (Signed)
Per Dr. Daryll Drown and Dr. Rivka Safer note, patient should come back to the clinic before we can fill this prescription. I will also talk to Dr. Philipp Ovens this afternoon. Thank you

## 2020-11-13 NOTE — Telephone Encounter (Signed)
Called pt to schedule an appt - no answer; left message to call the office. 

## 2020-11-13 NOTE — Telephone Encounter (Signed)
Pt called back to sch an appt. Her appt with Dr. Marva Panda 11/27/2020 @ 1:15. Pt would like a call back about pain med.

## 2020-11-13 NOTE — Telephone Encounter (Signed)
Yes, please have her schedule an appointment with Dr. Marva Panda in April. Looks like she has availability on the 4th. She just had a prescription filled on 3/9 so that should last her until her next appointment. Next fill date would be 4/8.

## 2020-11-14 ENCOUNTER — Encounter: Payer: Self-pay | Admitting: Internal Medicine

## 2020-11-14 NOTE — Telephone Encounter (Signed)
Thank you Dr. Daryll Drown! She was just seen by another resident last month but the problem was multiple early refill requests which will need to be addressed. She also needs to fill out a new contract with you Dr. Marva Panda to reflect her new pharmacy change. That is not her fault, it was required by her insurance but still needs updating. I would recommend collecting a urine sample at that visit as well. Thanks!

## 2020-11-14 NOTE — Telephone Encounter (Signed)
Hello Team - -  I have written Ms. Mcphee a letter with expectations.   Dr. Marva Panda - on 4/4, if she comes to clinic, please go over the letter with her in person.   I will also be mailing it today.   Thank you all!

## 2020-11-14 NOTE — Telephone Encounter (Signed)
I am sorry!  I missed that expectation in chart review.   Would highlight these areas in the new contract Dr. Loni Williams.  Make sure she is aware.   Hopefully she comes in on 4/4!

## 2020-11-27 ENCOUNTER — Ambulatory Visit (INDEPENDENT_AMBULATORY_CARE_PROVIDER_SITE_OTHER): Payer: Medicare HMO | Admitting: Internal Medicine

## 2020-11-27 ENCOUNTER — Encounter: Payer: Self-pay | Admitting: Internal Medicine

## 2020-11-27 ENCOUNTER — Encounter: Payer: Medicare HMO | Admitting: Internal Medicine

## 2020-11-27 ENCOUNTER — Other Ambulatory Visit: Payer: Self-pay

## 2020-11-27 VITALS — BP 136/98 | HR 72 | Temp 98.2°F | Ht 65.0 in | Wt 145.6 lb

## 2020-11-27 DIAGNOSIS — G894 Chronic pain syndrome: Secondary | ICD-10-CM | POA: Diagnosis not present

## 2020-11-27 DIAGNOSIS — N952 Postmenopausal atrophic vaginitis: Secondary | ICD-10-CM | POA: Diagnosis not present

## 2020-11-27 DIAGNOSIS — Z79891 Long term (current) use of opiate analgesic: Secondary | ICD-10-CM | POA: Diagnosis not present

## 2020-11-27 DIAGNOSIS — E039 Hypothyroidism, unspecified: Secondary | ICD-10-CM | POA: Diagnosis not present

## 2020-11-27 DIAGNOSIS — I10 Essential (primary) hypertension: Secondary | ICD-10-CM | POA: Diagnosis not present

## 2020-11-27 MED ORDER — MORPHINE SULFATE 30 MG PO TABS: 30.0000 mg | ORAL_TABLET | Freq: Four times a day (QID) | ORAL | 0 refills | Status: DC | PRN

## 2020-11-27 NOTE — Assessment & Plan Note (Signed)
Patient deferred pap smear at this time

## 2020-11-27 NOTE — Assessment & Plan Note (Addendum)
BP Readings from Last 3 Encounters:  11/27/20 (!) 136/98  10/24/20 116/83  08/29/20 (!) 148/107   BP slightly elevated at this visit. Patient notes that she is normotensive at home. Currently on amlodipine 10mg  daily. Denies any difficulty in obtaining or taking this medication.  Patient would like to come off of her antihypertensive. I discussed the recommendation to continue hypertension treatment to prevent risk of MI or stroke. She is agreeable to this.   Plan: Continue amlodipine 10mg  daily F/u BP in 3 months

## 2020-11-27 NOTE — Patient Instructions (Addendum)
Ms Julie Williams,  It was a pleasure seeing you in clinic. Today we discussed:   Pain control:  We signed a pain contract today for morphine 30mg  every 6 hours as needed for pain. I recommend also taking scheduled tylenol to help decrease the need for opiate pain medication. Thank you for providing a urine sample today. As discussed, please let us know if you would like a referral to a pain management clinic.  Continue taking all other medications as prescribed.   If you have any questions or concerns, please call our clinic at [phone] between 9am-5pm and after hours call [phone] and ask for the internal medicine resident on call. If you feel you are having a medical emergency please call 911.   Thank you, we look forward to helping you remain healthy!

## 2020-11-27 NOTE — Assessment & Plan Note (Signed)
Patient is on levothyroxine 36mcg daily for her hypothyroidism. Previously her TSH has been wnl; although she is also taking Biotin supplements which can alter the results of her TSH. Discussed recommendation to hold her Biotin for several weeks prior to next visit at which we will follow up with repeat TSH.   Plan: Continue levothyroxine 91mcg daily  F/u TSH at next visit, advised patient to hold her Biotin several weeks prior to her next visit

## 2020-11-27 NOTE — Assessment & Plan Note (Addendum)
Ms Zadaya Cuadra is taking morphine for chronic back pain, neck pain and abdominal pain following multiple abdominal surgeries for an SBO with incisional hernia s/p repair with absorbable mesh in 2020. As a result, patient is on long term opiate therapy. Patient has been on morphine 30mg  q6h with 120 tablets monthly. She did receive one month supply of 180 tablets which would equate to every 4 hours; however, notes that this did not provide much relief. She notes that she has been taking scheduled tylenol in addition to the morphine 30mg  every 4-6 hours. She notes that this makes the pain "tolerable".  Patient is frustrated with frequent visits and is frustrated as to why she is unable to get 180 tablets per month. I discussed my concern that since this did not provide much benefit regarding her pain relief, more frequent dosing of her opiate medication can cause more harm such as decreased gut motility and possible respiratory arrest. Given that she did not have much pain relief at a more frequent dose, every 6 hour dosing is more appropriate. Patient expresses frustration with this and notes that "you are just trying to take away my pain medication".  Discussed that adding on a long-acting medication or switching her to suboxone would not be appropriate at this time. I offered that if she does not feel we are adequately managing her pain, I can refer her to a pain management clinic. She declined at this time.  Plan: Pain contract signed at this visit  Urine tox  Morphine 30mg  q6h prn to be sent to [facility] to be filled 4/9
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
## 2020-11-30 NOTE — Progress Notes (Signed)
Internal Medicine Clinic Attending ° °Case discussed with Dr. Aslam  At the time of the visit.  We reviewed the resident’s history and exam and pertinent patient test results.  I agree with the assessment, diagnosis, and plan of care documented in the resident’s note.  °

## 2020-12-04 DIAGNOSIS — H524 Presbyopia: Secondary | ICD-10-CM | POA: Diagnosis not present

## 2020-12-05 LAB — TOXASSURE SELECT,+ANTIDEPR,UR

## 2020-12-11 ENCOUNTER — Telehealth: Payer: Self-pay | Admitting: Internal Medicine

## 2020-12-11 ENCOUNTER — Telehealth: Payer: Self-pay

## 2020-12-11 DIAGNOSIS — G894 Chronic pain syndrome: Secondary | ICD-10-CM

## 2020-12-11 DIAGNOSIS — Z79891 Long term (current) use of opiate analgesic: Secondary | ICD-10-CM

## 2020-12-11 MED ORDER — MORPHINE SULFATE 30 MG PO TABS: 30.0000 mg | ORAL_TABLET | Freq: Four times a day (QID) | ORAL | 0 refills | Status: DC | PRN

## 2020-12-11 NOTE — Telephone Encounter (Signed)
Pls contact pt regarding pain medicine [phone]

## 2020-12-11 NOTE — Telephone Encounter (Signed)
Okay I will send to the script to [facility]

## 2020-12-11 NOTE — Telephone Encounter (Signed)
Please call the patient back as she has questions about why she having such a delay in getting her medications.   Pt reports she will only be receiving 1 prescription for her morphine (MSIR) 30 MG tablet [[id number]  [facility], [place] (Ph: [phone]   Pt wants to know if she is going to get her 3 prescriptions or if she now has to be seen every month and get her medication monthly.  Pt is requesting a call back as to why her medication was called in to [facility] instead of [facility] as this causes delays.

## 2020-12-11 NOTE — Telephone Encounter (Signed)
Return pt's call who stated she saw her doctor on 11/27/20 and Morphine rx has not been sent to [facility]. Stated it takes 7 -10 days to get her medication. Morphine was last written 4/9 and [facility]; pt stated she no longer use [facility] and had been mentioned before and [facility] does not have a rx on file.. I will take [facility] off her chart per pt's request.

## 2020-12-11 NOTE — Telephone Encounter (Signed)
Pt was informed of rx - she wants  to now why a 3 month was not sent (see 2nd telephone encounter of today).

## 2020-12-11 NOTE — Telephone Encounter (Signed)
Spoke with Julie Williams, informed her that her script was sent to [facility]. Reassured her that her refills will arrive in timely manner as long as it is appropriate. Julie Williams expressed understanding.

## 2020-12-25 ENCOUNTER — Telehealth: Payer: Self-pay

## 2020-12-26 ENCOUNTER — Other Ambulatory Visit: Payer: Self-pay

## 2020-12-26 DIAGNOSIS — Z79891 Long term (current) use of opiate analgesic: Secondary | ICD-10-CM

## 2020-12-26 NOTE — Telephone Encounter (Signed)
Return pt's call. Stated the doctor usually send 3 Morphine rx's at a time but last month only 1 rx was sent. She wants know what's going on.and why this was done. Stated she has been coming in every 3 months as instructed  And she's concern she will not receive her med on time b/c she uses mail-order and it takes 7-10 days to receive it. She was informed I will let her doctor know her concern. Thanks

## 2020-12-26 NOTE — Telephone Encounter (Signed)
Please call pt back about meds.  

## 2020-12-27 MED ORDER — MORPHINE SULFATE 30 MG PO TABS: 30.0000 mg | ORAL_TABLET | Freq: Four times a day (QID) | ORAL | 0 refills | Status: DC | PRN

## 2020-12-27 NOTE — Telephone Encounter (Signed)
Returned call to patient. Patient is stating that there is no active prescription with [facility] at this time for her morphine. I advised her that this was last filled 2 weeks prior by Dr. Truman Hayward and she is not due for this yet. She notes that [facility] takes 7-10 days to get the medications to her and she will need her prescription refilled early. I advised her that the last fill date was on 4/22 per records and the earliest she can get it filled again will be on 5/22. Reassured that her refills will arrive in a timely manner as long as it is appropriate. She expresses understanding.  Morphine 30mg  q6h prn #120 refilled for start date of 01/14/2021.

## 2021-01-18 ENCOUNTER — Other Ambulatory Visit: Payer: Self-pay | Admitting: *Deleted

## 2021-01-18 MED ORDER — VORTIOXETINE HBR 10 MG PO TABS: 10.0000 mg | ORAL_TABLET | Freq: Every day | ORAL | 1 refills | Status: DC

## 2021-01-24 ENCOUNTER — Other Ambulatory Visit: Payer: Self-pay | Admitting: Internal Medicine

## 2021-02-05 ENCOUNTER — Other Ambulatory Visit: Payer: Self-pay | Admitting: Internal Medicine

## 2021-02-05 DIAGNOSIS — Z79891 Long term (current) use of opiate analgesic: Secondary | ICD-10-CM

## 2021-02-05 MED ORDER — MORPHINE SULFATE 30 MG PO TABS: 30.0000 mg | ORAL_TABLET | Freq: Four times a day (QID) | ORAL | 0 refills | Status: DC | PRN

## 2021-02-05 NOTE — Telephone Encounter (Signed)
Refill Request  Pt requesting to start her refill request as her medications are mailed to her and are due on 02/10/2021.  Pt also request an appt with Dr. Delrae Rend her PCP, but no appts are ava as of yet.   morphine (MSIR) 30 MG tablet  [place], [place] (Ph: [phone])  Order Details

## 2021-02-05 NOTE — Telephone Encounter (Signed)
Last rx written 01/14/21. Last OV 11/27/20. Next OV - has not been yet. UDS 11/27/20.

## 2021-02-12 ENCOUNTER — Telehealth: Payer: Self-pay

## 2021-02-12 NOTE — Telephone Encounter (Signed)
Morphine 30mg  #120 already sent to patient's mail order pharmacy via [facility] on 02/05/21 w/ receipt confirmation noted.  Start date of 02/16/21 also noted on RX. SChaplin, RN,BSN

## 2021-02-12 NOTE — Telephone Encounter (Signed)
Requesting refill on pain med, per patient it will run out on 02/26/2021. Please call pt back.

## 2021-02-21 ENCOUNTER — Telehealth: Payer: Self-pay

## 2021-02-21 NOTE — Telephone Encounter (Signed)
Pt requesting refill on pain medication, pt states she do not need an appt to come in for pain meds. States the doctor should just give her medication without an appt. Pt was being very rude to the front staff about scheduling an appt.

## 2021-02-21 NOTE — Telephone Encounter (Signed)
Pt called back after talking to [facility], talked to [facility] requesting an appt. Appt was schedule with Dr Barnet [place] on 7/12.

## 2021-03-06 ENCOUNTER — Ambulatory Visit (INDEPENDENT_AMBULATORY_CARE_PROVIDER_SITE_OTHER): Payer: Medicare HMO | Admitting: Student

## 2021-03-06 ENCOUNTER — Encounter: Payer: Self-pay | Admitting: Student

## 2021-03-06 DIAGNOSIS — G894 Chronic pain syndrome: Secondary | ICD-10-CM

## 2021-03-06 DIAGNOSIS — M79672 Pain in left foot: Secondary | ICD-10-CM | POA: Insufficient documentation

## 2021-03-06 DIAGNOSIS — H938X1 Other specified disorders of right ear: Secondary | ICD-10-CM | POA: Diagnosis not present

## 2021-03-06 DIAGNOSIS — R2231 Localized swelling, mass and lump, right upper limb: Secondary | ICD-10-CM | POA: Diagnosis not present
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
## 2021-03-06 NOTE — Assessment & Plan Note (Signed)
Patient endorses pain in her left heel, the medial aspect.  She states that this has been ongoing for a while and that it is limiting her ability to ambulate as she has pain that fluctuates.  I suspect a bone spur in this area and offered patient x-ray however before this could be discussed further if she left due to her being frustrated with our discussion concerning her medication request  Patient does continue endorse pain I would recommend imaging to assess for bone spur.  Also on differential is ligament as well as muscular strain.

## 2021-03-06 NOTE — Patient Instructions (Signed)
Thank you, JulieJulie Williams for allowing [place] to provide your care today. Today we discussed   Chronic pain syndrome-please continue to follow-up with our clinic will be writing you for a 1 month prescriptions per your PCP.  If you would like pain medication refills further on advance, I recommended 3 options, discussing this with our clinic manager Julie Williams, a referral to a pain center, or changing primary care providers.  You denied all of these and preferred to stay with our clinic.  We will continue to write for your pain medications.  Wrist pain -I believe that this is possibly a ganglion cyst.  We will continue to monitor, if you notice that is growing in size please call the clinic and we will reevaluate.  At times these can be aspirated if they are particularly bothersome.  Left foot pain -suspect that you are having a bone spur and recommended x-ray imaging however he left before we could discuss this matter further.  If you would like to pursue x-ray imaging please call clinic and I will place the order.  Right ear mass suspect that this may be a sebaceous cyst, you left before we can discuss this matter further.  If you would like to discuss again please call our clinic in follow-up.  I have ordered the following labs for you:  Lab Orders  No laboratory test(s) ordered today     Referrals ordered today:   Referral Orders  No referral(s) requested today     I have ordered the following medication/changed the following medications:   Stop the following medications: There are no discontinued medications.   Start the following medications: No orders of the defined types were placed in this encounter.    Follow up: 3 months    Should you have any questions or concerns please call the internal medicine clinic at [phone].     Julie Williams, D.O. [place]

## 2021-03-06 NOTE — Assessment & Plan Note (Signed)
Patient endorses a bump behind her right ear.  Pea-sized firm mass that is nontender to palpate.  Difficult to visualize secondary to location.  Patient states that this has been bothering her for some time.  Differential includes sebaceous cyst, low suspicion for lipoma due to her firmness.  Low suspicion for malignancy.  If area continues to grow would encourage patient to be reevaluated.  Before we could discuss plan further she left secondary to be frustrated concerning may not addressing her medical concerns today but rather discussing her medication request.

## 2021-03-06 NOTE — Assessment & Plan Note (Addendum)
Patient states she noticed a palpable round mass on the, palmar lateral aspect of her right wrist.  She states that it has been there for a while.  States that at times it gets firm and then soft.  On my examination with the palpation able to appreciate pea-sized circular mass.  This is nontender to palpate, it is soft.  I do suspect a ganglion cyst.  Also on differential is possible neuroma however patient is not having any tingling sensation with palpation.  Patient left before further discussion was able to be elicited.  If she would like to follow-up with [place], would recommend conservative management continue to monitor.  If patient continues to endorse symptoms, can refer to aspiration and possible removal.  Also on differential includes lipoma, epidermoid cyst.  Low suspicion that malignancy is the etiology of this.

## 2021-03-06 NOTE — Assessment & Plan Note (Signed)
Assessment: Julie Williams has a longstanding history of taking morphine for chronic back pain neck pain and abdominal pain after failing multiple abdominal surgeries for an SBO with incisional hernia status postrepair with mesh in 2020.  Her current regimen is morphine 30 mg every 6 hours as needed.  Appointment was scheduled for patient to be seen for pain medication refill request.  Upon my evaluation today patient is quite frustrated with not receiving a 1 month supply of her pain medications with 3 refills.  She feels as though having to call monthly for refill requests is leading to a delay in her receiving her pain medications and lead her to be in severe pain on days she does not have it.  Discussed with her that the pharmacy reaches out to [place] a few days in advance and we refill medications within 48 hours.  She states that despite this she has had issues in the past as at times there can be delays in the pharmacy as well as with her insurance and approving this.  Discussed with her that I will talk with my program director as well as clinic director about this and determine if we will be able to fill her prescriptions with 3 refills.  However on further discussion it appears as though patient's PCP Dr. Marva Panda requested that patient only received 30-day supply with no refills and have refill request come monthly.  After discussing this with the patient I also gave her alternatives if she did not agree with Dr. Mirna Mires plan.  The alternatives include discussing her current predicament with our clinic director and request to change primary care providers, option to was patient be referred to a pain clinic, option 3 patient change primary care provider offices, option for was patient discuss this further with her PCP.  At this time patient was quite frustrated with me and did not want to continue discussing these options.  She states that she came in here for her wrist, ear, foot pain and that all I wanted to  talk about was her chronic pain and morphine use.  I reiterated to [name] that I was simply trying to assist her in this matter and with her concern about pain medication refills.  She continued to say that Dr. Marva Panda as well as myself continue to try and push her towards pain clinic, she repeatedly counted each time when I would bring up the words pain clinic.  As a result of this the patient stood up and walked out of the room I did not feel as though I was willing to help her with her other medical problems but only focused on her chronic pain.  I discussed that if she would please have a seat I would further discuss the plan for her other issues for today however patient decided to leave.  Prior to leaving she stated that she will stay in the clinic and continue to deal with the frustrations of 1 month refills despite me giving her alternatives.  She left without her AVS and without discussing the plan further.  I will discuss this matter further with our clinic director as well as Chiropodist.  Patient does not seem as though she would like to discuss alternatives.  Because of this I am uncertain as to how I will be able to assist her further.  Patient's primary care provider has set parameters and had many discussions with Julie Williams concerning this.  Plan: -Continue morphine 30 mg tablets every 6  hours as needed -We will have further discussion about patient's chronic pain management with clinic director, her PCP, as well as office administrator

## 2021-03-07 DIAGNOSIS — M79672 Pain in left foot: Secondary | ICD-10-CM | POA: Diagnosis not present

## 2021-03-07 DIAGNOSIS — M67431 Ganglion, right wrist: Secondary | ICD-10-CM | POA: Diagnosis not present

## 2021-03-07 DIAGNOSIS — R220 Localized swelling, mass and lump, head: Secondary | ICD-10-CM | POA: Diagnosis not present

## 2021-03-09 NOTE — Progress Notes (Signed)
Internal Medicine Clinic Attending  Case discussed with Dr. Johnney Ou  At the time of the visit.  We reviewed the resident's history and exam and pertinent patient test results.  I agree with the assessment, diagnosis, and plan of care documented in the resident's note.   Based on my discussion with Dr. Johnney Ou, I believe that Julie Williams was inappropriate in her behavior with Dr. Johnney Ou.  Will discuss with our clinic director.

## 2021-03-14 DIAGNOSIS — M67431 Ganglion, right wrist: Secondary | ICD-10-CM | POA: Diagnosis not present

## 2021-03-19 ENCOUNTER — Ambulatory Visit (INDEPENDENT_AMBULATORY_CARE_PROVIDER_SITE_OTHER): Payer: Medicare HMO | Admitting: Podiatry

## 2021-03-19 ENCOUNTER — Other Ambulatory Visit: Payer: Self-pay | Admitting: Internal Medicine

## 2021-03-19 ENCOUNTER — Other Ambulatory Visit: Payer: Self-pay

## 2021-03-19 DIAGNOSIS — M722 Plantar fascial fibromatosis: Secondary | ICD-10-CM

## 2021-03-19 DIAGNOSIS — M216X9 Other acquired deformities of unspecified foot: Secondary | ICD-10-CM | POA: Diagnosis not present

## 2021-03-19 DIAGNOSIS — M7732 Calcaneal spur, left foot: Secondary | ICD-10-CM | POA: Diagnosis not present

## 2021-03-19 DIAGNOSIS — Z79891 Long term (current) use of opiate analgesic: Secondary | ICD-10-CM

## 2021-03-19 MED ORDER — DEXAMETHASONE SODIUM PHOSPHATE 120 MG/30ML IJ SOLN
4.0000 mg | Freq: Once | INTRAMUSCULAR | Status: AC
  Administered 2021-03-19: 4 mg via INTRAMUSCULAR

## 2021-03-19 MED ORDER — TRIAMCINOLONE ACETONIDE 10 MG/ML IJ SUSP
5.0000 mg | Freq: Once | INTRAMUSCULAR | Status: AC
  Administered 2021-03-19: 5 mg

## 2021-03-19 MED ORDER — MELOXICAM 15 MG PO TABS: 15.0000 mg | ORAL_TABLET | Freq: Every day | ORAL | 0 refills | Status: DC

## 2021-03-19 NOTE — Addendum Note (Signed)
Addended by: Hardie Pulley on: 03/19/2021 10:41 AM   Modules accepted: Orders

## 2021-03-19 NOTE — Patient Instructions (Signed)

## 2021-03-19 NOTE — Progress Notes (Signed)
  Subjective:  Patient ID: Julie Williams, female    DOB: 1965/12/13,  MRN: [id number]  Chief Complaint  Patient presents with   Pain    Pain at Lt back heel x 2-3 wks - no injury - 7/10 constant painh -worse with pressure -w/ swelling -was seen at [facility], pt had xrays and was dz'd with 2 bone spurs, pt was rx'd meloxicam, NS and stretching     55 y.o. female presents with the above complaint. History confirmed with patient.   Objective:  Physical Exam: warm, good capillary refill, no trophic changes or ulcerative lesions, normal DP and PT pulses, and normal sensory exam. Left Foot: tenderness to palpation medial calcaneal tuber, no pain with calcaneal squeeze, decreased ankle joint ROM, and +Silverskiold test  Radiographs: X-ray of the left foot: requested, patient declined  Assessment:   1. Plantar fasciitis   2. Equinus deformity of foot   3. Calcaneal spur of left foot      Plan:  Patient was evaluated and treated and all questions answered.  Plantar Fasciitis -XR reviewed with patient -Educated patient on stretching and icing of the affected limb -Plantar fascial brace dispensed -Injection delivered to the plantar fascia of the left foot.  Procedure: Injection Tendon/Ligament Consent: Verbal consent obtained. Location: Left plantar fascia at the glabrous junction; medial approach. Skin Prep: Alcohol. Injectate: 1 cc 0.5% marcaine plain, 1 cc betamethasone acetate-betamethasone sodium phosphate Disposition: Patient tolerated procedure well. Injection site dressed with a band-aid.  Return in about 1 month (around 04/19/2021) for Plantar fasciitis.

## 2021-03-19 NOTE — Telephone Encounter (Signed)
Refill Request-Pt requesting a call back in reference to her LOV 03/06/2021 and having to be seen at another office for multiple issues.    morphine morphine (MSIR) 30 MG tablet  Take 1 tablet (30 mg total) by mouth every 6 (six) hours as needed for severe pain., Starting Fri 02/16/2021, Normal  Dispense: 120 tablet  Refills: 0 ordered  Pharmacy: [facility] ([facility] - [place], [place] (Ph: [phone])

## 2021-03-19 NOTE — Telephone Encounter (Signed)
MSIR Last rx written  02/16/21. Last OV 03/06/21. Next OV - has not been scheduled. UDS 11/27/20.

## 2021-03-22 DIAGNOSIS — M67431 Ganglion, right wrist: Secondary | ICD-10-CM | POA: Diagnosis not present

## 2021-03-23 DIAGNOSIS — M67431 Ganglion, right wrist: Secondary | ICD-10-CM | POA: Insufficient documentation

## 2021-03-23 MED ORDER — MORPHINE SULFATE 30 MG PO TABS: 30.0000 mg | ORAL_TABLET | Freq: Four times a day (QID) | ORAL | 0 refills | Status: DC | PRN

## 2021-03-27 DIAGNOSIS — K432 Incisional hernia without obstruction or gangrene: Secondary | ICD-10-CM | POA: Diagnosis not present

## 2021-04-10 DIAGNOSIS — H903 Sensorineural hearing loss, bilateral: Secondary | ICD-10-CM | POA: Diagnosis not present

## 2021-04-18 DIAGNOSIS — K6389 Other specified diseases of intestine: Secondary | ICD-10-CM | POA: Diagnosis not present

## 2021-04-18 DIAGNOSIS — K760 Fatty (change of) liver, not elsewhere classified: Secondary | ICD-10-CM | POA: Diagnosis not present

## 2021-04-18 DIAGNOSIS — M47817 Spondylosis without myelopathy or radiculopathy, lumbosacral region: Secondary | ICD-10-CM | POA: Diagnosis not present

## 2021-04-18 DIAGNOSIS — K432 Incisional hernia without obstruction or gangrene: Secondary | ICD-10-CM | POA: Diagnosis not present

## 2021-04-18 DIAGNOSIS — I700 Atherosclerosis of aorta: Secondary | ICD-10-CM | POA: Diagnosis not present

## 2021-04-23 ENCOUNTER — Other Ambulatory Visit: Payer: Self-pay

## 2021-04-23 DIAGNOSIS — Z79891 Long term (current) use of opiate analgesic: Secondary | ICD-10-CM

## 2021-04-23 DIAGNOSIS — H903 Sensorineural hearing loss, bilateral: Secondary | ICD-10-CM | POA: Diagnosis not present

## 2021-04-23 NOTE — Telephone Encounter (Signed)
morphine (MSIR) 30 MG tablet, REFILL REQUEST @ [facility] ([facility] - [place], [place].  Requesting to be filled by today, states med is due 04/22/2021.

## 2021-04-23 NOTE — Telephone Encounter (Signed)
Last rx written 03/23/21. Last OV 03/06/21. Next OV 05/08/21 with PCP. UDS 11/27/20.

## 2021-04-24 MED ORDER — MORPHINE SULFATE 30 MG PO TABS: 30.0000 mg | ORAL_TABLET | Freq: Four times a day (QID) | ORAL | 0 refills | Status: AC | PRN

## 2021-04-24 NOTE — Telephone Encounter (Signed)
Pt requesting Morphine 30 mg to be filled by today. States she does not understand why she has to call the clinic every time she needs refill. States the doctor should just write 3 months of supply. Pt become upset, unhappy and hung up the phone.

## 2021-04-26 ENCOUNTER — Ambulatory Visit: Payer: Medicare HMO | Admitting: Podiatry

## 2021-04-27 ENCOUNTER — Telehealth: Payer: Self-pay | Admitting: Gastroenterology

## 2021-04-27 NOTE — Telephone Encounter (Signed)
Referral received for patient to be seen for small bowel mass seen on CT scan.  Patient is requesting for Dr. Havery Moros to review without having an office visit.  Please advise.

## 2021-05-01 NOTE — Telephone Encounter (Signed)
I have not seen her since 2020. Records arrived on my desk, I looked at her CT scan report from 04/18/21, it is not in our system (not in [place] or [place]). They compare it to another CT scan she had in 03/31/2020, also not in our system that I can see. This was ordered by a Dr. Orrin Brigham of the [facility] surgical practice   Remarkable findings are that she has 3.6cm lesion noted in the proximal jejunum and 1.9cm nodule at the ileocolonic surgical anastomosis. It appears the jejunal lesion was seen in 03/2020 but is since enlarged over that time. I don't have records of that prior CT Scan available and not sure what they are referring to, or what that was thought to be at the time. They also report a larger "mass" in the right lower quadrant which was on the prior CT scan but has since been "debulked in the interim". It appears Dr. Lilia Pro had referred the patient back to Dr. Marcello Moores of [facility] colorectal surgery, not sure if she has seen her yet. I do think she warrants surgical follow up for this issue, it appears she has been following with them over the past few years. I am not sure if she had another surgery at another hospital in the interim since I have seen her or what their plan was for this small bowel mass lesion.  Not sure what this small bowel lesion represents, it is in the jejunum and may be quite difficult to reach endoscopically but would need to discuss with my partners who do balloon endoscopy if this is something they would consider pursuing. I have not seen her in a few years, she would need a clinic visit to discuss this further, we can try to accommodate this as soon as possible. She should follow up with surgery who has been managing this. I am not sure if they may take her to the OR to have this evaluated / removed directly or if they want [place] to get involved or not. What did her surgeon recommend to her or tell her about her CT result? Thanks

## 2021-05-01 NOTE — Telephone Encounter (Signed)
Okay thanks. There is no mention of this small bowel lesion on that exam.  Otherwise, if her surgeons wanted her to see [place] I am happy to do so but based on the surgeon's last note he had referred to Dr. Marcello Moores of [facility]. Can the patient clarify what her surgeon told her and if he wanted her to follow up with [place] or surgery? Thanks

## 2021-05-01 NOTE — Telephone Encounter (Signed)
Spoke with patient at length in regards to recommendations. Pt states that Dr. Lilia Pro referred her back to GI, advised that she would need an appt for updated evaluation. Pt states that we have the records advised that is from a surgical standpoint not GI. Advised that we can get her scheduled to discuss further. Pt states that she has not been back to Dr.Thomas because she complained after surgery several times and they told her she was fine.She states that she is tired of being "ping-ponged around". Advised patient that there are other surgeons that she can be referred to. Pt states that "the area is just going to keep growing and she is just going to die". Advised patient several times that we are trying to do what is best but we can't jump the gun and get her scheduled for procedures without an evaluation because she does have a complicated situation. Pt states that I did great explaining everything that the doctor couldn't call and discuss with her. Pt declined to schedule appt for evaluation. Advised patient to call back if she changes her mind.

## 2021-05-01 NOTE — Telephone Encounter (Signed)
Spoke with patient, she states that she would like Dr. Havery Moros to review her records before she schedules an appt. Pt states that she has a "huge cantaloupe growth on her stomach that has increased in size." Pt reports that she can go from having constipation to loose stools, which is probably from a medication. Pt is aware that once Dr. Havery Moros reviews records we will let her know his recommendations. Pt verbalized understanding and had no concerns at the end of the call.  Dr. Havery Moros, records have been placed in your IN box for review. Thanks

## 2021-05-02 NOTE — Telephone Encounter (Signed)
Per previous note, pt has declined visit at this time.

## 2021-05-03 ENCOUNTER — Other Ambulatory Visit: Payer: Self-pay | Admitting: *Deleted

## 2021-05-03 DIAGNOSIS — M47812 Spondylosis without myelopathy or radiculopathy, cervical region: Secondary | ICD-10-CM | POA: Diagnosis not present

## 2021-05-03 DIAGNOSIS — Z1159 Encounter for screening for other viral diseases: Secondary | ICD-10-CM | POA: Diagnosis not present

## 2021-05-03 DIAGNOSIS — E559 Vitamin D deficiency, unspecified: Secondary | ICD-10-CM | POA: Diagnosis not present

## 2021-05-03 DIAGNOSIS — Z0000 Encounter for general adult medical examination without abnormal findings: Secondary | ICD-10-CM | POA: Diagnosis not present

## 2021-05-03 DIAGNOSIS — L659 Nonscarring hair loss, unspecified: Secondary | ICD-10-CM

## 2021-05-03 DIAGNOSIS — E039 Hypothyroidism, unspecified: Secondary | ICD-10-CM | POA: Diagnosis not present

## 2021-05-03 DIAGNOSIS — Z6823 Body mass index (BMI) 23.0-23.9, adult: Secondary | ICD-10-CM | POA: Diagnosis not present

## 2021-05-03 DIAGNOSIS — R5383 Other fatigue: Secondary | ICD-10-CM | POA: Diagnosis not present

## 2021-05-03 DIAGNOSIS — I10 Essential (primary) hypertension: Secondary | ICD-10-CM | POA: Diagnosis not present

## 2021-05-03 NOTE — Telephone Encounter (Signed)
Next appt scheduled 9/13 with PCP.

## 2021-05-08 ENCOUNTER — Encounter: Payer: Medicare HMO | Admitting: Internal Medicine

## 2021-05-08 DIAGNOSIS — R69 Illness, unspecified: Secondary | ICD-10-CM | POA: Diagnosis not present

## 2021-05-08 DIAGNOSIS — Z0100 Encounter for examination of eyes and vision without abnormal findings: Secondary | ICD-10-CM | POA: Diagnosis not present

## 2021-05-08 DIAGNOSIS — G894 Chronic pain syndrome: Secondary | ICD-10-CM | POA: Diagnosis not present

## 2021-05-08 DIAGNOSIS — M129 Arthropathy, unspecified: Secondary | ICD-10-CM | POA: Diagnosis not present

## 2021-05-08 DIAGNOSIS — Z79899 Other long term (current) drug therapy: Secondary | ICD-10-CM | POA: Diagnosis not present

## 2021-05-08 DIAGNOSIS — M5030 Other cervical disc degeneration, unspecified cervical region: Secondary | ICD-10-CM | POA: Diagnosis not present

## 2021-05-08 MED ORDER — LEVOTHYROXINE SODIUM 25 MCG PO TABS: 25.0000 ug | ORAL_TABLET | Freq: Every day | ORAL | 2 refills | Status: AC

## 2021-05-08 NOTE — Telephone Encounter (Signed)
Left detailed vm for patient letting her know that she has been scheduled for a follow up appt with Dr. Havery Moros on Wednesday, 05/09/21 at 4 PM. Pt asked to arrive about 10 minutes early for check in. Advised patient to call us back if she has any questions.

## 2021-05-08 NOTE — Telephone Encounter (Signed)
Pls call pt. She agreed on making an appt with Dr. Havery Moros but she is looking for an appt asap. She stated to be experiencing vomiting, diarrhea and pain in her abd.

## 2021-05-09 ENCOUNTER — Ambulatory Visit: Payer: Medicare HMO | Admitting: Gastroenterology

## 2021-05-10 ENCOUNTER — Other Ambulatory Visit: Payer: Self-pay

## 2021-05-10 ENCOUNTER — Ambulatory Visit (INDEPENDENT_AMBULATORY_CARE_PROVIDER_SITE_OTHER): Payer: Medicare HMO | Admitting: Podiatry

## 2021-05-10 ENCOUNTER — Ambulatory Visit: Payer: Medicare HMO | Admitting: Podiatry

## 2021-05-10 DIAGNOSIS — M216X9 Other acquired deformities of unspecified foot: Secondary | ICD-10-CM | POA: Diagnosis not present

## 2021-05-10 DIAGNOSIS — M7732 Calcaneal spur, left foot: Secondary | ICD-10-CM | POA: Diagnosis not present

## 2021-05-10 DIAGNOSIS — M722 Plantar fascial fibromatosis: Secondary | ICD-10-CM

## 2021-05-10 DIAGNOSIS — R69 Illness, unspecified: Secondary | ICD-10-CM | POA: Diagnosis not present

## 2021-05-10 MED ORDER — METHYLPREDNISOLONE 4 MG PO TBPK: ORAL_TABLET | ORAL | 0 refills | Status: DC

## 2021-05-10 MED ORDER — MELOXICAM 15 MG PO TABS: 15.0000 mg | ORAL_TABLET | Freq: Every day | ORAL | 1 refills | Status: DC

## 2021-05-10 NOTE — Progress Notes (Signed)
  Subjective:  Patient ID: Julie Williams, female    DOB: [birth year],  MRN: [id number]  No chief complaint on file.   55 y.o. female presents with the above complaint. History confirmed with patient. States the foot is not feeling better. Has been using the night splint, the plantar fascial brace, and doing stretches. States that the mobic did help but she ran out; shot only lasted for 2 days.  Objective:  Physical Exam: warm, good capillary refill, no trophic changes or ulcerative lesions, normal DP and PT pulses, and normal sensory exam. Left Foot: tenderness to palpation medial calcaneal tuber, no pain with calcaneal squeeze, decreased ankle joint ROM, and +Silverskiold test  Assessment:   1. Plantar fasciitis   2. Equinus deformity of foot   3. Calcaneal spur of left foot    Plan:  Patient was evaluated and treated and all questions answered.  Plantar Fasciitis -Hold repeat injection today. -Discussed continued stretching and icing. -Rx medrol and meloxicam. Patient advised not to start meloxicam until the day after completion of steroid pack. -Should pain persist consider MRI/surgical intervention.  Return in about 3 weeks (around 05/31/2021) for Plantar fasciitis.

## 2021-05-12 ENCOUNTER — Encounter (HOSPITAL_COMMUNITY): Payer: Self-pay

## 2021-05-12 ENCOUNTER — Emergency Department (HOSPITAL_COMMUNITY)
Admission: EM | Admit: 2021-05-12 | Discharge: 2021-05-12 | Discharge status: Against Medical Advice | Payer: Medicare HMO | Attending: Emergency Medicine | Admitting: Emergency Medicine

## 2021-05-12 DIAGNOSIS — Z79899 Other long term (current) drug therapy: Secondary | ICD-10-CM | POA: Diagnosis not present

## 2021-05-12 DIAGNOSIS — T782XXA Anaphylactic shock, unspecified, initial encounter: Secondary | ICD-10-CM

## 2021-05-12 DIAGNOSIS — E039 Hypothyroidism, unspecified: Secondary | ICD-10-CM | POA: Diagnosis not present

## 2021-05-12 DIAGNOSIS — F17210 Nicotine dependence, cigarettes, uncomplicated: Secondary | ICD-10-CM | POA: Insufficient documentation

## 2021-05-12 DIAGNOSIS — I10 Essential (primary) hypertension: Secondary | ICD-10-CM | POA: Diagnosis not present

## 2021-05-12 DIAGNOSIS — R69 Illness, unspecified: Secondary | ICD-10-CM | POA: Diagnosis not present

## 2021-05-12 DIAGNOSIS — R000 Tachycardia, unspecified: Secondary | ICD-10-CM | POA: Insufficient documentation

## 2021-05-12 DIAGNOSIS — R0602 Shortness of breath: Secondary | ICD-10-CM | POA: Diagnosis present

## 2021-05-12 LAB — CBC WITH DIFFERENTIAL/PLATELET
Abs Immature Granulocytes: 0.02 10*3/uL (ref 0.00–0.07)
Basophils Absolute: 0 10*3/uL (ref 0.0–0.1)
Basophils Relative: 0 %
Eosinophils Absolute: 0 10*3/uL (ref 0.0–0.5)
Eosinophils Relative: 0 %
HCT: 45.2 % (ref 36.0–46.0)
Hemoglobin: 15.2 g/dL — ABNORMAL HIGH (ref 12.0–15.0)
Immature Granulocytes: 0 %
Lymphocytes Relative: 25 %
Lymphs Abs: 2.2 10*3/uL (ref 0.7–4.0)
MCH: 32.6 pg (ref 26.0–34.0)
MCHC: 33.6 g/dL (ref 30.0–36.0)
MCV: 97 fL (ref 80.0–100.0)
Monocytes Absolute: 0.5 10*3/uL (ref 0.1–1.0)
Monocytes Relative: 6 %
Neutro Abs: 6.1 10*3/uL (ref 1.7–7.7)
Neutrophils Relative %: 69 %
Platelets: 241 10*3/uL (ref 150–400)
RBC: 4.66 MIL/uL (ref 3.87–5.11)
RDW: 12.3 % (ref 11.5–15.5)
WBC: 8.9 10*3/uL (ref 4.0–10.5)
nRBC: 0 % (ref 0.0–0.2)

## 2021-05-12 LAB — I-STAT BETA HCG BLOOD, ED (MC, WL, AP ONLY): I-stat hCG, quantitative: <5 m[IU]/mL (ref <5)

## 2021-05-12 LAB — BASIC METABOLIC PANEL
Anion gap: 9 (ref 5–15)
BUN: 23 mg/dL — ABNORMAL HIGH (ref 6–20)
CO2: 25 mmol/L (ref 22–32)
Calcium: 10.2 mg/dL (ref 8.9–10.3)
Chloride: 106 mmol/L (ref 98–111)
Creatinine, Ser: 0.9 mg/dL (ref 0.44–1.00)
GFR, Estimated: >60 mL/min (ref >60)
Glucose, Bld: 134 mg/dL — ABNORMAL HIGH (ref 70–99)
Potassium: 3.5 mmol/L (ref 3.5–5.1)
Sodium: 140 mmol/L (ref 135–145)

## 2021-05-12 MED ORDER — KETOROLAC TROMETHAMINE 15 MG/ML IJ SOLN
15.0000 mg | Freq: Once | INTRAMUSCULAR | Status: AC
  Administered 2021-05-12: 15 mg via INTRAVENOUS
  Filled 2021-05-12: qty 1

## 2021-05-12 MED ORDER — PREDNISONE 50 MG PO TABS: 50.0000 mg | ORAL_TABLET | Freq: Every day | ORAL | 0 refills | Status: AC

## 2021-05-12 MED ORDER — EPINEPHRINE 0.3 MG/0.3ML IJ SOAJ
INTRAMUSCULAR | Status: AC
  Administered 2021-05-12: 0.3 mg
  Filled 2021-05-12: qty 0.3

## 2021-05-12 MED ORDER — EPINEPHRINE 0.3 MG/0.3ML IJ SOAJ: 0.3000 mg | INTRAMUSCULAR | 0 refills | Status: AC | PRN

## 2021-05-12 MED ORDER — METHYLPREDNISOLONE SODIUM SUCC 125 MG IJ SOLR
125.0000 mg | Freq: Once | INTRAMUSCULAR | Status: AC
  Administered 2021-05-12: 125 mg via INTRAVENOUS
  Filled 2021-05-12: qty 2

## 2021-05-12 MED ORDER — DIPHENHYDRAMINE HCL 50 MG/ML IJ SOLN
25.0000 mg | Freq: Once | INTRAMUSCULAR | Status: AC
  Administered 2021-05-12: 25 mg via INTRAVENOUS
  Filled 2021-05-12: qty 1

## 2021-05-12 NOTE — ED Notes (Signed)
PA-C back at the bedside to re-eval.

## 2021-05-12 NOTE — ED Notes (Signed)
Epi administered  

## 2021-05-12 NOTE — ED Provider Notes (Signed)
I provided a substantive portion of the care of this patient.  I personally performed the entirety of the medical decision making for this encounter.      55 year old female presents after being stung by yellow jacket just prior to arrival.  Has history of prior anaphylactic reaction to bee stings.  Did not use epinephrine pen.  Upon arrival here.  She is tremulous and feels that her throat is tight.  Given epinephrine here.  We will also give other adjunct medications and observe   Lacretia Leigh, MD 05/12/21 1046

## 2021-05-12 NOTE — Discharge Instructions (Addendum)
You are seen in the ER today for your anaphylactic reaction to bee sting.  You administered epinephrine in the emergency department.  Your vital signs improved and your physical exam did as well.  It was recommended that you remain in the emergency department for 4 hours after administration of epinephrine for observation to ensure no recurring symptoms.  You expressed wishes to be discharged at this time against my medical advice.  You have been prescribed steroid burst to take for the next 5 days to help prevent recurrent anaphylactic reaction.  Please continue to take antihistamine such as Zyrtec or Benadryl for the next week as well.  You may follow-up with your primary care doctor.  Please call 911 and return to the ER immediately if you begin to develop any worsening pain in your throat, swelling in your throat, difficulty breathing, nausea or vomiting does not stop, or any any severe symptom.

## 2021-05-12 NOTE — ED Notes (Signed)
Provider notified
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
## 2021-05-12 NOTE — ED Notes (Addendum)
This nurse was notified by registration that the pt was stating she would like to leave. This nurse entered the resus bay and the pt stated that she wanted to leave and was attempting to pull off all of her monitoring leads. This nurse explained to the pt that although her VS are improved, she chose to give her epi pen to someone else, and therefore required epi here in the ED which requires [place] to monitor her for at least four hours. Silverio Decamp, PA-C notified and aware. Pt then pulled off all of her monitoring leads and entered the [facility] next to hers. This nurse asked the pt to please sit down on her stretcher and that the PA-C was notified and on her way to the bedside.

## 2021-05-12 NOTE — ED Notes (Signed)
PA-C at the bedside.  ?

## 2021-05-12 NOTE — ED Triage Notes (Signed)
Pt states she was stung by an yellow jacket just pta. PT states her throat feels tight. She states she normally has to administer EPI, however, she gave her epi pen to her neighbor that also got stung. Airway is patent.

## 2021-05-21 ENCOUNTER — Telehealth: Payer: Self-pay | Admitting: Podiatry

## 2021-05-21 DIAGNOSIS — R21 Rash and other nonspecific skin eruption: Secondary | ICD-10-CM | POA: Diagnosis not present

## 2021-05-21 DIAGNOSIS — W01198A Fall on same level from slipping, tripping and stumbling with subsequent striking against other object, initial encounter: Secondary | ICD-10-CM | POA: Diagnosis not present

## 2021-05-21 DIAGNOSIS — S01522A Laceration with foreign body of oral cavity, initial encounter: Secondary | ICD-10-CM | POA: Diagnosis not present

## 2021-05-21 DIAGNOSIS — W57XXXA Bitten or stung by nonvenomous insect and other nonvenomous arthropods, initial encounter: Secondary | ICD-10-CM | POA: Diagnosis not present

## 2021-05-21 DIAGNOSIS — R69 Illness, unspecified: Secondary | ICD-10-CM | POA: Diagnosis not present

## 2021-05-21 NOTE — Telephone Encounter (Signed)
[facility] pt-vac pt was seen on 05-10-21  Pt was given injection, medrol & meloxicam and she states foot is worse and wanted to know if anything else could be called in-[facility].  Pt had f/u with Dr March Rummage on 05-31-21.

## 2021-05-21 NOTE — Telephone Encounter (Signed)
She should stick with the meloxicam, can also take 1,000mg  (2 extra strength) tylenol every 6 hours in addition to this. Ice and elevate the foot when not on it

## 2021-05-21 NOTE — Telephone Encounter (Signed)
Left msg with instructions re: medication/reb

## 2021-05-22 DIAGNOSIS — Z6822 Body mass index (BMI) 22.0-22.9, adult: Secondary | ICD-10-CM | POA: Diagnosis not present

## 2021-05-22 DIAGNOSIS — F17210 Nicotine dependence, cigarettes, uncomplicated: Secondary | ICD-10-CM | POA: Diagnosis not present

## 2021-05-22 DIAGNOSIS — M5030 Other cervical disc degeneration, unspecified cervical region: Secondary | ICD-10-CM | POA: Diagnosis not present

## 2021-05-22 DIAGNOSIS — G894 Chronic pain syndrome: Secondary | ICD-10-CM | POA: Diagnosis not present

## 2021-05-22 DIAGNOSIS — R69 Illness, unspecified: Secondary | ICD-10-CM | POA: Diagnosis not present

## 2021-05-22 DIAGNOSIS — F17200 Nicotine dependence, unspecified, uncomplicated: Secondary | ICD-10-CM | POA: Diagnosis not present

## 2021-05-22 DIAGNOSIS — Z79899 Other long term (current) drug therapy: Secondary | ICD-10-CM | POA: Diagnosis not present

## 2021-05-22 DIAGNOSIS — M129 Arthropathy, unspecified: Secondary | ICD-10-CM | POA: Diagnosis not present

## 2021-05-28 DIAGNOSIS — Z6822 Body mass index (BMI) 22.0-22.9, adult: Secondary | ICD-10-CM | POA: Diagnosis not present

## 2021-05-28 DIAGNOSIS — Z9181 History of falling: Secondary | ICD-10-CM | POA: Diagnosis not present

## 2021-05-28 DIAGNOSIS — F17210 Nicotine dependence, cigarettes, uncomplicated: Secondary | ICD-10-CM | POA: Diagnosis not present

## 2021-05-28 DIAGNOSIS — E782 Mixed hyperlipidemia: Secondary | ICD-10-CM | POA: Diagnosis not present

## 2021-05-30 DIAGNOSIS — K432 Incisional hernia without obstruction or gangrene: Secondary | ICD-10-CM | POA: Diagnosis not present

## 2021-05-30 DIAGNOSIS — K6389 Other specified diseases of intestine: Secondary | ICD-10-CM | POA: Diagnosis not present

## 2021-05-31 ENCOUNTER — Ambulatory Visit (INDEPENDENT_AMBULATORY_CARE_PROVIDER_SITE_OTHER): Payer: Medicare HMO | Admitting: Podiatry

## 2021-05-31 ENCOUNTER — Encounter: Payer: Self-pay | Admitting: Podiatry

## 2021-05-31 ENCOUNTER — Other Ambulatory Visit: Payer: Self-pay

## 2021-05-31 DIAGNOSIS — M722 Plantar fascial fibromatosis: Secondary | ICD-10-CM

## 2021-05-31 DIAGNOSIS — M216X9 Other acquired deformities of unspecified foot: Secondary | ICD-10-CM

## 2021-05-31 DIAGNOSIS — M7732 Calcaneal spur, left foot: Secondary | ICD-10-CM

## 2021-06-07 NOTE — Progress Notes (Signed)
  Subjective:  Patient ID: Julie Williams, female    DOB: May 10, 1966,  MRN: [id number]  Chief Complaint  Patient presents with   Plantar Fasciitis    I have tried everything and it is still hurting on both feet      55 y.o. female presents with the above complaint. History confirmed with patient.  Is frustrated that the pain is not getting better.  States that the medication did not help at all and does not want another shot Objective:  Physical Exam: warm, good capillary refill, no trophic changes or ulcerative lesions, normal DP and PT pulses, and normal sensory exam. Left Foot: tenderness to palpation medial calcaneal tuber, no pain with calcaneal squeeze, decreased ankle joint ROM, and +Silverskiold test  Assessment:   1. Plantar fasciitis   2. Equinus deformity of foot   3. Calcaneal spur of left foot     Plan:  Patient was evaluated and treated and all questions answered.  Plantar Fasciitis -Patient appears to be frustrated.  I discussed repeat injection and she declined.  I discussed physical therapy and she declined.  I discussed possible surgical intervention however I would like an MRI first to evaluate the ligament before this.  Patient declined both options.  I discussed that I am here to help her and there are numerous avenues we can consider but patient does not appear to be interested in any of these.  Patient initiated the end of the visit without conclusion.  Follow-up should she like to further discuss  No follow-ups on file.

## 2021-06-11 ENCOUNTER — Telehealth: Payer: Self-pay

## 2021-06-11 NOTE — Telephone Encounter (Signed)
-----   Message from Yetta Flock, MD sent at 06/10/2021  2:30 PM EDT ----- Regarding: RE: Referral Gabe thanks for sending this to me. I saw her a few years ago. More recently we actually coordinated an office visit for her to see me to review this back in September and she no-showed, said her insurance would not cover the visit with [place].   I think she needs to see me in the office to discuss all of this and see if she would be willing to consider an endoscopy study. We will reach out to her and see if she is interested in an office visit, but if there is an insurance issue with [place] we will have to sort that our first.   Herbert Seta can you contact this patient and clarify if she is able to see [place] in the office, and clarify if there is any issues with her insurance covering a visit to our office? If no insurance issues I'm happy to see her in the office to discuss this further. Thanks  ----- Message ----- From: Irving Copas., MD Sent: 06/08/2021   5:18 PM EDT To: Felicie Morn, MD, # Subject: Referral                                       PJS, Thanks for reaching out and sending this referral to me.  This is actually a patient of Dr. Havery Moros. I had an opportunity to review the CT scan and I did speak with radiology briefly.  They do think that this imaging study is suggestive of the lesion being in the proximal jejunum just about 6 to 10 cm from the ligament of Treitz; although there is one loop of bowel that is not distended completely and could be an adjacent loop with the lesion being much further down.  This may be an reach of standard push enteroscopy versus potential role of balloon assisted enteroscopy. With that being said reading through her history and her multiple surgical issues and the physical exam of her abdomen, I have some significant concern about the risks of push enteroscopy and/or balloon enteroscopy. Dr. Bryan Lemma does the majority of these balloon  enteroscopy's at this time and so I would like an opportunity for him to review the case and see if he thought he wanted to try and reach this or if he wanted Dr. Havery Moros or myself to just pursue a deep push enteroscopy.  Device assisted enteroscopy carries higher risk of complications in patients who are postsurgical with perforations being as high as 15 to 20% in some literature. With this being said we can try to come up with a plan and decide from there in regards to trying to evaluate this area. In either case one of the GI docs should have an opportunity to talk with her, if a push or balloon enteroscopy is considered. Reading your note it suggestive that surgical intervention is unlikely to be helpful for her based on what you have noted so going into her abdomen is not exciting. I will forward this to Dr. Bryan Lemma and Dr. Havery Moros and we will try and get back in touch together in the coming days. Thanks. GM

## 2021-06-11 NOTE — Telephone Encounter (Signed)
Spoke with patient, she has been scheduled for a follow up with Dr. Havery Moros on Friday, 06/15/21 at 9:20 am. Pt had no concerns at the end of the call.

## 2021-06-15 ENCOUNTER — Encounter: Payer: Self-pay | Admitting: Gastroenterology

## 2021-06-15 ENCOUNTER — Ambulatory Visit (INDEPENDENT_AMBULATORY_CARE_PROVIDER_SITE_OTHER): Payer: Medicare HMO | Admitting: Gastroenterology

## 2021-06-15 VITALS — BP 134/84 | HR 78 | Ht 65.0 in | Wt 140.0 lb

## 2021-06-15 DIAGNOSIS — K6389 Other specified diseases of intestine: Secondary | ICD-10-CM

## 2021-06-15 DIAGNOSIS — R109 Unspecified abdominal pain: Secondary | ICD-10-CM | POA: Diagnosis not present

## 2021-06-15 DIAGNOSIS — K5900 Constipation, unspecified: Secondary | ICD-10-CM | POA: Diagnosis not present

## 2021-06-15 DIAGNOSIS — R933 Abnormal findings on diagnostic imaging of other parts of digestive tract: Secondary | ICD-10-CM | POA: Diagnosis not present
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
## 2021-06-18 ENCOUNTER — Telehealth: Payer: Self-pay | Admitting: Gastroenterology

## 2021-06-18 NOTE — Telephone Encounter (Signed)
Called patient. I spoke with my colleagues Dr. Rush Landmark and Dr. Bryan Lemma about her case. Want to discuss next steps in her care. Called and no answer, I left a voice message. I will call her back again in the near future to try again to reach her.

## 2021-06-19 ENCOUNTER — Telehealth: Payer: Self-pay | Admitting: Surgery

## 2021-06-19 NOTE — Telephone Encounter (Signed)
Patient returned the call asked if you can call her back this afternoon.

## 2021-06-19 NOTE — Telephone Encounter (Signed)
I called the patient. We had a lengthy discussion about her options. I spoke with my colleagues about her case. Dr. Rush Landmark has offered her a standard enteroscopy to evaluate this lesion (he has reviewed with radiology and thinks it may be within reach of standard enteroscopy), but no longer performs balloon enteroscopy and would not offer her that if standard enteroscopy failed to reach it. Further, given the size of the lesion, this would more the likely be biopsied unless something that was clearly benign appearing and amenable to endoscopic resection, but that is less likely given CT findings. Alternative, I spoke with Dr. Bryan Lemma about balloon enteroscopy, risk of perforation given her anatomy upwards of 20% and he would not offer removal of a lesion of this size in the small bowel, that would have to be done at tertiary care center like [place] or [facility] After discussion of all of this, the lesion very likely may require surgical resection. Her preference is to avoid enteroscopy given it's risks and that it may not likely be able to remove the lesion. We discussed role of enteroscopy would be to biopsy / diagnose, tattoo the area to localize it, and small chance of endoscopic resection of amenable. She understands this but really is not willing to accept the risks of we don't confidently think we can remove it. She would much rather prefer to have surgery to definitively localize this and remove it and does not wish to have enteroscopy or balloon after lengthy discussion about this and she understands that she may be a difficult surgery given her prior history of surgeries and adhesive disease. She asked that I reach out to her surgeon to see if they would be willing to proceed with resection without a diagnosis. I will touch base with them and we will follow up with her afterwards. She appreciate the phone call and time spent discussing her care

## 2021-06-19 NOTE — Telephone Encounter (Signed)
Discussed case with Dr. Havery Moros today.  Called to talk to [facility] and went straight to voicemail.  Left message to have her call our office tomorrow to discuss.  This lesion on CT appears 5 cm or so past the ligament of treitz and would require a large surgery to resect.  Strange appearance on CT, appears well circumscribed, does not appear invasive.  Patient does not want to undergo endoscopy for diagnosis alone and just wants to have surgery.  I do not feel comfortable offering her a surgery without endoscopic evaluation of this area.  Will discuss with her over the phone when I can get in touch.

## 2021-06-20 ENCOUNTER — Other Ambulatory Visit: Payer: Self-pay

## 2021-06-20 DIAGNOSIS — K6389 Other specified diseases of intestine: Secondary | ICD-10-CM

## 2021-06-20 NOTE — Telephone Encounter (Signed)
Spoke with patient again after I spoke with her surgeon Dr. Thermon Leyland yesterday. They would really prefer to have a tissue diagnosis prior to proceeding with what could be a very significant surgery for this patient. I had had this discussion with the patient previously but hearing it from Dr. Thermon Leyland in regards to the implications for her operation, she is willing to undergo enteroscopy with Dr. Rush Landmark. She understands the risks of doing this and wishes to proceed. Julie Williams, this is the patient we discussed previously. She is agreeable to enteroscopy in hopes of reaching this lesion, getting a diagnosis, and seeing if amenable to endoscopic resection, understanding that the expectation is that more than likely is not going to be amenable to endoscopic resection and still may need surgery. I'm assuming you would want this case done at the hospital. If that is the case, Chong Sicilian can you please schedule once GM can comment on this? Thanks

## 2021-06-20 NOTE — Telephone Encounter (Signed)
Please proceed with scheduling as Enteroscopy 90 minute case with possible EMR. No clinic visit as this has already been extensively discussed with Dr. Havery Moros. Please let SA and I know when she is scheduled. Thanks. GM

## 2021-06-20 NOTE — Telephone Encounter (Signed)
The pt has been scheduled for enteroscopy EMR on 07/25/21 at [facility] with GM.

## 2021-06-20 NOTE — Telephone Encounter (Signed)
Gabe thanks very much for your help with this case 

## 2021-06-20 NOTE — Telephone Encounter (Signed)
Patty, Thanks for helping with this. GM

## 2021-06-20 NOTE — Telephone Encounter (Signed)
Patient returned call. States she spoken with the surgeon and was advised she needed the procedure done for biopsy first. I advised about the recommendation of having procedure done at tertiary care like [facility] or [facility] but patient didn't quite understand and states that wasn't discussed with her.   Thank you

## 2021-06-20 NOTE — Telephone Encounter (Signed)
The pt has been instructed for enteroscopy on 07/25/21 at 1030 am.  She has also been mailed a copy of the instructions.  All questions answered to the best of my ability.  She is aware to call if she has any concerns or future questions.

## 2021-07-04 ENCOUNTER — Other Ambulatory Visit: Payer: Self-pay | Admitting: Student

## 2021-07-25 ENCOUNTER — Ambulatory Visit (HOSPITAL_COMMUNITY)
Admission: RE | Admit: 2021-07-25 | Discharge: 2021-07-25 | Disposition: A | Discharge status: Home / Self Care | Payer: Medicare HMO | Attending: Gastroenterology | Admitting: Gastroenterology

## 2021-07-25 ENCOUNTER — Ambulatory Visit (HOSPITAL_COMMUNITY): Payer: Medicare HMO | Admitting: Certified Registered Nurse Anesthetist

## 2021-07-25 ENCOUNTER — Other Ambulatory Visit: Payer: Self-pay

## 2021-07-25 ENCOUNTER — Encounter (HOSPITAL_COMMUNITY)
Admission: RE | Disposition: A | Discharge status: Home / Self Care | Payer: Self-pay | Source: Home / Self Care | Attending: Gastroenterology

## 2021-07-25 ENCOUNTER — Encounter (HOSPITAL_COMMUNITY): Payer: Self-pay | Admitting: Gastroenterology

## 2021-07-25 DIAGNOSIS — I10 Essential (primary) hypertension: Secondary | ICD-10-CM | POA: Diagnosis not present

## 2021-07-25 DIAGNOSIS — K219 Gastro-esophageal reflux disease without esophagitis: Secondary | ICD-10-CM | POA: Insufficient documentation

## 2021-07-25 DIAGNOSIS — Z8719 Personal history of other diseases of the digestive system: Secondary | ICD-10-CM | POA: Diagnosis not present

## 2021-07-25 DIAGNOSIS — K6389 Other specified diseases of intestine: Secondary | ICD-10-CM | POA: Diagnosis not present

## 2021-07-25 DIAGNOSIS — E039 Hypothyroidism, unspecified: Secondary | ICD-10-CM | POA: Insufficient documentation

## 2021-07-25 DIAGNOSIS — K2950 Unspecified chronic gastritis without bleeding: Secondary | ICD-10-CM | POA: Insufficient documentation

## 2021-07-25 DIAGNOSIS — K449 Diaphragmatic hernia without obstruction or gangrene: Secondary | ICD-10-CM | POA: Insufficient documentation

## 2021-07-25 DIAGNOSIS — R109 Unspecified abdominal pain: Secondary | ICD-10-CM | POA: Diagnosis present

## 2021-07-25 DIAGNOSIS — D130 Benign neoplasm of esophagus: Secondary | ICD-10-CM | POA: Insufficient documentation

## 2021-07-25 DIAGNOSIS — M1990 Unspecified osteoarthritis, unspecified site: Secondary | ICD-10-CM | POA: Insufficient documentation

## 2021-07-25 DIAGNOSIS — F32A Depression, unspecified: Secondary | ICD-10-CM | POA: Insufficient documentation

## 2021-07-25 DIAGNOSIS — K2289 Other specified disease of esophagus: Secondary | ICD-10-CM | POA: Insufficient documentation

## 2021-07-25 DIAGNOSIS — F17210 Nicotine dependence, cigarettes, uncomplicated: Secondary | ICD-10-CM | POA: Insufficient documentation

## 2021-07-25 DIAGNOSIS — R933 Abnormal findings on diagnostic imaging of other parts of digestive tract: Secondary | ICD-10-CM | POA: Diagnosis present

## 2021-07-25 DIAGNOSIS — K3189 Other diseases of stomach and duodenum: Secondary | ICD-10-CM

## 2021-07-25 HISTORY — PX: ENTEROSCOPY: SHX5533

## 2021-07-25 HISTORY — PX: POLYPECTOMY: SHX5525

## 2021-07-25 HISTORY — PX: SUBMUCOSAL TATTOO INJECTION: SHX6856

## 2021-07-25 SURGERY — ENTEROSCOPY
Anesthesia: General

## 2021-07-25 MED ORDER — PROPOFOL 500 MG/50ML IV EMUL
INTRAVENOUS | Status: AC
  Filled 2021-07-25: qty 50

## 2021-07-25 MED ORDER — ALBUTEROL SULFATE (2.5 MG/3ML) 0.083% IN NEBU
INHALATION_SOLUTION | RESPIRATORY_TRACT | Status: AC
  Filled 2021-07-25: qty 3

## 2021-07-25 MED ORDER — LACTATED RINGERS IV SOLN
INTRAVENOUS | Status: DC
  Administered 2021-07-25: 1000 mL via INTRAVENOUS

## 2021-07-25 MED ORDER — FENTANYL CITRATE (PF) 100 MCG/2ML IJ SOLN
INTRAMUSCULAR | Status: DC | PRN
  Administered 2021-07-25 (×2): 50 ug via INTRAVENOUS
  Administered 2021-07-25: 100 ug via INTRAVENOUS

## 2021-07-25 MED ORDER — SODIUM CHLORIDE 0.9 % IV SOLN: INTRAVENOUS | Status: DC

## 2021-07-25 MED ORDER — PROPOFOL 10 MG/ML IV BOLUS
INTRAVENOUS | Status: AC
  Filled 2021-07-25: qty 20

## 2021-07-25 MED ORDER — DEXAMETHASONE SODIUM PHOSPHATE 4 MG/ML IJ SOLN
INTRAMUSCULAR | Status: DC | PRN
Start: 2021-07-25 — End: 2021-07-25
  Administered 2021-07-25: 4 mg via INTRAVENOUS

## 2021-07-25 MED ORDER — ONDANSETRON HCL 4 MG/2ML IJ SOLN
INTRAMUSCULAR | Status: DC | PRN
  Administered 2021-07-25: 4 mg via INTRAVENOUS

## 2021-07-25 MED ORDER — SUGAMMADEX SODIUM 200 MG/2ML IV SOLN
INTRAVENOUS | Status: DC | PRN
Start: 2021-07-25 — End: 2021-07-25
  Administered 2021-07-25: 200 mg via INTRAVENOUS

## 2021-07-25 MED ORDER — LIDOCAINE 2% (20 MG/ML) 5 ML SYRINGE
INTRAMUSCULAR | Status: DC | PRN
Start: 2021-07-25 — End: 2021-07-25
  Administered 2021-07-25: 80 mg via INTRAVENOUS

## 2021-07-25 MED ORDER — FENTANYL CITRATE (PF) 100 MCG/2ML IJ SOLN
INTRAMUSCULAR | Status: AC
  Filled 2021-07-25: qty 2

## 2021-07-25 MED ORDER — ALBUTEROL SULFATE (2.5 MG/3ML) 0.083% IN NEBU
2.5000 mg | INHALATION_SOLUTION | Freq: Once | RESPIRATORY_TRACT | Status: AC
  Administered 2021-07-25: 2.5 mg via RESPIRATORY_TRACT

## 2021-07-25 MED ORDER — ESMOLOL HCL 100 MG/10ML IV SOLN
INTRAVENOUS | Status: DC | PRN
  Administered 2021-07-25: 20 mg via INTRAVENOUS
  Administered 2021-07-25: 30 mg via INTRAVENOUS

## 2021-07-25 MED ORDER — MIDAZOLAM HCL 2 MG/2ML IJ SOLN
INTRAMUSCULAR | Status: AC
  Filled 2021-07-25: qty 2

## 2021-07-25 MED ORDER — MIDAZOLAM HCL 5 MG/5ML IJ SOLN
INTRAMUSCULAR | Status: DC | PRN
Start: 2021-07-25 — End: 2021-07-25
  Administered 2021-07-25: 2 mg via INTRAVENOUS

## 2021-07-25 MED ORDER — SPOT INK MARKER SYRINGE KIT
PACK | SUBMUCOSAL | Status: DC | PRN
  Administered 2021-07-25: 3 mL via SUBMUCOSAL

## 2021-07-25 MED ORDER — PROPOFOL 10 MG/ML IV BOLUS
INTRAVENOUS | Status: DC | PRN
  Administered 2021-07-25: 180 mg via INTRAVENOUS

## 2021-07-25 MED ORDER — ROCURONIUM BROMIDE 10 MG/ML (PF) SYRINGE
PREFILLED_SYRINGE | INTRAVENOUS | Status: DC | PRN
  Administered 2021-07-25: 70 mg via INTRAVENOUS
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
## 2021-07-25 NOTE — Transfer of Care (Signed)
Immediate Anesthesia Transfer of Care Note  Patient: Julie Williams  Procedure(s) Performed: ENTEROSCOPY POLYPECTOMY SUBMUCOSAL TATTOO INJECTION  Patient Location: Endoscopy Unit  Anesthesia Type:General  Level of Consciousness: awake, alert , oriented and patient cooperative  Airway & Oxygen Therapy: Patient Spontanous Breathing and Patient connected to nasal cannula oxygen  Post-op Assessment: Report given to RN and Post -op Vital signs reviewed and stable  Post vital signs: Reviewed and stable  Last Vitals:  Vitals Value Taken Time  BP    Temp    Pulse 84 07/25/21 1202  Resp 17 07/25/21 1202  SpO2 100 % 07/25/21 1202  Vitals shown include unvalidated device data.  Last Pain:  Vitals:   07/25/21 0916  PainSc: 0-No pain         Complications: No notable events documented.

## 2021-07-25 NOTE — Anesthesia Procedure Notes (Signed)
Procedure Name: Intubation Date/Time: 07/25/2021 11:00 AM Performed by: Claudia Desanctis, CRNA Pre-anesthesia Checklist: Patient identified, Emergency Drugs available, Suction available and Patient being monitored Patient Re-evaluated:Patient Re-evaluated prior to induction Oxygen Delivery Method: Circle system utilized Preoxygenation: Pre-oxygenation with 100% oxygen Induction Type: IV induction Ventilation: Mask ventilation without difficulty Laryngoscope Size: 2 and Miller Grade View: Grade I Tube type: Oral Tube size: 7.0 mm Number of attempts: 1 Airway Equipment and Method: Stylet Placement Confirmation: ETT inserted through vocal cords under direct vision, positive ETCO2 and breath sounds checked- equal and bilateral Secured at: 21 cm Tube secured with: Tape Dental Injury: Teeth and Oropharynx as per pre-operative assessment

## 2021-07-25 NOTE — Op Note (Signed)
[facility] Patient Name: Julie Williams Procedure Date: 07/25/2021 MRN: [id number] Attending MD: Justice Britain , MD Date of Birth: [birth year] CSN: [id number] Age: 55 Admit Type: Outpatient Procedure:                Small bowel enteroscopy Indications:              Unexplained generalized abdominal distress/pain,                            Abnormal abdominal CT, Exclusion of polyps in the                            small bowel Providers:                Justice Britain, MD, Burtis Junes, RN, Tyna Jaksch Technician Referring MD:             Carlota Raspberry. Havery Moros, MD, Felicie Morn,                            [facility] Medicines:                Monitored Anesthesia Care Complications:            No immediate complications. Estimated Blood Loss:     Estimated blood loss was minimal. Procedure:                Pre-Anesthesia Assessment:                           - Prior to the procedure, a History and Physical                            was performed, and patient medications and                            allergies were reviewed. The patient's tolerance of                            previous anesthesia was also reviewed. The risks                            and benefits of the procedure and the sedation                            options and risks were discussed with the patient.                            All questions were answered, and informed consent                            was obtained. Prior Anticoagulants: The patient has                            taken  no previous anticoagulant or antiplatelet                            agents. ASA Grade Assessment: III - A patient with                            severe systemic disease. After reviewing the risks                            and benefits, the patient was deemed in                            satisfactory condition to undergo the procedure.                            After obtaining informed consent, the endoscope was                            passed under direct vision. Throughout the                            procedure, the patient's blood pressure, pulse, and                            oxygen saturations were monitored continuously. The                            PCF-HQ190L ([id number]) Olympus colonoscope was                            introduced through the mouth and advanced to the                            proximal jejunum. The PCF-H190TL ([id number]) Olympus                            slim colonoscope was introduced through the mouth                            and advanced to the proximal jejunum. The small                            bowel enteroscopy was accomplished without                            difficulty. The patient tolerated the procedure. Scope In: Scope Out: Findings:      A single 6 mm polypoid lesion with no bleeding was found 27 cm from the       incisors. The lesion was removed with a cold snare. Resection and       retrieval were complete.      No other gross lesions were noted in the entire esophagus.      The Z-line was irregular and was found 40 cm from the incisors.      A 1  cm hiatal hernia was present.      Patchy mildly erythematous mucosa without bleeding was found in the       entire examined stomach. Biopsies were taken with a cold forceps for       histology and Helicobacter pylori testing.      Normal mucosa was found in the entire duodenum.      A likely extrinsic deformity vs subepithelial lesion was found just       distal to the presumed D4 region in what was felt to be the proximal       jejunum. This area was probed with our biopsy and felt to be hard and       had a negative pillow sign. Area just proximal to this area was tattooed       with an injection of Spot (carbon black) for marking purposes - this may       be the area in question.      The rest of the jejunal mucosa was normal in appearance for at  least       another 20 cm. Area was tattooed with an injection of Spot (carbon       black) to define the extent of today's enteroscopy. Impression:               - Esophageal polypoid lesion was found at 27 cm.                            Resected and retrieved.                           - No other gross lesions in esophagus.                           - Z-line irregular, 40 cm from the incisors.                           - 1 cm hiatal hernia.                           - Erythematous mucosa in the stomach. Biopsied.                           - Normal mucosa was found in the entire examined                            duodenum.                           - Extrinsic impression vs subepithelial lesion just                            distal to the Ligament of Treitz was noted. This                            area seems hard to touch on forceps and had a                            negative pillow sign.  It may be the small bowel                            deformity noted on CT. Tattooed just proximal.                           - Normal mucosa was found in the rest of the                            proximal jejunum. Tattooed distal extent of today's                            procedure. Recommendation:           - The patient will be observed post-procedure,                            until all discharge criteria are met.                           - Discharge patient to home.                           - Patient has a contact number available for                            emergencies. The signs and symptoms of potential                            delayed complications were discussed with the                            patient. Return to normal activities tomorrow.                            Written discharge instructions were provided to the                            patient.                           - Resume previous diet.                           - Await pathology results.                            - It is possible that the area in question was                            reached and felt to be subepithelial vs extrinsic                            impression on today's enteroscopy. As distal as we  got today, I am not sure that if that area we noted                            is not the lesion that single balloon enteroscopy                            would reach. If any further question if not going                            to surgery for lesion then Double Balloon                            enteroscopy could be required and would have to be                            done at [facility]. I think evaluation of the small bowel                            and running it will likely be the way of further                            evaluation and hopefully the tattoo will have                            helped this for our surgical colleagues.                           - Follow up with surgical colleagues as to next                            steps in evaluation of the enlarging small bowel                            lesion.                           - The findings and recommendations were discussed                            with the patient. Procedure Code(s):        --- Professional ---                           [id number], Small intestinal endoscopy, enteroscopy                            beyond second portion of duodenum, not including                            ileum; with removal of tumor(s), polyp(s), or other                            lesion(s) by snare technique  44799, Unlisted procedure, small intestine Diagnosis Code(s):        --- Professional ---                           K22.8, Other specified diseases of esophagus                           K44.9, Diaphragmatic hernia without obstruction or                            gangrene                           K31.89, Other diseases of stomach and duodenum                            K63.89, Other specified diseases of intestine                           R10.84, Generalized abdominal pain                           R93.3, Abnormal findings on diagnostic imaging of                            other parts of digestive tract CPT copyright 2019 American Medical Association. All rights reserved. The codes documented in this report are preliminary and upon coder review may  be revised to meet current compliance requirements. Justice Britain, MD 07/25/2021 12:29:58 PM Number of Addenda: 0

## 2021-07-25 NOTE — Anesthesia Postprocedure Evaluation (Signed)
Anesthesia Post Note  Patient: Julie Williams  Procedure(s) Performed: ENTEROSCOPY POLYPECTOMY SUBMUCOSAL TATTOO INJECTION     Patient location during evaluation: PACU Anesthesia Type: General Level of consciousness: sedated Pain management: pain level controlled Vital Signs Assessment: post-procedure vital signs reviewed and stable Respiratory status: spontaneous breathing and respiratory function stable Cardiovascular status: stable Postop Assessment: no apparent nausea or vomiting Anesthetic complications: no   No notable events documented.  Last Vitals:  Vitals:   07/25/21 1210 07/25/21 1221  BP: (!) 140/96 (!) 141/97  Pulse: 78 74  Resp: 13 10  Temp:    SpO2: 97% 95%    Last Pain:  Vitals:   07/25/21 1221  TempSrc:   PainSc: 0-No pain                 [name] DANIEL

## 2021-07-25 NOTE — Anesthesia Preprocedure Evaluation (Addendum)
Anesthesia Evaluation  Patient identified by MRN, date of birth, ID band Patient awake    Reviewed: Allergy & Precautions, NPO status , Patient's Chart, lab work & pertinent test results  History of Anesthesia Complications Negative for: history of anesthetic complications  Airway Mallampati: II  TM Distance: >3 FB Neck ROM: Full    Dental no notable dental hx. (+) Dental Advisory Given   Pulmonary pneumonia, resolved, Current Smoker,    Pulmonary exam normal        Cardiovascular hypertension, Pt. on medications Normal cardiovascular exam  CV: Stress Echo 02/16/2018 Study Conclusions  - Stress ECG conclusions: The stress ECG was normal. - Staged echo: Normal echo stress   Neuro/Psych PSYCHIATRIC DISORDERS Depression negative neurological ROS     GI/Hepatic Neg liver ROS, PUD, GERD  Medicated,  Endo/Other  Hypothyroidism   Renal/GU negative Renal ROS     Musculoskeletal  (+) Arthritis , Osteoarthritis,    Abdominal   Peds  Hematology negative hematology ROS (+)   Anesthesia Other Findings Day of surgery medications reviewed with the patient.  Reproductive/Obstetrics                            Anesthesia Physical  Anesthesia Plan  ASA: 3  Anesthesia Plan: General   Post-op Pain Management: Minimal or no pain anticipated   Induction: Intravenous  PONV Risk Score and Plan: 3 and Ondansetron, Propofol infusion and Midazolam  Airway Management Planned: Oral ETT  Additional Equipment:   Intra-op Plan:   Post-operative Plan: Extubation in OR  Informed Consent: I have reviewed the patients History and Physical, chart, labs and discussed the procedure including the risks, benefits and alternatives for the proposed anesthesia with the patient or authorized representative who has indicated his/her understanding and acceptance.     Dental advisory given  Plan Discussed with:  Anesthesiologist, CRNA and Surgeon  Anesthesia Plan Comments:       Anesthesia Quick Evaluation

## 2021-07-26 ENCOUNTER — Encounter: Payer: Self-pay | Admitting: Gastroenterology

## 2021-07-26 LAB — SURGICAL PATHOLOGY

## 2021-07-27 ENCOUNTER — Encounter (HOSPITAL_COMMUNITY): Payer: Self-pay | Admitting: Gastroenterology

## 2021-08-15 ENCOUNTER — Telehealth: Payer: Self-pay | Admitting: Gastroenterology

## 2021-08-15 NOTE — Telephone Encounter (Signed)
The pt has been advised of the information in the letter regarding the path.  She states she did receive the letter but was unsure what the next steps are.  She was advised that she should follow up with the surgeon in regards to the small bowel lesion.  She will call now and also asked for a copy of the report to be mailed to her home.  I have placed that in the mail today.

## 2021-08-15 NOTE — Telephone Encounter (Signed)
Inbound call from patient requesting a call back to discuss her results from procedure 11/30

## 2021-09-14 NOTE — Progress Notes (Signed)
I spoke with Ms Hoeppner and offered her a consultation with Dr Burr Medico for Tuesday 1/24.  Ms Gradel asked me what a medical oncologists does.  I explained the role of a medical oncologist.  She declined this consult and states she wanted a surgical second opinion. I told her that referral will need to be completed by Dr Lalla Brothers.  Dr Lalla Brothers and Dr Burr Medico notified via in [employer].

## 2021-09-14 NOTE — Progress Notes (Signed)
Ms Strider left vm stating she does want a consultation with Dr Burr Medico.  I will schedule for 1/24 at 1020.  I left a vm for her with appt date, time, location.  I asked that she arrive at 1005 for registration purposes.

## 2021-09-18 ENCOUNTER — Ambulatory Visit
Admission: RE | Admit: 2021-09-18 | Discharge: 2021-09-18 | Disposition: A | Discharge status: Home / Self Care | Payer: Self-pay | Source: Ambulatory Visit | Attending: Hematology | Admitting: Hematology

## 2021-09-18 ENCOUNTER — Inpatient Hospital Stay: Payer: Commercial Managed Care - HMO | Attending: Hematology | Admitting: Hematology

## 2021-09-18 ENCOUNTER — Other Ambulatory Visit: Payer: Self-pay

## 2021-09-18 ENCOUNTER — Encounter: Payer: Self-pay | Admitting: Hematology

## 2021-09-18 DIAGNOSIS — R590 Localized enlarged lymph nodes: Secondary | ICD-10-CM | POA: Insufficient documentation

## 2021-09-18 DIAGNOSIS — F17210 Nicotine dependence, cigarettes, uncomplicated: Secondary | ICD-10-CM | POA: Diagnosis not present

## 2021-09-18 DIAGNOSIS — R1900 Intra-abdominal and pelvic swelling, mass and lump, unspecified site: Secondary | ICD-10-CM | POA: Insufficient documentation

## 2021-09-18 DIAGNOSIS — K6389 Other specified diseases of intestine: Secondary | ICD-10-CM | POA: Diagnosis not present

## 2021-09-18 DIAGNOSIS — G8929 Other chronic pain: Secondary | ICD-10-CM | POA: Insufficient documentation

## 2021-09-18 DIAGNOSIS — R109 Unspecified abdominal pain: Secondary | ICD-10-CM | POA: Insufficient documentation

## 2021-09-18 DIAGNOSIS — Z79899 Other long term (current) drug therapy: Secondary | ICD-10-CM | POA: Diagnosis not present

## 2021-09-18 DIAGNOSIS — R599 Enlarged lymph nodes, unspecified: Secondary | ICD-10-CM | POA: Diagnosis not present
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
## 2021-10-03 NOTE — Progress Notes (Signed)
Surgical Instructions    Your procedure is scheduled on 10/09/21.  Report to [facility] Main Entrance "A" at 11:00 A.M., then check in with the Admitting office.  Call this number if you have problems the morning of surgery:  [phone]   If you have any questions prior to your surgery date call [phone]: Open Monday-Friday 8am-4pm    Remember:  Do not eat after midnight the night before your surgery  You may drink clear liquids until 10:00am the morning of your surgery.   Clear liquids allowed are: Water, Non-Citrus Juices (without pulp), Carbonated Beverages, Clear Tea, Black Coffee ONLY (NO MILK, CREAM OR POWDERED CREAMER of any kind), and Gatorade    Take these medicines the morning of surgery with A SIP OF WATER:  amLODipine (NORVASC)  levothyroxine (SYNTHROID)  omeprazole (PRILOSEC)  vortioxetine HBr (TRINTELLIX)   IF NEEDED: EPINEPHrine pen morphine (MSIR)  As of today, STOP taking any Aspirin (unless otherwise instructed by your surgeon) Aleve, Naproxen, Ibuprofen, Motrin, Advil, Goody's, BC's, all herbal medications, fish oil, and all vitamins.           Do not wear jewelry or makeup Do not wear lotions, powders, perfumes, or deodorant. Do not shave 48 hours prior to surgery.  Do not bring valuables to the hospital. Do not wear nail polish, gel polish, artificial nails, or any other type of covering on natural nails (fingers and toes) If you have artificial nails or gel coating that need to be removed by a nail salon, please have this removed prior to surgery. Artificial nails or gel coating may interfere with anesthesia's ability to adequately monitor your vital signs.  [facility] is not responsible for any belongings or valuables. .   Do NOT Smoke (Tobacco/Vaping)  24 hours prior to your procedure  If you use a CPAP at night, you may bring your mask for your overnight stay.   Contacts, glasses, hearing aids, dentures or partials may not be worn into  surgery, please bring cases for these belongings   For patients admitted to the hospital, discharge time will be determined by your treatment team.   Patients discharged the day of surgery will not be allowed to drive home, and someone needs to stay with them for 24 hours.  NO VISITORS WILL BE ALLOWED IN PRE-OP WHERE PATIENTS ARE PREPPED FOR SURGERY.  ONLY 1 SUPPORT PERSON MAY BE PRESENT IN THE WAITING ROOM WHILE YOU ARE IN SURGERY.  IF YOU ARE TO BE ADMITTED, ONCE YOU ARE IN YOUR ROOM YOU WILL BE ALLOWED TWO (2) VISITORS. 1 (ONE) VISITOR MAY STAY OVERNIGHT BUT MUST ARRIVE TO THE ROOM BY 8pm.  Minor children may have two parents present. Special consideration for safety and communication needs will be reviewed on a case by case basis.  Special instructions:    Oral Hygiene is also important to reduce your risk of infection.  Remember - BRUSH YOUR TEETH THE MORNING OF SURGERY WITH YOUR REGULAR TOOTHPASTE   Julie Williams- Preparing For Surgery  Before surgery, you can play an important role. Because skin is not sterile, your skin needs to be as free of germs as possible. You can reduce the number of germs on your skin by washing with CHG (chlorahexidine gluconate) Soap before surgery.  CHG is an antiseptic cleaner which kills germs and bonds with the skin to continue killing germs even after washing.     Please do not use if you have an allergy to CHG or antibacterial soaps. If  your skin becomes reddened/irritated stop using the CHG.  Do not shave (including legs and underarms) for at least 48 hours prior to first CHG shower. It is OK to shave your face.  Please follow these instructions carefully.     Shower the NIGHT BEFORE SURGERY and the MORNING OF SURGERY with CHG Soap.   If you chose to wash your hair, wash your hair first as usual with your normal shampoo. After you shampoo, rinse your hair and body thoroughly to remove the shampoo.  Then [employer] and genitals (private parts) with your  normal soap and rinse thoroughly to remove soap.  After that Use CHG Soap as you would any other liquid soap. You can apply CHG directly to the skin and wash gently with a scrungie or a clean washcloth.   Apply the CHG Soap to your body ONLY FROM THE NECK DOWN.  Do not use on open wounds or open sores. Avoid contact with your eyes, ears, mouth and genitals (private parts). Wash Face and genitals (private parts)  with your normal soap.   Wash thoroughly, paying special attention to the area where your surgery will be performed.  Thoroughly rinse your body with warm water from the neck down.  DO NOT shower/wash with your normal soap after using and rinsing off the CHG Soap.  Pat yourself dry with a CLEAN TOWEL.  Wear CLEAN PAJAMAS to bed the night before surgery  Place CLEAN SHEETS on your bed the night before your surgery  DO NOT SLEEP WITH PETS.   Day of Surgery: Take a shower with CHG soap. Wear Clean/Comfortable clothing the morning of surgery Do not apply any deodorants/lotions.   Remember to brush your teeth WITH YOUR REGULAR TOOTHPASTE.    COVID testing  If you are going to stay overnight or be admitted after your procedure/surgery and require a pre-op COVID test, please follow these instructions after your COVID test   You are not required to quarantine however you are required to wear a well-fitting mask when you are out and around people not in your household.  If your mask becomes wet or soiled, replace with a new one.  Wash your hands often with soap and water for 20 seconds or clean your hands with an alcohol-based hand sanitizer that contains at least 60% alcohol.  Do not share personal items.  Notify your provider: if you are in close contact with someone who has COVID  or if you develop a fever of 100.4 or greater, sneezing, cough, sore throat, shortness of breath or body aches.    Please read over the following fact sheets that you were given.

## 2021-10-04 ENCOUNTER — Encounter (HOSPITAL_COMMUNITY): Payer: Self-pay

## 2021-10-04 ENCOUNTER — Ambulatory Visit: Payer: Self-pay | Admitting: Surgery

## 2021-10-04 ENCOUNTER — Other Ambulatory Visit: Payer: Self-pay

## 2021-10-04 ENCOUNTER — Encounter (HOSPITAL_COMMUNITY)
Admission: RE | Admit: 2021-10-04 | Discharge: 2021-10-04 | Disposition: A | Discharge status: Home / Self Care | Payer: Medicare Other | Source: Ambulatory Visit | Attending: Surgery | Admitting: Surgery

## 2021-10-04 VITALS — BP 130/84 | HR 82 | Temp 98.4°F | Resp 17 | Ht 65.0 in | Wt 135.2 lb

## 2021-10-04 DIAGNOSIS — Z9049 Acquired absence of other specified parts of digestive tract: Secondary | ICD-10-CM | POA: Diagnosis not present

## 2021-10-04 DIAGNOSIS — Z79891 Long term (current) use of opiate analgesic: Secondary | ICD-10-CM | POA: Insufficient documentation

## 2021-10-04 DIAGNOSIS — Z8719 Personal history of other diseases of the digestive system: Secondary | ICD-10-CM | POA: Diagnosis not present

## 2021-10-04 DIAGNOSIS — K639 Disease of intestine, unspecified: Secondary | ICD-10-CM | POA: Diagnosis not present

## 2021-10-04 DIAGNOSIS — K432 Incisional hernia without obstruction or gangrene: Secondary | ICD-10-CM | POA: Insufficient documentation

## 2021-10-04 DIAGNOSIS — Z01812 Encounter for preprocedural laboratory examination: Secondary | ICD-10-CM | POA: Diagnosis present

## 2021-10-04 DIAGNOSIS — Z01818 Encounter for other preprocedural examination: Secondary | ICD-10-CM

## 2021-10-04 LAB — CBC
HCT: 43.6 % (ref 36.0–46.0)
Hemoglobin: 14.2 g/dL (ref 12.0–15.0)
MCH: 32.5 pg (ref 26.0–34.0)
MCHC: 32.6 g/dL (ref 30.0–36.0)
MCV: 99.8 fL (ref 80.0–100.0)
Platelets: 203 10*3/uL (ref 150–400)
RBC: 4.37 MIL/uL (ref 3.87–5.11)
RDW: 12 % (ref 11.5–15.5)
WBC: 8 10*3/uL (ref 4.0–10.5)
nRBC: 0 % (ref 0.0–0.2)

## 2021-10-04 LAB — BASIC METABOLIC PANEL
Anion gap: 9 (ref 5–15)
BUN: 16 mg/dL (ref 6–20)
CO2: 30 mmol/L (ref 22–32)
Calcium: 9.5 mg/dL (ref 8.9–10.3)
Chloride: 100 mmol/L (ref 98–111)
Creatinine, Ser: 0.84 mg/dL (ref 0.44–1.00)
GFR, Estimated: >60 mL/min (ref >60)
Glucose, Bld: 115 mg/dL — ABNORMAL HIGH (ref 70–99)
Potassium: 4.3 mmol/L (ref 3.5–5.1)
Sodium: 139 mmol/L (ref 135–145)

## 2021-10-04 LAB — SURGICAL PCR SCREEN
MRSA, PCR: POSITIVE — AB
Staphylococcus aureus: POSITIVE — AB

## 2021-10-04 NOTE — Progress Notes (Signed)
Surgical Instructions    Your procedure is scheduled on 10/09/21.  Report to [facility] Main Entrance "A" at 10:00 A.M., then check in with the Admitting office.  Call this number if you have problems the morning of surgery:  [phone]   If you have any questions prior to your surgery date call [phone]: Open Monday-Friday 8am-4pm    Remember:  Do not eat after midnight the night before your surgery  You may drink clear liquids until 10:00am the morning of your surgery.   Clear liquids allowed are: Water, Non-Citrus Juices (without pulp), Carbonated Beverages, Clear Tea, Black Coffee ONLY (NO MILK, CREAM OR POWDERED CREAMER of any kind), and Gatorade    Take these medicines the morning of surgery with A SIP OF WATER:  amLODipine (NORVASC)  levothyroxine (SYNTHROID)  omeprazole (PRILOSEC)  vortioxetine HBr (TRINTELLIX)   IF NEEDED: EPINEPHrine pen morphine (MSIR)  As of today, STOP taking any Aspirin (unless otherwise instructed by your surgeon) Aleve, Naproxen, Ibuprofen, Motrin, Advil, Goody's, BC's, all herbal medications, fish oil, and all vitamins.           Do not wear jewelry or makeup Do not wear lotions, powders, perfumes, or deodorant. Do not shave 48 hours prior to surgery.  Do not bring valuables to the hospital. Do not wear nail polish, gel polish, artificial nails, or any other type of covering on natural nails (fingers and toes) If you have artificial nails or gel coating that need to be removed by a nail salon, please have this removed prior to surgery. Artificial nails or gel coating may interfere with anesthesia's ability to adequately monitor your vital signs.  [facility] is not responsible for any belongings or valuables. .   Do NOT Smoke (Tobacco/Vaping)  24 hours prior to your procedure  If you use a CPAP at night, you may bring your mask for your overnight stay.   Contacts, glasses, hearing aids, dentures or partials may not be worn into  surgery, please bring cases for these belongings   For patients admitted to the hospital, discharge time will be determined by your treatment team.   Patients discharged the day of surgery will not be allowed to drive home, and someone needs to stay with them for 24 hours.  NO VISITORS WILL BE ALLOWED IN PRE-OP WHERE PATIENTS ARE PREPPED FOR SURGERY.  ONLY 1 SUPPORT PERSON MAY BE PRESENT IN THE WAITING ROOM WHILE YOU ARE IN SURGERY.  IF YOU ARE TO BE ADMITTED, ONCE YOU ARE IN YOUR ROOM YOU WILL BE ALLOWED TWO (2) VISITORS. 1 (ONE) VISITOR MAY STAY OVERNIGHT BUT MUST ARRIVE TO THE ROOM BY 8pm.  Minor children may have two parents present. Special consideration for safety and communication needs will be reviewed on a case by case basis.  Special instructions:    Oral Hygiene is also important to reduce your risk of infection.  Remember - BRUSH YOUR TEETH THE MORNING OF SURGERY WITH YOUR REGULAR TOOTHPASTE   [facility]- Preparing For Surgery  Before surgery, you can play an important role. Because skin is not sterile, your skin needs to be as free of germs as possible. You can reduce the number of germs on your skin by washing with CHG (chlorahexidine gluconate) Soap before surgery.  CHG is an antiseptic cleaner which kills germs and bonds with the skin to continue killing germs even after washing.     Please do not use if you have an allergy to CHG or antibacterial soaps. If  your skin becomes reddened/irritated stop using the CHG.  Do not shave (including legs and underarms) for at least 48 hours prior to first CHG shower. It is OK to shave your face.  Please follow these instructions carefully.     Shower the NIGHT BEFORE SURGERY and the MORNING OF SURGERY with CHG Soap.   If you chose to wash your hair, wash your hair first as usual with your normal shampoo. After you shampoo, rinse your hair and body thoroughly to remove the shampoo.  Then [employer] and genitals (private parts) with your  normal soap and rinse thoroughly to remove soap.  After that Use CHG Soap as you would any other liquid soap. You can apply CHG directly to the skin and wash gently with a scrungie or a clean washcloth.   Apply the CHG Soap to your body ONLY FROM THE NECK DOWN.  Do not use on open wounds or open sores. Avoid contact with your eyes, ears, mouth and genitals (private parts). Wash Face and genitals (private parts)  with your normal soap.   Wash thoroughly, paying special attention to the area where your surgery will be performed.  Thoroughly rinse your body with warm water from the neck down.  DO NOT shower/wash with your normal soap after using and rinsing off the CHG Soap.  Pat yourself dry with a CLEAN TOWEL.  Wear CLEAN PAJAMAS to bed the night before surgery  Place CLEAN SHEETS on your bed the night before your surgery  DO NOT SLEEP WITH PETS.   Day of Surgery: Take a shower with CHG soap. Wear Clean/Comfortable clothing the morning of surgery Do not apply any deodorants/lotions.   Remember to brush your teeth WITH YOUR REGULAR TOOTHPASTE.    COVID testing  If you are going to stay overnight or be admitted after your procedure/surgery and require a pre-op COVID test, please follow these instructions after your COVID test   You are not required to quarantine however you are required to wear a well-fitting mask when you are out and around people not in your household.  If your mask becomes wet or soiled, replace with a new one.  Wash your hands often with soap and water for 20 seconds or clean your hands with an alcohol-based hand sanitizer that contains at least 60% alcohol.  Do not share personal items.  Notify your provider: if you are in close contact with someone who has COVID  or if you develop a fever of 100.4 or greater, sneezing, cough, sore throat, shortness of breath or body aches.    Please read over the following fact sheets that you were given.

## 2021-10-04 NOTE — Progress Notes (Addendum)
Called [facility], spoke to [place] in nurse triage. Requested MD orders for surgery, also asked if pt needed bowel prep.  Per Elmyra Ricks, she left message with Dr. Robby Sermon nurse.   Addendum: Return call from [facility], pt will not need a bowel prep.  MD to place surgical orders.

## 2021-10-04 NOTE — Progress Notes (Signed)
Left a voicemail for patient stating that I have made a COVID test appt for 10/05/21. Also informed her that her arrival time the DOS will now be 11am. Left unit phone number for any further questions .

## 2021-10-04 NOTE — Progress Notes (Signed)
Notified office of positive MRSA and staph result.

## 2021-10-04 NOTE — Progress Notes (Signed)
PCP: [facility] Cardiologist: Denies   EKG: 05-12-21 CXR: n/a ECHO: 02-16-2018 Stress Test: 02-16-2018 Cardiac Cath: denies  ERAS: Clears until 10am  Pt refused to answer the question regarding accepting a blood transfusion/blood products.  She questioned how thoroughly blood was screened.  A blood transfusion fact sheet was provided. Encouraged to review at home so she could have time to think about her decision before surgery as we will need an answer prior to surgery.  Directed to ask any questions she may have to her doctor.  Covid test: Unable to perform at PAT appt today, too soon for surgical window.  Pt refused to schedule another time prior to surgery, also resistant to arriving 3 hours prior to arrival for Covid test.  Educated on importance of arrival time so test could be resulted. Pt states she will do Covid test DOS.  Pt reviewed consent: will sign DOS after speaking with MD. States the hernia repair also needs to be added. Office contacted, nurse aware.  Pt states neighbor was supposed to bring her, but may decide to drive self. Encouraged pt to utilize neighbor for a ride.  Offered referral to transportation services, but pt refused "to ride in a car with a stranger."  Office also made aware of transportation needs.  Patient denies shortness of breath, fever, cough, and chest pain at PAT appointment.  Patient verbalized understanding of instructions provided today at the PAT appointment.  Patient asked to review instructions at home and day of surgery.

## 2021-10-05 ENCOUNTER — Other Ambulatory Visit (HOSPITAL_COMMUNITY)
Admission: RE | Admit: 2021-10-05 | Discharge: 2021-10-05 | Disposition: A | Discharge status: Home / Self Care | Payer: Medicare Other | Source: Ambulatory Visit | Attending: Surgery | Admitting: Surgery

## 2021-10-05 DIAGNOSIS — Z01818 Encounter for other preprocedural examination: Secondary | ICD-10-CM

## 2021-10-05 DIAGNOSIS — Z20822 Contact with and (suspected) exposure to covid-19: Secondary | ICD-10-CM | POA: Insufficient documentation

## 2021-10-05 DIAGNOSIS — Z01812 Encounter for preprocedural laboratory examination: Secondary | ICD-10-CM | POA: Diagnosis present

## 2021-10-05 LAB — SARS CORONAVIRUS 2 (TAT 6-24 HRS): SARS Coronavirus 2: NEGATIVE
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
## 2021-10-05 NOTE — Anesthesia Preprocedure Evaluation (Addendum)
Anesthesia Evaluation  Patient identified by MRN, date of birth, ID band Patient awake    Reviewed: Allergy & Precautions, NPO status , Patient's Chart, lab work & pertinent test results  History of Anesthesia Complications Negative for: history of anesthetic complications  Airway Mallampati: II  TM Distance: >3 FB Neck ROM: Full    Dental  (+) Dental Advisory Given   Pulmonary Current Smoker and Patient abstained from smoking.,    Pulmonary exam normal        Cardiovascular hypertension, Pt. on medications Normal cardiovascular exam     Neuro/Psych PSYCHIATRIC DISORDERS Depression negative neurological ROS     GI/Hepatic PUD, GERD  Medicated and Controlled,(+)     substance abuse  ,  SB mass    Endo/Other  Hypothyroidism   Renal/GU negative Renal ROS     Musculoskeletal  (+) Arthritis , Osteoarthritis,  narcotic dependent  Abdominal   Peds  Hematology negative hematology ROS (+)   Anesthesia Other Findings   Reproductive/Obstetrics                          Anesthesia Physical Anesthesia Plan  ASA: 3  Anesthesia Plan: General   Post-op Pain Management: Tylenol PO (pre-op), Celebrex PO (pre-op) and Gabapentin PO (pre-op)   Induction: Intravenous  PONV Risk Score and Plan: 4 or greater and Treatment may vary due to age or medical condition, Ondansetron, Midazolam and Dexamethasone  Airway Management Planned: Oral ETT  Additional Equipment: None  Intra-op Plan:   Post-operative Plan: Extubation in OR  Informed Consent: I have reviewed the patients History and Physical, chart, labs and discussed the procedure including the risks, benefits and alternatives for the proposed anesthesia with the patient or authorized representative who has indicated his/her understanding and acceptance.     Dental advisory given  Plan Discussed with: CRNA and Anesthesiologist  Anesthesia  Plan Comments:       Anesthesia Quick Evaluation

## 2021-10-09 ENCOUNTER — Inpatient Hospital Stay (HOSPITAL_COMMUNITY)
Admission: RE | Admit: 2021-10-09 | Discharge: 2021-10-13 | DRG: 330 | Disposition: A | Discharge status: Home / Self Care | Payer: Medicare Other | Attending: Surgery | Admitting: Surgery

## 2021-10-09 ENCOUNTER — Inpatient Hospital Stay (HOSPITAL_COMMUNITY): Payer: Medicare Other | Admitting: Vascular Surgery

## 2021-10-09 ENCOUNTER — Other Ambulatory Visit: Payer: Self-pay

## 2021-10-09 ENCOUNTER — Encounter (HOSPITAL_COMMUNITY): Payer: Self-pay | Admitting: Surgery

## 2021-10-09 ENCOUNTER — Inpatient Hospital Stay (HOSPITAL_COMMUNITY): Payer: Medicare Other | Admitting: Physician Assistant

## 2021-10-09 ENCOUNTER — Encounter (HOSPITAL_COMMUNITY)
Admission: RE | Disposition: A | Discharge status: Home / Self Care | Payer: Self-pay | Source: Home / Self Care | Attending: Surgery

## 2021-10-09 DIAGNOSIS — N179 Acute kidney failure, unspecified: Secondary | ICD-10-CM | POA: Diagnosis not present

## 2021-10-09 DIAGNOSIS — Z800 Family history of malignant neoplasm of digestive organs: Secondary | ICD-10-CM

## 2021-10-09 DIAGNOSIS — F1120 Opioid dependence, uncomplicated: Secondary | ICD-10-CM | POA: Diagnosis present

## 2021-10-09 DIAGNOSIS — Z79899 Other long term (current) drug therapy: Secondary | ICD-10-CM | POA: Diagnosis not present

## 2021-10-09 DIAGNOSIS — I10 Essential (primary) hypertension: Secondary | ICD-10-CM | POA: Diagnosis present

## 2021-10-09 DIAGNOSIS — K660 Peritoneal adhesions (postprocedural) (postinfection): Secondary | ICD-10-CM | POA: Diagnosis present

## 2021-10-09 DIAGNOSIS — K432 Incisional hernia without obstruction or gangrene: Secondary | ICD-10-CM | POA: Diagnosis present

## 2021-10-09 DIAGNOSIS — Z79890 Hormone replacement therapy (postmenopausal): Secondary | ICD-10-CM

## 2021-10-09 DIAGNOSIS — F17210 Nicotine dependence, cigarettes, uncomplicated: Secondary | ICD-10-CM | POA: Diagnosis present

## 2021-10-09 DIAGNOSIS — E039 Hypothyroidism, unspecified: Secondary | ICD-10-CM | POA: Diagnosis present

## 2021-10-09 DIAGNOSIS — K6389 Other specified diseases of intestine: Secondary | ICD-10-CM | POA: Diagnosis present

## 2021-10-09 DIAGNOSIS — K631 Perforation of intestine (nontraumatic): Principal | ICD-10-CM | POA: Diagnosis present

## 2021-10-09 DIAGNOSIS — K219 Gastro-esophageal reflux disease without esophagitis: Secondary | ICD-10-CM | POA: Diagnosis present

## 2021-10-09 DIAGNOSIS — G894 Chronic pain syndrome: Secondary | ICD-10-CM | POA: Diagnosis present

## 2021-10-09 DIAGNOSIS — Z8249 Family history of ischemic heart disease and other diseases of the circulatory system: Secondary | ICD-10-CM

## 2021-10-09 HISTORY — PX: LYSIS OF ADHESION: SHX5961

## 2021-10-09 HISTORY — PX: EPIGASTRIC HERNIA REPAIR: SHX404

## 2021-10-09 HISTORY — PX: MASS EXCISION: SHX2000

## 2021-10-09 LAB — CBC
HCT: 40.3 % (ref 36.0–46.0)
Hemoglobin: 12.7 g/dL (ref 12.0–15.0)
MCH: 32.1 pg (ref 26.0–34.0)
MCHC: 31.5 g/dL (ref 30.0–36.0)
MCV: 101.8 fL — ABNORMAL HIGH (ref 80.0–100.0)
Platelets: 193 10*3/uL (ref 150–400)
RBC: 3.96 MIL/uL (ref 3.87–5.11)
RDW: 12.4 % (ref 11.5–15.5)
WBC: 9.4 10*3/uL (ref 4.0–10.5)
nRBC: 0 % (ref 0.0–0.2)

## 2021-10-09 LAB — CREATININE, SERUM
Creatinine, Ser: 0.74 mg/dL (ref 0.44–1.00)
GFR, Estimated: >60 mL/min (ref >60)

## 2021-10-09 SURGERY — EXCISION MASS
Anesthesia: General

## 2021-10-09 MED ORDER — GABAPENTIN 600 MG PO TABS
300.0000 mg | ORAL_TABLET | Freq: Three times a day (TID) | ORAL | Status: DC
  Filled 2021-10-09 (×3): qty 1

## 2021-10-09 MED ORDER — CELECOXIB 200 MG PO CAPS
400.0000 mg | ORAL_CAPSULE | ORAL | Status: AC
  Administered 2021-10-09: 400 mg via ORAL
  Filled 2021-10-09: qty 2

## 2021-10-09 MED ORDER — MORPHINE SULFATE 15 MG PO TABS
30.0000 mg | ORAL_TABLET | Freq: Once | ORAL | Status: AC
  Administered 2021-10-09: 30 mg via ORAL
  Filled 2021-10-09: qty 2

## 2021-10-09 MED ORDER — GABAPENTIN 300 MG PO CAPS
300.0000 mg | ORAL_CAPSULE | ORAL | Status: AC
  Administered 2021-10-09: 300 mg via ORAL
  Filled 2021-10-09: qty 1

## 2021-10-09 MED ORDER — OXYCODONE HCL 5 MG/5ML PO SOLN: 5.0000 mg | Freq: Once | ORAL | Status: DC | PRN

## 2021-10-09 MED ORDER — CHLORHEXIDINE GLUCONATE 0.12 % MT SOLN
15.0000 mL | Freq: Once | OROMUCOSAL | Status: AC
  Administered 2021-10-09: 15 mL via OROMUCOSAL
  Filled 2021-10-09: qty 15

## 2021-10-09 MED ORDER — HYDROMORPHONE HCL 1 MG/ML IJ SOLN
INTRAMUSCULAR | Status: DC | PRN
Start: 2021-10-09 — End: 2021-10-09
  Administered 2021-10-09 (×2): .5 mg via INTRAVENOUS

## 2021-10-09 MED ORDER — HYDROMORPHONE HCL 1 MG/ML IJ SOLN
INTRAMUSCULAR | Status: AC
  Filled 2021-10-09: qty 0.5

## 2021-10-09 MED ORDER — LACTATED RINGERS IV SOLN: INTRAVENOUS | Status: DC

## 2021-10-09 MED ORDER — HYDRALAZINE HCL 20 MG/ML IJ SOLN
INTRAMUSCULAR | Status: AC
  Filled 2021-10-09: qty 1

## 2021-10-09 MED ORDER — ONDANSETRON HCL 4 MG/2ML IJ SOLN
INTRAMUSCULAR | Status: DC | PRN
  Administered 2021-10-09: 4 mg via INTRAVENOUS

## 2021-10-09 MED ORDER — LIDOCAINE 2% (20 MG/ML) 5 ML SYRINGE
INTRAMUSCULAR | Status: AC
  Filled 2021-10-09: qty 5

## 2021-10-09 MED ORDER — OXYCODONE HCL 5 MG PO TABS
5.0000 mg | ORAL_TABLET | ORAL | Status: DC | PRN
  Administered 2021-10-11: 5 mg via ORAL
  Filled 2021-10-09 (×2): qty 1

## 2021-10-09 MED ORDER — ORAL CARE MOUTH RINSE: 15.0000 mL | Freq: Once | OROMUCOSAL | Status: AC

## 2021-10-09 MED ORDER — SUGAMMADEX SODIUM 200 MG/2ML IV SOLN
INTRAVENOUS | Status: DC | PRN
  Administered 2021-10-09: 200 mg via INTRAVENOUS

## 2021-10-09 MED ORDER — DEXAMETHASONE SODIUM PHOSPHATE 10 MG/ML IJ SOLN
INTRAMUSCULAR | Status: AC
  Filled 2021-10-09: qty 1

## 2021-10-09 MED ORDER — AMLODIPINE BESYLATE 10 MG PO TABS
10.0000 mg | ORAL_TABLET | Freq: Every day | ORAL | Status: DC
  Filled 2021-10-09: qty 1

## 2021-10-09 MED ORDER — CHLORHEXIDINE GLUCONATE CLOTH 2 % EX PADS: 6.0000 | MEDICATED_PAD | Freq: Once | CUTANEOUS | Status: DC

## 2021-10-09 MED ORDER — PHENYLEPHRINE HCL-NACL 20-0.9 MG/250ML-% IV SOLN
INTRAVENOUS | Status: DC | PRN
  Administered 2021-10-09: 20 ug/min via INTRAVENOUS

## 2021-10-09 MED ORDER — BUPIVACAINE LIPOSOME 1.3 % IJ SUSP: 20.0000 mL | Freq: Once | INTRAMUSCULAR | Status: DC

## 2021-10-09 MED ORDER — POLYETHYLENE GLYCOL 3350 17 G PO PACK: 17.0000 g | PACK | Freq: Every day | ORAL | Status: DC | PRN

## 2021-10-09 MED ORDER — KETAMINE HCL 10 MG/ML IJ SOLN
INTRAMUSCULAR | Status: DC | PRN
  Administered 2021-10-09: 25 mg via INTRAVENOUS

## 2021-10-09 MED ORDER — DEXAMETHASONE SODIUM PHOSPHATE 10 MG/ML IJ SOLN
INTRAMUSCULAR | Status: DC | PRN
  Administered 2021-10-09: 10 mg via INTRAVENOUS

## 2021-10-09 MED ORDER — HYDRALAZINE HCL 20 MG/ML IJ SOLN
INTRAMUSCULAR | Status: DC | PRN
  Administered 2021-10-09: 10 mg via INTRAVENOUS

## 2021-10-09 MED ORDER — METHOCARBAMOL 1000 MG/10ML IJ SOLN
500.0000 mg | Freq: Four times a day (QID) | INTRAVENOUS | Status: DC | PRN
  Administered 2021-10-11: 500 mg via INTRAVENOUS
  Filled 2021-10-09 (×2): qty 5
  Filled 2021-10-09: qty 500

## 2021-10-09 MED ORDER — FENTANYL CITRATE (PF) 250 MCG/5ML IJ SOLN
INTRAMUSCULAR | Status: AC
  Filled 2021-10-09: qty 5

## 2021-10-09 MED ORDER — PHENOL 1.4 % MT LIQD: 1.0000 | OROMUCOSAL | Status: DC | PRN

## 2021-10-09 MED ORDER — ACETAMINOPHEN 325 MG PO TABS
650.0000 mg | ORAL_TABLET | Freq: Four times a day (QID) | ORAL | Status: DC
  Administered 2021-10-09 – 2021-10-13 (×11): 650 mg via ORAL
  Filled 2021-10-09 (×13): qty 2

## 2021-10-09 MED ORDER — LEVOTHYROXINE SODIUM 25 MCG PO TABS
25.0000 ug | ORAL_TABLET | Freq: Every day | ORAL | Status: DC
  Administered 2021-10-10 – 2021-10-13 (×4): 25 ug via ORAL
  Filled 2021-10-09 (×4): qty 1

## 2021-10-09 MED ORDER — KETOROLAC TROMETHAMINE 15 MG/ML IJ SOLN
15.0000 mg | Freq: Three times a day (TID) | INTRAMUSCULAR | Status: DC
  Administered 2021-10-09 – 2021-10-11 (×5): 15 mg via INTRAVENOUS
  Filled 2021-10-09 (×5): qty 1

## 2021-10-09 MED ORDER — ROCURONIUM BROMIDE 10 MG/ML (PF) SYRINGE
PREFILLED_SYRINGE | INTRAVENOUS | Status: AC
  Filled 2021-10-09: qty 10

## 2021-10-09 MED ORDER — DOCUSATE SODIUM 100 MG PO CAPS
100.0000 mg | ORAL_CAPSULE | Freq: Two times a day (BID) | ORAL | Status: DC
  Administered 2021-10-09 – 2021-10-12 (×6): 100 mg via ORAL
  Filled 2021-10-09 (×7): qty 1

## 2021-10-09 MED ORDER — ACETAMINOPHEN 500 MG PO TABS
1000.0000 mg | ORAL_TABLET | ORAL | Status: AC
  Administered 2021-10-09: 1000 mg via ORAL
  Filled 2021-10-09: qty 2

## 2021-10-09 MED ORDER — MORPHINE SULFATE 15 MG PO TABS
30.0000 mg | ORAL_TABLET | Freq: Four times a day (QID) | ORAL | Status: DC | PRN
  Administered 2021-10-09 – 2021-10-10 (×2): 30 mg via ORAL
  Filled 2021-10-09 (×2): qty 2

## 2021-10-09 MED ORDER — OXYCODONE HCL 5 MG PO TABS: 5.0000 mg | ORAL_TABLET | Freq: Once | ORAL | Status: DC | PRN

## 2021-10-09 MED ORDER — ONDANSETRON HCL 4 MG/2ML IJ SOLN
4.0000 mg | Freq: Four times a day (QID) | INTRAMUSCULAR | Status: DC | PRN
  Administered 2021-10-10: 4 mg via INTRAVENOUS
  Filled 2021-10-09: qty 2

## 2021-10-09 MED ORDER — BISACODYL 5 MG PO TBEC: 5.0000 mg | DELAYED_RELEASE_TABLET | Freq: Every day | ORAL | Status: DC | PRN

## 2021-10-09 MED ORDER — MIDAZOLAM HCL 2 MG/2ML IJ SOLN
INTRAMUSCULAR | Status: DC | PRN
  Administered 2021-10-09: 2 mg via INTRAVENOUS

## 2021-10-09 MED ORDER — PROPOFOL 10 MG/ML IV BOLUS
INTRAVENOUS | Status: AC
  Filled 2021-10-09: qty 20

## 2021-10-09 MED ORDER — 0.9 % SODIUM CHLORIDE (POUR BTL) OPTIME
TOPICAL | Status: DC | PRN
  Administered 2021-10-09: 1000 mL
  Administered 2021-10-09: 2000 mL

## 2021-10-09 MED ORDER — ENOXAPARIN SODIUM 40 MG/0.4ML IJ SOSY
40.0000 mg | PREFILLED_SYRINGE | INTRAMUSCULAR | Status: DC
  Administered 2021-10-10: 40 mg via SUBCUTANEOUS
  Filled 2021-10-09: qty 0.4

## 2021-10-09 MED ORDER — ALBUMIN HUMAN 5 % IV SOLN: INTRAVENOUS | Status: DC | PRN

## 2021-10-09 MED ORDER — CEFAZOLIN SODIUM-DEXTROSE 2-4 GM/100ML-% IV SOLN
2.0000 g | INTRAVENOUS | Status: AC
  Administered 2021-10-09: 2 g via INTRAVENOUS
  Filled 2021-10-09: qty 100

## 2021-10-09 MED ORDER — PANTOPRAZOLE SODIUM 40 MG PO TBEC
40.0000 mg | DELAYED_RELEASE_TABLET | Freq: Every day | ORAL | Status: DC
  Administered 2021-10-10 – 2021-10-12 (×3): 40 mg via ORAL
  Filled 2021-10-09 (×3): qty 1

## 2021-10-09 MED ORDER — FENTANYL CITRATE (PF) 250 MCG/5ML IJ SOLN
INTRAMUSCULAR | Status: DC | PRN
  Administered 2021-10-09 (×3): 50 ug via INTRAVENOUS
  Administered 2021-10-09: 100 ug via INTRAVENOUS

## 2021-10-09 MED ORDER — VORTIOXETINE HBR 5 MG PO TABS
10.0000 mg | ORAL_TABLET | Freq: Every day | ORAL | Status: DC
  Administered 2021-10-10 – 2021-10-12 (×3): 10 mg via ORAL
  Filled 2021-10-09 (×4): qty 2

## 2021-10-09 MED ORDER — DEXMEDETOMIDINE (PRECEDEX) IN NS 20 MCG/5ML (4 MCG/ML) IV SYRINGE
PREFILLED_SYRINGE | INTRAVENOUS | Status: DC | PRN
  Administered 2021-10-09: 8 ug via INTRAVENOUS

## 2021-10-09 MED ORDER — HYDROMORPHONE HCL 1 MG/ML IJ SOLN
0.2500 mg | INTRAMUSCULAR | Status: DC | PRN
  Administered 2021-10-09 (×4): 0.5 mg via INTRAVENOUS

## 2021-10-09 MED ORDER — ONDANSETRON HCL 4 MG/2ML IJ SOLN
INTRAMUSCULAR | Status: AC
  Filled 2021-10-09: qty 2

## 2021-10-09 MED ORDER — PROMETHAZINE HCL 25 MG/ML IJ SOLN: 6.2500 mg | INTRAMUSCULAR | Status: DC | PRN

## 2021-10-09 MED ORDER — SIMETHICONE 80 MG PO CHEW
80.0000 mg | CHEWABLE_TABLET | Freq: Four times a day (QID) | ORAL | Status: DC | PRN
  Administered 2021-10-12: 80 mg via ORAL
  Filled 2021-10-09 (×2): qty 1

## 2021-10-09 MED ORDER — PROCHLORPERAZINE EDISYLATE 10 MG/2ML IJ SOLN: 10.0000 mg | INTRAMUSCULAR | Status: DC | PRN

## 2021-10-09 MED ORDER — OXYCODONE HCL 5 MG PO TABS
10.0000 mg | ORAL_TABLET | ORAL | Status: DC | PRN
  Administered 2021-10-09 – 2021-10-11 (×5): 10 mg via ORAL
  Filled 2021-10-09 (×5): qty 2

## 2021-10-09 MED ORDER — MIDAZOLAM HCL 2 MG/2ML IJ SOLN
INTRAMUSCULAR | Status: AC
  Filled 2021-10-09: qty 2

## 2021-10-09 MED ORDER — METOCLOPRAMIDE HCL 5 MG/ML IJ SOLN
10.0000 mg | Freq: Four times a day (QID) | INTRAMUSCULAR | Status: DC
  Administered 2021-10-09 – 2021-10-13 (×13): 10 mg via INTRAVENOUS
  Filled 2021-10-09 (×15): qty 2

## 2021-10-09 MED ORDER — PIPERACILLIN-TAZOBACTAM 3.375 G IVPB
3.3750 g | Freq: Three times a day (TID) | INTRAVENOUS | Status: AC
  Administered 2021-10-09 – 2021-10-12 (×8): 3.375 g via INTRAVENOUS
  Filled 2021-10-09 (×8): qty 50

## 2021-10-09 MED ORDER — HYDROMORPHONE HCL 1 MG/ML IJ SOLN
1.0000 mg | INTRAMUSCULAR | Status: DC | PRN
  Administered 2021-10-09 – 2021-10-12 (×11): 1 mg via INTRAVENOUS
  Filled 2021-10-09 (×12): qty 1

## 2021-10-09 MED ORDER — HYDROMORPHONE HCL 1 MG/ML IJ SOLN
INTRAMUSCULAR | Status: AC
  Filled 2021-10-09: qty 1

## 2021-10-09 MED ORDER — PROPOFOL 10 MG/ML IV BOLUS
INTRAVENOUS | Status: DC | PRN
  Administered 2021-10-09: 150 mg via INTRAVENOUS

## 2021-10-09 MED ORDER — LIDOCAINE 2% (20 MG/ML) 5 ML SYRINGE
INTRAMUSCULAR | Status: DC | PRN
  Administered 2021-10-09: 40 mg via INTRAVENOUS

## 2021-10-09 MED ORDER — ROCURONIUM BROMIDE 10 MG/ML (PF) SYRINGE
PREFILLED_SYRINGE | INTRAVENOUS | Status: DC | PRN
  Administered 2021-10-09 (×2): 50 mg via INTRAVENOUS
  Administered 2021-10-09: 30 mg via INTRAVENOUS

## 2021-10-09 MED ORDER — KETAMINE HCL 50 MG/5ML IJ SOSY
PREFILLED_SYRINGE | INTRAMUSCULAR | Status: AC
  Filled 2021-10-09: qty 5

## 2021-10-09 MED ORDER — EPINEPHRINE 0.3 MG/0.3ML IJ SOAJ: 0.3000 mg | INTRAMUSCULAR | Status: DC | PRN

## 2021-10-09 SURGICAL SUPPLY — 55 items
ADH SKN CLS APL DERMABOND .7 (GAUZE/BANDAGES/DRESSINGS) ×2
APL PRP STRL LF DISP 70% ISPRP (MISCELLANEOUS) ×4
BAG COUNTER SPONGE SURGICOUNT (BAG) ×3 IMPLANT
BAG SPNG CNTER NS LX DISP (BAG) ×2
CANISTER SUCT 3000ML PPV (MISCELLANEOUS) ×3 IMPLANT
CHLORAPREP W/TINT 26 (MISCELLANEOUS) ×4 IMPLANT
COVER SURGICAL LIGHT HANDLE (MISCELLANEOUS) ×3 IMPLANT
DERMABOND ADVANCED (GAUZE/BANDAGES/DRESSINGS) ×2
DERMABOND ADVANCED .7 DNX12 (GAUZE/BANDAGES/DRESSINGS) IMPLANT
DRAPE LAPAROSCOPIC ABDOMINAL (DRAPES) ×3 IMPLANT
DRAPE WARM FLUID 44X44 (DRAPES) ×3 IMPLANT
DRSG OPSITE POSTOP 4X10 (GAUZE/BANDAGES/DRESSINGS) ×1 IMPLANT
ELECT BLADE 6.5 EXT (BLADE) ×1 IMPLANT
ELECT CAUTERY BLADE 6.4 (BLADE) ×3 IMPLANT
ELECT REM PT RETURN 9FT ADLT (ELECTROSURGICAL) ×3
ELECTRODE REM PT RTRN 9FT ADLT (ELECTROSURGICAL) ×2 IMPLANT
GAUZE 4X4 16PLY ~~LOC~~+RFID DBL (SPONGE) ×1 IMPLANT
GAUZE SPONGE 4X4 12PLY STRL (GAUZE/BANDAGES/DRESSINGS) ×3 IMPLANT
GLOVE SRG 8 PF TXTR STRL LF DI (GLOVE) ×2 IMPLANT
GLOVE SURG ENC MOIS LTX SZ7.5 (GLOVE) ×3 IMPLANT
GLOVE SURG ENC TEXT LTX SZ7.5 (GLOVE) ×6 IMPLANT
GLOVE SURG UNDER LTX SZ8 (GLOVE) ×6 IMPLANT
GLOVE SURG UNDER POLY LF SZ8 (GLOVE) ×3
GOWN STRL REUS W/ TWL LRG LVL3 (GOWN DISPOSABLE) ×4 IMPLANT
GOWN STRL REUS W/TWL 2XL LVL3 (GOWN DISPOSABLE) ×3 IMPLANT
GOWN STRL REUS W/TWL LRG LVL3 (GOWN DISPOSABLE) ×6
HANDLE SUCTION POOLE (INSTRUMENTS) ×2 IMPLANT
KIT BASIN OR (CUSTOM PROCEDURE TRAY) ×3 IMPLANT
KIT TURNOVER KIT B (KITS) ×3 IMPLANT
LIGASURE IMPACT 36 18CM CVD LR (INSTRUMENTS) ×1 IMPLANT
NS IRRIG 1000ML POUR BTL (IV SOLUTION) ×6 IMPLANT
PACK GENERAL/GYN (CUSTOM PROCEDURE TRAY) ×3 IMPLANT
PAD ARMBOARD 7.5X6 YLW CONV (MISCELLANEOUS) ×3 IMPLANT
RELOAD PROXIMATE 75MM BLUE (ENDOMECHANICALS) ×6 IMPLANT
RELOAD STAPLE 75 3.8 BLU REG (ENDOMECHANICALS) IMPLANT
SPECIMEN JAR LARGE (MISCELLANEOUS) ×3 IMPLANT
SPONGE T-LAP 18X18 ~~LOC~~+RFID (SPONGE) ×3 IMPLANT
STAPLER PROXIMATE 75MM BLUE (STAPLE) ×1 IMPLANT
SUCTION POOLE HANDLE (INSTRUMENTS) ×3
SUT ETHILON 2 0 FS 18 (SUTURE) ×1 IMPLANT
SUT PDS AB 2-0 CT1 27 (SUTURE) ×2 IMPLANT
SUT SILK 2 0 SH CR/8 (SUTURE) ×3 IMPLANT
SUT SILK 2 0 TIES 10X30 (SUTURE) ×3 IMPLANT
SUT SILK 3 0 SH CR/8 (SUTURE) ×3 IMPLANT
SUT SILK 3 0 TIES 10X30 (SUTURE) ×3 IMPLANT
SUT VIC AB 2-0 SH 18 (SUTURE) ×4 IMPLANT
SUT VIC AB 2-0 SH 27 (SUTURE) ×12
SUT VIC AB 2-0 SH 27XBRD (SUTURE) IMPLANT
SUT VIC AB 3-0 SH 27 (SUTURE) ×3
SUT VIC AB 3-0 SH 27X BRD (SUTURE) IMPLANT
SYR CONTROL 10ML LL (SYRINGE) ×3 IMPLANT
TOWEL GREEN STERILE (TOWEL DISPOSABLE) ×3 IMPLANT
TOWEL GREEN STERILE FF (TOWEL DISPOSABLE) ×3 IMPLANT
TRAY FOLEY MTR SLVR 14FR STAT (SET/KITS/TRAYS/PACK) ×3 IMPLANT
YANKAUER SUCT BULB TIP NO VENT (SUCTIONS) ×3 IMPLANT

## 2021-10-09 NOTE — Transfer of Care (Signed)
Immediate Anesthesia Transfer of Care Note  Patient: Julie Williams  Procedure(s) Performed: OPEN EXCISION OF SMALL BOWEL MASS PRIMARY CLOSURE OF EPIGASTRSIC INCISIONAL HERNIA LYSIS OF ADHESION x 1 hour  Patient Location: PACU  Anesthesia Type:General  Level of Consciousness: awake and alert   Airway & Oxygen Therapy: Patient Spontanous Breathing and Patient connected to face mask oxygen  Post-op Assessment: Report given to RN and Post -op Vital signs reviewed and stable  Post vital signs: Reviewed and stable  Last Vitals:  Vitals Value Taken Time  BP 138/80 10/09/21 1705  Temp 36.6 C 10/09/21 1705  Pulse 92 10/09/21 1707  Resp 13 10/09/21 1707  SpO2 88 % 10/09/21 1707  Vitals shown include unvalidated device data.  Last Pain:  Vitals:   10/09/21 1125  TempSrc:   PainSc: 7       Patients Stated Pain Goal: 0 (70/11/00 3496)  Complications: No notable events documented.

## 2021-10-09 NOTE — Op Note (Signed)
Patient: Julie Williams ([birth year], [id number])  Date of Surgery: 10/09/2021   Preoperative Diagnosis: Small bowel mass, epigastric incisional hernia   Postoperative Diagnosis: Small bowel mass, epigastric incisional hernia   Surgical Procedure:  OPEN EXCISION OF SMALL BOWEL MASS PRIMARY CLOSURE OF EPIGASTRSIC INCISIONAL HERNIA (3.5 cm wide by 2.8 cm tall) LYSIS OF ADHESION x 1 hour: [id number]   Operative Team Members:  Surgeon(s) and Role:    * Wellington Winegarden, Nickola Major, MD - Primary    * Dwan Bolt, MD - Assisting  Anesthesiologist: Audry Pili, MD; Josephine Igo, MD CRNA: Betha Loa, CRNA; Terrence Dupont, CRNA   Anesthesia: General   Fluids:  Total I/O In: 1350 [I.V.:1000; IV Piggyback:350] Out: 625 [Urine:275; IWLNL:892]  Complications: None  Drains:  Penrose drain in the subcutaneous layer of the closure    Specimen:  ID Type Source Tests Collected by Time Destination  1 : jejunum. stitch=proximal GI Jejunum SURGICAL PATHOLOGY Julie Williams, Nickola Major, MD 10/09/2021 1517      Disposition:  PACU - hemodynamically stable.  Plan of Care: Admit to inpatient     Indications for Procedure:  Julie Williams is a 56 year old female with a hernia and a small bowel mass.  Recurrent incisional hernia It appears the patient's right lower abdominal wall suffered denervation from the component separation performed during her previous incisional hernia repair. She has a large eventration of her right lower abdominal wall. I recommended form fitting clothing or supportive belt to help with these symptoms. I described the placement of a onlay mesh as an option - as a sort of internal girdle, but this would be a large operation with little benefit as her abdominal wall would still not be normal and will continue to bulge where the muscle has atrophied from denervation.  She also has a relatively asymptomatic fat containing epigastric hernia which appears plugged by the  falciform ligament and is unlikely to cause her an acute hernia emergency. As we proceed with operation for her small bowel mass, we will close this primarily at the time of that surgery.  Small bowel mass Recent CT with oral contrast demonstrates a jejunal mass. 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 I recommend open abdominal exploration with resection of intra-abdominal mass and possible small bowel resection. The procedure itself as well as its risks, benefits and alteratives were discussed with the patient in detail. We discussed the pathophysiology of GIST tumors in detail and I provided her with [facility] patient education materials about GIST tumors. We discussed Gleevec for the treatment of GIST tumors, and the need for tissue diagnosis prior to any medical treatments. She is understandably apprehensive about having another open abdominal surgery with her history of complications after colon surgery. She took some time to consider it then decided to proceed with surgery.      Findings:  Chronic abscess cavity from perforation of the jejunum a few inches inferior to the ligament of treitz into the mesentery Fat containing epigastric incisional hernia measuring 3.5 cm wide by 2.8 cm tall   Description of Procedure:   On the date stated above the patient was taken operating room suite and placed supine position.  General endotracheal anesthesia was induced.  A timeout was completed verifying the correct patient, procedure, position, and equipment needed for the case.  The patient's abdomen was prepped and draped in usual sterile fashion.  A Foley catheter was placed.  Ancef was given prior to  the case start.  I began by making a midline upper abdominal laparotomy incision.  We carefully dissected down through the fascia.  The hernia defect was identified and incorporated into the incision.  It measured 3.5 cm wide by 2.8 cm tall on preoperative imaging which seem consistent with  intraoperative findings.  We divided the fascia and attempted to enter the peritoneum, however there was no free peritoneal cavity.  We slowly worked to lyse adhesions.  There were adhesions between all of the intestines and the abdominal wall with no real open free spaces within the abdomen.  The adhesions were more wispy laterally and more dense around the perimeter of the previously placed intraperitoneal phasic's mesh.  Despite being described as absorbable, the phasic's mesh was still evident within the abdomen and the edges had curled down and scarred significantly.  We slowly and meticulously worked our way through the adhesions and attempts to free up the left upper quadrant.  A 1 hour lysis of adhesions ensued until we finally were able to identify the transverse colon with the transverse colon worked our way down the inferior aspect of the transverse colon mesentery and identify some tattoo in this area.  The tattoo had been placed distal to the endoscopic findings at the time of push endoscopy.  We identified this piece of intestine lysed the adhesions between this intestine and the surrounding small bowel and colon.  We freed up a little length of the bowel distal to this to aid in retraction and then we worked proximally.  The mass was evident and felt it was in the mesenteric aspect of the small bowel.  Proximal to the mass laid the ligament of Treitz.  This mass was indeed 1 or 2 cm from the ligament of Treitz.  We took down some of the ligament of Treitz in order to mobilize the fourth portion of the duodenum down into the field.  We then worked to free up the mesentery of the very proximal jejunum.  The mass appeared to be in the mesenteric side of the bowel and during this manipulation the lesion was bluntly entered by the hand that was retracting and purulence spilled into the field.  At this point we realized this was likely a chronic perforation into the mesentery.  The mesenteric aspect of the  jejunum was not visible and did not appear healthy and as this was likely the source of the perforation we decided to proceed with resection of this area.  At this point we decided not to perform a oncologic type resection but instead just focus on getting this abnormal area out.  The GIA stapler was brought onto the field and used to divide the jejunum just proximally and distally to this abscess cavity.  The mesentery of the jejunum was divided using the LigaSure.  The specimen was passed off the field with the proximal aspect of the specimen marked by a stitch.  The duodenum retracted up beneath the colon making the anastomosis quite difficult to perform.  We decided to perform an end-to-end handsewn anastomosis due to the location of this anastomosis.  Hemostasis was obtained with a couple stitches in the mesentery and then the mesenteric defect was closed using running silk suture.  A 2 layer handsewn anastomosis was created using interrupted 2-0 Vicryl suture for the outer seromuscular layer and two 3-0 Vicryl sutures in running fashion for the full-thickness inner layer.  The anastomosis felt patent were complete and we directed our attention to  closure.  The abdomen was irrigated copiously to wash out the chronic abscess cavity fluid did spilled into the field.  The fascia of the midline was closed using running 2-0 PDS suture.  The previous scar was resected for the length of the incision and then the skin was closed.  A quarter inch Penrose drain was placed in the subcutaneous space, 2-0 Vicryl sutures were used to reapproximate the deep dermal tissues and a running 4-0 Monocryl was used to close the skin.  Dermabond was applied.  The Penrose drain was fixed in place using a nylon suture.  A sterile dressing was applied.  All sponge and needle counts were correct at the end of this case.  Patient was transferred the postanesthesia care unit in stable condition.     At the end of the case we reviewed  the infection status of the case. Patient: Private Patient Elective Case Case: Elective Infection Present At Time Of Surgery (PATOS): Infection of the chronic abscess cavity in the jejunal mesentery spilled into the field but was controlled with suction during the case.  Jejunal contents were spilled during creation of the jejunojejunostomy and this was controlled with suciton.   Louanna Raw, MD General, Bariatric, & Minimally Invasive Surgery [facility], [place]
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
## 2021-10-09 NOTE — Anesthesia Procedure Notes (Signed)
Procedure Name: Intubation Date/Time: 10/09/2021 1:54 PM Performed by: Terrence Dupont, CRNA Pre-anesthesia Checklist: Patient identified, Emergency Drugs available, Suction available and Patient being monitored Patient Re-evaluated:Patient Re-evaluated prior to induction Oxygen Delivery Method: Circle system utilized Preoxygenation: Pre-oxygenation with 100% oxygen Induction Type: IV induction Ventilation: Mask ventilation without difficulty Laryngoscope Size: Mac and 3 Grade View: Grade I Tube type: Oral Tube size: 7.0 mm Number of attempts: 1 Airway Equipment and Method: Stylet and Oral airway Placement Confirmation: ETT inserted through vocal cords under direct vision, positive ETCO2 and breath sounds checked- equal and bilateral Secured at: 21 cm Tube secured with: Tape Dental Injury: Teeth and Oropharynx as per pre-operative assessment

## 2021-10-09 NOTE — Progress Notes (Signed)
This chaplain responded to [facility] surgical's page for notarizing the Pt. Living Will before the Pt. procedure. The chaplain appreciates the medical team's assistance in coordinating the notary and witnesses.    The chaplain is present with the Pt., notary, and two witnesses for the notarizing of the Pt. Living Will.  The Pt. was given the original LW along with one copy. A copy was placed in the Pt. Paper Chart.  Chaplain Sallyanne Kuster [phone]

## 2021-10-09 NOTE — Anesthesia Postprocedure Evaluation (Signed)
Anesthesia Post Note  Patient: Julie Williams  Procedure(s) Performed: OPEN EXCISION OF SMALL BOWEL MASS PRIMARY CLOSURE OF EPIGASTRSIC INCISIONAL HERNIA LYSIS OF ADHESION x 1 hour     Patient location during evaluation: PACU Anesthesia Type: General Level of consciousness: awake and alert and oriented Pain management: pain level controlled Vital Signs Assessment: post-procedure vital signs reviewed and stable Respiratory status: spontaneous breathing, nonlabored ventilation and respiratory function stable Cardiovascular status: blood pressure returned to baseline and stable Postop Assessment: no apparent nausea or vomiting Anesthetic complications: no   No notable events documented.  Last Vitals:  Vitals:   10/09/21 1735 10/09/21 1750  BP: 114/80 113/81  Pulse: 80 81  Resp: 13 13  Temp:    SpO2: 98% 96%    Last Pain:  Vitals:   10/09/21 1750  TempSrc:   PainSc: 10-Worst pain ever                 Ariya Bohannon A.

## 2021-10-10 ENCOUNTER — Encounter (HOSPITAL_COMMUNITY): Payer: Self-pay | Admitting: Surgery

## 2021-10-10 ENCOUNTER — Other Ambulatory Visit: Payer: Self-pay

## 2021-10-10 LAB — BASIC METABOLIC PANEL
Anion gap: 10 (ref 5–15)
BUN: 9 mg/dL (ref 6–20)
CO2: 23 mmol/L (ref 22–32)
Calcium: 9.1 mg/dL (ref 8.9–10.3)
Chloride: 106 mmol/L (ref 98–111)
Creatinine, Ser: 0.78 mg/dL (ref 0.44–1.00)
GFR, Estimated: >60 mL/min (ref >60)
Glucose, Bld: 127 mg/dL — ABNORMAL HIGH (ref 70–99)
Potassium: 4.5 mmol/L (ref 3.5–5.1)
Sodium: 139 mmol/L (ref 135–145)

## 2021-10-10 LAB — CBC
HCT: 41.5 % (ref 36.0–46.0)
Hemoglobin: 13.7 g/dL (ref 12.0–15.0)
MCH: 33.2 pg (ref 26.0–34.0)
MCHC: 33 g/dL (ref 30.0–36.0)
MCV: 100.5 fL — ABNORMAL HIGH (ref 80.0–100.0)
Platelets: 199 10*3/uL (ref 150–400)
RBC: 4.13 MIL/uL (ref 3.87–5.11)
RDW: 12.4 % (ref 11.5–15.5)
WBC: 6 10*3/uL (ref 4.0–10.5)
nRBC: 0 % (ref 0.0–0.2)

## 2021-10-10 MED ORDER — KCL IN DEXTROSE-NACL 20-5-0.45 MEQ/L-%-% IV SOLN
INTRAVENOUS | Status: DC
  Filled 2021-10-10 (×2): qty 1000

## 2021-10-10 NOTE — Progress Notes (Signed)
Patient currently turned red MEWS. Stechschulte, MD paged and made aware

## 2021-10-10 NOTE — Progress Notes (Signed)
Progress Note: General Surgery Service   Chief Complaint/Subjective: Complains of nausea, pain, and being too drugged up this morning.  Threw up twice this morning.  Objective: Vital signs in last 24 hours: Temp:  [97.8 F (36.6 C)-98.7 F (37.1 C)] 98 F (36.7 C) (02/15 0322) Pulse Rate:  [75-102] 102 (02/15 0322) Resp:  [11-18] 17 (02/15 0322) BP: (110-145)/(64-111) 145/111 (02/15 0322) SpO2:  [92 %-98 %] 96 % (02/15 0322) Weight:  [58.1 kg] 58.1 kg (02/14 1117)    Intake/Output from previous day: 02/14 0701 - 02/15 0700 In: 2604.1 [P.O.:220; I.V.:2000; IV Piggyback:384.1] Out: 675 [Urine:325; Blood:350] Intake/Output this shift: No intake/output data recorded.  GI: Abd incision c/d/I w/ dressing, penrose in place  Lab Results: CBC  Recent Labs    10/09/21 2048 10/10/21 0258  WBC 9.4 6.0  HGB 12.7 13.7  HCT 40.3 41.5  PLT 193 199   BMET Recent Labs    10/09/21 2048 10/10/21 0258  NA  --  139  K  --  4.5  CL  --  106  CO2  --  23  GLUCOSE  --  127*  BUN  --  9  CREATININE 0.74 0.78  CALCIUM  --  9.1   PT/INR No results for input(s): LABPROT, INR in the last 72 hours. ABG No results for input(s): PHART, HCO3 in the last 72 hours.  Invalid input(s): PCO2, PO2  Anti-infectives: Anti-infectives (From admission, onward)    Start     Dose/Rate Route Frequency Ordered Stop   10/09/21 2200  piperacillin-tazobactam (ZOSYN) IVPB 3.375 g        3.375 g 12.5 mL/hr over 240 Minutes Intravenous Every 8 hours 10/09/21 2018 10/13/21 2159   10/09/21 1215  ceFAZolin (ANCEF) IVPB 2g/100 mL premix        2 g 200 mL/hr over 30 Minutes Intravenous On call to O.R. 10/09/21 1109 10/09/21 1416       Medications: Scheduled Meds:  acetaminophen  650 mg Oral Q6H   amLODipine  10 mg Oral Daily   docusate sodium  100 mg Oral BID   enoxaparin (LOVENOX) injection  40 mg Subcutaneous Q24H   gabapentin  300 mg Oral TID   ketorolac  15 mg Intravenous Q8H   levothyroxine   25 mcg Oral QAC breakfast   metoCLOPramide (REGLAN) injection  10 mg Intravenous Q6H   pantoprazole  40 mg Oral Daily   vortioxetine HBr  10 mg Oral Daily   Continuous Infusions:  lactated ringers 75 mL/hr at 10/10/21 0320   methocarbamol (ROBAXIN) IV     piperacillin-tazobactam (ZOSYN)  IV 3.375 g (10/10/21 0751)   PRN Meds:.bisacodyl, EPINEPHrine, HYDROmorphone (DILAUDID) injection, methocarbamol (ROBAXIN) IV, ondansetron (ZOFRAN) IV, oxyCODONE, oxyCODONE, phenol, polyethylene glycol, prochlorperazine, simethicone  Assessment/Plan: Ms. Stoney is a 56 year old female s/p open small bowel resection, primary closure of epigastric hernia and 1 hour lysis of adhesions on 10/09/21.   Pain, nausea and feels too drugged up today Continue NPO with sips of liquids Switch to maintenance IVF Increase activity Discontinue home PO morphine dosing and rely on PRN medications to help with feeling drugged up Nausea medication as needed, may need NG tube if nausea doesn't subside     LOS: 1 day   FEN: mIVF ID: Zosyn x 4 days due to intra-operative contamination VTE: SCDs, lovenox Foley: Removed Dispo: Continued care on floor till return of bowel function    Felicie Morn, MD  [facility] Use [email or web address]  to contact on call provider

## 2021-10-10 NOTE — Progress Notes (Signed)
Patient refused Gabapentin today

## 2021-10-11 LAB — BASIC METABOLIC PANEL
Anion gap: 15 (ref 5–15)
Anion gap: 11 (ref 5–15)
BUN: 32 mg/dL — ABNORMAL HIGH (ref 6–20)
BUN: 35 mg/dL — ABNORMAL HIGH (ref 6–20)
CO2: 20 mmol/L — ABNORMAL LOW (ref 22–32)
CO2: 22 mmol/L (ref 22–32)
Calcium: 8 mg/dL — ABNORMAL LOW (ref 8.9–10.3)
Calcium: 7.8 mg/dL — ABNORMAL LOW (ref 8.9–10.3)
Chloride: 100 mmol/L (ref 98–111)
Chloride: 103 mmol/L (ref 98–111)
Creatinine, Ser: 3.31 mg/dL — ABNORMAL HIGH (ref 0.44–1.00)
Creatinine, Ser: 3.09 mg/dL — ABNORMAL HIGH (ref 0.44–1.00)
GFR, Estimated: 16 mL/min — ABNORMAL LOW (ref >60)
GFR, Estimated: 17 mL/min — ABNORMAL LOW (ref >60)
Glucose, Bld: 88 mg/dL (ref 70–99)
Glucose, Bld: 103 mg/dL — ABNORMAL HIGH (ref 70–99)
Potassium: 4.9 mmol/L (ref 3.5–5.1)
Potassium: 4.8 mmol/L (ref 3.5–5.1)
Sodium: 135 mmol/L (ref 135–145)
Sodium: 136 mmol/L (ref 135–145)

## 2021-10-11 LAB — CBC
HCT: 39.4 % (ref 36.0–46.0)
Hemoglobin: 12.5 g/dL (ref 12.0–15.0)
MCH: 32.9 pg (ref 26.0–34.0)
MCHC: 31.7 g/dL (ref 30.0–36.0)
MCV: 103.7 fL — ABNORMAL HIGH (ref 80.0–100.0)
Platelets: 170 10*3/uL (ref 150–400)
RBC: 3.8 MIL/uL — ABNORMAL LOW (ref 3.87–5.11)
RDW: 12.5 % (ref 11.5–15.5)
WBC: 7.3 10*3/uL (ref 4.0–10.5)
nRBC: 0 % (ref 0.0–0.2)

## 2021-10-11 LAB — SURGICAL PATHOLOGY

## 2021-10-11 MED ORDER — MORPHINE SULFATE 15 MG PO TABS
15.0000 mg | ORAL_TABLET | Freq: Four times a day (QID) | ORAL | Status: DC | PRN
  Administered 2021-10-11 – 2021-10-12 (×2): 15 mg via ORAL
  Filled 2021-10-11 (×2): qty 1

## 2021-10-11 MED ORDER — SODIUM CHLORIDE 0.9 % IV BOLUS
1000.0000 mL | Freq: Once | INTRAVENOUS | Status: AC
  Administered 2021-10-11: 1000 mL via INTRAVENOUS

## 2021-10-11 MED ORDER — DEXTROSE-NACL 5-0.45 % IV SOLN: INTRAVENOUS | Status: DC

## 2021-10-11 MED ORDER — CHLORHEXIDINE GLUCONATE CLOTH 2 % EX PADS
6.0000 | MEDICATED_PAD | Freq: Every day | CUTANEOUS | Status: DC
  Administered 2021-10-11 – 2021-10-12 (×2): 6 via TOPICAL

## 2021-10-11 NOTE — Progress Notes (Signed)
Mobility Specialist: Progress Note   10/11/21 1625  Mobility  Activity Refused mobility   Pt refused mobility d/t pain level. Assisted by RN to help adjust pt in the bed. Will f/u as able.   [facility] Ferd Horrigan Mobility Specialist Mobility Specialist 5 North: [phone] Mobility Specialist 6 North: [phone]

## 2021-10-11 NOTE — Progress Notes (Signed)
Pt c/o unable to urinate all day. Bladder scan volume 106ml. Called on call Dr. Barry Dienes received order to do In and out cath and if urine output greater than 177ml leave foley in. Fr 16 Foley  catheter placed with approx 140ml  urine output.

## 2021-10-11 NOTE — Progress Notes (Addendum)
Progress Note: General Surgery Service   Chief Complaint/Subjective: Pain this morning.  Bloating.  No nausea or vomiting since yesterday morning.  Fired her nurse yesterday.  Difficult with staff.  Objective: Vital signs in last 24 hours: Temp:  [98 F (36.7 C)-101.2 F (38.4 C)] 98 F (36.7 C) (02/16 0800) Pulse Rate:  [94-120] 101 (02/16 0800) Resp:  [17-20] 18 (02/16 0800) BP: (93-147)/(62-114) 101/82 (02/16 0800) SpO2:  [92 %-95 %] 94 % (02/16 0800) Last BM Date : 10/08/21  Intake/Output from previous day: 02/15 0701 - 02/16 0700 In: 2607.9 [P.O.:150; I.V.:1649.7; IV Piggyback:808.2] Out: 300 [Urine:300] Intake/Output this shift: No intake/output data recorded.  GI: Abd incision c/d/I w/ glue, penrose drain in place  Lab Results: CBC  Recent Labs    10/10/21 0258 10/11/21 0041  WBC 6.0 7.3  HGB 13.7 12.5  HCT 41.5 39.4  PLT 199 170    BMET Recent Labs    10/10/21 0258 10/11/21 0041  NA 139 135  K 4.5 4.9  CL 106 100  CO2 23 20*  GLUCOSE 127* 88  BUN 9 32*  CREATININE 0.78 3.31*  CALCIUM 9.1 8.0*    PT/INR No results for input(s): LABPROT, INR in the last 72 hours. ABG No results for input(s): PHART, HCO3 in the last 72 hours.  Invalid input(s): PCO2, PO2  Anti-infectives: Anti-infectives (From admission, onward)    Start     Dose/Rate Route Frequency Ordered Stop   10/09/21 2200  piperacillin-tazobactam (ZOSYN) IVPB 3.375 g        3.375 g 12.5 mL/hr over 240 Minutes Intravenous Every 8 hours 10/09/21 2018 10/13/21 2159   10/09/21 1215  ceFAZolin (ANCEF) IVPB 2g/100 mL premix        2 g 200 mL/hr over 30 Minutes Intravenous On call to O.R. 10/09/21 1109 10/09/21 1416       Medications: Scheduled Meds:  acetaminophen  650 mg Oral Q6H   Chlorhexidine Gluconate Cloth  6 each Topical Daily   docusate sodium  100 mg Oral BID   gabapentin  300 mg Oral TID   levothyroxine  25 mcg Oral QAC breakfast   metoCLOPramide (REGLAN) injection  10  mg Intravenous Q6H   pantoprazole  40 mg Oral Daily   vortioxetine HBr  10 mg Oral Daily   Continuous Infusions:  dextrose 5 % and 0.45 % NaCl with KCl 20 mEq/L 75 mL/hr at 10/11/21 0355   methocarbamol (ROBAXIN) IV     piperacillin-tazobactam (ZOSYN)  IV 3.375 g (10/11/21 0500)   PRN Meds:.bisacodyl, EPINEPHrine, HYDROmorphone (DILAUDID) injection, methocarbamol (ROBAXIN) IV, ondansetron (ZOFRAN) IV, oxyCODONE, oxyCODONE, phenol, polyethylene glycol, prochlorperazine, simethicone  Assessment/Plan: Ms. Torosyan is a 56 year old female s/p open small bowel resection, primary closure of epigastric hernia and 1 hour lysis of adhesions on 10/09/21.   Pain, and bloating today.  Thought she may have a bowel movement but no results. Clear liquid diet today Restart half home morphine dosing Acute kidney injury - stop nephrotoxic medications, stop lovenox, stop amlodipine gave 1 L normal saline, foley placed for strict I/O, increase maintenance IVF rate Increase activity    LOS: 2 days   FEN: mIVF ID: Zosyn x 4 days due to intra-operative contamination VTE: SCDs, lovenox on hold due to kidney function Foley: continue for strict I/O Dispo: Continued care on floor till return of bowel function    Felicie Morn, MD  [facility] Use [email or web address] to contact on call provider

## 2021-10-11 NOTE — TOC Transition Note (Signed)
Transition of Care [facility]) - CM/SW Discharge Note   Patient Details  Name: Julie Williams MRN: [id number] Date of Birth: 1965/11/28  Transition of Care [facility]) CM/SW Contact:  Marilu Favre, RN Phone Number: 10/11/2021, 9:59 AM   Clinical Narrative:      Transition of Care (TOC) Screening Note   Patient Details  Name: Julie Williams Date of Birth: Nov 06, 1965    Transition of Care Department [facility]) has reviewed patient and no TOC needs have been identified at this time. We will continue to monitor patient advancement through interdisciplinary progression rounds. If new patient transition needs arise, please place a TOC consult.          Patient Goals and CMS Choice        Discharge Placement                       Discharge Plan and Services                                     Social Determinants of Health (SDOH) Interventions     Readmission Risk Interventions No flowsheet data found.

## 2021-10-12 LAB — BASIC METABOLIC PANEL
Anion gap: 13 (ref 5–15)
Anion gap: 17 — ABNORMAL HIGH (ref 5–15)
BUN: 43 mg/dL — ABNORMAL HIGH (ref 6–20)
BUN: 39 mg/dL — ABNORMAL HIGH (ref 6–20)
CO2: 19 mmol/L — ABNORMAL LOW (ref 22–32)
CO2: 18 mmol/L — ABNORMAL LOW (ref 22–32)
Calcium: 7.8 mg/dL — ABNORMAL LOW (ref 8.9–10.3)
Calcium: 7.9 mg/dL — ABNORMAL LOW (ref 8.9–10.3)
Chloride: 100 mmol/L (ref 98–111)
Chloride: 100 mmol/L (ref 98–111)
Creatinine, Ser: 3.2 mg/dL — ABNORMAL HIGH (ref 0.44–1.00)
Creatinine, Ser: 2.72 mg/dL — ABNORMAL HIGH (ref 0.44–1.00)
GFR, Estimated: 16 mL/min — ABNORMAL LOW (ref >60)
GFR, Estimated: 20 mL/min — ABNORMAL LOW (ref >60)
Glucose, Bld: 99 mg/dL (ref 70–99)
Glucose, Bld: 89 mg/dL (ref 70–99)
Potassium: 4.2 mmol/L (ref 3.5–5.1)
Potassium: 3.7 mmol/L (ref 3.5–5.1)
Sodium: 132 mmol/L — ABNORMAL LOW (ref 135–145)
Sodium: 135 mmol/L (ref 135–145)

## 2021-10-12 LAB — CBC
HCT: 41.7 % (ref 36.0–46.0)
Hemoglobin: 13.4 g/dL (ref 12.0–15.0)
MCH: 32.5 pg (ref 26.0–34.0)
MCHC: 32.1 g/dL (ref 30.0–36.0)
MCV: 101.2 fL — ABNORMAL HIGH (ref 80.0–100.0)
Platelets: 161 10*3/uL (ref 150–400)
RBC: 4.12 MIL/uL (ref 3.87–5.11)
RDW: 12.4 % (ref 11.5–15.5)
WBC: 5.5 10*3/uL (ref 4.0–10.5)
nRBC: 0 % (ref 0.0–0.2)

## 2021-10-12 MED ORDER — MORPHINE SULFATE 15 MG PO TABS
30.0000 mg | ORAL_TABLET | Freq: Four times a day (QID) | ORAL | Status: DC | PRN
  Administered 2021-10-12 (×2): 30 mg via ORAL
  Filled 2021-10-12 (×2): qty 2

## 2021-10-12 MED ORDER — PIPERACILLIN-TAZOBACTAM IN DEX 2-0.25 GM/50ML IV SOLN
2.2500 g | Freq: Three times a day (TID) | INTRAVENOUS | Status: DC
  Administered 2021-10-12 – 2021-10-13 (×3): 2.25 g via INTRAVENOUS
  Filled 2021-10-12 (×4): qty 50

## 2021-10-12 NOTE — Care Management Important Message (Signed)
Important Message  Patient Details  Name: Julie Williams MRN: [id number] Date of Birth: [birth year]   Medicare Important Message Given:  Yes     Hannah Beat 10/12/2021, 12:19 PM

## 2021-10-12 NOTE — Progress Notes (Signed)
Mobility Specialist Progress Note:   10/12/21 1500  Mobility  Activity Ambulated with assistance in hallway  Level of Assistance Contact guard assist, steadying assist  Assistive Device None  Distance Ambulated (ft) 250 ft  Activity Response Tolerated fair  $Mobility charge 1 Mobility   Second walk of the Julie Williams per pt request. Complaints of 8/10 abdominal pain. Pt left in bed with call bell in reach and all needs met.   [facility] Public librarian Phone [phone]

## 2021-10-12 NOTE — Progress Notes (Signed)
Mobility Specialist Progress Note:   10/12/21 1114  Mobility  Activity Ambulated with assistance in hallway  Level of Assistance Standby assist, set-up cues, supervision of patient - no hands on  Assistive Device None  Distance Ambulated (ft) 570 ft  Activity Response Tolerated well  $Mobility charge 1 Mobility   Pt received in bed willing to participate in mobility. Complaints of 8/10 abdomen pain. Required 1 seated break. Left in bed with call bell in reach and all needs met.   [facility] Public librarian Phone [phone]

## 2021-10-12 NOTE — Progress Notes (Signed)
PHARMACY NOTE:  ANTIMICROBIAL RENAL DOSAGE ADJUSTMENT  Current antimicrobial regimen includes a mismatch between antimicrobial dosage and estimated renal function.  As per policy approved by the Pharmacy & Therapeutics and Medical Executive Committees, the antimicrobial dosage will be adjusted accordingly.  Current antimicrobial dosage:  Zosyn 3.375 g IV q8h to be infused over 4 hours  Indication: Intra-abdominal contamination, source controlled at time of surgery  Renal Function:  Estimated Creatinine Clearance: 17.8 mL/min (A) (by C-G formula based on SCr of 3.2 mg/dL (H)). []      On intermittent HD, scheduled: []      On CRRT    Antimicrobial dosage has been changed to:  Zosyn 2.25 g IV q8h x 4 more doses  Additional comments: n/a  Thank you for involving pharmacy in this patient's care.  Renold Genta, PharmD, BCPS Clinical Pharmacist Clinical phone for 10/12/2021 until 3p is [phone] 10/12/2021 10:40 AM  **Pharmacist phone directory can be found on [email or web address] listed under [place]**

## 2021-10-12 NOTE — Discharge Summary (Signed)
Patient ID: Julie Williams [id number] y.o. 12-Aug-1966  10/09/2021  Discharge date and time: 10/12/2021  Admitting Physician: [place]  Discharge Physician: [place]  Admission Diagnoses: Small bowel mass [K63.89] Patient Active Problem List   Diagnosis Date Noted   Small bowel mass 09/18/2021   Ganglion cyst of volar aspect of right wrist 03/23/2021   Mass of right wrist 03/06/2021   Pain of left heel 03/06/2021   Mass of right ear 03/06/2021   Trigger finger, left middle finger 10/24/2020   Muscle cramps 10/24/2020   Back pain 06/20/2020   Need for shingles vaccine 08/12/2019   Hypothyroidism 06/03/2019   Chronic pain syndrome 06/03/2019   Incisional hernia s/p open component separation & repair w absorbable Phasix mesh 06/02/2019 06/03/2019   Diverticulosis 03/2019   Hair loss 01/06/2019   Anemia due to chronic illness 10/01/2018   Rectal perforation s/p partial colectomy/colostomy Jan 2020 08/27/2018   Hx SBO 07/2018   Impingement syndrome of right shoulder region 11/10/2017   Vaginal atrophy 07/05/2016   Acromioclavicular joint arthritis 12/25/2015   Cervical spine degeneration 06/19/2015   Full thickness rotator cuff tear 01/06/2015   GERD (gastroesophageal reflux disease) 11/07/2014   Benign essential HTN 09/13/2011   History of colonic polyps 09/13/2011   Tobacco use disorder 07/10/2011   Long term (current) use of opiate analgesic 06/04/2011   Chronic neck pain 06/04/2011   Depression 07/17/2009   Rotator cuff syndrome of right shoulder 06/29/2009     Discharge Diagnoses: Small bowel perforation Patient Active Problem List   Diagnosis Date Noted   Small bowel mass 09/18/2021   Ganglion cyst of volar aspect of right wrist 03/23/2021   Mass of right wrist 03/06/2021   Pain of left heel 03/06/2021   Mass of right ear 03/06/2021   Trigger finger, left middle finger 10/24/2020   Muscle cramps 10/24/2020   Back pain 06/20/2020    Need for shingles vaccine 08/12/2019   Hypothyroidism 06/03/2019   Chronic pain syndrome 06/03/2019   Incisional hernia s/p open component separation & repair w absorbable Phasix mesh 06/02/2019 06/03/2019   Diverticulosis 03/2019   Hair loss 01/06/2019   Anemia due to chronic illness 10/01/2018   Rectal perforation s/p partial colectomy/colostomy Jan 2020 08/27/2018   Hx SBO 07/2018   Impingement syndrome of right shoulder region 11/10/2017   Vaginal atrophy 07/05/2016   Acromioclavicular joint arthritis 12/25/2015   Cervical spine degeneration 06/19/2015   Full thickness rotator cuff tear 01/06/2015   GERD (gastroesophageal reflux disease) 11/07/2014   Benign essential HTN 09/13/2011   History of colonic polyps 09/13/2011   Tobacco use disorder 07/10/2011   Long term (current) use of opiate analgesic 06/04/2011   Chronic neck pain 06/04/2011   Depression 07/17/2009   Rotator cuff syndrome of right shoulder 06/29/2009    Operations: Procedure(s): OPEN EXCISION OF SMALL BOWEL MASS PRIMARY CLOSURE OF EPIGASTRSIC INCISIONAL HERNIA LYSIS OF ADHESION x 1 hour  Admission Condition: good  Discharged Condition: good  Indication for Admission: Small bowel lesion  Hospital Course: Ms. Jarnagin was found to have a small bowel lesion on imaging and endoscopy concerning for a GIST tumor.  She underwent exploratory laparotomy with small bowel resection at the ligament of treitz and was found on final pathology not to have a mass but a perforation of the small intestine contained to the wall of the jejunum and the mesentery.  She recovered in the hospital and was discharged.  She developed acute kidney  injury during her recovery likely related to fluid shifts at the time of surgery, NSAIDs and home amlodipine.  Urine output and creatinine improved prior to discharge.  Consults: None  Significant Diagnostic Studies: None  Treatments: surgery: as above  Disposition: Home  Patient  Instructions:  Allergies as of 10/12/2021       Reactions   Bee Venom Swelling        Medication List     TAKE these medications    amLODipine 10 MG tablet Commonly known as: NORVASC Take 10 mg by mouth daily.   bisacodyl 5 MG EC tablet Commonly known as: DULCOLAX Take 5 mg by mouth daily as needed for moderate constipation.   EPINEPHrine 0.3 mg/0.3 mL Soaj injection Commonly known as: EPI-PEN Inject 0.3 mg into the muscle as needed for anaphylaxis.   HAIR/SKIN/NAILS PO Take 1 tablet by mouth daily.   levothyroxine 25 MCG tablet Commonly known as: SYNTHROID Take 1 tablet (25 mcg total) by mouth daily before breakfast.   morphine 30 MG tablet Commonly known as: MSIR Take 1 tablet (30 mg total) by mouth every 6 (six) hours as needed for severe pain.   omeprazole 40 MG capsule Commonly known as: PRILOSEC TAKE 1 CAPSULE BY MOUTH EVERY DAY   polyethylene glycol 17 g packet Commonly known as: MIRALAX / GLYCOLAX Take 17 g by mouth daily. What changed:  when to take this reasons to take this   Vitamin D3 250 MCG (10000 UT) Tabs Take 10,000 mcg by mouth daily.   vortioxetine HBr 10 MG Tabs tablet Commonly known as: Trintellix Take 1 tablet (10 mg total) by mouth daily.        Activity: no heavy lifting for 4 weeks Diet:  soft diet Wound Care: keep wound clean and dry  Follow-up:  With Dr. Thermon Leyland in 4 weeks.  Signed: Nickola Major Stechschulte General, Bariatric, & Minimally Invasive Surgery [facility], [place]   10/12/2021, 5:58 PM

## 2021-10-12 NOTE — Progress Notes (Signed)
Progress Note: General Surgery Service   Chief Complaint/Subjective: Bowel movements yesterday.  Threw up broth because it was salty.    Objective: Vital signs in last 24 hours: Temp:  [98 F (36.7 C)-98.9 F (37.2 C)] 98.8 F (37.1 C) (02/17 0501) Pulse Rate:  [92-103] 103 (02/17 0501) Resp:  [18-20] 20 (02/17 0501) BP: (90-115)/(62-82) 103/76 (02/17 0501) SpO2:  [94 %-96 %] 95 % (02/17 0501) Last BM Date : 10/11/21  Intake/Output from previous day: 02/16 0701 - 02/17 0700 In: 3203.2 [P.O.:700; I.V.:2307.8; IV Piggyback:195.4] Out: 950 [Urine:950] Intake/Output this shift: Total I/O In: 2723.2 [P.O.:220; I.V.:2307.8; IV Piggyback:195.4] Out: 600 [Urine:600]  GI: Abd incision c/d/I w/ glue, penrose drain in place  Lab Results: CBC  Recent Labs    10/11/21 0041 10/12/21 0346  WBC 7.3 5.5  HGB 12.5 13.4  HCT 39.4 41.7  PLT 170 161    BMET Recent Labs    10/11/21 0909 10/12/21 0346  NA 136 132*  K 4.8 4.2  CL 103 100  CO2 22 19*  GLUCOSE 103* 99  BUN 35* 43*  CREATININE 3.09* 3.20*  CALCIUM 7.8* 7.8*    PT/INR No results for input(s): LABPROT, INR in the last 72 hours. ABG No results for input(s): PHART, HCO3 in the last 72 hours.  Invalid input(s): PCO2, PO2  Anti-infectives: Anti-infectives (From admission, onward)    Start     Dose/Rate Route Frequency Ordered Stop   10/09/21 2200  piperacillin-tazobactam (ZOSYN) IVPB 3.375 g        3.375 g 12.5 mL/hr over 240 Minutes Intravenous Every 8 hours 10/09/21 2018 10/13/21 2159   10/09/21 1215  ceFAZolin (ANCEF) IVPB 2g/100 mL premix        2 g 200 mL/hr over 30 Minutes Intravenous On call to O.R. 10/09/21 1109 10/09/21 1416       Medications: Scheduled Meds:  acetaminophen  650 mg Oral Q6H   Chlorhexidine Gluconate Cloth  6 each Topical Daily   docusate sodium  100 mg Oral BID   levothyroxine  25 mcg Oral QAC breakfast   metoCLOPramide (REGLAN) injection  10 mg Intravenous Q6H    pantoprazole  40 mg Oral Daily   vortioxetine HBr  10 mg Oral Daily   Continuous Infusions:  dextrose 5 % and 0.45% NaCl 125 mL/hr at 10/11/21 5427   methocarbamol (ROBAXIN) IV 500 mg (10/11/21 2124)   piperacillin-tazobactam (ZOSYN)  IV 3.375 g (10/12/21 0508)   PRN Meds:.bisacodyl, EPINEPHrine, HYDROmorphone (DILAUDID) injection, methocarbamol (ROBAXIN) IV, morphine, ondansetron (ZOFRAN) IV, phenol, polyethylene glycol, prochlorperazine, simethicone  Assessment/Plan: Ms. Hallisey is a 56 year old female s/p open small bowel resection, primary closure of epigastric hernia and 1 hour lysis of adhesions on 10/09/21.    Advance diet today, soft diet at discharged due to proximal anastomosis Restart full home morphine dosing, oxycodone does not work for her.  [facility] contract with [facility] - she has morphine at home Acute kidney injury - stopped nephrotoxic medications, stop lovenox, stop amlodipine, foley placed for strict I/O, maintenance IVF  UOP improving, recheck BMP this afternoon and if Cr improves, remove foley this evening Increase activity    LOS: 3 days   FEN: mIVF, adat ID: Zosyn x 4 days due to intra-operative contamination VTE: SCDs, lovenox on hold due to kidney function Foley: continue for strict I/O, possibly out this afternoon Dispo: Plan for discharge tomorrow morning    Julie Morn, MD  [facility] Use [email or web address] to contact  on call provider

## 2021-10-12 NOTE — Discharge Instructions (Signed)
SMALL BOWEL RESECTION POST OPERATIVE INSTRUCTIONS  Thinking Clearly  The anesthesia may cause you to feel different for 1 or 2 days. Do not drive, drink alcohol, or make any big decisions for at least 2 days.  Nutrition When you wake up, you will be able to drink small amounts of liquid. If you do not feel sick, you can slowly advance your diet to regular foods. Continue to drink lots of fluids, usually about 8 to 10 glasses per day. Eat a high-fiber diet so you dont strain during bowel movements. High-Fiber Foods Foods high in fiber include beans, bran cereals and whole-grain breads, peas, dried fruit (figs, apricots, and dates), raspberries, blackberries, strawberries, sweet corn, broccoli, baked potatoes with skin, plums, pears, apples, greens, and nuts. Activity Slowly increase your activity. Be sure to get up and walk every hour or so to prevent blood clots. No heavy lifting or strenuous activity for 4 weeks following surgery to prevent hernias at your incision sites It is normal to feel tired. You may need more sleep than usual.  Get your rest but make sure to get up and move around frequently to prevent blood clots and pneumonia.  Work and Return to [employer] can go back to work when you feel well enough. Discuss the timing with your surgeon. You can usually go back to school or work 1 week or less after an laparoscopic surgery and two weeks after an open surgery. If your work requires heavy lifting or strenuous activity you need to be placed on light duty for 4 weeks following surgery. You can return to gym class, sports or other physical activities 4 weeks after surgery.  Wound Care Always wash your hands before and after touching near your incision site. Do not soak in a bathtub until cleared at your follow up appointment. You may take a shower 24 hours after surgery. A small amount of drainage from the incision is normal. If the drainage is thick and yellow or the site is  red, you may have an infection, so call your surgeon. If you have a drain in one of your incisions, it will be taken out in office when the drainage stops. Steri-Strips will fall off in 7 to 10 days or they will be removed during your first office visit. If you have dermabond glue covering over the incision, allow the glue to flake off on its own. Avoid wearing tight or rough clothing. It may rub your incisions and make it harder for them to heal. Protect the new skin, especially from the sun. The sun can burn and cause darker scarring. Your scar will heal in about 4 to 6 weeks and will become softer and continue to fade over the next year.  The cosmetic appearance of the incisions will improve over the course of the first year after surgery. Sensation around your incision will return in a few weeks or months.  Bowel Movements After intestinal surgery, you may have loose watery stools for several days. If watery diarrhea lasts longer than 3 days, contact your surgeon. Pain medication (narcotics) can cause constipation. Increase the fiber in your diet with high-fiber foods if you are constipated. You can take an over the counter stool softener like Colace to avoid constipation.  Additional over the counter medications can also be used if Colace isn't sufficient (for example, Milk of Magnesia or Miralax).  Pain The amount of pain is different for each person. Some people need only 1 to 3 doses of  pain control medication, while others need more. Take alternating doses of tylenol and ibuprofen around the clock for the first five days following surgery.  This will provide a baseline of pain control and help with inflammation.  Take the narcotic pain medication in addition if needed for severe pain.  Contact Your Surgeon at [phone], if you have: Pain that will not go away Pain that gets worse A fever of more than 101F (38.3C) Repeated vomiting Swelling, redness, bleeding, or bad-smelling  drainage from your wound site Strong abdominal pain No bowel movement or unable to pass gas for 3 days Watery diarrhea lasting longer than 3 days  Pain Control The goal of pain control is to minimize pain, keep you moving and help you heal. Your surgical team will work with you on your pain plan. Most often a combination of therapies and medications are used to control your pain. You may also be given medication (local anesthetic) at the surgical site. This may help control your pain for several days. Extreme pain puts extra stress on your body at a time when your body needs to focus on healing. Do not wait until your pain has reached a level 10 or is unbearable before telling your doctor or nurse. It is much easier to control pain before it becomes severe. Following a laparoscopic procedure, pain is sometimes felt in the shoulder. This is due to the gas inserted into your abdomen during the procedure. Moving and walking helps to decrease the gas and the right shoulder pain.  Use the guide below for ways to manage your post-operative pain. Learn more by going to facs.org/safepaincontrol.  How Intense Is My Pain Common Therapies to Feel Better       I hardly notice my pain, and it does not interfere with my activities.  I notice my pain and it distracts me, but I can still do activities (sitting up, walking, standing).  Non-Medication Therapies  Ice (in a bag, applied over clothing at the surgical site), elevation, rest, meditation, massage, distraction (music, TV, play) walking and mild exercise Splinting the abdomen with pillows +  Non-Opioid Medications Acetaminophen (Tylenol) Non-steroidal anti-inflammatory drugs (NSAIDS) Aspirin, Ibuprofen (Motrin, Advil) Naproxen (Aleve) Take these as needed, when you feel pain. Both acetaminophen and NSAIDs help to decrease pain and swelling (inflammation).      My pain is hard to ignore and is more noticeable even when I rest.  My  pain interferes with my usual activities.  Non-Medication Therapies  +  Non-Opioid medications  Take on a regular schedule (around-the-clock) instead of as needed. (For example, Tylenol every 6 hours at 9:00 am, 3:00 pm, 9:00 pm, 3:00 am and Motrin every 6 hours at 12:00 am, 6:00 am, 12:00 pm, 6:00 pm)         I am focused on my pain, and I am not doing my daily activities.  I am groaning in pain, and I cannot sleep. I am unable to do anything.  My pain is as bad as it could be, and nothing else matters.  Non-Medication Therapies  +  Around-the-Clock Non-Opioid Medications  +  Short-acting opioids  Opioids should be used with other medications to manage severe pain. Opioids block pain and give a feeling of euphoria (feel high). Addiction, a serious side effect of opioids, is rare with short-term (a few days) use.  Examples of short-acting opioids include: Tramadol (Ultram), Hydrocodone (Norco, Vicodin), Hydromorphone (Dilaudid), Oxycodone (Oxycontin)     The above directions have been  adapted from the [employer] Surgical Patient Education Program.  Please refer to the ACS website if needed: http://[email or web address].biz/.ashx   Louanna Raw, MD [facility], [place], [place], [place], [place]  [id number] ?  [address], [place], [place]   [phone] ? [phone] ? FAX [phone] Web site: [email or web address]

## 2021-10-13 NOTE — Progress Notes (Signed)
4 Days Post-Op   Subjective/Chief Complaint: Pt without complaints  Wants to go home    Objective: Vital signs in last 24 hours: Temp:  [98.4 F (36.9 C)-100 F (37.8 C)] 100 F (37.8 C) (02/17 2030) Pulse Rate:  [76-92] 92 (02/18 0517) Resp:  [18] 18 (02/18 0517) BP: (101-120)/(74-93) 112/79 (02/18 0517) SpO2:  [91 %-94 %] 94 % (02/18 0517) Last BM Date : 10/11/21  Intake/Output from previous day: 02/17 0701 - 02/18 0700 In: 1010 [P.O.:960; IV Piggyback:50] Out: 375 [Urine:375] Intake/Output this shift: No intake/output data recorded.  Pt refused exam this am   Lab Results:  Recent Labs    10/11/21 0041 10/12/21 0346  WBC 7.3 5.5  HGB 12.5 13.4  HCT 39.4 41.7  PLT 170 161   BMET Recent Labs    10/12/21 0346 10/12/21 1506  NA 132* 135  K 4.2 3.7  CL 100 100  CO2 19* 18*  GLUCOSE 99 89  BUN 43* 39*  CREATININE 3.20* 2.72*  CALCIUM 7.8* 7.9*   PT/INR No results for input(s): LABPROT, INR in the last 72 hours. ABG No results for input(s): PHART, HCO3 in the last 72 hours.  Invalid input(s): PCO2, PO2  Studies/Results: No results found.  Anti-infectives: Anti-infectives (From admission, onward)    Start     Dose/Rate Route Frequency Ordered Stop   10/12/21 1400  piperacillin-tazobactam (ZOSYN) IVPB 2.25 g        2.25 g 100 mL/hr over 30 Minutes Intravenous Every 8 hours 10/12/21 1039 10/13/21 2159   10/09/21 2200  piperacillin-tazobactam (ZOSYN) IVPB 3.375 g        3.375 g 12.5 mL/hr over 240 Minutes Intravenous Every 8 hours 10/09/21 2018 10/12/21 0908   10/09/21 1215  ceFAZolin (ANCEF) IVPB 2g/100 mL premix        2 g 200 mL/hr over 30 Minutes Intravenous On call to O.R. 10/09/21 1109 10/09/21 1416       Assessment/Plan: s/p Procedure(s): OPEN EXCISION OF SMALL BOWEL MASS (N/A) PRIMARY CLOSURE OF EPIGASTRSIC INCISIONAL HERNIA (N/A) LYSIS OF ADHESION x 1 hour Discharge  LOS: 4 days   Cr better  Pt tolerating diet Refuses exam and  has to leave by 10   Clinically looks well, making urine and cr falling  Recommend she follow up with primary care  Bowels moving and tolerating diet Pain management per Romelle Starcher as agreed upon by primary surgeon and pt    Joyice Faster Tangy Drozdowski 10/13/2021

## 2021-10-13 NOTE — Progress Notes (Signed)
Patient removed her IV by herself and left the unit before I had a chance to remove her penrose drain. I found the patient waiting outside of the lobby and tried to redirect her back to the unit to remove the drain but she refused. MD notified and made aware.

## 2021-10-16 ENCOUNTER — Inpatient Hospital Stay (HOSPITAL_COMMUNITY): Payer: Medicare Other

## 2021-10-16 ENCOUNTER — Emergency Department (HOSPITAL_COMMUNITY): Payer: Medicare Other

## 2021-10-16 ENCOUNTER — Inpatient Hospital Stay (HOSPITAL_COMMUNITY)
Admission: EM | Admit: 2021-10-16 | Discharge: 2021-10-24 | DRG: 919 | Disposition: A | Discharge status: Home / Self Care | Payer: Medicare Other | Attending: Surgery | Admitting: Surgery

## 2021-10-16 DIAGNOSIS — K219 Gastro-esophageal reflux disease without esophagitis: Secondary | ICD-10-CM | POA: Diagnosis present

## 2021-10-16 DIAGNOSIS — Z79899 Other long term (current) drug therapy: Secondary | ICD-10-CM | POA: Diagnosis not present

## 2021-10-16 DIAGNOSIS — T8143XA Infection following a procedure, organ and space surgical site, initial encounter: Secondary | ICD-10-CM

## 2021-10-16 DIAGNOSIS — E876 Hypokalemia: Secondary | ICD-10-CM | POA: Diagnosis present

## 2021-10-16 DIAGNOSIS — N179 Acute kidney failure, unspecified: Secondary | ICD-10-CM | POA: Diagnosis present

## 2021-10-16 DIAGNOSIS — G894 Chronic pain syndrome: Secondary | ICD-10-CM | POA: Diagnosis present

## 2021-10-16 DIAGNOSIS — H9190 Unspecified hearing loss, unspecified ear: Secondary | ICD-10-CM | POA: Diagnosis present

## 2021-10-16 DIAGNOSIS — Z9049 Acquired absence of other specified parts of digestive tract: Secondary | ICD-10-CM

## 2021-10-16 DIAGNOSIS — F17210 Nicotine dependence, cigarettes, uncomplicated: Secondary | ICD-10-CM | POA: Diagnosis present

## 2021-10-16 DIAGNOSIS — Z91030 Bee allergy status: Secondary | ICD-10-CM

## 2021-10-16 DIAGNOSIS — I959 Hypotension, unspecified: Secondary | ICD-10-CM | POA: Diagnosis present

## 2021-10-16 DIAGNOSIS — I10 Essential (primary) hypertension: Secondary | ICD-10-CM | POA: Diagnosis present

## 2021-10-16 DIAGNOSIS — T8132XA Disruption of internal operation (surgical) wound, not elsewhere classified, initial encounter: Principal | ICD-10-CM | POA: Diagnosis present

## 2021-10-16 DIAGNOSIS — K651 Peritoneal abscess: Secondary | ICD-10-CM | POA: Diagnosis present

## 2021-10-16 DIAGNOSIS — E039 Hypothyroidism, unspecified: Secondary | ICD-10-CM | POA: Diagnosis present

## 2021-10-16 DIAGNOSIS — K9189 Other postprocedural complications and disorders of digestive system: Principal | ICD-10-CM | POA: Diagnosis present

## 2021-10-16 DIAGNOSIS — I4891 Unspecified atrial fibrillation: Secondary | ICD-10-CM

## 2021-10-16 DIAGNOSIS — I700 Atherosclerosis of aorta: Secondary | ICD-10-CM | POA: Diagnosis present

## 2021-10-16 DIAGNOSIS — Z20822 Contact with and (suspected) exposure to covid-19: Secondary | ICD-10-CM | POA: Diagnosis present

## 2021-10-16 DIAGNOSIS — F32A Depression, unspecified: Secondary | ICD-10-CM | POA: Diagnosis present

## 2021-10-16 DIAGNOSIS — Y832 Surgical operation with anastomosis, bypass or graft as the cause of abnormal reaction of the patient, or of later complication, without mention of misadventure at the time of the procedure: Secondary | ICD-10-CM | POA: Diagnosis present

## 2021-10-16 DIAGNOSIS — Z79890 Hormone replacement therapy (postmenopausal): Secondary | ICD-10-CM | POA: Diagnosis not present

## 2021-10-16 LAB — RESP PANEL BY RT-PCR (FLU A&B, COVID) ARPGX2
Influenza A by PCR: NEGATIVE
Influenza B by PCR: NEGATIVE
SARS Coronavirus 2 by RT PCR: NEGATIVE

## 2021-10-16 LAB — URINALYSIS, ROUTINE W REFLEX MICROSCOPIC
Bilirubin Urine: NEGATIVE
Glucose, UA: NEGATIVE mg/dL
Ketones, ur: 5 mg/dL — AB
Nitrite: NEGATIVE
Protein, ur: 30 mg/dL — AB
Specific Gravity, Urine: 1.043 — ABNORMAL HIGH (ref 1.005–1.030)
pH: 6 (ref 5.0–8.0)

## 2021-10-16 LAB — COMPREHENSIVE METABOLIC PANEL
ALT: 14 U/L (ref 0–44)
AST: 22 U/L (ref 15–41)
Albumin: 2.7 g/dL — ABNORMAL LOW (ref 3.5–5.0)
Alkaline Phosphatase: 74 U/L (ref 38–126)
Anion gap: 9 (ref 5–15)
BUN: 22 mg/dL — ABNORMAL HIGH (ref 6–20)
CO2: 24 mmol/L (ref 22–32)
Calcium: 8.6 mg/dL — ABNORMAL LOW (ref 8.9–10.3)
Chloride: 102 mmol/L (ref 98–111)
Creatinine, Ser: 1.23 mg/dL — ABNORMAL HIGH (ref 0.44–1.00)
GFR, Estimated: 51 mL/min — ABNORMAL LOW (ref >60)
Glucose, Bld: 89 mg/dL (ref 70–99)
Potassium: 3.5 mmol/L (ref 3.5–5.1)
Sodium: 135 mmol/L (ref 135–145)
Total Bilirubin: 0.8 mg/dL (ref 0.3–1.2)
Total Protein: 6.9 g/dL (ref 6.5–8.1)

## 2021-10-16 LAB — CBC WITH DIFFERENTIAL/PLATELET
Abs Immature Granulocytes: 1.16 10*3/uL — ABNORMAL HIGH (ref 0.00–0.07)
Basophils Absolute: 0 10*3/uL (ref 0.0–0.1)
Basophils Relative: 0 %
Eosinophils Absolute: 0.1 10*3/uL (ref 0.0–0.5)
Eosinophils Relative: 0 %
HCT: 34.5 % — ABNORMAL LOW (ref 36.0–46.0)
Hemoglobin: 11.5 g/dL — ABNORMAL LOW (ref 12.0–15.0)
Immature Granulocytes: 4 %
Lymphocytes Relative: 9 %
Lymphs Abs: 2.7 10*3/uL (ref 0.7–4.0)
MCH: 33.1 pg (ref 26.0–34.0)
MCHC: 33.3 g/dL (ref 30.0–36.0)
MCV: 99.4 fL (ref 80.0–100.0)
Monocytes Absolute: 1.1 10*3/uL — ABNORMAL HIGH (ref 0.1–1.0)
Monocytes Relative: 4 %
Neutro Abs: 25.3 10*3/uL — ABNORMAL HIGH (ref 1.7–7.7)
Neutrophils Relative %: 83 %
Platelets: 404 10*3/uL — ABNORMAL HIGH (ref 150–400)
RBC: 3.47 MIL/uL — ABNORMAL LOW (ref 3.87–5.11)
RDW: 13.3 % (ref 11.5–15.5)
WBC: 30.3 10*3/uL — ABNORMAL HIGH (ref 4.0–10.5)
nRBC: 0 % (ref 0.0–0.2)

## 2021-10-16 LAB — LIPASE, BLOOD: Lipase: 26 U/L (ref 11–51)

## 2021-10-16 LAB — LACTIC ACID, PLASMA: Lactic Acid, Venous: 1.3 mmol/L (ref 0.5–1.9)

## 2021-10-16 MED ORDER — SODIUM CHLORIDE 0.9 % IV BOLUS
1000.0000 mL | Freq: Once | INTRAVENOUS | Status: AC
  Administered 2021-10-16: 1000 mL via INTRAVENOUS

## 2021-10-16 MED ORDER — PIPERACILLIN-TAZOBACTAM 3.375 G IVPB 30 MIN
3.3750 g | Freq: Once | INTRAVENOUS | Status: AC
  Administered 2021-10-16: 3.375 g via INTRAVENOUS
  Filled 2021-10-16: qty 50

## 2021-10-16 MED ORDER — LIDOCAINE HCL (PF) 1 % IJ SOLN
INTRAMUSCULAR | Status: AC | PRN
  Administered 2021-10-16: 5 mL
  Administered 2021-10-16: 10 mL

## 2021-10-16 MED ORDER — PANTOPRAZOLE SODIUM 40 MG PO TBEC
40.0000 mg | DELAYED_RELEASE_TABLET | Freq: Every day | ORAL | Status: DC
  Administered 2021-10-17 – 2021-10-23 (×7): 40 mg via ORAL
  Filled 2021-10-16 (×7): qty 1

## 2021-10-16 MED ORDER — HEPARIN SODIUM (PORCINE) 5000 UNIT/ML IJ SOLN
5000.0000 [IU] | Freq: Three times a day (TID) | INTRAMUSCULAR | Status: DC
  Administered 2021-10-17 – 2021-10-18 (×4): 5000 [IU] via SUBCUTANEOUS
  Filled 2021-10-16 (×4): qty 1

## 2021-10-16 MED ORDER — DEXTROSE-NACL 5-0.9 % IV SOLN: INTRAVENOUS | Status: DC

## 2021-10-16 MED ORDER — METOCLOPRAMIDE HCL 5 MG/ML IJ SOLN
10.0000 mg | Freq: Four times a day (QID) | INTRAMUSCULAR | Status: AC
  Administered 2021-10-17 – 2021-10-21 (×19): 10 mg via INTRAVENOUS
  Filled 2021-10-16 (×18): qty 2

## 2021-10-16 MED ORDER — SIMETHICONE 80 MG PO CHEW
80.0000 mg | CHEWABLE_TABLET | Freq: Four times a day (QID) | ORAL | Status: DC | PRN
Start: 2021-10-16 — End: 2021-10-24
  Administered 2021-10-18 – 2021-10-22 (×4): 80 mg via ORAL
  Filled 2021-10-16 (×5): qty 1

## 2021-10-16 MED ORDER — METHOCARBAMOL 1000 MG/10ML IJ SOLN
500.0000 mg | Freq: Four times a day (QID) | INTRAVENOUS | Status: DC | PRN
  Administered 2021-10-17 – 2021-10-22 (×4): 500 mg via INTRAVENOUS
  Filled 2021-10-16 (×3): qty 5
  Filled 2021-10-16: qty 500
  Filled 2021-10-16 (×2): qty 5
  Filled 2021-10-16 (×3): qty 500

## 2021-10-16 MED ORDER — IOHEXOL 300 MG/ML  SOLN
100.0000 mL | Freq: Once | INTRAMUSCULAR | Status: AC | PRN
  Administered 2021-10-16: 100 mL via INTRAVENOUS

## 2021-10-16 MED ORDER — HYDROMORPHONE HCL 1 MG/ML IJ SOLN
1.0000 mg | INTRAMUSCULAR | Status: DC | PRN
  Administered 2021-10-16 – 2021-10-17 (×2): 1 mg via INTRAVENOUS
  Filled 2021-10-16 (×2): qty 1

## 2021-10-16 MED ORDER — FENTANYL CITRATE (PF) 100 MCG/2ML IJ SOLN
INTRAMUSCULAR | Status: AC
  Filled 2021-10-16: qty 2

## 2021-10-16 MED ORDER — ACETAMINOPHEN 325 MG PO TABS
650.0000 mg | ORAL_TABLET | Freq: Four times a day (QID) | ORAL | Status: DC
  Administered 2021-10-17 – 2021-10-24 (×27): 650 mg via ORAL
  Filled 2021-10-16 (×26): qty 2

## 2021-10-16 MED ORDER — MIDAZOLAM HCL 2 MG/2ML IJ SOLN
INTRAMUSCULAR | Status: AC | PRN
  Administered 2021-10-16: 1 mg via INTRAVENOUS

## 2021-10-16 MED ORDER — PROCHLORPERAZINE EDISYLATE 10 MG/2ML IJ SOLN: 10.0000 mg | INTRAMUSCULAR | Status: DC | PRN

## 2021-10-16 MED ORDER — MIDAZOLAM HCL 2 MG/2ML IJ SOLN
INTRAMUSCULAR | Status: AC
  Filled 2021-10-16: qty 4

## 2021-10-16 MED ORDER — ONDANSETRON 4 MG PO TBDP
4.0000 mg | ORAL_TABLET | Freq: Once | ORAL | Status: AC
  Administered 2021-10-16: 4 mg via ORAL
  Filled 2021-10-16: qty 1

## 2021-10-16 MED ORDER — PIPERACILLIN-TAZOBACTAM 3.375 G IVPB
3.3750 g | Freq: Three times a day (TID) | INTRAVENOUS | Status: DC
  Administered 2021-10-16 – 2021-10-20 (×11): 3.375 g via INTRAVENOUS
  Filled 2021-10-16 (×13): qty 50

## 2021-10-16 MED ORDER — LEVOTHYROXINE SODIUM 25 MCG PO TABS
25.0000 ug | ORAL_TABLET | Freq: Every day | ORAL | Status: DC
  Administered 2021-10-18 – 2021-10-24 (×7): 25 ug via ORAL
  Filled 2021-10-16 (×7): qty 1

## 2021-10-16 MED ORDER — VORTIOXETINE HBR 5 MG PO TABS
10.0000 mg | ORAL_TABLET | Freq: Every day | ORAL | Status: DC
  Administered 2021-10-17 – 2021-10-23 (×7): 10 mg via ORAL
  Filled 2021-10-16 (×8): qty 2

## 2021-10-16 MED ORDER — ONDANSETRON HCL 4 MG/2ML IJ SOLN
4.0000 mg | Freq: Four times a day (QID) | INTRAMUSCULAR | Status: DC | PRN
  Administered 2021-10-22 – 2021-10-23 (×3): 4 mg via INTRAVENOUS
  Filled 2021-10-16 (×3): qty 2

## 2021-10-16 MED ORDER — MORPHINE SULFATE 15 MG PO TABS
30.0000 mg | ORAL_TABLET | Freq: Four times a day (QID) | ORAL | Status: DC | PRN
  Administered 2021-10-17 – 2021-10-24 (×23): 30 mg via ORAL
  Filled 2021-10-16 (×24): qty 2

## 2021-10-16 MED ORDER — FENTANYL CITRATE (PF) 100 MCG/2ML IJ SOLN
INTRAMUSCULAR | Status: AC | PRN
  Administered 2021-10-16 (×2): 50 ug via INTRAVENOUS

## 2021-10-16 NOTE — ED Provider Notes (Addendum)
[facility] DEPT Provider Note   CSN: [id number] Arrival date & time: 10/16/21  1115     History  Chief Complaint  Patient presents with   Post-op Problem    Julie Williams is a 56 y.o. female.  56year old female presents with complaint of abdominal pain with distention and feeling bloated since surgery 10/09/21 to remove what was thought to be a small mass, per discharge instructions, was found to have  perforation of the small intestine contained to the wall of the jejunum and the mesentery on exploratory laparoscopy.  Patient states that her abdomen has been red, firm, distended since her surgery, states that she did have a hernia repair and thinks that maybe she is reacting to the mesh.  Patient reports having vomiting and diarrhea today which prompted her to come to the emergency room.  She denies fevers or chills, is not feeling short of breath or having chest pain.      Home Medications Prior to Admission medications   Medication Sig Start Date End Date Taking? Authorizing Provider  amLODipine (NORVASC) 10 MG tablet Take 10 mg by mouth daily.   Yes [provider]  Biotin w/ Vitamins C & E (HAIR/SKIN/NAILS PO) Take 1 tablet by mouth daily.   Yes [provider]  Cholecalciferol (VITAMIN D3) 250 MCG (10000 UT) TABS Take 10,000 mcg by mouth daily.   Yes [provider]  EPINEPHrine 0.3 mg/0.3 mL IJ SOAJ injection Inject 0.3 mg into the muscle as needed for anaphylaxis. 05/12/21  Yes Sponseller, Gypsy Balsam, PA-C  levothyroxine (SYNTHROID) 25 MCG tablet Take 1 tablet (25 mcg total) by mouth daily before breakfast. 05/08/21  Yes Aslam, Sadia, MD  morphine (MSIR) 30 MG tablet Take 1 tablet (30 mg total) by mouth every 6 (six) hours as needed for severe pain. Patient taking differently: Take 30 mg by mouth every 5 (five) hours as needed for severe pain. 04/24/21  Yes Aslam, Loralyn Freshwater, MD  naloxone (NARCAN) nasal spray 4 mg/0.1 mL 1  spray as needed (overdose). 05/22/21  Yes [provider]  omeprazole (PRILOSEC) 40 MG capsule TAKE 1 CAPSULE BY MOUTH EVERY DAY 09/28/20  Yes Katsadouros, Vasilios, MD  polyethylene glycol (MIRALAX / GLYCOLAX) 17 g packet Take 17 g by mouth daily. Patient taking differently: Take 17 g by mouth daily as needed for moderate constipation. 97/98/92  Yes Leighton Ruff, MD  vortioxetine HBr (TRINTELLIX) 10 MG TABS tablet Take 1 tablet (10 mg total) by mouth daily. 01/18/21  Yes Katsadouros, Vasilios, MD      Allergies    Bee venom    Review of Systems   Review of Systems Negative except as per HPI Physical Exam Updated Vital Signs BP (!) 82/71 (BP Location: Left Arm)    Pulse (!) 138    Temp 98.3 F (36.8 C) (Oral)    Resp 14    SpO2 96%  Physical Exam Vitals and nursing note reviewed.  Constitutional:      General: She is not in acute distress.    Appearance: She is well-developed. She is not diaphoretic.  HENT:     Head: Normocephalic and atraumatic.  Cardiovascular:     Rate and Rhythm: Normal rate and regular rhythm.     Heart sounds: Normal heart sounds.  Pulmonary:     Effort: Pulmonary effort is normal.     Breath sounds: Normal breath sounds.  Abdominal:     General: There is distension.  Tenderness: There is generalized abdominal tenderness.     Comments: Midline incision with lower dehiscence with serous drainage, erythema to left side abdomen   Musculoskeletal:     Cervical back: Neck supple.  Skin:    General: Skin is warm.     Findings: Erythema present.  Neurological:     Mental Status: She is alert and oriented to person, place, and time.     Motor: No weakness.  Psychiatric:        Behavior: Behavior normal.       ED Results / Procedures / Treatments   Labs (all labs ordered are listed, but only abnormal results are displayed) Labs Reviewed  CBC WITH DIFFERENTIAL/PLATELET - Abnormal; Notable for the following components:      Result Value    WBC 30.3 (*)    RBC 3.47 (*)    Hemoglobin 11.5 (*)    HCT 34.5 (*)    Platelets 404 (*)    Neutro Abs 25.3 (*)    Monocytes Absolute 1.1 (*)    Abs Immature Granulocytes 1.16 (*)    All other components within normal limits  COMPREHENSIVE METABOLIC PANEL - Abnormal; Notable for the following components:   BUN 22 (*)    Creatinine, Ser 1.23 (*)    Calcium 8.6 (*)    Albumin 2.7 (*)    GFR, Estimated 51 (*)    All other components within normal limits  RESP PANEL BY RT-PCR (FLU A&B, COVID) ARPGX2  CULTURE, BLOOD (ROUTINE X 2)  CULTURE, BLOOD (ROUTINE X 2)  LIPASE, BLOOD  LACTIC ACID, PLASMA  URINALYSIS, ROUTINE W REFLEX MICROSCOPIC  LACTIC ACID, PLASMA  HIV ANTIBODY (ROUTINE TESTING W REFLEX)  CBC  CREATININE, SERUM  PROTIME-INR  BASIC METABOLIC PANEL  CBC    EKG None  Radiology CT Abdomen Pelvis W Contrast  Result Date: 10/16/2021 CLINICAL DATA:  Postoperative abdominal pain, 1 week postop from removal of small-bowel tumor in hernia repair. EXAM: CT ABDOMEN AND PELVIS WITH CONTRAST TECHNIQUE: Multidetector CT imaging of the abdomen and pelvis was performed using the standard protocol following bolus administration of intravenous contrast. RADIATION DOSE REDUCTION: This exam was performed according to the departmental dose-optimization program which includes automated exposure control, adjustment of the mA and/or kV according to patient size and/or use of iterative reconstruction technique. CONTRAST:  166mL OMNIPAQUE IOHEXOL 300 MG/ML  SOLN COMPARISON:  None. FINDINGS: Lower chest: Bibasilar atelectasis.  Tiny left pleural effusion. Hepatobiliary: Hepatic steatosis.  No suspicious hepatic lesion. Pancreas: No pancreatic ductal dilation or evidence of acute inflammation. Spleen: Normal in size without focal abnormality. Adrenals/Urinary Tract: Bilateral adrenal glands appear normal. No hydronephrosis. Symmetric bilateral renal enhancement. Urinary bladder is unremarkable for degree  of distension. Stomach/Bowel: No enteric contrast was administered. Stomach is decompressed limiting evaluation. No pathologic dilation of small or large bowel. Surgical change of recent small bowel resection with anastomotic sutures in the midline abdomen. Adjacent to the anastomotic sutures is a gas and fluid collection which is difficult to measure as it inspissated along abdominal structures but measures approximately 9.9 x 4.4 x 11.1 cm on image 29/2. Anastomotic sutures are also present in the right lower quadrant. Vascular/Lymphatic: Normal caliber abdominal aorta. No pathologically enlarged abdominal or pelvic lymph nodes identified. Reproductive: Uterus and bilateral adnexa are unremarkable. Other: Pneumoperitoneum and free fluid with peritoneal thickening. Additional developing fluid collections along the small bowel mesentery on image 49/2 and another superior and anterior to this on image 38/2. Midline incision with gas  and fluid tracking along the. Musculoskeletal: No acute osseous abnormality. IMPRESSION: 1. Surgical change of recent bowel resection with anastomotic sutures in the midline abdomen and right lower quadrant. Extensive pneumoperitoneum (greater than that expected 1 week postop) with free fluid, peritoneal thickening and developing walled off fluid collections. Findings which are most consistent with anastomotic leak, peritonitis and developing abscesses. 2. Tiny left pleural effusion. 3. Hepatic steatosis. These results were called by telephone at the time of interpretation on 10/16/2021 at 2:29 pm to provider [facility] , who verbally acknowledged these results. Electronically Signed   By: Dahlia Bailiff M.D.   On: 10/16/2021 14:30    Procedures .Critical Care Performed by: Tacy Learn, PA-C Authorized by: Tacy Learn, PA-C   Critical care provider statement:    Critical care time (minutes):  30   Critical care was time spent personally by me on the following activities:   Development of treatment plan with patient or surrogate, discussions with consultants, evaluation of patient's response to treatment, examination of patient, ordering and review of laboratory studies, ordering and review of radiographic studies, ordering and performing treatments and interventions, pulse oximetry, re-evaluation of patient's condition and review of old charts    Medications Ordered in ED Medications  heparin injection 5,000 Units (5,000 Units Subcutaneous Not Given 10/16/21 1657)  dextrose 5 %-0.9 % sodium chloride infusion (has no administration in time range)  piperacillin-tazobactam (ZOSYN) IVPB 3.375 g (has no administration in time range)  HYDROmorphone (DILAUDID) injection 1 mg (has no administration in time range)  methocarbamol (ROBAXIN) 500 mg in dextrose 5 % 50 mL IVPB (has no administration in time range)  acetaminophen (TYLENOL) tablet 650 mg (has no administration in time range)  metoCLOPramide (REGLAN) injection 10 mg (has no administration in time range)  ondansetron (ZOFRAN) injection 4 mg (has no administration in time range)  prochlorperazine (COMPAZINE) injection 10 mg (has no administration in time range)  simethicone (MYLICON) chewable tablet 80 mg (has no administration in time range)  levothyroxine (SYNTHROID) tablet 25 mcg (has no administration in time range)  morphine (MSIR) tablet 30 mg (has no administration in time range)  pantoprazole (PROTONIX) EC tablet 40 mg (has no administration in time range)  vortioxetine HBr (TRINTELLIX) tablet 10 mg (has no administration in time range)  fentaNYL (SUBLIMAZE) 100 MCG/2ML injection (has no administration in time range)  midazolam (VERSED) 2 MG/2ML injection (has no administration in time range)  midazolam (VERSED) injection (1 mg Intravenous Given 10/16/21 1722)  fentaNYL (SUBLIMAZE) injection (50 mcg Intravenous Given 10/16/21 1722)  lidocaine (PF) (XYLOCAINE) 1 % injection (5 mLs  Given 10/16/21 1744)   ondansetron (ZOFRAN-ODT) disintegrating tablet 4 mg (4 mg Oral Given 10/16/21 1519)  iohexol (OMNIPAQUE) 300 MG/ML solution 100 mL (100 mLs Intravenous Contrast Given 10/16/21 1405)  sodium chloride 0.9 % bolus 1,000 mL (1,000 mLs Intravenous New Bag/Given 10/16/21 1616)  sodium chloride 0.9 % bolus 1,000 mL (1,000 mLs Intravenous New Bag/Given 10/16/21 1505)  piperacillin-tazobactam (ZOSYN) IVPB 3.375 g (0 g Intravenous Stopped 10/16/21 1552)    ED Course/ Medical Decision Making/ A&P                           Medical Decision Making Amount and/or Complexity of Data Reviewed Labs: ordered. Radiology: ordered.  Risk Prescription drug management. Decision regarding hospitalization.   This patient presents to the ED for concern of postop abdominal pain, distention, redness with vomiting, this involves an extensive  number of treatment options, and is a complaint that carries with it a high risk of complications and morbidity.  The differential diagnosis includes but not limited to postop intra-abdominal infection/abscess/perforation/SBO.   Co morbidities that complicate the patient evaluation  Hypertension, diverticulosis, prior SBO   Additional history obtained:  Additional history obtained from dc summary from hospitalization related to her recent abdominal surgery 2/14-17/23   Lab Tests:  I Ordered, and personally interpreted labs.  The pertinent results include:  CBC with WBC 30k, differential pending, CMP with improved Cr at 1.23. Lactic acid pending.    Imaging Studies ordered:  I ordered imaging studies including CT abdomen/pelvis \ I agree with the radiologist interpretation free air in abdomen   Cardiac Monitoring:  The patient was maintained on a cardiac monitor.  I personally viewed and interpreted the cardiac monitored which showed an underlying rhythm of: sinus rhythm    Medicines ordered and prescription drug management:  I ordered medication including zosyn   for intra-abdominal infection Reevaluation of the patient after these medicines showed that the patient stayed the same I have reviewed the patients home medicines and have made adjustments as needed   Critical Interventions:  Fluid bolus for BP 97/68, broad spectrum antibiotics for free air/perforated viscous.   Consultations Obtained:  I requested consultation with the general surgery service, Dr. Jeanell Sparrow spoke with Richard Miu, PA-C,  and discussed lab and imaging findings as well as pertinent plan - they recommend: general surgery to see patient.   Problem List / ED Course:  56 year old female with complaint of postop abdominal pain as above.  Found to have abdominal firmness, tenderness generalized, redness to the left side of her abdomen as pictured.  Concern for postop intra-abdominal infection.  CBC with elevated white count at 30,000, call from radiologist discussing patient's CT abdomen pelvis which is concerning for more than expected free air in the abdomen, concern for dehiscence of anastomosis. Dr. Jeanell Sparrow, ER attending, in contact with general surgery who will see the patient.  IV fluids ordered for patient's soft blood pressure with improvement.  IV antibiotics ordered with plan for admission.  Discussed results with patient who was initially hesitant to stay in the hospital but agreeable.   Reevaluation:  After the interventions noted above, I reevaluated the patient and found that they have :stayed the same   Dispostion:  After consideration of the diagnostic results and the patients response to treatment, I feel that the patent would benefit from admission to the hospital.          Final Clinical Impression(s) / ED Diagnoses Final diagnoses:  Intra-abdominal abscess [facility])    Rx / [facility] Orders ED Discharge Orders     None         Tacy Learn, PA-C 10/16/21 1654    Tacy Learn, PA-C 10/16/21 Johnnye Lana    Pattricia Boss, MD 10/18/21 [phone]

## 2021-10-16 NOTE — ED Triage Notes (Signed)
Pt arrived via POV, c/o sutures coming out while driving today. Had surgery one week ago. Thick yellow drainage from incisional area. Denies any fevers.

## 2021-10-16 NOTE — ED Provider Notes (Signed)
Julie Williams called back Care discussed  She will be in to evaluate   Julie Boss, MD 10/16/21 1452

## 2021-10-16 NOTE — Progress Notes (Signed)
A consult was received from an ED physician for Zosyn per pharmacy dosing.  The patient's profile has been reviewed for ht/wt/allergies/indication/available labs.   A one time order has been placed for Zosyn 3.375 g IV.  Further antibiotics/pharmacy consults should be ordered by admitting physician if indicated.                       Royetta Asal, PharmD, BCPS Clinical Pharmacist [place] Please utilize Amion for appropriate phone number to reach the unit pharmacist ([place]) 10/16/2021 2:56 PM
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
## 2021-10-16 NOTE — Sedation Documentation (Signed)
Septic profile NS wide open HR 140 low B/P 87/

## 2021-10-16 NOTE — ED Provider Triage Note (Addendum)
Emergency Medicine Provider Triage Evaluation Note  Julie Williams , a 56 y.o. female  was evaluated in triage.  Pt complains of abdominal pain, 1 week post op removal of a tumor from small intestine, hemia repair. Surgery done at [facility], Dr. Thermon Leyland  Review of Systems  Positive: Nausea, vomiting, diarrhea, abdominal pain, swelling Negative: Fever, SHOB  Physical Exam  BP 97/68    Pulse 90    Temp 98.3 F (36.8 C) (Oral)    Resp 18    SpO2 95%  Gen:   Awake, no distress   Resp:  Normal effort  MSK:   Moves extremities without difficulty  Other:  Redness to left side abdomen (states this way since surgery)  Medical Decision Making  Medically screening exam initiated at 12:09 PM.  Appropriate orders placed.  Julie Williams was informed that the remainder of the evaluation will be completed by another provider, this initial triage assessment does not replace that evaluation, and the importance of remaining in the ED until their evaluation is complete.      Tacy Learn, PA-C 10/16/21 1211    Tacy Learn, PA-C 10/16/21 1215

## 2021-10-16 NOTE — ED Notes (Signed)
Pt given cup of water, pt resting with eyes closed, water placed on bedside table within arm reach when pt awakens

## 2021-10-16 NOTE — ED Notes (Signed)
Patient to procedure for drain placement. Will return.

## 2021-10-16 NOTE — Sedation Documentation (Signed)
One liter of NS run in and a 2nd bag hung.

## 2021-10-16 NOTE — Procedures (Signed)
Vascular and Interventional Radiology Procedure Note  Patient: Julie Williams DOB: 08-22-1966 Medical Record Number: [id number] Note Date/Time: 10/16/21 6:43 PM   Performing Physician: Michaelle Birks, MD Assistant(s): None  Diagnosis: Recent abdominal surgery. Suspected anastomotic leak.  Procedure:  DRAINAGE CATHETER PLACEMENT of LUQ MEDIAL and LATERAL ABSCESS COLLECTIONS  Anesthesia: Conscious Sedation Complications: None Estimated Blood Loss: Minimal Specimens: Sent for Gram Stain, Aerobe Culture, and Anerobe Culture  Findings:  Successful CT-guided drainage catheter placements into; LUQ Medial, 12 F, thin bilious tinged fluid aspirated  LUQ Lateral 14 F, succus aspirated  Plan:  - Flush drain with 10 mL Normal Saline every 12 hours. - Follow up drain evaluation with contrasted CT AP in 2 week(s).  See detailed procedure note with images in PACS. The patient tolerated the procedure well without incident or complication and was returned to  ER  in stable condition.    Michaelle Birks, MD [facility]   Pager. [place]

## 2021-10-16 NOTE — ED Notes (Addendum)
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 administration in time range)  morphine (MSIR) tablet 30 mg (has no administration in time range)  pantoprazole (PROTONIX) EC tablet 40 mg (has no administration in time range)  vortioxetine HBr (TRINTELLIX) tablet 10 mg (has no administration in time range)  fentaNYL (SUBLIMAZE) 100 MCG/2ML injection (has no administration in time range)  midazolam (VERSED) 2 MG/2ML injection (has no administration in time range)  ondansetron (ZOFRAN-ODT) disintegrating tablet 4 mg (4 mg Oral Given 10/16/21 1519)  iohexol (OMNIPAQUE) 300 MG/ML solution 100 mL (100 mLs Intravenous Contrast Given 10/16/21  1405)  sodium chloride 0.9 % bolus 1,000 mL (0 mLs Intravenous Stopped 10/16/21 1717)  sodium chloride 0.9 % bolus 1,000 mL (0 mLs Intravenous Stopped 10/16/21 1610)  piperacillin-tazobactam (ZOSYN) IVPB 3.375 g (0 g Intravenous Stopped 10/16/21 1552)  midazolam (VERSED) injection (1 mg Intravenous Given 10/16/21 1722)  fentaNYL (SUBLIMAZE) injection (50 mcg Intravenous Given 10/16/21 1810)  lidocaine (PF) (XYLOCAINE) 1 % injection (5 mLs  Given 10/16/21 1744)  sodium chloride 0.9 % bolus 1,000 mL (1,000 mLs Intravenous New Bag/Given 10/16/21 1952)    Mobility walks     Focused Assessments    R Recommendations: See Admitting Provider Note  Report given to:   Additional Notes:

## 2021-10-17 ENCOUNTER — Other Ambulatory Visit: Payer: Self-pay

## 2021-10-17 ENCOUNTER — Inpatient Hospital Stay (HOSPITAL_COMMUNITY): Payer: Medicare Other

## 2021-10-17 ENCOUNTER — Encounter (HOSPITAL_COMMUNITY): Payer: Self-pay | Admitting: Surgery

## 2021-10-17 DIAGNOSIS — I4891 Unspecified atrial fibrillation: Secondary | ICD-10-CM | POA: Diagnosis not present

## 2021-10-17 DIAGNOSIS — K9189 Other postprocedural complications and disorders of digestive system: Secondary | ICD-10-CM

## 2021-10-17 LAB — ECHOCARDIOGRAM COMPLETE
AR max vel: 2.18 cm2
AV Area VTI: 1.7 cm2
AV Area mean vel: 1.93 cm2
AV Mean grad: 4 mmHg
AV Peak grad: 6.7 mmHg
Ao pk vel: 1.29 m/s
Calc EF: 59.1 %
Height: 65 in
S' Lateral: 2.3 cm
Single Plane A2C EF: 58.8 %
Single Plane A4C EF: 58 %
Weight: 2356.28 oz

## 2021-10-17 LAB — CREATININE, FLUID (PLEURAL, PERITONEAL, JP DRAINAGE)
Creat, Fluid: 0.6 mg/dL
Creat, Fluid: 1.3 mg/dL

## 2021-10-17 LAB — CBC
HCT: 30.6 % — ABNORMAL LOW (ref 36.0–46.0)
Hemoglobin: 10.1 g/dL — ABNORMAL LOW (ref 12.0–15.0)
MCH: 33 pg (ref 26.0–34.0)
MCHC: 33 g/dL (ref 30.0–36.0)
MCV: 100 fL (ref 80.0–100.0)
Platelets: 423 10*3/uL — ABNORMAL HIGH (ref 150–400)
RBC: 3.06 MIL/uL — ABNORMAL LOW (ref 3.87–5.11)
RDW: 13.4 % (ref 11.5–15.5)
WBC: 24 10*3/uL — ABNORMAL HIGH (ref 4.0–10.5)
nRBC: 0 % (ref 0.0–0.2)

## 2021-10-17 LAB — BASIC METABOLIC PANEL
Anion gap: 9 (ref 5–15)
BUN: 19 mg/dL (ref 6–20)
CO2: 21 mmol/L — ABNORMAL LOW (ref 22–32)
Calcium: 7.7 mg/dL — ABNORMAL LOW (ref 8.9–10.3)
Chloride: 103 mmol/L (ref 98–111)
Creatinine, Ser: 1.29 mg/dL — ABNORMAL HIGH (ref 0.44–1.00)
GFR, Estimated: 48 mL/min — ABNORMAL LOW (ref >60)
Glucose, Bld: 89 mg/dL (ref 70–99)
Potassium: 3.5 mmol/L (ref 3.5–5.1)
Sodium: 133 mmol/L — ABNORMAL LOW (ref 135–145)

## 2021-10-17 LAB — LACTIC ACID, PLASMA: Lactic Acid, Venous: 1.3 mmol/L (ref 0.5–1.9)

## 2021-10-17 LAB — PROTIME-INR
INR: 1.3 — ABNORMAL HIGH (ref 0.8–1.2)
Prothrombin Time: 15.9 seconds — ABNORMAL HIGH (ref 11.4–15.2)

## 2021-10-17 LAB — TSH: TSH: 5.873 u[IU]/mL — ABNORMAL HIGH (ref 0.350–4.500)

## 2021-10-17 LAB — HIV ANTIBODY (ROUTINE TESTING W REFLEX): HIV Screen 4th Generation wRfx: NONREACTIVE

## 2021-10-17 MED ORDER — AMIODARONE LOAD VIA INFUSION
150.0000 mg | Freq: Once | INTRAVENOUS | Status: AC
Start: 2021-10-17 — End: 2021-10-17
  Administered 2021-10-17: 150 mg via INTRAVENOUS
  Filled 2021-10-17: qty 83.34

## 2021-10-17 MED ORDER — METOPROLOL TARTRATE 5 MG/5ML IV SOLN
5.0000 mg | INTRAVENOUS | Status: AC | PRN
  Administered 2021-10-17 (×3): 5 mg via INTRAVENOUS
  Filled 2021-10-17 (×3): qty 5

## 2021-10-17 MED ORDER — AMIODARONE HCL IN DEXTROSE 360-4.14 MG/200ML-% IV SOLN
30.0000 mg/h | INTRAVENOUS | Status: DC
  Administered 2021-10-18 – 2021-10-19 (×4): 30 mg/h via INTRAVENOUS
  Filled 2021-10-17 (×3): qty 200

## 2021-10-17 MED ORDER — ORAL CARE MOUTH RINSE
15.0000 mL | Freq: Two times a day (BID) | OROMUCOSAL | Status: DC
  Administered 2021-10-20 – 2021-10-23 (×8): 15 mL via OROMUCOSAL

## 2021-10-17 MED ORDER — CHLORHEXIDINE GLUCONATE CLOTH 2 % EX PADS
6.0000 | MEDICATED_PAD | Freq: Every day | CUTANEOUS | Status: DC
  Administered 2021-10-17 – 2021-10-20 (×4): 6 via TOPICAL

## 2021-10-17 MED ORDER — LACTATED RINGERS IV SOLN: INTRAVENOUS | Status: DC

## 2021-10-17 MED ORDER — SODIUM CHLORIDE 0.9 % IV SOLN: INTRAVENOUS | Status: DC | PRN

## 2021-10-17 MED ORDER — HYDROMORPHONE HCL 1 MG/ML IJ SOLN
1.0000 mg | INTRAMUSCULAR | Status: DC | PRN
  Administered 2021-10-17 – 2021-10-23 (×33): 1 mg via INTRAVENOUS
  Filled 2021-10-17 (×34): qty 1

## 2021-10-17 MED ORDER — AMIODARONE HCL IN DEXTROSE 360-4.14 MG/200ML-% IV SOLN
60.0000 mg/h | INTRAVENOUS | Status: DC
  Administered 2021-10-17: 60 mg/h via INTRAVENOUS
  Filled 2021-10-17: qty 200

## 2021-10-17 NOTE — Progress Notes (Signed)
Patient got agitated and demanded on leaving the hospital verbalizing she doesn't see any improvement in regards to her condition. She was educated on reasons why it would be better for her to stay and continue to receive medical care. Patient verbalized understanding and decided to stay , she was made comfortable.

## 2021-10-17 NOTE — Consult Note (Addendum)
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 with MD   2.  History of AKI -During her recent hospitalization, creatinine peaked to 3.20, currently 1.29. - Her baseline is normal -Her BUN during her previous hospitalization was as high as 43, has normalized at 19, follow  3.  Recent open small bowel resection with primary closure of epigastric hernia and lysis of adhesions on 2/14 with anastomotic leak on imaging on 2/21 s/p 2 left-sided drains by IR -She still has drainage in the tubes, the surgeon sent for amylase lipase and creatinine analysis. -She has talked about leaving  [facility] due to concerns about caring for her chickens and her dogs.  So far, she is agreeing to stay -Management per surgery   Risk Assessment/Risk Scores:     CHA2DS2-VASc Score = 2  \This indicates a 2.2% annual risk of stroke. The patient's score is based  upon: CHF History: 0 HTN History: 1 Diabetes History: 0 Stroke History: 0 Vascular Disease History: 0 Age Score: 0 Gender Score: 1     For questions or updates, please contact [place] Please consult [email or web address] for contact info under    Signed, Rosaria Ferries, PA-C  10/17/2021 11:04 AM  Personally seen and examined. Agree with APP above with the following comments: Briefly 56 yo F with a history of HTN and tobacco use who presents with perforated colon  and perforated jejunum 8/34 now complicated by anastomotic leak.  Complicated by AF RVR Patient notes that she has never had palpitations or SOB.  She has no prior CP.  She was really hoping she was not going to need surgery as she needs to go home.  10/16/21 has sudden asymptomatic tachycardia.  Was given metoprolol X3; this has been complicated by asymptomatic hypotension.  Exam notable for rapid IRIR tachycardia tender abdomen, elderly female Still having low grade fevers.  BP 96/50 in the ICU. Labs notable for improving AKI. Personally reviewed relevant tests; EKG shows AF RVR.  Heart rates still in 140s Would recommend   - given that she has new AF and CHASVASC of 2 anticoagulation would be recommended; given that she just had surgery I suspect that is not feasible at this time; when OK for surgery will start heparin - she is unable to tolerated AV nodal agents due to hypotension; recommended IV amiodarone (ordered and started), we have reviewed the risk of chemical cardioversion - will continue to follow  Rudean Haskell, MD Cardiologist [facility]  [address], [address] [place], [place] [address] [phone]  1:27 PM

## 2021-10-17 NOTE — Progress Notes (Signed)
Received call from nurse that patient was requesting her diet to be advanced.  Her nurse has already explained very well to the patient the rationale for current plan of care but requested I speak with patient by phone to further clarify.  We discussed her current situation and rationale behind her current plan of care, and I am not going to advance her diet tonight.  Patient reiterates that she believes that she can go home and have the same care that she is getting here as far as antibiotics and wound care.  I advised her that that is not the case, given issues with A-fib, aki, sepsis, and need to monitor volume and character of output from her drains and midline wound before assessing whether her diet can be advanced.  Patient did not agree with my recommendations, I advised her to discuss with her surgeon in the morning and made it clear that my recommendation is that she remain inpatient for ongoing treatment.

## 2021-10-17 NOTE — Progress Notes (Addendum)
Progress Note: General Surgery Service   Chief Complaint/Subjective: Continued leakage from inferior aspect of wound.  Drains in place left abdomen.    Objective: Vital signs in last 24 hours: Temp:  [98.3 F (36.8 C)-100 F (37.8 C)] 99.7 F (37.6 C) (02/22 0403) Pulse Rate:  [90-142] 134 (02/22 0403) Resp:  [13-20] 20 (02/22 0403) BP: (82-120)/(53-101) 102/66 (02/22 0403) SpO2:  [91 %-98 %] 93 % (02/22 0403)    Intake/Output from previous day: 02/21 0701 - 02/22 0700 In: 471.3 [I.V.:371.3; IV Piggyback:100] Out: 100 [Drains:100] Intake/Output this shift: No intake/output data recorded.  GI: Abd inferior aspect of incision with some drainage, two IR drains in left abdomen.  One with thin yellow fluid, one darker thin brown fluid.  No bile in drains  Lab Results: CBC  Recent Labs    10/16/21 1230 10/17/21 0007  WBC 30.3* 24.0*  HGB 11.5* 10.1*  HCT 34.5* 30.6*  PLT 404* 423*   BMET Recent Labs    10/16/21 1230 10/17/21 0007  NA 135 133*  K 3.5 3.5  CL 102 103  CO2 24 21*  GLUCOSE 89 89  BUN 22* 19  CREATININE 1.23* 1.29*  CALCIUM 8.6* 7.7*   PT/INR Recent Labs    10/17/21 0007  LABPROT 15.9*  INR 1.3*   ABG No results for input(s): PHART, HCO3 in the last 72 hours.  Invalid input(s): PCO2, PO2  Anti-infectives: Anti-infectives (From admission, onward)    Start     Dose/Rate Route Frequency Ordered Stop   10/16/21 2200  piperacillin-tazobactam (ZOSYN) IVPB 3.375 g        3.375 g 12.5 mL/hr over 240 Minutes Intravenous Every 8 hours 10/16/21 1524 10/23/21 2159   10/16/21 1500  piperacillin-tazobactam (ZOSYN) IVPB 3.375 g        3.375 g 100 mL/hr over 30 Minutes Intravenous  Once 10/16/21 1455 10/16/21 1552       Medications: Scheduled Meds:  acetaminophen  650 mg Oral Q6H   heparin  5,000 Units Subcutaneous Q8H   levothyroxine  25 mcg Oral Q0600   metoCLOPramide (REGLAN) injection  10 mg Intravenous Q6H   pantoprazole  40 mg Oral Daily    vortioxetine HBr  10 mg Oral Daily   Continuous Infusions:  lactated ringers     methocarbamol (ROBAXIN) IV 500 mg (10/17/21 0043)   piperacillin-tazobactam (ZOSYN)  IV 3.375 g (10/17/21 0601)   PRN Meds:.HYDROmorphone (DILAUDID) injection, methocarbamol (ROBAXIN) IV, morphine, ondansetron (ZOFRAN) IV, prochlorperazine, simethicone  Assessment/Plan: Ms. Finstad is a 56 year old female s/p open small bowel resection, primary closure of epigastric hernia and 1 hour lysis of adhesions on 10/09/21.  She presented 2/21 with concern for an anastomotic leak on imaging and two left sided IR drains were placed.  CLD - see if drainage character changes IVF Send both drain fluids for amylase, lipase and creatinine Pain/nausea control Increase activity Dressing changes daily and as needed for drainage from inferior aspect of incision AKI - improved since recent hospital stay Patient threatened to leave AMA.  Stressed about taking care of her chickens and her dogs.  Explained I will try to work with her to get her out of the hospital as quickly and safely as possible.  If she does decide to leave AMA, she should stick to a liquid diet, keep up with JP drainage, and I will call antibiotics into her pharmacy.   LOS: 1 day   FEN: Try clear liquids today, see if drainage character changes  ID: Zosyn, f/u culture results VTE: heparin, scds, Foley: None Dispo: Continued care on stepdown    Julie Morn, MD  [facility] Use [email or web address] to contact on call provider  Daily Billing:

## 2021-10-17 NOTE — Progress Notes (Signed)
°   10/17/21 0831  Assess: MEWS Score  Temp 99.1 F (37.3 C)  BP 108/73  Pulse Rate (!) 132  Resp (!) 22  SpO2 90 %  O2 Device Room Air  Assess: MEWS Score  MEWS Temp 0  MEWS Systolic 0  MEWS Pulse 3  MEWS RR 1  MEWS LOC 0  MEWS Score 4  MEWS Score Color Red  Assess: if the MEWS score is Yellow or Red  Were vital signs taken at a resting state? Yes  Focused Assessment No change from prior assessment  Does the patient meet 2 or more of the SIRS criteria? Yes  Does the patient have a confirmed or suspected source of infection? Yes  Provider and Rapid Response Notified? Yes  Early Detection of Sepsis Score *See Row Information* High  MEWS guidelines implemented *See Row Information* Yes  Treat  MEWS Interventions Administered scheduled meds/treatments;Escalated (See documentation below)  Pain Scale 0-10  Pain Score 8  Pain Type Surgical pain  Pain Location Abdomen  Pain Orientation Lower  Pain Descriptors / Indicators Discomfort  Pain Frequency Constant  Pain Onset On-going  Patients Stated Pain Goal 3  Pain Intervention(s) RN made aware  Multiple Pain Sites No  Interventions Reposition  Take Vital Signs  Increase Vital Sign Frequency  Red: Q 1hr X 4 then Q 4hr X 4, if remains red, continue Q 4hrs  Escalate  MEWS: Escalate Red: discuss with charge nurse/RN and provider, consider discussing with RRT  Notify: Charge Nurse/RN  Name of Charge Nurse/RN Notified Tome Wilson RN  Date Charge Nurse/RN Notified 10/17/21  Time Charge Nurse/RN Notified 0830  Notify: Provider  Provider Name/Title Dr. Thermon Leyland  Date Provider Notified 10/17/21  Time Provider Notified 0915  Notification Type Page  Notification Reason Change in status  Provider response See new orders  Date of Provider Response 10/17/21  Time of Provider Response 0915  Notify: Rapid Response  Name of Rapid Response RN Notified Deborah RN  Date Rapid Response Notified 10/17/21  Time Rapid Response Notified  0915  Document  Patient Outcome Transferred/level of care increased  Assess: SIRS CRITERIA  SIRS Temperature  0  SIRS Pulse 1  SIRS Respirations  1  SIRS WBC 0  SIRS Score Sum  2     Patient turned RED MEWS due to HR, Tele showing AFib, EKG obtained to confirm. RR and CN aware.  MD paged and made aware as well, frequent VS initiated and placed on MX [phone].  Orders given to give metoprolol and to tx to SD unit.

## 2021-10-17 NOTE — Progress Notes (Signed)
°  Transition of Care (TOC) Screening Note   Patient Details  Name: Julie Williams Date of Birth: [birth year]   Transition of Care [facility]) CM/SW Contact:    Lennart Pall, LCSW Phone Number: 10/17/2021, 2:24 PM    Transition of Care Department [facility]) has reviewed patient and no TOC needs have been identified at this time. We will continue to monitor patient advancement through interdisciplinary progression rounds. If new patient transition needs arise, please place a TOC consult.

## 2021-10-17 NOTE — Progress Notes (Signed)
Pt noticed to be in new onset Afib RVR at rate in 130s to 140s, pt c/o sob and some chest discomfort. Alerted physician and charge RN. Metoprolol ordered and administered, transfer to step down initiated and cardiology consult placed by Dr.

## 2021-10-17 NOTE — Progress Notes (Signed)
Referring Physician(s): Stechschulte,P  Supervising Physician: Corrie Mckusick  Patient Status:  [facility] - In-pt  Chief Complaint:  Abdominal pain/fluid collections  Subjective: Pt still has some abd discomfort, primarily at recently placed drain sites; denies N/V; has had some sweats; T max 100.6; also with new onset afib; awaiting transfer to stepdown   Allergies: Bee venom  Medications: Prior to Admission medications   Medication Sig Start Date End Date Taking? Authorizing Provider  amLODipine (NORVASC) 10 MG tablet Take 10 mg by mouth daily.   Yes [provider]  Biotin w/ Vitamins C & E (HAIR/SKIN/NAILS PO) Take 1 tablet by mouth daily.   Yes [provider]  Cholecalciferol (VITAMIN D3) 250 MCG (10000 UT) TABS Take 10,000 mcg by mouth daily.   Yes [provider]  EPINEPHrine 0.3 mg/0.3 mL IJ SOAJ injection Inject 0.3 mg into the muscle as needed for anaphylaxis. 05/12/21  Yes Sponseller, Gypsy Balsam, PA-C  levothyroxine (SYNTHROID) 25 MCG tablet Take 1 tablet (25 mcg total) by mouth daily before breakfast. 05/08/21  Yes Aslam, Sadia, MD  morphine (MSIR) 30 MG tablet Take 1 tablet (30 mg total) by mouth every 6 (six) hours as needed for severe pain. Patient taking differently: Take 30 mg by mouth every 5 (five) hours as needed for severe pain. 04/24/21  Yes Aslam, Loralyn Freshwater, MD  naloxone (NARCAN) nasal spray 4 mg/0.1 mL 1 spray as needed (overdose). 05/22/21  Yes [provider]  omeprazole (PRILOSEC) 40 MG capsule TAKE 1 CAPSULE BY MOUTH EVERY DAY 09/28/20  Yes Katsadouros, Vasilios, MD  polyethylene glycol (MIRALAX / GLYCOLAX) 17 g packet Take 17 g by mouth daily. Patient taking differently: Take 17 g by mouth daily as needed for moderate constipation. 41/96/22  Yes Leighton Ruff, MD  vortioxetine HBr (TRINTELLIX) 10 MG TABS tablet Take 1 tablet (10 mg total) by mouth daily. 01/18/21  Yes Katsadouros, Vasilios, MD     Vital Signs: BP 101/74     Pulse (!) 140    Temp 98.4 F (36.9 C) (Oral)    Resp 18    SpO2 98%   Physical Exam awake/alert; left abd drains intact, insertion sites ok, OP 10 cc from left medial drain, 90 cc from left lateral drain, fluid appears somewhat bilious; both drains flushed with minimal return  Imaging: CT Abdomen Pelvis W Contrast  Result Date: 10/16/2021 CLINICAL DATA:  Postoperative abdominal pain, 1 week postop from removal of small-bowel tumor in hernia repair. EXAM: CT ABDOMEN AND PELVIS WITH CONTRAST TECHNIQUE: Multidetector CT imaging of the abdomen and pelvis was performed using the standard protocol following bolus administration of intravenous contrast. RADIATION DOSE REDUCTION: This exam was performed according to the departmental dose-optimization program which includes automated exposure control, adjustment of the mA and/or kV according to patient size and/or use of iterative reconstruction technique. CONTRAST:  160mL OMNIPAQUE IOHEXOL 300 MG/ML  SOLN COMPARISON:  None. FINDINGS: Lower chest: Bibasilar atelectasis.  Tiny left pleural effusion. Hepatobiliary: Hepatic steatosis.  No suspicious hepatic lesion. Pancreas: No pancreatic ductal dilation or evidence of acute inflammation. Spleen: Normal in size without focal abnormality. Adrenals/Urinary Tract: Bilateral adrenal glands appear normal. No hydronephrosis. Symmetric bilateral renal enhancement. Urinary bladder is unremarkable for degree of distension. Stomach/Bowel: No enteric contrast was administered. Stomach is decompressed limiting evaluation. No pathologic dilation of small or large bowel. Surgical change of recent small bowel resection with anastomotic sutures in the midline abdomen. Adjacent to the anastomotic sutures is a gas and fluid collection which  is difficult to measure as it inspissated along abdominal structures but measures approximately 9.9 x 4.4 x 11.1 cm on image 29/2. Anastomotic sutures are also present in the right lower quadrant.  Vascular/Lymphatic: Normal caliber abdominal aorta. No pathologically enlarged abdominal or pelvic lymph nodes identified. Reproductive: Uterus and bilateral adnexa are unremarkable. Other: Pneumoperitoneum and free fluid with peritoneal thickening. Additional developing fluid collections along the small bowel mesentery on image 49/2 and another superior and anterior to this on image 38/2. Midline incision with gas and fluid tracking along the. Musculoskeletal: No acute osseous abnormality. IMPRESSION: 1. Surgical change of recent bowel resection with anastomotic sutures in the midline abdomen and right lower quadrant. Extensive pneumoperitoneum (greater than that expected 1 week postop) with free fluid, peritoneal thickening and developing walled off fluid collections. Findings which are most consistent with anastomotic leak, peritonitis and developing abscesses. 2. Tiny left pleural effusion. 3. Hepatic steatosis. These results were called by telephone at the time of interpretation on 10/16/2021 at 2:29 pm to provider [facility] , who verbally acknowledged these results. Electronically Signed   By: Dahlia Bailiff M.D.   On: 10/16/2021 14:30   DG CHEST PORT 1 VIEW  Result Date: 10/17/2021 CLINICAL DATA:  Atrial fibrillation. Postop anastomotic leak for gastrointestinal surgery. EXAM: PORTABLE CHEST 1 VIEW COMPARISON:  10/16/2018 and chest CT of 10/16/2018 FINDINGS: Cervical spine fixation. Midline trachea. Normal heart size. No pleural effusion or pneumothorax. Linear bibasilar airspace disease is decreased compared to the prior exam. Increased density projecting over the left apex. IMPRESSION: Mild bibasilar atelectasis. Increased density projecting over the left apex. This is at the site of pleuroparenchymal scarring back on 10/16/2018. Possibly secondary to osseous summation. Recommend nonemergent PA and lateral radiographs to exclude lung mass. These results will be called to the ordering clinician or  representative by the Radiologist Assistant, and communication documented in the PACS or [employer]. Electronically Signed   By: Abigail Miyamoto M.D.   On: 10/17/2021 10:56   CT IMAGE GUIDED DRAINAGE BY PERCUTANEOUS CATHETER  Result Date: 10/16/2021 INDICATION: Recent abdominal surgery.  Intra-abdominal abscesses. EXAM: CT IMAGE GUIDED DRAINAGE BY PERCUTANEOUS CATHETER X2 RADIATION DOSE REDUCTION: This exam was performed according to the departmental dose-optimization program which includes automated exposure control, adjustment of the mA and/or kV according to patient size and/or use of iterative reconstruction technique. COMPARISON:  CT AP, 10/16/2021 MEDICATIONS: The patient is currently admitted to the hospital and receiving intravenous antibiotics. The antibiotics were administered within an appropriate time frame prior to the initiation of the procedure. ANESTHESIA/SEDATION: Moderate (conscious) sedation was employed during this procedure. A total of Versed 1 mg and Fentanyl 100 mcg was administered intravenously. Moderate Sedation Time: 60 minutes. The patient's level of consciousness and vital signs were monitored continuously by radiology nursing throughout the procedure under my direct supervision. CONTRAST:  None COMPLICATIONS: None immediate. PROCEDURE: Informed written consent was obtained from the patient and/or patient's representative after a discussion of the risks, benefits and alternatives to treatment. The patient was placed supine on the CT gantry and a pre procedural CT was performed re-demonstrating the known abscess/fluid collection within the LEFT upper quadrant and midline. The procedure was planned. A timeout was performed prior to the initiation of the procedure. The LEFT upper quadrant and midline was prepped and draped in the usual sterile fashion. The overlying soft tissues were anesthetized with 1% lidocaine with epinephrine. Appropriate trajectory was planned with the use of a  22 gauge spinal needle. An 18 gauge  trocar needle was advanced into the abscess/fluid collection at the medial LEFT upper quadrant and a short Amplatz super stiff wire was coiled within the collection. Appropriate positioning was confirmed with a limited CT scan. The tract was serially dilated allowing placement of a 12 Fr drainage catheter. Appropriate positioning was confirmed with a limited postprocedural CT scan. The procedure was repeated for the lateral LEFT upper quadrant air-containing collection. A 14 Fr drainage catheter was placed. The tubes were connected to a bulb suction and sutured in place. A dressing was placed. The patient tolerated the procedure without immediate post procedural complication. IMPRESSION: 1. Successful CT guided placement of a 12 Fr medial LEFT upper quadrant and 14 Fr lateral LEFT upper quadrant drainage catheters. 2. The medial LEFT upper quadrant drain aspirated a thin bilious tinged fluid, while the lateral LEFT upper quadrant drain aspirated thick succus. Samples were sent to the laboratory as requested by the ordering clinical team. Michaelle Birks, MD [facility] Electronically Signed   By: Michaelle Birks M.D.   On: 10/16/2021 20:29   CT IMAGE GUIDED DRAINAGE BY PERCUTANEOUS CATHETER  Result Date: 10/16/2021 INDICATION: Recent abdominal surgery.  Intra-abdominal abscesses. EXAM: CT IMAGE GUIDED DRAINAGE BY PERCUTANEOUS CATHETER X2 RADIATION DOSE REDUCTION: This exam was performed according to the departmental dose-optimization program which includes automated exposure control, adjustment of the mA and/or kV according to patient size and/or use of iterative reconstruction technique. COMPARISON:  CT AP, 10/16/2021 MEDICATIONS: The patient is currently admitted to the hospital and receiving intravenous antibiotics. The antibiotics were administered within an appropriate time frame prior to the initiation of the procedure.  ANESTHESIA/SEDATION: Moderate (conscious) sedation was employed during this procedure. A total of Versed 1 mg and Fentanyl 100 mcg was administered intravenously. Moderate Sedation Time: 60 minutes. The patient's level of consciousness and vital signs were monitored continuously by radiology nursing throughout the procedure under my direct supervision. CONTRAST:  None COMPLICATIONS: None immediate. PROCEDURE: Informed written consent was obtained from the patient and/or patient's representative after a discussion of the risks, benefits and alternatives to treatment. The patient was placed supine on the CT gantry and a pre procedural CT was performed re-demonstrating the known abscess/fluid collection within the LEFT upper quadrant and midline. The procedure was planned. A timeout was performed prior to the initiation of the procedure. The LEFT upper quadrant and midline was prepped and draped in the usual sterile fashion. The overlying soft tissues were anesthetized with 1% lidocaine with epinephrine. Appropriate trajectory was planned with the use of a 22 gauge spinal needle. An 18 gauge trocar needle was advanced into the abscess/fluid collection at the medial LEFT upper quadrant and a short Amplatz super stiff wire was coiled within the collection. Appropriate positioning was confirmed with a limited CT scan. The tract was serially dilated allowing placement of a 12 Fr drainage catheter. Appropriate positioning was confirmed with a limited postprocedural CT scan. The procedure was repeated for the lateral LEFT upper quadrant air-containing collection. A 14 Fr drainage catheter was placed. The tubes were connected to a bulb suction and sutured in place. A dressing was placed. The patient tolerated the procedure without immediate post procedural complication. IMPRESSION: 1. Successful CT guided placement of a 12 Fr medial LEFT upper quadrant and 14 Fr lateral LEFT upper quadrant drainage catheters. 2. The medial  LEFT upper quadrant drain aspirated a thin bilious tinged fluid, while the lateral LEFT upper quadrant drain aspirated thick succus. Samples were sent to  the laboratory as requested by the ordering clinical team. Michaelle Birks, MD [facility] Electronically Signed   By: Michaelle Birks M.D.   On: 10/16/2021 20:29    Labs:  CBC: Recent Labs    10/11/21 0041 10/12/21 0346 10/16/21 1230 10/17/21 0007  WBC 7.3 5.5 30.3* 24.0*  HGB 12.5 13.4 11.5* 10.1*  HCT 39.4 41.7 34.5* 30.6*  PLT 170 161 404* 423*    COAGS: Recent Labs    10/17/21 0007  INR 1.3*    BMP: Recent Labs    10/12/21 0346 10/12/21 1506 10/16/21 1230 10/17/21 0007  NA 132* 135 135 133*  K 4.2 3.7 3.5 3.5  CL 100 100 102 103  CO2 19* 18* 24 21*  GLUCOSE 99 89 89 89  BUN 43* 39* 22* 19  CALCIUM 7.8* 7.9* 8.6* 7.7*  CREATININE 3.20* 2.72* 1.23* 1.29*  GFRNONAA 16* 20* 51* 48*    LIVER FUNCTION TESTS: Recent Labs    10/16/21 1230  BILITOT 0.8  AST 22  ALT 14  ALKPHOS 74  PROT 6.9  ALBUMIN 2.7*    Assessment and Plan: Pt s/p exploratory laparotomy, lysis of adhesions, excision of small bowel mass, and primary closure of epigastric incisional hernia 10/09/2021 .  Pathology showed no evidence of malignancy. Now with post op abd fluid collections, s/p left abd drain placements (12 and 14 fr to JP bulb) on 2/21(?anast leak); Tmax 100.6; WBC 24(30), hgb 10.1(11.5), creat 1.29(1.23), fluid creat 0.6, amylase/lipase pend; fluid cx pend; pt for transfer to stepdown; cont drain irrigation, close output monitoring; obtain f/u CT if clinical status worsens or WBC increases; other plans as per CCS/cards   Electronically Signed: D. Rowe Robert, PA-C 10/17/2021, 12:49 PM   I spent a total of 15 Minutes at the the patient's bedside AND on the patient's hospital floor or unit, greater than 50% of which was counseling/coordinating care for abdominal fluid  collection drains    Patient ID: Julie Williams, female   DOB: [birth year], 56 y.o.   MRN: [id number]

## 2021-10-17 NOTE — Progress Notes (Signed)
Educated patient on IV amiodarone and zosyn compatibility issues and the fact that she needed an additional IV access to infuse both medications. Educated patient on importance of both medications and their purpose related to her condition. Patient continues to address staff in an aggressive manner and states that we are lying and just do whatever we want. This RN attempted to place an IV but immediately upon insertion patient stated "take it out" and did not let RN finish insertion. Cardiologist notified of current situation and asked if it was possible to change IV antibiotic to the 30 minute infusion instead of the 4 hour, since patient is refusing another IV stick. Cardiologist stated that the goal for this patient was to IV load her within the next 48 hours but that if patient is refusing another IV, antibiotics take priority. Notified patient that we will infuse her antibiotics since she refused another IV stick, patient proceed to state that "I told you I was not going to discuss this anymore." IV antibiotics hung.

## 2021-10-17 NOTE — Progress Notes (Signed)
Patient expressed frustration with current diet order. Patient attempted to get in touch with Dr. Thermon Leyland on her own by calling his office. Charge RN assisted patient in contacting Dr. Kae Heller (surgery on call). Dr. Kae Heller provided extensive education on current disease process and the plan that Dr. Thermon Leyland had in place. Patient stated that she's "leaving tomorrow."  Patient verbalized understand but remains very agitated.

## 2021-10-17 NOTE — Progress Notes (Signed)
° °  Echocardiogram 2D Echocardiogram has been performed.  Beryle Beams 10/17/2021, 2:21 PM

## 2021-10-18 ENCOUNTER — Encounter (HOSPITAL_COMMUNITY): Payer: Self-pay | Admitting: Surgery

## 2021-10-18 LAB — BASIC METABOLIC PANEL
Anion gap: 8 (ref 5–15)
BUN: 16 mg/dL (ref 6–20)
CO2: 20 mmol/L — ABNORMAL LOW (ref 22–32)
Calcium: 7.8 mg/dL — ABNORMAL LOW (ref 8.9–10.3)
Chloride: 107 mmol/L (ref 98–111)
Creatinine, Ser: 1.2 mg/dL — ABNORMAL HIGH (ref 0.44–1.00)
GFR, Estimated: 53 mL/min — ABNORMAL LOW (ref >60)
Glucose, Bld: 110 mg/dL — ABNORMAL HIGH (ref 70–99)
Potassium: 2.9 mmol/L — ABNORMAL LOW (ref 3.5–5.1)
Sodium: 135 mmol/L (ref 135–145)

## 2021-10-18 LAB — CBC
HCT: 28.8 % — ABNORMAL LOW (ref 36.0–46.0)
HCT: 29.7 % — ABNORMAL LOW (ref 36.0–46.0)
Hemoglobin: 9.4 g/dL — ABNORMAL LOW (ref 12.0–15.0)
Hemoglobin: 9.7 g/dL — ABNORMAL LOW (ref 12.0–15.0)
MCH: 32.5 pg (ref 26.0–34.0)
MCH: 32.6 pg (ref 26.0–34.0)
MCHC: 32.6 g/dL (ref 30.0–36.0)
MCHC: 32.7 g/dL (ref 30.0–36.0)
MCV: 99.7 fL (ref 80.0–100.0)
MCV: 99.7 fL (ref 80.0–100.0)
Platelets: 398 10*3/uL (ref 150–400)
Platelets: 403 10*3/uL — ABNORMAL HIGH (ref 150–400)
RBC: 2.89 MIL/uL — ABNORMAL LOW (ref 3.87–5.11)
RBC: 2.98 MIL/uL — ABNORMAL LOW (ref 3.87–5.11)
RDW: 13.6 % (ref 11.5–15.5)
RDW: 13.8 % (ref 11.5–15.5)
WBC: 14 10*3/uL — ABNORMAL HIGH (ref 4.0–10.5)
WBC: 16 10*3/uL — ABNORMAL HIGH (ref 4.0–10.5)
nRBC: 0 % (ref 0.0–0.2)
nRBC: 0 % (ref 0.0–0.2)

## 2021-10-18 LAB — AMYLASE, BODY FLUID (OTHER)
Amylase, Body Fluid: 954 U/L
Amylase, Body Fluid: >75000 U/L

## 2021-10-18 LAB — MAGNESIUM: Magnesium: 1.6 mg/dL — ABNORMAL LOW (ref 1.7–2.4)

## 2021-10-18 LAB — PREALBUMIN: Prealbumin: <5 mg/dL — ABNORMAL LOW (ref 18–38)

## 2021-10-18 LAB — HEPARIN LEVEL (UNFRACTIONATED): Heparin Unfractionated: <0.1 IU/mL — ABNORMAL LOW (ref 0.30–0.70)

## 2021-10-18 MED ORDER — POTASSIUM CHLORIDE 10 MEQ/100ML IV SOLN
10.0000 meq | INTRAVENOUS | Status: AC
  Administered 2021-10-18 (×4): 10 meq via INTRAVENOUS
  Filled 2021-10-18 (×4): qty 100

## 2021-10-18 MED ORDER — LORATADINE 10 MG PO TABS
10.0000 mg | ORAL_TABLET | Freq: Every day | ORAL | Status: DC
  Administered 2021-10-18 – 2021-10-23 (×6): 10 mg via ORAL
  Filled 2021-10-18 (×7): qty 1

## 2021-10-18 MED ORDER — FLUCONAZOLE IN SODIUM CHLORIDE 400-0.9 MG/200ML-% IV SOLN
400.0000 mg | INTRAVENOUS | Status: DC
  Administered 2021-10-18 – 2021-10-22 (×5): 400 mg via INTRAVENOUS
  Filled 2021-10-18 (×6): qty 200

## 2021-10-18 MED ORDER — HEPARIN (PORCINE) 25000 UT/250ML-% IV SOLN
950.0000 [IU]/h | INTRAVENOUS | Status: DC
  Administered 2021-10-18: 950 [IU]/h via INTRAVENOUS
  Filled 2021-10-18: qty 250

## 2021-10-18 MED ORDER — HEPARIN (PORCINE) 25000 UT/250ML-% IV SOLN
1750.0000 [IU]/h | INTRAVENOUS | Status: AC
  Administered 2021-10-19: 1400 [IU]/h via INTRAVENOUS
  Filled 2021-10-18: qty 250
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
## 2021-10-18 NOTE — Progress Notes (Signed)
Pt refused SCDs 
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
## 2021-10-18 NOTE — Progress Notes (Signed)
Dr. Johney Maine notified that patient has 135mL/hr output from her JP drain. He is ok with this. Pt is complaining of being cold and 8/10 pain and pressure in abdomen. Morphine given for pain. Extra blankets applied and room temperature increased.

## 2021-10-18 NOTE — Progress Notes (Signed)
Progress Note: General Surgery Service   Chief Complaint/Subjective: Wants an antihistamine  Objective: Vital signs in last 24 hours: Temp:  [97.9 F (36.6 C)-100.6 F (38.1 C)] 98.2 F (36.8 C) (02/23 0336) Pulse Rate:  [57-151] 138 (02/23 0130) Resp:  [15-22] 16 (02/23 0130) BP: (76-120)/(45-83) 90/45 (02/22 2000) SpO2:  [88 %-98 %] 93 % (02/23 0130) Weight:  [66.8 kg] 66.8 kg (02/22 1255) Last BM Date : 10/17/21  Intake/Output from previous day: 02/22 0701 - 02/23 0700 In: 583 [P.O.:240; I.V.:192.9; IV Piggyback:150.2] Out: 999 [Urine:200; Drains:799] Intake/Output this shift: No intake/output data recorded.  GI: Abd inferior aspect of incision with some drainage, two IR drains in left abdomen.  Thin green drainage from both drains.  No evidence of cranberry juice in drains.  Lab Results: CBC  Recent Labs    10/17/21 0007 10/18/21 0239  WBC 24.0* 16.0*  HGB 10.1* 9.4*  HCT 30.6* 28.8*  PLT 423* 398    BMET Recent Labs    10/17/21 0007 10/18/21 0239  NA 133* 135  K 3.5 2.9*  CL 103 107  CO2 21* 20*  GLUCOSE 89 110*  BUN 19 16  CREATININE 1.29* 1.20*  CALCIUM 7.7* 7.8*    PT/INR Recent Labs    10/17/21 0007  LABPROT 15.9*  INR 1.3*    ABG No results for input(s): PHART, HCO3 in the last 72 hours.  Invalid input(s): PCO2, PO2  Anti-infectives: Anti-infectives (From admission, onward)    Start     Dose/Rate Route Frequency Ordered Stop   10/16/21 2200  piperacillin-tazobactam (ZOSYN) IVPB 3.375 g        3.375 g 12.5 mL/hr over 240 Minutes Intravenous Every 8 hours 10/16/21 1524 10/23/21 2159   10/16/21 1500  piperacillin-tazobactam (ZOSYN) IVPB 3.375 g        3.375 g 100 mL/hr over 30 Minutes Intravenous  Once 10/16/21 1455 10/16/21 1552       Medications: Scheduled Meds:  acetaminophen  650 mg Oral Q6H   Chlorhexidine Gluconate Cloth  6 each Topical Daily   heparin  5,000 Units Subcutaneous Q8H   levothyroxine  25 mcg Oral Q0600    mouth rinse  15 mL Mouth Rinse BID   metoCLOPramide (REGLAN) injection  10 mg Intravenous Q6H   pantoprazole  40 mg Oral Daily   vortioxetine HBr  10 mg Oral Daily   Continuous Infusions:  sodium chloride Stopped (10/18/21 0522)   amiodarone 30 mg/hr (10/18/21 0630)   methocarbamol (ROBAXIN) IV 500 mg (10/17/21 0043)   piperacillin-tazobactam (ZOSYN)  IV 12.5 mL/hr at 10/18/21 0526   potassium chloride 10 mEq (10/18/21 0647)   PRN Meds:.sodium chloride, HYDROmorphone (DILAUDID) injection, methocarbamol (ROBAXIN) IV, morphine, ondansetron (ZOFRAN) IV, prochlorperazine, simethicone  Assessment/Plan: Ms. Morr is a 56 year old female s/p open small bowel resection, primary closure of epigastric hernia and 1 hour lysis of adhesions on 10/09/21.  She presented 2/21 with concern for an anastomotic leak on imaging and two left sided IR drains were placed.  CLD - see if drainage character changes with cranberry and other colors - having gas and bowel movements JP Cr normal, amylase and lipase pending New onset a. Fib with RVR - cardiology evaluation appreciated, on amiodarone ggt, starting heparin ggt this morning. AKI - improved since recent hospital stay Hypokalemia - replacement hanging this morning Pain/nausea control Increase activity Dressing changes daily and as needed for drainage from inferior aspect of incision   LOS: 2 days   FEN: Continue  clear liquids today, see if drainage character changes ID: Zosyn, fluconazole f/u final culture results VTE: heparin, scds, Foley: None Dispo: Continued care in [facility]    Felicie Morn, MD  [facility] Use [email or web address] to contact on call provider  Daily Billing:

## 2021-10-18 NOTE — Progress Notes (Addendum)
Progress Note  Patient Name: Julie Williams Date of Encounter: 10/18/2021  [facility] Cardiologist: Shirlee More, MD   Subjective   Denies any CP or SOB last night. No cardiac awareness of atrial fibrillation.   Inpatient Medications    Scheduled Meds:  acetaminophen  650 mg Oral Q6H   Chlorhexidine Gluconate Cloth  6 each Topical Daily   levothyroxine  25 mcg Oral Q0600   mouth rinse  15 mL Mouth Rinse BID   metoCLOPramide (REGLAN) injection  10 mg Intravenous Q6H   pantoprazole  40 mg Oral Daily   vortioxetine HBr  10 mg Oral Daily   Continuous Infusions:  sodium chloride Stopped (10/18/21 0522)   amiodarone 30 mg/hr (10/18/21 0630)   fluconazole (DIFLUCAN) IV     heparin     methocarbamol (ROBAXIN) IV 500 mg (10/17/21 0043)   piperacillin-tazobactam (ZOSYN)  IV 12.5 mL/hr at 10/18/21 0526   potassium chloride 10 mEq (10/18/21 0647)   PRN Meds: sodium chloride, HYDROmorphone (DILAUDID) injection, methocarbamol (ROBAXIN) IV, morphine, ondansetron (ZOFRAN) IV, prochlorperazine, simethicone   Vital Signs    Vitals:   10/18/21 0400 10/18/21 0500 10/18/21 0600 10/18/21 0700  BP: (!) 96/59 98/71 108/81 102/78  Pulse: (!) 157 (!) 130 (!) 129 (!) 126  Resp: 16 17 (!) 22 (!) 21  Temp:      TempSrc:      SpO2: 96% 95% 95% 95%  Weight:      Height:        Intake/Output Summary (Last 24 hours) at 10/18/2021 0749 Last data filed at 10/18/2021 0656 Gross per 24 hour  Intake 583 ml  Output 999 ml  Net -416 ml   Last 3 Weights 10/17/2021 10/09/2021 10/04/2021  Weight (lbs) 147 lb 4.3 oz 128 lb 135 lb 3.2 oz  Weight (kg) 66.8 kg 58.06 kg 61.326 kg  Some encounter information is confidential and restricted. Go to Review Flowsheets activity to see all data.      Telemetry    Atrial fibrillation with HR 110-120s - Personally Reviewed  ECG    EKG 10/17/21 afib with RVR. No EKG on 2/21 which was the day of admission, no EKG from recent admission on 2/16 either. Last  EKG prior to this admission in Sept 2022 showed sinus tach - Personally Reviewed  Physical Exam   GEN: No acute distress.   Neck: No JVD Cardiac: tachycardic, irregular, no murmurs, rubs, or gallops.  Respiratory: Clear to auscultation bilaterally. GI: covered with dressing and 2 drains in place.  MS: No edema; No deformity. Neuro:  Nonfocal  Psych: Normal affect   Labs    High Sensitivity Troponin:  No results for input(s): TROPONINIHS in the last 720 hours.   Chemistry Recent Labs  Lab 10/16/21 1230 10/17/21 0007 10/18/21 0239  NA 135 133* 135  K 3.5 3.5 2.9*  CL 102 103 107  CO2 24 21* 20*  GLUCOSE 89 89 110*  BUN 22* 19 16  CREATININE 1.23* 1.29* 1.20*  CALCIUM 8.6* 7.7* 7.8*  MG  --   --  1.6*  PROT 6.9  --   --   ALBUMIN 2.7*  --   --   AST 22  --   --   ALT 14  --   --   ALKPHOS 74  --   --   BILITOT 0.8  --   --   GFRNONAA 51* 48* 53*  ANIONGAP 9 9 8     Lipids No results  for input(s): CHOL, TRIG, HDL, LABVLDL, LDLCALC, CHOLHDL in the last 168 hours.  Hematology Recent Labs  Lab 10/16/21 1230 10/17/21 0007 10/18/21 0239  WBC 30.3* 24.0* 16.0*  RBC 3.47* 3.06* 2.89*  HGB 11.5* 10.1* 9.4*  HCT 34.5* 30.6* 28.8*  MCV 99.4 100.0 99.7  MCH 33.1 33.0 32.5  MCHC 33.3 33.0 32.6  RDW 13.3 13.4 13.6  PLT 404* 423* 398   Thyroid  Recent Labs  Lab 10/17/21 1220  TSH 5.873*    BNPNo results for input(s): BNP, PROBNP in the last 168 hours.  DDimer No results for input(s): DDIMER in the last 168 hours.   Radiology    CT Abdomen Pelvis W Contrast  Result Date: 10/16/2021 CLINICAL DATA:  Postoperative abdominal pain, 1 week postop from removal of small-bowel tumor in hernia repair. EXAM: CT ABDOMEN AND PELVIS WITH CONTRAST TECHNIQUE: Multidetector CT imaging of the abdomen and pelvis was performed using the standard protocol following bolus administration of intravenous contrast. RADIATION DOSE REDUCTION: This exam was performed according to the  departmental dose-optimization program which includes automated exposure control, adjustment of the mA and/or kV according to patient size and/or use of iterative reconstruction technique. CONTRAST:  172mL OMNIPAQUE IOHEXOL 300 MG/ML  SOLN COMPARISON:  None. FINDINGS: Lower chest: Bibasilar atelectasis.  Tiny left pleural effusion. Hepatobiliary: Hepatic steatosis.  No suspicious hepatic lesion. Pancreas: No pancreatic ductal dilation or evidence of acute inflammation. Spleen: Normal in size without focal abnormality. Adrenals/Urinary Tract: Bilateral adrenal glands appear normal. No hydronephrosis. Symmetric bilateral renal enhancement. Urinary bladder is unremarkable for degree of distension. Stomach/Bowel: No enteric contrast was administered. Stomach is decompressed limiting evaluation. No pathologic dilation of small or large bowel. Surgical change of recent small bowel resection with anastomotic sutures in the midline abdomen. Adjacent to the anastomotic sutures is a gas and fluid collection which is difficult to measure as it inspissated along abdominal structures but measures approximately 9.9 x 4.4 x 11.1 cm on image 29/2. Anastomotic sutures are also present in the right lower quadrant. Vascular/Lymphatic: Normal caliber abdominal aorta. No pathologically enlarged abdominal or pelvic lymph nodes identified. Reproductive: Uterus and bilateral adnexa are unremarkable. Other: Pneumoperitoneum and free fluid with peritoneal thickening. Additional developing fluid collections along the small bowel mesentery on image 49/2 and another superior and anterior to this on image 38/2. Midline incision with gas and fluid tracking along the. Musculoskeletal: No acute osseous abnormality. IMPRESSION: 1. Surgical change of recent bowel resection with anastomotic sutures in the midline abdomen and right lower quadrant. Extensive pneumoperitoneum (greater than that expected 1 week postop) with free fluid, peritoneal  thickening and developing walled off fluid collections. Findings which are most consistent with anastomotic leak, peritonitis and developing abscesses. 2. Tiny left pleural effusion. 3. Hepatic steatosis. These results were called by telephone at the time of interpretation on 10/16/2021 at 2:29 pm to provider [facility] , who verbally acknowledged these results. Electronically Signed   By: Dahlia Bailiff M.D.   On: 10/16/2021 14:30   DG CHEST PORT 1 VIEW  Result Date: 10/17/2021 CLINICAL DATA:  Atrial fibrillation. Postop anastomotic leak for gastrointestinal surgery. EXAM: PORTABLE CHEST 1 VIEW COMPARISON:  10/16/2018 and chest CT of 10/16/2018 FINDINGS: Cervical spine fixation. Midline trachea. Normal heart size. No pleural effusion or pneumothorax. Linear bibasilar airspace disease is decreased compared to the prior exam. Increased density projecting over the left apex. IMPRESSION: Mild bibasilar atelectasis. Increased density projecting over the left apex. This is at the site of pleuroparenchymal scarring  back on 10/16/2018. Possibly secondary to osseous summation. Recommend nonemergent PA and lateral radiographs to exclude lung mass. These results will be called to the ordering clinician or representative by the Radiologist Assistant, and communication documented in the PACS or [employer]. Electronically Signed   By: Abigail Miyamoto M.D.   On: 10/17/2021 10:56   ECHOCARDIOGRAM COMPLETE  Result Date: 10/17/2021    ECHOCARDIOGRAM REPORT   Patient Name:   Julie Williams Date of Exam: 10/17/2021 Medical Rec #:  [id number]       Height:       65.0 in Accession #:    [id number]      Weight:       147.3 lb Date of Birth:  1966/05/12        BSA:          1.737 m Patient Age:    56 years        BP:           95/65 mmHg Patient Gender: F               HR:           143 bpm. Exam Location:  Inpatient Procedure: 2D Echo, Cardiac Doppler and Color Doppler Indications:    I48.91 AFIB  History:        Patient has no  prior history of Echocardiogram examinations.                 Risk Factors:Hypertension. ACUTE RESP. FAILURE W HYPOXIA.  Sonographer:    Beryle Beams Referring Phys: 11 RHONDA G BARRETT  Sonographer Comments: No subcostal window. POST OP ABDOMEN S/P IMPRESSIONS  1. Left ventricular ejection fraction, by estimation, is 60 to 65%. The left ventricle has normal function. The left ventricle has no regional wall motion abnormalities. Left ventricular diastolic parameters are indeterminate.  2. Right ventricular systolic function is normal. The right ventricular size is normal. There is normal pulmonary artery systolic pressure. The estimated right ventricular systolic pressure is 58.0 mmHg.  3. The mitral valve is normal in structure. Mild mitral valve regurgitation. No evidence of mitral stenosis.  4. Tricuspid valve regurgitation is mild to moderate.  5. The aortic valve is normal in structure. Aortic valve regurgitation is not visualized. No aortic stenosis is present.  6. The inferior vena cava is normal in size with greater than 50% respiratory variability, suggesting right atrial pressure of 3 mmHg. FINDINGS  Left Ventricle: Left ventricular ejection fraction, by estimation, is 60 to 65%. The left ventricle has normal function. The left ventricle has no regional wall motion abnormalities. The left ventricular internal cavity size was normal in size. There is  no left ventricular hypertrophy. Left ventricular diastolic parameters are indeterminate. Right Ventricle: The right ventricular size is normal. No increase in right ventricular wall thickness. Right ventricular systolic function is normal. There is normal pulmonary artery systolic pressure. The tricuspid regurgitant velocity is 2.17 m/s, and  with an assumed right atrial pressure of 3 mmHg, the estimated right ventricular systolic pressure is 99.8 mmHg. Left Atrium: Left atrial size was normal in size. Right Atrium: Right atrial size was normal in size.  Pericardium: There is no evidence of pericardial effusion. Mitral Valve: The mitral valve is normal in structure. Mild mitral valve regurgitation. No evidence of mitral valve stenosis. Tricuspid Valve: The tricuspid valve is normal in structure. Tricuspid valve regurgitation is mild to moderate. No evidence of tricuspid stenosis. Aortic Valve: The aortic valve is normal  in structure. Aortic valve regurgitation is not visualized. No aortic stenosis is present. Aortic valve mean gradient measures 4.0 mmHg. Aortic valve peak gradient measures 6.7 mmHg. Aortic valve area, by VTI measures 1.70 cm. Pulmonic Valve: The pulmonic valve was normal in structure. Pulmonic valve regurgitation is not visualized. No evidence of pulmonic stenosis. Aorta: The aortic root is normal in size and structure. Venous: The inferior vena cava is normal in size with greater than 50% respiratory variability, suggesting right atrial pressure of 3 mmHg. IAS/Shunts: No atrial level shunt detected by color flow Doppler.  LEFT VENTRICLE PLAX 2D LVIDd:         3.60 cm LVIDs:         2.30 cm LV PW:         1.10 cm LV IVS:        1.00 cm LVOT diam:     1.70 cm LV SV:         38 LV SV Index:   22 LVOT Area:     2.27 cm  LV Volumes (MOD) LV vol d, MOD A2C: 63.6 ml LV vol d, MOD A4C: 63.4 ml LV vol s, MOD A2C: 26.2 ml LV vol s, MOD A4C: 26.6 ml LV SV MOD A2C:     37.4 ml LV SV MOD A4C:     63.4 ml LV SV MOD BP:      38.0 ml RIGHT VENTRICLE RV Basal diam:  3.30 cm RV Mid diam:    2.90 cm RV S prime:     13.60 cm/s TAPSE (M-mode): 1.2 cm LEFT ATRIUM             Index        RIGHT ATRIUM           Index LA diam:        3.80 cm 2.19 cm/m   RA Area:     12.70 cm LA Vol (A2C):   54.0 ml 31.09 ml/m  RA Volume:   28.60 ml  16.47 ml/m LA Vol (A4C):   49.7 ml 28.61 ml/m LA Biplane Vol: 52.5 ml 30.23 ml/m  AORTIC VALVE                    PULMONIC VALVE AV Area (Vmax):    2.18 cm     PV Vmax:       0.72 m/s AV Area (Vmean):   1.93 cm     PV Vmean:       46.000 cm/s AV Area (VTI):     1.70 cm     PV VTI:        0.118 m AV Vmax:           129.00 cm/s  PV Peak grad:  2.1 mmHg AV Vmean:          93.100 cm/s  PV Mean grad:  1.0 mmHg AV VTI:            0.222 m AV Peak Grad:      6.7 mmHg AV Mean Grad:      4.0 mmHg LVOT Vmax:         124.00 cm/s LVOT Vmean:        79.200 cm/s LVOT VTI:          0.166 m LVOT/AV VTI ratio: 0.75  AORTA Ao Root diam: 2.70 cm Ao Asc diam:  3.10 cm TRICUSPID VALVE TR Peak grad:   18.8 mmHg TR Mean grad:   14.0  mmHg TR Vmax:        217.00 cm/s TR Vmean:       180.0 cm/s  SHUNTS Systemic VTI:  0.17 m Systemic Diam: 1.70 cm Candee Furbish MD Electronically signed by Candee Furbish MD Signature Date/Time: 10/17/2021/3:29:58 PM    Final    CT IMAGE GUIDED DRAINAGE BY PERCUTANEOUS CATHETER  Result Date: 10/16/2021 INDICATION: Recent abdominal surgery.  Intra-abdominal abscesses. EXAM: CT IMAGE GUIDED DRAINAGE BY PERCUTANEOUS CATHETER X2 RADIATION DOSE REDUCTION: This exam was performed according to the departmental dose-optimization program which includes automated exposure control, adjustment of the mA and/or kV according to patient size and/or use of iterative reconstruction technique. COMPARISON:  CT AP, 10/16/2021 MEDICATIONS: The patient is currently admitted to the hospital and receiving intravenous antibiotics. The antibiotics were administered within an appropriate time frame prior to the initiation of the procedure. ANESTHESIA/SEDATION: Moderate (conscious) sedation was employed during this procedure. A total of Versed 1 mg and Fentanyl 100 mcg was administered intravenously. Moderate Sedation Time: 60 minutes. The patient's level of consciousness and vital signs were monitored continuously by radiology nursing throughout the procedure under my direct supervision. CONTRAST:  None COMPLICATIONS: None immediate. PROCEDURE: Informed written consent was obtained from the patient and/or patient's representative after a discussion of the risks,  benefits and alternatives to treatment. The patient was placed supine on the CT gantry and a pre procedural CT was performed re-demonstrating the known abscess/fluid collection within the LEFT upper quadrant and midline. The procedure was planned. A timeout was performed prior to the initiation of the procedure. The LEFT upper quadrant and midline was prepped and draped in the usual sterile fashion. The overlying soft tissues were anesthetized with 1% lidocaine with epinephrine. Appropriate trajectory was planned with the use of a 22 gauge spinal needle. An 18 gauge trocar needle was advanced into the abscess/fluid collection at the medial LEFT upper quadrant and a short Amplatz super stiff wire was coiled within the collection. Appropriate positioning was confirmed with a limited CT scan. The tract was serially dilated allowing placement of a 12 Fr drainage catheter. Appropriate positioning was confirmed with a limited postprocedural CT scan. The procedure was repeated for the lateral LEFT upper quadrant air-containing collection. A 14 Fr drainage catheter was placed. The tubes were connected to a bulb suction and sutured in place. A dressing was placed. The patient tolerated the procedure without immediate post procedural complication. IMPRESSION: 1. Successful CT guided placement of a 12 Fr medial LEFT upper quadrant and 14 Fr lateral LEFT upper quadrant drainage catheters. 2. The medial LEFT upper quadrant drain aspirated a thin bilious tinged fluid, while the lateral LEFT upper quadrant drain aspirated thick succus. Samples were sent to the laboratory as requested by the ordering clinical team. Michaelle Birks, MD [facility] Electronically Signed   By: Michaelle Birks M.D.   On: 10/16/2021 20:29   CT IMAGE GUIDED DRAINAGE BY PERCUTANEOUS CATHETER  Result Date: 10/16/2021 INDICATION: Recent abdominal surgery.  Intra-abdominal abscesses. EXAM: CT IMAGE  GUIDED DRAINAGE BY PERCUTANEOUS CATHETER X2 RADIATION DOSE REDUCTION: This exam was performed according to the departmental dose-optimization program which includes automated exposure control, adjustment of the mA and/or kV according to patient size and/or use of iterative reconstruction technique. COMPARISON:  CT AP, 10/16/2021 MEDICATIONS: The patient is currently admitted to the hospital and receiving intravenous antibiotics. The antibiotics were administered within an appropriate time frame prior to the initiation of the procedure. ANESTHESIA/SEDATION: Moderate (conscious) sedation was  employed during this procedure. A total of Versed 1 mg and Fentanyl 100 mcg was administered intravenously. Moderate Sedation Time: 60 minutes. The patient's level of consciousness and vital signs were monitored continuously by radiology nursing throughout the procedure under my direct supervision. CONTRAST:  None COMPLICATIONS: None immediate. PROCEDURE: Informed written consent was obtained from the patient and/or patient's representative after a discussion of the risks, benefits and alternatives to treatment. The patient was placed supine on the CT gantry and a pre procedural CT was performed re-demonstrating the known abscess/fluid collection within the LEFT upper quadrant and midline. The procedure was planned. A timeout was performed prior to the initiation of the procedure. The LEFT upper quadrant and midline was prepped and draped in the usual sterile fashion. The overlying soft tissues were anesthetized with 1% lidocaine with epinephrine. Appropriate trajectory was planned with the use of a 22 gauge spinal needle. An 18 gauge trocar needle was advanced into the abscess/fluid collection at the medial LEFT upper quadrant and a short Amplatz super stiff wire was coiled within the collection. Appropriate positioning was confirmed with a limited CT scan. The tract was serially dilated allowing placement of a 12 Fr drainage  catheter. Appropriate positioning was confirmed with a limited postprocedural CT scan. The procedure was repeated for the lateral LEFT upper quadrant air-containing collection. A 14 Fr drainage catheter was placed. The tubes were connected to a bulb suction and sutured in place. A dressing was placed. The patient tolerated the procedure without immediate post procedural complication. IMPRESSION: 1. Successful CT guided placement of a 12 Fr medial LEFT upper quadrant and 14 Fr lateral LEFT upper quadrant drainage catheters. 2. The medial LEFT upper quadrant drain aspirated a thin bilious tinged fluid, while the lateral LEFT upper quadrant drain aspirated thick succus. Samples were sent to the laboratory as requested by the ordering clinical team. Michaelle Birks, MD [facility] Electronically Signed   By: Michaelle Birks M.D.   On: 10/16/2021 20:29    Cardiac Studies   Echo 10/17/2021 1. Left ventricular ejection fraction, by estimation, is 60 to 65%. The  left ventricle has normal function. The left ventricle has no regional  wall motion abnormalities. Left ventricular diastolic parameters are  indeterminate.   2. Right ventricular systolic function is normal. The right ventricular  size is normal. There is normal pulmonary artery systolic pressure. The  estimated right ventricular systolic pressure is 58.5 mmHg.   3. The mitral valve is normal in structure. Mild mitral valve  regurgitation. No evidence of mitral stenosis.   4. Tricuspid valve regurgitation is mild to moderate.   5. The aortic valve is normal in structure. Aortic valve regurgitation is  not visualized. No aortic stenosis is present.   6. The inferior vena cava is normal in size with greater than 50%  respiratory variability, suggesting right atrial pressure of 3 mmHg.   Patient Profile     56 y.o. female with PMH of HTN, tobacco use, perforated colon/rectum with multiple  complications, chronic pain issues who is seeing for afib. She recently underwent exploratory laparotomy, lysis of adhesions, excision of small bowel mass, and primary closure of epigastric incisional hernia with open small bowel resection for perforated jejunum on 10/09/2021.She came to the emergency room on 2/21 with bloating and drainage from the lower aspect of her incision and found to have anastomotic leak on CT. She was treated for antibiotics and intra-abdominal drain placement by IR. EKG obtained on hospital  day #2 showed rapid afib.   Assessment & Plan    Atrial fibrillation with RVR  - received 3 doses of IV lopressor on 2/22, SBP went down to the 80s. IV amiodarone was added on 2/22 for better rate control. IV heparin started.   - Echo 10/17/2021 EF 60-65%, mild MR, mild to moderate TR  - overnight, HR 110-120s. No cardiac awareness of afib. Limited option in this case, SBP still in the high 90s, unable to add further rate control medication. Hopefully with abx and resolution of sepsis, HR will begin to improve.   Recent AKI: Cr high at 3.31 during recent admission on 2/16, improved to 1.23 at the beginning of this admission on 2/21.  Recent open small bowel resection with primary closure of epigastric hernia and lysis of adhesion on 2/14 with anastomotic leak on imaging on 2/21 s/p 2 left-sided drain by IR  Sepsis with recent intra-abdominal abscess: fever of 100.6 on 2/22. Highest temp last night was 100.1. Receiving IV abx.    Hypokalemia: being repleted    Initially patient wished to leave AMA on 2/22 as she wanted to go home to take care of her chicken and dog. But she is clearly too ill to manage her condition at home. She eventually agreed to stay.    For questions or updates, please contact [place] Please consult [email or web address] for contact info under        Signed, Almyra Deforest, [place]  10/18/2021, 7:49 AM    Personally seen and examined. Agree with APP above with the  following comments: Briefly 56 yo F with small bowel resection s/p anastomotic leak with new AF RVR complicated by hypotension with AV nodal agents Patient is very frustrated as being here.  She needs to get back to her animals.  She still has dressing leaks when she stands. Exam notable for AF RVR, she has two drains with mild abdominal pain and slight drainage in drain #1 Labs notable for improved creatinine and hypokalemia Personally reviewed relevant tests; RVR has improved but is still present Would recommend  - continue IV amiodarone (see prior notes for discussions of risk/benefits) - if tolerates Iv heparin for 24 hours can switch to [place] 10/19/21 - patient is amenable to staying today  Rudean Haskell, [place]  [address] [place], [place] [address] [phone]  11:07 AM

## 2021-10-18 NOTE — Progress Notes (Signed)
Pharmacy Antibiotic Note  Julie Williams is a 56 y.o. female admitted on 10/16/2021 with  prelim report of mod yeast in 1 of 2 abscess Cx .  Pharmacy has been consulted for diflucan dosing.  Plan: Diflucan 400mg  IV q24  Height: 5\' 5"  (165.1 cm) Weight: 66.8 kg (147 lb 4.3 oz) IBW/kg (Calculated) : 57  Temp (24hrs), Avg:99 F (37.2 C), Min:97.9 F (36.6 C), Max:100.6 F (38.1 C)  Recent Labs  Lab 10/12/21 0346 10/12/21 1506 10/16/21 1230 10/16/21 1522 10/17/21 0007 10/18/21 0239  WBC 5.5  --  30.3*  --  24.0* 16.0*  CREATININE 3.20* 2.72* 1.23*  --  1.29* 1.20*  LATICACIDVEN  --   --   --  1.3 1.3  --     Estimated Creatinine Clearance: 46.5 mL/min (A) (by C-G formula based on SCr of 1.2 mg/dL (H)).    Allergies  Allergen Reactions   Bee Venom Swelling     Thank you for allowing pharmacy to be a part of this patients care.  Kara Mead 10/18/2021 7:31 AM

## 2021-10-18 NOTE — Progress Notes (Signed)
Pt allowed IV team to get a 2nd IV. Amiodarone restarted at 60mg /hr.

## 2021-10-18 NOTE — Progress Notes (Signed)
Pt noted to have 100.1 axillary temp. Ice packs applied to groin and armpits and room temperature turned down.

## 2021-10-19 ENCOUNTER — Other Ambulatory Visit (HOSPITAL_COMMUNITY): Payer: Self-pay

## 2021-10-19 ENCOUNTER — Inpatient Hospital Stay (HOSPITAL_COMMUNITY): Payer: Medicare Other

## 2021-10-19 ENCOUNTER — Encounter (HOSPITAL_COMMUNITY): Payer: Self-pay | Admitting: Surgery

## 2021-10-19 LAB — HEPARIN LEVEL (UNFRACTIONATED)
Heparin Unfractionated: 0.13 IU/mL — ABNORMAL LOW (ref 0.30–0.70)
Heparin Unfractionated: 0.21 IU/mL — ABNORMAL LOW (ref 0.30–0.70)
Heparin Unfractionated: <0.1 IU/mL — ABNORMAL LOW (ref 0.30–0.70)

## 2021-10-19 LAB — BASIC METABOLIC PANEL
Anion gap: 8 (ref 5–15)
Anion gap: 8 (ref 5–15)
BUN: 12 mg/dL (ref 6–20)
BUN: 13 mg/dL (ref 6–20)
CO2: 21 mmol/L — ABNORMAL LOW (ref 22–32)
CO2: 22 mmol/L (ref 22–32)
Calcium: 7.7 mg/dL — ABNORMAL LOW (ref 8.9–10.3)
Calcium: 7.7 mg/dL — ABNORMAL LOW (ref 8.9–10.3)
Chloride: 103 mmol/L (ref 98–111)
Chloride: 106 mmol/L (ref 98–111)
Creatinine, Ser: 1.1 mg/dL — ABNORMAL HIGH (ref 0.44–1.00)
Creatinine, Ser: 1.18 mg/dL — ABNORMAL HIGH (ref 0.44–1.00)
GFR, Estimated: 54 mL/min — ABNORMAL LOW (ref >60)
GFR, Estimated: 59 mL/min — ABNORMAL LOW (ref >60)
Glucose, Bld: 83 mg/dL (ref 70–99)
Glucose, Bld: 86 mg/dL (ref 70–99)
Potassium: 3.1 mmol/L — ABNORMAL LOW (ref 3.5–5.1)
Potassium: 3.3 mmol/L — ABNORMAL LOW (ref 3.5–5.1)
Sodium: 133 mmol/L — ABNORMAL LOW (ref 135–145)
Sodium: 135 mmol/L (ref 135–145)

## 2021-10-19 LAB — CBC
HCT: 29.6 % — ABNORMAL LOW (ref 36.0–46.0)
Hemoglobin: 9.6 g/dL — ABNORMAL LOW (ref 12.0–15.0)
MCH: 32.2 pg (ref 26.0–34.0)
MCHC: 32.4 g/dL (ref 30.0–36.0)
MCV: 99.3 fL (ref 80.0–100.0)
Platelets: 466 10*3/uL — ABNORMAL HIGH (ref 150–400)
RBC: 2.98 MIL/uL — ABNORMAL LOW (ref 3.87–5.11)
RDW: 13.8 % (ref 11.5–15.5)
WBC: 11.6 10*3/uL — ABNORMAL HIGH (ref 4.0–10.5)
nRBC: 0 % (ref 0.0–0.2)

## 2021-10-19 MED ORDER — LIP MEDEX EX OINT
TOPICAL_OINTMENT | CUTANEOUS | Status: DC | PRN
  Filled 2021-10-19: qty 7

## 2021-10-19 MED ORDER — PROSOURCE PLUS PO LIQD: 30.0000 mL | Freq: Every day | ORAL | Status: DC

## 2021-10-19 MED ORDER — IOHEXOL 300 MG/ML  SOLN
100.0000 mL | Freq: Once | INTRAMUSCULAR | Status: AC | PRN
  Administered 2021-10-19: 100 mL via INTRAVENOUS

## 2021-10-19 MED ORDER — AMIODARONE HCL 200 MG PO TABS
400.0000 mg | ORAL_TABLET | Freq: Two times a day (BID) | ORAL | Status: DC
  Administered 2021-10-19 – 2021-10-23 (×10): 400 mg via ORAL
  Filled 2021-10-19 (×10): qty 2

## 2021-10-19 MED ORDER — ENOXAPARIN SODIUM 80 MG/0.8ML IJ SOSY
70.0000 mg | PREFILLED_SYRINGE | Freq: Two times a day (BID) | INTRAMUSCULAR | Status: DC
Start: 2021-10-19 — End: 2021-10-23
  Administered 2021-10-19 – 2021-10-23 (×7): 70 mg via SUBCUTANEOUS
  Filled 2021-10-19 (×7): qty 0.7

## 2021-10-19 MED ORDER — BOOST / RESOURCE BREEZE PO LIQD CUSTOM
1.0000 | Freq: Two times a day (BID) | ORAL | Status: DC
  Administered 2021-10-19 – 2021-10-21 (×4): 1 via ORAL

## 2021-10-19 MED ORDER — ADULT MULTIVITAMIN W/MINERALS CH
1.0000 | ORAL_TABLET | Freq: Every day | ORAL | Status: DC
  Administered 2021-10-19 – 2021-10-20 (×2): 1 via ORAL
  Filled 2021-10-19 (×2): qty 1

## 2021-10-19 MED ORDER — POTASSIUM CHLORIDE CRYS ER 20 MEQ PO TBCR
40.0000 meq | EXTENDED_RELEASE_TABLET | Freq: Once | ORAL | Status: AC
  Administered 2021-10-19: 40 meq via ORAL
  Filled 2021-10-19: qty 2

## 2021-10-19 MED ORDER — IOHEXOL 9 MG/ML PO SOLN
ORAL | Status: AC
Start: 2021-10-19 — End: 2021-10-19
  Filled 2021-10-19: qty 1000

## 2021-10-19 MED ORDER — IOHEXOL 9 MG/ML PO SOLN
500.0000 mL | ORAL | Status: AC
  Administered 2021-10-19: 500 mL via ORAL
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
## 2021-10-19 NOTE — Progress Notes (Signed)
Referring Physician(s): Stechschulte,P  Supervising Physician: Arne Cleveland  Patient Status:  [facility] - In-pt  Chief Complaint: Abdominal pain/fluid collections   Subjective: Pt with some cont abd discomfort/distension; now has ostomy bag collecting drainage from inferior aspect of midline wound   Allergies: Bee venom  Medications: Prior to Admission medications   Medication Sig Start Date End Date Taking? Authorizing Provider  amLODipine (NORVASC) 10 MG tablet Take 10 mg by mouth daily.   Yes [provider]  Biotin w/ Vitamins C & E (HAIR/SKIN/NAILS PO) Take 1 tablet by mouth daily.   Yes [provider]  Cholecalciferol (VITAMIN D3) 250 MCG (10000 UT) TABS Take 10,000 mcg by mouth daily.   Yes [provider]  EPINEPHrine 0.3 mg/0.3 mL IJ SOAJ injection Inject 0.3 mg into the muscle as needed for anaphylaxis. 05/12/21  Yes Sponseller, Gypsy Balsam, PA-C  levothyroxine (SYNTHROID) 25 MCG tablet Take 1 tablet (25 mcg total) by mouth daily before breakfast. 05/08/21  Yes Aslam, Sadia, MD  morphine (MSIR) 30 MG tablet Take 1 tablet (30 mg total) by mouth every 6 (six) hours as needed for severe pain. Patient taking differently: Take 30 mg by mouth every 5 (five) hours as needed for severe pain. 04/24/21  Yes Aslam, Loralyn Freshwater, MD  naloxone (NARCAN) nasal spray 4 mg/0.1 mL 1 spray as needed (overdose). 05/22/21  Yes [provider]  omeprazole (PRILOSEC) 40 MG capsule TAKE 1 CAPSULE BY MOUTH EVERY DAY 09/28/20  Yes Katsadouros, Vasilios, MD  polyethylene glycol (MIRALAX / GLYCOLAX) 17 g packet Take 17 g by mouth daily. Patient taking differently: Take 17 g by mouth daily as needed for moderate constipation. 92/92/44  Yes Leighton Ruff, MD  vortioxetine HBr (TRINTELLIX) 10 MG TABS tablet Take 1 tablet (10 mg total) by mouth daily. 01/18/21  Yes Katsadouros, Vasilios, MD     Vital Signs: BP 117/86    Pulse 90    Temp 99.9 F (37.7 C) (Oral)    Resp 16     Ht 5\' 5"  (6.286 m)    Wt 147 lb 4.3 oz (66.8 kg)    SpO2 94%    BMI 24.51 kg/m   Physical Exam :awake/alert; left abd drains intact, insertion sites ok; there was  small amount of drainage from left lateral abd insertion site; OP from 12 fr drain 90 cc, 14 fr drain 10 cc; both drains flushed  Imaging: CT Abdomen Pelvis W Contrast  Result Date: 10/16/2021 CLINICAL DATA:  Postoperative abdominal pain, 1 week postop from removal of small-bowel tumor in hernia repair. EXAM: CT ABDOMEN AND PELVIS WITH CONTRAST TECHNIQUE: Multidetector CT imaging of the abdomen and pelvis was performed using the standard protocol following bolus administration of intravenous contrast. RADIATION DOSE REDUCTION: This exam was performed according to the departmental dose-optimization program which includes automated exposure control, adjustment of the mA and/or kV according to patient size and/or use of iterative reconstruction technique. CONTRAST:  142mL OMNIPAQUE IOHEXOL 300 MG/ML  SOLN COMPARISON:  None. FINDINGS: Lower chest: Bibasilar atelectasis.  Tiny left pleural effusion. Hepatobiliary: Hepatic steatosis.  No suspicious hepatic lesion. Pancreas: No pancreatic ductal dilation or evidence of acute inflammation. Spleen: Normal in size without focal abnormality. Adrenals/Urinary Tract: Bilateral adrenal glands appear normal. No hydronephrosis. Symmetric bilateral renal enhancement. Urinary bladder is unremarkable for degree of distension. Stomach/Bowel: No enteric contrast was administered. Stomach is decompressed limiting evaluation. No pathologic dilation of small or large bowel. Surgical change of recent small bowel resection with anastomotic sutures  in the midline abdomen. Adjacent to the anastomotic sutures is a gas and fluid collection which is difficult to measure as it inspissated along abdominal structures but measures approximately 9.9 x 4.4 x 11.1 cm on image 29/2. Anastomotic sutures are also present in the right  lower quadrant. Vascular/Lymphatic: Normal caliber abdominal aorta. No pathologically enlarged abdominal or pelvic lymph nodes identified. Reproductive: Uterus and bilateral adnexa are unremarkable. Other: Pneumoperitoneum and free fluid with peritoneal thickening. Additional developing fluid collections along the small bowel mesentery on image 49/2 and another superior and anterior to this on image 38/2. Midline incision with gas and fluid tracking along the. Musculoskeletal: No acute osseous abnormality. IMPRESSION: 1. Surgical change of recent bowel resection with anastomotic sutures in the midline abdomen and right lower quadrant. Extensive pneumoperitoneum (greater than that expected 1 week postop) with free fluid, peritoneal thickening and developing walled off fluid collections. Findings which are most consistent with anastomotic leak, peritonitis and developing abscesses. 2. Tiny left pleural effusion. 3. Hepatic steatosis. These results were called by telephone at the time of interpretation on 10/16/2021 at 2:29 pm to provider [facility] , who verbally acknowledged these results. Electronically Signed   By: Dahlia Bailiff M.D.   On: 10/16/2021 14:30   DG CHEST PORT 1 VIEW  Result Date: 10/17/2021 CLINICAL DATA:  Atrial fibrillation. Postop anastomotic leak for gastrointestinal surgery. EXAM: PORTABLE CHEST 1 VIEW COMPARISON:  10/16/2018 and chest CT of 10/16/2018 FINDINGS: Cervical spine fixation. Midline trachea. Normal heart size. No pleural effusion or pneumothorax. Linear bibasilar airspace disease is decreased compared to the prior exam. Increased density projecting over the left apex. IMPRESSION: Mild bibasilar atelectasis. Increased density projecting over the left apex. This is at the site of pleuroparenchymal scarring back on 10/16/2018. Possibly secondary to osseous summation. Recommend nonemergent PA and lateral radiographs to exclude lung mass. These results will be called to the ordering  clinician or representative by the Radiologist Assistant, and communication documented in the PACS or [employer]. Electronically Signed   By: Abigail Miyamoto M.D.   On: 10/17/2021 10:56   ECHOCARDIOGRAM COMPLETE  Result Date: 10/17/2021    ECHOCARDIOGRAM REPORT   Patient Name:   FALLON HAECKER Date of Exam: 10/17/2021 Medical Rec #:  [id number]       Height:       65.0 in Accession #:    [id number]      Weight:       147.3 lb Date of Birth:  1966-02-04        BSA:          1.737 m Patient Age:    40 years        BP:           95/65 mmHg Patient Gender: F               HR:           143 bpm. Exam Location:  Inpatient Procedure: 2D Echo, Cardiac Doppler and Color Doppler Indications:    I48.91 AFIB  History:        Patient has no prior history of Echocardiogram examinations.                 Risk Factors:Hypertension. ACUTE RESP. FAILURE W HYPOXIA.  Sonographer:    Beryle Beams Referring Phys: 79 RHONDA G BARRETT  Sonographer Comments: No subcostal window. POST OP ABDOMEN S/P IMPRESSIONS  1. Left ventricular ejection fraction, by estimation, is 60 to 65%. The left ventricle  has normal function. The left ventricle has no regional wall motion abnormalities. Left ventricular diastolic parameters are indeterminate.  2. Right ventricular systolic function is normal. The right ventricular size is normal. There is normal pulmonary artery systolic pressure. The estimated right ventricular systolic pressure is 33.2 mmHg.  3. The mitral valve is normal in structure. Mild mitral valve regurgitation. No evidence of mitral stenosis.  4. Tricuspid valve regurgitation is mild to moderate.  5. The aortic valve is normal in structure. Aortic valve regurgitation is not visualized. No aortic stenosis is present.  6. The inferior vena cava is normal in size with greater than 50% respiratory variability, suggesting right atrial pressure of 3 mmHg. FINDINGS  Left Ventricle: Left ventricular ejection fraction, by estimation, is 60 to  65%. The left ventricle has normal function. The left ventricle has no regional wall motion abnormalities. The left ventricular internal cavity size was normal in size. There is  no left ventricular hypertrophy. Left ventricular diastolic parameters are indeterminate. Right Ventricle: The right ventricular size is normal. No increase in right ventricular wall thickness. Right ventricular systolic function is normal. There is normal pulmonary artery systolic pressure. The tricuspid regurgitant velocity is 2.17 m/s, and  with an assumed right atrial pressure of 3 mmHg, the estimated right ventricular systolic pressure is 95.1 mmHg. Left Atrium: Left atrial size was normal in size. Right Atrium: Right atrial size was normal in size. Pericardium: There is no evidence of pericardial effusion. Mitral Valve: The mitral valve is normal in structure. Mild mitral valve regurgitation. No evidence of mitral valve stenosis. Tricuspid Valve: The tricuspid valve is normal in structure. Tricuspid valve regurgitation is mild to moderate. No evidence of tricuspid stenosis. Aortic Valve: The aortic valve is normal in structure. Aortic valve regurgitation is not visualized. No aortic stenosis is present. Aortic valve mean gradient measures 4.0 mmHg. Aortic valve peak gradient measures 6.7 mmHg. Aortic valve area, by VTI measures 1.70 cm. Pulmonic Valve: The pulmonic valve was normal in structure. Pulmonic valve regurgitation is not visualized. No evidence of pulmonic stenosis. Aorta: The aortic root is normal in size and structure. Venous: The inferior vena cava is normal in size with greater than 50% respiratory variability, suggesting right atrial pressure of 3 mmHg. IAS/Shunts: No atrial level shunt detected by color flow Doppler.  LEFT VENTRICLE PLAX 2D LVIDd:         3.60 cm LVIDs:         2.30 cm LV PW:         1.10 cm LV IVS:        1.00 cm LVOT diam:     1.70 cm LV SV:         38 LV SV Index:   22 LVOT Area:     2.27 cm  LV  Volumes (MOD) LV vol d, MOD A2C: 63.6 ml LV vol d, MOD A4C: 63.4 ml LV vol s, MOD A2C: 26.2 ml LV vol s, MOD A4C: 26.6 ml LV SV MOD A2C:     37.4 ml LV SV MOD A4C:     63.4 ml LV SV MOD BP:      38.0 ml RIGHT VENTRICLE RV Basal diam:  3.30 cm RV Mid diam:    2.90 cm RV S prime:     13.60 cm/s TAPSE (M-mode): 1.2 cm LEFT ATRIUM             Index        RIGHT ATRIUM  Index LA diam:        3.80 cm 2.19 cm/m   RA Area:     12.70 cm LA Vol (A2C):   54.0 ml 31.09 ml/m  RA Volume:   28.60 ml  16.47 ml/m LA Vol (A4C):   49.7 ml 28.61 ml/m LA Biplane Vol: 52.5 ml 30.23 ml/m  AORTIC VALVE                    PULMONIC VALVE AV Area (Vmax):    2.18 cm     PV Vmax:       0.72 m/s AV Area (Vmean):   1.93 cm     PV Vmean:      46.000 cm/s AV Area (VTI):     1.70 cm     PV VTI:        0.118 m AV Vmax:           129.00 cm/s  PV Peak grad:  2.1 mmHg AV Vmean:          93.100 cm/s  PV Mean grad:  1.0 mmHg AV VTI:            0.222 m AV Peak Grad:      6.7 mmHg AV Mean Grad:      4.0 mmHg LVOT Vmax:         124.00 cm/s LVOT Vmean:        79.200 cm/s LVOT VTI:          0.166 m LVOT/AV VTI ratio: 0.75  AORTA Ao Root diam: 2.70 cm Ao Asc diam:  3.10 cm TRICUSPID VALVE TR Peak grad:   18.8 mmHg TR Mean grad:   14.0 mmHg TR Vmax:        217.00 cm/s TR Vmean:       180.0 cm/s  SHUNTS Systemic VTI:  0.17 m Systemic Diam: 1.70 cm Candee Furbish MD Electronically signed by Candee Furbish MD Signature Date/Time: 10/17/2021/3:29:58 PM    Final    CT IMAGE GUIDED DRAINAGE BY PERCUTANEOUS CATHETER  Result Date: 10/16/2021 INDICATION: Recent abdominal surgery.  Intra-abdominal abscesses. EXAM: CT IMAGE GUIDED DRAINAGE BY PERCUTANEOUS CATHETER X2 RADIATION DOSE REDUCTION: This exam was performed according to the departmental dose-optimization program which includes automated exposure control, adjustment of the mA and/or kV according to patient size and/or use of iterative reconstruction technique. COMPARISON:  CT AP, 10/16/2021  MEDICATIONS: The patient is currently admitted to the hospital and receiving intravenous antibiotics. The antibiotics were administered within an appropriate time frame prior to the initiation of the procedure. ANESTHESIA/SEDATION: Moderate (conscious) sedation was employed during this procedure. A total of Versed 1 mg and Fentanyl 100 mcg was administered intravenously. Moderate Sedation Time: 60 minutes. The patient's level of consciousness and vital signs were monitored continuously by radiology nursing throughout the procedure under my direct supervision. CONTRAST:  None COMPLICATIONS: None immediate. PROCEDURE: Informed written consent was obtained from the patient and/or patient's representative after a discussion of the risks, benefits and alternatives to treatment. The patient was placed supine on the CT gantry and a pre procedural CT was performed re-demonstrating the known abscess/fluid collection within the LEFT upper quadrant and midline. The procedure was planned. A timeout was performed prior to the initiation of the procedure. The LEFT upper quadrant and midline was prepped and draped in the usual sterile fashion. The overlying soft tissues were anesthetized with 1% lidocaine with epinephrine. Appropriate trajectory was planned with the use of a 22 gauge spinal  needle. An 18 gauge trocar needle was advanced into the abscess/fluid collection at the medial LEFT upper quadrant and a short Amplatz super stiff wire was coiled within the collection. Appropriate positioning was confirmed with a limited CT scan. The tract was serially dilated allowing placement of a 12 Fr drainage catheter. Appropriate positioning was confirmed with a limited postprocedural CT scan. The procedure was repeated for the lateral LEFT upper quadrant air-containing collection. A 14 Fr drainage catheter was placed. The tubes were connected to a bulb suction and sutured in place. A dressing was placed. The patient tolerated the  procedure without immediate post procedural complication. IMPRESSION: 1. Successful CT guided placement of a 12 Fr medial LEFT upper quadrant and 14 Fr lateral LEFT upper quadrant drainage catheters. 2. The medial LEFT upper quadrant drain aspirated a thin bilious tinged fluid, while the lateral LEFT upper quadrant drain aspirated thick succus. Samples were sent to the laboratory as requested by the ordering clinical team. Michaelle Birks, MD [facility] Radiology Electronically Signed   By: Michaelle Birks M.D.   On: 10/16/2021 20:29   CT IMAGE GUIDED DRAINAGE BY PERCUTANEOUS CATHETER  Result Date: 10/16/2021 INDICATION: Recent abdominal surgery.  Intra-abdominal abscesses. EXAM: CT IMAGE GUIDED DRAINAGE BY PERCUTANEOUS CATHETER X2 RADIATION DOSE REDUCTION: This exam was performed according to the departmental dose-optimization program which includes automated exposure control, adjustment of the mA and/or kV according to patient size and/or use of iterative reconstruction technique. COMPARISON:  CT AP, 10/16/2021 MEDICATIONS: The patient is currently admitted to the hospital and receiving intravenous antibiotics. The antibiotics were administered within an appropriate time frame prior to the initiation of the procedure. ANESTHESIA/SEDATION: Moderate (conscious) sedation was employed during this procedure. A total of Versed 1 mg and Fentanyl 100 mcg was administered intravenously. Moderate Sedation Time: 60 minutes. The patient's level of consciousness and vital signs were monitored continuously by radiology nursing throughout the procedure under my direct supervision. CONTRAST:  None COMPLICATIONS: None immediate. PROCEDURE: Informed written consent was obtained from the patient and/or patient's representative after a discussion of the risks, benefits and alternatives to treatment. The patient was placed supine on the CT gantry and a pre procedural CT was performed  re-demonstrating the known abscess/fluid collection within the LEFT upper quadrant and midline. The procedure was planned. A timeout was performed prior to the initiation of the procedure. The LEFT upper quadrant and midline was prepped and draped in the usual sterile fashion. The overlying soft tissues were anesthetized with 1% lidocaine with epinephrine. Appropriate trajectory was planned with the use of a 22 gauge spinal needle. An 18 gauge trocar needle was advanced into the abscess/fluid collection at the medial LEFT upper quadrant and a short Amplatz super stiff wire was coiled within the collection. Appropriate positioning was confirmed with a limited CT scan. The tract was serially dilated allowing placement of a 12 Fr drainage catheter. Appropriate positioning was confirmed with a limited postprocedural CT scan. The procedure was repeated for the lateral LEFT upper quadrant air-containing collection. A 14 Fr drainage catheter was placed. The tubes were connected to a bulb suction and sutured in place. A dressing was placed. The patient tolerated the procedure without immediate post procedural complication. IMPRESSION: 1. Successful CT guided placement of a 12 Fr medial LEFT upper quadrant and 14 Fr lateral LEFT upper quadrant drainage catheters. 2. The medial LEFT upper quadrant drain aspirated a thin bilious tinged fluid, while the lateral LEFT upper quadrant drain aspirated thick succus.  Samples were sent to the laboratory as requested by the ordering clinical team. Michaelle Birks, MD [facility] Radiology Electronically Signed   By: Michaelle Birks M.D.   On: 10/16/2021 20:29    Labs:  CBC: Recent Labs    10/17/21 0007 10/18/21 0239 10/18/21 2348 10/19/21 1602  WBC 24.0* 16.0* 14.0* 11.6*  HGB 10.1* 9.4* 9.7* 9.6*  HCT 30.6* 28.8* 29.7* 29.6*  PLT 423* 398 403* 466*    COAGS: Recent Labs    10/17/21 0007  INR 1.3*    BMP: Recent Labs     10/16/21 1230 10/17/21 0007 10/18/21 0239 10/18/21 2348  NA 135 133* 135 135  K 3.5 3.5 2.9* 3.1*  CL 102 103 107 106  CO2 24 21* 20* 21*  GLUCOSE 89 89 110* 83  BUN 22* 19 16 13   CALCIUM 8.6* 7.7* 7.8* 7.7*  CREATININE 1.23* 1.29* 1.20* 1.18*  GFRNONAA 51* 48* 53* 54*    LIVER FUNCTION TESTS: Recent Labs    10/16/21 1230  BILITOT 0.8  AST 22  ALT 14  ALKPHOS 74  PROT 6.9  ALBUMIN 2.7*    Assessment and Plan: Pt s/p exploratory laparotomy, lysis of adhesions, excision of small bowel mass, and primary closure of epigastric incisional hernia 10/09/2021 .  Pathology showed no evidence of malignancy. Now with post op abd fluid collections, s/p left abd drain placements (12 and 14 fr to JP bulb) on 2/21(?anast leak); afebrile; WBC 14(16); hgb 9.7(9.4), creat 1.18, fluid cx- enterobacter, rare candida; CT C/A/P final results pending but persistent abd fluid collections remain on prelim look; will convert JP's to gravity bag drains due to possibility of fistulous comm/anast leak   Electronically Signed: D. Rowe Robert, PA-C 10/19/2021, 4:29 PM   I spent a total of 15 Minutes at the the patient's bedside AND on the patient's hospital floor or unit, greater than 50% of which was counseling/coordinating care for abdominal abscess drains    Patient ID: Julie Williams, female   DOB: 04-23-1966, 56 y.o.   MRN: [id number]

## 2021-10-19 NOTE — Progress Notes (Signed)
ANTICOAGULATION CONSULT NOTE  Pharmacy Consult for IV heparin Indication: atrial fibrillation  Allergies  Allergen Reactions   Bee Venom Swelling    Patient Measurements: Height: 5\' 5"  (165.1 cm) Weight: 66.8 kg (147 lb 4.3 oz) IBW/kg (Calculated) : 57 Heparin Dosing Weight: 66.8 kg  Vital Signs: Temp: 100.6 F (38.1 C) (02/24 0400) Temp Source: Axillary (02/24 0400) BP: 98/62 (02/24 0700) Pulse Rate: 81 (02/24 0700)  Labs: Recent Labs    10/17/21 0007 10/18/21 0239 10/18/21 1608 10/18/21 2348 10/19/21 0647  HGB 10.1* 9.4*  --  9.7*  --   HCT 30.6* 28.8*  --  29.7*  --   PLT 423* 398  --  403*  --   LABPROT 15.9*  --   --   --   --   INR 1.3*  --   --   --   --   HEPARINUNFRC  --   --  <0.10* <0.10* 0.21*  CREATININE 1.29* 1.20*  --  1.18*  --     Estimated Creatinine Clearance: 47.3 mL/min (A) (by C-G formula based on SCr of 1.18 mg/dL (H)).  Assessment: 33 yoF s/p open small bowel resection and primary closure of epigastric hernia on 2/14, presented 2/21 with concern for anastomotic leak on imaging and 2 drains placed. Now with new onset afib. Pharmacy consulted to start IV heparin with no bolus per Surgery.  Today, 10/19/2021: Heparin level remains subtherapeutic but higher after rate increase Hgb low but stable; Plt now slightly elevated No interruptions in therapy or bleeding per RN  Goal of Therapy:  Heparin level 0.3-0.7 units/ml Monitor platelets by anticoagulation protocol: Yes   Plan:  Increase IV heparin to 1750 units/hr, no bolus Check heparin level 8 hours after rate change Daily CBC, heparin level Monitor closely for s/sx of bleeding F/u plans for long-term anticoag  Reuel Boom, PharmD, BCPS [phone] 10/19/2021, 7:17 AM

## 2021-10-19 NOTE — Progress Notes (Signed)
Pharmacy Brief Note - Anticoagulation Follow Up:  Patient is a 52 yoF on heparin infusion for atrial fibrillation. For full history, see note by Reuel Boom, PharmD from earlier today.   Assessment: HL = 0.13 has decreased despite increasing rate of heparin drip to 1750 units/hr Confirmed with RN that heparin infusing at correct rate. No issues with line. No interruptions in heparin infusion. No signs of bleeding. SCr 1.1, CrCl ~ 50 mL/min  Goal: HL 0.3 - 0.7  Plan: Reached out to surgeon to discuss anticoagulation and persistently low HL despite increasing rate of heparin infusion. Verbal order received to transition to therapeutic LMWH.   Stop heparin infusion Initiate enoxaparin 70 mg (1 mg/kg) subQ q12h  Will d/c heparin per pharmacy consult. Pharmacy to sign off.   Lenis Noon, PharmD 10/19/21 5:55 PM

## 2021-10-19 NOTE — Progress Notes (Signed)
Pt began crying and questioned "what is the worst thing that could happen if I walk out of here right now?" This nurse told pt she could potentially become very septic and die. Pt stated, "That'd be alright." Pt made several concerning statements alluding to wanting to be dead and states that "They're taking my animals from me anyway". Pt refused offer to call a chaplain to speak with. Psych consult is needed.

## 2021-10-19 NOTE — Progress Notes (Signed)
Progress Note: General Surgery Service   Chief Complaint/Subjective: Talked about wanting to die earlier which was very concerning to the nursing staff.  Having known this patient for some time, I had a frank discussion with her - she does not want to harm herself or commit suicide.  She is just incredibly frustrated with this situation and was expressing her frustration.  She does not want to see a psychiatrist - feels this would be a waste of time since she does not feel suicidal.  Objective: Vital signs in last 24 hours: Temp:  [98.1 F (36.7 C)-100.6 F (38.1 C)] 98.4 F (36.9 C) (02/24 0800) Pulse Rate:  [78-137] 81 (02/24 0700) Resp:  [12-28] 13 (02/24 0700) BP: (68-112)/(47-77) 98/62 (02/24 0700) SpO2:  [77 %-99 %] 95 % (02/24 0700) Last BM Date : 10/18/21  Intake/Output from previous day: 02/23 0701 - 02/24 0700 In: 1089.5 [I.V.:390.2; IV Piggyback:699.3] Out: 225 [Urine:125; Drains:100] Intake/Output this shift: Total I/O In: -  Out: 300 [Urine:300]  GI: Abd inferior aspect of incision with significant drainage.  IR drains left abdomen output decreased.  No evidence of color change to any drainage with cranberry juice  Lab Results: CBC  Recent Labs    10/18/21 0239 10/18/21 2348  WBC 16.0* 14.0*  HGB 9.4* 9.7*  HCT 28.8* 29.7*  PLT 398 403*    BMET Recent Labs    10/18/21 0239 10/18/21 2348  NA 135 135  K 2.9* 3.1*  CL 107 106  CO2 20* 21*  GLUCOSE 110* 83  BUN 16 13  CREATININE 1.20* 1.18*  CALCIUM 7.8* 7.7*    PT/INR Recent Labs    10/17/21 0007  LABPROT 15.9*  INR 1.3*    ABG No results for input(s): PHART, HCO3 in the last 72 hours.  Invalid input(s): PCO2, PO2  Anti-infectives: Anti-infectives (From admission, onward)    Start     Dose/Rate Route Frequency Ordered Stop   10/18/21 1000  fluconazole (DIFLUCAN) IVPB 400 mg        400 mg 100 mL/hr over 120 Minutes Intravenous Every 24 hours 10/18/21 0740     10/16/21 2200   piperacillin-tazobactam (ZOSYN) IVPB 3.375 g        3.375 g 12.5 mL/hr over 240 Minutes Intravenous Every 8 hours 10/16/21 1524 10/23/21 2159   10/16/21 1500  piperacillin-tazobactam (ZOSYN) IVPB 3.375 g        3.375 g 100 mL/hr over 30 Minutes Intravenous  Once 10/16/21 1455 10/16/21 1552       Medications: Scheduled Meds:  acetaminophen  650 mg Oral Q6H   amiodarone  400 mg Oral BID   Chlorhexidine Gluconate Cloth  6 each Topical Daily   levothyroxine  25 mcg Oral Q0600   loratadine  10 mg Oral Daily   mouth rinse  15 mL Mouth Rinse BID   metoCLOPramide (REGLAN) injection  10 mg Intravenous Q6H   pantoprazole  40 mg Oral Daily   vortioxetine HBr  10 mg Oral Daily   Continuous Infusions:  sodium chloride Stopped (10/18/21 0522)   fluconazole (DIFLUCAN) IV Stopped (10/18/21 1127)   heparin 1,750 Units/hr (10/19/21 0759)   methocarbamol (ROBAXIN) IV 500 mg (10/17/21 0043)   piperacillin-tazobactam (ZOSYN)  IV Stopped (10/19/21 0211)   PRN Meds:.sodium chloride, HYDROmorphone (DILAUDID) injection, methocarbamol (ROBAXIN) IV, morphine, ondansetron (ZOFRAN) IV, prochlorperazine, simethicone  Assessment/Plan: Julie Williams is a 56 year old female s/p open small bowel resection, primary closure of epigastric hernia and 1 hour lysis of  adhesions on 10/09/21.  She presented 2/21 with concern for an anastomotic leak on imaging and two left sided IR drains were placed.  CLD - see if drainage character changes with cranberry and other colors - having gas and bowel movements JP drain studies consistent with anastomotic leak JP output decreased and midline wound output increased overnight - unfortunately this leak may mature into a fistula.  Hopefully it will heal with time.  Place ostomy appliance on drainage to help protect skin.  Check CT with PO and IV contrast to evaluate for undrained fluid collections and evaluate exact location and amount of leakage.   Incentive spirometer - low grade  fevers may be due to atelectasis.  Will also check CT chest today due to read of chest x-ray New onset a. Fib with RVR - cardiology evaluation appreciated, on amiodarone ggt, on heparin ggt.  Transition to eliquis as soon as safe - depending on CT scan results AKI - improved since recent hospital stay Hypokalemia - K pending this AM Pain/nausea control Increase activity Ostomy appliance for inferior aspect of incision.   LOS: 3 days   FEN: Clear liquids, pending CT results ID: Zosyn, fluconazole f/u final culture results VTE: heparin ggt, scds Foley: None Dispo: Continued care in stepdown    Julie Morn, MD  [facility] Use [email or web address] to contact on call provider  Daily Billing:

## 2021-10-19 NOTE — TOC Benefit Eligibility Note (Signed)
Patient Advocate Encounter ° °Insurance verification completed.   ° °The patient is currently admitted and upon discharge could be taking Eliquis 5 mg. ° °The current 30 day co-pay is, $0.00.  ° °The patient is currently admitted and upon discharge could be taking Xarelto 20 mg. ° °The current 30 day co-pay is, $0.00.  ° °The patient is insured through [employer] Medicare Part D  ° ° ° °Refugia Laneve, CPhT °Pharmacy Patient Advocate Specialist °[facility] Patient Advocate Team °Direct Number: [phone]  Fax: [phone] ° ° ° ° ° °  °

## 2021-10-19 NOTE — Progress Notes (Addendum)
Progress Note  Patient Name: Julie Williams Date of Encounter: 10/19/2021  [facility] Cardiologist: Shirlee More, MD   Subjective   No cardiac awareness of atrial fibrillation. Denies any CP or SOB. Appears to be calm this morning, but per nurse was crying a hour again wanting to leave again.  Inpatient Medications    Scheduled Meds:  acetaminophen  650 mg Oral Q6H   Chlorhexidine Gluconate Cloth  6 each Topical Daily   levothyroxine  25 mcg Oral Q0600   loratadine  10 mg Oral Daily   mouth rinse  15 mL Mouth Rinse BID   metoCLOPramide (REGLAN) injection  10 mg Intravenous Q6H   pantoprazole  40 mg Oral Daily   vortioxetine HBr  10 mg Oral Daily   Continuous Infusions:  sodium chloride Stopped (10/18/21 0522)   amiodarone 30 mg/hr (10/19/21 0528)   fluconazole (DIFLUCAN) IV Stopped (10/18/21 1127)   heparin 1,400 Units/hr (10/19/21 0549)   methocarbamol (ROBAXIN) IV 500 mg (10/17/21 0043)   piperacillin-tazobactam (ZOSYN)  IV Stopped (10/19/21 0211)   PRN Meds: sodium chloride, HYDROmorphone (DILAUDID) injection, methocarbamol (ROBAXIN) IV, morphine, ondansetron (ZOFRAN) IV, prochlorperazine, simethicone   Vital Signs    Vitals:   10/19/21 0400 10/19/21 0500 10/19/21 0600 10/19/21 0700  BP: 108/68 105/62 (!) 68/47 98/62  Pulse: 86 85 80 81  Resp: 13 12 13 13   Temp: (!) 100.6 F (38.1 C)     TempSrc: Axillary     SpO2: 94% 95% 95% 95%  Weight:      Height:        Intake/Output Summary (Last 24 hours) at 10/19/2021 0749 Last data filed at 10/19/2021 2876 Gross per 24 hour  Intake 1089.51 ml  Output 225 ml  Net 864.51 ml    Last 3 Weights 10/17/2021 10/09/2021 10/04/2021  Weight (lbs) 147 lb 4.3 oz 128 lb 135 lb 3.2 oz  Weight (kg) 66.8 kg 58.06 kg 61.326 kg  Some encounter information is confidential and restricted. Go to Review Flowsheets activity to see all data.      Telemetry    Patient converted to NSR around 11:10AM on 10/18/2021- Personally  Reviewed  ECG    EKG 10/17/21 afib with RVR. No EKG on 2/21 which was the day of admission, no EKG from recent admission on 2/16 either. Last EKG prior to this admission in Sept 2022 showed sinus tach - Personally Reviewed  Physical Exam   GEN: No acute distress.   Neck: No JVD Cardiac: RRR, no murmurs, rubs, or gallops.  Respiratory: Clear to auscultation bilaterally. GI: covered with dressing and 2 drains in place.  MS: No edema; No deformity. Neuro:  Nonfocal  Psych: Normal affect   Labs    High Sensitivity Troponin:  No results for input(s): TROPONINIHS in the last 720 hours.   Chemistry Recent Labs  Lab 10/16/21 1230 10/17/21 0007 10/18/21 0239 10/18/21 2348  NA 135 133* 135 135  K 3.5 3.5 2.9* 3.1*  CL 102 103 107 106  CO2 24 21* 20* 21*  GLUCOSE 89 89 110* 83  BUN 22* 19 16 13   CREATININE 1.23* 1.29* 1.20* 1.18*  CALCIUM 8.6* 7.7* 7.8* 7.7*  MG  --   --  1.6*  --   PROT 6.9  --   --   --   ALBUMIN 2.7*  --   --   --   AST 22  --   --   --   ALT 14  --   --   --  ALKPHOS 74  --   --   --   BILITOT 0.8  --   --   --   GFRNONAA 51* 48* 53* 54*  ANIONGAP 9 9 8 8      Lipids No results for input(s): CHOL, TRIG, HDL, LABVLDL, LDLCALC, CHOLHDL in the last 168 hours.  Hematology Recent Labs  Lab 10/17/21 0007 10/18/21 0239 10/18/21 2348  WBC 24.0* 16.0* 14.0*  RBC 3.06* 2.89* 2.98*  HGB 10.1* 9.4* 9.7*  HCT 30.6* 28.8* 29.7*  MCV 100.0 99.7 99.7  MCH 33.0 32.5 32.6  MCHC 33.0 32.6 32.7  RDW 13.4 13.6 13.8  PLT 423* 398 403*    Thyroid  Recent Labs  Lab 10/17/21 1220  TSH 5.873*     BNPNo results for input(s): BNP, PROBNP in the last 168 hours.  DDimer No results for input(s): DDIMER in the last 168 hours.   Radiology    DG CHEST PORT 1 VIEW  Result Date: 10/17/2021 CLINICAL DATA:  Atrial fibrillation. Postop anastomotic leak for gastrointestinal surgery. EXAM: PORTABLE CHEST 1 VIEW COMPARISON:  10/16/2018 and chest CT of 10/16/2018 FINDINGS:  Cervical spine fixation. Midline trachea. Normal heart size. No pleural effusion or pneumothorax. Linear bibasilar airspace disease is decreased compared to the prior exam. Increased density projecting over the left apex. IMPRESSION: Mild bibasilar atelectasis. Increased density projecting over the left apex. This is at the site of pleuroparenchymal scarring back on 10/16/2018. Possibly secondary to osseous summation. Recommend nonemergent PA and lateral radiographs to exclude lung mass. These results will be called to the ordering clinician or representative by the Radiologist Assistant, and communication documented in the PACS or [employer]. Electronically Signed   By: Abigail Miyamoto M.D.   On: 10/17/2021 10:56   ECHOCARDIOGRAM COMPLETE  Result Date: 10/17/2021    ECHOCARDIOGRAM REPORT   Patient Name:   Julie Williams Date of Exam: 10/17/2021 Medical Rec #:  [id number]       Height:       65.0 in Accession #:    [id number]      Weight:       147.3 lb Date of Birth:  12-13-1965        BSA:          1.737 m Patient Age:    56 years        BP:           95/65 mmHg Patient Gender: F               HR:           143 bpm. Exam Location:  Inpatient Procedure: 2D Echo, Cardiac Doppler and Color Doppler Indications:    I48.91 AFIB  History:        Patient has no prior history of Echocardiogram examinations.                 Risk Factors:Hypertension. ACUTE RESP. FAILURE W HYPOXIA.  Sonographer:    Beryle Beams Referring Phys: 2 RHONDA G BARRETT  Sonographer Comments: No subcostal window. POST OP ABDOMEN S/P IMPRESSIONS  1. Left ventricular ejection fraction, by estimation, is 60 to 65%. The left ventricle has normal function. The left ventricle has no regional wall motion abnormalities. Left ventricular diastolic parameters are indeterminate.  2. Right ventricular systolic function is normal. The right ventricular size is normal. There is normal pulmonary artery systolic pressure. The estimated right ventricular  systolic pressure is 06.3 mmHg.  3. The mitral valve is normal  in structure. Mild mitral valve regurgitation. No evidence of mitral stenosis.  4. Tricuspid valve regurgitation is mild to moderate.  5. The aortic valve is normal in structure. Aortic valve regurgitation is not visualized. No aortic stenosis is present.  6. The inferior vena cava is normal in size with greater than 50% respiratory variability, suggesting right atrial pressure of 3 mmHg. FINDINGS  Left Ventricle: Left ventricular ejection fraction, by estimation, is 60 to 65%. The left ventricle has normal function. The left ventricle has no regional wall motion abnormalities. The left ventricular internal cavity size was normal in size. There is  no left ventricular hypertrophy. Left ventricular diastolic parameters are indeterminate. Right Ventricle: The right ventricular size is normal. No increase in right ventricular wall thickness. Right ventricular systolic function is normal. There is normal pulmonary artery systolic pressure. The tricuspid regurgitant velocity is 2.17 m/s, and  with an assumed right atrial pressure of 3 mmHg, the estimated right ventricular systolic pressure is 78.9 mmHg. Left Atrium: Left atrial size was normal in size. Right Atrium: Right atrial size was normal in size. Pericardium: There is no evidence of pericardial effusion. Mitral Valve: The mitral valve is normal in structure. Mild mitral valve regurgitation. No evidence of mitral valve stenosis. Tricuspid Valve: The tricuspid valve is normal in structure. Tricuspid valve regurgitation is mild to moderate. No evidence of tricuspid stenosis. Aortic Valve: The aortic valve is normal in structure. Aortic valve regurgitation is not visualized. No aortic stenosis is present. Aortic valve mean gradient measures 4.0 mmHg. Aortic valve peak gradient measures 6.7 mmHg. Aortic valve area, by VTI measures 1.70 cm. Pulmonic Valve: The pulmonic valve was normal in structure.  Pulmonic valve regurgitation is not visualized. No evidence of pulmonic stenosis. Aorta: The aortic root is normal in size and structure. Venous: The inferior vena cava is normal in size with greater than 50% respiratory variability, suggesting right atrial pressure of 3 mmHg. IAS/Shunts: No atrial level shunt detected by color flow Doppler.  LEFT VENTRICLE PLAX 2D LVIDd:         3.60 cm LVIDs:         2.30 cm LV PW:         1.10 cm LV IVS:        1.00 cm LVOT diam:     1.70 cm LV SV:         38 LV SV Index:   22 LVOT Area:     2.27 cm  LV Volumes (MOD) LV vol d, MOD A2C: 63.6 ml LV vol d, MOD A4C: 63.4 ml LV vol s, MOD A2C: 26.2 ml LV vol s, MOD A4C: 26.6 ml LV SV MOD A2C:     37.4 ml LV SV MOD A4C:     63.4 ml LV SV MOD BP:      38.0 ml RIGHT VENTRICLE RV Basal diam:  3.30 cm RV Mid diam:    2.90 cm RV S prime:     13.60 cm/s TAPSE (M-mode): 1.2 cm LEFT ATRIUM             Index        RIGHT ATRIUM           Index LA diam:        3.80 cm 2.19 cm/m   RA Area:     12.70 cm LA Vol (A2C):   54.0 ml 31.09 ml/m  RA Volume:   28.60 ml  16.47 ml/m LA Vol (A4C):   49.7 ml  28.61 ml/m LA Biplane Vol: 52.5 ml 30.23 ml/m  AORTIC VALVE                    PULMONIC VALVE AV Area (Vmax):    2.18 cm     PV Vmax:       0.72 m/s AV Area (Vmean):   1.93 cm     PV Vmean:      46.000 cm/s AV Area (VTI):     1.70 cm     PV VTI:        0.118 m AV Vmax:           129.00 cm/s  PV Peak grad:  2.1 mmHg AV Vmean:          93.100 cm/s  PV Mean grad:  1.0 mmHg AV VTI:            0.222 m AV Peak Grad:      6.7 mmHg AV Mean Grad:      4.0 mmHg LVOT Vmax:         124.00 cm/s LVOT Vmean:        79.200 cm/s LVOT VTI:          0.166 m LVOT/AV VTI ratio: 0.75  AORTA Ao Root diam: 2.70 cm Ao Asc diam:  3.10 cm TRICUSPID VALVE TR Peak grad:   18.8 mmHg TR Mean grad:   14.0 mmHg TR Vmax:        217.00 cm/s TR Vmean:       180.0 cm/s  SHUNTS Systemic VTI:  0.17 m Systemic Diam: 1.70 cm Candee Furbish MD Electronically signed by Candee Furbish MD  Signature Date/Time: 10/17/2021/3:29:58 PM    Final     Cardiac Studies   Echo 10/17/2021 1. Left ventricular ejection fraction, by estimation, is 60 to 65%. The  left ventricle has normal function. The left ventricle has no regional  wall motion abnormalities. Left ventricular diastolic parameters are  indeterminate.   2. Right ventricular systolic function is normal. The right ventricular  size is normal. There is normal pulmonary artery systolic pressure. The  estimated right ventricular systolic pressure is 16.1 mmHg.   3. The mitral valve is normal in structure. Mild mitral valve  regurgitation. No evidence of mitral stenosis.   4. Tricuspid valve regurgitation is mild to moderate.   5. The aortic valve is normal in structure. Aortic valve regurgitation is  not visualized. No aortic stenosis is present.   6. The inferior vena cava is normal in size with greater than 50%  respiratory variability, suggesting right atrial pressure of 3 mmHg.   Patient Profile     56 y.o. female with PMH of HTN, tobacco use, perforated colon/rectum with multiple complications, chronic pain issues who is seeing for afib. She recently underwent exploratory laparotomy, lysis of adhesions, excision of small bowel mass, and primary closure of epigastric incisional hernia with open small bowel resection for perforated jejunum on 10/09/2021.She came to the emergency room on 2/21 with bloating and drainage from the lower aspect of her incision and found to have anastomotic leak on CT. She was treated for antibiotics and intra-abdominal drain placement by IR. EKG obtained on hospital day #2 showed rapid afib. Converted back to NSR on IV amiodarone on 2/23.   Assessment & Plan    Atrial fibrillation with RVR  - received 3 doses of IV lopressor on 2/22, SBP went down to the 80s. IV amiodarone was added on 2/22 for better  rate control. IV heparin started.   - Echo 10/17/2021 EF 60-65%, mild MR, mild to moderate TR  -  No cardiac awareness. SBP drop down to 60s around 6 AM, but that one is likely spurious due to movement as patient was asymptomatic and there was no documentation by night time nurse. Overall, her SBP stable in the high 90s - 100s. Run another fever last night. Still on IV abx.   - converted on IV amiodarone to NSR around 11AM on 2/23, currently in NSR  - will convert IV amiodarone to PO, 400mg  BID for 1 week, 200mg  BID for 1 month, then 200mg  daily thereafter. Would like to switch IV heparin to Eliquis 5mg  BID if ok by surgery  Recent AKI: Cr high at 3.31 during recent admission on 2/16, improved to 1.23 at the beginning of this admission on 2/21. Now Cr 1.18  Recent open small bowel resection with primary closure of epigastric hernia and lysis of adhesion on 2/14 with anastomotic leak on imaging on 2/21 s/p 2 left-sided drain by IR  Sepsis with recent intra-abdominal abscess: fever of 100.6 on 2/22. Receiving IV abx. Recurrent fever of 100.6 again last night.   Hypokalemia: K improved from 2.9 to 3.1 after receiving 4 rounds of 10 meq IV KCl yesterday. Per primary team    Initially patient wished to leave AMA on 2/22 as she wanted to go home to take care of her chicken and dog. But she is clearly too ill to manage her condition at home. She eventually agreed to stay. Patient again wanted to leave in the morning of 2/24 to take care of her animals, when questioned by nurse, "?suicidal ideation", primary team to decide if psych consult needed.    For questions or updates, please contact [place] Please consult [email or web address] for contact info under        Signed, Almyra Deforest, [place]  10/19/2021, 7:49 AM    Personally seen and examined. Agree with APP above with the following comments: Patient notes no palpitations or SOB.  Was calm this morning but HOH.   Exam notable for regular rhythm, minimal drainage.  She was not  Personally reviewed relevant tests; She converted to SR yesterday, BP has  rebounded and the 600 reading is an asymptomatic outlier Would recommend - continuing PO amiodarone load; she will then get 200 mg PO at DC - discussed with pharmacy who is assisting with price check for optimal new DOAC - DOAC is pending on if CT looks good (see surgery note from this AM)  Potential 10/20/21 SO once DOAC has been resolved  Rudean Haskell, MD Cardiologist [facility]  [address] [place], [place] [address] [phone]  12:02 PM

## 2021-10-20 ENCOUNTER — Inpatient Hospital Stay (HOSPITAL_COMMUNITY): Payer: Medicare Other

## 2021-10-20 ENCOUNTER — Encounter (HOSPITAL_COMMUNITY): Payer: Self-pay | Admitting: Surgery

## 2021-10-20 ENCOUNTER — Inpatient Hospital Stay: Payer: Self-pay

## 2021-10-20 LAB — LIPASE, FLUID
Lipase-Fluid: 374 U/L
Lipase-Fluid: 18585 U/L

## 2021-10-20 LAB — BASIC METABOLIC PANEL
Anion gap: 8 (ref 5–15)
BUN: 9 mg/dL (ref 6–20)
CO2: 21 mmol/L — ABNORMAL LOW (ref 22–32)
Calcium: 7.7 mg/dL — ABNORMAL LOW (ref 8.9–10.3)
Chloride: 104 mmol/L (ref 98–111)
Creatinine, Ser: 1.16 mg/dL — ABNORMAL HIGH (ref 0.44–1.00)
GFR, Estimated: 55 mL/min — ABNORMAL LOW (ref >60)
Glucose, Bld: 91 mg/dL (ref 70–99)
Potassium: 3 mmol/L — ABNORMAL LOW (ref 3.5–5.1)
Sodium: 133 mmol/L — ABNORMAL LOW (ref 135–145)

## 2021-10-20 LAB — CBC
HCT: 28.4 % — ABNORMAL LOW (ref 36.0–46.0)
Hemoglobin: 9.4 g/dL — ABNORMAL LOW (ref 12.0–15.0)
MCH: 32.8 pg (ref 26.0–34.0)
MCHC: 33.1 g/dL (ref 30.0–36.0)
MCV: 99 fL (ref 80.0–100.0)
Platelets: 467 10*3/uL — ABNORMAL HIGH (ref 150–400)
RBC: 2.87 MIL/uL — ABNORMAL LOW (ref 3.87–5.11)
RDW: 14 % (ref 11.5–15.5)
WBC: 9.6 10*3/uL (ref 4.0–10.5)
nRBC: 0 % (ref 0.0–0.2)

## 2021-10-20 LAB — BRAIN NATRIURETIC PEPTIDE: B Natriuretic Peptide: 840.2 pg/mL — ABNORMAL HIGH (ref 0.0–100.0)

## 2021-10-20 MED ORDER — METRONIDAZOLE 500 MG/100ML IV SOLN
500.0000 mg | Freq: Two times a day (BID) | INTRAVENOUS | Status: DC
  Administered 2021-10-20 – 2021-10-23 (×7): 500 mg via INTRAVENOUS
  Filled 2021-10-20 (×7): qty 100

## 2021-10-20 MED ORDER — SODIUM CHLORIDE 0.9% FLUSH: 10.0000 mL | INTRAVENOUS | Status: DC | PRN

## 2021-10-20 MED ORDER — KCL IN DEXTROSE-NACL 20-5-0.45 MEQ/L-%-% IV SOLN
INTRAVENOUS | Status: AC
  Filled 2021-10-20 (×3): qty 1000

## 2021-10-20 MED ORDER — SODIUM CHLORIDE 0.9 % IV SOLN: INTRAVENOUS | Status: DC | PRN

## 2021-10-20 MED ORDER — SODIUM CHLORIDE 0.9% FLUSH
10.0000 mL | Freq: Two times a day (BID) | INTRAVENOUS | Status: DC
  Administered 2021-10-20: 10 mL
  Administered 2021-10-21: 20 mL
  Administered 2021-10-21 – 2021-10-23 (×4): 10 mL

## 2021-10-20 MED ORDER — CEFEPIME HCL 2 G IJ SOLR
2.0000 g | Freq: Two times a day (BID) | INTRAMUSCULAR | Status: DC
Start: 2021-10-20 — End: 2021-10-23
  Administered 2021-10-20 – 2021-10-22 (×6): 2 g via INTRAVENOUS
  Filled 2021-10-20 (×6): qty 2

## 2021-10-20 NOTE — Progress Notes (Signed)
Assessment & Plan: POD#11 - s/p open small bowel resection, primary closure of epigastric hernia, lysis of adhesions  Anastomotic leak - placement of two drains by IR   FLD tolerated, having BM's JP output decreased and midline wound output increased; placed ostomy appliance CT scan 2/24 with persistent fluid collection, ongoing leakage - will review with IR   New onset a. Fib with RVR  cardiology evaluation appreciated, on amiodarone ggt, on heparin ggt. AKI - improved since recent hospital stay Hypokalemia - potassium 3.0 this AM, will replete Pain/nausea control Increase activity Ostomy appliance for inferior aspect of incision.   FEN: full liquid diet ID: Zosyn, fluconazole f/u final culture results VTE: heparin ggt, scds Foley: None Dispo: Continued care in stepdown        Julie Gemma, MD       [facility]       Office: [phone]   Chief Complaint: Anastomotic leak following small bowel resection  Subjective: Patient in bed, some pain.  Tolerating full liquid diet.  Concerned over wound drainage.  Objective: Vital signs in last 24 hours: Temp:  [98.2 F (36.8 C)-99.9 F (37.7 C)] 98.2 F (36.8 C) (02/25 0400) Pulse Rate:  [89-113] 95 (02/25 0500) Resp:  [13-21] 18 (02/25 0500) BP: (96-117)/(64-86) 117/71 (02/25 0500) SpO2:  [88 %-98 %] 98 % (02/25 0500) Last BM Date : 10/19/21  Intake/Output from previous day: 02/24 0701 - 02/25 0700 In: 1778.3 [P.O.:480; I.V.:583.9; IV Piggyback:409.4] Out: 1175 [Urine:1175] Intake/Output this shift: No intake/output data recorded.  Physical Exam: HEENT - sclerae clear, mucous membranes moist Neck - soft Abdomen - mild distension; thin succus from inferior midline wound; drains with minimal serous in bags Ext - mild edema bilat lower extremities Neuro - alert & oriented, no focal deficits  Lab Results:  Recent Labs    10/19/21 1602 10/20/21 0745  WBC 11.6* 9.6  HGB 9.6* 9.4*  HCT 29.6*  28.4*  PLT 466* 467*   BMET Recent Labs    10/19/21 1602 10/20/21 0745  NA 133* 133*  K 3.3* 3.0*  CL 103 104  CO2 22 21*  GLUCOSE 86 91  BUN 12 9  CREATININE 1.10* 1.16*  CALCIUM 7.7* 7.7*   PT/INR No results for input(s): LABPROT, INR in the last 72 hours. Comprehensive Metabolic Panel:    Component Value Date/Time   NA 133 (L) 10/20/2021 0745   NA 133 (L) 10/19/2021 1602   NA 139 10/24/2020 1522   NA 137 08/29/2020 1436   K 3.0 (L) 10/20/2021 0745   K 3.3 (L) 10/19/2021 1602   CL 104 10/20/2021 0745   CL 103 10/19/2021 1602   CO2 21 (L) 10/20/2021 0745   CO2 22 10/19/2021 1602   BUN 9 10/20/2021 0745   BUN 12 10/19/2021 1602   BUN 13 10/24/2020 1522   BUN 14 08/29/2020 1436   CREATININE 1.16 (H) 10/20/2021 0745   CREATININE 1.10 (H) 10/19/2021 1602   CREATININE 0.80 10/29/2018 1556   CREATININE 0.72 04/28/2014 1201   GLUCOSE 91 10/20/2021 0745   GLUCOSE 86 10/19/2021 1602   CALCIUM 7.7 (L) 10/20/2021 0745   CALCIUM 7.7 (L) 10/19/2021 1602   AST 22 10/16/2021 1230   AST 32 05/27/2019 1218   ALT 14 10/16/2021 1230   ALT 31 05/27/2019 1218   ALKPHOS 74 10/16/2021 1230   ALKPHOS 102 05/27/2019 1218   BILITOT 0.8 10/16/2021 1230   BILITOT 0.8 05/27/2019 1218   PROT 6.9 10/16/2021  1230   PROT 7.8 05/27/2019 1218   ALBUMIN 2.7 (L) 10/16/2021 1230   ALBUMIN 4.4 05/27/2019 1218    Studies/Results: CT CHEST ABDOMEN PELVIS W CONTRAST  Result Date: 10/19/2021 CLINICAL DATA:  Status post open small-bowel resection. Primary closure of epigastric hernia and lysis of adhesions on 10/09/2021. Anastomotic leak on 10/16/2021. Two left-sided interventional radiology drains placed. Evaluate chest for abnormal chest radiographic findings. EXAM: CT CHEST, ABDOMEN, AND PELVIS WITH CONTRAST TECHNIQUE: Multidetector CT imaging of the chest, abdomen and pelvis was performed following the standard protocol during bolus administration of intravenous contrast. RADIATION DOSE  REDUCTION: This exam was performed according to the departmental dose-optimization program which includes automated exposure control, adjustment of the mA and/or kV according to patient size and/or use of iterative reconstruction technique. CONTRAST:  13mL OMNIPAQUE IOHEXOL 300 MG/ML  SOLN COMPARISON:  Chest radiograph of 10/17/2021. Abdominopelvic CT of 10/16/2021. FINDINGS: CT CHEST FINDINGS Cardiovascular: Aortic atherosclerosis. Tortuous thoracic aorta. Mild cardiomegaly, without pericardial effusion. No central pulmonary embolism, on this non-dedicated study. Pulmonary artery enlargement, outflow tract 3.1 cm Mediastinum/Nodes: Precarinal node measures 1.4 cm in 25/2 and is significantly enlarged from 2020. Prevascular nodes up to 11 mm in 25/2, also significantly enlarged since the prior. No hilar adenopathy. Minimal extraluminal gas tracks along the esophagus from the upper abdomen. Lungs/Pleura: Small bilateral pleural effusions, including fluid tracking in the right major fissure. Mild centrilobular emphysema. Smooth septal thickening, most significant in the apices, suggests interstitial edema. Bibasilar subsegmental atelectasis. Dependent left apical opacity is increased compared to 10/16/2018, including on images 30 and 36 of series 6. Musculoskeletal: No acute osseous abnormality. Lower cervical spine fixation. CT ABDOMEN PELVIS FINDINGS Hepatobiliary: Hepatic steatosis. Prominent lateral segment left liver lobe. No focal liver lesion. Normal gallbladder, without biliary ductal dilatation. Pancreas: Artifact degradation within the upper abdomen from percutaneous catheters. Grossly normal pancreas. Spleen: Normal in size, without focal abnormality. Adrenals/Urinary Tract: Normal adrenal glands. Normal kidneys, without hydronephrosis. Normal urinary bladder. Stomach/Bowel: Proximal gastric underdistention. Surgical sutures in the rectum. Normal appendix. At least 1 enterotomy site. The more cephalad  left-sided percutaneous drain traverses a contrast and gas collection on the order of 10.1 x 2.9 cm in 73/2. This is the site of a approximately 10.9 x 5.5 cm primarily gas collection on the prior exam. The more caudal and anterior percutaneous drain terminates in a somewhat ill-defined small-bowel mesenteric collection of 6.2 x 2.8 cm on 84/2. Compare 8.2 x 3.8 cm on the prior exam (when remeasured). Just caudal to this, an interloop fluid and gas collection, which may be separate from the catheter measures 8.9 x 5.3 cm 92/2 versus 9.2 by 4.9 cm on the prior exam (when remeasured). A lateral left abdominal somewhat ill-defined fluid collection with peripheral enhancement measures 9.0 x 3.0 cm and 85/2 and is more well-defined today but relatively similar in size to the prior exam. This tracks caudally along the left iliacus muscle including 90/2. Extraluminal contrast is also identified throughout the lower left abdomen and pelvis, including bone 98/2. Small right lower abdominal fluid collection of 1.9 x 4.5 cm on 85/2 is grossly similar to on the prior. Extraluminal gas and fluid both within the intraperitoneal abdomen and anterior abdominal wall. Vascular/Lymphatic: Aortic atherosclerosis. No abdominopelvic adenopathy. Reproductive: Normal uterus. Prominent gonadal veins as can be seen with pelvic congestion syndrome. Other: Anasarca. Musculoskeletal: No acute osseous abnormality. IMPRESSION: 1. Placement of 2 left-sided percutaneous drain since 10/16/2021. Multiple residual fluid collections, as detailed above. Extensive extraluminal gas and  contrast. Although this could all be secondary to prior anastomotic leak 3 days ago, ongoing anastomotic leak (especially at the site of the more cephalad, lateral percutaneous drain) remains a concern. 2. No bowel obstruction. 3. Findings of congestive heart failure, including bilateral pleural effusions and septal thickening. 4. The plain film abnormality corresponds to  increased progressive dependent left apical pulmonary opacity, favored to represent progressive pleuroparenchymal scarring since 2020. Consider chest CT follow-up in 3 months to confirm stability and exclude less likely developing mass. 5. Thoracic adenopathy which is likely related to fluid overload/congestive heart failure. This could also be re-evaluated on follow-up chest CT. 6. Hepatic steatosis. Lateral segment left liver lobe prominence, which can be seen in mild cirrhosis. Correlate with risk factors. 7. Aortic atherosclerosis (ICD10-I70.0) and emphysema (ICD10-J43.9). Electronically Signed   By: Abigail Miyamoto M.D.   On: 10/19/2021 14:22      Julie Williams 10/20/2021   Patient ID: Julie Williams, female   DOB: 04-23-1966, 56 y.o.   MRN: [id number]

## 2021-10-20 NOTE — Progress Notes (Signed)
Peripherally Inserted Central Catheter Placement  The IV Nurse has discussed with the patient and/or persons authorized to consent for the patient, the purpose of this procedure and the potential benefits and risks involved with this procedure.  The benefits include less needle sticks, lab draws from the catheter, and the patient may be discharged home with the catheter. Risks include, but not limited to, infection, bleeding, blood clot (thrombus formation), and puncture of an artery; nerve damage and irregular heartbeat and possibility to perform a PICC exchange if needed/ordered by physician.  Alternatives to this procedure were also discussed.  Bard Power PICC patient education guide, fact sheet on infection prevention and patient information card has been provided to patient /or left at bedside.   Pt very anxious. Continued moving the sterile drape off face with continual reminders to stay under the sheet for sterility purposes.  PICC Placement Documentation  PICC Double Lumen 60/67/70 Right Basilic 37 cm 0 cm (Active)  Indication for Insertion or Continuance of Line Administration of hyperosmolar/irritating solutions (i.e. TPN, Vancomycin, etc.) 10/20/21 1351  Exposed Catheter (cm) 0 cm 10/20/21 1351  Site Assessment Clean, Dry, Intact 10/20/21 1351  Lumen #1 Status Flushed;Saline locked;Blood return noted 10/20/21 1351  Lumen #2 Status Flushed;Saline locked;Blood return noted 10/20/21 1351  Dressing Type Securing device;Transparent 10/20/21 1351  Dressing Status Antimicrobial disc in place 10/20/21 1351  Safety Lock Not Applicable 34/03/52 4818  Line Care Connections checked and tightened 10/20/21 1351  Line Adjustment (NICU/IV Team Only) No 10/20/21 1351  Dressing Intervention New dressing;Securement device changed;Antimicrobial disc changed 10/20/21 1351  Dressing Change Due 10/27/21 10/20/21 1351       Quin Hoop 10/20/2021, 1:53 PM

## 2021-10-20 NOTE — Progress Notes (Signed)
° ° ° ° °  Patient seen and evaluated again this afternoon.  Patient is postop day #11 from a small bowel resection during a procedure for repair of hernia.  She has an anastomotic leak.  CT scan was performed yesterday afternoon.  I have reviewed that study with radiology and with interventional radiology.  There appears to be persistent leakage from the anastomosis.  We discussed the location of the 2 percutaneous drains which are currently in place.  It appears that 1 drain may need to be repositioned or removed.  There is also an additional fluid collection more inferior to these drains which may require an additional drain to be placed.  I have reviewed these findings and discussed these procedures with the patient this afternoon.  Also, I have requested that the patient discontinue her full liquid diet.  We will put her on sips of clear liquids for comfort.  This should help with decreasing the amount of leakage from the anastomosis.  In order to support her nutritional status, we have asked that a PICC line be placed and we will plan to begin TNA with the assistance of pharmacy.  I reviewed these plans with the patient.  While she is not happy with this turn of events, she expresses understanding and a willingness to proceed.  She will be able to discuss all of this in more detail with her surgeon, Dr. Louanna Raw, when he returns this coming week.  The patient's nurse was present in the room during this discussion.  She understands the plans for management.  [name], MD [facility] [place] [facility] Office: [phone]

## 2021-10-20 NOTE — Plan of Care (Signed)
°  Problem: Skin Integrity: Goal: Risk for impaired skin integrity will decrease Outcome: Progressing   Problem: Education: Goal: Knowledge of General Education information will improve Description: Including pain rating scale, medication(s)/side effects and non-pharmacologic comfort measures Outcome: Progressing   Problem: Safety: Goal: Ability to remain free from injury will improve Outcome: Progressing

## 2021-10-20 NOTE — Progress Notes (Signed)
Referring Physician(s): Stechschulte,P  Supervising Physician: Jacqulynn Cadet  Patient Status:  [facility] - In-pt  Chief Complaint:  F/U Drains  Brief History:  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  She is s/p open small bowel resection for perforated jejunum, primary closure of epigastric hernia and 1 hour lysis of adhesions on 10/09/21 by Dr.  Thermon Leyland.   Pathology was negative for a mass but a perforation of the small intestine contained to the wall of the jejunum and the mesentery  She presented the ED on 2/21 with complaints of bloating and drainage from the lower aspect of her incision.   CT scan was concerning for an anastomotic leak.  She underwent placement of two left sided IR drains were placed.  Her drains have had little output of the past couple of days.  CT scan was obtained to re-evaluate the collections= Placement of 2 left-sided percutaneous drain since 10/16/2021. Multiple residual fluid collections, as detailed above. Extensive extraluminal gas and contrast. Although this could all be secondary to prior anastomotic leak 3 days ago, ongoing anastomotic leak (especially at the site of the more cephalad, lateral percutaneous drain) remains a concern.  Images were reviewed by Dr. Kathlene Cote and Egan.  Both existing drains need to be manipulated/pulled back. She probably needs a third drain into the central abscess if there is a safe percutaneous window at time of scanning.  Subjective:  Sitting in bed. Quite frustrated at needing another procedure.  Allergies: Bee venom  Medications: Prior to Admission medications   Medication Sig Start Date End Date Taking? Authorizing Provider  amLODipine (NORVASC) 10 MG tablet Take 10 mg by mouth daily.   Yes [provider]  Biotin w/ Vitamins C & E (HAIR/SKIN/NAILS PO) Take 1 tablet by mouth daily.   Yes [provider]  Cholecalciferol  (VITAMIN D3) 250 MCG (10000 UT) TABS Take 10,000 mcg by mouth daily.   Yes [provider]  EPINEPHrine 0.3 mg/0.3 mL IJ SOAJ injection Inject 0.3 mg into the muscle as needed for anaphylaxis. 05/12/21  Yes Sponseller, Gypsy Balsam, PA-C  levothyroxine (SYNTHROID) 25 MCG tablet Take 1 tablet (25 mcg total) by mouth daily before breakfast. 05/08/21  Yes Aslam, Sadia, MD  morphine (MSIR) 30 MG tablet Take 1 tablet (30 mg total) by mouth every 6 (six) hours as needed for severe pain. Patient taking differently: Take 30 mg by mouth every 5 (five) hours as needed for severe pain. 04/24/21  Yes Aslam, Loralyn Freshwater, MD  naloxone (NARCAN) nasal spray 4 mg/0.1 mL 1 spray as needed (overdose). 05/22/21  Yes [provider]  omeprazole (PRILOSEC) 40 MG capsule TAKE 1 CAPSULE BY MOUTH EVERY DAY 09/28/20  Yes Katsadouros, Vasilios, MD  polyethylene glycol (MIRALAX / GLYCOLAX) 17 g packet Take 17 g by mouth daily. Patient taking differently: Take 17 g by mouth daily as needed for moderate constipation. 58/09/98  Yes Leighton Ruff, MD  vortioxetine HBr (TRINTELLIX) 10 MG TABS tablet Take 1 tablet (10 mg total) by mouth daily. 01/18/21  Yes Katsadouros, Vasilios, MD     Vital Signs: BP 131/82    Pulse 97    Temp 98.2 F (36.8 C) (Oral)    Resp (!) 24    Ht 5\' 5"  (1.651 m)    Wt 147 lb 4.3 oz (66.8 kg)    SpO2 93%    BMI 24.51 kg/m   Physical Exam Vitals reviewed.  Constitutional:  Appearance: Normal appearance.  Cardiovascular:     Rate and Rhythm: Normal rate.  Pulmonary:     Effort: Pulmonary effort is normal. No respiratory distress.  Abdominal:     Comments: Mild distension; thin succus from inferior midline wound; drains with minimal serous in bags  Neurological:     General: No focal deficit present.     Mental Status: She is alert and oriented to person, place, and time.  Psychiatric:        Mood and Affect: Mood normal.        Behavior: Behavior normal.        Thought Content: Thought  content normal.        Judgment: Judgment normal.  Drain Location: LUQ Size: Fr size: 12 Fr and 14 Fr Date of placement: 10/16/21  Currently to: Drain collection device: gravity 24 hour output:  Output by Drain (mL) 10/18/21 0701 - 10/18/21 1900 10/18/21 1901 - 10/19/21 0700 10/19/21 0701 - 10/19/21 1900 10/19/21 1901 - 10/20/21 0700 10/20/21 0701 - 10/20/21 1429  Closed System Drain 1 Bulb (JP) 12 Fr. 90   0   Closed System Drain 2 Lateral LLQ Bulb (JP) 14 Fr. 10   0     Imaging: CT CHEST ABDOMEN PELVIS W CONTRAST  Result Date: 10/19/2021 CLINICAL DATA:  Status post open small-bowel resection. Primary closure of epigastric hernia and lysis of adhesions on 10/09/2021. Anastomotic leak on 10/16/2021. Two left-sided interventional radiology drains placed. Evaluate chest for abnormal chest radiographic findings. EXAM: CT CHEST, ABDOMEN, AND PELVIS WITH CONTRAST TECHNIQUE: Multidetector CT imaging of the chest, abdomen and pelvis was performed following the standard protocol during bolus administration of intravenous contrast. RADIATION DOSE REDUCTION: This exam was performed according to the departmental dose-optimization program which includes automated exposure control, adjustment of the mA and/or kV according to patient size and/or use of iterative reconstruction technique. CONTRAST:  146mL OMNIPAQUE IOHEXOL 300 MG/ML  SOLN COMPARISON:  Chest radiograph of 10/17/2021. Abdominopelvic CT of 10/16/2021. FINDINGS: CT CHEST FINDINGS Cardiovascular: Aortic atherosclerosis. Tortuous thoracic aorta. Mild cardiomegaly, without pericardial effusion. No central pulmonary embolism, on this non-dedicated study. Pulmonary artery enlargement, outflow tract 3.1 cm Mediastinum/Nodes: Precarinal node measures 1.4 cm in 25/2 and is significantly enlarged from 2020. Prevascular nodes up to 11 mm in 25/2, also significantly enlarged since the prior. No hilar adenopathy. Minimal extraluminal gas tracks along the esophagus  from the upper abdomen. Lungs/Pleura: Small bilateral pleural effusions, including fluid tracking in the right major fissure. Mild centrilobular emphysema. Smooth septal thickening, most significant in the apices, suggests interstitial edema. Bibasilar subsegmental atelectasis. Dependent left apical opacity is increased compared to 10/16/2018, including on images 30 and 36 of series 6. Musculoskeletal: No acute osseous abnormality. Lower cervical spine fixation. CT ABDOMEN PELVIS FINDINGS Hepatobiliary: Hepatic steatosis. Prominent lateral segment left liver lobe. No focal liver lesion. Normal gallbladder, without biliary ductal dilatation. Pancreas: Artifact degradation within the upper abdomen from percutaneous catheters. Grossly normal pancreas. Spleen: Normal in size, without focal abnormality. Adrenals/Urinary Tract: Normal adrenal glands. Normal kidneys, without hydronephrosis. Normal urinary bladder. Stomach/Bowel: Proximal gastric underdistention. Surgical sutures in the rectum. Normal appendix. At least 1 enterotomy site. The more cephalad left-sided percutaneous drain traverses a contrast and gas collection on the order of 10.1 x 2.9 cm in 73/2. This is the site of a approximately 10.9 x 5.5 cm primarily gas collection on the prior exam. The more caudal and anterior percutaneous drain terminates in a somewhat ill-defined small-bowel mesenteric collection of 6.2  x 2.8 cm on 84/2. Compare 8.2 x 3.8 cm on the prior exam (when remeasured). Just caudal to this, an interloop fluid and gas collection, which may be separate from the catheter measures 8.9 x 5.3 cm 92/2 versus 9.2 by 4.9 cm on the prior exam (when remeasured). A lateral left abdominal somewhat ill-defined fluid collection with peripheral enhancement measures 9.0 x 3.0 cm and 85/2 and is more well-defined today but relatively similar in size to the prior exam. This tracks caudally along the left iliacus muscle including 90/2. Extraluminal contrast is  also identified throughout the lower left abdomen and pelvis, including bone 98/2. Small right lower abdominal fluid collection of 1.9 x 4.5 cm on 85/2 is grossly similar to on the prior. Extraluminal gas and fluid both within the intraperitoneal abdomen and anterior abdominal wall. Vascular/Lymphatic: Aortic atherosclerosis. No abdominopelvic adenopathy. Reproductive: Normal uterus. Prominent gonadal veins as can be seen with pelvic congestion syndrome. Other: Anasarca. Musculoskeletal: No acute osseous abnormality. IMPRESSION: 1. Placement of 2 left-sided percutaneous drain since 10/16/2021. Multiple residual fluid collections, as detailed above. Extensive extraluminal gas and contrast. Although this could all be secondary to prior anastomotic leak 3 days ago, ongoing anastomotic leak (especially at the site of the more cephalad, lateral percutaneous drain) remains a concern. 2. No bowel obstruction. 3. Findings of congestive heart failure, including bilateral pleural effusions and septal thickening. 4. The plain film abnormality corresponds to increased progressive dependent left apical pulmonary opacity, favored to represent progressive pleuroparenchymal scarring since 2020. Consider chest CT follow-up in 3 months to confirm stability and exclude less likely developing mass. 5. Thoracic adenopathy which is likely related to fluid overload/congestive heart failure. This could also be re-evaluated on follow-up chest CT. 6. Hepatic steatosis. Lateral segment left liver lobe prominence, which can be seen in mild cirrhosis. Correlate with risk factors. 7. Aortic atherosclerosis (ICD10-I70.0) and emphysema (ICD10-J43.9). Electronically Signed   By: Abigail Miyamoto M.D.   On: 10/19/2021 14:22   DG CHEST PORT 1 VIEW  Result Date: 10/17/2021 CLINICAL DATA:  Atrial fibrillation. Postop anastomotic leak for gastrointestinal surgery. EXAM: PORTABLE CHEST 1 VIEW COMPARISON:  10/16/2018 and chest CT of 10/16/2018 FINDINGS:  Cervical spine fixation. Midline trachea. Normal heart size. No pleural effusion or pneumothorax. Linear bibasilar airspace disease is decreased compared to the prior exam. Increased density projecting over the left apex. IMPRESSION: Mild bibasilar atelectasis. Increased density projecting over the left apex. This is at the site of pleuroparenchymal scarring back on 10/16/2018. Possibly secondary to osseous summation. Recommend nonemergent PA and lateral radiographs to exclude lung mass. These results will be called to the ordering clinician or representative by the Radiologist Assistant, and communication documented in the PACS or [employer]. Electronically Signed   By: Abigail Miyamoto M.D.   On: 10/17/2021 10:56   ECHOCARDIOGRAM COMPLETE  Result Date: 10/17/2021    ECHOCARDIOGRAM REPORT   Patient Name:   LATRISA HELLUMS Date of Exam: 10/17/2021 Medical Rec #:  [id number]       Height:       65.0 in Accession #:    [id number]      Weight:       147.3 lb Date of Birth:  1966-05-15        BSA:          1.737 m Patient Age:    21 years        BP:           95/65 mmHg Patient Gender: F  HR:           143 bpm. Exam Location:  Inpatient Procedure: 2D Echo, Cardiac Doppler and Color Doppler Indications:    I48.91 AFIB  History:        Patient has no prior history of Echocardiogram examinations.                 Risk Factors:Hypertension. ACUTE RESP. FAILURE W HYPOXIA.  Sonographer:    Beryle Beams Referring Phys: 55 RHONDA G BARRETT  Sonographer Comments: No subcostal window. POST OP ABDOMEN S/P IMPRESSIONS  1. Left ventricular ejection fraction, by estimation, is 60 to 65%. The left ventricle has normal function. The left ventricle has no regional wall motion abnormalities. Left ventricular diastolic parameters are indeterminate.  2. Right ventricular systolic function is normal. The right ventricular size is normal. There is normal pulmonary artery systolic pressure. The estimated right ventricular  systolic pressure is 94.4 mmHg.  3. The mitral valve is normal in structure. Mild mitral valve regurgitation. No evidence of mitral stenosis.  4. Tricuspid valve regurgitation is mild to moderate.  5. The aortic valve is normal in structure. Aortic valve regurgitation is not visualized. No aortic stenosis is present.  6. The inferior vena cava is normal in size with greater than 50% respiratory variability, suggesting right atrial pressure of 3 mmHg. FINDINGS  Left Ventricle: Left ventricular ejection fraction, by estimation, is 60 to 65%. The left ventricle has normal function. The left ventricle has no regional wall motion abnormalities. The left ventricular internal cavity size was normal in size. There is  no left ventricular hypertrophy. Left ventricular diastolic parameters are indeterminate. Right Ventricle: The right ventricular size is normal. No increase in right ventricular wall thickness. Right ventricular systolic function is normal. There is normal pulmonary artery systolic pressure. The tricuspid regurgitant velocity is 2.17 m/s, and  with an assumed right atrial pressure of 3 mmHg, the estimated right ventricular systolic pressure is 96.7 mmHg. Left Atrium: Left atrial size was normal in size. Right Atrium: Right atrial size was normal in size. Pericardium: There is no evidence of pericardial effusion. Mitral Valve: The mitral valve is normal in structure. Mild mitral valve regurgitation. No evidence of mitral valve stenosis. Tricuspid Valve: The tricuspid valve is normal in structure. Tricuspid valve regurgitation is mild to moderate. No evidence of tricuspid stenosis. Aortic Valve: The aortic valve is normal in structure. Aortic valve regurgitation is not visualized. No aortic stenosis is present. Aortic valve mean gradient measures 4.0 mmHg. Aortic valve peak gradient measures 6.7 mmHg. Aortic valve area, by VTI measures 1.70 cm. Pulmonic Valve: The pulmonic valve was normal in structure.  Pulmonic valve regurgitation is not visualized. No evidence of pulmonic stenosis. Aorta: The aortic root is normal in size and structure. Venous: The inferior vena cava is normal in size with greater than 50% respiratory variability, suggesting right atrial pressure of 3 mmHg. IAS/Shunts: No atrial level shunt detected by color flow Doppler.  LEFT VENTRICLE PLAX 2D LVIDd:         3.60 cm LVIDs:         2.30 cm LV PW:         1.10 cm LV IVS:        1.00 cm LVOT diam:     1.70 cm LV SV:         38 LV SV Index:   22 LVOT Area:     2.27 cm  LV Volumes (MOD) LV vol d, MOD A2C: 63.6  ml LV vol d, MOD A4C: 63.4 ml LV vol s, MOD A2C: 26.2 ml LV vol s, MOD A4C: 26.6 ml LV SV MOD A2C:     37.4 ml LV SV MOD A4C:     63.4 ml LV SV MOD BP:      38.0 ml RIGHT VENTRICLE RV Basal diam:  3.30 cm RV Mid diam:    2.90 cm RV S prime:     13.60 cm/s TAPSE (M-mode): 1.2 cm LEFT ATRIUM             Index        RIGHT ATRIUM           Index LA diam:        3.80 cm 2.19 cm/m   RA Area:     12.70 cm LA Vol (A2C):   54.0 ml 31.09 ml/m  RA Volume:   28.60 ml  16.47 ml/m LA Vol (A4C):   49.7 ml 28.61 ml/m LA Biplane Vol: 52.5 ml 30.23 ml/m  AORTIC VALVE                    PULMONIC VALVE AV Area (Vmax):    2.18 cm     PV Vmax:       0.72 m/s AV Area (Vmean):   1.93 cm     PV Vmean:      46.000 cm/s AV Area (VTI):     1.70 cm     PV VTI:        0.118 m AV Vmax:           129.00 cm/s  PV Peak grad:  2.1 mmHg AV Vmean:          93.100 cm/s  PV Mean grad:  1.0 mmHg AV VTI:            0.222 m AV Peak Grad:      6.7 mmHg AV Mean Grad:      4.0 mmHg LVOT Vmax:         124.00 cm/s LVOT Vmean:        79.200 cm/s LVOT VTI:          0.166 m LVOT/AV VTI ratio: 0.75  AORTA Ao Root diam: 2.70 cm Ao Asc diam:  3.10 cm TRICUSPID VALVE TR Peak grad:   18.8 mmHg TR Mean grad:   14.0 mmHg TR Vmax:        217.00 cm/s TR Vmean:       180.0 cm/s  SHUNTS Systemic VTI:  0.17 m Systemic Diam: 1.70 cm Candee Furbish MD Electronically signed by Candee Furbish MD  Signature Date/Time: 10/17/2021/3:29:58 PM    Final    CT IMAGE GUIDED DRAINAGE BY PERCUTANEOUS CATHETER  Result Date: 10/16/2021 INDICATION: Recent abdominal surgery.  Intra-abdominal abscesses. EXAM: CT IMAGE GUIDED DRAINAGE BY PERCUTANEOUS CATHETER X2 RADIATION DOSE REDUCTION: This exam was performed according to the departmental dose-optimization program which includes automated exposure control, adjustment of the mA and/or kV according to patient size and/or use of iterative reconstruction technique. COMPARISON:  CT AP, 10/16/2021 MEDICATIONS: The patient is currently admitted to the hospital and receiving intravenous antibiotics. The antibiotics were administered within an appropriate time frame prior to the initiation of the procedure. ANESTHESIA/SEDATION: Moderate (conscious) sedation was employed during this procedure. A total of Versed 1 mg and Fentanyl 100 mcg was administered intravenously. Moderate Sedation Time: 60 minutes. The patient's level of consciousness and vital signs were monitored continuously by radiology nursing throughout  the procedure under my direct supervision. CONTRAST:  None COMPLICATIONS: None immediate. PROCEDURE: Informed written consent was obtained from the patient and/or patient's representative after a discussion of the risks, benefits and alternatives to treatment. The patient was placed supine on the CT gantry and a pre procedural CT was performed re-demonstrating the known abscess/fluid collection within the LEFT upper quadrant and midline. The procedure was planned. A timeout was performed prior to the initiation of the procedure. The LEFT upper quadrant and midline was prepped and draped in the usual sterile fashion. The overlying soft tissues were anesthetized with 1% lidocaine with epinephrine. Appropriate trajectory was planned with the use of a 22 gauge spinal needle. An 18 gauge trocar needle was advanced into the abscess/fluid collection at the medial LEFT upper  quadrant and a short Amplatz super stiff wire was coiled within the collection. Appropriate positioning was confirmed with a limited CT scan. The tract was serially dilated allowing placement of a 12 Fr drainage catheter. Appropriate positioning was confirmed with a limited postprocedural CT scan. The procedure was repeated for the lateral LEFT upper quadrant air-containing collection. A 14 Fr drainage catheter was placed. The tubes were connected to a bulb suction and sutured in place. A dressing was placed. The patient tolerated the procedure without immediate post procedural complication. IMPRESSION: 1. Successful CT guided placement of a 12 Fr medial LEFT upper quadrant and 14 Fr lateral LEFT upper quadrant drainage catheters. 2. The medial LEFT upper quadrant drain aspirated a thin bilious tinged fluid, while the lateral LEFT upper quadrant drain aspirated thick succus. Samples were sent to the laboratory as requested by the ordering clinical team. Michaelle Birks, MD [facility] Radiology Electronically Signed   By: Michaelle Birks M.D.   On: 10/16/2021 20:29   CT IMAGE GUIDED DRAINAGE BY PERCUTANEOUS CATHETER  Result Date: 10/16/2021 INDICATION: Recent abdominal surgery.  Intra-abdominal abscesses. EXAM: CT IMAGE GUIDED DRAINAGE BY PERCUTANEOUS CATHETER X2 RADIATION DOSE REDUCTION: This exam was performed according to the departmental dose-optimization program which includes automated exposure control, adjustment of the mA and/or kV according to patient size and/or use of iterative reconstruction technique. COMPARISON:  CT AP, 10/16/2021 MEDICATIONS: The patient is currently admitted to the hospital and receiving intravenous antibiotics. The antibiotics were administered within an appropriate time frame prior to the initiation of the procedure. ANESTHESIA/SEDATION: Moderate (conscious) sedation was employed during this procedure. A total of Versed 1 mg and  Fentanyl 100 mcg was administered intravenously. Moderate Sedation Time: 60 minutes. The patient's level of consciousness and vital signs were monitored continuously by radiology nursing throughout the procedure under my direct supervision. CONTRAST:  None COMPLICATIONS: None immediate. PROCEDURE: Informed written consent was obtained from the patient and/or patient's representative after a discussion of the risks, benefits and alternatives to treatment. The patient was placed supine on the CT gantry and a pre procedural CT was performed re-demonstrating the known abscess/fluid collection within the LEFT upper quadrant and midline. The procedure was planned. A timeout was performed prior to the initiation of the procedure. The LEFT upper quadrant and midline was prepped and draped in the usual sterile fashion. The overlying soft tissues were anesthetized with 1% lidocaine with epinephrine. Appropriate trajectory was planned with the use of a 22 gauge spinal needle. An 18 gauge trocar needle was advanced into the abscess/fluid collection at the medial LEFT upper quadrant and a short Amplatz super stiff wire was coiled within the collection. Appropriate positioning was confirmed with a limited  CT scan. The tract was serially dilated allowing placement of a 12 Fr drainage catheter. Appropriate positioning was confirmed with a limited postprocedural CT scan. The procedure was repeated for the lateral LEFT upper quadrant air-containing collection. A 14 Fr drainage catheter was placed. The tubes were connected to a bulb suction and sutured in place. A dressing was placed. The patient tolerated the procedure without immediate post procedural complication. IMPRESSION: 1. Successful CT guided placement of a 12 Fr medial LEFT upper quadrant and 14 Fr lateral LEFT upper quadrant drainage catheters. 2. The medial LEFT upper quadrant drain aspirated a thin bilious tinged fluid, while the lateral LEFT upper quadrant drain  aspirated thick succus. Samples were sent to the laboratory as requested by the ordering clinical team. Michaelle Birks, MD [facility] Radiology Electronically Signed   By: Michaelle Birks M.D.   On: 10/16/2021 20:29   [place] EKG SITE RITE  Result Date: 10/20/2021 If [facility] image not attached, placement could not be confirmed due to current cardiac rhythm.   Labs:  CBC: Recent Labs    10/18/21 0239 10/18/21 2348 10/19/21 1602 10/20/21 0745  WBC 16.0* 14.0* 11.6* 9.6  HGB 9.4* 9.7* 9.6* 9.4*  HCT 28.8* 29.7* 29.6* 28.4*  PLT 398 403* 466* 467*    COAGS: Recent Labs    10/17/21 0007  INR 1.3*    BMP: Recent Labs    10/18/21 0239 10/18/21 2348 10/19/21 1602 10/20/21 0745  NA 135 135 133* 133*  K 2.9* 3.1* 3.3* 3.0*  CL 107 106 103 104  CO2 20* 21* 22 21*  GLUCOSE 110* 83 86 91  BUN 16 13 12 9   CALCIUM 7.8* 7.7* 7.7* 7.7*  CREATININE 1.20* 1.18* 1.10* 1.16*  GFRNONAA 53* 54* 59* 55*    LIVER FUNCTION TESTS: Recent Labs    10/16/21 1230  BILITOT 0.8  AST 22  ALT 14  ALKPHOS 74  PROT 6.9  ALBUMIN 2.7*    Assessment and Plan:  S/P Extensive abdominal surgery with anastomotic leak and intra-abdominal fluid collections.  S/P drain placements by Dr. Maryelizabeth Kaufmann on 10/16/21 =  to gravity bags as of yesterday.  Images from CT done yesterday reviewed by Dr. Kathlene Cote and Dr. Laurence Ferrari.  Now needing manipulation of existing drains and possible placement of a third drain if a safe percutaneous window opens up during scanning.  Risks and benefits discussed with the patient including bleeding, infection, damage to adjacent structures, bowel perforation/fistula connection, and sepsis.  All of the patient's questions were answered, patient is agreeable to proceed. Consent signed and in chart.   Electronically Signed: Murrell Redden, PA-C 10/20/2021, 2:29 PM    I spent a total of 25 Minutes at the the patient's bedside AND  on the patient's hospital floor or unit, greater than 50% of which was counseling/coordinating care for drain manipulation/drain placement.

## 2021-10-20 NOTE — Progress Notes (Signed)
PHARMACY NOTE -  Diflucan  Pharmacy has been assisting with dosing of fluconazole for intra-abdominal abscesses. Dosage remains stable at 400 mg IV daily and further renal adjustments per institutional Pharmacy antibiotic protocol Have set stop date for 14 days (standard therapy for intra-abd infxn) On concomitant amiodarone; QTc appears stable WNL per EKG 2/24  Pharmacy will sign off, following peripherally for culture results or dose adjustments. Please reconsult if a change in clinical status warrants re-evaluation of dosage.  Reuel Boom, PharmD, BCPS [phone] 10/20/2021, 11:19 AM

## 2021-10-20 NOTE — Progress Notes (Signed)
Spoke with Dr. Marlou Starks regarding a PICC order placed today. MD clarified the PICC will be used for TPN to be started today.

## 2021-10-20 NOTE — Progress Notes (Signed)
Progress Note  Patient Name: Julie Williams Date of Encounter: 10/20/2021  Primary Cardiologist: Shirlee More, MD   Subjective   Wants to go home.  Inpatient Medications    Scheduled Meds:  (feeding supplement) PROSource Plus  30 mL Oral Daily   acetaminophen  650 mg Oral Q6H   amiodarone  400 mg Oral BID   Chlorhexidine Gluconate Cloth  6 each Topical Daily   enoxaparin (LOVENOX) injection  70 mg Subcutaneous Q12H   feeding supplement  1 Container Oral BID BM   levothyroxine  25 mcg Oral Q0600   loratadine  10 mg Oral Daily   mouth rinse  15 mL Mouth Rinse BID   metoCLOPramide (REGLAN) injection  10 mg Intravenous Q6H   multivitamin with minerals  1 tablet Oral Daily   pantoprazole  40 mg Oral Daily   vortioxetine HBr  10 mg Oral Daily   Continuous Infusions:  sodium chloride Stopped (10/18/21 0522)   fluconazole (DIFLUCAN) IV Stopped (10/19/21 1127)   methocarbamol (ROBAXIN) IV 500 mg (10/17/21 0043)   piperacillin-tazobactam (ZOSYN)  IV 12.5 mL/hr at 10/20/21 0800   PRN Meds: sodium chloride, HYDROmorphone (DILAUDID) injection, lip balm, methocarbamol (ROBAXIN) IV, morphine, ondansetron (ZOFRAN) IV, prochlorperazine, simethicone   Vital Signs    Vitals:   10/20/21 0300 10/20/21 0400 10/20/21 0500 10/20/21 0800  BP: 96/64 109/73 117/71 105/71  Pulse: 89 91 95 88  Resp: 13 17 18 18   Temp:  98.2 F (36.8 C)    TempSrc:  Oral    SpO2: 93% 94% 98% 95%  Weight:      Height:        Intake/Output Summary (Last 24 hours) at 10/20/2021 0945 Last data filed at 10/20/2021 0800 Gross per 24 hour  Intake 1922.83 ml  Output 725 ml  Net 1197.83 ml   Filed Weights   10/17/21 1255  Weight: 66.8 kg    Telemetry    nsr - Personally Reviewed  ECG    none - Personally Reviewed  Physical Exam   GEN: No acute distress.   Neck: No JVD Cardiac: RRR, no murmurs, rubs, or gallops.  Respiratory: Clear to auscultation bilaterally. GI: Soft, nontender,  non-distended  MS: No edema; No deformity. Neuro:  Nonfocal  Psych: Normal affect   Labs    Chemistry Recent Labs  Lab 10/16/21 1230 10/17/21 0007 10/18/21 2348 10/19/21 1602 10/20/21 0745  NA 135   < > 135 133* 133*  K 3.5   < > 3.1* 3.3* 3.0*  CL 102   < > 106 103 104  CO2 24   < > 21* 22 21*  GLUCOSE 89   < > 83 86 91  BUN 22*   < > 13 12 9   CREATININE 1.23*   < > 1.18* 1.10* 1.16*  CALCIUM 8.6*   < > 7.7* 7.7* 7.7*  PROT 6.9  --   --   --   --   ALBUMIN 2.7*  --   --   --   --   AST 22  --   --   --   --   ALT 14  --   --   --   --   ALKPHOS 74  --   --   --   --   BILITOT 0.8  --   --   --   --   GFRNONAA 51*   < > 54* 59* 55*  ANIONGAP 9   < >  8 8 8    < > = values in this interval not displayed.     Hematology Recent Labs  Lab 10/18/21 2348 10/19/21 1602 10/20/21 0745  WBC 14.0* 11.6* 9.6  RBC 2.98* 2.98* 2.87*  HGB 9.7* 9.6* 9.4*  HCT 29.7* 29.6* 28.4*  MCV 99.7 99.3 99.0  MCH 32.6 32.2 32.8  MCHC 32.7 32.4 33.1  RDW 13.8 13.8 14.0  PLT 403* 466* 467*    Cardiac EnzymesNo results for input(s): TROPONINI in the last 168 hours. No results for input(s): TROPIPOC in the last 168 hours.   BNP Recent Labs  Lab 10/20/21 0745  BNP 840.2*     DDimer No results for input(s): DDIMER in the last 168 hours.   Radiology    CT CHEST ABDOMEN PELVIS W CONTRAST  Result Date: 10/19/2021 CLINICAL DATA:  Status post open small-bowel resection. Primary closure of epigastric hernia and lysis of adhesions on 10/09/2021. Anastomotic leak on 10/16/2021. Two left-sided interventional radiology drains placed. Evaluate chest for abnormal chest radiographic findings. EXAM: CT CHEST, ABDOMEN, AND PELVIS WITH CONTRAST TECHNIQUE: Multidetector CT imaging of the chest, abdomen and pelvis was performed following the standard protocol during bolus administration of intravenous contrast. RADIATION DOSE REDUCTION: This exam was performed according to the departmental  dose-optimization program which includes automated exposure control, adjustment of the mA and/or kV according to patient size and/or use of iterative reconstruction technique. CONTRAST:  152mL OMNIPAQUE IOHEXOL 300 MG/ML  SOLN COMPARISON:  Chest radiograph of 10/17/2021. Abdominopelvic CT of 10/16/2021. FINDINGS: CT CHEST FINDINGS Cardiovascular: Aortic atherosclerosis. Tortuous thoracic aorta. Mild cardiomegaly, without pericardial effusion. No central pulmonary embolism, on this non-dedicated study. Pulmonary artery enlargement, outflow tract 3.1 cm Mediastinum/Nodes: Precarinal node measures 1.4 cm in 25/2 and is significantly enlarged from 2020. Prevascular nodes up to 11 mm in 25/2, also significantly enlarged since the prior. No hilar adenopathy. Minimal extraluminal gas tracks along the esophagus from the upper abdomen. Lungs/Pleura: Small bilateral pleural effusions, including fluid tracking in the right major fissure. Mild centrilobular emphysema. Smooth septal thickening, most significant in the apices, suggests interstitial edema. Bibasilar subsegmental atelectasis. Dependent left apical opacity is increased compared to 10/16/2018, including on images 30 and 36 of series 6. Musculoskeletal: No acute osseous abnormality. Lower cervical spine fixation. CT ABDOMEN PELVIS FINDINGS Hepatobiliary: Hepatic steatosis. Prominent lateral segment left liver lobe. No focal liver lesion. Normal gallbladder, without biliary ductal dilatation. Pancreas: Artifact degradation within the upper abdomen from percutaneous catheters. Grossly normal pancreas. Spleen: Normal in size, without focal abnormality. Adrenals/Urinary Tract: Normal adrenal glands. Normal kidneys, without hydronephrosis. Normal urinary bladder. Stomach/Bowel: Proximal gastric underdistention. Surgical sutures in the rectum. Normal appendix. At least 1 enterotomy site. The more cephalad left-sided percutaneous drain traverses a contrast and gas collection  on the order of 10.1 x 2.9 cm in 73/2. This is the site of a approximately 10.9 x 5.5 cm primarily gas collection on the prior exam. The more caudal and anterior percutaneous drain terminates in a somewhat ill-defined small-bowel mesenteric collection of 6.2 x 2.8 cm on 84/2. Compare 8.2 x 3.8 cm on the prior exam (when remeasured). Just caudal to this, an interloop fluid and gas collection, which may be separate from the catheter measures 8.9 x 5.3 cm 92/2 versus 9.2 by 4.9 cm on the prior exam (when remeasured). A lateral left abdominal somewhat ill-defined fluid collection with peripheral enhancement measures 9.0 x 3.0 cm and 85/2 and is more well-defined today but relatively similar in size to the prior  exam. This tracks caudally along the left iliacus muscle including 90/2. Extraluminal contrast is also identified throughout the lower left abdomen and pelvis, including bone 98/2. Small right lower abdominal fluid collection of 1.9 x 4.5 cm on 85/2 is grossly similar to on the prior. Extraluminal gas and fluid both within the intraperitoneal abdomen and anterior abdominal wall. Vascular/Lymphatic: Aortic atherosclerosis. No abdominopelvic adenopathy. Reproductive: Normal uterus. Prominent gonadal veins as can be seen with pelvic congestion syndrome. Other: Anasarca. Musculoskeletal: No acute osseous abnormality. IMPRESSION: 1. Placement of 2 left-sided percutaneous drain since 10/16/2021. Multiple residual fluid collections, as detailed above. Extensive extraluminal gas and contrast. Although this could all be secondary to prior anastomotic leak 3 days ago, ongoing anastomotic leak (especially at the site of the more cephalad, lateral percutaneous drain) remains a concern. 2. No bowel obstruction. 3. Findings of congestive heart failure, including bilateral pleural effusions and septal thickening. 4. The plain film abnormality corresponds to increased progressive dependent left apical pulmonary opacity, favored  to represent progressive pleuroparenchymal scarring since 2020. Consider chest CT follow-up in 3 months to confirm stability and exclude less likely developing mass. 5. Thoracic adenopathy which is likely related to fluid overload/congestive heart failure. This could also be re-evaluated on follow-up chest CT. 6. Hepatic steatosis. Lateral segment left liver lobe prominence, which can be seen in mild cirrhosis. Correlate with risk factors. 7. Aortic atherosclerosis (ICD10-I70.0) and emphysema (ICD10-J43.9). Electronically Signed   By: Abigail Miyamoto M.D.   On: 10/19/2021 14:22    Cardiac Studies   Echo reviewed  Patient Profile     56 y.o. female admitted with perforated viscus, post op atrial fib back to NSR on amiodarone.  Assessment & Plan    Post op atrial fib - she is maintaining NSR on amiodarone. Now on po. Continue amiodarone 200 mg po twice daily. If she is unable to take po, switch to IV 0.5 mg/min.  [facility] will sign off.   Medication Recommendations:  continue amiodarone 200 mg twice daily for a month then stop.  Other recommendations (labs, testing, etc):  none Follow up as an outpatient:  none  For questions or updates, please contact [place] Please consult [email or web address] for contact info under Cardiology/STEMI.      Signed, Cristopher Peru, MD  10/20/2021, 9:45 AM

## 2021-10-20 NOTE — Progress Notes (Signed)
°  CT scan reviewed by Dr. Kathlene Cote and by Dr. Laurence Ferrari.  Both existing drains need to be manipulated/pulled back. She probably needs a third drain into the central abscess if there is a safe percutaneous window at time of scanning.  I went to see the patient and explain the findings and need for drain manipulation and possibly a third drain.  She would like to discuss this with the surgeon before proceeding.  I will see her again tomorrow and if she is agreeable will proceed as IR schedule allows.  Bertis Hustead S Ketzaly Cardella PA-C 10/20/2021 11:51 AM

## 2021-10-21 ENCOUNTER — Inpatient Hospital Stay (HOSPITAL_COMMUNITY): Payer: Medicare Other

## 2021-10-21 LAB — BASIC METABOLIC PANEL
Anion gap: 6 (ref 5–15)
BUN: 7 mg/dL (ref 6–20)
CO2: 21 mmol/L — ABNORMAL LOW (ref 22–32)
Calcium: 7.7 mg/dL — ABNORMAL LOW (ref 8.9–10.3)
Chloride: 108 mmol/L (ref 98–111)
Creatinine, Ser: 1.05 mg/dL — ABNORMAL HIGH (ref 0.44–1.00)
GFR, Estimated: >60 mL/min (ref >60)
Glucose, Bld: 102 mg/dL — ABNORMAL HIGH (ref 70–99)
Potassium: 3.2 mmol/L — ABNORMAL LOW (ref 3.5–5.1)
Sodium: 135 mmol/L (ref 135–145)

## 2021-10-21 LAB — CBC
HCT: 27 % — ABNORMAL LOW (ref 36.0–46.0)
Hemoglobin: 8.6 g/dL — ABNORMAL LOW (ref 12.0–15.0)
MCH: 32 pg (ref 26.0–34.0)
MCHC: 31.9 g/dL (ref 30.0–36.0)
MCV: 100.4 fL — ABNORMAL HIGH (ref 80.0–100.0)
Platelets: 485 10*3/uL — ABNORMAL HIGH (ref 150–400)
RBC: 2.69 MIL/uL — ABNORMAL LOW (ref 3.87–5.11)
RDW: 13.9 % (ref 11.5–15.5)
WBC: 9.9 10*3/uL (ref 4.0–10.5)
nRBC: 0 % (ref 0.0–0.2)

## 2021-10-21 LAB — CULTURE, BLOOD (ROUTINE X 2)
Culture: NO GROWTH
Culture: NO GROWTH

## 2021-10-21 LAB — AEROBIC/ANAEROBIC CULTURE W GRAM STAIN (SURGICAL/DEEP WOUND): Culture: NO GROWTH

## 2021-10-21 LAB — MAGNESIUM: Magnesium: 1.3 mg/dL — ABNORMAL LOW (ref 1.7–2.4)

## 2021-10-21 LAB — PHOSPHORUS: Phosphorus: 3.5 mg/dL (ref 2.5–4.6)

## 2021-10-21 LAB — GLUCOSE, CAPILLARY: Glucose-Capillary: 118 mg/dL — ABNORMAL HIGH (ref 70–99)

## 2021-10-21 MED ORDER — ADULT MULTIVITAMIN W/MINERALS CH
1.0000 | ORAL_TABLET | Freq: Every day | ORAL | Status: DC
  Administered 2021-10-22 – 2021-10-23 (×2): 1 via ORAL
  Filled 2021-10-21 (×2): qty 1

## 2021-10-21 MED ORDER — MAGNESIUM SULFATE 4 GM/100ML IV SOLN
4.0000 g | Freq: Once | INTRAVENOUS | Status: AC
  Administered 2021-10-21: 4 g via INTRAVENOUS
  Filled 2021-10-21: qty 100

## 2021-10-21 MED ORDER — FUROSEMIDE 10 MG/ML IJ SOLN
40.0000 mg | Freq: Once | INTRAMUSCULAR | Status: AC
Start: 2021-10-21 — End: 2021-10-21
  Administered 2021-10-21: 40 mg via INTRAVENOUS
  Filled 2021-10-21: qty 4

## 2021-10-21 MED ORDER — MIDAZOLAM HCL 2 MG/2ML IJ SOLN
INTRAMUSCULAR | Status: AC | PRN
  Administered 2021-10-21: 1 mg via INTRAVENOUS
  Administered 2021-10-21 (×2): .5 mg via INTRAVENOUS
  Administered 2021-10-21: 1 mg via INTRAVENOUS

## 2021-10-21 MED ORDER — SODIUM CHLORIDE 0.9 % IV SOLN: INTRAVENOUS | Status: AC

## 2021-10-21 MED ORDER — SODIUM CHLORIDE 0.9% FLUSH
5.0000 mL | Freq: Three times a day (TID) | INTRAVENOUS | Status: DC
  Administered 2021-10-21 – 2021-10-24 (×9): 5 mL

## 2021-10-21 MED ORDER — FENTANYL CITRATE (PF) 100 MCG/2ML IJ SOLN
INTRAMUSCULAR | Status: AC
Start: 2021-10-21 — End: 2021-10-21
  Filled 2021-10-21: qty 6

## 2021-10-21 MED ORDER — TRAVASOL 10 % IV SOLN
INTRAVENOUS | Status: AC
  Filled 2021-10-21: qty 480

## 2021-10-21 MED ORDER — TRAVASOL 10 % IV SOLN
INTRAVENOUS | Status: DC
  Filled 2021-10-21: qty 480

## 2021-10-21 MED ORDER — FENTANYL CITRATE (PF) 100 MCG/2ML IJ SOLN
INTRAMUSCULAR | Status: AC | PRN
  Administered 2021-10-21 (×2): 25 ug via INTRAVENOUS
  Administered 2021-10-21: 50 ug via INTRAVENOUS

## 2021-10-21 MED ORDER — INSULIN ASPART 100 UNIT/ML IJ SOLN
0.0000 [IU] | Freq: Three times a day (TID) | INTRAMUSCULAR | Status: DC
  Administered 2021-10-23: 1 [IU] via SUBCUTANEOUS

## 2021-10-21 MED ORDER — POTASSIUM CHLORIDE 10 MEQ/50ML IV SOLN
10.0000 meq | INTRAVENOUS | Status: AC
  Administered 2021-10-21 (×6): 10 meq via INTRAVENOUS
  Filled 2021-10-21 (×6): qty 50

## 2021-10-21 MED ORDER — MIDAZOLAM HCL 2 MG/2ML IJ SOLN
INTRAMUSCULAR | Status: AC
  Filled 2021-10-21: qty 8

## 2021-10-21 NOTE — Progress Notes (Addendum)
PHARMACY - TOTAL PARENTERAL NUTRITION CONSULT NOTE   Indication: Fistula  Patient Measurements: Height: 5\' 5"  (165.1 cm) Weight: 66.8 kg (147 lb 4.3 oz) IBW/kg (Calculated) : 57 TPN AdjBW (KG): 66.8 Body mass index is 24.51 kg/m. Usual Weight: 62-68 kg in past 2 yr  Assessment: 69 yoF with complicated abd surgical Hx including stercoral perforation, colostomy & subsequent takedown in 2020, recently admitted 2/14-2/17 for elective incisional hernia repair and SB resection to remove SB mass (which turned out to be a contained jejunal perforation), readmitted 2/21 with subsequent abdominal pain. Workup revealed pneumoperitoneum and concern for anastomotic leak. Two drains were placed for intra-abdominal abscesses. On POD11, noted to have persistent leakage from anastomosis on CTa/p, as well as new intra-abd fluid collection. FL diet discontinued, and Pharmacy consulted to start TPN on 2/26.  Glucose: No Hx DM; CBGs well controlled prior to starting TPN Electrolytes: K & Mg both low; otherwise WNL - new order for Lasix x 1 noted Renal: AKI during prior admission; SCr, BUN now back to baseline; bicarb remains borderline low; UOP not charted Hepatic: labs pending; low albumin but o/w WNL on 2/21 I/O: No abd drain output since 2/24; no emesis; LBM 2/25 (small-mod liquid stool) - MIVF: D5-1/2NS @ 100  GI Imaging: - 2/21 CTa/p c/w anastomotic leak, peritonitis, developing abscesses; hepatic steatosis noted - 2/24 CTa/p: Extensive extraluminal gas and contrast c/w ongoing anastomotic leak, multiple residual fluid collections GI Surgeries / Procedures:  - 2/21: intra-abd drains placed x 2 in IR - 2/26: placement of 3rd abd drain; original 2 drains repositioned  Central access: PICC placed 2/25 TPN start date: 2/26  Nutritional Goals: RD recs pending  Plan:  Mag 4g IV x 1 KCl 10 mEq IV x 6  Start TPN at 40 mL/hr at 1800 - pending RD recs, will use standard formula with 50 g/L protein, 15%  dextrose, 30 g/L lipids Electrolytes in TPN: incr acetate; hold off on adjusting lytes until Mg/K better repleted Na - 50 mEq/L K - 50 mEq/L Ca - 5 mEq/L Mg - 5 mEq/L Phos - 15 mmol/L Cl:Ac ratio - max Ac Multivitamin PO already ordered; will leave MVI/trace outside TPN d/t national MVI shortage Initiate Sensitive q6h SSI and adjust as needed  Reduce MIVF to 50 mL/hr and change to NS at 1800 Monitor TPN labs on Mon/Thurs  Reuel Boom, PharmD, BCPS [phone] 10/21/2021, 9:39 AM

## 2021-10-21 NOTE — Progress Notes (Signed)
Assessment & Plan: POD#12 - s/p open small bowel resection, primary closure of epigastric hernia, lysis of adhesions             Anastomotic leak - placement of percutaneous drains by IR, fistula to lower wound             NPO - limited clear liquids for comfort, meds PICC line placed - will order TNA to begin this evening IR to reposition drains today, possibly place additional drain in fluid collection   New onset a. Fib with RVR  cardiology evaluation appreciated, on amiodarone ggt, on heparin ggt  AKI - improved since recent hospital stay Hypokalemia - potassium 3.2 this AM Pain/nausea control Increase activity Ostomy appliance for inferior aspect of incision   FEN: NPO, TNA ID: Zosyn, fluconazole f/u final culture results VTE: heparin ggt, scds Foley: None Dispo: Continued care in stepdown        [name], MD       [facility]       Office: [phone]   Chief Complaint: Anastomotic leak follow small bowel resection  Subjective: Patient in bed, ambulated in halls, in stepdown  Objective: Vital signs in last 24 hours: Temp:  [98.4 F (36.9 C)-99.1 F (37.3 C)] 98.4 F (36.9 C) (02/26 0757) Pulse Rate:  [89-99] 91 (02/25 2036) Resp:  [18-24] 18 (02/25 2036) BP: (114-131)/(72-82) 114/74 (02/25 1900) SpO2:  [91 %-95 %] 94 % (02/25 2036) Last BM Date : 10/20/21  Intake/Output from previous day: 02/25 0701 - 02/26 0700 In: 2021.8 [I.V.:1176; IV Piggyback:625.9] Out: -  Intake/Output this shift: No intake/output data recorded.  Physical Exam: HEENT - sclerae clear, mucous membranes moist Neck - soft Abdomen - succus from lower midline wound, minimal in drains Ext - no edema, non-tender Neuro - alert & oriented, no focal deficits  Lab Results:  Recent Labs    10/20/21 0745 10/21/21 0500  WBC 9.6 9.9  HGB 9.4* 8.6*  HCT 28.4* 27.0*  PLT 467* 485*   BMET Recent Labs    10/20/21 0745 10/21/21 0500  NA 133* 135  K 3.0*  3.2*  CL 104 108  CO2 21* 21*  GLUCOSE 91 102*  BUN 9 7  CREATININE 1.16* 1.05*  CALCIUM 7.7* 7.7*   PT/INR No results for input(s): LABPROT, INR in the last 72 hours. Comprehensive Metabolic Panel:    Component Value Date/Time   NA 135 10/21/2021 0500   NA 133 (L) 10/20/2021 0745   NA 139 10/24/2020 1522   NA 137 08/29/2020 1436   K 3.2 (L) 10/21/2021 0500   K 3.0 (L) 10/20/2021 0745   CL 108 10/21/2021 0500   CL 104 10/20/2021 0745   CO2 21 (L) 10/21/2021 0500   CO2 21 (L) 10/20/2021 0745   BUN 7 10/21/2021 0500   BUN 9 10/20/2021 0745   BUN 13 10/24/2020 1522   BUN 14 08/29/2020 1436   CREATININE 1.05 (H) 10/21/2021 0500   CREATININE 1.16 (H) 10/20/2021 0745   CREATININE 0.80 10/29/2018 1556   CREATININE 0.72 04/28/2014 1201   GLUCOSE 102 (H) 10/21/2021 0500   GLUCOSE 91 10/20/2021 0745   CALCIUM 7.7 (L) 10/21/2021 0500   CALCIUM 7.7 (L) 10/20/2021 0745   AST 22 10/16/2021 1230   AST 32 05/27/2019 1218   ALT 14 10/16/2021 1230   ALT 31 05/27/2019 1218   ALKPHOS 74 10/16/2021 1230   ALKPHOS 102 05/27/2019 1218   BILITOT 0.8 10/16/2021 1230  BILITOT 0.8 05/27/2019 1218   PROT 6.9 10/16/2021 1230   PROT 7.8 05/27/2019 1218   ALBUMIN 2.7 (L) 10/16/2021 1230   ALBUMIN 4.4 05/27/2019 1218    Studies/Results: CT CHEST ABDOMEN PELVIS W CONTRAST  Result Date: 10/19/2021 CLINICAL DATA:  Status post open small-bowel resection. Primary closure of epigastric hernia and lysis of adhesions on 10/09/2021. Anastomotic leak on 10/16/2021. Two left-sided interventional radiology drains placed. Evaluate chest for abnormal chest radiographic findings. EXAM: CT CHEST, ABDOMEN, AND PELVIS WITH CONTRAST TECHNIQUE: Multidetector CT imaging of the chest, abdomen and pelvis was performed following the standard protocol during bolus administration of intravenous contrast. RADIATION DOSE REDUCTION: This exam was performed according to the departmental dose-optimization program which  includes automated exposure control, adjustment of the mA and/or kV according to patient size and/or use of iterative reconstruction technique. CONTRAST:  186mL OMNIPAQUE IOHEXOL 300 MG/ML  SOLN COMPARISON:  Chest radiograph of 10/17/2021. Abdominopelvic CT of 10/16/2021. FINDINGS: CT CHEST FINDINGS Cardiovascular: Aortic atherosclerosis. Tortuous thoracic aorta. Mild cardiomegaly, without pericardial effusion. No central pulmonary embolism, on this non-dedicated study. Pulmonary artery enlargement, outflow tract 3.1 cm Mediastinum/Nodes: Precarinal node measures 1.4 cm in 25/2 and is significantly enlarged from 2020. Prevascular nodes up to 11 mm in 25/2, also significantly enlarged since the prior. No hilar adenopathy. Minimal extraluminal gas tracks along the esophagus from the upper abdomen. Lungs/Pleura: Small bilateral pleural effusions, including fluid tracking in the right major fissure. Mild centrilobular emphysema. Smooth septal thickening, most significant in the apices, suggests interstitial edema. Bibasilar subsegmental atelectasis. Dependent left apical opacity is increased compared to 10/16/2018, including on images 30 and 36 of series 6. Musculoskeletal: No acute osseous abnormality. Lower cervical spine fixation. CT ABDOMEN PELVIS FINDINGS Hepatobiliary: Hepatic steatosis. Prominent lateral segment left liver lobe. No focal liver lesion. Normal gallbladder, without biliary ductal dilatation. Pancreas: Artifact degradation within the upper abdomen from percutaneous catheters. Grossly normal pancreas. Spleen: Normal in size, without focal abnormality. Adrenals/Urinary Tract: Normal adrenal glands. Normal kidneys, without hydronephrosis. Normal urinary bladder. Stomach/Bowel: Proximal gastric underdistention. Surgical sutures in the rectum. Normal appendix. At least 1 enterotomy site. The more cephalad left-sided percutaneous drain traverses a contrast and gas collection on the order of 10.1 x 2.9 cm  in 73/2. This is the site of a approximately 10.9 x 5.5 cm primarily gas collection on the prior exam. The more caudal and anterior percutaneous drain terminates in a somewhat ill-defined small-bowel mesenteric collection of 6.2 x 2.8 cm on 84/2. Compare 8.2 x 3.8 cm on the prior exam (when remeasured). Just caudal to this, an interloop fluid and gas collection, which may be separate from the catheter measures 8.9 x 5.3 cm 92/2 versus 9.2 by 4.9 cm on the prior exam (when remeasured). A lateral left abdominal somewhat ill-defined fluid collection with peripheral enhancement measures 9.0 x 3.0 cm and 85/2 and is more well-defined today but relatively similar in size to the prior exam. This tracks caudally along the left iliacus muscle including 90/2. Extraluminal contrast is also identified throughout the lower left abdomen and pelvis, including bone 98/2. Small right lower abdominal fluid collection of 1.9 x 4.5 cm on 85/2 is grossly similar to on the prior. Extraluminal gas and fluid both within the intraperitoneal abdomen and anterior abdominal wall. Vascular/Lymphatic: Aortic atherosclerosis. No abdominopelvic adenopathy. Reproductive: Normal uterus. Prominent gonadal veins as can be seen with pelvic congestion syndrome. Other: Anasarca. Musculoskeletal: No acute osseous abnormality. IMPRESSION: 1. Placement of 2 left-sided percutaneous drain since 10/16/2021. Multiple residual  fluid collections, as detailed above. Extensive extraluminal gas and contrast. Although this could all be secondary to prior anastomotic leak 3 days ago, ongoing anastomotic leak (especially at the site of the more cephalad, lateral percutaneous drain) remains a concern. 2. No bowel obstruction. 3. Findings of congestive heart failure, including bilateral pleural effusions and septal thickening. 4. The plain film abnormality corresponds to increased progressive dependent left apical pulmonary opacity, favored to represent progressive  pleuroparenchymal scarring since 2020. Consider chest CT follow-up in 3 months to confirm stability and exclude less likely developing mass. 5. Thoracic adenopathy which is likely related to fluid overload/congestive heart failure. This could also be re-evaluated on follow-up chest CT. 6. Hepatic steatosis. Lateral segment left liver lobe prominence, which can be seen in mild cirrhosis. Correlate with risk factors. 7. Aortic atherosclerosis (ICD10-I70.0) and emphysema (ICD10-J43.9). Electronically Signed   By: Abigail Miyamoto M.D.   On: 10/19/2021 14:22   [place] EKG SITE RITE  Result Date: 10/20/2021 If [facility] image not attached, placement could not be confirmed due to current cardiac rhythm.     [name] 10/21/2021   Patient ID: Julie Williams, female   DOB: March 11, 1966, 56 y.o.   MRN: [id number]

## 2021-10-21 NOTE — Sedation Documentation (Signed)
Versed 1mg  IV wasted in sink. Witnessed by Dr. Laurence Ferrari and Pearline Cables, [place].

## 2021-10-22 ENCOUNTER — Inpatient Hospital Stay (HOSPITAL_COMMUNITY): Payer: Medicare Other

## 2021-10-22 LAB — COMPREHENSIVE METABOLIC PANEL
ALT: 10 U/L (ref 0–44)
ALT: 9 U/L (ref 0–44)
AST: 13 U/L — ABNORMAL LOW (ref 15–41)
AST: 14 U/L — ABNORMAL LOW (ref 15–41)
Albumin: 1.8 g/dL — ABNORMAL LOW (ref 3.5–5.0)
Albumin: 1.9 g/dL — ABNORMAL LOW (ref 3.5–5.0)
Alkaline Phosphatase: 53 U/L (ref 38–126)
Alkaline Phosphatase: 56 U/L (ref 38–126)
Anion gap: 6 (ref 5–15)
Anion gap: 5 (ref 5–15)
BUN: 7 mg/dL (ref 6–20)
BUN: 8 mg/dL (ref 6–20)
CO2: 22 mmol/L (ref 22–32)
CO2: 22 mmol/L (ref 22–32)
Calcium: 7.9 mg/dL — ABNORMAL LOW (ref 8.9–10.3)
Calcium: 7.9 mg/dL — ABNORMAL LOW (ref 8.9–10.3)
Chloride: 107 mmol/L (ref 98–111)
Chloride: 107 mmol/L (ref 98–111)
Creatinine, Ser: 0.93 mg/dL (ref 0.44–1.00)
Creatinine, Ser: 0.93 mg/dL (ref 0.44–1.00)
GFR, Estimated: >60 mL/min (ref >60)
GFR, Estimated: >60 mL/min (ref >60)
Glucose, Bld: 120 mg/dL — ABNORMAL HIGH (ref 70–99)
Glucose, Bld: 103 mg/dL — ABNORMAL HIGH (ref 70–99)
Potassium: 3.6 mmol/L (ref 3.5–5.1)
Potassium: 4.1 mmol/L (ref 3.5–5.1)
Sodium: 135 mmol/L (ref 135–145)
Sodium: 134 mmol/L — ABNORMAL LOW (ref 135–145)
Total Bilirubin: 0.3 mg/dL (ref 0.3–1.2)
Total Bilirubin: 0.5 mg/dL (ref 0.3–1.2)
Total Protein: 6.4 g/dL — ABNORMAL LOW (ref 6.5–8.1)
Total Protein: 6.8 g/dL (ref 6.5–8.1)

## 2021-10-22 LAB — CBC WITH DIFFERENTIAL/PLATELET
Abs Immature Granulocytes: 0.22 10*3/uL — ABNORMAL HIGH (ref 0.00–0.07)
Basophils Absolute: 0.1 10*3/uL (ref 0.0–0.1)
Basophils Relative: 1 %
Eosinophils Absolute: 0.2 10*3/uL (ref 0.0–0.5)
Eosinophils Relative: 1 %
HCT: 27.3 % — ABNORMAL LOW (ref 36.0–46.0)
Hemoglobin: 8.7 g/dL — ABNORMAL LOW (ref 12.0–15.0)
Immature Granulocytes: 2 %
Lymphocytes Relative: 32 %
Lymphs Abs: 3.5 10*3/uL (ref 0.7–4.0)
MCH: 32.1 pg (ref 26.0–34.0)
MCHC: 31.9 g/dL (ref 30.0–36.0)
MCV: 100.7 fL — ABNORMAL HIGH (ref 80.0–100.0)
Monocytes Absolute: 0.8 10*3/uL (ref 0.1–1.0)
Monocytes Relative: 7 %
Neutro Abs: 6.1 10*3/uL (ref 1.7–7.7)
Neutrophils Relative %: 57 %
Platelets: 495 10*3/uL — ABNORMAL HIGH (ref 150–400)
RBC: 2.71 MIL/uL — ABNORMAL LOW (ref 3.87–5.11)
RDW: 14 % (ref 11.5–15.5)
WBC: 10.8 10*3/uL — ABNORMAL HIGH (ref 4.0–10.5)
nRBC: 0 % (ref 0.0–0.2)

## 2021-10-22 LAB — TRIGLYCERIDES: Triglycerides: 77 mg/dL (ref <150)

## 2021-10-22 LAB — PHOSPHORUS: Phosphorus: 3.3 mg/dL (ref 2.5–4.6)

## 2021-10-22 LAB — GLUCOSE, CAPILLARY
Glucose-Capillary: 117 mg/dL — ABNORMAL HIGH (ref 70–99)
Glucose-Capillary: 105 mg/dL — ABNORMAL HIGH (ref 70–99)
Glucose-Capillary: 108 mg/dL — ABNORMAL HIGH (ref 70–99)

## 2021-10-22 LAB — MAGNESIUM: Magnesium: 1.8 mg/dL (ref 1.7–2.4)

## 2021-10-22 LAB — LACTIC ACID, PLASMA
Lactic Acid, Venous: 2 mmol/L (ref 0.5–1.9)
Lactic Acid, Venous: 1.8 mmol/L (ref 0.5–1.9)

## 2021-10-22 MED ORDER — SODIUM CHLORIDE 0.9 % IV SOLN: INTRAVENOUS | Status: AC

## 2021-10-22 MED ORDER — POTASSIUM CHLORIDE 10 MEQ/50ML IV SOLN
10.0000 meq | INTRAVENOUS | Status: AC
  Administered 2021-10-22 (×3): 10 meq via INTRAVENOUS
  Filled 2021-10-22 (×3): qty 50

## 2021-10-22 MED ORDER — FUROSEMIDE 10 MG/ML IJ SOLN
40.0000 mg | Freq: Once | INTRAMUSCULAR | Status: AC
  Administered 2021-10-22: 40 mg via INTRAVENOUS
  Filled 2021-10-22: qty 4

## 2021-10-22 MED ORDER — MAGNESIUM SULFATE 2 GM/50ML IV SOLN
2.0000 g | Freq: Once | INTRAVENOUS | Status: AC
  Administered 2021-10-22: 2 g via INTRAVENOUS
  Filled 2021-10-22: qty 50

## 2021-10-22 MED ORDER — SODIUM CHLORIDE 0.9% FLUSH
10.0000 mL | Freq: Two times a day (BID) | INTRAVENOUS | Status: DC
  Administered 2021-10-22 – 2021-10-23 (×4): 10 mL

## 2021-10-22 MED ORDER — TRAVASOL 10 % IV SOLN
INTRAVENOUS | Status: DC
  Filled 2021-10-22: qty 600

## 2021-10-22 NOTE — Progress Notes (Signed)
This nurse offered patient incentive spirometer as ordered by MD to aid in recovery post op. Patient refused to use as well as refused any further teaching. Patient refusing most care offered a this time.

## 2021-10-22 NOTE — Progress Notes (Signed)
Patient taken to CT and returned safely. Patient pleasant during the trip, but anxious for the results.

## 2021-10-22 NOTE — Progress Notes (Signed)
Referring Physician(s): P. Stechschulte  Supervising Physician: Michaelle Birks  Patient Status:  [facility] - In-[facility]  Chief Complaint:  Abdominal pain s/pa. laparotomy, lysis of adhesions, excision of small bowel mass with small bowel resection and closure of incisional hernia on 2.14.23 . Found to have anastomotic leak  and multiple intra abdominal abscesses s/p. 12 Fr left medial abscess drain and left upper quadrant abscess drain on 2.21.23 by Dr. Maryelizabeth Kaufmann and 12 Fr left lateral abscess drain and reposition of the pre-existing abscess drains by Dr. Laurence Ferrari on 2.26.23  Subjective:  Patient laying in bed. Complaining of pain at around the exit site of the left lateral drain. Patient states that she would "like to go home this week. Like yesterday"  Allergies: Bee venom  Medications: Prior to Admission medications   Medication Sig Start Date End Date Taking? Authorizing Provider  amLODipine (NORVASC) 10 MG tablet Take 10 mg by mouth daily.   Yes [provider]  Biotin w/ Vitamins C & E (HAIR/SKIN/NAILS PO) Take 1 tablet by mouth daily.   Yes [provider]  Cholecalciferol (VITAMIN D3) 250 MCG (10000 UT) TABS Take 10,000 mcg by mouth daily.   Yes [provider]  EPINEPHrine 0.3 mg/0.3 mL IJ SOAJ injection Inject 0.3 mg into the muscle as needed for anaphylaxis. 05/12/21  Yes Sponseller, Gypsy Balsam, PA-C  levothyroxine (SYNTHROID) 25 MCG tablet Take 1 tablet (25 mcg total) by mouth daily before breakfast. 05/08/21  Yes Aslam, Sadia, MD  morphine (MSIR) 30 MG tablet Take 1 tablet (30 mg total) by mouth every 6 (six) hours as needed for severe pain. Patient taking differently: Take 30 mg by mouth every 5 (five) hours as needed for severe pain. 04/24/21  Yes Aslam, Loralyn Freshwater, MD  naloxone (NARCAN) nasal spray 4 mg/0.1 mL 1 spray as needed (overdose). 05/22/21  Yes [provider]  omeprazole (PRILOSEC) 40 MG capsule TAKE 1 CAPSULE BY MOUTH EVERY DAY 09/28/20  Yes  Katsadouros, Vasilios, MD  polyethylene glycol (MIRALAX / GLYCOLAX) 17 g packet Take 17 g by mouth daily. Patient taking differently: Take 17 g by mouth daily as needed for moderate constipation. 16/10/96  Yes Leighton Ruff, MD  vortioxetine HBr (TRINTELLIX) 10 MG TABS tablet Take 1 tablet (10 mg total) by mouth daily. 01/18/21  Yes Katsadouros, Vasilios, MD     Vital Signs: BP 125/80    Pulse 93    Temp 98.5 F (36.9 C) (Oral)    Resp 16    Ht 5\' 5"  (1.651 m)    Wt 147 lb 4.3 oz (66.8 kg)    SpO2 95%    BMI 24.51 kg/m   Physical Exam Vitals and nursing note reviewed.  Constitutional:      Appearance: She is well-developed.  HENT:     Head: Normocephalic and atraumatic.  Eyes:     Conjunctiva/sclera: Conjunctivae normal.  Pulmonary:     Effort: Pulmonary effort is normal.  Abdominal:     Comments:  Drain Location: left medial  Size: Fr size: 12 Fr Date of placement: 2.21.23  Currently to: Drain collection device: gravity Output yellow and clear  Drain Location: LUQ Size: Fr size: 12 Fr Date of placement: 2.21.23  Currently to: Drain collection device: gravity Output purulent yellow  Drain Location: left lateral  Size: Fr size: 12 Fr Date of placement: 2.26.23 Currently to: Drain collection device: gravity Output purulent yellow  Musculoskeletal:        General: Normal range of motion.  Cervical back: Normal range of motion.  Skin:    General: Skin is warm.  Neurological:     Mental Status: She is alert and oriented to person, place, and time.    Imaging: CT CHEST ABDOMEN PELVIS W CONTRAST  Result Date: 10/19/2021 CLINICAL DATA:  Status post open small-bowel resection. Primary closure of epigastric hernia and lysis of adhesions on 10/09/2021. Anastomotic leak on 10/16/2021. Two left-sided interventional radiology drains placed. Evaluate chest for abnormal chest radiographic findings. EXAM: CT CHEST, ABDOMEN, AND PELVIS WITH CONTRAST TECHNIQUE: Multidetector CT  imaging of the chest, abdomen and pelvis was performed following the standard protocol during bolus administration of intravenous contrast. RADIATION DOSE REDUCTION: This exam was performed according to the departmental dose-optimization program which includes automated exposure control, adjustment of the mA and/or kV according to patient size and/or use of iterative reconstruction technique. CONTRAST:  13mL OMNIPAQUE IOHEXOL 300 MG/ML  SOLN COMPARISON:  Chest radiograph of 10/17/2021. Abdominopelvic CT of 10/16/2021. FINDINGS: CT CHEST FINDINGS Cardiovascular: Aortic atherosclerosis. Tortuous thoracic aorta. Mild cardiomegaly, without pericardial effusion. No central pulmonary embolism, on this non-dedicated study. Pulmonary artery enlargement, outflow tract 3.1 cm Mediastinum/Nodes: Precarinal node measures 1.4 cm in 25/2 and is significantly enlarged from 2020. Prevascular nodes up to 11 mm in 25/2, also significantly enlarged since the prior. No hilar adenopathy. Minimal extraluminal gas tracks along the esophagus from the upper abdomen. Lungs/Pleura: Small bilateral pleural effusions, including fluid tracking in the right major fissure. Mild centrilobular emphysema. Smooth septal thickening, most significant in the apices, suggests interstitial edema. Bibasilar subsegmental atelectasis. Dependent left apical opacity is increased compared to 10/16/2018, including on images 30 and 36 of series 6. Musculoskeletal: No acute osseous abnormality. Lower cervical spine fixation. CT ABDOMEN PELVIS FINDINGS Hepatobiliary: Hepatic steatosis. Prominent lateral segment left liver lobe. No focal liver lesion. Normal gallbladder, without biliary ductal dilatation. Pancreas: Artifact degradation within the upper abdomen from percutaneous catheters. Grossly normal pancreas. Spleen: Normal in size, without focal abnormality. Adrenals/Urinary Tract: Normal adrenal glands. Normal kidneys, without hydronephrosis. Normal urinary  bladder. Stomach/Bowel: Proximal gastric underdistention. Surgical sutures in the rectum. Normal appendix. At least 1 enterotomy site. The more cephalad left-sided percutaneous drain traverses a contrast and gas collection on the order of 10.1 x 2.9 cm in 73/2. This is the site of a approximately 10.9 x 5.5 cm primarily gas collection on the prior exam. The more caudal and anterior percutaneous drain terminates in a somewhat ill-defined small-bowel mesenteric collection of 6.2 x 2.8 cm on 84/2. Compare 8.2 x 3.8 cm on the prior exam (when remeasured). Just caudal to this, an interloop fluid and gas collection, which may be separate from the catheter measures 8.9 x 5.3 cm 92/2 versus 9.2 by 4.9 cm on the prior exam (when remeasured). A lateral left abdominal somewhat ill-defined fluid collection with peripheral enhancement measures 9.0 x 3.0 cm and 85/2 and is more well-defined today but relatively similar in size to the prior exam. This tracks caudally along the left iliacus muscle including 90/2. Extraluminal contrast is also identified throughout the lower left abdomen and pelvis, including bone 98/2. Small right lower abdominal fluid collection of 1.9 x 4.5 cm on 85/2 is grossly similar to on the prior. Extraluminal gas and fluid both within the intraperitoneal abdomen and anterior abdominal wall. Vascular/Lymphatic: Aortic atherosclerosis. No abdominopelvic adenopathy. Reproductive: Normal uterus. Prominent gonadal veins as can be seen with pelvic congestion syndrome. Other: Anasarca. Musculoskeletal: No acute osseous abnormality. IMPRESSION: 1. Placement of 2 left-sided percutaneous drain  since 10/16/2021. Multiple residual fluid collections, as detailed above. Extensive extraluminal gas and contrast. Although this could all be secondary to prior anastomotic leak 3 days ago, ongoing anastomotic leak (especially at the site of the more cephalad, lateral percutaneous drain) remains a concern. 2. No bowel  obstruction. 3. Findings of congestive heart failure, including bilateral pleural effusions and septal thickening. 4. The plain film abnormality corresponds to increased progressive dependent left apical pulmonary opacity, favored to represent progressive pleuroparenchymal scarring since 2020. Consider chest CT follow-up in 3 months to confirm stability and exclude less likely developing mass. 5. Thoracic adenopathy which is likely related to fluid overload/congestive heart failure. This could also be re-evaluated on follow-up chest CT. 6. Hepatic steatosis. Lateral segment left liver lobe prominence, which can be seen in mild cirrhosis. Correlate with risk factors. 7. Aortic atherosclerosis (ICD10-I70.0) and emphysema (ICD10-J43.9). Electronically Signed   By: Abigail Miyamoto M.D.   On: 10/19/2021 14:22   CT IMAGE GUIDED DRAINAGE BY PERCUTANEOUS CATHETER  Result Date: 10/21/2021 INDICATION: Status post complex intra-abdominal surgery complicated by anastomotic leak and multifocal abscesses. Most recently, 2 drainage catheters were placed on 10/16/2021. Repeat imaging dated 10/19/2021 demonstrates persistent multifocal abscess cavities. Patient presents today for repositioning of the previously placed 2 drainage catheters using CT guidance as well as placement of a third catheter. EXAM: CT GUIDED DRAINAGE OF  ABSCESS MEDICATIONS: The patient is currently [facility] to [facility] and receiving intravenous antibiotics. The antibiotics were administered within an appropriate time frame prior to the initiation of the procedure. ANESTHESIA/SEDATION: Moderate (conscious) sedation was employed during this procedure. A total of Versed 3 mg and Fentanyl 100 mcg was administered intravenously by the radiology nurse. Total intra-service moderate Sedation Time: 34 minutes. The patient's level of consciousness and vital signs were monitored continuously by radiology nursing throughout the procedure under my direct supervision.  COMPLICATIONS: None immediate. TECHNIQUE: Informed written consent was obtained from the patient after a thorough discussion of the procedural risks, benefits and alternatives. All questions were addressed. Maximal Sterile Barrier Technique was utilized including caps, mask, sterile gowns, sterile gloves, sterile drape, hand hygiene and skin antiseptic. A timeout was performed prior to the initiation of the procedure. PROCEDURE: The operative field was prepped with Chlorhexidine in a sterile fashion, and a sterile drape was applied covering the operative field. A sterile gown and sterile gloves were used for the procedure. Local anesthesia was provided with 1% Lidocaine. Initial axial CT imaging was performed. The existing drainage catheters are in good position but slightly deep. The abscess collections they are affiliated with do not appear to be draining toward the catheter pigtail. Both catheters could be withdrawn by approximately 5 cm to reposition the pigtail within the residual collection. The bandages were removed. The retention sutures and adhesive fixation devices were released. Both catheters were carefully withdrawn while applying aspiration until there was return of new fluid. The catheters were then again secured in position. Planning axial CT imaging was performed and the fluid and gas collection in the central abdomen identified. A suitable skin entry site was selected allowing for safe passage of the needle between the loops of bowel. Under intermittent CT guidance, an 18 gauge trocar needle was carefully advanced into the fluid collection. A 0.035 wire was advanced into the fluid collection. The skin tract was dilated to 12 [place]. A 12 French drainage catheter was advanced over the wire and formed. There was aspiration of 80 mL of thick opaque fluid and debris. Repeat CT  imaging was performed confirming acceptable placement of all 3 drainage catheters. The catheters were Ayesha Rumpf a cured to the  skin. FINDINGS: 1. Successful reposition of existing percutaneous drainage catheters. 2. Placement of a new 12 French drainage catheter with evacuation of opaque fluid and debris. IMPRESSION: Successful reposition of existing percutaneous drainage catheters. Placement of a new 12 French drainage catheter with evacuation of opaque fluid and debris. Electronically Signed   By: Jacqulynn Cadet M.D.   On: 10/21/2021 12:02   CT IMAGE GUIDED DRAINAGE BY PERCUTANEOUS CATHETER  Result Date: 10/21/2021 INDICATION: Status post complex intra-abdominal surgery complicated by anastomotic leak and multifocal abscesses. Most recently, 2 drainage catheters were placed on 10/16/2021. Repeat imaging dated 10/19/2021 demonstrates persistent multifocal abscess cavities. Patient presents today for repositioning of the previously placed 2 drainage catheters using CT guidance as well as placement of a third catheter. EXAM: CT GUIDED DRAINAGE OF  ABSCESS MEDICATIONS: The patient is currently admitted to [facility] and receiving intravenous antibiotics. The antibiotics were administered within an appropriate time frame prior to the initiation of the procedure. ANESTHESIA/SEDATION: Moderate (conscious) sedation was employed during this procedure. A total of Versed 3 mg and Fentanyl 100 mcg was administered intravenously by the radiology nurse. Total intra-service moderate Sedation Time: 34 minutes. The patient's level of consciousness and vital signs were monitored continuously by radiology nursing throughout the procedure under my direct supervision. COMPLICATIONS: None immediate. TECHNIQUE: Informed written consent was obtained from the patient after a thorough discussion of the procedural risks, benefits and alternatives. All questions were addressed. Maximal Sterile Barrier Technique was utilized including caps, mask, sterile gowns, sterile gloves, sterile drape, hand hygiene and skin antiseptic. A timeout was performed prior to  the initiation of the procedure. PROCEDURE: The operative field was prepped with Chlorhexidine in a sterile fashion, and a sterile drape was applied covering the operative field. A sterile gown and sterile gloves were used for the procedure. Local anesthesia was provided with 1% Lidocaine. Initial axial CT imaging was performed. The existing drainage catheters are in good position but slightly deep. The abscess collections they are affiliated with do not appear to be draining toward the catheter pigtail. Both catheters could be withdrawn by approximately 5 cm to reposition the pigtail within the residual collection. The bandages were removed. The retention sutures and adhesive fixation devices were released. Both catheters were carefully withdrawn while applying aspiration until there was return of new fluid. The catheters were then again secured in position. Planning axial CT imaging was performed and the fluid and gas collection in the central abdomen identified. A suitable skin entry site was selected allowing for safe passage of the needle between the loops of bowel. Under intermittent CT guidance, an 18 gauge trocar needle was carefully advanced into the fluid collection. A 0.035 wire was advanced into the fluid collection. The skin tract was dilated to 12 [place]. A 12 French drainage catheter was advanced over the wire and formed. There was aspiration of 80 mL of thick opaque fluid and debris. Repeat CT imaging was performed confirming acceptable placement of all 3 drainage catheters. The catheters were Ayesha Rumpf a cured to the skin. FINDINGS: 1. Successful reposition of existing percutaneous drainage catheters. 2. Placement of a new 12 French drainage catheter with evacuation of opaque fluid and debris. IMPRESSION: Successful reposition of existing percutaneous drainage catheters. Placement of a new 12 French drainage catheter with evacuation of opaque fluid and debris. Electronically Signed   By: Dellis Filbert.D.  On: 10/21/2021 12:02   [place] EKG SITE RITE  Result Date: 10/20/2021 If [facility] image not attached, placement could not be confirmed due to current cardiac rhythm.   Labs:  CBC: Recent Labs    10/18/21 2348 10/19/21 1602 10/20/21 0745 10/21/21 0500  WBC 14.0* 11.6* 9.6 9.9  HGB 9.7* 9.6* 9.4* 8.6*  HCT 29.7* 29.6* 28.4* 27.0*  PLT 403* 466* 467* 485*    COAGS: Recent Labs    10/17/21 0007  INR 1.3*    BMP: Recent Labs    10/19/21 1602 10/20/21 0745 10/21/21 0500 10/22/21 0520  NA 133* 133* 135 135  K 3.3* 3.0* 3.2* 3.6  CL 103 104 108 107  CO2 22 21* 21* 22  GLUCOSE 86 91 102* 120*  BUN 12 9 7 7   CALCIUM 7.7* 7.7* 7.7* 7.9*  CREATININE 1.10* 1.16* 1.05* 0.93  GFRNONAA 59* 55* >60 >60    LIVER FUNCTION TESTS: Recent Labs    10/16/21 1230 10/22/21 0520  BILITOT 0.8 0.3  AST 22 13*  ALT 14 10  ALKPHOS 74 53  PROT 6.9 6.4*  ALBUMIN 2.7* 1.8*    Assessment and Plan: 56 y.o. female inpatient. History of  s/p. laparotomy, lysis of adhesions, excision of small bowel mass with small bowel resection and closure of incisional hernia on 2.14.23 . Presented to the ED at [facility] on 2.21.23 with abdominal pain. Found to have anastomotic leak  and multiple intra-abdominal abscesses s/p. 12 Fr left medial abscess drain and left upper quadrant abscess drain on 2.21.23 by Dr. Maryelizabeth Kaufmann and 12 Fr left lateral abscess drain and reposition of the pre-existing abscess drains by Dr. Laurence Ferrari on 2.26.23.  Cultures grew rare candida albicans, enterobacter cloacae, strep anginosis. Last recorded temperature 100.5  Drain Location: left medial  Size: Fr size: 12 Fr Date of placement: 2.21.23  Currently to: Drain collection device: gravity Output yellow  Drain Location: LUQ Size: Fr size: 12 Fr Date of placement: 2.21.23  Currently to: Drain collection device: gravity Output  purulent yellow  Drain Location: left lateral  Size: Fr size: 12 Fr Date of  placement: 2.26.23 Currently to: Drain collection device: gravity Output purulent yellow  24 hour output:  Output by Drain (mL) 10/20/21 0701 - 10/20/21 1900 10/20/21 1901 - 10/21/21 0700 10/21/21 0701 - 10/21/21 1900 10/21/21 1901 - 10/22/21 0700 10/22/21 0701 - 10/22/21 1200  Closed System Drain 1 Bulb (JP) 12 Fr.    15   Closed System Drain 2 Lateral;Anterior LLQ Bulb (JP) 14 Fr.   600 40   Closed System Drain 3 Left;Lateral Abdomen Bulb (JP)    15     Interval imaging/drain manipulation:  None since reposition and placement of left lateral drain on 2.26.23  Current examination: Per RN Flushes/aspirates easily.  Insertion site unremarkable.Left lateral drain with mild erythema. Per RN at bedside drainage around the catheter that is yellow to purulent in nature. Discussed with Dr. Maryelizabeth Kaufmann no intervention needed at this time. Suture and stat lock in place. Dressed appropriately.    Plan: Continue TID flushes with 5 cc NS. Record output Q shift. Dressing changes QD or PRN if soiled.  Call IR APP or on call IR MD if difficulty flushing or sudden change in drain output.    Discharge planning: Please contact IR APP or on call IR MD prior to patient d/c to ensure appropriate follow up plans are in place.   IR will continue to follow - please call with questions or  concerns.   Electronically Signed: Jacqualine Mau, NP 10/22/2021, 10:53 AM   I spent a total of 15 Minutes at the patient's bedside AND on the patient's [facility] floor or unit, greater than 50% of which was counseling/coordinating care for intra-abdominal abscess drains X 3.

## 2021-10-22 NOTE — Progress Notes (Signed)
Patient ambulated to chair, refuses to allow staff to put chair alarm on her to promote safety. Patient states "I feel like a prisoner in here" along with refusing teaching and other safety protocols. This RN will continue to monitor patient safety.

## 2021-10-22 NOTE — Progress Notes (Signed)
PHARMACY - TOTAL PARENTERAL NUTRITION CONSULT NOTE   Indication: Fistula  Patient Measurements: Height: 5\' 5"  (165.1 cm) Weight: 66.8 kg (147 lb 4.3 oz) IBW/kg (Calculated) : 57 TPN AdjBW (KG): 66.8 Body mass index is 24.51 kg/m. Usual Weight: 62-68 kg in past 2 yr  Assessment: 56 yoF with complicated abd surgical Hx including stercoral perforation, colostomy & subsequent takedown in 2020, recently admitted 2/14-2/17 for elective incisional hernia repair and SB resection to remove SB mass (which turned out to be a contained jejunal perforation), readmitted 2/21 with subsequent abdominal pain. Workup revealed pneumoperitoneum and concern for anastomotic leak. Two drains were placed for intra-abdominal abscesses. On POD11, noted to have persistent leakage from anastomosis on CTa/p, as well as new intra-abd fluid collection. FL diet discontinued, and Pharmacy consulted to start TPN on 2/26.  Glucose: No Hx DM; CBGs well controlled prior to starting TPN - range 117-118 since starting TPN, no SSI given Electrolytes: K & Mg at low end of goal, but improved s/p replacement yesterday; otherwise WNL including CorrCa 9.66 - Lasix x 1 noted 2/26 Renal: AKI during prior admission; SCr, BUN now back to baseline;  UOP 3325 cc/24hr Hepatic: WNL, Alb low I/O: No abd drain output 670cc;  - LBM 2/25 (small-mod liquid stool) - MIVF: NS @ 50 GI Imaging: - 2/21 CTa/p c/w anastomotic leak, peritonitis, developing abscesses; hepatic steatosis noted - 2/24 CTa/p: Extensive extraluminal gas and contrast c/w ongoing anastomotic leak, multiple residual fluid collections GI Surgeries / Procedures:  - 2/21: intra-abd drains placed x 2 in IR - 2/26: placement of 3rd abd drain; original 2 drains repositioned  Central access: PICC placed 2/25 TPN start date: 2/26  Nutritional Goals: RD recs pending  Plan:  Mag 2g IV x 1 KCl 10 mEq IV x 3  Increase TPN to 50 mL/hr at 1800 - pending RD recs, will use standard  formula with 50 g/L protein, 15% dextrose, 30 g/L lipids This will provide 60g protein, 1212kcal per day Electrolytes in TPN:  Na - 50 mEq/L K - 50 mEq/L Ca - 5 mEq/L Mg - 5 mEq/L Phos - 15 mmol/L Cl:Ac ratio - max Ac Multivitamin PO already ordered; will leave MVI/trace outside TPN d/t national MVI shortage Initiate Sensitive q6h SSI and adjust as needed  Reduce MIVF to 40 mL/hr at 1800 Monitor TPN labs on Mon/Thurs; CMET, Mg and Phos in AM Can begin transition to cyclic administration when reaches goal rate  Peggyann Juba, PharmD, BCPS Pharmacy: [phone] 10/22/2021, 6:58 AM

## 2021-10-22 NOTE — Plan of Care (Signed)
°  Problem: Clinical Measurements: Goal: Respiratory complications will improve Outcome: Progressing Goal: Cardiovascular complication will be avoided Outcome: Progressing   Problem: Clinical Measurements: Goal: Ability to maintain clinical measurements within normal limits will improve Outcome: Not Progressing Goal: Will remain free from infection Outcome: Not Progressing Goal: Diagnostic test results will improve Outcome: Not Progressing
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
## 2021-10-22 NOTE — Progress Notes (Addendum)
Progress Note: General Surgery Service   Chief Complaint/Subjective: Frustrated and wants to go home.  Doesn't want to stay here the whole time this is going on.  Upset about why we won't place a wound VAC or reoperate.  We had discussions about why both of these things are either contraindicated or not a good idea right now.    Objective: Vital signs in last 24 hours: Temp:  [98.5 F (36.9 C)-98.8 F (37.1 C)] 98.5 F (36.9 C) (02/27 0032) Pulse Rate:  [82-98] 93 (02/27 0544) Resp:  [0-23] 16 (02/27 0544) BP: (90-125)/(63-91) 125/80 (02/27 0544) SpO2:  [92 %-100 %] 95 % (02/27 0544) Last BM Date : 10/20/21  Intake/Output from previous day: 02/26 0701 - 02/27 0700 In: 0973.5 [P.O.:637; I.V.:2040.7; IV Piggyback:996.5] Out: 3299 [Urine:3325; Drains:670] Intake/Output this shift: Total I/O In: 553 [I.V.:553] Out: -   GI: Abd inferior aspect of incision with significant drainage, Eakin's pouch in place 625cc/24 hrs.  IR drains left abdomen output drain 1 15cc, drain 3 15cc, drain 2 640cc.  All drains with tan colored output.    Lab Results: CBC  Recent Labs    10/20/21 0745 10/21/21 0500  WBC 9.6 9.9  HGB 9.4* 8.6*  HCT 28.4* 27.0*  PLT 467* 485*   BMET Recent Labs    10/21/21 0500 10/22/21 0520  NA 135 135  K 3.2* 3.6  CL 108 107  CO2 21* 22  GLUCOSE 102* 120*  BUN 7 7  CREATININE 1.05* 0.93  CALCIUM 7.7* 7.9*   PT/INR No results for input(s): LABPROT, INR in the last 72 hours. ABG No results for input(s): PHART, HCO3 in the last 72 hours.  Invalid input(s): PCO2, PO2  Anti-infectives: Anti-infectives (From admission, onward)    Start     Dose/Rate Route Frequency Ordered Stop   10/20/21 1415  metroNIDAZOLE (FLAGYL) IVPB 500 mg        500 mg 100 mL/hr over 60 Minutes Intravenous 2 times daily 10/20/21 1324     10/20/21 1400  ceFEPIme (MAXIPIME) 2 g in sodium chloride 0.9 % 100 mL IVPB        2 g 200 mL/hr over 30 Minutes Intravenous 2 times daily  10/20/21 1324     10/18/21 1000  fluconazole (DIFLUCAN) IVPB 400 mg        400 mg 100 mL/hr over 120 Minutes Intravenous Every 24 hours 10/18/21 0740 11/01/21 0959   10/16/21 2200  piperacillin-tazobactam (ZOSYN) IVPB 3.375 g  Status:  Discontinued        3.375 g 12.5 mL/hr over 240 Minutes Intravenous Every 8 hours 10/16/21 1524 10/20/21 1324   10/16/21 1500  piperacillin-tazobactam (ZOSYN) IVPB 3.375 g        3.375 g 100 mL/hr over 30 Minutes Intravenous  Once 10/16/21 1455 10/16/21 1552       Medications: Scheduled Meds:  acetaminophen  650 mg Oral Q6H   amiodarone  400 mg Oral BID   Chlorhexidine Gluconate Cloth  6 each Topical Daily   enoxaparin (LOVENOX) injection  70 mg Subcutaneous Q12H   feeding supplement  1 Container Oral BID BM   insulin aspart  0-9 Units Subcutaneous Q8H   levothyroxine  25 mcg Oral Q0600   loratadine  10 mg Oral Daily   mouth rinse  15 mL Mouth Rinse BID   multivitamin with minerals  1 tablet Oral Daily   pantoprazole  40 mg Oral Daily   sodium chloride flush  10 mL Intracatheter Q12H  sodium chloride flush  10-40 mL Intracatheter Q12H   sodium chloride flush  5 mL Intracatheter Q8H   vortioxetine HBr  10 mg Oral Daily   Continuous Infusions:  sodium chloride Stopped (10/20/21 1544)   sodium chloride     sodium chloride 50 mL/hr at 10/22/21 0713   sodium chloride     ceFEPime (MAXIPIME) IV Stopped (10/21/21 2201)   fluconazole (DIFLUCAN) IV Stopped (10/21/21 1519)   magnesium sulfate bolus IVPB 2 g (10/22/21 0828)   methocarbamol (ROBAXIN) IV 500 mg (10/22/21 0423)   metronidazole Stopped (10/21/21 2234)   potassium chloride     TPN ADULT (ION) 40 mL/hr at 10/22/21 0713   TPN ADULT (ION)     PRN Meds:.sodium chloride, sodium chloride, HYDROmorphone (DILAUDID) injection, lip balm, methocarbamol (ROBAXIN) IV, morphine, ondansetron (ZOFRAN) IV, prochlorperazine, simethicone, sodium chloride flush  Assessment/Plan: Julie Williams is a 56 year  old female s/p open small bowel resection, primary closure of epigastric hernia and 1 hour lysis of adhesions on 10/09/21.  She presented 2/21 with concern for an anastomotic leak on imaging and two left sided IR drains were placed.  -CLD for comfort from floor. -JP drain studies consistent with anastomotic leak -discussed why VAC and reoperation were not in her best interest -discussed that it is possible she does not need to be here in the hospital the whole time waiting for her fistula to heal.  Many times we are able to stabilize the patient and cycle their TNA so they can do this stuff at home; however, we discussed that that takes time to be able to get those things accomplished.   -cx - rare candida albicans, enterobacter cloacae, strep anginosis  New onset a. Fib with RVR - cardiology evaluation appreciated, on amiodarone oral, on therapeutic lovenox Transition to eliquis as soon as safe - depending on CT scan results AKI - improved since recent hospital stay Hypokalemia - K 3.6 this am Pain/nausea control Increase activity    LOS: 6 days   FEN: NPO, except sips and chips, TNA ID: [email or web address]/[email or web address], fluconazole  VTE: Lovenox, scds Foley: None Dispo: Continued care in stepdown    Henreitta Cea, [facility] Use [email or web address] to contact on call provider  Daily Billing:

## 2021-10-22 NOTE — Progress Notes (Signed)
Patient ID: Julie Williams, female   DOB: 1965-11-02, 57 y.o.   MRN: [id number] Asked to see the patient for sepsis and mottling of lower extremities. Wbc normal earlier today. SBP 130. HR 95. Possible mottling of thighs but extremities are warm. Drains intact. Will check cbc, cmet, lactate, and CT.

## 2021-10-23 LAB — COMPREHENSIVE METABOLIC PANEL
ALT: 8 U/L (ref 0–44)
AST: 13 U/L — ABNORMAL LOW (ref 15–41)
Albumin: 1.7 g/dL — ABNORMAL LOW (ref 3.5–5.0)
Alkaline Phosphatase: 52 U/L (ref 38–126)
Anion gap: 4 — ABNORMAL LOW (ref 5–15)
BUN: 9 mg/dL (ref 6–20)
CO2: 25 mmol/L (ref 22–32)
Calcium: 8 mg/dL — ABNORMAL LOW (ref 8.9–10.3)
Chloride: 107 mmol/L (ref 98–111)
Creatinine, Ser: 0.8 mg/dL (ref 0.44–1.00)
GFR, Estimated: >60 mL/min (ref >60)
Glucose, Bld: 137 mg/dL — ABNORMAL HIGH (ref 70–99)
Potassium: 3.9 mmol/L (ref 3.5–5.1)
Sodium: 136 mmol/L (ref 135–145)
Total Bilirubin: 0.2 mg/dL — ABNORMAL LOW (ref 0.3–1.2)
Total Protein: 6.5 g/dL (ref 6.5–8.1)

## 2021-10-23 LAB — GLUCOSE, CAPILLARY
Glucose-Capillary: 131 mg/dL — ABNORMAL HIGH (ref 70–99)
Glucose-Capillary: 69 mg/dL — ABNORMAL LOW (ref 70–99)
Glucose-Capillary: 78 mg/dL (ref 70–99)

## 2021-10-23 LAB — MAGNESIUM: Magnesium: 2.1 mg/dL (ref 1.7–2.4)

## 2021-10-23 LAB — PHOSPHORUS: Phosphorus: 6.1 mg/dL — ABNORMAL HIGH (ref 2.5–4.6)

## 2021-10-23 MED ORDER — TRAVASOL 10 % IV SOLN: INTRAVENOUS | Status: DC

## 2021-10-23 MED ORDER — FLUCONAZOLE 100 MG PO TABS
400.0000 mg | ORAL_TABLET | Freq: Every day | ORAL | Status: DC
  Administered 2021-10-23: 400 mg via ORAL
  Filled 2021-10-23: qty 4

## 2021-10-23 MED ORDER — APIXABAN 5 MG PO TABS
5.0000 mg | ORAL_TABLET | Freq: Two times a day (BID) | ORAL | Status: DC
  Administered 2021-10-23: 5 mg via ORAL
  Filled 2021-10-23: qty 1

## 2021-10-23 MED ORDER — CIPROFLOXACIN HCL 500 MG PO TABS
500.0000 mg | ORAL_TABLET | Freq: Two times a day (BID) | ORAL | Status: DC
  Administered 2021-10-23: 500 mg via ORAL
  Filled 2021-10-23: qty 1

## 2021-10-23 MED ORDER — SODIUM CHLORIDE 0.9 % IV SOLN: INTRAVENOUS | Status: DC

## 2021-10-23 MED ORDER — BOOST / RESOURCE BREEZE PO LIQD CUSTOM
1.0000 | Freq: Three times a day (TID) | ORAL | Status: DC
  Administered 2021-10-23: 1 via ORAL

## 2021-10-23 MED ORDER — METRONIDAZOLE 500 MG PO TABS
500.0000 mg | ORAL_TABLET | Freq: Three times a day (TID) | ORAL | Status: DC
  Administered 2021-10-23 – 2021-10-24 (×3): 500 mg via ORAL
  Filled 2021-10-23 (×3): qty 1

## 2021-10-23 MED ORDER — SODIUM CHLORIDE 0.9 % IV SOLN
2.0000 g | Freq: Three times a day (TID) | INTRAVENOUS | Status: DC
  Administered 2021-10-23: 2 g via INTRAVENOUS
  Filled 2021-10-23: qty 2

## 2021-10-23 NOTE — Progress Notes (Signed)
Referring Physician(s): Stechschulte,P  Supervising Physician: Michaelle Birks  Patient Status:  [facility] - In-pt  Chief Complaint: Abdominal pain/fluid collections   Subjective: Pt currently on bedside commode; states she will be going home soon; no new c/o; temp 100   Allergies: Bee venom  Medications: Prior to Admission medications   Medication Sig Start Date End Date Taking? Authorizing Provider  amLODipine (NORVASC) 10 MG tablet Take 10 mg by mouth daily.   Yes [provider]  Biotin w/ Vitamins C & E (HAIR/SKIN/NAILS PO) Take 1 tablet by mouth daily.   Yes [provider]  Cholecalciferol (VITAMIN D3) 250 MCG (10000 UT) TABS Take 10,000 mcg by mouth daily.   Yes [provider]  EPINEPHrine 0.3 mg/0.3 mL IJ SOAJ injection Inject 0.3 mg into the muscle as needed for anaphylaxis. 05/12/21  Yes Sponseller, Gypsy Balsam, PA-C  levothyroxine (SYNTHROID) 25 MCG tablet Take 1 tablet (25 mcg total) by mouth daily before breakfast. 05/08/21  Yes Aslam, Sadia, MD  morphine (MSIR) 30 MG tablet Take 1 tablet (30 mg total) by mouth every 6 (six) hours as needed for severe pain. Patient taking differently: Take 30 mg by mouth every 5 (five) hours as needed for severe pain. 04/24/21  Yes Aslam, Loralyn Freshwater, MD  naloxone (NARCAN) nasal spray 4 mg/0.1 mL 1 spray as needed (overdose). 05/22/21  Yes [provider]  omeprazole (PRILOSEC) 40 MG capsule TAKE 1 CAPSULE BY MOUTH EVERY DAY 09/28/20  Yes Katsadouros, Vasilios, MD  polyethylene glycol (MIRALAX / GLYCOLAX) 17 g packet Take 17 g by mouth daily. Patient taking differently: Take 17 g by mouth daily as needed for moderate constipation. 35/36/14  Yes Leighton Ruff, MD  vortioxetine HBr (TRINTELLIX) 10 MG TABS tablet Take 1 tablet (10 mg total) by mouth daily. 01/18/21  Yes Katsadouros, Vasilios, MD     Vital Signs: BP 127/78 (BP Location: Right Arm)    Pulse (!) 25    Temp 100 F (37.8 C) (Oral)    Resp 20    Ht 5\' 5"   (1.651 m)    Wt 147 lb 4.3 oz (66.8 kg)    SpO2 96%    BMI 24.51 kg/m   Physical Exam awake/alert;  3 left abd drains in place; OP range from 20-45 cc; also with ant ostomy bag draining 825 cc  Imaging: CT ABDOMEN PELVIS WO CONTRAST  Result Date: 10/22/2021 CLINICAL DATA:  Abdominal pain, post-op. Multiple hepatic abscesses status post percutaneous drainage. EXAM: CT ABDOMEN AND PELVIS WITHOUT CONTRAST TECHNIQUE: Multidetector CT imaging of the abdomen and pelvis was performed following the standard protocol without IV contrast. RADIATION DOSE REDUCTION: This exam was performed according to the departmental dose-optimization program which includes automated exposure control, adjustment of the mA and/or kV according to patient size and/or use of iterative reconstruction technique. COMPARISON:  10/19/2021 FINDINGS: Lower chest: Interlobular septal thickening within the lung bases, progressive since prior examination, suggest developing pulmonary edema. Small bilateral pleural effusions are again identified. There is hypoattenuation of the cardiac blood pool in keeping with at least moderate anemia. Central venous catheter tip noted within the superior right atrium. Cardiac size within normal limits. Hepatobiliary: No focal liver abnormality is seen. No gallstones, gallbladder wall thickening, or biliary dilatation. Pancreas: Unremarkable Spleen: Unremarkable Adrenals/Urinary Tract: Adrenal glands are unremarkable. Kidneys are normal, without renal calculi, focal lesion, or hydronephrosis. Bladder is unremarkable. Stomach/Bowel: Surgical changes of partial small bowel resection are again identified with anastomotic staple lines within the right lower  quadrant and left upper quadrant. Since the prior examination, a left mid abdominal percutaneous drainage catheter has been placed into the previously undrained mid abdominal gas and fluid collection which now appears completely evacuated. Left upper quadrant  percutaneous drainage catheter posterior to the distal body of the stomach has been slightly withdrawn and surrounding fluid collection along the posterior wall of the stomach appears decreased in size, measuring 2.1 x 7.3 cm at axial image # 40/2. Peripheral left mid abdominal drainage catheter has been withdrawn from its mid abdominal position to a lateral position immediately posterior to the splenic flexure of the colon. The catheter remains position within a a gas and debris filled cavity within the left anterior pararenal space which demonstrates decreasing intraluminal debris when compared to prior examination. This residual cavity measures at least 2.4 x 7.4 cm, grossly unchanged from prior examination. Ill-defined fluid collection previously noted to contain high attenuation fluid suggestive of a anastomotic leak appears relatively stable, measuring 2.4 x 5.6 cm at axial image # 55/2 within the left mid abdomen and possibly communicating with the midline abdominal wound, best seen on image # 60/2. This is unchanged. Ill-defined fluid within the left posterior pararenal space extending into the left iliac fossa is unchanged No new intra-abdominal fluid collections are identified. Vascular/Lymphatic: Minimal aortoiliac atherosclerotic calcification. No aortic aneurysm. No pathologic adenopathy within the abdomen and pelvis. Extensive shotty mesenteric and retroperitoneal adenopathy is likely reactive in nature, unchanged. Reproductive: Uterus and bilateral adnexa are unremarkable. Other: Moderate subcutaneous edema is identified within the flanks bilaterally, stable since prior examination Musculoskeletal: Degenerative changes are seen within the lumbar spine. IMPRESSION: Moderate anemia. Progressive interlobular septal thickening within the visualized lung bases suggesting progressive mild pulmonary edema. Stable small bilateral pleural effusions and body wall subcutaneous edema likely reflecting changes of  anasarca. Interval percutaneous drainage of mid abdominal abscess and repositioning existing left upper quadrant left mid abdominal drainage catheters with interval improvement in loculated fluid collections throughout the abdomen as described above. Stable ill-defined fluid within the left posterior pararenal space extending into the left iliac fossa. Stable anterior left mid abdominal fluid collection previously determined to represent an anastomotic leak collection likely communicating with the midline abdominal wound. Aortic Atherosclerosis (ICD10-I70.0). Electronically Signed   By: Fidela Salisbury M.D.   On: 10/22/2021 21:04   CT IMAGE GUIDED DRAINAGE BY PERCUTANEOUS CATHETER  Result Date: 10/21/2021 INDICATION: Status post complex intra-abdominal surgery complicated by anastomotic leak and multifocal abscesses. Most recently, 2 drainage catheters were placed on 10/16/2021. Repeat imaging dated 10/19/2021 demonstrates persistent multifocal abscess cavities. Patient presents today for repositioning of the previously placed 2 drainage catheters using CT guidance as well as placement of a third catheter. EXAM: CT GUIDED DRAINAGE OF  ABSCESS MEDICATIONS: The patient is currently admitted to the hospital and receiving intravenous antibiotics. The antibiotics were administered within an appropriate time frame prior to the initiation of the procedure. ANESTHESIA/SEDATION: Moderate (conscious) sedation was employed during this procedure. A total of Versed 3 mg and Fentanyl 100 mcg was administered intravenously by the radiology nurse. Total intra-service moderate Sedation Time: 34 minutes. The patient's level of consciousness and vital signs were monitored continuously by radiology nursing throughout the procedure under my direct supervision. COMPLICATIONS: None immediate. TECHNIQUE: Informed written consent was obtained from the patient after a thorough discussion of the procedural risks, benefits and alternatives.  All questions were addressed. Maximal Sterile Barrier Technique was utilized including caps, mask, sterile gowns, sterile gloves, sterile drape, hand hygiene and  skin antiseptic. A timeout was performed prior to the initiation of the procedure. PROCEDURE: The operative field was prepped with Chlorhexidine in a sterile fashion, and a sterile drape was applied covering the operative field. A sterile gown and sterile gloves were used for the procedure. Local anesthesia was provided with 1% Lidocaine. Initial axial CT imaging was performed. The existing drainage catheters are in good position but slightly deep. The abscess collections they are affiliated with do not appear to be draining toward the catheter pigtail. Both catheters could be withdrawn by approximately 5 cm to reposition the pigtail within the residual collection. The bandages were removed. The retention sutures and adhesive fixation devices were released. Both catheters were carefully withdrawn while applying aspiration until there was return of new fluid. The catheters were then again secured in position. Planning axial CT imaging was performed and the fluid and gas collection in the central abdomen identified. A suitable skin entry site was selected allowing for safe passage of the needle between the loops of bowel. Under intermittent CT guidance, an 18 gauge trocar needle was carefully advanced into the fluid collection. A 0.035 wire was advanced into the fluid collection. The skin tract was dilated to 12 [place]. A 12 French drainage catheter was advanced over the wire and formed. There was aspiration of 80 mL of thick opaque fluid and debris. Repeat CT imaging was performed confirming acceptable placement of all 3 drainage catheters. The catheters were Ayesha Rumpf a cured to the skin. FINDINGS: 1. Successful reposition of existing percutaneous drainage catheters. 2. Placement of a new 12 French drainage catheter with evacuation of opaque fluid and debris.  IMPRESSION: Successful reposition of existing percutaneous drainage catheters. Placement of a new 12 French drainage catheter with evacuation of opaque fluid and debris. Electronically Signed   By: Jacqulynn Cadet M.D.   On: 10/21/2021 12:02   CT IMAGE GUIDED DRAINAGE BY PERCUTANEOUS CATHETER  Result Date: 10/21/2021 INDICATION: Status post complex intra-abdominal surgery complicated by anastomotic leak and multifocal abscesses. Most recently, 2 drainage catheters were placed on 10/16/2021. Repeat imaging dated 10/19/2021 demonstrates persistent multifocal abscess cavities. Patient presents today for repositioning of the previously placed 2 drainage catheters using CT guidance as well as placement of a third catheter. EXAM: CT GUIDED DRAINAGE OF  ABSCESS MEDICATIONS: The patient is currently admitted to the hospital and receiving intravenous antibiotics. The antibiotics were administered within an appropriate time frame prior to the initiation of the procedure. ANESTHESIA/SEDATION: Moderate (conscious) sedation was employed during this procedure. A total of Versed 3 mg and Fentanyl 100 mcg was administered intravenously by the radiology nurse. Total intra-service moderate Sedation Time: 34 minutes. The patient's level of consciousness and vital signs were monitored continuously by radiology nursing throughout the procedure under my direct supervision. COMPLICATIONS: None immediate. TECHNIQUE: Informed written consent was obtained from the patient after a thorough discussion of the procedural risks, benefits and alternatives. All questions were addressed. Maximal Sterile Barrier Technique was utilized including caps, mask, sterile gowns, sterile gloves, sterile drape, hand hygiene and skin antiseptic. A timeout was performed prior to the initiation of the procedure. PROCEDURE: The operative field was prepped with Chlorhexidine in a sterile fashion, and a sterile drape was applied covering the operative field. A  sterile gown and sterile gloves were used for the procedure. Local anesthesia was provided with 1% Lidocaine. Initial axial CT imaging was performed. The existing drainage catheters are in good position but slightly deep. The abscess collections they are affiliated with do not  appear to be draining toward the catheter pigtail. Both catheters could be withdrawn by approximately 5 cm to reposition the pigtail within the residual collection. The bandages were removed. The retention sutures and adhesive fixation devices were released. Both catheters were carefully withdrawn while applying aspiration until there was return of new fluid. The catheters were then again secured in position. Planning axial CT imaging was performed and the fluid and gas collection in the central abdomen identified. A suitable skin entry site was selected allowing for safe passage of the needle between the loops of bowel. Under intermittent CT guidance, an 18 gauge trocar needle was carefully advanced into the fluid collection. A 0.035 wire was advanced into the fluid collection. The skin tract was dilated to 12 [place]. A 12 French drainage catheter was advanced over the wire and formed. There was aspiration of 80 mL of thick opaque fluid and debris. Repeat CT imaging was performed confirming acceptable placement of all 3 drainage catheters. The catheters were Ayesha Rumpf a cured to the skin. FINDINGS: 1. Successful reposition of existing percutaneous drainage catheters. 2. Placement of a new 12 French drainage catheter with evacuation of opaque fluid and debris. IMPRESSION: Successful reposition of existing percutaneous drainage catheters. Placement of a new 12 French drainage catheter with evacuation of opaque fluid and debris. Electronically Signed   By: Jacqulynn Cadet M.D.   On: 10/21/2021 12:02   [place] EKG SITE RITE  Result Date: 10/20/2021 [employer] image not attached, placement could not be confirmed due to current cardiac rhythm.    Labs:  CBC: Recent Labs    10/19/21 1602 10/20/21 0745 10/21/21 0500 10/22/21 1801  WBC 11.6* 9.6 9.9 10.8*  HGB 9.6* 9.4* 8.6* 8.7*  HCT 29.6* 28.4* 27.0* 27.3*  PLT 466* 467* 485* 495*    COAGS: Recent Labs    10/17/21 0007  INR 1.3*    BMP: Recent Labs    10/21/21 0500 10/22/21 0520 10/22/21 1801 10/23/21 0500  NA 135 135 134* 136  K 3.2* 3.6 4.1 3.9  CL 108 107 107 107  CO2 21* 22 22 25   GLUCOSE 102* 120* 103* 137*  BUN 7 7 8 9   CALCIUM 7.7* 7.9* 7.9* 8.0*  CREATININE 1.05* 0.93 0.93 0.80  GFRNONAA >60 >60 >60 >60    LIVER FUNCTION TESTS: Recent Labs    10/16/21 1230 10/22/21 0520 10/22/21 1801 10/23/21 0500  BILITOT 0.8 0.3 0.5 0.2*  AST 22 13* 14* 13*  ALT 14 10 9 8   ALKPHOS 74 53 56 52  PROT 6.9 6.4* 6.8 6.5  ALBUMIN 2.7* 1.8* 1.9* 1.7*    Assessment and Plan: Pt s/p exploratory laparotomy, lysis of adhesions, excision of small bowel mass, and primary closure of epigastric incisional hernia 10/09/2021 .  Pathology showed no evidence of malignancy. Now with post op abd fluid collections, s/p left abd drain placements (12 and 14 fr to JP bulb) on 2/21; on 2/26 underwent- 1. Successful reposition of existing percutaneous drainage catheters. 2. Placement of a new 12 French drainage catheter with evacuation of opaque fluid and debris   F/u CT A/P yesterday:     Progressive interlobular septal thickening within the visualized lung bases suggesting progressive mild pulmonary edema. Stable small bilateral pleural effusions and body wall subcutaneous edema likely reflecting changes of anasarca.   Interval percutaneous drainage of mid abdominal abscess and repositioning existing left upper quadrant left mid abdominal drainage catheters with interval improvement in loculated fluid collections throughout the abdomen as described  above.   Stable ill-defined fluid within the left posterior pararenal space extending into the left iliac fossa.  Stable anterior left mid abdominal fluid collection previously determined to represent an anastomotic leak collection likely communicating with the midline abdominal wound.   Aortic Atherosclerosis   Cont current tx; will schedule f/u in IR drain clinic for pt 7-10 days post hospital discharge; ideally should flush abd drains once daily as output, record outputs and change gauze dressings every 2-3 days; call [phone] for any drain related questions         Electronically Signed: D. Rowe Robert, PA-C 10/23/2021, 4:01 PM   I spent a total of 15 Minutes at the the patient's bedside AND on the patient's hospital floor or unit, greater than 50% of which was counseling/coordinating care for left abdominal abscess drains    Patient ID: Julie Williams, female   DOB: 18-Aug-1966, 56 y.o.   MRN: [id number]

## 2021-10-23 NOTE — Progress Notes (Addendum)
Progress Note: General Surgery Service   Chief Complaint/Subjective: Demanding to stop TPN - not in line with her goals of care, stated in her will.    Objective: Vital signs in last 24 hours: Temp:  [98 F (36.7 C)-100.5 F (38.1 C)] 98 F (36.7 C) (02/28 0400) Pulse Rate:  [26-115] 107 (02/28 0600) Resp:  [11-25] 20 (02/28 0600) BP: (127-149)/(76-93) 127/78 (02/28 0400) SpO2:  [85 %-100 %] 94 % (02/28 0600) Last BM Date : 10/22/21  Intake/Output from previous day: 02/27 0701 - 02/28 0700 In: 3369.6 [I.V.:2296.8; IV Piggyback:1057.8] Out: 2700 [Urine:1775; Drains:925] Intake/Output this shift: No intake/output data recorded.  GI: Abd inferior aspect of incision with significant drainage.  IR drains left abdomen without much output.   Lower extremities: Edema present, palpable pulses, no signs of ischemia   Lab Results: CBC  Recent Labs    10/21/21 0500 10/22/21 1801  WBC 9.9 10.8*  HGB 8.6* 8.7*  HCT 27.0* 27.3*  PLT 485* 495*    BMET Recent Labs    10/22/21 1801 10/23/21 0500  NA 134* 136  K 4.1 3.9  CL 107 107  CO2 22 25  GLUCOSE 103* 137*  BUN 8 9  CREATININE 0.93 0.80  CALCIUM 7.9* 8.0*    PT/INR No results for input(s): LABPROT, INR in the last 72 hours.  ABG No results for input(s): PHART, HCO3 in the last 72 hours.  Invalid input(s): PCO2, PO2  Anti-infectives: Anti-infectives (From admission, onward)    Start     Dose/Rate Route Frequency Ordered Stop   10/23/21 1000  ceFEPIme (MAXIPIME) 2 g in sodium chloride 0.9 % 100 mL IVPB        2 g 200 mL/hr over 30 Minutes Intravenous Every 8 hours 10/23/21 0726     10/20/21 1415  metroNIDAZOLE (FLAGYL) IVPB 500 mg        500 mg 100 mL/hr over 60 Minutes Intravenous 2 times daily 10/20/21 1324     10/20/21 1400  ceFEPIme (MAXIPIME) 2 g in sodium chloride 0.9 % 100 mL IVPB  Status:  Discontinued        2 g 200 mL/hr over 30 Minutes Intravenous 2 times daily 10/20/21 1324 10/23/21 0726    10/18/21 1000  fluconazole (DIFLUCAN) IVPB 400 mg        400 mg 100 mL/hr over 120 Minutes Intravenous Every 24 hours 10/18/21 0740 11/01/21 0959   10/16/21 2200  piperacillin-tazobactam (ZOSYN) IVPB 3.375 g  Status:  Discontinued        3.375 g 12.5 mL/hr over 240 Minutes Intravenous Every 8 hours 10/16/21 1524 10/20/21 1324   10/16/21 1500  piperacillin-tazobactam (ZOSYN) IVPB 3.375 g        3.375 g 100 mL/hr over 30 Minutes Intravenous  Once 10/16/21 1455 10/16/21 1552       Medications: Scheduled Meds:  acetaminophen  650 mg Oral Q6H   amiodarone  400 mg Oral BID   Chlorhexidine Gluconate Cloth  6 each Topical Daily   enoxaparin (LOVENOX) injection  70 mg Subcutaneous Q12H   feeding supplement  1 Container Oral TID BM   levothyroxine  25 mcg Oral Q0600   loratadine  10 mg Oral Daily   mouth rinse  15 mL Mouth Rinse BID   multivitamin with minerals  1 tablet Oral Daily   pantoprazole  40 mg Oral Daily   sodium chloride flush  10 mL Intracatheter Q12H   sodium chloride flush  10-40 mL Intracatheter Q12H  sodium chloride flush  5 mL Intracatheter Q8H   vortioxetine HBr  10 mg Oral Daily   Continuous Infusions:  sodium chloride Stopped (10/20/21 1544)   sodium chloride     sodium chloride 40 mL/hr at 10/23/21 0300   sodium chloride     ceFEPime (MAXIPIME) IV     fluconazole (DIFLUCAN) IV 100 mL/hr at 10/23/21 0300   methocarbamol (ROBAXIN) IV Stopped (10/22/21 2122)   metronidazole Stopped (10/22/21 2301)   PRN Meds:.sodium chloride, sodium chloride, HYDROmorphone (DILAUDID) injection, lip balm, methocarbamol (ROBAXIN) IV, morphine, ondansetron (ZOFRAN) IV, prochlorperazine, simethicone, sodium chloride flush  Assessment/Plan: Julie Williams is a 56 year old female s/p open small bowel resection, primary closure of epigastric hernia and 1 hour lysis of adhesions on 10/09/21.  She presented 2/21 with concern for an anastomotic leak on imaging and two left sided IR drains were  placed.  Ms. Gwynne continues to be very outspoken and difficult with the staff.  She was placed on TPN over the weekend.  She is very angry and says that IV nutrition does not align with her goals of care or her wishes.  She says she would not have consented to this if she knew what it was.  We had a pointed discussion of the care of fistulas.  We discussed that she is in the stabilization phase and I am trying to determine the output of the fistula, low or high output.  A lot of the drainage is likely fluid that has been built up even before she presented to the hospital, so it is difficult to tell at this point what the daily fistula output is.  There are some reassuring signs that this may not be a complete disruption of the anastomosis as contrast made it past the anastomosis on her CT on Friday.  Her overall output has also been less than a liter a day which is reassuring, however I am not sure what her daily output will be in the days and weeks to come.  There are some signs that she does not tolerate TPN well with anasarca on recent CT, swelling in her ankles and signs of fluid retention.  When she was on a full liquid diet she did not show signs of worsening sepsis.  The fistula appears controlled by drain out the lower aspect of the incision.  The drainage does not seem to prefer the IR drains, however it may find its way to the IR drains in the future.  I explained I like to ideally keep her in the hospital until we better understand the output of the fistula and then make more definitive plans on nutrition, volume status, antibiotic, and wound care plans.  She says she wants to leave the hospital tod by tomorrow at 10 AM.  I explained my concerns that without adequate nutrition this fistula could become a larger problem with time and could become life-threatening.  She voiced understanding of this and stated she still wanted to leave the hospital because she feels like a prisoner.  Today we will try  full liquid diet with supplements, we will continue to monitor the drain outputs, we will stop her TPN due to her wishes but also because I am concerned about fluid overload.  I will work on preparing her for discharge tomorrow.  I will attempt to care for her as best I can as an outpatient, but she understands the risks involved with directing her own care.  New onset a. Fib with  RVR - cardiology evaluation appreciated, on amiodarone ggt, on lovenox transition to eliquis at discharge per cardiology recommendations AKI - resolved improved since recent hospital stay Hypokalemia - resolved  Pain/nausea control Increase activity Ostomy appliance for fistula drainage   LOS: 7 days   FEN: FLD with supplements ID: Cefepime/flagyl, fluconazole  VTE: lovenox, scds Foley: None Dispo: Continued care in stepdown    Felicie Morn, MD  [facility] Use [email or web address] to contact on call provider  Daily Billing:

## 2021-10-23 NOTE — Discharge Instructions (Addendum)
Information on my medicine - ELIQUIS (apixaban)  Why was Eliquis prescribed for you? Eliquis was prescribed for you to reduce the risk of a blood clot forming that can cause a stroke if you have a medical condition called atrial fibrillation (a type of irregular heartbeat).  What do You need to know about Eliquis ? Take your Eliquis TWICE DAILY - one tablet in the morning and one tablet in the evening with or without food. If you have difficulty swallowing the tablet whole please discuss with your pharmacist how to take the medication safely.  Take Eliquis exactly as prescribed by your doctor and DO NOT stop taking Eliquis without talking to the doctor who prescribed the medication.  Stopping may increase your risk of developing a stroke.  Refill your prescription before you run out.  After discharge, you should have regular check-up appointments with your healthcare provider that is prescribing your Eliquis.  In the future your dose may need to be changed if your kidney function or weight changes by a significant amount or as you get older.  What do you do if you miss a dose? If you miss a dose, take it as soon as you remember on the same day and resume taking twice daily.  Do not take more than one dose of ELIQUIS at the same time to make up a missed dose.  Important Safety Information A possible side effect of Eliquis is bleeding. You should call your healthcare provider right away if you experience any of the following: Bleeding from an injury or your nose that does not stop. Unusual colored urine (red or dark brown) or unusual colored stools (red or black). Unusual bruising for unknown reasons. A serious fall or if you hit your head (even if there is no bleeding).  Some medicines may interact with Eliquis and might increase your risk of bleeding or clotting while on Eliquis. To help avoid this, consult your healthcare provider or pharmacist prior to using any new prescription or  non-prescription medications, including herbals, vitamins, non-steroidal anti-inflammatory drugs (NSAIDs) and supplements.  This website has more information on Eliquis (apixaban): http://www.eliquis.com/eliquis/home    General Surgery discharge instructions: - Record amounts draining from each drainage tube every day. - Stay hydrated.  Your urine should look light yellow. - Take in at least 60 grams of protein per day.  Take three protein shakes per day to ensure adequate protein intake to allow for healing. - Complete your antibiotics.  Do not skip a dose.  The fluconazole treats yeast and the ciprofloxacin and flagyl treat bacteria. - Follow up with IR drain clinic for drain removal. - Follow up with your cardiologist within 2 weeks of discharge.  You were diagnosed with atrial fibrillation  - an abnormal heart rhythm - during your hospital stay and were started on amiodarone to treat this rhythm and Eliquis, a blood thinner, to prevent clotting issues. - Increase activity daily.  - Will schedule follow up in interventional radiology drain clinic for pt 7-10 days post hospital discharge; ideally should flush drains once daily as output, record outputs and change gauze dressings every 2-3 days; call [phone] for any drain related questions

## 2021-10-23 NOTE — Plan of Care (Signed)

## 2021-10-24 LAB — CBC
HCT: 26.5 % — ABNORMAL LOW (ref 36.0–46.0)
Hemoglobin: 8.5 g/dL — ABNORMAL LOW (ref 12.0–15.0)
MCH: 32.4 pg (ref 26.0–34.0)
MCHC: 32.1 g/dL (ref 30.0–36.0)
MCV: 101.1 fL — ABNORMAL HIGH (ref 80.0–100.0)
Platelets: 514 10*3/uL — ABNORMAL HIGH (ref 150–400)
RBC: 2.62 MIL/uL — ABNORMAL LOW (ref 3.87–5.11)
RDW: 13.9 % (ref 11.5–15.5)
WBC: 11.1 10*3/uL — ABNORMAL HIGH (ref 4.0–10.5)
nRBC: 0 % (ref 0.0–0.2)

## 2021-10-24 LAB — COMPREHENSIVE METABOLIC PANEL
ALT: 8 U/L (ref 0–44)
AST: 11 U/L — ABNORMAL LOW (ref 15–41)
Albumin: 1.7 g/dL — ABNORMAL LOW (ref 3.5–5.0)
Alkaline Phosphatase: 50 U/L (ref 38–126)
Anion gap: 6 (ref 5–15)
BUN: 9 mg/dL (ref 6–20)
CO2: 25 mmol/L (ref 22–32)
Calcium: 7.9 mg/dL — ABNORMAL LOW (ref 8.9–10.3)
Chloride: 104 mmol/L (ref 98–111)
Creatinine, Ser: 0.75 mg/dL (ref 0.44–1.00)
GFR, Estimated: >60 mL/min (ref >60)
Glucose, Bld: 80 mg/dL (ref 70–99)
Potassium: 4.5 mmol/L (ref 3.5–5.1)
Sodium: 135 mmol/L (ref 135–145)
Total Bilirubin: 0.4 mg/dL (ref 0.3–1.2)
Total Protein: 6.8 g/dL (ref 6.5–8.1)

## 2021-10-24 LAB — PHOSPHORUS: Phosphorus: 4.4 mg/dL (ref 2.5–4.6)

## 2021-10-24 LAB — MAGNESIUM: Magnesium: 1.6 mg/dL — ABNORMAL LOW (ref 1.7–2.4)

## 2021-10-24 MED ORDER — AMIODARONE HCL 400 MG PO TABS: 400.0000 mg | ORAL_TABLET | Freq: Two times a day (BID) | ORAL | 0 refills | Status: DC

## 2021-10-24 MED ORDER — AMIODARONE HCL 200 MG PO TABS: 200.0000 mg | ORAL_TABLET | Freq: Two times a day (BID) | ORAL | 0 refills | Status: DC

## 2021-10-24 MED ORDER — FLUCONAZOLE 200 MG PO TABS: 400.0000 mg | ORAL_TABLET | Freq: Every day | ORAL | 0 refills | Status: AC

## 2021-10-24 MED ORDER — APIXABAN 5 MG PO TABS: 5.0000 mg | ORAL_TABLET | Freq: Two times a day (BID) | ORAL | 0 refills | Status: DC

## 2021-10-24 MED ORDER — CIPROFLOXACIN HCL 500 MG PO TABS: 500.0000 mg | ORAL_TABLET | Freq: Two times a day (BID) | ORAL | 0 refills | Status: AC

## 2021-10-24 MED ORDER — METRONIDAZOLE 500 MG PO TABS: 500.0000 mg | ORAL_TABLET | Freq: Three times a day (TID) | ORAL | 0 refills | Status: AC

## 2021-10-24 NOTE — Discharge Summary (Addendum)
?Patient ID: ?Julie Williams [id number] ?56 y.o. ?[birth year] ? ?10/16/2021 ? ?Discharge date and time: 10/24/2021 ? ?Admitting Physician: Nickola Major Malasha Kleppe ? ?Discharge Physician: Nickola Major Marvel Sapp ? ?Admission Diagnoses: Intra-abdominal abscess ([place]) [K65.1] ?Anastomotic leak of intestine [K91.89] ?Patient Active Problem List  ? Diagnosis Date Noted  ? Anastomotic leak of intestine 10/16/2021  ? Small bowel mass 09/18/2021  ? Ganglion cyst of volar aspect of right wrist 03/23/2021  ? Mass of right wrist 03/06/2021  ? Pain of left heel 03/06/2021  ? Mass of right ear 03/06/2021  ? Trigger finger, left middle finger 10/24/2020  ? Muscle cramps 10/24/2020  ? Back pain 06/20/2020  ? Need for shingles vaccine 08/12/2019  ? Hypothyroidism 06/03/2019  ? Chronic pain syndrome 06/03/2019  ? Incisional hernia s/p open component separation & repair w absorbable Phasix mesh 06/02/2019 06/03/2019  ? Diverticulosis 03/2019  ? Hair loss 01/06/2019  ? Anemia due to chronic illness 10/01/2018  ? Rectal perforation s/p partial colectomy/colostomy Jan 2020 08/27/2018  ? Hx SBO 07/2018  ? Impingement syndrome of right shoulder region 11/10/2017  ? Vaginal atrophy 07/05/2016  ? Acromioclavicular joint arthritis 12/25/2015  ? Cervical spine degeneration 06/19/2015  ? Full thickness rotator cuff tear 01/06/2015  ? GERD (gastroesophageal reflux disease) 11/07/2014  ? Benign essential HTN 09/13/2011  ? History of colonic polyps 09/13/2011  ? Tobacco use disorder 07/10/2011  ? Long term (current) use of opiate analgesic 06/04/2011  ? Chronic neck pain 06/04/2011  ? Depression 07/17/2009  ? Rotator cuff syndrome of right shoulder 06/29/2009  ? ? ? ?Discharge Diagnoses: Enterocutaneous fistula ?Patient Active Problem List  ? Diagnosis Date Noted  ? Anastomotic leak of intestine 10/16/2021  ? Small bowel mass 09/18/2021  ? Ganglion cyst of volar aspect of right wrist 03/23/2021  ? Mass of right wrist 03/06/2021  ? Pain of left heel 03/06/2021   ? Mass of right ear 03/06/2021  ? Trigger finger, left middle finger 10/24/2020  ? Muscle cramps 10/24/2020  ? Back pain 06/20/2020  ? Need for shingles vaccine 08/12/2019  ? Hypothyroidism 06/03/2019  ? Chronic pain syndrome 06/03/2019  ? Incisional hernia s/p open component separation & repair w absorbable Phasix mesh 06/02/2019 06/03/2019  ? Diverticulosis 03/2019  ? Hair loss 01/06/2019  ? Anemia due to chronic illness 10/01/2018  ? Rectal perforation s/p partial colectomy/colostomy Jan 2020 08/27/2018  ? Hx SBO 07/2018  ? Impingement syndrome of right shoulder region 11/10/2017  ? Vaginal atrophy 07/05/2016  ? Acromioclavicular joint arthritis 12/25/2015  ? Cervical spine degeneration 06/19/2015  ? Full thickness rotator cuff tear 01/06/2015  ? GERD (gastroesophageal reflux disease) 11/07/2014  ? Benign essential HTN 09/13/2011  ? History of colonic polyps 09/13/2011  ? Tobacco use disorder 07/10/2011  ? Long term (current) use of opiate analgesic 06/04/2011  ? Chronic neck pain 06/04/2011  ? Depression 07/17/2009  ? Rotator cuff syndrome of right shoulder 06/29/2009  ? ? ?Operations:  ? ?Admission Condition: poor ? ?Discharged Condition: fair ? ?Indication for Admission: Intra-abdominal abscesses after small bowel resection ? ?Hospital Course: Presented with intra-abdominal infection related to leak from recent small bowel anastomosis.  Leak appeared to be controlled and found its way out the midline incision.  IR drains were placed in nearby fluid collections in attempt to promote a more controlled method of drainage, however drainage continued out the midline.  TPN was attempted with NPO, however she developed fluid overload, so decision was made to proceed with liquid diet and  nutritional supplementation.  She developed a. Fib with RVR during the stay and was started on amiodarone and eliquis by the cardiology team.  She will follow up with cardiology for a. Fib management.  She will follow up with IR for  drain removal.  She will follow up in my office for ongoing care for this fistula. ? ? ?Consults: cardiology and interventional radiology ? ?Significant Diagnostic Studies: CT ? ?Treatments: IR drainage ? ?Disposition: Home ? ?Patient Instructions:  ?Allergies as of 10/24/2021   ? ?   Reactions  ? Bee Venom Swelling  ? ?  ? ?  ?Medication List  ?  ? ?TAKE these medications   ? ?amiodarone 200 MG tablet ?Commonly known as: Pacerone ?Take 1 tablet (200 mg total) by mouth 2 (two) times daily. ?  ?amLODipine 10 MG tablet ?Commonly known as: NORVASC ?Take 10 mg by mouth daily. ?  ?apixaban 5 MG Tabs tablet ?Commonly known as: ELIQUIS ?Take 1 tablet (5 mg total) by mouth 2 (two) times daily. ?  ?ciprofloxacin 500 MG tablet ?Commonly known as: CIPRO ?Take 1 tablet (500 mg total) by mouth 2 (two) times daily for 14 days. ?  ?EPINEPHrine 0.3 mg/0.3 mL Soaj injection ?Commonly known as: EPI-PEN ?Inject 0.3 mg into the muscle as needed for anaphylaxis. ?  ?fluconazole 200 MG tablet ?Commonly known as: DIFLUCAN ?Take 2 tablets (400 mg total) by mouth daily for 9 days. ?  ?HAIR/SKIN/NAILS PO ?Take 1 tablet by mouth daily. ?  ?levothyroxine 25 MCG tablet ?Commonly known as: SYNTHROID ?Take 1 tablet (25 mcg total) by mouth daily before breakfast. ?  ?metroNIDAZOLE 500 MG tablet ?Commonly known as: FLAGYL ?Take 1 tablet (500 mg total) by mouth every 8 (eight) hours for 14 days. ?  ?morphine 30 MG tablet ?Commonly known as: MSIR ?Take 1 tablet (30 mg total) by mouth every 6 (six) hours as needed for severe pain. ?What changed: when to take this ?  ?naloxone 4 MG/0.1ML Liqd nasal spray kit ?Commonly known as: NARCAN ?1 spray as needed (overdose). ?  ?omeprazole 40 MG capsule ?Commonly known as: PRILOSEC ?TAKE 1 CAPSULE BY MOUTH EVERY DAY ?  ?polyethylene glycol 17 g packet ?Commonly known as: MIRALAX / GLYCOLAX ?Take 17 g by mouth daily. ?What changed:  ?when to take this ?reasons to take this ?  ?Vitamin D3 250 MCG (10000 UT)  Tabs ?Take 10,000 mcg by mouth daily. ?  ?vortioxetine HBr 10 MG Tabs tablet ?Commonly known as: Trintellix ?Take 1 tablet (10 mg total) by mouth daily. ?  ? ?  ? ? ?Activity: activity as tolerated - no heavy lifting or strenuous activities. ?Diet:  pureed diet ?Wound Care:  ostomy appliance to lower aspect of midline incision where drainage collects.  Measure drainage output. ? ?Follow-up:  With Dr. Thermon Leyland in 7 days. ? ?Signed: ?Nickola Major Taje Tondreau ?General, Bariatric, & Minimally Invasive Surgery ?[facility], [place] ? ? ?10/24/2021, 8:28 AM ? ?

## 2021-10-25 ENCOUNTER — Other Ambulatory Visit: Payer: Self-pay | Admitting: Surgery

## 2021-10-25 DIAGNOSIS — T8143XA Infection following a procedure, organ and space surgical site, initial encounter: Secondary | ICD-10-CM

## 2021-10-25 DIAGNOSIS — K9189 Other postprocedural complications and disorders of digestive system: Secondary | ICD-10-CM

## 2021-10-25 NOTE — Progress Notes (Signed)
Patient presented with sepsis secondary to intra-abdominal infection. ?

## 2021-11-01 ENCOUNTER — Ambulatory Visit
Admission: RE | Admit: 2021-11-01 | Discharge: 2021-11-01 | Disposition: A | Discharge status: Home / Self Care | Payer: Medicare Other | Source: Ambulatory Visit | Attending: Surgery | Admitting: Surgery

## 2021-11-01 ENCOUNTER — Other Ambulatory Visit: Payer: Self-pay | Admitting: Surgery

## 2021-11-01 ENCOUNTER — Other Ambulatory Visit (HOSPITAL_COMMUNITY): Payer: Self-pay

## 2021-11-01 ENCOUNTER — Ambulatory Visit
Admission: RE | Admit: 2021-11-01 | Discharge: 2021-11-01 | Disposition: A | Discharge status: Home / Self Care | Payer: Medicare Other | Source: Ambulatory Visit | Attending: Radiology | Admitting: Radiology

## 2021-11-01 DIAGNOSIS — K9189 Other postprocedural complications and disorders of digestive system: Secondary | ICD-10-CM

## 2021-11-01 DIAGNOSIS — T8143XA Infection following a procedure, organ and space surgical site, initial encounter: Secondary | ICD-10-CM

## 2021-11-01 HISTORY — PX: IR RADIOLOGIST EVAL & MGMT: IMG5224

## 2021-11-01 MED ORDER — NORMAL SALINE FLUSH 0.9 % IV SOLN
Freq: Two times a day (BID) | INTRAVENOUS | 0 refills | Status: DC
Start: 2021-11-01 — End: 2021-11-15
  Filled 2021-11-01: qty 300, 15d supply, fill #0

## 2021-11-01 MED ORDER — IOPAMIDOL (ISOVUE-300) INJECTION 61%
100.0000 mL | Freq: Once | INTRAVENOUS | Status: AC | PRN
  Administered 2021-11-01: 100 mL via INTRAVENOUS
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
## 2021-11-13 ENCOUNTER — Ambulatory Visit (HOSPITAL_COMMUNITY)
Admission: RE | Admit: 2021-11-13 | Discharge: 2021-11-13 | Disposition: A | Discharge status: Home / Self Care | Payer: Medicare Other | Source: Ambulatory Visit | Attending: Interventional Radiology | Admitting: Interventional Radiology

## 2021-11-13 ENCOUNTER — Ambulatory Visit
Admission: RE | Admit: 2021-11-13 | Discharge: 2021-11-13 | Disposition: A | Discharge status: Home / Self Care | Payer: Medicare Other | Source: Ambulatory Visit | Attending: Surgery | Admitting: Surgery

## 2021-11-13 ENCOUNTER — Other Ambulatory Visit: Payer: Self-pay

## 2021-11-13 ENCOUNTER — Other Ambulatory Visit (HOSPITAL_COMMUNITY): Payer: Self-pay | Admitting: Interventional Radiology

## 2021-11-13 DIAGNOSIS — T8143XA Infection following a procedure, organ and space surgical site, initial encounter: Secondary | ICD-10-CM

## 2021-11-13 DIAGNOSIS — K9189 Other postprocedural complications and disorders of digestive system: Secondary | ICD-10-CM

## 2021-11-13 DIAGNOSIS — Z4803 Encounter for change or removal of drains: Secondary | ICD-10-CM | POA: Insufficient documentation

## 2021-11-13 DIAGNOSIS — Y839 Surgical procedure, unspecified as the cause of abnormal reaction of the patient, or of later complication, without mention of misadventure at the time of the procedure: Secondary | ICD-10-CM | POA: Diagnosis not present

## 2021-11-13 HISTORY — PX: IR CATHETER TUBE CHANGE: IMG717

## 2021-11-13 HISTORY — PX: IR RADIOLOGIST EVAL & MGMT: IMG5224

## 2021-11-13 MED ORDER — IOPAMIDOL (ISOVUE-300) INJECTION 61%
100.0000 mL | Freq: Once | INTRAVENOUS | Status: AC | PRN
  Administered 2021-11-13: 100 mL via INTRAVENOUS

## 2021-11-13 MED ORDER — LIDOCAINE HCL 1 % IJ SOLN
INTRAMUSCULAR | Status: AC
  Filled 2021-11-13: qty 20

## 2021-11-13 MED ORDER — IOHEXOL 300 MG/ML  SOLN
50.0000 mL | Freq: Once | INTRAMUSCULAR | Status: AC | PRN
  Administered 2021-11-13: 5 mL
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
## 2021-11-13 NOTE — Procedures (Signed)
Interventional Radiology Procedure Note ? ?Procedure: Exchange of abdominal drain, for new 7F drain. Left abdomen. ?To gravity.   ? ?Complications: None ? ?Recommendations:  ?- Routine drain care ?- Do not submerge  ?- Dc now. ? ?Signed, ? ?Dulcy Fanny. Earleen Newport, DO ? ? ?

## 2021-11-14 ENCOUNTER — Ambulatory Visit (HOSPITAL_COMMUNITY)
Admission: RE | Admit: 2021-11-14 | Discharge: 2021-11-14 | Disposition: A | Discharge status: Home / Self Care | Payer: Medicare Other | Source: Ambulatory Visit | Attending: Student | Admitting: Student

## 2021-11-14 ENCOUNTER — Encounter (HOSPITAL_COMMUNITY): Payer: Self-pay | Admitting: Radiology

## 2021-11-14 ENCOUNTER — Other Ambulatory Visit (HOSPITAL_COMMUNITY): Payer: Self-pay | Admitting: Student

## 2021-11-14 DIAGNOSIS — K651 Peritoneal abscess: Secondary | ICD-10-CM | POA: Diagnosis present

## 2021-11-14 HISTORY — PX: IR PATIENT EVAL TECH 0-60 MINS: IMG5564

## 2021-11-14 NOTE — Procedures (Signed)
Patient came in today to have drain flushed and loosen the drain bag from the drain tube. The tubing was too tight for her to flush the tube at home. She left with instructions to call if she needed further assistant before her next scheduled appointment. ?

## 2021-11-15 ENCOUNTER — Other Ambulatory Visit (HOSPITAL_COMMUNITY): Payer: Self-pay

## 2021-11-15 ENCOUNTER — Other Ambulatory Visit: Payer: Self-pay | Admitting: Surgery

## 2021-11-15 DIAGNOSIS — K651 Peritoneal abscess: Secondary | ICD-10-CM

## 2021-11-15 MED ORDER — NORMAL SALINE FLUSH 0.9 % IV SOLN
10.0000 mL | Freq: Two times a day (BID) | INTRAVENOUS | 1 refills | Status: DC
  Filled 2021-11-15: qty 600, 30d supply, fill #0

## 2021-11-22 ENCOUNTER — Ambulatory Visit
Admission: RE | Admit: 2021-11-22 | Discharge: 2021-11-22 | Disposition: A | Discharge status: Home / Self Care | Payer: Medicare Other | Source: Ambulatory Visit | Attending: Surgery | Admitting: Surgery

## 2021-11-22 ENCOUNTER — Other Ambulatory Visit: Payer: Self-pay | Admitting: Surgery

## 2021-11-22 DIAGNOSIS — K651 Peritoneal abscess: Secondary | ICD-10-CM

## 2021-11-22 HISTORY — PX: IR RADIOLOGIST EVAL & MGMT: IMG5224

## 2021-11-22 MED ORDER — IOPAMIDOL (ISOVUE-300) INJECTION 61%
100.0000 mL | Freq: Once | INTRAVENOUS | Status: AC | PRN
  Administered 2021-11-22: 100 mL via INTRAVENOUS

## 2021-11-22 NOTE — Progress Notes (Signed)
? ?Referring Physician(s): ?Stechschulte,Paul J ? ?Reason for follow up:  [facility]  ? ?History of present illness: ?  ?Presented with intra-abdominal infection related to leak from recent small bowel anastomosis.  IR drains were placed in fluid collections.  2 of these drains have been removed.  A single left-sided drain remains. ?  ?Since discharge, Julie Williams has remained on antibiotics and is flushing her drain twice daily.  She reports less than 10cc daily OP at this time, which remains purulent and opaque.   ? ?Vital Signs: ?There were no vitals taken for this visit. ? ?Physical Exam ?Constitutional:   ?   General: She is not in acute distress. ?HENT:  ?   Head: Normocephalic.  ?Eyes:  ?   General: No scleral icterus. ?Cardiovascular:  ?   Rate and Rhythm: Normal rate and regular rhythm.  ?Pulmonary:  ?   Effort: Pulmonary effort is normal.  ?Abdominal:  ?   General: There is no distension.  ?   Comments: LUQ drain in place with clean, dry, and intact dressing.  Light tan, opaque fluid in drainage bag.  ?Musculoskeletal:  ?   Right lower leg: No edema.  ?   Left lower leg: No edema.  ?Neurological:  ?   Mental Status: She is oriented to person, place, and time.  ? ? ?Imaging: ?CT ABDOMEN PELVIS W CONTRAST ? ?Result Date: 11/22/2021 ?CLINICAL DATA:  56 year old female with history of postoperative intra-abdominal abscess requiring multiple prior percutaneous drains, now with single remaining drain left upper quadrant. EXAM: CT ABDOMEN AND PELVIS WITH CONTRAST TECHNIQUE: Multidetector CT imaging of the abdomen and pelvis was performed using the standard protocol following bolus administration of intravenous contrast. RADIATION DOSE REDUCTION: This exam was performed according to the departmental dose-optimization program which includes automated exposure control, adjustment of the mA and/or kV according to patient size and/or use of iterative reconstruction technique. CONTRAST:  100 mL Isovue 300, intravenous  COMPARISON:  11/13/2021 FINDINGS: Lower chest: Mild bibasilar subsegmental atelectasis, improved from comparison. Hepatobiliary: The liver is normal in size, contour, and attenuation. The gallbladder is present unremarkable. There is persistent, slightly decreased mild intra and extrahepatic biliary ductal dilation with the common bile duct measuring up to 1.1 cm in maximum short axis diameter. No filling defects are visualized. Pancreas: Unremarkable. No pancreatic ductal dilatation or surrounding inflammatory changes. Spleen: Normal in size without focal abnormality. Adrenals/Urinary Tract: Adrenal glands are unremarkable. Kidneys are normal, without renal calculi, focal lesion, or hydronephrosis. Bladder is unremarkable. Stomach/Bowel: The stomach is within normal limits. Postsurgical changes of the proximal small bowel without complicating features. Postsurgical change about the cecum without complicating features. No evidence of bowel wall thickening or inflammation. Vascular/Lymphatic: Aortic atherosclerosis. No enlarged abdominal or pelvic lymph nodes. Prominent bilateral ovarian veins and periuterine varicosities. Reproductive: Uterus and bilateral adnexa are unremarkable. Other: Left upper quadrant percutaneous pigtail drain in place with near complete resolution of previously visualized fluid collection. There are few trace fluid and gas collections in the left upper quadrant, the largest just anterior and superior to the left upper quadrant postsurgical staple line which measures up to approximately 3.6 x 1.1 cm in greatest axial dimensions and in the left retroperitoneum measuring up to approximately 4.3 x 1.6 cm. Musculoskeletal: No acute or significant osseous findings. IMPRESSION: 1. Overall decreased size of previously visualized left upper quadrant intra-abdominal abscesses with unchanged position of indwelling drain. 2. Prominent bilateral gonadal and periuterine veins as could be seen with pelvic  venous  disorder. Ruthann Cancer, MD [facility] Radiology Electronically Signed   By: Ruthann Cancer M.D.   On: 11/22/2021 13:25  ? ?DG Sinus/Fist Tube Chk-Non GI ? ?Result Date: 11/22/2021 ?CLINICAL DATA:  56 year old female with history of multiple postoperative abdominal fluid collections with indwelling left upper quadrant drain. Prior bowel fistula observed. CT abdomen pelvis today demonstrates decreased volume of intra-abdominal fluid collections. EXAM: ABSCESS INJECTION COMPARISON:  11/13/2021 CONTRAST:  20 mL Omnipaque 300.-administered via the existing percutaneous drain. FLUOROSCOPY TIME:  2 minutes, 18 seconds, 0.5 mGy TECHNIQUE: The patient was positioned supine on the fluoroscopy table. A preprocedural spot fluoroscopic image was obtained of the left upper quadrant and the existing percutaneous drainage catheter. Multiple spot fluoroscopic and radiographic images were obtained following the injection of a small amount of contrast via the existing percutaneous drainage catheter. FINDINGS: Persistent small bowel enteric fistula with small residual left upper quadrant fluid collection. IMPRESSION: Persistent small bowel enteric fistula. PLAN: Keep to bag drainage. Discontinue irregular flushing. Return to IR drain clinic in 3-4 weeks for repeat evaluation. Ruthann Cancer, MD [facility] Radiology Electronically Signed   By: Ruthann Cancer M.D.   On: 11/22/2021 13:29  ? ?IR Radiologist Eval & Mgmt ? ?Result Date: 11/22/2021 ?Please refer to notes tab for details about interventional procedure. (Op Note)  ? ?Labs: ? ?CBC: ?Recent Labs  ?  10/20/21 ?0745 10/21/21 ?0500 10/22/21 ?1801 10/24/21 ?0325  ?WBC 9.6 9.9 10.8* 11.1*  ?HGB 9.4* 8.6* 8.7* 8.5*  ?HCT 28.4* 27.0* 27.3* 26.5*  ?PLT 467* 485* 495* 514*  ? ? ?COAGS: ?Recent Labs  ?  10/17/21 ?0007  ?INR 1.3*  ? ? ?BMP: ?Recent Labs  ?  10/22/21 ?1594 10/22/21 ?1801  10/23/21 ?0500 10/24/21 ?0325  ?NA 135 134* 136 135  ?K 3.6 4.1 3.9 4.5  ?CL 107 107 107 104  ?CO2 '22 22 25 25  '$ ?GLUCOSE 120* 103* 137* 80  ?BUN '7 8 9 9  '$ ?CALCIUM 7.9* 7.9* 8.0* 7.9*  ?CREATININE 0.93 0.93 0.80 0.75  ?GFRNONAA >60 >60 >60 >60  ? ? ?LIVER FUNCTION TESTS: ?Recent Labs  ?  10/22/21 ?5859 10/22/21 ?1801 10/23/21 ?0500 10/24/21 ?0325  ?BILITOT 0.3 0.5 0.2* 0.4  ?AST 13* 14* 13* 11*  ?ALT '10 9 8 8  '$ ?ALKPHOS 53 56 52 50  ?PROT 6.4* 6.8 6.5 6.8  ?ALBUMIN 1.8* 1.9* 1.7* 1.7*  ? ? ?Assessment and Plan: ?56 year old female with history of post-operative intraabdominal abscess status post multiple percutaneous drain placements, now with sole LUQ drain in place.  The drain is functioning well with overall decreased volume of complex fluid.  Drain injection today shows persistent fistulous connection with the proximal small bowel. ? ?-keep drain in place ?-keep to bag drainage ?-discontinue BID saline flushes ?-follow up in [facility] in 3-4 weeks ? ?Electronically Signed: ?Melvyn Hommes J Emmajo Bennette ?11/22/2021, 2:00 PM ? ? ?I spent a total of 40 Minutes in face to face in clinical consultation, greater than 50% of which was counseling/coordinating care for drain care. ? ? ? ? ? ?

## 2021-12-11 ENCOUNTER — Ambulatory Visit
Admission: RE | Admit: 2021-12-11 | Discharge: 2021-12-11 | Disposition: A | Discharge status: Home / Self Care | Payer: Medicare Other | Source: Ambulatory Visit | Attending: Surgery | Admitting: Surgery

## 2021-12-11 DIAGNOSIS — K651 Peritoneal abscess: Secondary | ICD-10-CM

## 2021-12-11 HISTORY — PX: IR RADIOLOGIST EVAL & MGMT: IMG5224

## 2021-12-11 MED ORDER — IOPAMIDOL (ISOVUE-300) INJECTION 61%
100.0000 mL | Freq: Once | INTRAVENOUS | Status: AC | PRN
  Administered 2021-12-11: 100 mL via INTRAVENOUS
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
## 2021-12-13 ENCOUNTER — Other Ambulatory Visit: Payer: Medicare Other

## 2022-02-15 ENCOUNTER — Telehealth: Payer: Self-pay | Admitting: Nurse Practitioner

## 2022-02-28 ENCOUNTER — Other Ambulatory Visit: Payer: Self-pay

## 2022-02-28 ENCOUNTER — Inpatient Hospital Stay: Payer: Medicare Other

## 2022-02-28 ENCOUNTER — Inpatient Hospital Stay: Payer: Medicare Other | Attending: Nurse Practitioner | Admitting: Nurse Practitioner

## 2022-02-28 VITALS — BP 99/93 | HR 98 | Temp 98.5°F | Wt 107.7 lb

## 2022-02-28 DIAGNOSIS — R634 Abnormal weight loss: Secondary | ICD-10-CM | POA: Insufficient documentation

## 2022-02-28 DIAGNOSIS — L659 Nonscarring hair loss, unspecified: Secondary | ICD-10-CM | POA: Diagnosis not present

## 2022-02-28 DIAGNOSIS — E538 Deficiency of other specified B group vitamins: Secondary | ICD-10-CM | POA: Diagnosis not present

## 2022-02-28 DIAGNOSIS — L603 Nail dystrophy: Secondary | ICD-10-CM | POA: Insufficient documentation

## 2022-02-28 DIAGNOSIS — Z7901 Long term (current) use of anticoagulants: Secondary | ICD-10-CM | POA: Diagnosis not present

## 2022-02-28 DIAGNOSIS — K632 Fistula of intestine: Secondary | ICD-10-CM | POA: Insufficient documentation

## 2022-02-28 DIAGNOSIS — D649 Anemia, unspecified: Secondary | ICD-10-CM | POA: Diagnosis present

## 2022-02-28 DIAGNOSIS — I482 Chronic atrial fibrillation, unspecified: Secondary | ICD-10-CM | POA: Insufficient documentation

## 2022-02-28 LAB — CBC WITH DIFFERENTIAL (CANCER CENTER ONLY)
Abs Immature Granulocytes: 0.02 10*3/uL (ref 0.00–0.07)
Basophils Absolute: 0 10*3/uL (ref 0.0–0.1)
Basophils Relative: 0 %
Eosinophils Absolute: 0 10*3/uL (ref 0.0–0.5)
Eosinophils Relative: 0 %
HCT: 45.4 % (ref 36.0–46.0)
Hemoglobin: 14.9 g/dL (ref 12.0–15.0)
Immature Granulocytes: 0 %
Lymphocytes Relative: 31 %
Lymphs Abs: 2.9 10*3/uL (ref 0.7–4.0)
MCH: 31.4 pg (ref 26.0–34.0)
MCHC: 32.8 g/dL (ref 30.0–36.0)
MCV: 95.6 fL (ref 80.0–100.0)
Monocytes Absolute: 0.7 10*3/uL (ref 0.1–1.0)
Monocytes Relative: 8 %
Neutro Abs: 5.6 10*3/uL (ref 1.7–7.7)
Neutrophils Relative %: 61 %
Platelet Count: 252 10*3/uL (ref 150–400)
RBC: 4.75 MIL/uL (ref 3.87–5.11)
RDW: 13.5 % (ref 11.5–15.5)
WBC Count: 9.2 10*3/uL (ref 4.0–10.5)
nRBC: 0 % (ref 0.0–0.2)

## 2022-02-28 LAB — IRON AND IRON BINDING CAPACITY (CC-WL,HP ONLY)
Iron: 33 ug/dL (ref 28–170)
Saturation Ratios: 11 % (ref 10.4–31.8)
TIBC: 295 ug/dL (ref 250–450)
UIBC: 262 ug/dL (ref 148–442)

## 2022-02-28 LAB — RETIC PANEL
Immature Retic Fract: 9 % (ref 2.3–15.9)
RBC.: 4.68 MIL/uL (ref 3.87–5.11)
Retic Count, Absolute: 47.3 10*3/uL (ref 19.0–186.0)
Retic Ct Pct: 1 % (ref 0.4–3.1)
Reticulocyte Hemoglobin: 34 pg (ref >27.9)

## 2022-02-28 LAB — VITAMIN B12: Vitamin B-12: 82 pg/mL — ABNORMAL LOW (ref 180–914)

## 2022-02-28 LAB — FERRITIN: Ferritin: 208 ng/mL (ref 11–307)
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
## 2022-03-01 ENCOUNTER — Other Ambulatory Visit: Payer: Self-pay

## 2022-03-01 ENCOUNTER — Telehealth: Payer: Self-pay

## 2022-03-01 ENCOUNTER — Encounter: Payer: Self-pay | Admitting: Nurse Practitioner

## 2022-03-01 ENCOUNTER — Telehealth: Payer: Self-pay | Admitting: Hematology

## 2022-03-01 DIAGNOSIS — E538 Deficiency of other specified B group vitamins: Secondary | ICD-10-CM | POA: Insufficient documentation

## 2022-03-01 LAB — FOLATE RBC
Folate, Hemolysate: 567 ng/mL
Folate, RBC: 1292 ng/mL (ref >498)
Hematocrit: 43.9 % (ref 34.0–46.6)

## 2022-03-01 NOTE — Telephone Encounter (Signed)
Scheduled per 7/7, message has been left with pt

## 2022-03-01 NOTE — Telephone Encounter (Signed)
Scheduled follow-up appointment per 7/6 los. Patient is aware. Patient declined scheduling injection appointments and requests to take B-12 pills instead. Sent message to provider.

## 2022-03-01 NOTE — Telephone Encounter (Signed)
LVM stating the the pt's recent labs per Cira Rue, NP shows her anemia has resolved and her Hbg is now WNL.  Informed pt that her B12 remains very low and Lacie recommends that the pt receives wkly B12 injections for 2 months and thereafter every 2 months.  Instructed pt to please contact Natasha Bence, NP office should she have additional questions or concerns.

## 2022-03-02 ENCOUNTER — Encounter: Payer: Self-pay | Admitting: Nurse Practitioner

## 2022-03-02 LAB — COPPER, SERUM: Copper: 157 ug/dL (ref 80–158)

## 2022-03-02 LAB — METHYLMALONIC ACID, SERUM: Methylmalonic Acid, Quantitative: 1282 nmol/L — ABNORMAL HIGH (ref 0–378)

## 2022-03-04 ENCOUNTER — Telehealth: Payer: Self-pay

## 2022-03-04 NOTE — Telephone Encounter (Signed)
This nurse received a message from this patient stating that she does not want to do B-12 injections.  She would prefer to take the oral tablets.  Patient states that it is also to far for her to travel weekly to get the injections.  Patient is requesting to know what is the oral dose that she can take instead of the injections.  This information has been forwarded to the provider for advisement.

## 2022-03-05 ENCOUNTER — Telehealth: Payer: Self-pay

## 2022-03-05 ENCOUNTER — Encounter: Payer: Self-pay | Admitting: Nurse Practitioner

## 2022-03-05 NOTE — Telephone Encounter (Signed)
Spoke with pt via telephone regarding Vitamin B-12 injections that was recommended by Cira Rue, NP and Dr. Burr Medico d/t pt's malabsorption from abdominal surgery (part of colon removed).  Informed pt that Lacie and Dr. Burr Medico recommended B12 injections due to oral supplement of B12 will not be absorbed.  Pt was adamant that she would absorb the oral supplement of B12 and asked how many milligrams of B12 should she take.  Informed pt that Lacie recommends that she take 1,000 mcg of Sublingual B12 daily.  Pt stated she will take it daily for 8 wks at which time she will f/u with Lacie in clinic.  Pt states she's certain that her R84 will improve by the time she sees Lacie in September.  Pt stated that if her B12 does not improve, she will then consider doing the B12 injections.  Pt had no further questions or concerns at this time.

## 2022-03-06 ENCOUNTER — Telehealth: Payer: Self-pay | Admitting: Hematology

## 2022-03-06 NOTE — Telephone Encounter (Signed)
Rescheduled upcoming appointment per 7/11 patient call message. Patient is aware of changes.

## 2022-03-11 ENCOUNTER — Inpatient Hospital Stay: Payer: Medicare Other

## 2022-03-11 ENCOUNTER — Other Ambulatory Visit: Payer: Self-pay

## 2022-03-11 NOTE — Progress Notes (Signed)
Nutrition Assessment   Reason for Assessment:  Weight loss   ASSESSMENT: 56 year old female seeing Dr Burr Medico for anemia.  Initially found to have jejunal mass and abdominal adenopathy concerning for malignancy.  Patient underwent surgical resection but path was negative but showed perforation of small intestine (09/2021).  Complications of anastomotic leak occurred with infection occurred.  Noted TPN attempted but developed fluid overload, afib.  Drain removed in 12/11/21.    Met with patient in RD's office.  Patient says that she is eating well. Says that she lost weight, her hair starting coming out, lost her boobs and butt after 09/2021 surgery. She says that she eats healthy.  She is argumentative, frustrated at the medical community.  Patient complained about medical issues not being addressed and resolved by doctors and her having to continue follow-up and pay for visits.   Drinks premier protein shakes.  Says that she eats oatmeal for breakfast or cereal and fruit or bacon and eggs.  Lunch is leftover from supper.  Supper is stuffed bell peppers or meat baked or broiled.  Was having issues with nausea but improved.  At one point during conversation RD asked patient if she wanted any nutrition recommendations as she was increasingly frustrated and disputing RD's recommendations  Medications: Vit B 12 sublingual, Vit D, biotin with Vit C and E   Labs: 7/6 iron 33, folate 1292, copper 157, vit B 12 82 (low)   Anthropometrics:   Height: 65 inches Weight: 110 lb 2 oz today in RD's office UBW: 134 lb in Feb 2023 per patient BMI: 17  18% weight loss in the last 5 months, significant   Estimated Energy Needs  Kcals: 1500-1750 Protein: 75-88 g Fluid: > 1500   NUTRITION DIAGNOSIS: Unintentional weight loss related to multiple abdominal surgeries as evidenced by 18% weight loss in the last 5 months and likely poor po intake during admission now improving   INTERVENTION:  Tried to  explain that likely problems are coming from malnutrition from side effects of surgery.   Recommend switching to higher calorie shake 350 or higher. (Examples ensure complete, ensure plus, equate plus, boost plus).  Patient says she can't find these in the store and does not use online ordering.  RD has seen one or more of these products at grocery store, drug store, etc.  Recommend patient look at calories on nutrition facts label of foods she eats and pick the highest calorie product to help her increase weight. Recommend MVI daily.  Wants to know why she has to take all these other vitamins and not just on tablet.  Tried to explain that sometimes additional supplementation is required to bring levels up and MVI may not have enough.   Discussed adding more fat to diet but cautioned patient that it could cause stomach upset.  Says she drinks 2% milk and tolerates it.  Says she does not use any oil to cook foods she bakes and broils.   Handout on anemia given to patient. Includes foods to eat with Fe, folate, Vit B 12 deficencies to include.   Discussed with patient that if she desires further follow-up to contact nutrition team.    Next Visit: no follow-up Patient to notify nutrition team at the cancer center if needed  [place]. Zenia Resides, [place], [place] Registered Dietitian [phone]

## 2022-03-22 ENCOUNTER — Encounter: Payer: Self-pay | Admitting: Nurse Practitioner
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
## 2022-04-04 ENCOUNTER — Encounter: Payer: Self-pay | Admitting: Internal Medicine

## 2022-04-04 ENCOUNTER — Ambulatory Visit (INDEPENDENT_AMBULATORY_CARE_PROVIDER_SITE_OTHER): Payer: Medicare Other | Admitting: Internal Medicine

## 2022-04-04 VITALS — BP 122/68 | HR 67 | Ht 65.0 in | Wt 113.4 lb

## 2022-04-04 DIAGNOSIS — I480 Paroxysmal atrial fibrillation: Secondary | ICD-10-CM | POA: Diagnosis not present

## 2022-04-04 NOTE — Patient Instructions (Addendum)
Medication Instructions:   -Stop amiodarone (pacerone).  -Stop apixaban (eliquis).   *If you need a refill on your cardiac medications before your next appointment, please call your pharmacy*   Follow-Up: At [facility], you and your health needs are our priority.  As part of our continuing mission to provide you with exceptional heart care, we have created designated Provider Care Teams.  These Care Teams include your primary Cardiologist (physician) and Advanced Practice Providers (APPs -  Physician Assistants and Nurse Practitioners) who all work together to provide you with the care you need, when you need it.  We recommend signing up for the patient portal called "MyChart".  Sign up information is provided on this After Visit Summary.  MyChart is used to connect with patients for Virtual Visits (Telemedicine).  Patients are able to view lab/test results, encounter notes, upcoming appointments, etc.  Non-urgent messages can be sent to your provider as well.   To learn more about what you can do with MyChart, go to [email or web address].    Your next appointment:   12 month(s)  The format for your next appointment:   In Person  Provider:   Phineas Inches, MD

## 2022-04-10 ENCOUNTER — Telehealth: Payer: Self-pay | Admitting: *Deleted

## 2022-04-10 NOTE — Telephone Encounter (Signed)
   Pre-operative Risk Assessment    Patient Name: Julie Williams  DOB: [birth year] MRN: [id number]      Request for Surgical Clearance    Procedure:   ORIF LEFT WRIST  Date of Surgery:  Clearance 04/18/22                                 Surgeon:  Edmonia Lynch, MD Surgeon's Group or Practice Name:  [facility] Phone number:  [phone] Fax number:  [phone]   Type of Clearance Requested:   - Medical    Type of Anesthesia:   CHOICE   Additional requests/questions:    Astrid Divine   04/10/2022, 3:51 PM

## 2022-04-11 NOTE — Telephone Encounter (Addendum)
   Patient Name: Julie Williams  DOB: [birth year] MRN: [id number]  Primary Cardiologist: Shirlee More, MD  Chart reviewed as part of pre-operative protocol coverage. Given past medical history and time since last visit, per Dr. Harl Bowie who saw patient on 04/04/2022, and based on ACC/AHA guidelines, Julie Williams would be at acceptable risk for the planned procedure without further cardiovascular testing.   I will route this recommendation to the requesting party via Epic fax function and remove from pre-op pool.  Please call with questions.  Lenna Sciara, NP 04/11/2022, 3:50 PM

## 2022-04-30 ENCOUNTER — Other Ambulatory Visit: Payer: Self-pay

## 2022-04-30 ENCOUNTER — Inpatient Hospital Stay: Payer: Medicare Other | Attending: Nurse Practitioner

## 2022-04-30 DIAGNOSIS — E538 Deficiency of other specified B group vitamins: Secondary | ICD-10-CM | POA: Diagnosis not present

## 2022-04-30 DIAGNOSIS — D649 Anemia, unspecified: Secondary | ICD-10-CM | POA: Diagnosis not present

## 2022-04-30 LAB — CBC WITH DIFFERENTIAL (CANCER CENTER ONLY)
Abs Immature Granulocytes: 0.02 10*3/uL (ref 0.00–0.07)
Basophils Absolute: 0 10*3/uL (ref 0.0–0.1)
Basophils Relative: 0 %
Eosinophils Absolute: 0.1 10*3/uL (ref 0.0–0.5)
Eosinophils Relative: 1 %
HCT: 44.3 % (ref 36.0–46.0)
Hemoglobin: 15 g/dL (ref 12.0–15.0)
Immature Granulocytes: 0 %
Lymphocytes Relative: 53 %
Lymphs Abs: 4 10*3/uL (ref 0.7–4.0)
MCH: 33.2 pg (ref 26.0–34.0)
MCHC: 33.9 g/dL (ref 30.0–36.0)
MCV: 98 fL (ref 80.0–100.0)
Monocytes Absolute: 0.4 10*3/uL (ref 0.1–1.0)
Monocytes Relative: 5 %
Neutro Abs: 3.2 10*3/uL (ref 1.7–7.7)
Neutrophils Relative %: 41 %
Platelet Count: 270 10*3/uL (ref 150–400)
RBC: 4.52 MIL/uL (ref 3.87–5.11)
RDW: 13.8 % (ref 11.5–15.5)
WBC Count: 7.7 10*3/uL (ref 4.0–10.5)
nRBC: 0 % (ref 0.0–0.2)

## 2022-04-30 LAB — VITAMIN B12: Vitamin B-12: 194 pg/mL (ref 180–914)

## 2022-04-30 NOTE — Progress Notes (Signed)
Pt's lab results for today sent to pt's PCP Dr. Nancy Fetter per pt's request.  Epic faxed lab results.  Epic fax confirmation received.

## 2022-05-02 ENCOUNTER — Encounter: Payer: Self-pay | Admitting: Hematology

## 2022-05-02 ENCOUNTER — Inpatient Hospital Stay (HOSPITAL_BASED_OUTPATIENT_CLINIC_OR_DEPARTMENT_OTHER): Payer: Medicare Other | Admitting: Hematology

## 2022-05-02 DIAGNOSIS — E538 Deficiency of other specified B group vitamins: Secondary | ICD-10-CM
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
## 2022-05-23 ENCOUNTER — Inpatient Hospital Stay: Payer: Medicare Other

## 2022-05-23 ENCOUNTER — Inpatient Hospital Stay: Payer: Medicare Other | Admitting: Hematology

## 2022-09-25 ENCOUNTER — Other Ambulatory Visit (HOSPITAL_COMMUNITY): Payer: Self-pay | Admitting: Surgery

## 2022-09-25 ENCOUNTER — Ambulatory Visit (HOSPITAL_BASED_OUTPATIENT_CLINIC_OR_DEPARTMENT_OTHER)
Admission: RE | Admit: 2022-09-25 | Discharge: 2022-09-25 | Disposition: A | Discharge status: Home / Self Care | Payer: 59 | Source: Ambulatory Visit | Attending: Surgery | Admitting: Surgery

## 2022-09-25 ENCOUNTER — Encounter: Payer: Self-pay | Admitting: Nurse Practitioner

## 2022-09-25 DIAGNOSIS — R1012 Left upper quadrant pain: Secondary | ICD-10-CM

## 2022-09-25 MED ORDER — IOHEXOL 300 MG/ML  SOLN
100.0000 mL | Freq: Once | INTRAMUSCULAR | Status: AC | PRN
  Administered 2022-09-25: 100 mL via INTRAVENOUS

## 2022-09-27 ENCOUNTER — Encounter: Payer: Self-pay | Admitting: General Practice

## 2022-09-27 ENCOUNTER — Other Ambulatory Visit (HOSPITAL_COMMUNITY): Payer: Self-pay | Admitting: Surgery

## 2022-09-27 DIAGNOSIS — K651 Peritoneal abscess: Secondary | ICD-10-CM

## 2022-09-27 NOTE — Progress Notes (Signed)
Julie Williams, Julie Ashing, Julie Williams  Allen Kell, NT Approved for CT guided perisplenic fluid collection drain placement.  [place]       Previous Messages    ----- Message ----- From: Allen Kell, NT Sent: 09/27/2022  11:28 AM EST To: Ir Procedure Requests Subject: STAT CT GUIDED PERITONEAL/RETROPERITONEAL FL*  Procedure: STAT CT GUIDED PERITONEAL/RETROPERITONEAL FLUID DRAIN BY PERC CATH  Reason: Intra-abdominal abscess  History: CT abd in chart  Provider: Felicie Morn, Julie Williams  Contact: [phone]

## 2022-10-02 ENCOUNTER — Other Ambulatory Visit: Payer: Self-pay | Admitting: Radiology

## 2022-10-02 ENCOUNTER — Other Ambulatory Visit (HOSPITAL_COMMUNITY): Payer: Self-pay | Admitting: Physician Assistant

## 2022-10-02 DIAGNOSIS — K651 Peritoneal abscess: Secondary | ICD-10-CM
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
## 2022-10-03 ENCOUNTER — Encounter (HOSPITAL_COMMUNITY): Payer: Self-pay

## 2022-10-03 ENCOUNTER — Ambulatory Visit (HOSPITAL_COMMUNITY)
Admission: RE | Admit: 2022-10-03 | Discharge: 2022-10-03 | Disposition: A | Discharge status: Home / Self Care | Payer: 59 | Source: Ambulatory Visit | Attending: Surgery | Admitting: Surgery

## 2022-10-03 DIAGNOSIS — R0602 Shortness of breath: Secondary | ICD-10-CM | POA: Insufficient documentation

## 2022-10-03 DIAGNOSIS — K651 Peritoneal abscess: Secondary | ICD-10-CM | POA: Insufficient documentation

## 2022-10-03 LAB — CBC
HCT: 40.2 % (ref 36.0–46.0)
Hemoglobin: 13.7 g/dL (ref 12.0–15.0)
MCH: 33.7 pg (ref 26.0–34.0)
MCHC: 34.1 g/dL (ref 30.0–36.0)
MCV: 99 fL (ref 80.0–100.0)
Platelets: 594 10*3/uL — ABNORMAL HIGH (ref 150–400)
RBC: 4.06 MIL/uL (ref 3.87–5.11)
RDW: 11.6 % (ref 11.5–15.5)
WBC: 14.6 10*3/uL — ABNORMAL HIGH (ref 4.0–10.5)
nRBC: 0 % (ref 0.0–0.2)

## 2022-10-03 LAB — PROTIME-INR
INR: 1.1 (ref 0.8–1.2)
Prothrombin Time: 14.3 seconds (ref 11.4–15.2)

## 2022-10-03 MED ORDER — SODIUM CHLORIDE 0.9 % IV SOLN: INTRAVENOUS | Status: DC

## 2022-10-03 MED ORDER — LORAZEPAM 2 MG/ML IJ SOLN
INTRAMUSCULAR | Status: AC | PRN
  Administered 2022-10-03: .5 mg via INTRAVENOUS

## 2022-10-03 MED ORDER — MIDAZOLAM HCL 2 MG/2ML IJ SOLN
INTRAMUSCULAR | Status: AC
  Filled 2022-10-03: qty 2

## 2022-10-03 MED ORDER — FENTANYL CITRATE (PF) 100 MCG/2ML IJ SOLN
INTRAMUSCULAR | Status: AC
  Filled 2022-10-03: qty 2

## 2022-10-03 MED ORDER — DIPHENHYDRAMINE HCL 50 MG/ML IJ SOLN
INTRAMUSCULAR | Status: AC
  Filled 2022-10-03: qty 1

## 2022-10-03 MED ORDER — LIDOCAINE HCL 1 % IJ SOLN: 10.0000 mL | Freq: Once | INTRAMUSCULAR | Status: DC

## 2022-10-03 MED ORDER — FENTANYL CITRATE (PF) 100 MCG/2ML IJ SOLN
INTRAMUSCULAR | Status: AC | PRN
  Administered 2022-10-03 (×4): 25 ug via INTRAVENOUS

## 2022-10-03 MED ORDER — DIPHENHYDRAMINE HCL 50 MG/ML IJ SOLN
INTRAMUSCULAR | Status: AC | PRN
  Administered 2022-10-03: 25 mg via INTRAVENOUS

## 2022-10-03 MED ORDER — OXYCODONE-ACETAMINOPHEN 5-325 MG PO TABS: 1.0000 | ORAL_TABLET | ORAL | Status: DC | PRN

## 2022-10-03 MED ORDER — MIDAZOLAM HCL 2 MG/2ML IJ SOLN
INTRAMUSCULAR | Status: AC | PRN
  Administered 2022-10-03: 1 mg via INTRAVENOUS
  Administered 2022-10-03: .5 mg via INTRAVENOUS

## 2022-10-03 MED ORDER — SODIUM CHLORIDE 0.9 % IV SOLN
INTRAVENOUS | Status: AC | PRN
  Administered 2022-10-03: 500 mL via INTRAVENOUS

## 2022-10-03 NOTE — Progress Notes (Signed)
Patient arrived to unit with transport. The patient was stopped as she was coming down the hallway. She was completely dressed and stated she was leaving. The unit secretary instructed the patient to return to room and that her nurse will be right down. I went down to the room the patient had taken off the pulse ox and removed the IV from from her arm. Patient refused to allow me to take a set of vital signs, assess procedural  site  or to touch her.  Patient repeatedly stated she didn't need to stay, she felt fine and she had a ride home. I informed patient that she had received versed, fentanyl and diphenhydramine which all are medication that can affect her ability to make decisions. Dr. Arcelia Jew paged and informed of patient decision to leave AMA.

## 2022-10-03 NOTE — Progress Notes (Signed)
Patient stated that she does not have anyone to stay with her, but she does have Manuela Schwartz (Friend) that is driving her home. This RN spoke with Manuela Schwartz (Friend) and she verified that she would be staying with the patient overnight.

## 2022-10-03 NOTE — Procedures (Signed)
Interventional Radiology Procedure:   Indications: Left upper quadrant pain and intra-abdominal abscess  Procedure: CT and US guided aspiration of left upper abdominal abscess  Findings: Complex fluid collection in left upper abdomen near liver and spleen.  Placed Yueh catheter and removed 180 ml of yellow / bloody purulent looking fluid.  Fluid collections were decompressed after aspiration.  Complications: No immediate complications noted.     EBL: Minimal  Plan: Send fluid for culture.  Discharge to home and follow up with General Surgery.     Daymein Nunnery R. Anselm Pancoast, MD  Pager: [phone]

## 2022-10-08 LAB — AEROBIC/ANAEROBIC CULTURE W GRAM STAIN (SURGICAL/DEEP WOUND)

## 2022-10-14 ENCOUNTER — Telehealth (HOSPITAL_COMMUNITY): Payer: Self-pay

## 2022-10-14 ENCOUNTER — Other Ambulatory Visit (HOSPITAL_COMMUNITY): Payer: Self-pay | Admitting: Diagnostic Radiology

## 2022-10-14 ENCOUNTER — Other Ambulatory Visit (HOSPITAL_COMMUNITY): Payer: Self-pay | Admitting: Surgery

## 2022-10-14 DIAGNOSIS — K651 Peritoneal abscess: Secondary | ICD-10-CM

## 2022-10-14 NOTE — Telephone Encounter (Signed)
-----   Message from Markus Daft, MD sent at 10/14/2022 11:32 AM EST -----  ----- Message ----- From: Markus Daft, MD Sent: 10/14/2022   8:59 AM EST To: Joanell Rising  Are we able to get her in for follow up CT and possible drain placement?  Thanks,  Quita Skye

## 2022-10-14 NOTE — Telephone Encounter (Signed)
Called to schedule ct abd/pelvis, no answer, left vm. AB

## 2022-10-18 ENCOUNTER — Encounter (HOSPITAL_COMMUNITY): Payer: Self-pay

## 2022-10-18 ENCOUNTER — Other Ambulatory Visit (HOSPITAL_COMMUNITY): Payer: Self-pay | Admitting: Interventional Radiology

## 2022-10-18 ENCOUNTER — Ambulatory Visit (HOSPITAL_COMMUNITY)
Admission: RE | Admit: 2022-10-18 | Discharge: 2022-10-18 | Disposition: A | Discharge status: Home / Self Care | Payer: 59 | Source: Ambulatory Visit | Attending: Interventional Radiology | Admitting: Interventional Radiology

## 2022-10-18 ENCOUNTER — Ambulatory Visit
Admission: RE | Admit: 2022-10-18 | Discharge: 2022-10-18 | Disposition: A | Discharge status: Home / Self Care | Payer: 59 | Source: Ambulatory Visit | Attending: Diagnostic Radiology | Admitting: Diagnostic Radiology

## 2022-10-18 ENCOUNTER — Other Ambulatory Visit: Payer: Self-pay | Admitting: Surgery

## 2022-10-18 DIAGNOSIS — T8143XA Infection following a procedure, organ and space surgical site, initial encounter: Secondary | ICD-10-CM

## 2022-10-18 DIAGNOSIS — K9189 Other postprocedural complications and disorders of digestive system: Secondary | ICD-10-CM

## 2022-10-18 DIAGNOSIS — L0291 Cutaneous abscess, unspecified: Secondary | ICD-10-CM | POA: Diagnosis not present

## 2022-10-18 DIAGNOSIS — K651 Peritoneal abscess: Secondary | ICD-10-CM

## 2022-10-18 DIAGNOSIS — Y839 Surgical procedure, unspecified as the cause of abnormal reaction of the patient, or of later complication, without mention of misadventure at the time of the procedure: Secondary | ICD-10-CM | POA: Diagnosis not present

## 2022-10-18 MED ORDER — LIDOCAINE HCL (PF) 1 % IJ SOLN
10.0000 mL | Freq: Once | INTRAMUSCULAR | Status: AC
  Administered 2022-10-18: 10 mL via INTRADERMAL

## 2022-10-18 MED ORDER — SULFAMETHOXAZOLE-TRIMETHOPRIM 800-160 MG PO TABS: 1.0000 | ORAL_TABLET | Freq: Two times a day (BID) | ORAL | 0 refills | Status: AC

## 2022-10-18 MED ORDER — IOPAMIDOL (ISOVUE-300) INJECTION 61%
100.0000 mL | Freq: Once | INTRAVENOUS | Status: AC | PRN
  Administered 2022-10-18: 100 mL via INTRAVENOUS

## 2022-10-18 NOTE — H&P (Signed)
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 reflecting reactive ileus. 4. Hepatic steatosis. 5. Early filling and prominence of the bilateral gonadal veins and pelvic collateral vessels, which can be seen in the setting of pelvic congestion syndrome. 6. Moderate volume of formed stool throughout the colon. 7.  Aortic Atherosclerosis (ICD10-I70.0). These results will be called to the ordering clinician or representative by the Radiologist Assistant, and communication documented in the PACS or [employer]. Electronically Signed   By: Dahlia Bailiff M.D.   On: 09/25/2022 16:02    Labs:  CBC: Recent Labs    10/24/21 0325 02/28/22 1234 02/28/22 1235 04/30/22 0903 10/03/22 0714  WBC 11.1* 9.2  --  7.7 14.6*  HGB 8.5* 14.9  --  15.0 13.7  HCT 26.5* 45.4 43.9 44.3 40.2  PLT 514* 252  --  270 594*    COAGS: Recent Labs    10/03/22 0714  INR 1.1    BMP: Recent Labs    10/22/21 0520 10/22/21 1801 10/23/21 0500 10/24/21 0325  NA 135 134* 136 135  K 3.6 4.1 3.9 4.5  CL 107 107 107 104  CO2 '22 22 25 25  '$ GLUCOSE 120* 103* 137* 80  BUN '7 8 9 9  '$ CALCIUM 7.9* 7.9* 8.0* 7.9*  CREATININE  0.93 0.93 0.80 0.75  GFRNONAA >60 >60 >60 >60    LIVER FUNCTION TESTS: Recent Labs    10/22/21 0520 10/22/21 1801 10/23/21 0500 10/24/21 0325  BILITOT 0.3 0.5 0.2* 0.4  AST 13* 14* 13* 11*  ALT '10 9 8 8  '$ ALKPHOS 53 56 52 50  PROT 6.4* 6.8 6.5 6.8  ALBUMIN 1.8* 1.9* 1.7* 1.7*    TUMOR MARKERS: No results for input(s): "AFPTM", "  CEA", "CA199", "CHROMGRNA" in the last 8760 hours.  Assessment and Plan:  Intra-abdominal abscesses: [name], 57 year old female, presents today to the [facility] Interventional Radiology department for an image-guided intra-abdominal fluid collection aspiration with possible drain placement. Procedure will be done with local anesthesia only. IR spoke with Dr. Thermon Leyland and discussed today's findings. Dr. Thermon Leyland recommended the patient continue antibiotics and requested we order Bactrim DS 800/160 mg BID x 14 days. This has been ordered. Dr. Anselm Pancoast will plan to see the patient back in the outpatient clinic in 2 weeks with repeat imaging. A scheduler from our office will call her with a date/time of her appointment.    Risks and benefits discussed with the patient including bleeding, infection, damage to adjacent structures, bowel perforation/fistula connection, and sepsis.  All of the patient's questions were answered, patient is agreeable to proceed. Patient is a full code.   Consent signed and in chart.   Thank you for this interesting consult.  I greatly enjoyed meeting [name] and look forward to participating in their care.  A copy of this report was sent to the requesting provider on this date.  Electronically Signed: Soyla Dryer, AGACNP-BC [phone] 10/18/2022, 2:55 PM   I spent a total of  30 Minutes   in face to face in clinical consultation, greater than 50% of which was counseling/coordinating care for intra-abdominal fluid collection aspiration with possible drain placement.
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
## 2022-10-21 ENCOUNTER — Telehealth: Payer: Self-pay | Admitting: Radiology

## 2022-10-21 NOTE — Telephone Encounter (Addendum)
  Julie Williams is a 57 y.o. female with PMH significant for ETOH use, AKI, ARF with hypoxemia, dependency on pain medication, HTN, fecal peritonitis, hepatic abscess, SBO, intra-abdominal abscess and tobacco use. She was evaluated by Dr. Thermon Leyland, [facility] surgery for left upper quadrant pain.  CT AP 09/25/2022 showed thick-walled multi loculated fluid collection in left upper quadrant with overlying peritoneal enhancement that measures approximately 10.6 x 2.7 x 9.2 cm.  Imaging states fluid collections correspond to previous studies but with an increase in size with findings concerning for abscess. Patient was referred to IR by Dr. Thermon Leyland for intra-abdominal abscess drain placement.IR placed an US guided abscess drain on 2.23.24 ( 10 Fr) with return of purulent fluid. Cultures grew. MODERATE ENTEROBACTER CLOACAE  RARE CANDIDA ALBICANS  RARE STREPTOCOCCUS ANGINOSIS  NO ANAEROBES ISOLATED    Patient called IR reporting a knot superior to the drain exit site. Patient states that the knot was why the drain was placed initially. Drain  itself is functional however the only output has been the daily normal saline flush. She denies any leaking around the drain exit site and states the dressing site is clean. She is reporting fevers that she has fevers 99.1-100 with chills, nausea and vomiting.  States that her "abdomen is filling up". Patient states that she has taken 2  doses of bactrim. Patient scheduled for drain follow up on 3.6.24.  Case discussed with IR Attending Dr. Ruthann Cancer. Recommend the Patient follow up Dr. Lalla Brothers.

## 2022-10-23 ENCOUNTER — Ambulatory Visit: Payer: Self-pay | Admitting: *Deleted

## 2022-10-23 NOTE — Telephone Encounter (Signed)
Wants to know if she is contagious   Pt has questions about MRSA, wants to know if its contagious? She is on antibiotics.      Chief Complaint: Question Symptoms: Have MRSA, am I contagious? Frequency: Months after Abdominal surgery Pertinent Negatives: Patient denies  Disposition: '[]'$ ED /'[]'$ Urgent Care (no appt availability in office) / '[]'$ Appointment(In office/virtual)/ '[]'$  [place] [facility]/ '[]'$ Home Care/ '[]'$ Refused Recommended Disposition /'[]'$ [place]/ '[x]'$  Follow-up with PCP Additional Notes:  Pt asking if contagious from MRSA. Advised to contact PCP or surgeon for additional information.  Reason for Disposition  [1] Caller requesting NON-URGENT health information AND [2] PCP's office is the best resource  Answer Assessment - Initial Assessment Questions 1. REASON FOR CALL or QUESTION: "What is your reason for calling today?" or "How can I best help you?" or "What question do you have that I can help answer?"     MRSA  Protocols used: Information Only Call - No Triage-A-AH
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
## 2022-10-29 ENCOUNTER — Ambulatory Visit
Admission: RE | Admit: 2022-10-29 | Discharge: 2022-10-29 | Disposition: A | Discharge status: Home / Self Care | Payer: 59 | Source: Ambulatory Visit | Attending: Surgery | Admitting: Surgery

## 2022-10-29 ENCOUNTER — Ambulatory Visit
Admission: RE | Admit: 2022-10-29 | Discharge: 2022-10-29 | Disposition: A | Discharge status: Home / Self Care | Payer: 59 | Source: Ambulatory Visit | Attending: Student | Admitting: Student

## 2022-10-29 DIAGNOSIS — K9189 Other postprocedural complications and disorders of digestive system: Secondary | ICD-10-CM

## 2022-10-29 MED ORDER — IOPAMIDOL (ISOVUE-300) INJECTION 61%
80.0000 mL | Freq: Once | INTRAVENOUS | Status: AC | PRN
  Administered 2022-10-29: 80 mL via INTRAVENOUS

## 2022-10-29 NOTE — Progress Notes (Signed)
Patient ID: Julie Williams, female   DOB: Jul 26, 1966, 57 y.o.   MRN: [id number] I reviewed the history and physical examination and have formulated the assessment and plan in the presence of the patient.  Assessment and Plan:  Resolution of the left lower quadrant abdominal wall/peritoneal abscess by CT.  Overall she is improved clinically.  She remains on antibiotics as an outpatient.  No new abdominal or pelvic fluid collections.  Plan for drain catheter removal today.  She has outpatient surgical follow-up later this month.  A copy of this report was sent to the requesting provider on this date.  Electronically Signed: Greggory Keen 10/29/2022, 2:30 PM   I spent a total of 15 Minutes in face to face in clinical consultation, greater than 50% of which was counseling/coordinating care for with a left lower quadrant postop abscess drain

## 2022-10-30 ENCOUNTER — Other Ambulatory Visit: Payer: 59

## 2023-03-31 ENCOUNTER — Ambulatory Visit: Payer: 59 | Admitting: Internal Medicine

## 2023-05-05 ENCOUNTER — Other Ambulatory Visit: Payer: Self-pay | Admitting: Surgery

## 2023-05-05 DIAGNOSIS — R1032 Left lower quadrant pain: Secondary | ICD-10-CM

## 2023-05-16 ENCOUNTER — Ambulatory Visit
Admission: RE | Admit: 2023-05-16 | Discharge: 2023-05-16 | Disposition: A | Discharge status: Home / Self Care | Payer: 59 | Source: Ambulatory Visit | Attending: Surgery | Admitting: Surgery

## 2023-05-16 DIAGNOSIS — R1032 Left lower quadrant pain: Secondary | ICD-10-CM

## 2023-05-16 MED ORDER — IOPAMIDOL (ISOVUE-300) INJECTION 61%
500.0000 mL | Freq: Once | INTRAVENOUS | Status: AC | PRN
  Administered 2023-05-16: 100 mL via INTRAVENOUS

## 2023-05-30 ENCOUNTER — Other Ambulatory Visit: Payer: Self-pay | Admitting: Surgery

## 2023-05-30 DIAGNOSIS — K5790 Diverticulosis of intestine, part unspecified, without perforation or abscess without bleeding: Secondary | ICD-10-CM

## 2023-06-09 ENCOUNTER — Other Ambulatory Visit: Payer: Self-pay | Admitting: Surgery

## 2023-06-09 DIAGNOSIS — N839 Noninflammatory disorder of ovary, fallopian tube and broad ligament, unspecified: Secondary | ICD-10-CM

## 2023-06-09 DIAGNOSIS — K5790 Diverticulosis of intestine, part unspecified, without perforation or abscess without bleeding: Secondary | ICD-10-CM

## 2023-06-16 ENCOUNTER — Ambulatory Visit
Admission: RE | Admit: 2023-06-16 | Discharge: 2023-06-16 | Disposition: A | Discharge status: Home / Self Care | Payer: 59 | Source: Ambulatory Visit | Attending: Surgery

## 2023-06-16 DIAGNOSIS — N839 Noninflammatory disorder of ovary, fallopian tube and broad ligament, unspecified: Secondary | ICD-10-CM

## 2023-06-16 DIAGNOSIS — K5790 Diverticulosis of intestine, part unspecified, without perforation or abscess without bleeding: Secondary | ICD-10-CM

## 2023-06-30 ENCOUNTER — Ambulatory Visit
Admission: RE | Admit: 2023-06-30 | Discharge: 2023-06-30 | Disposition: A | Discharge status: Home / Self Care | Payer: 59 | Source: Ambulatory Visit | Attending: Surgery | Admitting: Surgery

## 2023-06-30 DIAGNOSIS — K5790 Diverticulosis of intestine, part unspecified, without perforation or abscess without bleeding: Secondary | ICD-10-CM

## 2023-06-30 MED ORDER — IOPAMIDOL (ISOVUE-300) INJECTION 61%
200.0000 mL | Freq: Once | INTRAVENOUS | Status: AC | PRN
  Administered 2023-06-30: 85 mL via INTRAVENOUS

## 2023-10-09 ENCOUNTER — Ambulatory Visit: Payer: 59 | Admitting: Physician Assistant

## 2023-10-09 ENCOUNTER — Telehealth: Payer: Self-pay | Admitting: Gastroenterology

## 2023-10-09 NOTE — Telephone Encounter (Signed)
Inbound call from patient regarding appointment scheduled for today 2.13. Advised patient provider had personal emergency and would not be in. Offered patient appointment for tomorrow 2.14 patient declined appointment and was scheduled for 3.20. Patient then called back and was requested to know why she did not receive a confirmation call/text and why she was not informed yesterday about provider being out. Informed patient we were informed this morning and that is why she did not receive a call/text. Patient requested to speak with office manager, patient was transferred.

## 2023-11-06 ENCOUNTER — Telehealth: Payer: Self-pay

## 2023-11-06 NOTE — Telephone Encounter (Signed)
 Called the patient. She is very unhappy about her appointment being with an APP. She seems very agitated. Expresses frustration about waiting so long to get an appointment.  Appointment scheduled for 11/12/23 with Dr Adela Lank.

## 2023-11-12 ENCOUNTER — Other Ambulatory Visit (INDEPENDENT_AMBULATORY_CARE_PROVIDER_SITE_OTHER)

## 2023-11-12 ENCOUNTER — Encounter: Payer: Self-pay | Admitting: Gastroenterology

## 2023-11-12 ENCOUNTER — Ambulatory Visit: Admitting: Gastroenterology

## 2023-11-12 VITALS — BP 100/60 | HR 69 | Ht 65.5 in | Wt 125.0 lb

## 2023-11-12 DIAGNOSIS — K838 Other specified diseases of biliary tract: Secondary | ICD-10-CM

## 2023-11-12 DIAGNOSIS — K760 Fatty (change of) liver, not elsewhere classified: Secondary | ICD-10-CM

## 2023-11-12 DIAGNOSIS — R194 Change in bowel habit: Secondary | ICD-10-CM

## 2023-11-12 DIAGNOSIS — R1032 Left lower quadrant pain: Secondary | ICD-10-CM | POA: Diagnosis not present

## 2023-11-12 DIAGNOSIS — R933 Abnormal findings on diagnostic imaging of other parts of digestive tract: Secondary | ICD-10-CM | POA: Diagnosis not present

## 2023-11-12 LAB — HEPATIC FUNCTION PANEL
ALT: 13 U/L (ref 0–35)
AST: 18 U/L (ref 0–37)
Albumin: 4.5 g/dL (ref 3.5–5.2)
Alkaline Phosphatase: 85 U/L (ref 39–117)
Bilirubin, Direct: 0.1 mg/dL (ref 0.0–0.3)
Total Bilirubin: 0.9 mg/dL (ref 0.2–1.2)
Total Protein: 7.4 g/dL (ref 6.0–8.3)

## 2023-11-12 MED ORDER — CITRUCEL PO POWD: 1.0000 | Freq: Every day | ORAL | Status: DC

## 2023-11-12 MED ORDER — IBGARD 90 MG PO CPCR: ORAL_CAPSULE | ORAL | Status: DC
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
## 2023-11-12 NOTE — Patient Instructions (Signed)
 You have been scheduled for a flexible sigmoidoscopy. Please follow the written instructions given to you at your visit today.  If you use inhalers (even only as needed), please bring them with you on the day of your procedure.  DO NOT TAKE 7 DAYS PRIOR TO TEST- Trulicity (dulaglutide) Ozempic, Wegovy (semaglutide) Mounjaro (tirzepatide) Bydureon Bcise (exanatide extended release)  DO NOT TAKE 1 DAY PRIOR TO YOUR TEST Rybelsus (semaglutide) Adlyxin (lixisenatide) Victoza (liraglutide) Byetta (exanatide) ___________________________________________________________________________   Please purchase the following medications over the counter and take as directed: Citrucel- take once daily (Can you fiber gummies instead if preferable)  Please purchase the following medications over the counter and take as directed: IBgard - Take as directed  Stop Ibuprofen  Please go to the lab in the basement of our building to have lab work done as you leave today. Hit "B" for basement when you get on the elevator.  When the doors open the lab is on your left.  We will call you with the results. Thank you.   Thank you for entrusting me with your care and for choosing [facility], Dr. Ileene Patrick    If your blood pressure at your visit was 140/90 or greater, please contact your primary care physician to follow up on this. ______________________________________________________  If you are age 60 or older, your body mass index should be between 23-30. Your Body mass index is 20.48 kg/m. If this is out of the aforementioned range listed, please consider follow up with your Primary Care Provider.  If you are age 24 or younger, your body mass index should be between 19-25. Your Body mass index is 20.48 kg/m. If this is out of the aformentioned range listed, please consider follow up with your Primary Care Provider.  ________________________________________________________  The [facility]  [facility] providers would like to encourage you to use [facility] to communicate with providers for non-urgent requests or questions.  Due to long hold times on the telephone, sending your provider a message by [facility] may be a faster and more efficient way to get a response.  Please allow 48 business hours for a response.  Please remember that this is for non-urgent requests.  _______________________________________________________  Due to recent changes in healthcare laws, you may see the results of your imaging and laboratory studies on MyChart before your provider has had a chance to review them.  We understand that in some cases there may be results that are confusing or concerning to you. Not all laboratory results come back in the same time frame and the provider may be waiting for multiple results in order to interpret others.  Please give [place] 48 hours in order for your provider to thoroughly review all the results before contacting the office for clarification of your results.

## 2023-11-13 ENCOUNTER — Encounter: Payer: Self-pay | Admitting: Gastroenterology

## 2023-11-13 ENCOUNTER — Ambulatory Visit (AMBULATORY_SURGERY_CENTER): Admitting: Gastroenterology

## 2023-11-13 ENCOUNTER — Ambulatory Visit: Payer: 59 | Admitting: Gastroenterology

## 2023-11-13 VITALS — BP 105/73 | HR 76 | Temp 97.9°F | Resp 14 | Ht 65.5 in | Wt 125.0 lb

## 2023-11-13 DIAGNOSIS — K648 Other hemorrhoids: Secondary | ICD-10-CM | POA: Diagnosis not present

## 2023-11-13 DIAGNOSIS — R933 Abnormal findings on diagnostic imaging of other parts of digestive tract: Secondary | ICD-10-CM

## 2023-11-13 DIAGNOSIS — Z0389 Encounter for observation for other suspected diseases and conditions ruled out: Secondary | ICD-10-CM

## 2023-11-13 MED ORDER — SODIUM CHLORIDE 0.9 % IV SOLN: 500.0000 mL | Freq: Once | INTRAVENOUS | Status: DC

## 2023-11-13 NOTE — Progress Notes (Signed)
 History and Physical Interval Note: Seen yesterday in the office - no interval changes. Flex sig to evaluate abnormal CT scan - inflammation of distal left colon. Exam to be done awake.   11/13/2023 9:21 AM  Julie Williams  has presented today for endoscopic procedure(s), with the diagnosis of  Encounter Diagnosis  Name Primary?   Abnormal CT scan, gastrointestinal tract Yes  .  The various methods of evaluation and treatment have been discussed with the patient and/or family. After consideration of risks, benefits and other options for treatment, the patient has consented to  the endoscopic procedure(s).   The patient's history has been reviewed, patient examined, no change in status, stable for surgery.  I have reviewed the patient's chart and labs.  Questions were answered to the patient's satisfaction.    Harlin Rain, MD [facility]astroenterology

## 2023-11-13 NOTE — Patient Instructions (Signed)

## 2023-11-13 NOTE — Progress Notes (Signed)
 Pt's states no medical or surgical changes since previsit or office visit.

## 2023-11-13 NOTE — Progress Notes (Signed)
 Pt requested No sedation for this procedure.tb

## 2023-11-13 NOTE — Op Note (Signed)
  [facility] Patient Name: Julie Williams Procedure Date: 11/13/2023 9:23 AM MRN: [id number] Endoscopist: Viviann Spare P. Adela Lank , MD, [id number] Age: 58 Referring MD:  Date of Birth: 1966/02/26 Gender: Female Account #: [id number] Procedure:                Flexible Sigmoidoscopy Indications:              Abnormal CT of the GI tract - inflammation of                            distal left colon on CT scan in October / November                            2024. Altered bowel habits - rule out IBD Medicines:                None Procedure:                Pre-Anesthesia Assessment:                           - Prior to the procedure, a History and Physical                            was performed, and patient medications and                            allergies were reviewed. The patient's tolerance of                            previous anesthesia was also reviewed. The risks                            and benefits of the procedure and the sedation                            options and risks were discussed with the patient.                            All questions were answered, and informed consent                            was obtained. Prior Anticoagulants: The patient has                            taken no anticoagulant or antiplatelet agents. ASA                            Grade Assessment: II - A patient with mild systemic                            disease. After reviewing the risks and benefits,                            the patient was deemed in satisfactory condition to  undergo the procedure.                           After obtaining informed consent, the scope was                            passed under direct vision. The Olympus Scope                            [id number] was introduced through the anus and                            advanced to the the sigmoid colon. The flexible                            sigmoidoscopy was accomplished  without difficulty.                            The patient tolerated the procedure well. The                            quality of the bowel preparation was adequate. Scope In: Scope Out: Findings:                 The perianal and digital rectal examinations were                            normal.                           There was evidence of a prior end-to-end                            colo-colonic anastomosis in the recto-sigmoid                            colon. This was patent and was characterized by                            healthy appearing mucosa.                           Internal hemorrhoids were found during                            retroflexion. The hemorrhoids were small.                           There was residual liquid stool coating the rectum,                            recto-sigmoid colon and distal sigmoid colon. This                            was able to be lavaged to visualize underlying  colonic mucosa, which appeared healthy and without                            any obvious inflammation. Biopsies for histology                            were taken with a cold forceps for evaluation of                            microscopic colitis. Complications:            No immediate complications. Estimated blood loss:                            Minimal. Estimated Blood Loss:     Estimated blood loss was minimal. Impression:               - Patent end-to-end colo-colonic anastomosis,                            characterized by healthy appearing mucosa.                           - Internal hemorrhoids.                           - The rectum, recto-sigmoid colon and distal                            sigmoid colon are normal of what was visualized.                            Biopsied.                           No obvious evidence of persistent inflammation.                            Biopsies taken. It's possible the patient had                             infectious colitis at the time, now has                            post-infectious functional change. Recommendation:           - Discharge patient to home.                           - Advance diet as tolerated.                           - Continue present medications.                           - Await pathology results with further  recommendations.                           - Citrucel daily as recommended in office note                           - IB gard PRN (samples given yesterday) Viviann Spare P. Adela Lank, MD 11/13/2023 9:45:53 AM This report has been signed electronically.

## 2023-11-13 NOTE — Progress Notes (Signed)
 Called to room to assist during endoscopic procedure.  Patient ID and intended procedure confirmed with present staff. Received instructions for my participation in the procedure from the performing physician.

## 2023-11-14 ENCOUNTER — Telehealth: Payer: Self-pay

## 2023-11-14 NOTE — Telephone Encounter (Signed)
  Follow up Call-     11/13/2023    8:28 AM  Call back number  Post procedure Call Back phone  # [phone]  Permission to leave phone message Yes     Attempted to call patient regarding follow-up phone call. No answer, VM left.

## 2023-11-17 LAB — SURGICAL PATHOLOGY

## 2023-11-18 ENCOUNTER — Encounter: Payer: Self-pay | Admitting: Gastroenterology

## 2023-11-20 ENCOUNTER — Telehealth: Payer: Self-pay | Admitting: Gastroenterology

## 2023-11-20 NOTE — Telephone Encounter (Signed)
 Left message for patient to call back

## 2023-11-20 NOTE — Telephone Encounter (Signed)
 I have spoken to patient and advised a letter was sent to her home address on 11/18/23 so she should be getting this in the mail shortly. I have read Dr Lanetta Inch letter to her. She verbalizes understanding.  In addition, patient requests to know about her recent LFT labs. I advised that Dr Adela Lank says these are normal but he does continue to recommend MRCP. Patient questions why she needs MRCP when all liver function tests are okay and imaging has shown the liver to be normal. I advised that there was biliary ductal dilation seen on 06/29/24 CT scan. MCRP is often recommended to take a better look at the bile ducts/pancreas to rule out obstructive reasons for the dilation. Patient states that she just doesn't see the need for this with normal tests and does not want to continue paying for testing that is showing nothing. I offered to have Dr Adela Lank explain his reasoning for testing to hopefully help her understand better, but she tells me she does not want me to do this since "he is a busy man." States she appreciates his help thus far.

## 2023-11-20 NOTE — Telephone Encounter (Signed)
 Patient called to get path report.

## 2023-11-20 NOTE — Telephone Encounter (Signed)
 Thanks Dottie - I did address this with her at office visit before. I understand she financially has concerns about this. May be best to have her come back to the office in 3-4 months to reassess her and can discuss further at that time. Thanks

## 2023-11-26 ENCOUNTER — Encounter: Payer: 59 | Admitting: Obstetrics and Gynecology

## 2024-01-15 ENCOUNTER — Telehealth: Payer: Self-pay

## 2024-01-15 NOTE — Telephone Encounter (Signed)
Called patient back but had to leave a message.

## 2024-01-15 NOTE — Telephone Encounter (Signed)
 Called and left detailed message for patient. Asked her to call back to let us  know if she would like to have another MRCP at this time or if she would prefer to wait. She and Dr. General Kenner dicsussed at length at her March 2025 appointment and she wanted wait at that time. Need her to let us  know how she would like to proceed

## 2024-01-15 NOTE — Telephone Encounter (Signed)
-----   Message from [facility] sent at 11/13/2023  1:28 PM EDT ----- Regarding: FW: MRCP  ----- Message ----- From: Alwyn Baas, CMA Sent: 11/13/2023  11:26 AM EDT To: Alwyn Baas, CMA Subject: MRCP                                           Contact patient to see if she is interested in having an MRCP at this time  Biliary ductal dilation - abnormal  CT

## 2024-01-15 NOTE — Telephone Encounter (Signed)
 Patient returned call

## 2024-01-20 NOTE — Telephone Encounter (Signed)
 Called patient and left another detailed message re: MRCP that is recommended for biliary ductal dilation. Read her the note from her last office visit where she and Dr. General Kenner discussed. Asked her call back and just let me know if she would like me to schedule that or not.

## 2024-01-21 NOTE — Telephone Encounter (Signed)
 PT requesting to speak about scheduling MRCP. She wants to be on the phone when we find out dates to accommodate her. She would also like to further discuss what this is exactly because she isn't sure why this is needed. Please advise.

## 2024-01-22 NOTE — Telephone Encounter (Signed)
 Called and spoke to patient. She is very frustrated with the fact that she still has left sided pain and a worsening hardness of her abdomen. She reports her hernia has grown to the size of a cantaloupe when it started as a lemon. She feels noone is really listening and just expects her to keep spending hundreds and hundreds of dollars which she does Not have since she is on a fixed income, on MRIs and tests and she is getting worse.  She feels GI and Surgery are passing her back and forth and things are not being explained in a way she can understand. (She was unaware that the inflammation in the colon noted on CT, which was evaluated by Flex Sig, is a different issue than the dilated biliary duct for which a follow up MRCP is recommended) She needs someone to really help her understand, in simpler terms, what could be causing her Sx and address the problem. She declined an office visit as that would cost her hundreds more which she doesn't have bc she has so many other bills due.  And she indicates Dr. General Kenner did not explains things in a way she understood.  She has questions about the risks of having a dilated biliary duct and what Sx it can cause and how you would treat that. She is declining MRCP at this time due to cost and she doesn't know how that would help her Sx.

## 2024-02-02 NOTE — Telephone Encounter (Signed)
 I called the patient and answer her questions. No answer. I left her a message, will try her again later

## 2024-02-09 MED ORDER — DULOXETINE HCL 30 MG PO CPEP: 30.0000 mg | ORAL_CAPSULE | Freq: Every day | ORAL | 1 refills | Status: DC

## 2024-02-09 NOTE — Addendum Note (Signed)
 Addended by: Anderson Middlebrooks M on: 02/09/2024 10:02 PM   Modules accepted: Orders

## 2024-02-09 NOTE — Telephone Encounter (Signed)
 Cymbalta 30 mg once daily #30 with 1 refill sent to pharmacy.  No medication interactions came up

## 2024-02-09 NOTE — Telephone Encounter (Signed)
 Jan -   I called and got a hold of the patient.  I spent approximately 25 minutes on the phone with her answering her questions.  She voiced significant frustration with other doctors, she feels that she is just being sent from imaging test imaging test without answers.  I previously evaluated her abnormal CT scan of her colon with a flex sig that was normal.  She continues to complain of ongoing left-sided abdominal pain that is there all the time.  She also feels swelling in her left abdomen and that can migrate and move around.  She is worried about recurrent fluid collections in her left abdomen for which she has had drainage and drains placed in the past, postoperatively.  She also had questions about why I was recommending an MRCP.  I reviewed my last clinic note with her at which time I spent an hour with her in the office discussing these issues and coordinate a flex sig for her the next day.  MRCP was recommended due to chronic bile duct dilation of unclear etiology.  I do not think this is related to her current pain.  Her LFTs are normal.  MRCP remains recommended to clear her bile duct.  She states she cannot afford imaging at this time and does not wish to pursue this.  I explained to her that hopefully nothing concerning is in that area however again MRCP is recommended to clear her duct when she feels she is able to pursue that.  In regards to her abdominal pain, I am suspicious this could be postoperative/neuropathic pain however based on how she describes her symptoms, physical exam is recommended with labs and repeat imaging.  She feels she cannot afford any further evaluation, is not willing to come to the office for exam, is not willing to come in for labs, she states she has had them done at her primary care's office I do not see anything in epic recently other than her LFTs that she had with me.  She is not willing to have any additional imaging done at this time.  I explained to  her it is difficult to evaluate abdominal pain specifically without pursuing further workup or at least coming into the office for an exam.  My suspicion is that this could be neuropathic pain however I cannot be certain of that without completing evaluation.  She inquires about how I would manage this if it was neuropathic and I would consider starting her on Cymbalta for chronic pain.  We discussed Cymbalta and she is considering starting this.  She is otherwise having chronic constipation on morphine , she is taking MiraLAX  once daily.  She will typically have a bowel movement once every few days and she takes extra laxatives every 4 days if she does not have a BM.  I recommend she take MiraLAX  twice daily if she tolerates it.   At this point in time she was thankful for the call and my impression of her case.  I certainly understand and I am sorry that she currently has symptoms but I relayed to her it is hard to evaluate these issues without being able to examine her, do labs, do imaging, etc.  I can only do so much over the phone.  At the end of the call she asked me to place her prescription for Cymbalta to see if that will help her and she will think about next steps and if she wishes to have further evaluation.  I told her Cymbalta can take a few weeks to really kick in and we give her at least a 1 month trial to start.     Jan can you help with the following: - write prescription for Cymbalta 30mg  / day for 4 weeks with a refill - let me know if any medication interactions if anything comes up with this script  thanks

## 2024-02-24 ENCOUNTER — Telehealth: Payer: Self-pay | Admitting: Gastroenterology

## 2024-02-24 NOTE — Telephone Encounter (Signed)
 Called and left detailed message on pts voicemail regarding recommendations per Elida Shawl NP.

## 2024-02-24 NOTE — Telephone Encounter (Signed)
 Inbound call from patient requesting a nurse. Patient did not want to disclose information. She did state '' something happened to her Monday night at 1:30 am and it keeps happening'.    Please advise.

## 2024-02-24 NOTE — Telephone Encounter (Signed)
 [place] pt calling stating she had terrible abd pain around 1:30am that woke her up. States the pain was located where your belly button should be, reports it felt like a spasm and like her intestines were around her bladder. States she could not pass any stool or urinate at that time. She reports she vomited a lot. This am she has been able to pass some gas. Pt states Dr Leigh had wanted to do an MRCP to evaluate her bile duct dilation. Pt is wanting to know if Dr Leigh will order a scan that will look at all of her abdomen. Asked pt to rate her pain on scale of 1-10 and she states it is at a 10. Recommended pt go to the ER if she is in that much pain to be evaluated. Pt just wants to know if she can have some sort of scan done. Please advise.

## 2024-02-24 NOTE — Telephone Encounter (Signed)
 Linda, pls contact patient and send her to the ED for evaluation to include labs and likely will require CTAP since pain is periumbilical. However, if labs indicate any biliary obstruction the ED provider will determine best image study.
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
## 2024-03-05 ENCOUNTER — Encounter: Payer: Self-pay | Admitting: Physician Assistant

## 2024-03-05 ENCOUNTER — Ambulatory Visit: Admitting: Physician Assistant

## 2024-03-05 VITALS — BP 104/70 | HR 84 | Ht 62.75 in | Wt 121.0 lb

## 2024-03-05 DIAGNOSIS — T402X5A Adverse effect of other opioids, initial encounter: Secondary | ICD-10-CM | POA: Diagnosis not present

## 2024-03-05 DIAGNOSIS — K5903 Drug induced constipation: Secondary | ICD-10-CM | POA: Diagnosis not present

## 2024-03-05 DIAGNOSIS — K5904 Chronic idiopathic constipation: Secondary | ICD-10-CM

## 2024-03-05 DIAGNOSIS — R1030 Lower abdominal pain, unspecified: Secondary | ICD-10-CM

## 2024-03-05 DIAGNOSIS — G8929 Other chronic pain: Secondary | ICD-10-CM

## 2024-03-05 DIAGNOSIS — K760 Fatty (change of) liver, not elsewhere classified: Secondary | ICD-10-CM | POA: Diagnosis not present

## 2024-03-05 DIAGNOSIS — R109 Unspecified abdominal pain: Secondary | ICD-10-CM

## 2024-03-05 DIAGNOSIS — K581 Irritable bowel syndrome with constipation: Secondary | ICD-10-CM

## 2024-03-05 DIAGNOSIS — K838 Other specified diseases of biliary tract: Secondary | ICD-10-CM

## 2024-03-05 NOTE — Patient Instructions (Addendum)
 We have given you samples of the following medication to take: Linzess 72 mcg, 145 mcg and 290 mcg  Send a MyChart message letting us  know if the Linzess is helping you.  Please follow up sooner if symptoms increase or worsen  Due to recent changes in healthcare laws, you may see the results of your imaging and laboratory studies on MyChart before your provider has had a chance to review them.  We understand that in some cases there may be results that are confusing or concerning to you. Not all laboratory results come back in the same time frame and the provider may be waiting for multiple results in order to interpret others.  Please give us  48 hours in order for your provider to thoroughly review all the results before contacting the office for clarification of your results.   Thank you for trusting me with your gastrointestinal care!   Ellouise Console, PA-C _______________________________________________________  If your blood pressure at your visit was 140/90 or greater, please contact your primary care physician to follow up on this.  _______________________________________________________  If you are age 35 or older, your body mass index should be between 23-30. Your Body mass index is 21.61 kg/m. If this is out of the aforementioned range listed, please consider follow up with your Primary Care Provider.  If you are age 26 or younger, your body mass index should be between 19-25. Your Body mass index is 21.61 kg/m. If this is out of the aformentioned range listed, please consider follow up with your Primary Care Provider.   ________________________________________________________  The [facility] providers would like to encourage you to use MYCHART to communicate with providers for non-urgent requests or questions.  Due to long hold times on the telephone, sending your provider a message by [facility] may be a faster and more efficient way to get a response.  Please allow 48 business hours  for a response.  Please remember that this is for non-urgent requests.  _______________________________________________________

## 2024-03-06 NOTE — Progress Notes (Signed)
 Agree with assessment and plan as outlined.

## 2024-03-25 ENCOUNTER — Telehealth: Payer: Self-pay | Admitting: Physician Assistant

## 2024-03-25 DIAGNOSIS — K5904 Chronic idiopathic constipation: Secondary | ICD-10-CM

## 2024-03-25 MED ORDER — LINACLOTIDE 145 MCG PO CAPS: 145.0000 ug | ORAL_CAPSULE | Freq: Every day | ORAL | 2 refills | Status: DC

## 2024-03-25 NOTE — Telephone Encounter (Signed)
 Inbound call from patient stating she would like Linzess  145 mg sent to her pharmacy. Please advise, thank you.

## 2024-04-07 ENCOUNTER — Other Ambulatory Visit: Payer: Self-pay | Admitting: Gastroenterology
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
## 2024-05-03 ENCOUNTER — Ambulatory Visit: Admitting: Gastroenterology

## 2024-05-03 ENCOUNTER — Encounter: Payer: Self-pay | Admitting: Gastroenterology

## 2024-05-03 VITALS — BP 112/76 | HR 71 | Ht 62.0 in | Wt 124.2 lb

## 2024-05-03 DIAGNOSIS — K838 Other specified diseases of biliary tract: Secondary | ICD-10-CM | POA: Diagnosis not present

## 2024-05-03 DIAGNOSIS — Z9889 Other specified postprocedural states: Secondary | ICD-10-CM

## 2024-05-03 DIAGNOSIS — K439 Ventral hernia without obstruction or gangrene: Secondary | ICD-10-CM | POA: Diagnosis not present

## 2024-05-03 DIAGNOSIS — K5904 Chronic idiopathic constipation: Secondary | ICD-10-CM

## 2024-05-03 DIAGNOSIS — R1032 Left lower quadrant pain: Secondary | ICD-10-CM

## 2024-05-03 DIAGNOSIS — F1190 Opioid use, unspecified, uncomplicated: Secondary | ICD-10-CM

## 2024-05-03 MED ORDER — DULOXETINE HCL 20 MG PO CPEP: 20.0000 mg | ORAL_CAPSULE | Freq: Every day | ORAL | 3 refills | Status: DC

## 2024-05-03 NOTE — Patient Instructions (Addendum)
 You will be contacted by [facility] formerly known [facility] to schedule an MRCP to evaluate your dilated bile duct and abdominal pain.  You can discuss costs with them when they call.  We have sent the following medications to your pharmacy for you to pick up at your convenience: Cymbalta  20 mg: Take once daily  Note: Please monitor your blood pressure while taking  We will request records of your recent labs drawn at Dr. Abram office today  Continue Linzess .   Thank you for entrusting me with your care and for choosing [facility], Dr. Elspeth Naval  _______________________________________________________  If your blood pressure at your visit was 140/90 or greater, please contact your primary care physician to follow up on this.  _______________________________________________________  If you are age 50 or older, your body mass index should be between 23-30. Your Body mass index is 22.73 kg/m. If this is out of the aforementioned range listed, please consider follow up with your Primary Care Provider.  If you are age 4 or younger, your body mass index should be between 19-25. Your Body mass index is 22.73 kg/m. If this is out of the aformentioned range listed, please consider follow up with your Primary Care Provider.   ________________________________________________________  The [facility] providers would like to encourage you to use MYCHART to communicate with providers for non-urgent requests or questions.  Due to long hold times on the telephone, sending your provider a message by [facility] may be a faster and more efficient way to get a response.  Please allow 48 business hours for a response.  Please remember that this is for non-urgent requests.  _______________________________________________________  [facility] is using a team-based approach to care.  Your team is made up of your doctor and two to three APPS. Our  APPS (Nurse Practitioners and Physician Assistants) work with your physician to ensure care continuity for you. They are fully qualified to address your health concerns and develop a treatment plan. They communicate directly with your gastroenterologist to care for you. Seeing the Advanced Practice Practitioners on your physician's team can help you by facilitating care more promptly, often allowing for earlier appointments, access to diagnostic testing, procedures, and other specialty referrals.

## 2024-05-10 ENCOUNTER — Telehealth: Payer: Self-pay | Admitting: Gastroenterology

## 2024-05-10 NOTE — Telephone Encounter (Signed)
 Okay.  To clarify the Cymbalta  - this is not an immediate effect of the medication. I told her it can take upwards of 4 weeks to show any benefit. The risks of this was causing LOW blood pressure, not high blood pressure. She can hold it and see if BP normalizes, perhaps retry it again and see if the same thing happens. If BP remains high she should let her PCP know but I would be surprised if that was due to the cymbalta , the risk of that medication is low BP, please let her know.  Yes I still recommend MRCP, I discussed this with her at length during the office visit. She had asked which site was cheapest to do the MRCP, and I did not know, staff told her they would look into it if possible but she left while the staff was working on it. I recommend she proceed with MRCP, I am not sure if there is any difference in cost in regards to where she gets it done, if you have any information for her on that.  Thanks

## 2024-05-10 NOTE — Telephone Encounter (Signed)
 Pt states that the cymbalta  has not helped with her nerve pain and it made her BP increase to 160. States she had labs with pcp and all were normal except cholesterol. She wants to know if she has to continue with MR/MRCP. Please advise.

## 2024-05-10 NOTE — Telephone Encounter (Signed)
 Inbound call from patient stating she was prescribed medication Cymbalta  and it has not been helping, patient also states its been making her blood pressure go up and would like to know if she can stop taking it. Patient states her pcp received blood work results and everything came out good except cholesterol. Patient wants to know if she has to continue with having MRCP Requesting a call back. Please advise  Thank you

## 2024-05-10 NOTE — Telephone Encounter (Signed)
 Spoke with pt and she is aware of recommendations per Dr. Leigh. She is scheduled for MRCP at [facility] imaging which is cheaper than having it done at the hospital. Pt verbalized understanding.

## 2024-05-12 ENCOUNTER — Telehealth: Payer: Self-pay | Admitting: Gastroenterology

## 2024-05-12 NOTE — Telephone Encounter (Signed)
 Labs arrived from Dr. Abram office:  05/03/24: T bil 0.6, AP 83, AST 22, ALT 14 - normal range WBC 7.1, Hgb 15.4, plt 191  Awaiting MRCP with further recommendations

## 2024-05-26 ENCOUNTER — Other Ambulatory Visit: Payer: Self-pay | Admitting: Gastroenterology

## 2024-05-31 ENCOUNTER — Ambulatory Visit
Admission: RE | Admit: 2024-05-31 | Discharge: 2024-05-31 | Disposition: A | Discharge status: Home / Self Care | Source: Ambulatory Visit | Attending: Gastroenterology | Admitting: Gastroenterology

## 2024-05-31 DIAGNOSIS — K838 Other specified diseases of biliary tract: Secondary | ICD-10-CM

## 2024-05-31 DIAGNOSIS — R1032 Left lower quadrant pain: Secondary | ICD-10-CM

## 2024-05-31 MED ORDER — GADOPICLENOL 0.5 MMOL/ML IV SOLN
6.0000 mL | Freq: Once | INTRAVENOUS | Status: AC | PRN
Start: 2024-05-31 — End: 2024-05-31
  Administered 2024-05-31: 6 mL via INTRAVENOUS

## 2024-06-02 ENCOUNTER — Ambulatory Visit: Payer: Self-pay | Admitting: Gastroenterology

## 2024-06-03 MED ORDER — LINACLOTIDE 290 MCG PO CAPS: 290.0000 ug | ORAL_CAPSULE | Freq: Every day | ORAL | 2 refills | Status: AC
